# Patient Record
Sex: Female | Born: 1955 | Hispanic: No | Marital: Single | Smoking: Former smoker
Health system: Southern US, Community
[De-identification: ages and names within clinical notes are randomized; demographics above are authoritative.]

## PROBLEM LIST (undated history)

## (undated) DIAGNOSIS — R9431 Abnormal electrocardiogram [ECG] [EKG]: Secondary | ICD-10-CM

## (undated) DIAGNOSIS — R000 Tachycardia, unspecified: Secondary | ICD-10-CM

## (undated) DIAGNOSIS — F32A Depression, unspecified: Secondary | ICD-10-CM

## (undated) DIAGNOSIS — F329 Major depressive disorder, single episode, unspecified: Secondary | ICD-10-CM

## (undated) DIAGNOSIS — F319 Bipolar disorder, unspecified: Secondary | ICD-10-CM

## (undated) DIAGNOSIS — K219 Gastro-esophageal reflux disease without esophagitis: Secondary | ICD-10-CM

## (undated) DIAGNOSIS — I2510 Atherosclerotic heart disease of native coronary artery without angina pectoris: Secondary | ICD-10-CM

## (undated) DIAGNOSIS — G4700 Insomnia, unspecified: Secondary | ICD-10-CM

## (undated) DIAGNOSIS — E669 Obesity, unspecified: Secondary | ICD-10-CM

## (undated) DIAGNOSIS — M1990 Unspecified osteoarthritis, unspecified site: Secondary | ICD-10-CM

## (undated) DIAGNOSIS — J45909 Unspecified asthma, uncomplicated: Secondary | ICD-10-CM

## (undated) DIAGNOSIS — M7989 Other specified soft tissue disorders: Secondary | ICD-10-CM

## (undated) DIAGNOSIS — T7840XA Allergy, unspecified, initial encounter: Secondary | ICD-10-CM

## (undated) DIAGNOSIS — K8020 Calculus of gallbladder without cholecystitis without obstruction: Secondary | ICD-10-CM

## (undated) DIAGNOSIS — J449 Chronic obstructive pulmonary disease, unspecified: Secondary | ICD-10-CM

## (undated) DIAGNOSIS — M810 Age-related osteoporosis without current pathological fracture: Secondary | ICD-10-CM

## (undated) DIAGNOSIS — R011 Cardiac murmur, unspecified: Secondary | ICD-10-CM

## (undated) DIAGNOSIS — E785 Hyperlipidemia, unspecified: Secondary | ICD-10-CM

## (undated) DIAGNOSIS — M79673 Pain in unspecified foot: Secondary | ICD-10-CM

## (undated) DIAGNOSIS — F419 Anxiety disorder, unspecified: Secondary | ICD-10-CM

## (undated) DIAGNOSIS — D689 Coagulation defect, unspecified: Secondary | ICD-10-CM

## (undated) HISTORY — DX: Major depressive disorder, single episode, unspecified: F32.9

## (undated) HISTORY — DX: Cardiac murmur, unspecified: R01.1

## (undated) HISTORY — DX: Age-related osteoporosis without current pathological fracture: M81.0

## (undated) HISTORY — DX: Calculus of gallbladder without cholecystitis without obstruction: K80.20

## (undated) HISTORY — DX: Insomnia, unspecified: G47.00

## (undated) HISTORY — DX: Unspecified osteoarthritis, unspecified site: M19.90

## (undated) HISTORY — DX: Allergy, unspecified, initial encounter: T78.40XA

## (undated) HISTORY — PX: ULNAR NERVE REPAIR: SHX2594

## (undated) HISTORY — DX: Pain in unspecified foot: M79.673

## (undated) HISTORY — DX: Anxiety disorder, unspecified: F41.9

## (undated) HISTORY — DX: Obesity, unspecified: E66.9

## (undated) HISTORY — DX: Bipolar disorder, unspecified: F31.9

## (undated) HISTORY — DX: Other specified soft tissue disorders: M79.89

## (undated) HISTORY — PX: CHOLECYSTECTOMY: SHX55

## (undated) HISTORY — DX: Tachycardia, unspecified: R00.0

## (undated) HISTORY — PX: CARPAL TUNNEL RELEASE: SHX101

## (undated) HISTORY — DX: Depression, unspecified: F32.A

## (undated) HISTORY — DX: Abnormal electrocardiogram (ECG) (EKG): R94.31

## (undated) HISTORY — PX: ENDOMETRIAL ABLATION: SHX621

## (undated) HISTORY — DX: Gastro-esophageal reflux disease without esophagitis: K21.9

## (undated) HISTORY — PX: KNEE SURGERY: SHX244

## (undated) HISTORY — DX: Hyperlipidemia, unspecified: E78.5

## (undated) HISTORY — DX: Coagulation defect, unspecified: D68.9

## (undated) HISTORY — DX: Unspecified asthma, uncomplicated: J45.909

## (undated) HISTORY — PX: UPPER GASTROINTESTINAL ENDOSCOPY: SHX188

## (undated) HISTORY — PX: FOOT SURGERY: SHX648

## (undated) HISTORY — PX: LAPAROSCOPY: SHX197

## (undated) HISTORY — PX: TUBAL LIGATION: SHX77

## (undated) HISTORY — PX: BREAST BIOPSY: SHX20

## (undated) HISTORY — DX: Atherosclerotic heart disease of native coronary artery without angina pectoris: I25.10

## (undated) HISTORY — PX: COLONOSCOPY: SHX174

---
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
## 1998-08-25 ENCOUNTER — Other Ambulatory Visit: Admission: RE | Admit: 1998-08-25 | Discharge: 1998-08-25 | Payer: Self-pay | Admitting: Gynecology

## 1999-07-05 ENCOUNTER — Encounter: Admission: RE | Admit: 1999-07-05 | Discharge: 1999-07-05 | Payer: Self-pay | Admitting: Family Medicine

## 1999-07-05 ENCOUNTER — Encounter: Payer: Self-pay | Admitting: Family Medicine

## 2000-09-18 ENCOUNTER — Encounter: Payer: Self-pay | Admitting: Family Medicine

## 2000-09-18 ENCOUNTER — Other Ambulatory Visit: Admission: RE | Admit: 2000-09-18 | Discharge: 2000-09-18 | Payer: Self-pay | Admitting: Gynecology

## 2000-09-18 ENCOUNTER — Encounter: Admission: RE | Admit: 2000-09-18 | Discharge: 2000-09-18 | Payer: Self-pay | Admitting: Family Medicine

## 2000-11-25 ENCOUNTER — Encounter: Admission: RE | Admit: 2000-11-25 | Discharge: 2000-11-25 | Payer: Self-pay | Admitting: Family Medicine

## 2000-11-25 ENCOUNTER — Encounter: Payer: Self-pay | Admitting: Family Medicine

## 2000-12-21 ENCOUNTER — Emergency Department (HOSPITAL_COMMUNITY): Admission: EM | Admit: 2000-12-21 | Discharge: 2000-12-21 | Payer: Self-pay

## 2001-01-06 ENCOUNTER — Ambulatory Visit (HOSPITAL_COMMUNITY): Admission: RE | Admit: 2001-01-06 | Discharge: 2001-01-06 | Payer: Self-pay | Admitting: Pulmonary Disease

## 2001-01-06 ENCOUNTER — Encounter: Payer: Self-pay | Admitting: Pulmonary Disease

## 2001-01-16 ENCOUNTER — Encounter: Admission: RE | Admit: 2001-01-16 | Discharge: 2001-01-16 | Payer: Self-pay | Admitting: Family Medicine

## 2001-01-16 ENCOUNTER — Encounter: Payer: Self-pay | Admitting: Family Medicine

## 2001-01-17 ENCOUNTER — Encounter: Payer: Self-pay | Admitting: Family Medicine

## 2001-01-17 ENCOUNTER — Encounter: Admission: RE | Admit: 2001-01-17 | Discharge: 2001-01-17 | Payer: Self-pay | Admitting: Family Medicine

## 2001-01-31 ENCOUNTER — Encounter (INDEPENDENT_AMBULATORY_CARE_PROVIDER_SITE_OTHER): Payer: Self-pay | Admitting: *Deleted

## 2001-01-31 ENCOUNTER — Ambulatory Visit (HOSPITAL_COMMUNITY): Admission: RE | Admit: 2001-01-31 | Discharge: 2001-02-01 | Payer: Self-pay | Admitting: General Surgery

## 2001-12-09 ENCOUNTER — Encounter: Payer: Self-pay | Admitting: Family Medicine

## 2001-12-09 ENCOUNTER — Encounter: Admission: RE | Admit: 2001-12-09 | Discharge: 2001-12-09 | Payer: Self-pay | Admitting: Family Medicine

## 2001-12-29 ENCOUNTER — Encounter: Payer: Self-pay | Admitting: Pulmonary Disease

## 2002-03-24 ENCOUNTER — Encounter: Payer: Self-pay | Admitting: Family Medicine

## 2002-03-24 ENCOUNTER — Encounter: Admission: RE | Admit: 2002-03-24 | Discharge: 2002-03-24 | Payer: Self-pay | Admitting: Family Medicine

## 2002-04-17 ENCOUNTER — Ambulatory Visit (HOSPITAL_COMMUNITY): Admission: RE | Admit: 2002-04-17 | Discharge: 2002-04-17 | Payer: Self-pay | Admitting: Pulmonary Disease

## 2002-04-17 ENCOUNTER — Encounter: Payer: Self-pay | Admitting: Pulmonary Disease

## 2002-05-01 ENCOUNTER — Ambulatory Visit (HOSPITAL_COMMUNITY): Admission: RE | Admit: 2002-05-01 | Discharge: 2002-05-01 | Payer: Self-pay | Admitting: *Deleted

## 2002-05-01 ENCOUNTER — Encounter (INDEPENDENT_AMBULATORY_CARE_PROVIDER_SITE_OTHER): Payer: Self-pay | Admitting: Specialist

## 2003-03-08 ENCOUNTER — Other Ambulatory Visit: Admission: RE | Admit: 2003-03-08 | Discharge: 2003-03-08 | Payer: Self-pay | Admitting: Gynecology

## 2003-03-08 ENCOUNTER — Encounter: Payer: Self-pay | Admitting: Gynecology

## 2003-03-08 ENCOUNTER — Encounter: Admission: RE | Admit: 2003-03-08 | Discharge: 2003-03-08 | Payer: Self-pay | Admitting: Gynecology

## 2003-04-18 ENCOUNTER — Emergency Department (HOSPITAL_COMMUNITY): Admission: EM | Admit: 2003-04-18 | Discharge: 2003-04-18 | Payer: Self-pay | Admitting: Emergency Medicine

## 2003-05-17 ENCOUNTER — Encounter (INDEPENDENT_AMBULATORY_CARE_PROVIDER_SITE_OTHER): Payer: Self-pay | Admitting: Specialist

## 2003-05-17 ENCOUNTER — Ambulatory Visit (HOSPITAL_COMMUNITY): Admission: RE | Admit: 2003-05-17 | Discharge: 2003-05-17 | Payer: Self-pay | Admitting: Gynecology

## 2003-05-17 ENCOUNTER — Ambulatory Visit (HOSPITAL_BASED_OUTPATIENT_CLINIC_OR_DEPARTMENT_OTHER): Admission: RE | Admit: 2003-05-17 | Discharge: 2003-05-17 | Payer: Self-pay | Admitting: Gynecology

## 2003-05-24 ENCOUNTER — Encounter: Admission: RE | Admit: 2003-05-24 | Discharge: 2003-05-24 | Payer: Self-pay | Admitting: Pulmonary Disease

## 2003-10-21 ENCOUNTER — Emergency Department (HOSPITAL_COMMUNITY): Admission: EM | Admit: 2003-10-21 | Discharge: 2003-10-21 | Payer: Self-pay | Admitting: Emergency Medicine

## 2004-04-07 ENCOUNTER — Other Ambulatory Visit: Admission: RE | Admit: 2004-04-07 | Discharge: 2004-04-07 | Payer: Self-pay | Admitting: Obstetrics and Gynecology

## 2004-05-17 ENCOUNTER — Ambulatory Visit: Payer: Self-pay | Admitting: Pulmonary Disease

## 2004-06-05 ENCOUNTER — Ambulatory Visit: Payer: Self-pay | Admitting: Critical Care Medicine

## 2004-06-13 ENCOUNTER — Ambulatory Visit: Payer: Self-pay | Admitting: Pulmonary Disease

## 2004-06-21 ENCOUNTER — Ambulatory Visit: Payer: Self-pay | Admitting: Pulmonary Disease

## 2004-06-27 ENCOUNTER — Ambulatory Visit: Payer: Self-pay | Admitting: Pulmonary Disease

## 2004-07-18 ENCOUNTER — Ambulatory Visit: Payer: Self-pay | Admitting: Pulmonary Disease

## 2004-09-21 ENCOUNTER — Encounter: Admission: RE | Admit: 2004-09-21 | Discharge: 2004-09-21 | Payer: Self-pay | Admitting: Internal Medicine

## 2004-10-17 ENCOUNTER — Encounter: Admission: RE | Admit: 2004-10-17 | Discharge: 2004-10-17 | Payer: Self-pay | Admitting: Internal Medicine

## 2005-02-05 ENCOUNTER — Ambulatory Visit: Payer: Self-pay | Admitting: Family Medicine

## 2005-02-06 ENCOUNTER — Ambulatory Visit: Payer: Self-pay | Admitting: *Deleted

## 2005-04-19 ENCOUNTER — Ambulatory Visit: Payer: Self-pay | Admitting: Family Medicine

## 2005-06-19 ENCOUNTER — Ambulatory Visit: Payer: Self-pay | Admitting: Internal Medicine

## 2005-09-07 ENCOUNTER — Other Ambulatory Visit: Admission: RE | Admit: 2005-09-07 | Discharge: 2005-09-07 | Payer: Self-pay | Admitting: Obstetrics and Gynecology

## 2005-10-19 ENCOUNTER — Encounter: Admission: RE | Admit: 2005-10-19 | Discharge: 2005-10-19 | Payer: Self-pay | Admitting: Obstetrics and Gynecology

## 2006-03-13 ENCOUNTER — Observation Stay (HOSPITAL_COMMUNITY): Admission: EM | Admit: 2006-03-13 | Discharge: 2006-03-15 | Payer: Self-pay | Admitting: Emergency Medicine

## 2006-03-14 ENCOUNTER — Encounter: Payer: Self-pay | Admitting: Cardiology

## 2006-03-14 ENCOUNTER — Ambulatory Visit: Payer: Self-pay | Admitting: Cardiology

## 2006-03-19 ENCOUNTER — Ambulatory Visit: Payer: Self-pay | Admitting: *Deleted

## 2006-03-19 ENCOUNTER — Ambulatory Visit: Payer: Self-pay | Admitting: Pulmonary Disease

## 2006-03-27 ENCOUNTER — Ambulatory Visit: Payer: Self-pay | Admitting: Pulmonary Disease

## 2006-04-02 ENCOUNTER — Ambulatory Visit: Payer: Self-pay | Admitting: Internal Medicine

## 2006-04-08 ENCOUNTER — Ambulatory Visit: Payer: Self-pay | Admitting: Pulmonary Disease

## 2006-04-22 ENCOUNTER — Ambulatory Visit: Payer: Self-pay | Admitting: Pulmonary Disease

## 2006-04-25 ENCOUNTER — Ambulatory Visit: Payer: Self-pay | Admitting: Cardiovascular Disease

## 2006-04-26 ENCOUNTER — Encounter: Payer: Self-pay | Admitting: Pulmonary Disease

## 2006-05-01 ENCOUNTER — Ambulatory Visit: Payer: Self-pay | Admitting: Pulmonary Disease

## 2006-05-13 ENCOUNTER — Ambulatory Visit: Payer: Self-pay

## 2006-05-16 ENCOUNTER — Ambulatory Visit: Payer: Self-pay | Admitting: Cardiovascular Disease

## 2006-05-23 ENCOUNTER — Encounter: Payer: Self-pay | Admitting: Pulmonary Disease

## 2006-06-03 ENCOUNTER — Encounter: Payer: Self-pay | Admitting: Pulmonary Disease

## 2006-06-06 ENCOUNTER — Ambulatory Visit: Payer: Self-pay | Admitting: Pulmonary Disease

## 2006-07-18 ENCOUNTER — Ambulatory Visit: Payer: Self-pay | Admitting: Pulmonary Disease

## 2006-09-17 ENCOUNTER — Ambulatory Visit: Payer: Self-pay | Admitting: Pulmonary Disease

## 2007-03-03 DIAGNOSIS — J45909 Unspecified asthma, uncomplicated: Secondary | ICD-10-CM | POA: Insufficient documentation

## 2007-03-03 DIAGNOSIS — H6690 Otitis media, unspecified, unspecified ear: Secondary | ICD-10-CM | POA: Insufficient documentation

## 2007-03-03 DIAGNOSIS — J329 Chronic sinusitis, unspecified: Secondary | ICD-10-CM | POA: Insufficient documentation

## 2007-04-03 DIAGNOSIS — R0789 Other chest pain: Secondary | ICD-10-CM | POA: Insufficient documentation

## 2007-04-03 DIAGNOSIS — J383 Other diseases of vocal cords: Secondary | ICD-10-CM | POA: Insufficient documentation

## 2007-04-03 DIAGNOSIS — K219 Gastro-esophageal reflux disease without esophagitis: Secondary | ICD-10-CM | POA: Insufficient documentation

## 2007-04-18 ENCOUNTER — Ambulatory Visit: Payer: Self-pay | Admitting: Pulmonary Disease

## 2007-05-07 ENCOUNTER — Telehealth (INDEPENDENT_AMBULATORY_CARE_PROVIDER_SITE_OTHER): Payer: Self-pay | Admitting: *Deleted

## 2007-05-20 ENCOUNTER — Telehealth (INDEPENDENT_AMBULATORY_CARE_PROVIDER_SITE_OTHER): Payer: Self-pay | Admitting: *Deleted

## 2007-05-22 ENCOUNTER — Telehealth (INDEPENDENT_AMBULATORY_CARE_PROVIDER_SITE_OTHER): Payer: Self-pay | Admitting: *Deleted

## 2007-05-30 ENCOUNTER — Ambulatory Visit: Payer: Self-pay | Admitting: Pulmonary Disease

## 2008-03-10 ENCOUNTER — Encounter: Admission: RE | Admit: 2008-03-10 | Discharge: 2008-03-10 | Payer: Self-pay | Admitting: Nurse Practitioner

## 2008-05-04 ENCOUNTER — Ambulatory Visit: Payer: Self-pay | Admitting: Cardiovascular Disease

## 2008-05-04 ENCOUNTER — Emergency Department (HOSPITAL_COMMUNITY): Admission: EM | Admit: 2008-05-04 | Discharge: 2008-05-04 | Payer: Self-pay | Admitting: Emergency Medicine

## 2008-10-26 ENCOUNTER — Emergency Department (HOSPITAL_COMMUNITY): Admission: EM | Admit: 2008-10-26 | Discharge: 2008-10-26 | Payer: Self-pay | Admitting: Emergency Medicine

## 2009-02-04 ENCOUNTER — Ambulatory Visit: Payer: Self-pay | Admitting: *Deleted

## 2009-02-04 ENCOUNTER — Inpatient Hospital Stay (HOSPITAL_COMMUNITY): Admission: AD | Admit: 2009-02-04 | Discharge: 2009-02-07 | Payer: Self-pay | Admitting: *Deleted

## 2009-03-14 ENCOUNTER — Encounter: Admission: RE | Admit: 2009-03-14 | Discharge: 2009-03-14 | Payer: Self-pay | Admitting: Internal Medicine

## 2009-04-07 ENCOUNTER — Ambulatory Visit: Payer: Self-pay | Admitting: Pulmonary Disease

## 2009-04-07 ENCOUNTER — Ambulatory Visit: Payer: Self-pay | Admitting: Thoracic Surgery

## 2009-04-07 ENCOUNTER — Inpatient Hospital Stay (HOSPITAL_COMMUNITY): Admission: AD | Admit: 2009-04-07 | Discharge: 2009-04-23 | Payer: Self-pay | Admitting: Thoracic Surgery

## 2009-04-07 ENCOUNTER — Encounter: Payer: Self-pay | Admitting: Emergency Medicine

## 2009-04-19 ENCOUNTER — Encounter: Payer: Self-pay | Admitting: Thoracic Surgery

## 2009-04-19 ENCOUNTER — Encounter: Payer: Self-pay | Admitting: Pulmonary Disease

## 2009-04-27 ENCOUNTER — Encounter: Admission: RE | Admit: 2009-04-27 | Discharge: 2009-04-27 | Payer: Self-pay | Admitting: Thoracic Surgery

## 2009-04-27 ENCOUNTER — Ambulatory Visit: Payer: Self-pay | Admitting: Thoracic Surgery

## 2009-05-04 ENCOUNTER — Encounter: Admission: RE | Admit: 2009-05-04 | Discharge: 2009-05-04 | Payer: Self-pay | Admitting: Thoracic Surgery

## 2009-05-04 ENCOUNTER — Ambulatory Visit: Payer: Self-pay | Admitting: Thoracic Surgery

## 2009-05-06 ENCOUNTER — Ambulatory Visit: Payer: Self-pay | Admitting: Pulmonary Disease

## 2009-06-01 ENCOUNTER — Ambulatory Visit: Payer: Self-pay | Admitting: Thoracic Surgery

## 2009-06-01 ENCOUNTER — Encounter: Admission: RE | Admit: 2009-06-01 | Discharge: 2009-06-01 | Payer: Self-pay | Admitting: Thoracic Surgery

## 2009-08-02 ENCOUNTER — Telehealth (INDEPENDENT_AMBULATORY_CARE_PROVIDER_SITE_OTHER): Payer: Self-pay | Admitting: *Deleted

## 2009-10-27 ENCOUNTER — Ambulatory Visit: Payer: Self-pay | Admitting: Pulmonary Disease

## 2009-10-27 DIAGNOSIS — J209 Acute bronchitis, unspecified: Secondary | ICD-10-CM | POA: Insufficient documentation

## 2010-04-14 ENCOUNTER — Telehealth (INDEPENDENT_AMBULATORY_CARE_PROVIDER_SITE_OTHER): Payer: Self-pay | Admitting: *Deleted

## 2010-04-26 ENCOUNTER — Ambulatory Visit: Payer: Self-pay | Admitting: Pulmonary Disease

## 2010-04-26 DIAGNOSIS — R0602 Shortness of breath: Secondary | ICD-10-CM | POA: Insufficient documentation

## 2010-06-07 ENCOUNTER — Ambulatory Visit: Payer: Self-pay | Admitting: Pulmonary Disease

## 2010-07-18 NOTE — Assessment & Plan Note (Signed)
Summary: acute sick visit for asthma, acute bronchitis   Copy to:  Brittany [name]  CC:  Pt is here for a f/u appt.  Pt c/o increased sob with exertion and at rest.  Pt also c/o coughing up green sputum.  Pt states she stopped taking Qvar because it is a "steriod and she can't take d/t increasing bipolar sx." .  History of Present Illness: The pt comes in today for an acute sick visit related to worsening sob.  She has known asthma, but also significant VCD.  She also has a h/o iatrogenic ptx that required vats for resolution.  She had been doing well on her qvar, but recently discontinued because she thought it was triggering a "rage reaction" with her bipolar illness.  She saw that it was a "steroid" and blamed her decompensation on the qvar.  Her breathing has since worsened, and she feels that she is getting a "chest cold".  She has chest congestion, and is starting to bring up purulent mucus.  Current Medications (verified): 1)  Therapeutic Multivitamin   Tabs (Multiple Vitamin) .Marland Kitchen.. 1 By Mouth Once Daily 2)  Vitamin B .... Take 1 Tablet By Mouth Once A Day 3)  Fish Oil .... Take 1 Tablet By Mouth Once A Day 4)  Seroquel 100 Mg Tabs (Quetiapine Fumarate) .... Take 2 To 3 Tabs By Mouth At Bedtime 5)  Lithium  (Unsure of Dosage) .... Take 1 Tablet By Mouth Two Times A Day 6)  Wellbutrin Xr 175mg  .... Take 1 Tablet By Mouth Two Times A Day 7)  Calcium .... Take 1 Tablet By Mouth Once A Day 8)  Xanax 1 Mg Tabs (Alprazolam) .... Take 1 Tablet By Mouth Four Times A Day 9)  Tramadol  (Unsure of Dosage) .... Take By Mouth As Needed 10)  Antihistamine .... As Needed 11)  Vitamin D3 .... Take 1 Tablet By Mouth Once A Day 12)  Proair Hfa 108 (90 Base) Mcg/act  Aers (Albuterol Sulfate) .... 2 Puffs Every 4-6 Hours As Needed 13)  Diazepam .... Take 1 Tablet By Mouth Once A Day  Allergies (verified): 1)  ! Asa 2)  ! Septra 3)  ! Vioxx 4)  Aspirin 5)  Septra 6)  Vioxx  Review of Systems       The  patient complains of shortness of breath with activity, shortness of breath at rest, productive cough, non-productive cough, anxiety, depression, and change in color of mucus.  The patient denies coughing up blood, chest pain, irregular heartbeats, acid heartburn, indigestion, loss of appetite, weight change, abdominal pain, difficulty swallowing, sore throat, tooth/dental problems, headaches, nasal congestion/difficulty breathing through nose, sneezing, itching, ear ache, hand/feet swelling, joint stiffness or pain, rash, and fever.    Vital Signs:  Patient profile:   55 year old female Height:      62 inches Weight:      201 pounds BMI:     36.90 O2 Sat:      95 % on Room air Temp:     97.7 degrees F oral Pulse rate:   97 / minute BP sitting:   120 / 70  (left arm) Cuff size:   large  Vitals Entered By: Arman Filter LPN (Oct 27, 2009 9:16 AM)  O2 Flow:  Room air CC: Pt is here for a f/u appt.  Pt c/o increased sob with exertion and at rest.  Pt also c/o coughing up green sputum.  Pt states she stopped taking Qvar because it  is a "steriod and she can't take d/t increasing bipolar sx."  Comments Medications reviewed with patient Arman Filter LPN  Oct 27, 2009 9:16 AM    Physical Exam  General:  obese female in nad Nose:  no purulence or drainage noted. Lungs:  a few rhonchi, but no wheezing. Heart:  rrr Extremities:  mild edema, but no cyanosis Neurologic:  alert and oriented, moves all 4.   Impression & Recommendations:  Problem # 1:  ASTHMA (ICD-493.90) the pt is having increased sob due to medical noncompliance.  I have explained to her that ICS have very little if any systemic absorption, and is not causing her bipolar disease to flare.  It is making her asthma worse being off qvar, and I have asked her to restart.  I have also asked her to get with her psychiatrist if having a bipolar flare.  Problem # 2:  ACUTE BRONCHITIS (ICD-466.0)  the pt is having increased  congestion and cough with early purulent mucus.  Will emperically start on abx for this.  Would like to stay away from systemic steroids given her state of mind right now.  Medications Added to Medication List This Visit: 1)  Seroquel 100 Mg Tabs (Quetiapine fumarate) .... Take 2 to 3 tabs by mouth at bedtime 2)  Wellbutrin Xr 175mg   .... Take 1 tablet by mouth two times a day 3)  Antihistamine  .... As needed 4)  Diazepam  .... Take 1 tablet by mouth once a day 5)  Cefdinir 300 Mg Caps (Cefdinir) .... 2 each am for 5 days. 6)  Qvar 80 Mcg/act Aers (Beclomethasone dipropionate) .... Two  puffs twice daily  Other Orders: Est. Patient Level IV (66440)  Patient Instructions: 1)  get back on qvar 2 puffs am and pm....rinse mouth 2)  continue to use proair as needed. 3)  work on weight loss. 4)  will treat with omnicef 300mg  2 each am for 5 days. 5)  followup with me in 6mos.  Prescriptions: QVAR 80 MCG/ACT  AERS (BECLOMETHASONE DIPROPIONATE) Two  puffs twice daily  #1 x 6   Entered and Authorized by:   Barbaraann Share MD   Signed by:   Barbaraann Share MD on 10/27/2009   Method used:   Print then Give to Patient   RxID:   [id number] CEFDINIR 300 MG CAPS (CEFDINIR) 2 each am for 5 days.  #10 x 0   Entered and Authorized by:   Barbaraann Share MD   Signed by:   Barbaraann Share MD on 10/27/2009   Method used:   Print then Give to Patient   RxID:   [id number]    Immunization History:  Influenza Immunization History:    Influenza:  historical (03/18/2009)  Pneumovax Immunization History:    Pneumovax:  historical (06/18/2005)

## 2010-07-18 NOTE — Progress Notes (Signed)
Summary: Records request from [facility].  Request for records received from [facility] Request forwarded to [employer]. [name]  August 02, 2009 4:26 PM

## 2010-07-18 NOTE — Assessment & Plan Note (Signed)
Summary: rov for asthma, VCD    Visit Type:  Follow-up Copy to:  [facility]  CC:  6 month follow up. Pt statesher breathing has been "terrible". Pt c/o dry cough, wheezing, and chest pain. pt quit smoking 1998. Marland Kitchen  History of Present Illness: the pt comes in today for f/u of her known asthma and also VCD.  She c/o worsening sob, along with dry cough and audible "wheezing".  She denies any purulence.  She has not been compliant with symbicort due to financial issues, and will need to be referred to the pt assistance program.  She has also been having issues with postnasal drip and GERD.  Current Medications (verified): 1)  Therapeutic Multivitamin   Tabs (Multiple Vitamin) .Marland Kitchen.. 1 By Mouth Once Daily 2)  Fish Oil .... Take 1 Tablet By Mouth Once A Day 3)  Seroquel 50 Mg Tabs (Quetiapine Fumarate) .... Take 1 Tablet At Bedtime 4)  Lithium Carbonate 150 Mg Caps (Lithium Carbonate) .... One Tablet Two Times A Day 5)  Calcium .... Take 1 Tablet By Mouth Once A Day 6)  Xanax 1 Mg Tabs (Alprazolam) .... Take 1 Tablet By Mouth Four Times A Day 7)  Antihistamine .... As Needed 8)  Vitamin D3 .... Take 2 Tablet By Mouth Once A Day 9)  Ventolin Hfa 108 (90 Base) Mcg/act Aers (Albuterol Sulfate) .... 2 Puffs Every 4-6 Hrs As Needed 10)  Diazepam .... Take 1 Tablet By Mouth Once A Day 11)  Dextroamphetamine Sulfate 5 Mg Tabs (Dextroamphetamine Sulfate) .... One Tablet Three Times A Day 12)  Corte B Plax .... One Tablet Two Times A Day 13)  Symbicort 160-4.5 Mcg/act Aero (Budesonide-Formoterol Fumarate) .... 2 Puffs Two Times A Day  Allergies: 1)  ! Asa 2)  ! Septra 3)  ! Vioxx  Past History:  Past medical, surgical, family and social histories (including risk factors) reviewed, and no changes noted (except as noted below).  Past Medical History: Reviewed history from 03/03/2007 and no changes required. OTITIS MEDIA (ICD-382.9) Hx of SINUSITIS (ICD-473.9)   Asthma  Past Surgical  History: Reviewed history from 03/03/2007 and no changes required. s/p GB surgery s/p c/s X2 G3P2 s/p BTL s/p R  knee surgery age 1 multiple caries  Family History: Reviewed history and no changes required.  Social History: Reviewed history and no changes required.  Review of Systems       The patient complains of shortness of breath with activity, shortness of breath at rest, non-productive cough, acid heartburn, indigestion, loss of appetite, difficulty swallowing, nasal congestion/difficulty breathing through nose, hand/feet swelling, and joint stiffness or pain.  The patient denies productive cough, coughing up blood, chest pain, irregular heartbeats, weight change, abdominal pain, sore throat, tooth/dental problems, headaches, sneezing, itching, ear ache, anxiety, depression, rash, change in color of mucus, and fever.    Vital Signs:  Patient profile:   55 year old female Height:      62 inches Weight:      204.50 pounds BMI:     37.54 O2 Sat:      95 % on Room air Temp:     98.6 degrees F oral Pulse rate:   104 / minute Cuff size:   large  Vitals Entered By: Carver Fila (April 26, 2010 2:15 PM)  O2 Flow:  Room air CC: 6 month follow up. Pt statesher breathing has been "terrible". Pt c/o dry cough, wheezing, chest pain. pt quit smoking 1998.  Comments meds and  allergies updated Phone number updated Carver Fila  April 26, 2010 2:15 PM    Physical Exam  General:  obese female in nad Nose:  no purulence or discharge noted. Mouth:  clear, no exudates or lesions. Lungs:  good airflow, no wheezing or rhonchi +upper airway pseudowheezing. Heart:  rrr, no mrg Extremities:  minimal edema, no cyanosis  Neurologic:  alert and oriented, moves all 4.   Impression & Recommendations:  Problem # 1:  DYSPNEA (ICD-786.05) the pt is having worsening sob that I suspect is due to VCD.  She has worsening GERD and postnasal drip, and is describing classic ua pseudowheezing.   She has good airflow on exam today and no true wheezing, but does need to get back on her symbicort.  Will check a cxr to r/o other causes of dyspnea, and start on PPI for GERD.  I have also asked her to stop fish oil for the next 8 weeks.  Medications Added to Medication List This Visit: 1)  Seroquel 50 Mg Tabs (Quetiapine fumarate) .... Take 1 tablet at bedtime 2)  Lithium Carbonate 150 Mg Caps (Lithium carbonate) .... One tablet two times a day 3)  Vitamin D3  .... Take 2 tablet by mouth once a day 4)  Ventolin Hfa 108 (90 Base) Mcg/act Aers (Albuterol sulfate) .... 2 puffs every 4-6 hrs as needed 5)  Dextroamphetamine Sulfate 5 Mg Tabs (Dextroamphetamine sulfate) .... One tablet three times a day 6)  Corte B Plax  .... One tablet two times a day 7)  Symbicort 160-4.5 Mcg/act Aero (Budesonide-formoterol fumarate) .... 2 puffs two times a day  Other Orders: Est. Patient Level IV (16606) T-2 View CXR (71020TC)  Patient Instructions: 1)  you need to stay on symbicort religiously every day am and pm.  Will refer you to the patient assistance program 2)  trial of chlorpheniramine 8mg  one each night at bedtime for postnasal drip 3)  stop fish oil for next 8 weeks...this can make reflux worse 4)  will start on omeprazole 40mg  one each am for reflux 5)  will check cxr today 6)  followup with me in 6weeks   Immunization History:  Influenza Immunization History:    Influenza:  historical (03/18/2010)

## 2010-07-18 NOTE — Progress Notes (Signed)
  Phone Note Other Incoming   Request: Send information Summary of Call: Request for records received from [facility]. Request forwarded to [employer].

## 2010-09-20 LAB — BASIC METABOLIC PANEL
CO2: 28 mEq/L (ref 19–32)
Calcium: 8.6 mg/dL (ref 8.4–10.5)
Calcium: 9.5 mg/dL (ref 8.4–10.5)
GFR calc Af Amer: >60 mL/min (ref >60)
GFR calc Af Amer: >60 mL/min (ref >60)
GFR calc non Af Amer: >60 mL/min (ref >60)
Glucose, Bld: 88 mg/dL (ref 70–99)
Potassium: 3.7 mEq/L (ref 3.5–5.1)
Sodium: 134 mEq/L — ABNORMAL LOW (ref 135–145)
Sodium: 139 mEq/L (ref 135–145)

## 2010-09-20 LAB — COMPREHENSIVE METABOLIC PANEL
ALT: 43 U/L — ABNORMAL HIGH (ref 0–35)
AST: 27 U/L (ref 0–37)
Albumin: 3.2 g/dL — ABNORMAL LOW (ref 3.5–5.2)
Alkaline Phosphatase: 58 U/L (ref 39–117)
BUN: 4 mg/dL — ABNORMAL LOW (ref 6–23)
Chloride: 102 mEq/L (ref 96–112)
GFR calc Af Amer: >60 mL/min (ref >60)
Total Bilirubin: 0.4 mg/dL (ref 0.3–1.2)
Total Protein: 5.7 g/dL — ABNORMAL LOW (ref 6.0–8.3)

## 2010-09-20 LAB — CBC
Hemoglobin: 12.8 g/dL (ref 12.0–15.0)
Hemoglobin: 13.1 g/dL (ref 12.0–15.0)
Hemoglobin: 14.8 g/dL (ref 12.0–15.0)
MCHC: 34.9 g/dL (ref 30.0–36.0)
MCHC: 35 g/dL (ref 30.0–36.0)
MCV: 94.4 fL (ref 78.0–100.0)
RBC: 3.88 MIL/uL (ref 3.87–5.11)
RBC: 3.99 MIL/uL (ref 3.87–5.11)
RBC: 4.55 MIL/uL (ref 3.87–5.11)
RDW: 12.9 % (ref 11.5–15.5)
WBC: 5.7 10*3/uL (ref 4.0–10.5)

## 2010-09-21 LAB — COMPREHENSIVE METABOLIC PANEL
ALT: 23 U/L (ref 0–35)
Alkaline Phosphatase: 62 U/L (ref 39–117)
BUN: 10 mg/dL (ref 6–23)
CO2: 21 mEq/L (ref 19–32)
Calcium: 8.8 mg/dL (ref 8.4–10.5)
GFR calc non Af Amer: >60 mL/min (ref >60)
Glucose, Bld: 164 mg/dL — ABNORMAL HIGH (ref 70–99)
Total Protein: 6.5 g/dL (ref 6.0–8.3)

## 2010-09-21 LAB — POCT I-STAT, CHEM 8
BUN: 13 mg/dL (ref 6–23)
Creatinine, Ser: 0.7 mg/dL (ref 0.4–1.2)
Glucose, Bld: 90 mg/dL (ref 70–99)
Hemoglobin: 17 g/dL — ABNORMAL HIGH (ref 12.0–15.0)
Potassium: 4.5 mEq/L (ref 3.5–5.1)
Sodium: 138 mEq/L (ref 135–145)

## 2010-09-21 LAB — BASIC METABOLIC PANEL
CO2: 31 mEq/L (ref 19–32)
Calcium: 8.4 mg/dL (ref 8.4–10.5)
Chloride: 100 mEq/L (ref 96–112)
Creatinine, Ser: 0.66 mg/dL (ref 0.4–1.2)
GFR calc Af Amer: >60 mL/min (ref >60)
Sodium: 138 mEq/L (ref 135–145)

## 2010-09-21 LAB — CBC
Hemoglobin: 13.9 g/dL (ref 12.0–15.0)
MCHC: 34.4 g/dL (ref 30.0–36.0)
MCHC: 34.5 g/dL (ref 30.0–36.0)
MCV: 94 fL (ref 78.0–100.0)
Platelets: 308 10*3/uL (ref 150–400)
RBC: 4.28 MIL/uL (ref 3.87–5.11)
RDW: 13.1 % (ref 11.5–15.5)
WBC: 4.8 10*3/uL (ref 4.0–10.5)

## 2010-09-23 LAB — COMPREHENSIVE METABOLIC PANEL
ALT: 47 U/L — ABNORMAL HIGH (ref 0–35)
Albumin: 4.1 g/dL (ref 3.5–5.2)
BUN: 16 mg/dL (ref 6–23)
Calcium: 9.5 mg/dL (ref 8.4–10.5)
Glucose, Bld: 83 mg/dL (ref 70–99)
Sodium: 142 mEq/L (ref 135–145)
Total Protein: 6.9 g/dL (ref 6.0–8.3)

## 2010-09-23 LAB — URINALYSIS, ROUTINE W REFLEX MICROSCOPIC
Bilirubin Urine: NEGATIVE
Nitrite: NEGATIVE
Specific Gravity, Urine: 1.014 (ref 1.005–1.030)
Urobilinogen, UA: 0.2 mg/dL (ref 0.0–1.0)
pH: 7 (ref 5.0–8.0)

## 2010-09-23 LAB — BENZODIAZEPINE, QUANTITATIVE, URINE
Flurazepam GC/MS Conf: NEGATIVE
Nordiazepam GC/MS Conf: NEGATIVE
Oxazepam GC/MS Conf: NEGATIVE

## 2010-09-23 LAB — URINE MICROSCOPIC-ADD ON

## 2010-09-23 LAB — DRUGS OF ABUSE SCREEN W/O ALC, ROUTINE URINE
Benzodiazepines.: POSITIVE — AB
Creatinine,U: 58.3 mg/dL
Marijuana Metabolite: NEGATIVE
Opiate Screen, Urine: NEGATIVE
Propoxyphene: NEGATIVE

## 2010-09-23 LAB — CBC
Hemoglobin: 14.5 g/dL (ref 12.0–15.0)
MCHC: 34.4 g/dL (ref 30.0–36.0)
Platelets: 358 10*3/uL (ref 150–400)
RDW: 13.3 % (ref 11.5–15.5)

## 2010-09-23 LAB — TSH: TSH: 4.307 u[IU]/mL (ref 0.350–4.500)

## 2010-09-26 LAB — CBC
HCT: 47.1 % — ABNORMAL HIGH (ref 36.0–46.0)
MCV: 93.1 fL (ref 78.0–100.0)
RBC: 5.06 MIL/uL (ref 3.87–5.11)
WBC: 5.1 10*3/uL (ref 4.0–10.5)

## 2010-09-26 LAB — BASIC METABOLIC PANEL
CO2: 25 mEq/L (ref 19–32)
Chloride: 108 mEq/L (ref 96–112)
GFR calc Af Amer: >60 mL/min (ref >60)
Potassium: 3.7 mEq/L (ref 3.5–5.1)

## 2010-10-31 NOTE — Letter (Signed)
May 04, 2009   Barbaraann Share, MD,FCCP  [address]  [place], [place] [address]   Re:  IMUNIQUE, SAMAD               DOB:  Jan 08, 1956   Dear Mellody Dance:   I saw the patient today again in follow up for her right VATS for  closure of an air leak.  We had a long discussion on the mechanism of  the injury.  Her chest x-ray looks good.  Her incisions are healing  well.  One chest tube site is clean, but still has way to go to close  completely, but it has minimal drainage.  She is doing well overall and  her pain is decreasing although she still has some dysesthesias  anteriorly.  Her blood pressure was 104/76, pulse 100, respirations 18,  and sats were 90%.  Her chest x-ray showed her lung completely expanded.  She will be seeing you in about a week and I gave her a copy of the  chest x-rays to bring to you.  I also gave her a copy of her biopsy,  where we did which does show emphysema, so I feel that that is one  reason why her leak did not close because of her emphysema in the apex  of her lung.  I appreciate the opportunity of seeing the patient.  I  will see her back again in 4 weeks with a chest x-ray.   Sincerely,   Ines Bloomer, M.D.  Electronically Signed   DPB/MEDQ  D:  05/04/2009  T:  05/05/2009  Job:  [id number]   cc:   Deidre Ala, M.D.

## 2010-10-31 NOTE — Consult Note (Signed)
NAMEDEVANY, AJA NO.:  [id number]   MEDICAL RECORD NO.:  [id number]          PATIENT TYPE:  EMS   LOCATION:  MAJO                         FACILITY:  [facility]   PHYSICIAN:  Verne Carrow, MDDATE OF BIRTH:  01-07-1956   DATE OF CONSULTATION:  DATE OF DISCHARGE:                                 CONSULTATION   PRIMARY PHYSICIAN:  Dr. Tomi Bamberger in [place] at [facility].   PSYCHIATRIST:  Milagros Evener, MD   CARDIOLOGIST:  Noralyn Pick. Eden Emms, MD, [facility], with [facility].   REASON FOR CONSULT:  A new right bundle-branch block.   CHIEF COMPLAINT:  Weakness, dizziness, and nausea.   HISTORY OF PRESENT ILLNESS:  The patient is a 55 year old female with  past medical history of asthma, vocal cord dysfunction, borderline  hyperlipidemia, and bipolar disorder presenting to the emergency room  this morning complaining of palpitations, dizziness, and nausea.  She  notes that she had also been accumulating some fluid over the last week  and started taking over-the-counter diuretic called Diurex which is  actually caffeine and had been taking it a couple of times a day for the  last 3-4 days.  She was unable to sleep last night and early this  morning took her new medication for bipolar disorder called Saphris  along with trazodone for the first time to help her go to sleep.  After  approximately 30 minutes, she became dizzy, nauseated, and had  palpitations and called EMS.  She denied any chest pain, shortness of  breath, orthopnea, and describes her dizziness as both, vertigo and  presyncope.  Her symptoms are now improved in the emergency room.   PAST MEDICAL HISTORY:  1. Asthma with FEV1 68% with a 22% response to bronchodilators.  2. Vocal cord dysfunction.  3. Bipolar disorder, recently started on a new atypical antipsychotic      called Saphris.  4. Obesity.  5. Borderline hyperlipidemia.  6. GERD.   PAST SURGICAL HISTORY:  1.  Laparoscopic cholecystectomy.  2. Hysteroscopy with endometrial polyp removal.  3. Bilateral tubal ligation.  4. C-section x2.  5. Right knee surgery.   HOME MEDICATIONS:  1. Saphris 5 mg p.o. b.i.d.  2. Trazodone 50 mg 1-3 tabs p.o. nightly.  3. Ranitidine 150 mg p.o. daily.  4. Diurex which is caffeine a 100 mg 1-2 pills p.o. b.i.d.  5. Probiotics per over-the-counter regimen.  6. Xanax unknown dose p.r.n.   ALLERGIES:  1. ASPIRIN causes stomach upset.  2. SEPTRA causes easy bruising.   SOCIAL HISTORY:  She lives in [place] and takes care of her  granddaughter part time.  She works as a Psychologist, clinical  for [employer], but is currently not working, while her  psychiatric medications are being adjusted.  She is divorced and has 2  children that are healthy.  She has approximately 20-pack-year smoking  history, but quit about 8 years ago, and denies any alcohol or illegal  drugs.  She recently joined [employer] and is trying to lose  weight.   FAMILY HISTORY:  Mother had CHF  and died in her mid 96s and her children  are healthy.   REVIEW OF SYSTEMS:  Notable for fatigue, anxiety, edema, palpitations,  nausea, vertigo, presyncope, and headache.  All other systems were  reviewed and negative.   ADMISSION PHYSICAL EXAMINATION:  VITAL SIGNS:  Temperature 97.5, blood  pressure 118/76, heart rate 75, respiratory rate 28 initially and  presently at 18, and O2 saturation 100% on room air.  GENERAL:  She is alert and oriented, in no distress.  HEENT:  Normocephalic and atraumatic.  Pupils equally round and reactive  to light.  Extraocular motions intact.  Sclerae clear.  Moist mucous  membranes.  Fair dentition.  Oropharynx without erythema or exudate.  NECK:  Supple without lymphadenopathy, thyromegaly, carotid bruits, or  JVD.  CARDIOVASCULAR:  Regular rate and rhythm with normal S1 and S2 without  murmurs, rubs, or gallops.  Dorsalis pedis, radial,  and carotid pulses  are 2+ without bruits.  LUNGS:  Clear to auscultation bilaterally with normal respiratory  effort.  ABDOMEN:  Good bowel sounds, soft, nontender, nondistended, with well  healed incision noted.  EXTREMITIES:  No clubbing or cyanosis.  No edema noted in her upper  extremities and trace pitting edema in her bilateral lower extremities.  MUSCULOSKELETAL:  Strength 5/5 in all extremities.  NEUROLOGIC:  Alert and oriented x3, cranial nerves II through XII were  intact, strength 5/5 in all extremities, and no cerebellar  abnormalities.   DIAGNOSTIC IMAGING:  Chest x-ray with mild peribronchial thickening and  no acute process.   EKG shows a normal sinus rhythm at rate of 71 with normal P-R interval  and right bundle-branch block.  No Q's were noted and this was compared  with an EKG from August 2002.  The right bundle branch block is new  compared to the EKG from 2002.   LABORATORY DATA:  Sodium 140, potassium 4.3, chloride 106, bicarb 26,  BUN 21, creatinine 1.1, glucose 96, myoglobin 41, MB less than 1.0 and  troponin less than 0.05.   ASSESSMENT AND PLAN:  The patient is a 55 year old female with:  1. Palpitations.  Feel like her symptoms of palpitations, vertigo,      presyncope, nausea, and global fatigue is likely related to her      recent caffeine ingestion along with an interaction between her      psychiatric medications to include trazodone and Saphris.  This is      also supported by the temporal relationship between taking the      trazodone and her symptoms starting.  She does have a new right      bundle-branch block compared to an EKG from 2002, but do not think      that she has any cardiac pathology that is contributing to her      present symptoms.  She is also complaining of significant      peripheral edema, but on exam this is not really appreciated at      this      point.  At this point, we check orthostatics and give IV fluids, if       necessary.  Also recommended discontinuing the caffeine pills.  We      would recommend the patient to follow up in her primary physician's      office in the next week and with [facility] if palpitations      persist.      Joaquin Courts, MD  Electronically Signed  Verne Carrow, MD  Electronically Signed    VW/MEDQ  D:  05/04/2008  T:  05/05/2008  Job:  [id number]

## 2010-10-31 NOTE — Letter (Signed)
June 01, 2009   Barbaraann Share, MD, FCCP  [address]  [place], [place] [address]   Re:  Brittany [name], Brittany [name]               DOB:  1956/03/05   Dear Mellody Dance,   The patient came for followup today.  Her chest x-ray looks great.  Her  incisions are healing well at the anterior chest tube.  They were  worried about still an eschar on it, but again it looks like it has  healed without a problem.  I told her to gradually increase her  activities and that I will see her back again in 6 weeks with another  chest x-ray.  She is having some moderate pain but this is improving.  I  appreciate the opportunity of taking care the patient.   Sincerely,   Ines Bloomer, M.D.  Electronically Signed   DPB/MEDQ  D:  06/01/2009  T:  06/02/2009  Job:  [id number]   cc:   Deidre Ala, MD

## 2010-10-31 NOTE — H&P (Signed)
Brittany [name], Brittany [name] NO.:  [id number]   MEDICAL RECORD NO.:  [id number]          PATIENT TYPE:  IPS   LOCATION:  0302                          FACILITY:  [facility]   PHYSICIAN:  Jasmine Pang, M.D. DATE OF BIRTH:  10-27-1955   DATE OF ADMISSION:  02/04/2009  DATE OF DISCHARGE:                       PSYCHIATRIC ADMISSION ASSESSMENT   This is a 55 year old divorced white female.  Dr. Evelene Croon requested that  the patient be evaluated for depression and having suicidal ideation.  The patient has been out of work since Oct 26, 2008 due to her symptoms.  She stated that she could not concentrate.  She was having panic and  anxiety, and her symptoms seemed to be worse.  Recently, she has had an  increase in suicidal ideation.  She feels useless, hopeless and  worthless.  Two weeks ago, they told her that although she is still  employed she lost her position, and her short-term disability was  denied.  Yesterday, she had a plan to drive off a bridge or overdose,  and she does have access to means; she does have a car and she does have  medication.   PAST PSYCHIATRIC HISTORY:  She began care about 3 years ago.  It has all  been outpatient prior to this admission.   SOCIAL HISTORY:  She finished high school in 1975.  She has had some  college.  She has been married and divorced three times.  She has a son  72, a daughter 46.  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   FAMILY HISTORY:  Her sister has some type of anxiety disorder.  The  paternal side of the family has bipolar disorder.  Her father suicided  when she was 3.   ALCOHOL AND DRUG HISTORY:  She denies.   PRIMARY CARE Bethany Hirt:  Her primary care Dwyne Hasegawa is Dr. Dorothyann Peng.  Her psychiatrist is Dr. Milagros Evener.   MEDICAL PROBLEMS:  1. Asthma, for which she has not had an attack in 2 years.  2. Vitamin D deficiency.   MEDICATIONS:  1. Symbyax 6/50 for the past 3  days.  2. Xanax 1 mg up to six a day.  3. Vitamin supplements.  4. Albuterol p.r.n.   DRUG ALLERGIES:  1. ASPIRIN.  2. SEPTRA   POSITIVE PHYSICAL FINDINGS:  Her UDS was positive for benzodiazepines  she is prescribed.  She had no other remarkable findings.  Her vital  signs showed her temperature was 98.3, her pulse was 93, her  respirations were 20, and I do not see  her blood pressure in front of  me.   MENTAL STATUS EXAM:  She was seen in conjunction with Dr. Lolly Mustache.  She  was casually dressed.  Her speech was soft and slow.  Her eye contact  was fair.  Her mood was depressed.  Her affect was constricted.  She  continued to have suicidal ideation but denied an actual plan.  She  denied auditory hallucinations or homicidal ideation but endorses  paranoid.  She often feels that  someone is behind her or watching her,  even though she knows that her pet dogs would bark if someone in fact  was there.  She is alert and oriented x3.  Judgment and insight are fair  and attention and concentration were okay.   DIAGNOSES:  AXIS I:  Major depressive disorder, severe, rule out  bipolar, depressed.  Dr. Lolly Mustache wanted to stop the Symbyax and start  Abilify.  Toward that end, she was started on Abilify 5 mg p.o. daily.  AXIS II:  Relationship issues.  Married and divorced x3.  AXIS III:  History for asthma and vitamin D deficiency.  AXIS IV:  Severe.  She is having issues with support, with occupation  and Nurse, children's.  AXIS V:  35.   PLAN:  The plan is to admit for safety and stabilization, to adjust her  medications as indicated.  Estimated length of stay is 3-5 days.      Mickie Leonarda Salon, P.A.-C.      Jasmine Pang, M.D.  Electronically Signed    MD/MEDQ  D:  02/05/2009  T:  02/05/2009  Job:  [id number]

## 2010-10-31 NOTE — Letter (Signed)
April 27, 2009   V. Charlesetta Shanks, MD  [address]  [place], [place] [address]   Re:  Brittany [name], Brittany [name]               DOB:  05-11-1956   Dear Illene Labrador:   I saw the patient back in the office today.  Her blood pressure was  124/80, pulse 100, respirations 18, and sats were 97%.  She is doing  well overall.  We removed her chest tube sutures and placed a small  drain.  Obviously, there is some mild amount of drainage from that.  I  told her she could gradually increase her activities and we would see  her back again in 1 week with another chest x-ray.  I appreciate the  opportunity of taking care of the patient.  Her wedge resection did show  emphysematous changes in her lung, which would go along with the release  when she had prolonged air leak.   Sincerely,   Ines Bloomer, M.D.  Electronically Signed   DPB/MEDQ  D:  04/27/2009  T:  04/27/2009  Job:  [id number]

## 2010-11-03 NOTE — Assessment & Plan Note (Signed)
[facility]                               PULMONARY OFFICE NOTE   Brittany [name], Brittany [name]                      MRN:          [id number]  DATE:04/08/2006                            DOB:          02/09/1956    HISTORY OF PRESENT ILLNESS:  The patient is a 55 year old white female,  patient of Dr. Teddy Spike with history of asthma with significant upper airway  instability felt secondary to probable vocal cord dysfunction.  The patient  has been having significant difficulties over the last several weeks with  recurrent episodes of shortness of breath, dry cough and wheezing.  The  patient was hospitalized September 26 through September 28 for an asthmatic  exacerbation with significant shortness of breath and wheezing. The patient  was treated with nebulized bronchodilators.  The patient was seen back in  the office by Dr. Shelle Iron and started on Symbicort 160/4.5, 2 puffs twice  daily along with an aggressive reflux preventive measures with __________  regimen including Protonix 40 mg twice daily and Reglan q.i.d.  A CAT scan  of the sinuses was unremarkable.  The patient returned without any  significant improvement in symptoms and complaining that Reglan and  clonazepam were causing increased sedation and depression symptoms.  The  patient reports she is quite depressed currently seeing a psychologist and  has recently been referred to a psychiatrist for significant anxiety and  depression.  The patient also is having difficulty because she has missed  quite a bit of work due to her reported asthma exacerbations.  She is having  difficulty filling her medications.  She does report she is taking Symbicort  and Protonix because she has samples.  However, she was recommended at last  visit to add Mucinex DM to her regimen and she reports she was unable to do  that because she is unable to afford her over-the-counter medicines.  The  patient denies any  suicidal ideations.  The patient denies any purulent  sputum, fever, chest pain, orthopnea, PND or leg swelling.  The patient has  had difficulties with recurrent flares of reactive symptoms however, but  despite being treated with inhaled corticosteroids, aggressive treatment  towards rhinitis and reflux prevention, the patient's symptoms do not seem  to reoccur.  There is also question if patient takes medications as  prescribed as well.  The patient's most recent pulmonary function test shows  FEV-1 of 1.72, which is 68% of the predicted.  The patient does have a 22%  change in the postbronchodilator.   PAST MEDICAL HISTORY:  1. Multiple endometrial polyps.  2. Status post bilateral tubal ligation.   CURRENT MEDICATIONS:  1. Nasacort AQ 2 puffs daily.  2. Protonix 40 mg b.i.d.  3. Symbicort 150/4.4, 2 b.i.d.   DRUG ALLERGIES:  NO KNOWN DRUG ALLERGIES.   FAMILY HISTORY:  Congestive heart failure.   SOCIAL HISTORY:  Patient has never smoked.  Denies any alcohol.  She is  divorced.  She has 2 grown children.  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   REVIEW OF SYSTEMS:  Essentially negative except as noted above.   PHYSICAL EXAMINATION:  GENERAL:  The patient is a very anxious and quite  tearful white female in no acute distress.  VITAL SIGNS:  She is afebrile with stable vital signs.  O2 saturation is 99%  on room air.  Weight is down 1 pound at 180.  HEENT:  Nasal mucosa is slightly pale.  Nontender sinuses.  __________ .  Posterior oropharynx is clear.  NECK:  Supple without adenopathy.  No JVD.  LUNGS:  Lung sounds are clear to auscultation bilaterally without any  wheezing and no crackles.  Patient does have upper airway pseudo-wheezing on  forced expiration.  CARDIAC:  S1 and S2. No murmur, rub or gallop.  Cardiac is a regular rate  and rhythm.  ABDOMEN:  Soft and benign without any hepatosplenomegaly.  No guarding or  rebound noted.   EXTREMITIES:  Warm without any calf tenderness, cyanosis, clubbing or edema.  NEURO:  Alert and oriented x3.  No focal deficits detected.   DATA:  CT of the sinuses on March 19, 2006 was no acute infection.  There  is a soft stable tissue density in the left frontal sinus questionable  mucosal retention cyst versus polyp.   IMPRESSION AND PLAN:  1. Asthma with probable significant vocal cord dysfunction.  The patient      will continue on her current regimen with Symbicort 160/4.5, 2 puffs      twice daily.  The patient is advised to use Aerochamber and to rinse      well after use.  She will continue on aggressive reflux preventive      measures along with Protonix 40 mg twice daily along with anti-reflux      preventive diet.  The patient has recently been intolerant to Reglan.      The patient will continue on cough suppression regimen with Tessalon      Perles and has been recommended to add in Mucinex DM twice daily.  The      patient will be referred to the [facility].  The      patient does have a work note to be out of work until she is returned      back to Dr. Shelle Iron on November 5.  Currently, the patient has provided      [place] with disability forms today.  They will be completed as requested.  2. Anxiety and depression.  The patient is to continue to follow up with      her psychologist and psychiatrist as recommended.      ______________________________  Rubye Oaks, NP    ______________________________  Barbaraann Share, MD,FCCP    TP/MedQ  DD:  04/08/2006  DT:  04/09/2006  Job #:  [id number]

## 2010-11-03 NOTE — Assessment & Plan Note (Signed)
[facility]                               PULMONARY OFFICE NOTE   Brittany, [name]                      MRN:          [id number]  DATE:04/02/2006                            DOB:          15-Jun-1956    HISTORY OF PRESENT ILLNESS:  The patient is a 55 year old white female  patient of Dr. Shelle Iron who has had a recent exacerbation of asthmatic  bronchitis with upper airway dysfunction and vocal cord dysfunction.  The  patient returns today complaining that she continues to have intermittent  wheezing and cough.  She is recommended on Symbicort 160/4.5 twice daily  along with Protonix twice a day.  A CT of the sinuses was negative for acute  infection.  The patient returns today complaining that she still has some  intermittent wheezing and a dry cough.  The patient also complains that she  feels that she is not tolerating Reglan and Clonazepam, feels very unstable  with anxiety like symptoms.   Past medical history is reviewed.  Current medications are reviewed.   PHYSICAL EXAMINATION:  GENERAL:  The patient is a pleasant female in no  acute distress.  VITAL SIGNS:  She is afebrile with stable vital signs.  HEENT:  Nasal mucosa shows some mild erythema, nontender to sinus pressure.  Posterior pharynx is clear.  NECK:  Supple.  LUNGS:  Sounds are clear bilaterally.  CARDIAC:  Regular rate and rhythm.  ABDOMEN:  Soft.  EXTREMITIES:  Warm without edema.   IMPRESSION AND PLAN:  Slow to resolve asthmatic bronchitic exacerbation.  The patient is given Xopenex and antibiotic treatment in the office.  She is  continued on her current regimen along with adding Mucinex DM.  The patient  may currently hold Reglan and Clonazepam to see if anxiety and dizziness  symptoms resolve.  She will return with Dr. Shelle Iron in two weeks as scheduled  or sooner if needed.      ______________________________  Rubye Oaks, NP    ______________________________  Barbaraann Share, MD,FCCP    TP/MedQ  DD:  04/02/2006  DT:  04/03/2006  Job #:  [id number]

## 2010-11-03 NOTE — Op Note (Signed)
NAME:  Brittany [name], Brittany [name]                         ACCOUNT NO.:  [id number]   MEDICAL RECORD NO.:  [id number]                   PATIENT TYPE:  AMB   LOCATION:  [facility]                                 FACILITY:  [facility]   PHYSICIAN:  Gretta Cool, M.D.              DATE OF BIRTH:  18-Jul-1955   DATE OF PROCEDURE:  05/17/2003  DATE OF DISCHARGE:                                 OPERATIVE REPORT   PREOPERATIVE DIAGNOSIS:  Endometrial polyps, multiple, with abnormal uterine  bleeding.   POSTOPERATIVE DIAGNOSIS:  Endometrial polyps, multiple, with abnormal  uterine bleeding.   PROCEDURE:  Hysteroscopy, resection of multiple endometrial polyps, total  endometrial resection for ablation plus VaporTrode.   SURGEON:  Gretta Cool, M.D.   ANESTHESIA:  MAC plus paracervical block.   DESCRIPTION OF PROCEDURE:  Under excellent anesthesia as above with the  patient prepped and draped in Allen stirrups in modified lithotomy, the  speculum was placed in the vagina and the cervix grasped with a single-tooth  tenaculum.  The cervix was then progressively dilated with a series of Pratt  dilators to accommodate a 7-mm resectoscope.  The uterus was photographed  and multiple polyps documented.  The largest one was adherent to the fundal  wall anteriorly.  The polyps were progressively resected with a double  resectoscope loop.  The entire endometrial cavity was then progressively  resected so as to remove all of the endometrial tissue down 3 to 5 mm into  the myometrium.  At this point, the VaporTrode was applied and the entire  cavity treated with VaporTrode so as to eliminate any viable islands of  endometrial tissue.  At this point, the pressure was reduced and bleeding  points treated via VaporTrode cautery.  At this point, the procedure was  terminated without complications.  Deficit approximately 90 mL.   COMPLICATIONS:  None.                                               Gretta Cool,  M.D.    CWL/MEDQ  D:  05/17/2003  T:  05/17/2003  Job:  [id number]   cc:   Donia Guiles, M.D.  [address] [place]  [place] [address]  Fax: [phone]

## 2010-11-03 NOTE — Assessment & Plan Note (Signed)
[facility]                            CARDIOLOGY OFFICE NOTE   Brittany, [name]                      MRN:          [id number]  DATE:05/16/2006                            DOB:          Jan 31, 1956    Brittany [name] returns today for followup.  When I initially saw her, she was  quite tight and had significant asthma exacerbation.  This is resolved  with her steroid taper.  She sees Dr. Shelle Iron for this.  She continues to  have intermittent chest pain.  I still think it is secondary to reflux  and her asthma.  She had a stress Myoview study done a couple of days  ago, which was entirely normal with an EF of 85%.  Her EKG was also  normal with exercise.   In talking to the patient, I told her that I thought her symptoms were  related to reflux and asthma; however, I did give her a prescription for  sublingual nitroglycerin.  If she gets squeezing chest pain with  exercise that is not clearly related to a recent meal or wheezing, then  she will take it.  I will see her back in six months.  I will see her  sooner if she has reproducible relief with nitro.  I explained to her  that I have a low threshold to cath people with lung problems, since it  is not ideal to have both cardiac and pulmonary problems; however, I  think currently she is stable, and there is a low likelihood of coronary  disease.  She did mention that she wanted her daughter, Brittany [name], seen  as a new consult.  Brittany [name] apparently has inappropriate or what appears to  be excessive tachycardia with exercise.  I told her I would be happy to  do this.   REVIEW OF SYSTEMS:  In talking to Brittany [name], apparently she had a heart  murmur as a child.  She describes having a cut-down cath on her left  arm.  She had an echo in the hospital, and there was no evidence of a  murmur.  I do not know if she had a perimembranous VSD that closed, but  we have not heard any pathological murmurs, and none were  detected by  echo.   PHYSICAL EXAMINATION:  VITAL SIGNS:  Blood pressure 120/70, pulse 80 and  regular.  HEENT:  Normal.  NECK:  Carotids are normal.  There is no thyromegaly.  No  lymphadenopathy.  LUNGS:  Clear today and much improved from her previous exam.  HEART:  There is an S1 and S2 with normal heart sounds.  ABDOMEN:  Benign.  EXTREMITIES:  Lower extremities have intact pulses.  No edema.   Her stress Myoview was normal with an EF of 85%.   IMPRESSION:  Stable chest pressure, likely related to reflux or asthma.  Sublingual nitroglycerin given to see if this helps.  She will continue  her albuterol, Nasacort, and Symbicort.  She will follow up with Dr.  Shelle Iron for her asthma.  I will see her back in six months.  She  will  continue her Protonix for reflux.   I will be seeing her daughter in the future as a new consult.     Brittany [name]. Eden Emms, MD, [facility]  Electronically Signed    PCN/MedQ  DD: 05/16/2006  DT: 05/16/2006  Job #: [id number]

## 2010-11-03 NOTE — H&P (Signed)
[name], [name]               ACCOUNT NO.:  [id number]   MEDICAL RECORD NO.:  [id number]          PATIENT TYPE:  EMS   LOCATION:  [facility]                         FACILITY:  [facility]   PHYSICIAN:  Lonia Blood, M.D.DATE OF BIRTH:  05/05/1956   DATE OF ADMISSION:  03/13/2006  DATE OF DISCHARGE:                                HISTORY & PHYSICAL   PRIMARY CARE PHYSICIAN:  Unassigned.   CHIEF COMPLAINT:  Shortness of breath.   HISTORY OF PRESENT ILLNESS:  Brittany [name] is a 55 year old female with  medical history detailed below.  She has a longstanding history of asthma.  She has been followed in the past by Dr. Marcelyn Bruins at [facility].  She reports that she no longer follows him because he explained to her that  her asthma was due to reflux disease and that she disagreed with this.  She has not seen a pulmonologist since that time.  She did not seek regular  medical follow-up.  She reports that she has recently seen Dr. Herb Grays  on one occasion but has not yet established ongoing care with her.  Nevertheless, the patient reports a two week history of intermittent  shortness of breath. She reports at any time the weather changes she  develops trouble such as this.  She describes her trouble breathing as  intermittent spells of trouble catching my breath and wheezing. Per her  opinion this is dependent upon humidity and changes in temperature.  She  does require albuterol she reports prior to exercising.  Over the last week,  however, her symptoms have gotten worse.  This is different than her usual  minimal flares.  This most recent period of time has been marked by  gradually progressive worsening shortness of breath.  She, for the last 48  hours, has wheezed severely enough that she has had difficulty catching her  breath. She reports that she has not been able to complete full sentences.  She had no pain in the chest of note.  She has had no fevers or chills.  There has been no productive cough. There has been no abdominal pain.  There  has been no nausea or vomiting.  There has been no chest discomfort such as  angina.  The patient does report that her symptoms seem to have gotten much  worse today when her mother was unexpectedly admitted to the hospital. She  admits that she feels that her nerve are on edge.  This seems to make her  breathing worse per her report.  She, however, reports that she thinks the  trouble with her nerves is a result of her breathing and not the other way  around.  She has attempted to treat her symptoms with albuterol but has  failed.   REVIEW OF SYSTEMS:  Comprehensive review of systems is accomplished but is  unremarkable with the exception of multiple positive positive aliments noted  in the history of present illness above.   PAST MEDICAL HISTORY:  1. Asthma      a.     Diagnosis as vocal cord  dysfunction versus reflux disease by       [facility].      b.     The patient has not followed up as she disagreed with their       assessment.  2. Status post resection of multiple endometrial polyps via hysteroscopy      04/2003.  3. Status post lap coli 01/2001.  4. History of c-section.  5. Status post bilateral tubal ligations.  6. Cardiac murmur, not previously described.   MEDICATIONS:  1. Albuterol p.r.n.  2. Atrovent p.r.n.  3. [facility]  4. Aspirin.  5. Vioxx.  6. Septra.   FAMILY HISTORY:  The patient's mother is alive but suffers with CHF and  apparently was diagnosed with aneurysm today.  The patient's father has  deceased but the patient does not know his history as she died when she was  3.   SOCIAL HISTORY:  The patient does not now nor has she ever smoked.  She does  not drink alcohol.  She is divorced. She has two children.  She is a  Occupational psychologist.   On date of review, chest x-ray reveals minimal streaky bibasilar  atelectasis  without infiltrate or edema and no other lab tests are available in the  emergency room.   PHYSICAL EXAMINATION:  VITALS:  The patient 97.4, blood pressure 124/78,  heart rate 89, respiratory rate 22, O2 sat is 98% on room air.  GENERAL:  Well-developed, well-nourished female who does not appear to be in  acute respiratory distress at the present time.  She is somewhat using  accessory muscles and respiration and does appear anxious.  HEENT:  Normocephalic, atraumatic, pupils equal, round, react to light and  accommodation.  Extraocular muscles intact.  Oropharynx is clear.  NECK: No JVD, no lymphadenopathy, no thyromegaly.  LUNGS:  At present breath sounds are clear throughout all fields.  There is  no significant wheeze.  There is some mild expiratory wheezing.  It is  difficult to tell if this is actually upper airway wheeze or true lower  airway wheezing.  There are no focal crackles.  The patient definitely  appears anxious.  She is somewhat tachypnea.  CARDIOVASCULAR:  Tachycardic at approximately 100 beats per minute at  present without gallop or rub with a 2/6 holosystolic murmur appreciable in  all positions but not radiating to the neck or the clavicular region.  ABDOMEN:  Obese, soft, bowel sounds present.  No hepatosplenomegaly. No  rebound, no ascites.  EXTREMITIES:  Trace bilateral lower extremity without cyanosis or clubbing.  NEUROLOGIC:  5/5 strength upper and lower extremities.  Intact sensation  touch throughout.  Cranial nerves II-XII intact bilaterally.   IMPRESSION AND PLAN:  1. Mixed dyspnea - this likely represents a very complex complication of      multiple factors.  There may infarct be an element of the      gastroesophageal reflux disease.  There may also be an element of vocal      cord dysfunction.  Anxiety certainly appears to be playing some role as     well. The patient certainly could have an element of broncho spastic      airway  disease.  I will admit the patient for 23 hour observation.  She      does seem to be improved now but she remains very anxious and      tachypneic and I am concerned that her  airway obstruction may resume      soon via bronchospasm or vocal cord dysfunction.  We will administer      albuterol and Atrovent but I will hold on systemic steroids at the      present time as the patient appears to be improving.  I will also place      the patient on Klonopin for its angiolithic effects.  If this improves      her symptoms significantly, we may consider continuing this at      discharge.  2. Cardiac murmur.  The patient reports that she was told in the distant      past that she has a heart murmur. She has not, however, ever had this      evaluated to a further extent.   PHYSICAL EXAMINATION:  Does not exhibit symptoms concerning for aortic  stenosis.  The patient does not have clinical signs or symptoms to suggest  aortic stenosis.  I suspect this may represent a murmur or mitral valve  regurg.  If this is the case, this certainly would fit in with the patient's  anxiety and episodes of trouble breathing.  I will obtain an echocardiogram  to further evaluate this.      Lonia Blood, M.D.  Electronically Signed     JTM/MEDQ  D:  03/13/2006  T:  03/14/2006  Job:  [id number]

## 2010-11-03 NOTE — Assessment & Plan Note (Signed)
[facility]                               PULMONARY OFFICE NOTE   Brittany [name], Brittany [name]                      MRN:          [id number]  DATE:03/27/2006                            DOB:          [birth year]    Date of followup is March 27, 2006.   SUBJECTIVE:  Brittany [name] comes in today where she has continued to have a dry  cough with minimal clear mucus as well as some increasing shortness of  breath. She is also describing a pressure in her chest that is slow and  stabbing with radiation down her left arm. The pain does not have features  typical of angina, nor is it necessarily worse with exertion. The patient  still has not gotten on Protonix b.i.d. as I asked her to and she is not  even taking the Prilosec over-the-counter on a regular basis. It is really  unclear to me if she is even taking her Symbicort. She denies medical  noncompliance with her other medications, however, this has been her history  in the past.   PHYSICAL EXAMINATION:  Blood pressure: 104/70. Pulse: 85. Temperature: 97.8.  Weight: 184 pounds. O2 saturation on room air is 97%.  CHEST: Is totally clear with some mild upper airway pseudo-wheezing.  CARDIAC: Reveals regular rate and rhythm.  EXTREMITIES: Lower extremities are without edema.   IMPRESSION:  1. Significant upper airway dysfunction with VCD. I continue to believe      that LPR is playing a major issue with this. Her CT of the sinuses was      totally clear except for a questionable polyp, but no evidence for      infection. 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 The      patient has been very difficult because of her medical noncompliance      and her inability and unwillingness to stick with any kind of a plan      for more than one week. It has made her care  extremely difficult.  2. Chest pain which is atypical in nature and very unlikely to be angina.      However, I told the patient that if this pain returns with radiation      down her left arm she is to go to the emergency room immediately.   PLAN:  1. I have asked the patient to go ahead and get the Protonix at 40 mg      b.i.d. filled and to take it on a regular basis along with the Reglan.  2. Stay on Symbicort and keep her mouth rinsed at all times. I suspect if      we can treat her reflux more aggressively and also work on her anxiety      that she will not need inhaled corticosteroids or bronchodilators.  3. The patient is to go to the emergency room immediately if her chest  discomfort returns.  4. She is to follow up in four weeks or sooner if there are problems.            ______________________________  Barbaraann Share, MD,FCCP      [facility]/MedQ  DD:  04/09/2006  DT:  04/09/2006  Job #:  [id number]   cc:   Tammy R. Collins [place], M.D.

## 2010-11-03 NOTE — Discharge Summary (Signed)
Brittany [name], Brittany [name]               ACCOUNT NO.:  [id number]   MEDICAL RECORD NO.:  [id number]          PATIENT TYPE:  OBV   LOCATION:  6533                         FACILITY:  [facility]   PHYSICIAN:  Madaline Savage, MD        DATE OF BIRTH:  1955-10-16   DATE OF ADMISSION:  03/13/2006  DATE OF DISCHARGE:  03/15/2006                                 DISCHARGE SUMMARY   DISCHARGE DIAGNOSES:  1. Bronchospasm secondary to possible asthma versus vocal cord dysfunction      versus anxiety.  2. Hypokalemia.  3. Anxiety.   CONSULTS IN THE HOSPITAL:  None.   PROCEDURES DONE IN THE HOSPITAL:  She had an echocardiogram done on the 27th  of September, 2007, which showed normal left ventricular systolic function  with an ejection fraction of 55-65%, no other abnormality was detected.   DISCHARGE MEDICATIONS:  1. Albuterol plus Atrovent by nebulizer as she was using at home.  2. Robitussin one spoon 3 times daily as needed.  3. Klonopin 0.5 mg three times daily.  4. Protonix 40 mg once daily.  5. Prescription diet pill as she was taking before.  6. Vioxx as needed for pain.  7. Aspirin 81 mg daily.   SHORT HOSPITAL COURSE:  Brittany [name] is a 55 year old Caucasian lady with a  questionable history of asthma who came in to the ER with complaints of  shortness of breath.  She has seen Dr. Marcelyn Bruins at [facility], in  the past.  She reports that she no longer follow up with him because he  explained to her that her asthma was due to reflux disease.  She disagreed  with this.  She has not seen a pulmonologist since then.  She comes in with  complaints of cough, which has been going on for a couple of weeks, and  intermittent shortness of breath.  She has no productive cough, no nausea,  no vomiting.  When she came to the ER she was given some aerosol treatments,  her breathing has improved since then.  She also told us that some doctor  has told her in the past that she has a cardiac murmur  which we did not hear  but we ordered an echocardiogram to evaluate it, but the echocardiogram did  not show any abnormalities.  During the course of hospital stay she was  given aerosol treatments and she was also started on Klonopin because it can  reduce anxiety.  She does look very anxious.  She has done well on it so we  are going to discharge her home on the Klonopin.  Now she is being  discharged home in a stable condition.   DISCHARGE DISPOSITION:  She will follow up with her primary care doctor, Dr.  Dewain Penning, in about 2 weeks.  The new medications which we started in the  hospital are Klonopin 0.5 mg three times daily and Protonix 40 mg daily for  her questionable asthma.  She also requested she wants to stay off work for  a week.  We will give  her a prescription for staying off work for a week.      Madaline Savage, MD  Electronically Signed     PKN/MEDQ  D:  03/15/2006  T:  03/15/2006  Job:  [id number]   cc:   Tammy R. Collins [place], M.D.

## 2010-11-03 NOTE — Op Note (Signed)
[facility]  Patient:    Brittany [name], Brittany [name]                                Visit Number: [id number] MRN: [id number]          Service Type: DSU Location: [facility]M 2550 03 Attending:  Tempie Donning Adm. Date:  [id number]   CC:         Desma Maxim, M.D.   Operative Report  PREOPERATIVE DIAGNOSIS:  Cholelithiasis.  POSTOPERATIVE DIAGNOSIS:  Cholelithiasis.  PROCEDURE:  Laparoscopic cholecystectomy.  SURGEON:  Gita Kudo, M.D.  ASSISTANT:  Donnie Coffin. Samuella Cota, M.D.  ANESTHESIA:  General endotracheal.  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  No real fatty food intolerance, and it is in the right upper quadrant.  She has had preop cardiac evaluation at [facility] and was told these studies were all normal.  Liver function studies are normal, and ultrasound shows multiple stones with no evidence of dilatation.  OPERATIVE FINDINGS:  The patient has had a previous C-section and BTL.  The gallbladder was normal thickness and had multiple filmy adhesions to it with no evidence of acute infection.  The cystic duct and artery were normal in size and location.  DESCRIPTION OF PROCEDURE:  Under satisfactory general endotracheal anesthesia, having received 1 g Ancef preop, the patients abdomen was prepped and draped in a standard fashion.  A transverse incision made below the umbilicus in her previous incision and carried down to the peritoneum through the midline. This was controlled with a figure-of-eight 0 Vicryl suture and an operating Hasson port inserted, secured, and good CO2 pneumoperitoneum established. Camera was placed and under direct vision, two #5 ports were placed laterally and a second #10 port medially through skin sites that were infiltrated with Marcaine.  Then graspers through the lateral port gave excellent exposure, and I operated at the gallbladder-cystic duct junction.  I carefully  dissected this until I could delineate the cystic duct and artery, and these were circumferentially dissected with a right angle clamp.  When certain of the anatomy, each of these structures was controlled with multiple metal clips and divided between the distal two.  Then the gallbladder was removed from below upward, using the coagulating spatula for hemostasis and dissection.  After removing the gallbladder, the liver bed was lavaged with saline and suctioned dry after making it hemostatic by cautery.  The camera was then moved to the upper port and through the umbilical port a large grasper used to extract the gallbladder intact and without spillage or complication.  Then the operative site was reinspected, lavaged with saline, checked with cautery, and then suctioned dry.  The ports were removed under direct vision and CO2 released.  Then the midline was closed with the previous figure-of-eight suture as well as a second interrupted 0 Vicryl figure-of-eight suture.  All subcutaneous tissue approximated with 4-0 Vicryl and skin stitches of 4-0 Vicryl used in the umbilicus.  Then Steri-Strips applied and sterile absorbent dressings.  There were no complications, and the sponge and needle counts were correct. DD:  01/31/01 TD:  01/31/01 Job: [id number]

## 2011-03-20 LAB — POCT CARDIAC MARKERS: Troponin i, poc: <0.05

## 2011-03-20 LAB — POCT I-STAT, CHEM 8
BUN: 21
Calcium, Ion: 1.16
Chloride: 106
Creatinine, Ser: 1.1
Glucose, Bld: 96

## 2011-04-16 ENCOUNTER — Telehealth: Payer: Self-pay | Admitting: Pulmonary Disease

## 2011-04-16 NOTE — Telephone Encounter (Signed)
I advised the pt that according to records her last PNA vaccine was 06-18-2005 so she is not due for another one at this time. Carron Curie, CMA

## 2011-04-26 ENCOUNTER — Ambulatory Visit (INDEPENDENT_AMBULATORY_CARE_PROVIDER_SITE_OTHER): Payer: Medicare Other

## 2011-04-26 ENCOUNTER — Telehealth: Payer: Self-pay | Admitting: Pulmonary Disease

## 2011-04-26 DIAGNOSIS — Z23 Encounter for immunization: Secondary | ICD-10-CM

## 2011-04-26 NOTE — Telephone Encounter (Signed)
Spoke with pt in exam room. Informed her per our last note on 04/27/11 she was on Symbicort (not Qvar) and was told to f/u in 6 weeks which pt never did.  Pt states she has been unable to afford to come in for an appt until next month when she gets paid.  Pt scheduled to see [facility] on 05/28/11 and (at that time we can set her up with the patient assistance program for Symbicort).  Offered pt a sooner appt and stated we could bill her for her visit.  Pt declined stating she could not "handle the stress of one more bill."  Therefore, gave pt one sample of Symbicort ( no more avail on the floor)  Pt concerned that this one sample would not last until pending appt with [facility].  Informed pt to call back in a few weeks to see if more were avail.  Pt verbalized understanding.

## 2011-05-28 ENCOUNTER — Ambulatory Visit: Payer: Self-pay | Admitting: Pulmonary Disease

## 2011-09-20 ENCOUNTER — Telehealth: Payer: Self-pay | Admitting: Pulmonary Disease

## 2011-09-20 NOTE — Telephone Encounter (Signed)
I spoke with pt and she stated she wanted a sample of symbicort. i advised we did not have any samples and she has not been seen since 05/2010 and she needed an OV. I offered to make OV but she stated she will call back. Nothing further was needed

## 2014-03-10 ENCOUNTER — Ambulatory Visit: Payer: Self-pay | Admitting: Obstetrics & Gynecology

## 2014-04-12 ENCOUNTER — Ambulatory Visit: Payer: Commercial Managed Care - HMO | Admitting: Obstetrics & Gynecology

## 2014-04-14 ENCOUNTER — Other Ambulatory Visit: Payer: Self-pay | Admitting: Dermatology

## 2014-04-19 ENCOUNTER — Encounter: Payer: Self-pay | Admitting: Obstetrics & Gynecology

## 2014-04-19 ENCOUNTER — Ambulatory Visit (INDEPENDENT_AMBULATORY_CARE_PROVIDER_SITE_OTHER): Payer: Commercial Managed Care - HMO | Admitting: Obstetrics & Gynecology

## 2014-04-19 ENCOUNTER — Other Ambulatory Visit: Payer: Self-pay | Admitting: Obstetrics & Gynecology

## 2014-04-19 VITALS — BP 134/92 | HR 102 | Temp 97.1°F | Ht 62.0 in | Wt 208.0 lb

## 2014-04-19 DIAGNOSIS — Z1231 Encounter for screening mammogram for malignant neoplasm of breast: Secondary | ICD-10-CM

## 2014-04-19 DIAGNOSIS — N951 Menopausal and female climacteric states: Secondary | ICD-10-CM

## 2014-04-19 DIAGNOSIS — Z01419 Encounter for gynecological examination (general) (routine) without abnormal findings: Secondary | ICD-10-CM

## 2014-04-19 DIAGNOSIS — Z23 Encounter for immunization: Secondary | ICD-10-CM

## 2014-04-19 DIAGNOSIS — L292 Pruritus vulvae: Secondary | ICD-10-CM

## 2014-04-19 NOTE — Addendum Note (Signed)
Addended by: Valli Glance F on: 04/19/2014 12:07 PM   Modules accepted: Orders

## 2014-04-19 NOTE — Progress Notes (Signed)
Subjective:     Brittany [name] is a 58 y.o. female here for a routine exam.      Personal health questionnaire:  Is patient Ashkenazi Jewish, have a family history of breast and/or ovarian cancer: no Is there a family history of uterine cancer diagnosed at age < 48, gastrointestinal cancer, urinary tract cancer, family member who is a Field seismologist syndrome-associated carrier: no Is the patient overweight and hypertensive, family history of diabetes, personal history of gestational diabetes or PCOS: yes Is patient over 59, have PCOS,  family history of premature CHD under age 29, diabetes, smoke, have hypertension or peripheral artery disease:  yes At any time, has a partner hit, kicked or otherwise hurt or frightened you?: no Over the past 2 weeks, have you felt down, depressed or hopeless?: no Over the past 2 weeks, have you felt little interest or pleasure in doing things?:not asked   Gynecologic History No LMP recorded. Patient has had an ablation. Contraception: post menopausal status Last Pap: 2 yrs ago. Results were: normal Last mammogram: 1 yr ago. Results were: normal  Obstetric History OB History  Gravida Para Term Preterm AB SAB TAB Ectopic Multiple Living  3 3 3       2     # Outcome Date GA Lbr Len/2nd Weight Sex Delivery Anes PTL Lv  3 Term 06/29/82 [redacted]w[redacted]d  3.997 kg (8 lb 13 oz) F CS-Classical EPI  Y  2 Term 11/02/74 [redacted]w[redacted]d  3.969 kg (8 lb 12 oz) M CS-Classical Spinal,Gen N Y     Complications: Breech delivery  1 Term 06/12/73   2.722 kg (6 lb) F Vag-Spont None N ND     Complications: Cord around neck with compression      Past Medical History  Diagnosis Date  . Bipolar depression   . Anxiety   . Panic attacks   . PTSD (post-traumatic stress disorder)   . Phobia     Past Surgical History  Procedure Laterality Date  . Cesarean section    . Ablation    . Tubal ligation    . Knee surgery    . Cholecystectomy    . Carpal tunnel release    . Ulnar nerve repair       Current outpatient prescriptions: busPIRone (BUSPAR) 5 MG tablet, Take 5 mg by mouth 3 (three) times daily., Disp: , Rfl: ;  QUEtiapine (SEROQUEL) 400 MG tablet, Take 400 mg by mouth at bedtime., Disp: , Rfl: ;  temazepam (RESTORIL) 30 MG capsule, Take 30 mg by mouth at bedtime as needed for sleep., Disp: , Rfl:  Allergies  Allergen Reactions  . Aspirin   . Rofecoxib   . Sulfamethoxazole-Trimethoprim     History  Substance Use Topics  . Smoking status: Former Smoker    Quit date: 06/19/1999  . Smokeless tobacco: Not on file  . Alcohol Use: 0.0 oz/week    0 Not specified per week     Comment: Occass.     Family History  Problem Relation Age of Onset  . Heart failure Mother   . Hypertension Mother   . Hyperlipidemia Mother       Review of Systems  Constitutional: negative for fatigue and weight loss Respiratory: negative for cough and wheezing Cardiovascular: negative for chest pain, fatigue and palpitations Gastrointestinal: negative for abdominal pain and change in bowel habits Musculoskeletal:negative for myalgias Neurological: negative for gait problems and tremors Behavioral/Psych: negative for abusive relationship, depression Endocrine: negative for temperature intolerance  Genitourinary:positive for hot flushes, vaginal itching Integument/breast: negative for breast lump, breast tenderness, nipple discharge and skin lesion(s)    Objective:       BP 134/92 mmHg  Pulse 102  Temp(Src) 97.1 F (36.2 C)  Ht 5\' 2"  (1.575 m)  Wt 94.348 kg (208 lb)  BMI 38.03 kg/m2 General:   alert  Skin:   no rash or abnormalities  Lungs:   clear to auscultation bilaterally  Heart:   regular rate and rhythm, S1, S2 normal, no murmur, click, rub or gallop  Breasts:   normal without suspicious masses, skin or nipple changes or axillary nodes  Abdomen:  normal findings: no organomegaly, soft, non-tender and no hernia  Pelvis:  External genitalia: mild erythema, atrophic  changes Urinary system: urethral meatus normal and bladder without fullness, nontender Vaginal: normal without tenderness, induration or masses Cervix: normal appearance Adnexa: normal bimanual exam Uterus: anteverted and non-tender, normal size   Lab Review  Labs reviewed no Radiologic studies reviewed no    Assessment:  Vulva--pruritus Postmenopausal--hot flushes   Plan:   Specimen-->medical diagnostics Return for biopsy of the vulva Calcium supplement, weight-bearing exercise, life-style modifications for menopausal symptoms Meds ordered this encounter  Medications  . QUEtiapine (SEROQUEL) 400 MG tablet    Sig: Take 400 mg by mouth at bedtime.  . busPIRone (BUSPAR) 5 MG tablet    Sig: Take 5 mg by mouth 3 (three) times daily.  . temazepam (RESTORIL) 30 MG capsule    Sig: Take 30 mg by mouth at bedtime as needed for sleep.   Orders Placed This Encounter  Procedures  . MM Digital Screening    Standing Status: Future     Number of Occurrences:      Standing Expiration Date: 06/20/2015    Scheduling Instructions:     [facility]    Order Specific Question:  Reason for Exam (SYMPTOM  OR DIAGNOSIS REQUIRED)    Answer:  Routine    Order Specific Question:  Is the patient pregnant?    Answer:  No    Order Specific Question:  Preferred imaging location?    Answer:  External  . Flu Vaccine QUAD 36+ mos IM (Fluarix)

## 2014-04-19 NOTE — Patient Instructions (Signed)
Vulva Biopsy A vulva biopsy is a procedure where a small piece of tissue is taken from the vulva, or outside of the female genital area. The vulva includes the folds of skin (labia), the clitoris, and the openings of the urethra and vagina. This sample is taken to obtain more information or a diagnosis regarding a lesion, growth, rash, blister, or some abnormal discoloration. It can also be done to remove an unwanted mole or wart. LET YOUR CAREGIVER KNOW ABOUT:  Any allergies to food or medicine.  Medicines taken, including vitamins, herbs, eyedrops, and over-the-counter medicines and creams.  Use of steroids (by mouth or creams).  Previous problems with anesthetics or numbing medicines.  History of bleeding problems or blood clots.  Previous surgery.  Other health problems, including diabetes and kidney problems.  Possibility of pregnancy, if this applies. RISKS AND COMPLICATIONS Generally, vulva biopsy is a safe procedure. However, as with any surgical procedure, complications can occur. Possible complications include:  Bleeding from the biopsy site.  Infection.  Injury to organs or structures near the biopsy site.  Long-lasting (chronic) pain at the biopsy site. BEFORE THE PROCEDURE  Wear loose and comfortable pants and underwear to the hospital or clinic. This will lessen rubbing on the biopsy site once the procedure is finished.  Bring sanitary pads and panty liners to use after the procedure. PROCEDURE  The biopsy site will be cleaned with an antiseptic. You will be given an injection of an anesthetic medicine in the biopsy site using a needle. A small tissue sample will be removed (excised). This sample may be sent for further examination depending on why you are having a biopsy. A medicine may be applied to the biopsy site to help stop the bleeding. Finally, the biopsy site may be closed with dissolvable stitches (sutures).  AFTER THE PROCEDURE  You may be given pain  medicine to help with any discomfort.  You may notice a small amount of bleeding from the biopsy area. This is normal. Wear a sanitary pad.  Do not rub the biopsy area after urinating. Gently pat the area dry or use a bottle filled with warm water (peri-bottle) to clean the area.  Your sutures should dissolve within one week or may need to be removed. Document Released: 05/21/2012 Document Reviewed: 05/21/2012 [facility] Patient Information 2015 [place]. This information is not intended to replace advice given to you by your health care provider. Make sure you discuss any questions you have with your health care provider. Menopause Menopause is the normal time of life when menstrual periods stop completely. Menopause is complete when you have missed 12 consecutive menstrual periods. It usually occurs between the ages of 64 years and 66 years. Very rarely does a woman develop menopause before the age of 53 years. At menopause, your ovaries stop producing the female hormones estrogen and progesterone. This can cause undesirable symptoms and also affect your health. Sometimes the symptoms may occur 4-5 years before the menopause begins. There is no relationship between menopause and:  Oral contraceptives.  Number of children you had.  Race.  The age your menstrual periods started (menarche). Heavy smokers and very thin women may develop menopause earlier in life. CAUSES  The ovaries stop producing the female hormones estrogen and progesterone.  Other causes include:  Surgery to remove both ovaries.  The ovaries stop functioning for no known reason.  Tumors of the pituitary gland in the brain.  Medical disease that affects the ovaries and hormone production.  Radiation treatment to the abdomen or pelvis.  Chemotherapy that affects the ovaries. SYMPTOMS   Hot flashes.  Night sweats.  Decrease in sex drive.  Vaginal dryness and thinning of the vagina causing painful  intercourse.  Dryness of the skin and developing wrinkles.  Headaches.  Tiredness.  Irritability.  Memory problems.  Weight gain.  Bladder infections.  Hair growth of the face and chest.  Infertility. More serious symptoms include:  Loss of bone (osteoporosis) causing breaks (fractures).  Depression.  Hardening and narrowing of the arteries (atherosclerosis) causing heart attacks and strokes. DIAGNOSIS   When the menstrual periods have stopped for 12 straight months.  Physical exam.  Hormone studies of the blood. TREATMENT  There are many treatment choices and nearly as many questions about them. The decisions to treat or not to treat menopausal changes is an individual choice made with your health care provider. Your health care provider can discuss the treatments with you. Together, you can decide which treatment will work best for you. Your treatment choices may include:   Hormone therapy (estrogen and progesterone).  Non-hormonal medicines.  Treating the individual symptoms with medicine (for example antidepressants for depression).  Herbal medicines that may help specific symptoms.  Counseling by a psychiatrist or psychologist.  Group therapy.  Lifestyle changes including:  Eating healthy.  Regular exercise.  Limiting caffeine and alcohol.  Stress management and meditation.  No treatment. HOME CARE INSTRUCTIONS   Take the medicine your health care provider gives you as directed.  Get plenty of sleep and rest.  Exercise regularly.  Eat a diet that contains calcium (good for the bones) and soy products (acts like estrogen hormone).  Avoid alcoholic beverages.  Do not smoke.  If you have hot flashes, dress in layers.  Take supplements, calcium, and vitamin D to strengthen bones.  You can use over-the-counter lubricants or moisturizers for vaginal dryness.  Group therapy is sometimes very helpful.  Acupuncture may be helpful in some  cases. SEEK MEDICAL CARE IF:   You are not sure you are in menopause.  You are having menopausal symptoms and need advice and treatment.  You are still having menstrual periods after age 82 years.  You have pain with intercourse.  Menopause is complete (no menstrual period for 12 months) and you develop vaginal bleeding.  You need a referral to a specialist (gynecologist, psychiatrist, or psychologist) for treatment. SEEK IMMEDIATE MEDICAL CARE IF:   You have severe depression.  You have excessive vaginal bleeding.  You fell and think you have a broken bone.  You have pain when you urinate.  You develop leg or chest pain.  You have a fast pounding heart beat (palpitations).  You have severe headaches.  You develop vision problems.  You feel a lump in your breast.  You have abdominal pain or severe indigestion. Document Released: 08/25/2003 Document Revised: 02/04/2013 Document Reviewed: 01/01/2013 [facility] Patient Information 2015 [place], [place]. This information is not intended to replace advice given to you by your health care provider. Make sure you discuss any questions you have with your health care provider. Hormone Therapy At menopause, your body begins making less estrogen and progesterone hormones. This causes the body to stop having menstrual periods. This is because estrogen and progesterone hormones control your periods and menstrual cycle. A lack of estrogen may cause symptoms such as:  Hot flushes (or hot flashes).  Vaginal dryness.  Dry skin.  Loss of sex drive.  Risk of bone loss (osteoporosis). When  this happens, you may choose to take hormone therapy to get back the estrogen lost during menopause. When the hormone estrogen is given alone, it is usually referred to as ET (Estrogen Therapy). When the hormone progestin is combined with estrogen, it is generally called HT (Hormone Therapy). This was formerly known as hormone replacement therapy (HRT).  Your caregiver can help you make a decision on what will be best for you. The decision to use HT seems to change often as new studies are done. Many studies do not agree on the benefits of hormone replacement therapy. LIKELY BENEFITS OF HT INCLUDE PROTECTION FROM:  Hot Flushes (also called hot flashes) - A hot flush is a sudden feeling of heat that spreads over the face and body. The skin may redden like a blush. It is connected with sweats and sleep disturbance. Women going through menopause may have hot flushes a few times a month or several times per day depending on the woman.  Osteoporosis (bone loss)- Estrogen helps guard against bone loss. After menopause, a woman's bones slowly lose calcium and become weak and brittle. As a result, bones are more likely to break. The hip, wrist, and spine are affected most often. Hormone therapy can help slow bone loss after menopause. Weight bearing exercise and taking calcium with vitamin D also can help prevent bone loss. There are also medications that your caregiver can prescribe that can help prevent osteoporosis.  Vaginal Dryness - Loss of estrogen causes changes in the vagina. Its lining may become thin and dry. These changes can cause pain and bleeding during sexual intercourse. Dryness can also lead to infections. This can cause burning and itching. (Vaginal estrogen treatment can help relieve pain, itching, and dryness.)  Urinary Tract Infections are more common after menopause because of lack of estrogen. Some women also develop urinary incontinence because of low estrogen levels in the vagina and bladder.  Possible other benefits of estrogen include a positive effect on mood and short-term memory in women. RISKS AND COMPLICATIONS  Using estrogen alone without progesterone causes the lining of the uterus to grow. This increases the risk of lining of the uterus (endometrial) cancer. Your caregiver should give another hormone called progestin if you  have a uterus.  Women who take combined (estrogen and progestin) HT appear to have an increased risk of breast cancer. The risk appears to be small, but increases throughout the time that HT is taken.  Combined therapy also makes the breast tissue slightly denser which makes it harder to read mammograms (breast X-rays).  Combined, estrogen and progesterone therapy can be taken together every day, in which case there may be spotting of blood. HT therapy can be taken cyclically in which case you will have menstrual periods. Cyclically means HT is taken for a set amount of days, then not taken, then this process is repeated.  HT may increase the risk of stroke, heart attack, breast cancer and forming blood clots in your leg.  Transdermal estrogen (estrogen that is absorbed through the skin with a patch or a cream) may have more positive results with:  Cholesterol.  Blood pressure.  Blood clots. Having the following conditions may indicate you should not have HT:  Endometrial cancer.  Liver disease.  Breast cancer.  Heart disease.  History of blood clots.  Stroke. TREATMENT   If you choose to take HT and have a uterus, usually estrogen and progestin are prescribed.  Your caregiver will help you decide the best way  to take the medications.  Possible ways to take estrogen include:  Pills.  Patches.  Gels.  Sprays.  Vaginal estrogen cream, rings and tablets.  It is best to take the lowest dose possible that will help your symptoms and take them for the shortest period of time that you can.  Hormone therapy can help relieve some of the problems (symptoms) that affect women at menopause. Before making a decision about HT, talk to your caregiver about what is best for you. Be well informed and comfortable with your decisions. HOME CARE INSTRUCTIONS   Follow your caregivers advice when taking the medications.  A Pap test is done to screen for cervical cancer.  The first  Pap test should be done at age 46.  Between ages 18 and 12, Pap tests are repeated every 2 years.  Beginning at age 90, you are advised to have a Pap test every 3 years as long as your past 3 Pap tests have been normal.  Some women have medical problems that increase the chance of getting cervical cancer. Talk to your caregiver about these problems. It is especially important to talk to your caregiver if a new problem develops soon after your last Pap test. In these cases, your caregiver may recommend more frequent screening and Pap tests.  The above recommendations are the same for women who have or have not gotten the vaccine for HPV (Human Papillomavirus).  If you had a hysterectomy for a problem that was not a cancer or a condition that could lead to cancer, then you no longer need Pap tests. However, even if you no longer need a Pap test, a regular exam is a good idea to make sure no other problems are starting.   If you are between ages 46 and 38, and you have had normal Pap tests going back 10 years, you no longer need Pap tests. However, even if you no longer need a Pap test, a regular exam is a good idea to make sure no other problems are starting.   If you have had past treatment for cervical cancer or a condition that could lead to cancer, you need Pap tests and screening for cancer for at least 20 years after your treatment.  If Pap tests have been discontinued, risk factors (such as a new sexual partner) need to be re-assessed to determine if screening should be resumed.  Some women may need screenings more often if they are at high risk for cervical cancer.  Get mammograms done as per the advice of your caregiver. SEEK IMMEDIATE MEDICAL CARE IF:  You develop abnormal vaginal bleeding.  You have pain or swelling in your legs, shortness of breath, or chest pain.  You develop dizziness or headaches.  You have lumps or changes in your breasts or armpits.  You have  slurred speech.  You develop weakness or numbness of your arms or legs.  You have pain, burning, or bleeding when urinating.  You develop abdominal pain. Document Released: 03/03/2003 Document Revised: 08/27/2011 Document Reviewed: 06/21/2010 [facility] Patient Information 2015 [place], [place]. This information is not intended to replace advice given to you by your health care provider. Make sure you discuss any questions you have with your health care provider.

## 2014-04-20 LAB — PAP IG W/ RFLX HPV ASCU

## 2014-04-27 ENCOUNTER — Telehealth: Payer: Self-pay | Admitting: *Deleted

## 2014-04-27 NOTE — Telephone Encounter (Signed)
Per Dr. Delsa Sale patient needs vulvar biopsy. Contacted patient to attempt to schedule, patient states she is not ready to schedule at this time and will call back to schedule.

## 2014-05-04 ENCOUNTER — Ambulatory Visit: Payer: Medicare Other

## 2014-05-10 ENCOUNTER — Telehealth: Payer: Self-pay | Admitting: *Deleted

## 2014-05-11 NOTE — Telephone Encounter (Signed)
Contacted patient regarding MDL results. Patient states does this mean she doesn't have to have the vulvar biopsy. Patient notified that I would have to find out and the reason for the Bx but that these results didn't have to do with that. Patient voiced understanding. Patient states she had asked for bloodwork but did not have any drawn and now her insurance wont cover it. Patient notified that I would talk to billing and see what I could do. Patient voiced understanding.   CB: Patient notified that since her MDL results were negative we still needed to do the Bx so we could figure out why she was having the itching and irritation. 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 Patient would like to know if it could be the same thing, that they started bothering her around the same time. Patient uses Clobetasol ointment for her breast.   Please advise.   Patient also notified that we would have to charge a separate visit for her blood work because we had already billed for her previous appointment, patient notified that she could check with her PCP and she should be able to have a yearly exam with them as well and be able to get her bloodwork done that most insurances usually allow for both. Patient notified to check first before scheduling an appointment though so that she did not receive an unexpected charge. Patient also notified that next year when she came that if she was not sent for blood work to take herself to the lab and let the lab know she would like to have blood work drawn. Patient voiced understanding.

## 2014-05-20 NOTE — Telephone Encounter (Signed)
Patient notified that it could be the same diagnosis and that Dr. Delsa Sale states that it would be treated the same way but that we wouldn't be able to know for sure unless we did the biopsy and therefore Dr. Delsa Sale still recommends doing the Bx. Patient states she is waiting until she gets the bill back from her breast Bx so she can see how much she is going to have to pay on it and that it may be next year before she can do this Bx. Patient notified just to call us whenever she is ready for the Bx. Patient voiced understanding. Patient notified what the MDL was for as well.

## 2014-06-14 ENCOUNTER — Encounter: Payer: Self-pay | Admitting: *Deleted

## 2014-06-15 ENCOUNTER — Encounter: Payer: Self-pay | Admitting: Obstetrics & Gynecology

## 2014-06-17 ENCOUNTER — Ambulatory Visit
Admission: RE | Admit: 2014-06-17 | Discharge: 2014-06-17 | Disposition: A | Discharge status: Home / Self Care | Payer: Commercial Managed Care - HMO | Source: Ambulatory Visit | Attending: Obstetrics & Gynecology | Admitting: Obstetrics & Gynecology

## 2014-06-17 ENCOUNTER — Other Ambulatory Visit: Payer: Self-pay | Admitting: Obstetrics & Gynecology

## 2014-06-17 DIAGNOSIS — Z1231 Encounter for screening mammogram for malignant neoplasm of breast: Secondary | ICD-10-CM

## 2014-09-13 ENCOUNTER — Encounter: Payer: Self-pay | Admitting: *Deleted

## 2014-09-13 NOTE — Patient Outreach (Signed)
CSW was able to make contact with patient today to follow-up regarding recent referral for counseling and supportive services.  CSW was able to obtain two HIPAA compliant identifiers from patient, which included patients name and date of birth.  CSW also obtained verbal consent from patient to converse with patients interdisciplinary team, as well as make referrals for patient to various community agencies and resources.  Patient admitted to [place] that she does "not have the desire to begin working with a new therapist", that she would prefer to re-establish care with her previous Nurse Case Manager with [facility], Mrs. Sandrea Hughs.  Patient went on to say that she had been working with Mrs. Harriston for two years and that she had come to trust Mrs. Harriston, as well as rely on her for various tasks that are often debilitating for patient to try and perform.  Patient was tearful throughout the phone conversation with CSW, as CSW was trying to provide supportive services and attentive listening skills, where appropriate.  CSW sympathized with patient, explaining that CSW will try and advocate to have patients care re-established with Mrs. Harriston. CSW agreed to contact Mrs. Harriston, as well as her supervisor, if necessary, to explain the severity of the situation.  Patient then provided CSW with the contact information for Mrs. Harriston.  CSW will contact Mrs. Harriston as soon as the call is terminated with patient. CSW inquired as to how CSW could be of further assistance to patient at this time.  Patient admitted that she needs to have a few teeth extracted, as well as obtain a new pair of prescription lens.  Patient went on to say that she simply cannot afford the added monthly expenses; therefore, she has been "putting it off".  Patient requested that CSW provide her with a list of resources that may be able to assist her with receiving the above-named services.  CSW spoke with patient at length about  services offered through [facility] for reduced cost dental work, as well as the [facility] for obtaining reduced cost or free prescription glasses.  CSW also agreed to mail information about the two programs, as well as a few others that may be able to assist patient, directly to patients home for her review.   CSW offered to provide counseling and supportive services to patient, while waiting to hear whether or not care can be re-established with Mrs. Harriston.  Pt. Declined.  CSW agreed to contact patient to report findings of conversation with Mrs. Harriston.  CSW was able to ensure that patient has the correct contact information for CSW, encouraging patient to contact CSW if assistance is needed in the meantime.  CSW will contact patient on April 4th, regardless, to ensure that patient has received the packet of information that CSW is mailing to her home.  Patient voiced understanding and was agreeable to this plan.  CSW will contact patients RNCM with [facility], Thea Silversmith to ensure that Mrs. Juleen China is aware of CSW's involvement with patient.

## 2014-09-14 ENCOUNTER — Ambulatory Visit
Admission: RE | Admit: 2014-09-14 | Discharge: 2014-09-14 | Disposition: A | Discharge status: Home / Self Care | Payer: Commercial Managed Care - HMO | Source: Ambulatory Visit | Attending: Nurse Practitioner | Admitting: Nurse Practitioner

## 2014-09-14 ENCOUNTER — Other Ambulatory Visit: Payer: Self-pay | Admitting: Nurse Practitioner

## 2014-09-14 DIAGNOSIS — M79672 Pain in left foot: Secondary | ICD-10-CM

## 2014-09-15 NOTE — Progress Notes (Deleted)
This encounter was created in error - please disregard.

## 2014-09-20 ENCOUNTER — Other Ambulatory Visit: Payer: Self-pay | Admitting: *Deleted

## 2014-09-20 NOTE — Patient Outreach (Signed)
[facility]) Care Management  [facility] Social Work  09/20/2014  Brittany [name] 07-Oct-1955 [id number]   Current Medications:  Current Outpatient Prescriptions  Medication Sig Dispense Refill  . busPIRone (BUSPAR) 5 MG tablet Take 5 mg by mouth 3 (three) times daily.    . QUEtiapine (SEROQUEL) 400 MG tablet Take 400 mg by mouth at bedtime.    . temazepam (RESTORIL) 30 MG capsule Take 30 mg by mouth at bedtime as needed for sleep.     No current facility-administered medications for this visit.    Functional Status:  In your present state of health, do you have any difficulty performing the following activities: 09/13/2014  Is the patient deaf or have difficulty hearing? N  Hearing N  Vision Y  Difficulty concentrating or making decisions N  Walking or climbing stairs? N  Doing errands, shopping? N  Preparing Food and eating ? N  Using the Toilet? N  In the past six months, have you accidently leaked urine? N  Do you have problems with loss of bowel control? N  Managing your Medications? N  Managing your Finances? N  Housekeeping or managing your Housekeeping? N    Fall/Depression Screening:  PHQ 2/9 Scores 09/13/2014  PHQ - 2 Score 3  PHQ- 9 Score 11    Assessment:  CSW was able to converse with patient at length today to ensure proper case closure was performed.  CSW was fist able to obtain two HIPAA compliant identifiers from patient, which included patients name and date of birth.  Patient appeared to be in good spirits today, optimistic and hopeful for the future.  Patient indicated that she saw her new therapist last week, Brittany [name] at [employer], and that she felt a "real connection".  Patient went on to say, "I realize it's a little premature, but I believe she is someone I will be able to work with".   CSW congratulated patient on her willingness to start a new therapeutic relationship, despite how difficult and painful this may be for  patient.  CSW explained to patient that CSW has been able to follow-up with Brittany [name], [place] Nurse with [facility], with whom patient had established a rapport with, as well as confided in, for the past two years. According to Brittany [name], she is diligently trying to obtain approval from her direct supervisor to continue to provide ongoing counseling and supportive services to patient.  Patient indicated that she had also received a call from Brittany [name] and is hopeful that her [employer] coverage will approve continued home visits.  If not, patient plans to write a letter to her local congressman.  Patient reports feeling strong enough and well enough to begin initiating a daily exercise routine.  Patient further reported that she is trying to be proactive with her current plan of care.  Patient has recently requested that her Primary Care Physician, Dr. Glendale [name] order a Bone Density Test, a Cardiology Consult and an Orthopedic Consult.  Patient is waiting to be contacted by the various physician offices to schedule the visits.  Patient is fearful of having an Aneurysm, as this is how she believes her mother died suddenly.  Patient admits to spend a great deal of her time worrying about things that have not even happened.  During these episodes, patient has been encouraged to continue to rely on the power of prayer, as patient often refers to herself as being a very religous person.  CSW and patient  were able to review the complete list of resource information that CSW mailed to her home.  CSW was able to answer questions, where appropriate.  CSW also offered guidance with regards to initiating phone calls to enlist herself in the various services offered.  Patient was able to outline her short-term and long-term goals, with confidence in being able to achieve them.  Patients short-term goal for today was to be able to clean her apartment.  CSW reminded patient to take baby steps,  only living one day at a time and keeping her Faith along the way.  Patient voiced understanding and was agreeable to this plan.  CSW inquired as to how CSW could be of additional assistance to patient at this time.  Patient denied being able to identify any social work specific needs at present.  CSW was able to ensure that patient has the correct contact information for CSW, encouraging patient to contact CSW directly if additional social work services are needed in the future.  CSW will notify patients RNCM with [facility], Brittany [name] of CSW's plans to close patients case.  CSW will also submit a case closure request to Brittany [name], Care Management Assistant with [facility], in the form of an [employer].  Brittany [name], BSW, MSW, [place] [place] [facility] [place], [place], [place] [address] [name] [phone]

## 2014-10-14 NOTE — Patient Outreach (Signed)
[facility]) Care Management  10/14/2014  Brittany [name] 06/05/1956 [id number]   Received notification from Nat Christen, CSW to close case due to goals met.  [name]. [place] CM Assistant Phone: [phone] Fax: [phone]

## 2014-11-01 ENCOUNTER — Ambulatory Visit: Payer: Commercial Managed Care - HMO | Admitting: Cardiovascular Disease

## 2014-11-14 NOTE — Progress Notes (Signed)
[place]  

## 2014-11-16 ENCOUNTER — Encounter: Payer: Commercial Managed Care - HMO | Admitting: Cardiovascular Disease

## 2015-01-31 ENCOUNTER — Ambulatory Visit: Payer: Commercial Managed Care - HMO | Admitting: Cardiovascular Disease

## 2015-02-08 NOTE — Progress Notes (Signed)
Patient ID: MYNA FREIMARK, female   DOB: 19-Feb-1956, 59 y.o.   MRN: [id number]     Cardiology Office Note   Date:  02/08/2015   ID:  Brittany [name], Brittany [name] 10-07-1955, MRN [id number]  PCP:  Maximino Greenland, MD  Cardiologist:   Jenkins Rouge, MD   No chief complaint on file.     History of Present Illness: Brittany [name] is a 59 y.o. female who presents for abnormal ECG.  Has multiple psychiatric diagnosis including Bipolar depression, anxiety , panic attacks and PTSD.   ECG shows RBBB/LAD/LAFB.  No old one to compare.  I took care of her mother Pamala Hurry she and brother have died from CAD.  Had AAA screening US with primary and negative.  She has occasional atypical chest pain sharp fleeting right sternal not always exertional.  Not pleuritic Quit smoking 17 years ago Compliant with meds.   Has some Asthma which is controlled with symbicort      Past Medical History  Diagnosis Date  . Bipolar depression   . Anxiety   . Panic attacks   . PTSD (post-traumatic stress disorder)   . Phobia   . Leg swelling   . Foot pain   . Insomnia   . Asthma   . Tachycardia   . Abnormal EKG     Past Surgical History  Procedure Laterality Date  . Cesarean section    . Ablation    . Tubal ligation    . Knee surgery    . Cholecystectomy    . Carpal tunnel release    . Ulnar nerve repair       Current Outpatient Prescriptions  Medication Sig Dispense Refill  . albuterol (PROVENTIL HFA;VENTOLIN HFA) 108 (90 BASE) MCG/ACT inhaler Inhale 2 puffs into the lungs every 6 (six) hours as needed for wheezing or shortness of breath.    . baclofen (LIORESAL) 10 MG tablet Take 10 mg by mouth 2 (two) times daily.    . busPIRone (BUSPAR) 5 MG tablet Take 5 mg by mouth 3 (three) times daily.    . QUEtiapine (SEROQUEL) 400 MG tablet Take 400 mg by mouth at bedtime.    . temazepam (RESTORIL) 30 MG capsule Take 30 mg by mouth at bedtime as needed for sleep.    Marland Kitchen triamcinolone (NASACORT) 55 MCG/ACT AERO  nasal inhaler Place 2 sprays into the nose daily.    Marland Kitchen triamterene-hydrochlorothiazide (MAXZIDE-25) 37.5-25 MG per tablet Take 1 tablet by mouth daily.    . Vitamin D, Ergocalciferol, (DRISDOL) 50000 UNITS CAPS capsule Take 50,000 Units by mouth every 7 (seven) days.     No current facility-administered medications for this visit.    Allergies:   Aspirin; Rofecoxib; Sulfamethoxazole-trimethoprim; and Trimethoprim    Social History:  The patient  reports that she quit smoking about 15 years ago. She does not have any smokeless tobacco history on file. She reports that she drinks alcohol. She reports that she does not use illicit drugs.   Family History:  The patient's family history includes Heart failure in her mother; Hyperlipidemia in her mother; Hypertension in her mother.    ROS:  Please see the history of present illness.   Otherwise, review of systems are positive for none.   All other systems are reviewed and negative.    PHYSICAL EXAM: VS:  There were no vitals taken for this visit. , BMI There is no weight on file to calculate BMI. Affect appropriate Healthy:  appears stated age  HEENT: normal Neck supple with no adenopathy JVP normal no bruits no thyromegaly Lungs clear with no wheezing and good diaphragmatic motion Heart:  S1/S2 no murmur, no rub, gallop or click PMI normal Abdomen: benighn, BS positve, no tenderness, no AAA no bruit.  No HSM or HJR Distal pulses intact with no bruits No edema Neuro non-focal Skin warm and dry No muscular weakness    EKG:   09/13/14  SR rate 87 RBBB LAFB  02/09/15  SR ate 74  LAD RBBB    Recent Labs: No results found for requested labs within last 365 days.    Lipid Panel No results found for: CHOL, TRIG, HDL, CHOLHDL, VLDL, LDLCALC, LDLDIRECT    Wt Readings from Last 3 Encounters:  04/19/14 94.348 kg (208 lb)  04/26/10 92.761 kg (204 lb 8 oz)  10/27/09 91.173 kg (201 lb)      Other studies Reviewed: Additional  studies/ records that were reviewed today include: [facility] including ECG and [place] report s.    ASSESSMENT AND PLAN:  1.  Chest Pain: atypical abnormal ECG strong family history f/u stress myovue 2. Abnormal ECG:  RBBB LAFB no high grade AV block yearly ECG  EF by gated SPECT 3. Bipolar:  Continue Lithium avoid diuretics 4. Asthma:  No active wheezing continue albuterol/symbicort    Current medicines are reviewed at length with the patient today.  The patient does not have concerns regarding medicines.  The following changes have been made:  no change  Labs/ tests ordered today include:  Exercise myovue  No orders of the defined types were placed in this encounter.     Disposition:   FU with me PRN     Signed, Jenkins Rouge, MD  02/08/2015 8:10 AM    [facility] [place], [place], [place]  [address] Phone: [phone]; Fax: [phone]

## 2015-02-09 ENCOUNTER — Encounter: Payer: Self-pay | Admitting: Cardiovascular Disease

## 2015-02-09 ENCOUNTER — Ambulatory Visit (INDEPENDENT_AMBULATORY_CARE_PROVIDER_SITE_OTHER): Payer: Commercial Managed Care - HMO | Admitting: Cardiovascular Disease

## 2015-02-09 ENCOUNTER — Telehealth (HOSPITAL_COMMUNITY): Payer: Self-pay

## 2015-02-09 VITALS — BP 118/82 | HR 74 | Ht 61.5 in | Wt 180.6 lb

## 2015-02-09 DIAGNOSIS — R079 Chest pain, unspecified: Secondary | ICD-10-CM | POA: Diagnosis not present

## 2015-02-09 NOTE — Telephone Encounter (Signed)
Spoke with the patient while she was in the grocery store. The patient gave me permission to call back and leave instructions on her cell phone. Left message on voicemail per DPR in reference to upcoming appointment scheduled on 02-11-2015 at 0745 with detailed instructions given per Myocardial Perfusion Study Information Sheet for the test. LM to arrive 15 minutes early, and that it is imperative to arrive on time for appointment to keep from having the test rescheduled.Phone number given for call back for any questions. Brittany [name], [name]

## 2015-02-09 NOTE — Patient Instructions (Signed)
Medication Instructions:  NONE  Labwork: NONE  Testing/Procedures: Your physician has requested that you have en exercise stress myoview. For further information please visit [email or web address]. Please follow instruction sheet, as given.   Follow-Up: Your physician wants you to follow-up in: [place] will receive a reminder letter in the mail two months in advance. If you don't receive a letter, please call our office to schedule the follow-up appointment.  Any Other Special Instructions Will Be Listed Below (If Applicable).

## 2015-02-11 ENCOUNTER — Ambulatory Visit (HOSPITAL_COMMUNITY): Payer: Commercial Managed Care - HMO | Attending: Cardiovascular Disease

## 2015-02-11 DIAGNOSIS — R0609 Other forms of dyspnea: Secondary | ICD-10-CM | POA: Diagnosis not present

## 2015-02-11 DIAGNOSIS — R9439 Abnormal result of other cardiovascular function study: Secondary | ICD-10-CM | POA: Diagnosis not present

## 2015-02-11 DIAGNOSIS — Z8249 Family history of ischemic heart disease and other diseases of the circulatory system: Secondary | ICD-10-CM | POA: Insufficient documentation

## 2015-02-11 DIAGNOSIS — I4510 Unspecified right bundle-branch block: Secondary | ICD-10-CM | POA: Diagnosis not present

## 2015-02-11 DIAGNOSIS — R079 Chest pain, unspecified: Secondary | ICD-10-CM | POA: Diagnosis not present

## 2015-02-11 DIAGNOSIS — F17200 Nicotine dependence, unspecified, uncomplicated: Secondary | ICD-10-CM | POA: Insufficient documentation

## 2015-02-11 DIAGNOSIS — R002 Palpitations: Secondary | ICD-10-CM | POA: Insufficient documentation

## 2015-02-11 LAB — MYOCARDIAL PERFUSION IMAGING
CHL CUP MPHR: 161 {beats}/min
CHL CUP RESTING HR STRESS: 79 {beats}/min
CSEPEDS: 0 s
CSEPEW: 7 METS
Exercise duration (min): 6 min
LVDIAVOL: 67 mL
LVSYSVOL: 17 mL
NUC STRESS TID: 0.99
Peak HR: 151 {beats}/min
Percent HR: 93 %
RATE: 0.25
SDS: 4
SRS: 1
SSS: 4

## 2015-02-11 MED ORDER — TECHNETIUM TC 99M SESTAMIBI GENERIC - CARDIOLITE
32.5000 | Freq: Once | INTRAVENOUS | Status: AC | PRN
  Administered 2015-02-11: 33 via INTRAVENOUS

## 2015-02-11 MED ORDER — TECHNETIUM TC 99M SESTAMIBI GENERIC - CARDIOLITE
10.6000 | Freq: Once | INTRAVENOUS | Status: AC | PRN
  Administered 2015-02-11: 11 via INTRAVENOUS

## 2015-02-18 ENCOUNTER — Other Ambulatory Visit: Payer: Self-pay | Admitting: Family

## 2015-02-18 ENCOUNTER — Ambulatory Visit
Admission: RE | Admit: 2015-02-18 | Discharge: 2015-02-18 | Disposition: A | Discharge status: Home / Self Care | Payer: Commercial Managed Care - HMO | Source: Ambulatory Visit | Attending: Family | Admitting: Family

## 2015-02-18 DIAGNOSIS — R05 Cough: Secondary | ICD-10-CM

## 2015-02-18 DIAGNOSIS — R059 Cough, unspecified: Secondary | ICD-10-CM

## 2015-04-27 ENCOUNTER — Ambulatory Visit: Payer: Self-pay | Admitting: Cardiovascular Disease

## 2015-05-18 NOTE — Progress Notes (Signed)
Patient ID: Brittany [name], female   DOB: July 29, 1955, 59 y.o.   MRN: [id number]     Cardiology Office Note   Date:  05/19/2015   ID:  KYRIN STOHLMAN, DOB 1955/09/24, MRN [id number]  PCP:  Maximino Greenland, MD  Cardiologist:   Jenkins Rouge, MD   Chief Complaint  Patient presents with  . Annual Exam    no refills      History of Present Illness: Tionne KHANI PRECHT is a 59 y.o. female who presents for abnormal ECG.  Has multiple psychiatric diagnosis including Bipolar depression, anxiety , panic attacks and PTSD.   ECG shows RBBB/LAD/LAFB.  No old one to compare.  I took care of her mother Pamala Hurry she and brother have died from CAD.  Had AAA screening US with primary and negative.  She has occasional atypical chest pain sharp fleeting right sternal not always exertional.  Not pleuritic Quit smoking 17 years ago Compliant with meds.   Has some Asthma which is controlled with symbicort     Myovue 02/11/15 reviewed no ischemia diaphragmatic attenuation EF 74%   Started on lithium June 2016 with benefit .  Sees psychologist monthly and psychiatrist every 6 months    Past Medical History  Diagnosis Date  . Bipolar depression ([place])   . Anxiety   . Panic attacks   . PTSD (post-traumatic stress disorder)   . Phobia   . Leg swelling   . Foot pain   . Insomnia   . Asthma   . Tachycardia   . Abnormal EKG     Past Surgical History  Procedure Laterality Date  . Cesarean section    . Ablation    . Tubal ligation    . Knee surgery    . Cholecystectomy    . Carpal tunnel release    . Ulnar nerve repair       Current Outpatient Prescriptions  Medication Sig Dispense Refill  . albuterol (PROVENTIL HFA;VENTOLIN HFA) 108 (90 BASE) MCG/ACT inhaler Inhale 2 puffs into the lungs every 6 (six) hours as needed for wheezing or shortness of breath.    . Ascorbic Acid (VITAMIN C PO) Take 1 tablet by mouth daily.    . budesonide-formoterol (SYMBICORT) 160-4.5 MCG/ACT inhaler Inhale 2 puffs into  the lungs 2 (two) times daily.    . busPIRone (BUSPAR) 10 MG tablet Take 10 mg by mouth 3 (three) times daily.    Marland Kitchen CALCIUM PO Take 1 tablet by mouth daily.    . Cholecalciferol (VITAMIN D-3 PO) Take 1 tablet by mouth daily.    . DiphenhydrAMINE HCl (BENADRYL PO) Take 1 tablet by mouth 2 (two) times daily.    . Ibuprofen (IBU-200 PO) Take 1-3 tablets by mouth daily as needed (for pain).    Marland Kitchen lamoTRIgine (LAMICTAL) 200 MG tablet Take 125 mg by mouth daily.     Marland Kitchen lithium carbonate 300 MG capsule Take 300 mg by mouth daily.    Marland Kitchen LORazepam (ATIVAN) 0.5 MG tablet Take 0.5-1 mg by mouth 2 (two) times daily as needed for anxiety.    . montelukast (SINGULAIR) 10 MG tablet Take 10 mg by mouth at bedtime.    . Multiple Vitamins-Minerals (MULTIVITAMIN WITH MINERALS) tablet Take 1 tablet by mouth daily.    Marland Kitchen POTASSIUM PO Take 1 tablet by mouth daily.    . temazepam (RESTORIL) 30 MG capsule Take 30 mg by mouth at bedtime as needed for sleep.     No current facility-administered medications  for this visit.    Allergies:   Aspirin; Rofecoxib; Sulfamethoxazole-trimethoprim; and Trimethoprim    Social History:  The patient  reports that she quit smoking about 15 years ago. She does not have any smokeless tobacco history on file. She reports that she drinks alcohol. She reports that she does not use illicit drugs.   Family History:  The patient's family history includes Heart failure in her mother; Hyperlipidemia in her mother; Hypertension in her mother.    ROS:  Please see the history of present illness.   Otherwise, review of systems are positive for none.   All other systems are reviewed and negative.    PHYSICAL EXAM: VS:  BP 112/80 mmHg  Pulse 83  Ht 5\' 2"  (1.575 m)  Wt 80.795 kg (178 lb 1.9 oz)  BMI 32.57 kg/m2 , BMI Body mass index is 32.57 kg/(m^2). Affect appropriate Overweight white female  HEENT: normal Neck supple with no adenopathy JVP normal no bruits no thyromegaly Lungs clear  with no wheezing and good diaphragmatic motion Heart:  S1/S2 no murmur, no rub, gallop or click PMI normal Abdomen: benighn, BS positve, no tenderness, no AAA no bruit.  No HSM or HJR Distal pulses intact with no bruits No edema Neuro non-focal Skin warm and dry No muscular weakness    EKG:   09/13/14  SR rate 87 RBBB LAFB  02/09/15  SR ate 74  LAD RBBB    Recent Labs: No results found for requested labs within last 365 days.    Lipid Panel No results found for: CHOL, TRIG, HDL, CHOLHDL, VLDL, LDLCALC, LDLDIRECT    Wt Readings from Last 3 Encounters:  05/19/15 80.795 kg (178 lb 1.9 oz)  02/11/15 81.647 kg (180 lb)  02/09/15 81.92 kg (180 lb 9.6 oz)      Other studies Reviewed: Additional studies/ records that were reviewed today include: [facility] including ECG and [place] report s.    ASSESSMENT AND PLAN:  1.  Chest Pain: atypical abnormal ECG strong family history normal myovue 02/11/15   2. Abnormal ECG:  RBBB LAFB no high grade AV block yearly ECG  EF normal  by gated SPECT 3. Bipolar:  Continue Lithium avoid diuretics 4. Asthma:  No active wheezing continue albuterol/symbicort    Current medicines are reviewed at length with the patient today.  The patient does not have concerns regarding medicines.  The following changes have been made:  no change  Labs/ tests ordered today include:   No orders of the defined types were placed in this encounter.     Disposition:   FU with me  In a year     Signed, Jenkins Rouge, MD  05/19/2015 4:30 PM    [facility] [address], [place], [place]  [address] Phone: [phone]; Fax: [phone]

## 2015-05-19 ENCOUNTER — Ambulatory Visit (INDEPENDENT_AMBULATORY_CARE_PROVIDER_SITE_OTHER): Payer: Commercial Managed Care - HMO | Admitting: Cardiovascular Disease

## 2015-05-19 ENCOUNTER — Encounter: Payer: Self-pay | Admitting: Cardiovascular Disease

## 2015-05-19 VITALS — BP 112/80 | HR 83 | Ht 62.0 in | Wt 178.1 lb

## 2015-05-19 DIAGNOSIS — Z0000 Encounter for general adult medical examination without abnormal findings: Secondary | ICD-10-CM

## 2015-05-19 NOTE — Patient Instructions (Signed)

## 2015-06-03 ENCOUNTER — Other Ambulatory Visit: Payer: Self-pay

## 2015-06-03 DIAGNOSIS — Z1231 Encounter for screening mammogram for malignant neoplasm of breast: Secondary | ICD-10-CM

## 2015-07-06 ENCOUNTER — Ambulatory Visit
Admission: RE | Admit: 2015-07-06 | Discharge: 2015-07-06 | Disposition: A | Discharge status: Home / Self Care | Payer: Commercial Managed Care - HMO | Source: Ambulatory Visit

## 2015-07-06 DIAGNOSIS — Z1231 Encounter for screening mammogram for malignant neoplasm of breast: Secondary | ICD-10-CM

## 2015-07-21 LAB — HM PAP SMEAR: HM Pap smear: NEGATIVE

## 2016-03-01 ENCOUNTER — Institutional Professional Consult (permissible substitution): Payer: Self-pay | Admitting: Internal Medicine

## 2016-04-12 ENCOUNTER — Institutional Professional Consult (permissible substitution): Payer: Self-pay | Admitting: Internal Medicine

## 2016-05-11 ENCOUNTER — Institutional Professional Consult (permissible substitution): Payer: Self-pay | Admitting: Internal Medicine

## 2016-06-21 ENCOUNTER — Encounter: Payer: Self-pay | Admitting: Internal Medicine

## 2016-06-21 ENCOUNTER — Ambulatory Visit (INDEPENDENT_AMBULATORY_CARE_PROVIDER_SITE_OTHER): Payer: PPO | Admitting: Internal Medicine

## 2016-06-21 ENCOUNTER — Ambulatory Visit (INDEPENDENT_AMBULATORY_CARE_PROVIDER_SITE_OTHER)
Admission: RE | Admit: 2016-06-21 | Discharge: 2016-06-21 | Disposition: A | Discharge status: Home / Self Care | Payer: PPO | Source: Ambulatory Visit | Attending: Internal Medicine | Admitting: Internal Medicine

## 2016-06-21 VITALS — BP 120/78 | HR 98 | Ht 62.0 in | Wt 172.6 lb

## 2016-06-21 DIAGNOSIS — R05 Cough: Secondary | ICD-10-CM | POA: Diagnosis not present

## 2016-06-21 DIAGNOSIS — J45991 Cough variant asthma: Secondary | ICD-10-CM | POA: Diagnosis not present

## 2016-06-21 DIAGNOSIS — J449 Chronic obstructive pulmonary disease, unspecified: Secondary | ICD-10-CM | POA: Diagnosis not present

## 2016-06-21 DIAGNOSIS — R0602 Shortness of breath: Secondary | ICD-10-CM | POA: Diagnosis not present

## 2016-06-21 LAB — NITRIC OXIDE: NITRIC OXIDE: 7

## 2016-06-21 MED ORDER — FAMOTIDINE 20 MG PO TABS: ORAL_TABLET | ORAL | 2 refills | Status: DC

## 2016-06-21 MED ORDER — BUDESONIDE-FORMOTEROL FUMARATE 80-4.5 MCG/ACT IN AERO: 2.0000 | INHALATION_SPRAY | Freq: Two times a day (BID) | RESPIRATORY_TRACT | 0 refills | Status: DC

## 2016-06-21 MED ORDER — OMEPRAZOLE 20 MG PO CPDR: DELAYED_RELEASE_CAPSULE | ORAL | Status: DC

## 2016-06-21 MED ORDER — BUDESONIDE-FORMOTEROL FUMARATE 80-4.5 MCG/ACT IN AERO: 2.0000 | INHALATION_SPRAY | Freq: Two times a day (BID) | RESPIRATORY_TRACT | 11 refills | Status: DC

## 2016-06-21 NOTE — Progress Notes (Signed)
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 Pulmonary Consult    Referred by Dr. Glendale Chard for asthma. Pt has seen Dr Gwenette Greet in the past. She states her breathing is overall doing well. She does note some cough that she relates to sinus drainage. She also c/o occ tightness in her throat- occurs when out in the cold, windy weather.   really not on maint rx since late summer of 2017 but worse cough/ throat tightness  with severe cold weather and despite singulair  still needs antihistamines year round for constant daytime sensation of pnds some sneezing and HB as well  Prev allergy rx per [place] at [place] stopped shots 8 y prior to [place]  Did not think they helped  Typically also set off by very hot or very cold weather or perfume Presently cough day= noct for sev weeks mostly just dry  And hacking and no better with symbicort 160 2 bid with poor hfa    No obvious other patterns in day to day or daytime variabilty or assoc chronic cough or cp or chest tightness, subjective wheeze. No unusual exp hx or h/o childhood pna/ asthma or knowledge of premature birth.  Sleeping ok without nocturnal  or early am exacerbation  of respiratory  c/o's or need for noct saba. Also denies any obvious fluctuation of symptoms with weather or environmental changes or other aggravating or alleviating factors except as outlined above   Current Medications, Allergies, Complete Past Medical History, Past Surgical History, Family History, and Social History were reviewed  in [employer] record.            Review of Systems  Constitutional: Negative for chills, fever and unexpected weight change.  HENT: Positive for congestion, dental problem, postnasal drip and sneezing. Negative for ear pain, nosebleeds, rhinorrhea, sinus pressure, sore throat, trouble swallowing and voice change.   Eyes: Negative for visual disturbance.  Respiratory: Positive for cough. Negative for choking and shortness of breath.   Cardiovascular: Negative for chest pain and leg swelling.  Gastrointestinal: Negative for abdominal pain, diarrhea and vomiting.       Acid heartburn  Indigestion  Genitourinary: Negative for difficulty urinating.  Musculoskeletal: Negative for arthralgias.  Skin: Negative for rash.  Neurological: Negative for tremors, syncope and headaches.  Hematological: Does not bruise/bleed easily.       Objective:   Physical Exam  Wt Readings from Last 3 Encounters:  06/21/16 172 lb 9.6 oz (78.3 kg)  05/19/15 178 lb 1.9 oz (80.8 kg)  02/11/15 180 lb (81.6 kg)     amb pleasant female nad/ some harsh upper airway dry coughing  Vital signs reviewed - Note on arrival 02 sats  94% on RA     HEENT: nl dentition, turbinates, and oropharynx. Nl external ear canals without cough reflex   NECK :  without JVD/Nodes/TM/ nl carotid upstrokes bilaterally   LUNGS:  no acc muscle use,  Nl contour chest which is clear to A and P bilaterally without cough on insp or exp maneuvers   CV:  RRR  no s3 or murmur or increase in P2, nad no edema   ABD:  soft and nontender with nl inspiratory excursion in the supine position. No bruits or organomegaly appreciated, bowel sounds nl  MS:  Nl gait/ ext warm without deformities, calf tenderness, cyanosis or clubbing No obvious joint restrictions   SKIN: warm and dry without lesions    NEURO:  alert, approp, nl sensorium with  no motor or cerebellar deficits apparent.      CXR PA and Lateral:    06/21/2016 :    I personally reviewed images and agree with radiology impression as follows:    No active cardiopulmonary disease.      Assessment & Plan:

## 2016-06-21 NOTE — Progress Notes (Signed)
Called and left a detailed msg with results

## 2016-06-21 NOTE — Patient Instructions (Addendum)
Omeprazole Take 30-60 min before first meal of the day  And add Pepcid 20 mg at bedtime along with For drainage / throat tickle try take CHLORPHENIRAMINE  4 mg - take one every 4 hours as needed - available over the counter- may cause drowsiness so start with just a bedtime dose or two and see how you tolerate it before trying in daytime    Reduce symbicort to 80 Take 2 puffs first thing in am and then another 2 puffs about 12 hours later.   Work on inhaler technique:  relax and gently blow all the way out then take a nice smooth deep breath back in, triggering the inhaler at same time you start breathing in.  Hold for up to 5 seconds if you can. Blow out thru nose. Rinse and gargle with water when done    GERD (REFLUX)  is an extremely common cause of respiratory symptoms just like yours , many times with no obvious heartburn at all.    It can be treated with medication, but also with lifestyle changes including elevation of the head of your bed (ideally with 6 inch  bed blocks),  Smoking cessation, avoidance of late meals, excessive alcohol, and avoid fatty foods, chocolate, peppermint, colas, red wine, and acidic juices such as orange juice.  NO MINT OR MENTHOL PRODUCTS SO NO COUGH DROPS   USE SUGARLESS CANDY INSTEAD (Jolley ranchers or Stover's or Life Savers) or even ice chips will also do - the key is to swallow to prevent all throat clearing. NO OIL BASED VITAMINS - use powdered substitutes.    Please remember to go to the   x-ray department downstairs for your tests - we will call you with the results when they are available.  Please schedule a follow up office visit in 2 weeks, sooner if needed  - bring all active medications with you  Needs feno on return

## 2016-06-22 DIAGNOSIS — J449 Chronic obstructive pulmonary disease, unspecified: Secondary | ICD-10-CM | POA: Insufficient documentation

## 2016-06-22 NOTE — Assessment & Plan Note (Addendum)
FENO 06/21/2016  =   7 on symb 160 2bid - 06/21/2016  After extensive coaching HFA effectiveness =    75% reduce symbicort to 80 2bid as main complaint is cough   The most common causes of chronic cough in immunocompetent adults include the following: upper airway cough syndrome (UACS), previously referred to as postnasal drip syndrome (PNDS), which is caused by variety of rhinosinus conditions; (2) asthma; (3) GERD; (4) chronic bronchitis from cigarette smoking or other inhaled environmental irritants; (5) nonasthmatic eosinophilic bronchitis; and (6) bronchiectasis.   These conditions, singly or in combination, have accounted for up to 94% of the causes of chronic cough in prospective studies.   Other conditions have constituted no >6% of the causes in prospective studies These have included bronchogenic carcinoma, chronic interstitial pneumonia, sarcoidosis, left ventricular failure, ACEI-induced cough, and aspiration from a condition associated with pharyngeal dysfunction.    Chronic cough is often simultaneously caused by more than one condition. A single cause has been found from 38 to 82% of the time, multiple causes from 18 to 62%. Multiply caused cough has been the result of three diseases up to 42% of the time.       ddx here is between cough variant asthma and Upper airway cough syndrome (previously labeled PNDS) , is  so named because it's frequently impossible to sort out how much is  CR/sinusitis with freq throat clearing (which can be related to primary GERD)   vs  causing  secondary (" extra esophageal")  GERD from wide swings in gastric pressure that occur with throat clearing, often  promoting self use of mint and menthol lozenges that reduce the lower esophageal sphincter tone and exacerbate the problem further in a cyclical fashion.   These are the same pts (now being labeled as having "irritable larynx syndrome" by some cough centers) who not infrequently have a history of having  failed to tolerate ace inhibitors,  dry powder inhalers or biphosphonates or report having atypical/extraesophageal reflux symptoms that don't respond to standard doses of PPI  and are easily confused as having aecopd or asthma flares by even experienced allergists/ pulmonologists (myself included).   rec lower symbicort to 80 bid and max rx for gerd then regroup in 2 weeks - will need full pfts in f/u as well to sort out copd vs asthma vs ACOS and also a trust but verify approach here based on previous "failed therapies"  - instructed in writing: with all meds in hand using a trust but verify approach to confirm accurate Medication  Reconciliation The principal here is that until we are certain that the  patients are doing what we've asked, it makes no sense to ask them to do more.   Total time devoted to counseling  > 50 % of 60 min new pt office visit:  review case with pt/ discussion of options/alternatives/ personally creating written customized instructions  in presence of pt  then going over those specific  Instructions directly with the pt including how to use all of the meds but in particular covering each new medication in detail and the difference between the maintenance/automatic meds and the prns using an action plan format for the latter.  Please see AVS from this visit for a full list of these instructions which I personally wrote for this pt and  are unique to this visit.

## 2016-06-22 NOTE — Assessment & Plan Note (Signed)
Spirometry 12/29/01   FEV1 2.10 (83%)  Ratio 68 p 22% improvement from saba - Spirometry 06/21/2016  FEV1 1.35 (57%)  Ratio 62 on symbicort 160 2bid     Her symptoms are mostly cough now, not sob, so the lower strength of symbicort should do just as well here but needs FENO on return on this dose to be sure any inflammatory/ allergic component is being addressed

## 2016-07-05 ENCOUNTER — Encounter: Payer: Self-pay | Admitting: Adult Health

## 2016-07-16 ENCOUNTER — Ambulatory Visit (INDEPENDENT_AMBULATORY_CARE_PROVIDER_SITE_OTHER): Payer: PPO | Admitting: Adult Health

## 2016-07-16 ENCOUNTER — Encounter: Payer: Self-pay | Admitting: Adult Health

## 2016-07-16 DIAGNOSIS — J449 Chronic obstructive pulmonary disease, unspecified: Secondary | ICD-10-CM

## 2016-07-16 LAB — NITRIC OXIDE: Nitric Oxide: 14

## 2016-07-16 MED ORDER — BUDESONIDE-FORMOTEROL FUMARATE 80-4.5 MCG/ACT IN AERO: 2.0000 | INHALATION_SPRAY | Freq: Two times a day (BID) | RESPIRATORY_TRACT | 0 refills | Status: DC

## 2016-07-16 MED ORDER — ALBUTEROL SULFATE 108 (90 BASE) MCG/ACT IN AEPB: 2.0000 | INHALATION_SPRAY | Freq: Four times a day (QID) | RESPIRATORY_TRACT | 0 refills | Status: DC | PRN

## 2016-07-16 MED ORDER — BUDESONIDE-FORMOTEROL FUMARATE 80-4.5 MCG/ACT IN AERO: 2.0000 | INHALATION_SPRAY | Freq: Two times a day (BID) | RESPIRATORY_TRACT | 6 refills | Status: DC

## 2016-07-16 NOTE — Progress Notes (Signed)
'@Patient'  ID: Brittany [name], female    DOB: 12-16-1955, 61 y.o.   MRN: [id number]  Chief Complaint  Patient presents with  . Follow-up    Asthma     Referring provider: Glendale Chard, MD  HPI: 61 yo female former smoker (quit 2001)  seen for initial pulmonary consult for asthma 06/21/16   TEST  FENO 06/21/2016  =   7 on symb 160 2bid  Spirometry showed Moderate airflow obstruction with FEV1 57%, ratio 62, FVC 71.   07/16/2016 Follow up : Asthma  Pt returns for 3 week follow up . Seen last ov for pulmonary consult for asthma . She was started on Chlortrimeton, PPI/Pepcid and Symbicort was decreased 80/4.40mg . FENO last ov was 7. Spirometry last ov showed moderate obstruction .   She is feeling some better. Has noticed about 50% decreased cough /tightness. Minimal albuterol use.  Feels rainy cold weather makes her worse.  FENO today is 14.  We reviewed her meds and updated her MAR. She is taking her meds correctly.      Allergies  Allergen Reactions  . Aspirin Other (See Comments)    discomfort  . Rofecoxib     Unknown per pt  . Sulfamethoxazole-Trimethoprim Other (See Comments)    Unknown reaction per pt  . Trimethoprim Other (See Comments)    Unknown, per pt    Immunization History  Administered Date(s) Administered  . Influenza Split 04/26/2011  . Influenza Whole 03/18/2009, 03/18/2010, 03/18/2016  . Influenza,inj,Quad PF,36+ Mos 04/19/2014  . Pneumococcal Polysaccharide-23 06/18/2005    Past Medical History:  Diagnosis Date  . Abnormal EKG   . Anxiety   . Asthma   . Bipolar depression ([place]   . Foot pain   . Insomnia   . Leg swelling   . Panic attacks   . Phobia   . PTSD (post-traumatic stress disorder)   . Tachycardia     Tobacco History: History  Smoking Status  . Former Smoker  . Quit date: 06/19/1999  Smokeless Tobacco  . Never Used   Counseling given: Not Answered   Outpatient Encounter Prescriptions as of 07/16/2016  Medication Sig  .  albuterol (PROVENTIL) (2.5 MG/3ML) 0.083% nebulizer solution Take 2.5 mg by nebulization every 6 (six) hours as needed for wheezing or shortness of breath.  . ALPRAZolam (XANAX) 0.5 MG tablet Take 0.25-0.5 mg by mouth daily as needed for anxiety.  . Ascorbic Acid (VITAMIN C PO) Take 1 tablet by mouth daily.  . budesonide-formoterol (SYMBICORT) 80-4.5 MCG/ACT inhaler Inhale 2 puffs into the lungs 2 (two) times daily.  . busPIRone (BUSPAR) 15 MG tablet Take 15 mg by mouth 3 (three) times daily.  .Marland KitchenCALCIUM PO Take 1 tablet by mouth daily.  . chlorpheniramine (CHLOR-TRIMETON) 4 MG tablet Take 4 mg by mouth every 4 (four) hours as needed for allergies.  . Cholecalciferol (VITAMIN D-3 PO) Take 1 tablet by mouth daily.  .Marland Kitchendextromethorphan-guaiFENesin (MUCINEX DM) 30-600 MG 12hr tablet Take 1 tablet by mouth 2 (two) times daily as needed for cough.  . famotidine (PEPCID) 20 MG tablet One at bedtime  . Ibuprofen (IBU-200 PO) Take 1-3 tablets by mouth daily as needed (for pain).  .Marland Kitchenipratropium (ATROVENT) 0.03 % nasal spray Place 2 sprays into both nostrils every 12 (twelve) hours.  .Marland Kitchenloratadine (ALLERGY) 10 MG tablet Take 10 mg by mouth daily as needed for allergies.  . montelukast (SINGULAIR) 10 MG tablet Take 10 mg by mouth at bedtime.  .Marland Kitchen  Multiple Vitamins-Minerals (MULTIVITAMIN WITH MINERALS) tablet Take 1 tablet by mouth daily.  Marland Kitchen omeprazole (PRILOSEC) 20 MG capsule Take 30-60 min before first meal of the day  . [DISCONTINUED] busPIRone (BUSPAR) 10 MG tablet Take 10 mg by mouth 3 (three) times daily.  . budesonide-formoterol (SYMBICORT) 80-4.5 MCG/ACT inhaler Inhale 2 puffs into the lungs 2 (two) times daily.   No facility-administered encounter medications on file as of 07/16/2016.      Review of Systems  Constitutional:   No  weight loss, night sweats,  Fevers, chills, fatigue, or  lassitude.  HEENT:   No headaches,  Difficulty swallowing,  Tooth/dental problems, or  Sore throat,                 No sneezing, itching, ear ache,  +nasal congestion, post nasal drip,   CV:  No chest pain,  Orthopnea, PND, swelling in lower extremities, anasarca, dizziness, palpitations, syncope.   GI  No heartburn, indigestion, abdominal pain, nausea, vomiting, diarrhea, change in bowel habits, loss of appetite, bloody stools.   Resp:   No chest wall deformity  Skin: no rash or lesions.  GU: no dysuria, change in color of urine, no urgency or frequency.  No flank pain, no hematuria   MS:  No joint pain or swelling.  No decreased range of motion.  No back pain.    Physical Exam  BP 138/78   Pulse 83   Temp 98.8 F (37.1 C) (Oral)   Ht 5' 1.5" (1.562 m)   Wt 179 lb 3.2 oz (81.3 kg)   SpO2 99%   BMI 33.31 kg/m   GEN: A/Ox3; pleasant , NAD, well nourished    HEENT:  [place]/AT,  EACs-clear, TMs-wnl, NOSE-clear, THROAT-clear, no lesions, no postnasal drip or exudate noted.   NECK:  Supple w/ fair ROM; no JVD; normal carotid impulses w/o bruits; no thyromegaly or nodules palpated; no lymphadenopathy.    RESP  Clear  P & A; w/o, wheezes/ rales/ or rhonchi. no accessory muscle use, no dullness to percussion  CARD:  RRR, no m/r/g, no peripheral edema, pulses intact, no cyanosis or clubbing.  GI:   Soft & nt; nml bowel sounds; no organomegaly or masses detected.   Musco: Warm bil, no deformities or joint swelling noted.   Neuro: alert, no focal deficits noted.    Skin: Warm, no lesions or rashes  Psych:  No change in mood or affect. No depression or anxiety.  No memory loss.  Lab Results:  CBC    Component Value Date/Time   WBC 8.0 04/21/2009 0400   RBC 3.88 04/21/2009 0400   HGB 12.8 04/21/2009 0400   HCT 36.6 04/21/2009 0400   PLT 266 04/21/2009 0400   MCV 94.4 04/21/2009 0400   MCHC 35.0 04/21/2009 0400   RDW 12.7 04/21/2009 0400    BMET    Component Value Date/Time   NA 137 04/21/2009 0400   K 3.8 04/21/2009 0400   CL 102 04/21/2009 0400   CO2 31 04/21/2009 0400    GLUCOSE 102 (H) 04/21/2009 0400   BUN 4 (L) 04/21/2009 0400   CREATININE 0.60 04/21/2009 0400   CALCIUM 8.7 04/21/2009 0400   GFRNONAA >60 04/21/2009 0400   GFRAA  04/21/2009 0400    >60        The eGFR has been calculated using the MDRD equation. This calculation has not been validated in all clinical situations. eGFR's persistently <60 mL/min signify possible Chronic Kidney Disease.  BNP No results found for: BNP  ProBNP No results found for: PROBNP  Imaging: Dg Chest 2 View  Result Date: 06/21/2016 CLINICAL DATA:  Cough and shortness of breath for 2 weeks, initial encounter EXAM: CHEST  2 VIEW COMPARISON:  02/18/2015 FINDINGS: Cardiac shadow is within normal limits. The lungs are well aerated bilaterally. No focal infiltrate or sizable effusion is seen. No acute bony abnormality is noted. IMPRESSION: No active cardiopulmonary disease. Electronically Signed   By: Inez Catalina M.D.   On: 06/21/2016 13:12     Assessment & Plan:   COPD / AB vs ACOS COPD with asthma component appears to be improving with current regimen with tx aimed at trigger control of AR/GERD  Plan  Patient Instructions  Continue on Symbicort 80 2 puffs Twice daily  , rinse after use.  Continue on current regimen .  Follow up Dr. Melvyn Novas  In 2 months and As needed          Rexene Edison, NP 07/16/2016

## 2016-07-16 NOTE — Progress Notes (Signed)
Chart and office note reviewed in detail  > agree with a/p as outlined    

## 2016-07-16 NOTE — Addendum Note (Signed)
Addended by: Doroteo Glassman D on: 07/16/2016 12:34 PM   Modules accepted: Orders

## 2016-07-16 NOTE — Assessment & Plan Note (Signed)
COPD with asthma component appears to be improving with current regimen with tx aimed at trigger control of AR/GERD  Plan  Patient Instructions  Continue on Symbicort 80 2 puffs Twice daily  , rinse after use.  Continue on current regimen .  Follow up Dr. Melvyn Novas  In 2 months and As needed

## 2016-07-16 NOTE — Patient Instructions (Signed)
Continue on Symbicort 80 2 puffs Twice daily  , rinse after use.  Continue on current regimen .  Follow up Dr. Melvyn Novas  In 2 months and As needed

## 2016-07-19 DIAGNOSIS — F3132 Bipolar disorder, current episode depressed, moderate: Secondary | ICD-10-CM | POA: Diagnosis not present

## 2016-08-22 NOTE — Progress Notes (Signed)
HPI:  Brittany [name] is here to establish care. Last PCP was Dr. Tye Savoy. She is transferring due to location and felt that her needs were not met. She will be due for a wellness visit and a physical seen. She has the following medical problems:  Obesity, hyperlipidemia, osteoarthritis: -some minimal exercise -diet ok -psych meds have increased her weight -she is interested in healthy lifestyle  Osteoporosis: -early menopause in early 55s -had dexa about 2-3 years ago in prior PCP office -sensitive to med, doing vit d and calcium -wants to recheck dexa  Asthma/GERD: -seeing pulmonologist, Dr. Melvyn Novas  Bipolar disorder/anxiety/panic disorder: -[facility] -meds: olanzapine, buspirone, alprazolam -on disability for psych issues  ROS negative for unless reported above: fevers, unintentional weight loss, hearing or vision loss, chest pain, palpitations, struggling to breath, hemoptysis, melena, hematochezia, hematuria, falls, loc, si, thoughts of self harm  Past Medical History:  Diagnosis Date  . Abnormal EKG   . Asthma   . Bipolar depression ([place]), Anxiety, PTSD, Panic disorder    -managed by [facility]  . Foot pain   . Insomnia   . Leg swelling   . Osteoporosis   . Tachycardia     Past Surgical History:  Procedure Laterality Date  . ABLATION    . CARPAL TUNNEL RELEASE    . CESAREAN SECTION    . CHOLECYSTECTOMY    . KNEE SURGERY    . TUBAL LIGATION    . ULNAR NERVE REPAIR      Family History  Problem Relation Age of Onset  . Heart failure Mother   . Hypertension Mother   . Hyperlipidemia Mother     Social History   Social History  . Marital status: Divorced    Spouse name: N/A  . Number of children: N/A  . Years of education: N/A   Social History Main Topics  . Smoking status: Former Smoker    Quit date: 06/19/1999  . Smokeless tobacco: Never Used  . Alcohol use 0.0 oz/week      Comment: Occass.   . Drug use: No  . Sexual activity: No   Other Topics Concern  . None   Social History Narrative   Work or School: Disabled 2ndary to psychiatric conditions      Home Situation: lives alone with 2 cats and one dog      Spiritual Beliefs: Christian      Lifestyle: no regular exercise, diet  Not great - wants to embark on healthier lifestyle        Current Outpatient Prescriptions:  .  albuterol (PROVENTIL) (2.5 MG/3ML) 0.083% nebulizer solution, Take 2.5 mg by nebulization every 6 (six) hours as needed for wheezing or shortness of breath., Disp: , Rfl:  .  Albuterol Sulfate (PROAIR RESPICLICK) 169 (90 Base) MCG/ACT AEPB, Inhale 2 puffs into the lungs every 6 (six) hours as needed., Disp: 1 each, Rfl: 0 .  ALPRAZolam (XANAX) 0.5 MG tablet, Take 0.25-0.5 mg by mouth daily as needed for anxiety., Disp: , Rfl:  .  Ascorbic Acid (VITAMIN C PO), Take 1 tablet by mouth daily., Disp: , Rfl:  .  budesonide-formoterol (SYMBICORT) 80-4.5 MCG/ACT inhaler, Inhale 2 puffs into the lungs 2 (two) times daily., Disp: 10.2 g, Rfl: 0 .  busPIRone (BUSPAR) 15 MG tablet, Take 15 mg by mouth 3 (three) times daily., Disp: , Rfl:  .  CALCIUM PO, Take 1 tablet by mouth daily., Disp: ,  Rfl:  .  chlorpheniramine (CHLOR-TRIMETON) 4 MG tablet, Take 4 mg by mouth every 4 (four) hours as needed for allergies., Disp: , Rfl:  .  Cholecalciferol (VITAMIN D-3 PO), Take 1 tablet by mouth daily., Disp: , Rfl:  .  dextromethorphan-guaiFENesin (MUCINEX DM) 30-600 MG 12hr tablet, Take 1 tablet by mouth 2 (two) times daily as needed for cough., Disp: , Rfl:  .  famotidine (PEPCID) 20 MG tablet, One at bedtime, Disp: 30 tablet, Rfl: 2 .  Ibuprofen (IBU-200 PO), Take 1-3 tablets by mouth daily as needed (for pain)., Disp: , Rfl:  .  ipratropium (ATROVENT) 0.03 % nasal spray, Place 2 sprays into both nostrils every 12 (twelve) hours., Disp: , Rfl:  .  loratadine (ALLERGY) 10 MG tablet, Take 10 mg by mouth  daily as needed for allergies., Disp: , Rfl:  .  montelukast (SINGULAIR) 10 MG tablet, Take 10 mg by mouth at bedtime., Disp: , Rfl:  .  Multiple Vitamins-Minerals (MULTIVITAMIN WITH MINERALS) tablet, Take 1 tablet by mouth daily., Disp: , Rfl:  .  omeprazole (PRILOSEC) 20 MG capsule, Take 30-60 min before first meal of the day, Disp: , Rfl:   EXAM:  Vitals:   08/23/16 1124  BP: 120/80  Pulse: 94  Temp: 98.3 F (36.8 C)    Body mass index is 35.02 kg/m.  GENERAL: vitals reviewed and listed above, alert, oriented, appears well hydrated and in no acute distress  HEENT: atraumatic, conjunttiva clear, no obvious abnormalities on inspection of external nose and ears  NECK: no obvious masses on inspection  LUNGS: clear to auscultation bilaterally, no wheezes, rales or rhonchi, good air movement  CV: HRRR, no peripheral edema  MS: moves all extremities without noticeable abnormality  PSYCH: pleasant and cooperative, no obvious depression or anxiety  ASSESSMENT AND PLAN:  Discussed the following assessment and plan:  Osteoporosis, unspecified osteoporosis type, unspecified pathological fracture presence - Plan: DG Bone Density  Cough variant asthma  vs UACS   Gastroesophageal reflux disease, esophagitis presence not specified  Bipolar affective disorder, current episode depressed, current episode severity unspecified (HCC)  BMI 35.0-35.9,adult  Osteoarthritis, unspecified osteoarthritis type, unspecified site  Hyperlipidemia, unspecified hyperlipidemia type  Chronic post-traumatic stress disorder (PTSD) -We reviewed the PMH, PSH, FH, SH, Meds and Allergies. -We provided refills for any medications we will prescribe as needed. -We addressed current concerns per orders and patient instructions. -We have asked for records for pertinent exams, studies, vaccines and notes from previous providers. -We have advised patient to follow up per instructions below.   -Patient  advised to return or notify a doctor immediately if symptoms worsen or persist or new concerns arise.  Patient Instructions  BEFORE YOU LEAVE: -bone density at [place] for osteoporosis -follow up: 1) Medicare Exam with susan when convenient for her in next 3 months 2) CPE with pap with Dr. Kim in 3 months  It was very nice to meet you today!   We recommend the following healthy lifestyle for LIFE: 1) Small portions.   Tip: eat off of a salad plate instead of a dinner plate.  Tip: It is ok to feel hungry after a meal of proper portion sizes.  Tip: if you need more or a snack choose fruits, veggies and/or a handful of nuts or seeds.  2) Eat a healthy clean diet.  * Tip: Avoid (less then 1 serving per week): processed foods, sweets, sweetened drinks, white starches (rice, flour, bread, potatoes, pasta, etc), red meat, fast foods,   butter  *Tip: CHOOSE instead   * 5-9 servings per day of fresh or frozen fruits and vegetables (but not corn, potatoes, bananas, canned or dried fruit)   *nuts and seeds, beans   *olives and olive oil   *small portions of lean meats such as fish and white chicken    *small portions of whole grains  3)Get at least 150 minutes of sweaty aerobic exercise per week.  4)Reduce stress - consider counseling, meditation and relaxation to balance other aspects of your life.  WE NOW OFFER   [facility] FAST TRACK!!!  SAME DAY Appointments for ACUTE CARE  Such as: Sprains, Injuries, cuts, abrasions, rashes, muscle pain, joint pain, back pain Colds, flu, sore throats, headache, allergies, cough, fever  Ear pain, sinus and eye infections Abdominal pain, nausea, vomiting, diarrhea, upset stomach Animal/insect bites  3 Easy Ways to Schedule: Walk-In Scheduling Call in scheduling Mychart Sign-up: [email or web address]             Colin Benton R.

## 2016-08-23 ENCOUNTER — Ambulatory Visit (INDEPENDENT_AMBULATORY_CARE_PROVIDER_SITE_OTHER): Payer: PPO | Admitting: Family Medicine

## 2016-08-23 ENCOUNTER — Encounter: Payer: Self-pay | Admitting: Family Medicine

## 2016-08-23 VITALS — BP 120/80 | HR 94 | Temp 98.3°F | Ht 61.5 in | Wt 188.4 lb

## 2016-08-23 DIAGNOSIS — Z6835 Body mass index (BMI) 35.0-35.9, adult: Secondary | ICD-10-CM | POA: Diagnosis not present

## 2016-08-23 DIAGNOSIS — M1990 Unspecified osteoarthritis, unspecified site: Secondary | ICD-10-CM

## 2016-08-23 DIAGNOSIS — F4312 Post-traumatic stress disorder, chronic: Secondary | ICD-10-CM | POA: Insufficient documentation

## 2016-08-23 DIAGNOSIS — E785 Hyperlipidemia, unspecified: Secondary | ICD-10-CM | POA: Diagnosis not present

## 2016-08-23 DIAGNOSIS — F3130 Bipolar disorder, current episode depressed, mild or moderate severity, unspecified: Secondary | ICD-10-CM | POA: Diagnosis not present

## 2016-08-23 DIAGNOSIS — K219 Gastro-esophageal reflux disease without esophagitis: Secondary | ICD-10-CM

## 2016-08-23 DIAGNOSIS — M810 Age-related osteoporosis without current pathological fracture: Secondary | ICD-10-CM

## 2016-08-23 DIAGNOSIS — J45991 Cough variant asthma: Secondary | ICD-10-CM | POA: Diagnosis not present

## 2016-08-23 NOTE — Patient Instructions (Signed)
BEFORE YOU LEAVE: -bone density at [place] for osteoporosis -follow up: 1) Medicare Exam with susan when convenient for her in next 3 months 2) CPE with pap with Dr. Maudie Mercury in 3 months  It was very nice to meet you today!   We recommend the following healthy lifestyle for LIFE: 1) Small portions.   Tip: eat off of a salad plate instead of a dinner plate.  Tip: It is ok to feel hungry after a meal of proper portion sizes.  Tip: if you need more or a snack choose fruits, veggies and/or a handful of nuts or seeds.  2) Eat a healthy clean diet.  * Tip: Avoid (less then 1 serving per week): processed foods, sweets, sweetened drinks, white starches (rice, flour, bread, potatoes, pasta, etc), red meat, fast foods, butter  *Tip: CHOOSE instead   * 5-9 servings per day of fresh or frozen fruits and vegetables (but not corn, potatoes, bananas, canned or dried fruit)   *nuts and seeds, beans   *olives and olive oil   *small portions of lean meats such as fish and white chicken    *small portions of whole grains  3)Get at least 150 minutes of sweaty aerobic exercise per week.  4)Reduce stress - consider counseling, meditation and relaxation to balance other aspects of your life.  WE NOW OFFER   [facility] FAST TRACK!!!  SAME DAY Appointments for ACUTE CARE  Such as: Sprains, Injuries, cuts, abrasions, rashes, muscle pain, joint pain, back pain Colds, flu, sore throats, headache, allergies, cough, fever  Ear pain, sinus and eye infections Abdominal pain, nausea, vomiting, diarrhea, upset stomach Animal/insect bites  3 Easy Ways to Schedule: Walk-In Scheduling Call in scheduling Mychart Sign-up: [email or web address]

## 2016-08-23 NOTE — Progress Notes (Signed)
Pre visit review using our clinic review tool, if applicable. No additional management support is needed unless otherwise documented below in the visit note. 

## 2016-08-27 ENCOUNTER — Ambulatory Visit (INDEPENDENT_AMBULATORY_CARE_PROVIDER_SITE_OTHER)
Admission: RE | Admit: 2016-08-27 | Discharge: 2016-08-27 | Disposition: A | Discharge status: Home / Self Care | Payer: PPO | Source: Ambulatory Visit | Attending: Family Medicine | Admitting: Family Medicine

## 2016-08-27 DIAGNOSIS — M810 Age-related osteoporosis without current pathological fracture: Secondary | ICD-10-CM

## 2016-09-11 ENCOUNTER — Encounter: Payer: Self-pay | Admitting: Family Medicine

## 2016-09-12 DIAGNOSIS — Z79899 Other long term (current) drug therapy: Secondary | ICD-10-CM | POA: Diagnosis not present

## 2016-09-13 ENCOUNTER — Ambulatory Visit (INDEPENDENT_AMBULATORY_CARE_PROVIDER_SITE_OTHER): Payer: PPO | Admitting: Internal Medicine

## 2016-09-13 ENCOUNTER — Encounter: Payer: Self-pay | Admitting: Internal Medicine

## 2016-09-13 VITALS — BP 138/88 | HR 96 | Ht 61.5 in | Wt 182.6 lb

## 2016-09-13 DIAGNOSIS — J45991 Cough variant asthma: Secondary | ICD-10-CM

## 2016-09-13 MED ORDER — MONTELUKAST SODIUM 10 MG PO TABS: 10.0000 mg | ORAL_TABLET | Freq: Every day | ORAL | 11 refills | Status: DC

## 2016-09-13 NOTE — Patient Instructions (Addendum)
Plan A = Automatic = stop symicort and keep taking montelukast daily   Plan B = Backup Only use your albuterol as a rescue medication to be used if you can't catch your breath by resting or doing a relaxed purse lip breathing pattern.  - The less you use it, the better it will work when you need it. - Ok to use the inhaler up to 2 puffs  every 4 hours if you must but call for appointment if use goes up over your usual need - Don't leave home without it !!  (think of it like the spare tire for your car)   Plan C = Crisis - only use your albuterol nebulizer if you first try Plan B and it fails to help > ok to use the nebulizer up to every 4 hours but if start needing it regularly call for immediate appointment    If you are satisfied with your treatment plan,  let your doctor know and he/she can either refill your medications or you can return here when your prescription runs out.     If in any way you are not 100% satisfied,  please tell us.  If 100% better, tell your friends!  Pulmonary follow up is as needed

## 2016-09-13 NOTE — Progress Notes (Signed)
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 Pulmonary Consult    Referred by Dr. [place] [name] for asthma. Pt has seen Dr Brittany [name] in the past. She states her breathing is overall doing well. She does note some cough that she relates to sinus drainage. She also c/o occ tightness in her throat- occurs when out in the cold, windy weather.   really not on maint rx since late summer of 2017 but worse cough/ throat tightness  with severe cold weather and despite singulair  still needs antihistamines year round for constant daytime sensation of pnds some sneezing and HB as well  Prev allergy rx per [place] at [place] stopped shots 8 y prior to [facility]  Did not think they helped  Typically also set off by very hot or very cold weather or perfume Presently cough day= noct for sev weeks mostly just dry  And hacking and no better with symbicort 160 2 bid with poor hfa  rec Omeprazole Take 30-60 min before first meal of the day  And add Pepcid 20 mg at bedtime along with For drainage / throat tickle try take CHLORPHENIRAMINE  4 mg - take one every 4 hours as needed - available over the counter- may cause drowsiness so start with just a bedtime dose or two and see how you tolerate it before trying in daytime   Reduce symbicort to 80 Take 2 puffs first thing in am and then another 2 puffs about 12 hours later.  Work on inhaler technique:  relax and gently blow all the way out then take a nice smooth deep breath back in, triggering the inhaler at  same time you start breathing in.  Hold for up to 5 seconds if you can. Blow out thru nose. Rinse and gargle with water when done GERD (REFLUX)  is an extremely common     09/13/2016  f/u ov/Brittany [name] re: uacs /vcd   Better since reduced symbicort / still taking singulair / did not bring meds as req Chief Complaint  Patient presents with  . Follow-up    Breathing is overall doing well. She states throat congestion seems worse.  She has not needed albuterol inhaler or neb.    Still sense of something stuck in her throat and doe = MMRC1 = can walk nl pace, flat grade, can't hurry or go uphills or steps s sob    No obvious day to day or daytime variability or assoc excess/ purulent sputum or mucus plugs or hemoptysis or cp or chest tightness, subjective wheeze or overt sinus or hb symptoms. No unusual exp hx or h/o childhood pna/ asthma or knowledge of premature birth.  Sleeping ok without nocturnal  or early am exacerbation  of respiratory  c/o's or need for noct saba. Also denies any obvious fluctuation of symptoms with weather or environmental changes or other aggravating or alleviating factors except as outlined  above   Current Medications, Allergies, Complete Past Medical History, Past Surgical History, Family History, and Social History were reviewed in [employer] record.  ROS  The following are not active complaints unless bolded sore throat, dysphagia, dental problems, itching, sneezing,  nasal congestion or excess/ purulent secretions, ear ache,   fever, chills, sweats, unintended wt loss, classically pleuritic or exertional cp,  orthopnea pnd or leg swelling, presyncope, palpitations, abdominal pain, anorexia, nausea, vomiting, diarrhea  or change in bowel or bladder habits, change in stools or urine, dysuria,hematuria,  rash, arthralgias, visual complaints, headache, numbness, weakness or ataxia or problems with walking or coordination,  change in mood/affect or  memory.                       Objective:   Physical Exam  09/13/2016        182  06/21/16 172 lb 9.6 oz (78.3 kg)  05/19/15 178 lb 1.9 oz (80.8 kg)  02/11/15 180 lb (81.6 kg)     amb pleasant female nad   Vital signs reviewed - Note on arrival 02 sats  96% on RA     HEENT: nl dentition, turbinates, and oropharynx. Nl external ear canals without cough reflex   NECK :  without JVD/Nodes/TM/ nl carotid upstrokes bilaterally   LUNGS: no acc muscle use,  Nl contour chest which is clear to A and P bilaterally without cough on insp or exp maneuvers   CV:  RRR  no s3 or murmur or increase in P2, nad no edema   ABD:  soft and nontender with nl inspiratory excursion in the supine position. No bruits or organomegaly appreciated, bowel sounds nl  MS:  Nl gait/ ext warm without deformities, calf tenderness, cyanosis or clubbing No obvious joint restrictions   SKIN: warm and dry without lesions    NEURO:  alert, approp, nl sensorium with  no motor or cerebellar deficits apparent.      CXR PA and Lateral:   06/21/2016 :    I personally reviewed images and agree with radiology impression as follows:    No active cardiopulmonary disease.      Assessment & Plan:   Outpatient Encounter Prescriptions as of 09/13/2016  Medication Sig  . albuterol (PROVENTIL) (2.5 MG/3ML) 0.083% nebulizer solution Take 2.5 mg by nebulization every 6 (six) hours as needed for wheezing or shortness of breath.  . Albuterol Sulfate (PROAIR RESPICLICK) 270 (90 Base) MCG/ACT AEPB Inhale 2 puffs into the lungs every 6 (six) hours as needed.  . ALPRAZolam (XANAX) 0.5 MG tablet Take 0.25-0.5 mg by mouth daily as needed for anxiety.  . busPIRone (BUSPAR) 15 MG tablet Take 15 mg by mouth 3 (three) times daily.  Marland Kitchen CALCIUM PO Take 1 tablet by mouth daily.  . chlorpheniramine (CHLOR-TRIMETON) 4 MG tablet Take 4 mg by mouth every 4 (four) hours as needed for allergies.  . Cholecalciferol (VITAMIN D-3 PO) Take  1 tablet by mouth daily.  Marland Kitchen dextromethorphan-guaiFENesin (MUCINEX DM) 30-600 MG 12hr tablet Take 1 tablet by mouth 2 (two) times daily as needed for cough.  . famotidine (PEPCID) 20 MG tablet One at bedtime  . Ibuprofen (IBU-200 PO) Take 1-3 tablets by mouth daily as needed (for pain).  . metFORMIN (GLUCOPHAGE) 500 MG tablet Take 1 tablet by mouth daily.  . Multiple Vitamins-Minerals (MULTIVITAMIN WITH MINERALS) tablet Take 1 tablet by mouth daily.  Marland Kitchen OLANZapine (ZYPREXA) 5 MG tablet Take 1 tablet by mouth  daily.  . omeprazole (PRILOSEC) 20 MG capsule Take 30-60 min before first meal of the day  . [DISCONTINUED] budesonide-formoterol (SYMBICORT) 80-4.5 MCG/ACT inhaler Inhale 2 puffs into the lungs 2 (two) times daily.  . montelukast (SINGULAIR) 10 MG tablet Take 1 tablet (10 mg total) by mouth at bedtime.  . [DISCONTINUED] Ascorbic Acid (VITAMIN C PO) Take 1 tablet by mouth daily.  . [DISCONTINUED] ipratropium (ATROVENT) 0.03 % nasal spray Place 2 sprays into both nostrils every 12 (twelve) hours.  . [DISCONTINUED] loratadine (ALLERGY) 10 MG tablet Take 10 mg by mouth daily as needed for allergies.  . [DISCONTINUED] montelukast (SINGULAIR) 10 MG tablet Take 10 mg by mouth at bedtime.   No facility-administered encounter medications on file as of 09/13/2016.

## 2016-09-14 ENCOUNTER — Encounter: Payer: Self-pay | Admitting: Internal Medicine

## 2016-09-17 NOTE — Assessment & Plan Note (Signed)
FENO 06/21/2016  =   7 on symb 160 2bid  - 06/21/2016   reduce symbicort to 80 2bid as main complaint is cough   - 09/13/2016 trial off symbicort and maint on singulair  -  09/13/2016  After extensive coaching HFA effectiveness =    90%       I had an extended final summary discussion with the patient reviewing all relevant studies completed to date and  lasting 15 to 20 minutes of a 25 minute visit on the following issues:   If this is asthma, All goals of chronic asthma control met including optimal function and elimination of symptoms with minimal need for rescue therapy.  Contingencies discussed in full including contacting this office immediately if not controlling the symptoms using the rule of two's.     The "lump in her throat" is a classic globus sensation but has improved with lower dose symbicort and because the dx of asthma remains in doubt rec continue gerd rx/ singulair and try off symbicort - can restart immediately for any increase in symptoms or need for saba.  NB  saba should be hfa/ not respiclick, which could aggravate UACS/ VCD/ globus (all the same basic problem) if asthma starts to flare in future/ reviewed with pt  Each maintenance medication was reviewed in detail including most importantly the difference between maintenance and as needed and under what circumstances the prns are to be used.  Please see AVS for specific  Instructions which are unique to this visit and I personally typed out  which were reviewed in detail in writing with the patient and a copy provided.

## 2016-09-19 ENCOUNTER — Other Ambulatory Visit: Payer: Self-pay | Admitting: Internal Medicine

## 2016-10-04 ENCOUNTER — Other Ambulatory Visit: Payer: Self-pay | Admitting: Family Medicine

## 2016-10-04 DIAGNOSIS — Z1231 Encounter for screening mammogram for malignant neoplasm of breast: Secondary | ICD-10-CM

## 2016-10-09 ENCOUNTER — Other Ambulatory Visit: Payer: Self-pay | Admitting: Internal Medicine

## 2016-10-31 ENCOUNTER — Ambulatory Visit
Admission: RE | Admit: 2016-10-31 | Discharge: 2016-10-31 | Disposition: A | Discharge status: Home / Self Care | Payer: PPO | Source: Ambulatory Visit | Attending: Family Medicine | Admitting: Family Medicine

## 2016-10-31 DIAGNOSIS — Z1231 Encounter for screening mammogram for malignant neoplasm of breast: Secondary | ICD-10-CM

## 2016-11-12 ENCOUNTER — Other Ambulatory Visit: Payer: Self-pay | Admitting: Internal Medicine

## 2016-11-15 NOTE — Progress Notes (Signed)
Subjective:   Brittany [name] is a 61 y.o. female who presents for an Initial Medicare Annual Wellness Visit.  The Patient was informed that the wellness visit is to identify future health risk and educate and initiate measures that can reduce risk for increased disease through the lifespan.     NO ROS; Medicare Wellness Visit  Describes health as good, fair or great? Fair;  What would make it good? To lose 45 lbs  Exercise without being exhausted  Has to carry water around   Meds review; Anti-depressant was causing and fluid retention; Finally metformin was tx but dc when anti-depressant Zyprexa was dc; even though it helped mood; but this ran BP and weight up   Screening test up to date or reviewed for plan of completion Health Maintenance Due  Topic Date Due  . COLONOSCOPY  08/14/2005   No history of colonoscopy; had states she had one at 50 by Dr. Collene Mares.  Agrees to call and schedule apt with Dr. Collene Mares for fup this year    It took her 3 years to get a bone density   TDAP  Education provided regarding   Mammogram due 10/2017/ completed this year  Pap due 07/2018 -   Dexa 08/27/2016  (-2.1)  Education provided calcium; vit D and exercise  She thinks it may have improved from her first  Seen Dr. Maudie Mercury 08/2016 and again on 6/18 to review results  Preventive Risk Factors review:  Sees Dr. [facility] - chronic   Smoking history- quit 2001; quit 61 yo LDCT recommended if appropriate: waived due to length of time quit and smoking hx limited to 20 pack years or less  ETOH - rare  Educated on moderation if above norms   Diet Eating ketogenic; High protein; high good fats; avocados;  Eats a lot of tuna, salmon; limited  red meat  Lost 23lbs; lost the medicine  She gets full easier  Education and counseling on weight loss provided as well as tools to assist with weight reduction  Exercise  See goals; coached to increase [facility]  In a controlled climate, but lost transportation source for now   Dental work - yes  Given resources   Advanced aged > 61 in men; >65 in women Hyperlipidemia - not on file Diabetes -neg Family History - HF: HTN; Hyperlipidemia Obesity discussed weight loss   Medicare Screens   *Will try to complete AD; Given copy  So she can review Living Will language and see if she wants to add it to her own will; will send via [place]; lives in secured unit; walked the hallway in the winter  Has safe area; Reviewed sunscreen in the sun Firearm safety and does not drive currently   Hearing Screening Comments: Hearing issues  dtr questioned her hearing Would advise Dr. Maudie Mercury check your ears  Vision Screening Comments: Vision check was 2017 Dr. Ronnald Ramp;  Will have to go back to Dr. Alois Cliche  Recommend annual    Fall Risk  11/16/2016 09/13/2014  Falls in the past year? Yes No  Number falls in past yr: 1 -  Follow up Education provided -    Memory issues are evaluated in regard to the following:  Issues making decisions:  Less interest in hobbies / activities:  Repeats questions, stories (family complaining):  Trouble using ordinary gadgets (microwave, computer, phone):  Forgets the month or year:   Mismanaging finances:  Remembering appts:  Daily problems with thinking and/or memory:   It is helpful to do memory games; puzzles; Lumosity or brain teasers of your choice by book; online or other.     Patient Care Team: Lucretia Kern, DO as PCP - General (Family Medicine)          Objective:    Today's Vitals   11/16/16 1005  BP: 122/80  Pulse: 82  SpO2: 94%  Weight: 170 lb 8 oz (77.3 kg)  Height: 5' 1.5" (1.562 m)   Body mass index is 31.69 kg/m.   Current Medications (verified) Outpatient Encounter Prescriptions as of 11/16/2016  Medication Sig  . albuterol (PROVENTIL) (2.5 MG/3ML) 0.083% nebulizer solution Take 2.5 mg by nebulization every 6  (six) hours as needed for wheezing or shortness of breath.  . Albuterol Sulfate (PROAIR RESPICLICK) 962 (90 Base) MCG/ACT AEPB Inhale 2 puffs into the lungs every 6 (six) hours as needed.  . ALPRAZolam (XANAX) 0.5 MG tablet Take 0.25-0.5 mg by mouth daily as needed for anxiety.  . busPIRone (BUSPAR) 15 MG tablet Take 15 mg by mouth 3 (three) times daily.  Marland Kitchen CALCIUM PO Take 1 tablet by mouth daily.  . chlorpheniramine (CHLOR-TRIMETON) 4 MG tablet Take 4 mg by mouth every 4 (four) hours as needed for allergies.  . Cholecalciferol (VITAMIN D-3 PO) Take 1 tablet by mouth daily.  Marland Kitchen dextromethorphan-guaiFENesin (MUCINEX DM) 30-600 MG 12hr tablet Take 1 tablet by mouth 2 (two) times daily as needed for cough.  . famotidine (PEPCID) 20 MG tablet TAKE 1 TABLET BY MOUTH ONCE DAILY AT BEDTIME  . Ibuprofen (IBU-200 PO) Take 1-3 tablets by mouth daily as needed (for pain).  . montelukast (SINGULAIR) 10 MG tablet Take 1 tablet (10 mg total) by mouth at bedtime.  . Multiple Vitamins-Minerals (MULTIVITAMIN WITH MINERALS) tablet Take 1 tablet by mouth daily.  Marland Kitchen omeprazole (PRILOSEC) 20 MG capsule Take 30-60 min before first meal of the day  . risperiDONE (RISPERDAL) 0.25 MG tablet Take 0.25 mg by mouth at bedtime.  . metFORMIN (GLUCOPHAGE) 500 MG tablet Take 1 tablet by mouth daily.  Marland Kitchen OLANZapine (ZYPREXA) 5 MG tablet Take 1 tablet by mouth daily.  . [DISCONTINUED] famotidine (PEPCID) 20 MG tablet TAKE 1 TABLET BY MOUTH AT BEDTIME   No facility-administered encounter medications on file as of 11/16/2016.     Allergies (verified) Aspirin; Rofecoxib; Sulfamethoxazole-trimethoprim; and Trimethoprim   History: Past Medical History:  Diagnosis Date  . Abnormal EKG   . Asthma   . Bipolar depression ([place]), Anxiety, PTSD, Panic disorder    -managed by [facility]  . Foot pain   . Insomnia   . Leg swelling   . Osteoporosis   . Tachycardia       Past Surgical History:  Procedure Laterality Date   . ABLATION    . CARPAL TUNNEL RELEASE    . CESAREAN SECTION    . CHOLECYSTECTOMY    . KNEE SURGERY    . TUBAL LIGATION    . ULNAR NERVE REPAIR     Family History  Problem Relation Age of Onset  . Heart failure Mother   . Hypertension Mother   . Hyperlipidemia Mother   . Breast cancer Neg Hx    Social History   Occupational History  . Not on file.   Social History Main Topics  . Smoking status: Former Smoker    Packs/day: 20.00    Years: 1.00    Quit date: 06/19/1999  .  Smokeless tobacco: Never Used     Comment: 20 year estimate but probably less   . Alcohol use 0.0 oz/week     Comment: Occass.   . Drug use: No  . Sexual activity: No    Tobacco Counseling Counseling given: Yes   Activities of Daily Living In your present state of health, do you have any difficulty performing the following activities: 11/16/2016  Hearing? N  Vision? N  Difficulty concentrating or making decisions? N  Walking or climbing stairs? Y  Dressing or bathing? N  Doing errands, shopping? N  Preparing Food and eating ? N  Using the Toilet? N  In the past six months, have you accidently leaked urine? N  Do you have problems with loss of bowel control? N  Managing your Medications? N  Managing your Finances? N  Housekeeping or managing your Housekeeping? N  Some recent data might be hidden    Immunizations and Health Maintenance Immunization History  Administered Date(s) Administered  . Influenza Split 04/26/2011  . Influenza Whole 03/18/2009, 03/18/2010, 03/18/2016  . Influenza,inj,Quad PF,36+ Mos 04/19/2014  . Pneumococcal Conjugate-13 03/10/2015  . Pneumococcal Polysaccharide-23 06/18/2005     Patient Care Team: Lucretia Kern, DO as PCP - General (Family Medicine)      Assessment:   This is a routine wellness examination for Shatima Zalar  Current Exercise Habits: Home exercise routine, Type of exercise: walking  Goals    . Weight (lb) < 150 lb (68 kg)          Will  start walking!  Can walk in 55 plus community Would walk at 6:30 in am and pm  Benefits of doing this weight loss, strengthening lung and heart Energy; building up metabolism  Benefit of not doing it? Losing muscle tone and weight gain More sagging skin   Motivation 1-10 it is a 4 Why isn't it a 1 or 2; because you will get up and do what you can in your apt. What is the comparison of walking vs not walking? Picture the outcome of more inelasticity;  Can be better;   Will take inhaler prior to walking; start off slow as it has hills  Keep a log daily and see how you feel at certain temps or humidity         Depression Screen PHQ 2/9 Scores 11/16/2016 09/13/2014  PHQ - 2 Score 0 3  PHQ- 9 Score - 11    Fall Risk Fall Risk  11/16/2016 09/13/2014  Falls in the past year? Yes No  Number falls in past yr: 1 -  Follow up Education provided -    Cognitive Function: MMSE - Mini Mental State Exam 11/16/2016  Not completed: (No Data)        Screening Tests Health Maintenance  Topic Date Due  . COLONOSCOPY  08/14/2005  . TETANUS/TDAP  11/16/2017 (Originally 08/14/1974)  . Hepatitis C Screening  08/24/2026 (Originally Feb 05, 1956)  . HIV Screening  08/24/2026 (Originally 08/14/1970)  . INFLUENZA VACCINE  01/16/2017  . PAP SMEAR  07/20/2018  . DEXA SCAN  08/28/2018  . MAMMOGRAM  11/01/2018      Plan:   PCP Notes  Health Maintenance Discuss pap guidelines but would prefer to have pap every year  Will discuss with Dr. Maudie Mercury at June apt (pap due 2020 )   Agrees to call and schedule apt with Dr. Collene Mares for fup this year for colonoscopy   Agreed to take tetanus (tdap) at pharmacy in the  future; declined today due to OOP cost    Abnormal Screens  /dexa -2.1; referred to the osteoporosis [email or web address] to prepare for discussion with Dr. Maudie Mercury at her next apt in May   Referrals none  Patient concerns; Smells smoke; read that this may be related to sinus  Needs dental work and given  resources   Nurse Concerns; Doing well; coached regarding motivation to exercise Barrier fear of becoming ill or getting sob;  Educated to take inhaler prior to walk;  Also discussed motivation and factors   Next PCP apt 11/2017  I have personally reviewed and noted the following in the patient's chart:   . Medical and social history . Use of alcohol, tobacco or illicit drugs  . Current medications and supplements . Functional ability and status . Nutritional status . Physical activity . Advanced directives . List of other physicians . Hospitalizations, surgeries, and ER visits in previous 12 months . Vitals . Screenings to include cognitive, depression, and falls . Referrals and appointments  In addition, I have reviewed and discussed with patient certain preventive protocols, quality metrics, and best practice recommendations. A written personalized care plan for preventive services as well as general preventive health recommendations were provided to patient.   emmi education sent via email address given Osteoporosis Bone health Vit D and Calcium Advanced Directive  OHKGO,VPCHE, RN   11/16/2016

## 2016-11-16 ENCOUNTER — Ambulatory Visit (INDEPENDENT_AMBULATORY_CARE_PROVIDER_SITE_OTHER): Payer: PPO

## 2016-11-16 VITALS — BP 122/80 | HR 82 | Ht 61.5 in | Wt 170.5 lb

## 2016-11-16 DIAGNOSIS — Z0000 Encounter for general adult medical examination without abnormal findings: Secondary | ICD-10-CM | POA: Diagnosis not present

## 2016-11-16 NOTE — Progress Notes (Signed)
Devondre Guzzetta R., DO  

## 2016-11-16 NOTE — Patient Instructions (Addendum)
Ms. Stemler , Thank you for taking time to come for your Medicare Wellness Visit. I appreciate your ongoing commitment to your health goals. Please review the following plan we discussed and let me know if I can assist you in the future.   Will make apt for eye exam due to recent blurry vision  A Tetanus is recommended every 10 years. Medicare covers a tetanus if you have a cut or wound; otherwise, there may be a charge. If you had not had a tetanus with pertusses, known as the Tdap, you can take this anytime.   Recommendations for Dexa Scan; dr. Maudie Mercury to fup in June apt  Female over the age of 59 Man age 73 or older If you broke a bone past the age of 47 Women menopausal age with risk factors (thin frame; smoker; hx of fx ) Post menopausal women under the age of 50 with risk factors A man age 41 to 73 with risk factors Other: Spine xray that is showing break of bone loss Back pain with possible break Height loss of 1/2 inch or more within one year Total loss in height of 1.5 inches from your original height  Calcium 1233m with Vit D 800u per day; more as directed by physician Strength building exercises discussed; can include walking; housework; small weights or stretch bands; silver sneakers if access to the [facility]  Please go [place] the [place] [address] [place] [id number] Phone [phone] [place] [email or web address] Monthly drawing/ potential patients can call ([phone] have an application mailed to their residence within 10 days.     Given copy of [place] Advanced Directive; So she can review Living Will language and see if she wants to add it to her own will   Dr. KMaudie Mercuryto check ears during your visit Feels she may have some hearing loss   Try  to do kagel exercises; it take 20 times x 3 per day x 2 to 3 months   email [email or web address]    These are the goals we discussed: Goals    . Weight (lb) < 150 lb (68 kg)          Will start walking!  Can walk in 55 plus community Would walk at 6:30 in am and pm  Benefits of doing this weight loss, strengthening lung and heart Energy; building up metabolism  Benefit of not doing it? Losing muscle tone and weight gain More sagging skin   Motivation 1-10 it is a 4 Why isn't it a 1 or 2; because you will get up and do what you can in your apt. What is the comparison of walking vs not walking? Picture the outcome of more inelasticity;  Can be better;   Will take inhaler prior to walking; start off slow as it has hills  Keep a log daily and see how you feel at certain temps or humidity          This is a list of the screening recommended for you and due dates:  Health Maintenance  Topic Date Due  . Tetanus Vaccine  08/14/1974  . Colon Cancer Screening  08/14/2005  .  Hepatitis C: One time screening is recommended by Center for Disease Control  (CDC) for  adults born from [birth year]through 1965.   08/24/2026*  . HIV Screening  08/24/2026*  .  Flu Shot  01/16/2017  . Pap Smear  07/20/2018  . DEXA scan (bone density measurement)  08/28/2018  . Mammogram  11/01/2018  *Topic was postponed. The date shown is not the original due date.       Bone Densitometry Bone densitometry is an imaging test that uses a special X-ray to measure the amount of calcium and other minerals in your bones (bone density). This test is also known as a bone mineral density test or dual-energy X-ray absorptiometry (DXA). The test can measure bone density at your hip and your spine. It is similar to having a regular X-ray. You may have this test to:  Diagnose a condition that causes weak or thin bones (osteoporosis).  Predict your risk of a broken bone (fracture).  Determine how well osteoporosis  treatment is working.  Tell a health care provider about:  Any allergies you have.  All medicines you are taking, including vitamins, herbs, eye drops, creams, and over-the-counter medicines.  Any problems you or family members have had with anesthetic medicines.  Any blood disorders you have.  Any surgeries you have had.  Any medical conditions you have.  Possibility of pregnancy.  Any other medical test you had within the previous 14 days that used contrast material. What are the risks? Generally, this is a safe procedure. However, problems can occur and may include the following:  This test exposes you to a very small amount of radiation.  The risks of radiation exposure may be greater to unborn children.  What happens before the procedure?  Do not take any calcium supplements for 24 hours before having the test. You can otherwise eat and drink what you usually do.  Take off all metal jewelry, eyeglasses, dental appliances, and any other metal objects. What happens during the procedure?  You may lie on an exam table. There will be an X-ray generator below you and an imaging device above you.  Other devices, such as boxes or braces, may be used to position your body properly for the scan.  You will need to lie still while the machine slowly scans your body.  The images will show up on a computer monitor. What happens after the procedure? You may need more testing at a later time. This information is not intended to replace advice given to you by your health care provider. Make sure you discuss any questions you have with your health care provider. Document Released: 06/26/2004 Document Revised: 11/10/2015 Document Reviewed: 11/12/2013 Elsevier Interactive Patient Education  2018 [place] in the Home Falls can cause injuries. They can happen to people of all ages. There are many things you can do to make your home safe and to help prevent  falls. What can I do on the outside of my home?  Regularly fix the edges of walkways and driveways and fix any cracks.  Remove anything that might make you trip as you walk through a door, such as a raised step or threshold.  Trim any bushes or trees on the path to your home.  Use bright outdoor lighting.  Clear any walking paths of anything that might make someone trip, such as rocks or tools.  Regularly check to see if handrails are loose or broken. Make sure that both sides of any steps have handrails.  Any raised decks and porches should have guardrails on the edges.  Have any leaves, snow, or ice cleared regularly.  Use sand or salt on walking paths  during winter.  Clean up any spills in your garage right away. This includes oil or grease spills. What can I do in the bathroom?  Use night lights.  Install grab bars by the toilet and in the tub and shower. Do not use towel bars as grab bars.  Use non-skid mats or decals in the tub or shower.  If you need to sit down in the shower, use a plastic, non-slip stool.  Keep the floor dry. Clean up any water that spills on the floor as soon as it happens.  Remove soap buildup in the tub or shower regularly.  Attach bath mats securely with double-sided non-slip rug tape.  Do not have throw rugs and other things on the floor that can make you trip. What can I do in the bedroom?  Use night lights.  Make sure that you have a light by your bed that is easy to reach.  Do not use any sheets or blankets that are too big for your bed. They should not hang down onto the floor.  Have a firm chair that has side arms. You can use this for support while you get dressed.  Do not have throw rugs and other things on the floor that can make you trip. What can I do in the kitchen?  Clean up any spills right away.  Avoid walking on wet floors.  Keep items that you use a lot in easy-to-reach places.  If you need to reach something  above you, use a strong step stool that has a grab bar.  Keep electrical cords out of the way.  Do not use floor polish or wax that makes floors slippery. If you must use wax, use non-skid floor wax.  Do not have throw rugs and other things on the floor that can make you trip. What can I do with my stairs?  Do not leave any items on the stairs.  Make sure that there are handrails on both sides of the stairs and use them. Fix handrails that are broken or loose. Make sure that handrails are as long as the stairways.  Check any carpeting to make sure that it is firmly attached to the stairs. Fix any carpet that is loose or worn.  Avoid having throw rugs at the top or bottom of the stairs. If you do have throw rugs, attach them to the floor with carpet tape.  Make sure that you have a light switch at the top of the stairs and the bottom of the stairs. If you do not have them, ask someone to add them for you. What else can I do to help prevent falls?  Wear shoes that: ? Do not have high heels. ? Have rubber bottoms. ? Are comfortable and fit you well. ? Are closed at the toe. Do not wear sandals.  If you use a stepladder: ? Make sure that it is fully opened. Do not climb a closed stepladder. ? Make sure that both sides of the stepladder are locked into place. ? Ask someone to hold it for you, if possible.  Clearly mark and make sure that you can see: ? Any grab bars or handrails. ? First and last steps. ? Where the edge of each step is.  Use tools that help you move around (mobility aids) if they are needed. These include: ? Canes. ? Walkers. ? Scooters. ? Crutches.  Turn on the lights when you go into a dark area. Replace any light bulbs  as soon as they burn out.  Set up your furniture so you have a clear path. Avoid moving your furniture around.  If any of your floors are uneven, fix them.  If there are any pets around you, be aware of where they are.  Review your  medicines with your doctor. Some medicines can make you feel dizzy. This can increase your chance of falling. Ask your doctor what other things that you can do to help prevent falls. This information is not intended to replace advice given to you by your health care provider. Make sure you discuss any questions you have with your health care provider. Document Released: 03/31/2009 Document Revised: 11/10/2015 Document Reviewed: 07/09/2014 Elsevier Interactive Patient Education  2018 [place] Maintenance, Female Adopting a healthy lifestyle and getting preventive care can go a long way to promote health and wellness. Talk with your health care provider about what schedule of regular examinations is right for you. This is a good chance for you to check in with your provider about disease prevention and staying healthy. In between checkups, there are plenty of things you can do on your own. Experts have done a lot of research about which lifestyle changes and preventive measures are most likely to keep you healthy. Ask your health care provider for more information. Weight and diet Eat a healthy diet  Be sure to include plenty of vegetables, fruits, low-fat dairy products, and lean protein.  Do not eat a lot of foods high in solid fats, added sugars, or salt.  Get regular exercise. This is one of the most important things you can do for your health. ? Most adults should exercise for at least 150 minutes each week. The exercise should increase your heart rate and make you sweat (moderate-intensity exercise). ? Most adults should also do strengthening exercises at least twice a week. This is in addition to the moderate-intensity exercise.  Maintain a healthy weight  Body mass index (BMI) is a measurement that can be used to identify possible weight problems. It estimates body fat based on height and weight. Your health care provider can help determine your BMI and help you achieve or  maintain a healthy weight.  For females 55 years of age and older: ? A BMI below 18.5 is considered underweight. ? A BMI of 18.5 to 24.9 is normal. ? A BMI of 25 to 29.9 is considered overweight. ? A BMI of 30 and above is considered obese.  Watch levels of cholesterol and blood lipids  You should start having your blood tested for lipids and cholesterol at 61 years of age, then have this test every 5 years.  You may need to have your cholesterol levels checked more often if: ? Your lipid or cholesterol levels are high. ? You are older than 61 years of age. ? You are at high risk for heart disease.  Cancer screening Lung Cancer  Lung cancer screening is recommended for adults 68-24 years old who are at high risk for lung cancer because of a history of smoking.  A yearly low-dose CT scan of the lungs is recommended for people who: ? Currently smoke. ? Have quit within the past 15 years. ? Have at least a 30-pack-year history of smoking. A pack year is smoking an average of one pack of cigarettes a day for 1 year.  Yearly screening should continue until it has been 15 years since you quit.  Yearly screening should stop if you develop  a health problem that would prevent you from having lung cancer treatment.  Breast Cancer  Practice breast self-awareness. This means understanding how your breasts normally appear and feel.  It also means doing regular breast self-exams. Let your health care provider know about any changes, no matter how small.  If you are in your 20s or 30s, you should have a clinical breast exam (CBE) by a health care provider every 1-3 years as part of a regular health exam.  If you are 22 or older, have a CBE every year. Also consider having a breast X-ray (mammogram) every year.  If you have a family history of breast cancer, talk to your health care provider about genetic screening.  If you are at high risk for breast cancer, talk to your health care  provider about having an MRI and a mammogram every year.  Breast cancer gene (BRCA) assessment is recommended for women who have family members with BRCA-related cancers. BRCA-related cancers include: ? Breast. ? Ovarian. ? Tubal. ? Peritoneal cancers.  Results of the assessment will determine the need for genetic counseling and BRCA1 and BRCA2 testing.  Cervical Cancer Your health care provider may recommend that you be screened regularly for cancer of the pelvic organs (ovaries, uterus, and vagina). This screening involves a pelvic examination, including checking for microscopic changes to the surface of your cervix (Pap test). You may be encouraged to have this screening done every 3 years, beginning at age 70.  For women ages 68-65, health care providers may recommend pelvic exams and Pap testing every 3 years, or they may recommend the Pap and pelvic exam, combined with testing for human papilloma virus (HPV), every 5 years. Some types of HPV increase your risk of cervical cancer. Testing for HPV may also be done on women of any age with unclear Pap test results.  Other health care providers may not recommend any screening for nonpregnant women who are considered low risk for pelvic cancer and who do not have symptoms. Ask your health care provider if a screening pelvic exam is right for you.  If you have had past treatment for cervical cancer or a condition that could lead to cancer, you need Pap tests and screening for cancer for at least 20 years after your treatment. If Pap tests have been discontinued, your risk factors (such as having a new sexual partner) need to be reassessed to determine if screening should resume. Some women have medical problems that increase the chance of getting cervical cancer. In these cases, your health care provider may recommend more frequent screening and Pap tests.  Colorectal Cancer  This type of cancer can be detected and often prevented.  Routine  colorectal cancer screening usually begins at 61 years of age and continues through 61 years of age.  Your health care provider may recommend screening at an earlier age if you have risk factors for colon cancer.  Your health care provider may also recommend using home test kits to check for hidden blood in the stool.  A small camera at the end of a tube can be used to examine your colon directly (sigmoidoscopy or colonoscopy). This is done to check for the earliest forms of colorectal cancer.  Routine screening usually begins at age 3.  Direct examination of the colon should be repeated every 5-10 years through 61 years of age. However, you may need to be screened more often if early forms of precancerous polyps or small growths are found.  Skin Cancer  Check your skin from head to toe regularly.  Tell your health care provider about any new moles or changes in moles, especially if there is a change in a mole's shape or color.  Also tell your health care provider if you have a mole that is larger than the size of a pencil eraser.  Always use sunscreen. Apply sunscreen liberally and repeatedly throughout the day.  Protect yourself by wearing long sleeves, pants, a wide-brimmed hat, and sunglasses whenever you are outside.  Heart disease, diabetes, and high blood pressure  High blood pressure causes heart disease and increases the risk of stroke. High blood pressure is more likely to develop in: ? People who have blood pressure in the high end of the normal range (130-139/85-89 mm Hg). ? People who are overweight or obese. ? People who are African American.  If you are 63-60 years of age, have your blood pressure checked every 3-5 years. If you are 24 years of age or older, have your blood pressure checked every year. You should have your blood pressure measured twice-once when you are at a hospital or clinic, and once when you are not at a hospital or clinic. Record the average of the  two measurements. To check your blood pressure when you are not at a hospital or clinic, you can use: ? An automated blood pressure machine at a pharmacy. ? A home blood pressure monitor.  If you are between 72 years and 57 years old, ask your health care provider if you should take aspirin to prevent strokes.  Have regular diabetes screenings. This involves taking a blood sample to check your fasting blood sugar level. ? If you are at a normal weight and have a low risk for diabetes, have this test once every three years after 61 years of age. ? If you are overweight and have a high risk for diabetes, consider being tested at a younger age or more often. Preventing infection Hepatitis B  If you have a higher risk for hepatitis B, you should be screened for this virus. You are considered at high risk for hepatitis B if: ? You were born in a country where hepatitis B is common. Ask your health care provider which countries are considered high risk. ? Your parents were born in a high-risk country, and you have not been immunized against hepatitis B (hepatitis B vaccine). ? You have HIV or AIDS. ? You use needles to inject street drugs. ? You live with someone who has hepatitis B. ? You have had sex with someone who has hepatitis B. ? You get hemodialysis treatment. ? You take certain medicines for conditions, including cancer, organ transplantation, and autoimmune conditions.  Hepatitis C  Blood testing is recommended for: ? Everyone born from [birth year] through 1965. ? Anyone with known risk factors for hepatitis C.  Sexually transmitted infections (STIs)  You should be screened for sexually transmitted infections (STIs) including gonorrhea and chlamydia if: ? You are sexually active and are younger than 61 years of age. ? You are older than 61 years of age and your health care provider tells you that you are at risk for this type of infection. ? Your sexual activity has changed since you  were last screened and you are at an increased risk for chlamydia or gonorrhea. Ask your health care provider if you are at risk.  If you do not have HIV, but are at risk, it may be recommended that you  take a prescription medicine daily to prevent HIV infection. This is called pre-exposure prophylaxis (PrEP). You are considered at risk if: ? You are sexually active and do not regularly use condoms or know the HIV status of your partner(s). ? You take drugs by injection. ? You are sexually active with a partner who has HIV.  Talk with your health care provider about whether you are at high risk of being infected with HIV. If you choose to begin PrEP, you should first be tested for HIV. You should then be tested every 3 months for as long as you are taking PrEP. Pregnancy  If you are premenopausal and you may become pregnant, ask your health care provider about preconception counseling.  If you may become pregnant, take 400 to 800 micrograms (mcg) of folic acid every day.  If you want to prevent pregnancy, talk to your health care provider about birth control (contraception). Osteoporosis and menopause  Osteoporosis is a disease in which the bones lose minerals and strength with aging. This can result in serious bone fractures. Your risk for osteoporosis can be identified using a bone density scan.  If you are 7 years of age or older, or if you are at risk for osteoporosis and fractures, ask your health care provider if you should be screened.  Ask your health care provider whether you should take a calcium or vitamin D supplement to lower your risk for osteoporosis.  Menopause may have certain physical symptoms and risks.  Hormone replacement therapy may reduce some of these symptoms and risks. Talk to your health care provider about whether hormone replacement therapy is right for you. Follow these instructions at home:  Schedule regular health, dental, and eye exams.  Stay current  with your immunizations.  Do not use any tobacco products including cigarettes, chewing tobacco, or electronic cigarettes.  If you are pregnant, do not drink alcohol.  If you are breastfeeding, limit how much and how often you drink alcohol.  Limit alcohol intake to no more than 1 drink per day for nonpregnant women. One drink equals 12 ounces of beer, 5 ounces of wine, or 1 ounces of hard liquor.  Do not use street drugs.  Do not share needles.  Ask your health care provider for help if you need support or information about quitting drugs.  Tell your health care provider if you often feel depressed.  Tell your health care provider if you have ever been abused or do not feel safe at home. This information is not intended to replace advice given to you by your health care provider. Make sure you discuss any questions you have with your health care provider. Document Released: 12/18/2010 Document Revised: 11/10/2015 Document Reviewed: 03/08/2015 Elsevier Interactive Patient Education  2018 [place] you for enrolling in [place]. Please follow the instructions below to securely access your online medical record. MyChart allows you to send messages to your doctor, view your test results, manage appointments, and more.   How Do I Sign Up? 1. In your Internet browser, go to [employer] and enter [email or web address]. 2. Click on the Sign Up Now link in the Sign In box. You will see the New Member Sign Up page. 3. Enter your MyChart Access Code exactly as it appears below. You will not need to use this code after you've completed the sign-up process. If you do not sign up before the expiration date, you must request a new code.  MyChart Access Code: [email or web address]  Expires: 01/15/2017 11:11 AM  4. Enter your Social Security Number ([email or web address]) and Date of Birth (mm/dd/yyyy) as indicated and click Submit. You will be taken to the next sign-up page. 5. Create a  MyChart ID. This will be your MyChart login ID and cannot be changed, so think of one that is secure and easy to remember. 6. Create a MyChart password. You can change your password at any time. 7. Enter your Password Reset Question and Answer. This can be used at a later time if you forget your password.  8. Enter your e-mail address. You will receive e-mail notification when new information is available in [place]. 9. Click Sign Up. You can now view your medical record.   Additional Information Remember, MyChart is NOT to be used for urgent needs. For medical emergencies, dial 911.

## 2016-11-22 DIAGNOSIS — F3132 Bipolar disorder, current episode depressed, moderate: Secondary | ICD-10-CM | POA: Diagnosis not present

## 2016-12-03 NOTE — Progress Notes (Signed)
HPI:  Here for CPE: Saw Brittany [name] for AWV Appears is due for hep c screening, colon cancer screening, tetanus booster. Had labs 07/2016 - vit D, lipid, CBC, CMP ok. -Concerns and/or follow up today:   Several new concerns:  Hand rash: -itchy patch of dry skin R dorsal hand -chronic for many years -intermittent -never treated -in soil and water a lot with hands -washes hands a lot  Skin lesion legs: -few spots on R leg -for several months -no pain, occ pruritis  Mole on back: -R low back -her whole life, unchanges -wants [place] to look at it  Smells smoke: -chronic -will sometimes smell smoke and look around and not see anyone smoking -can occur anywhere -wants to see ENT about this  L shoulder pain: -with abd -chronic -notices only with certain activities -no weakness, numbness, radiation  Obesity, hyperlipidemia, osteoarthritis: -minimal exercise -diet ok per her report -psych meds have increased her weight -she is interested in healthy lifestyle  Osteopenia: -early menopause in early 6s -dexa done 08/2016 with osteopenia and below treatment thresholods -taking vit D  Asthma/GERD: -seeing pulmonologist, Dr. Melvyn Novas  Bipolar disorder/anxiety/panic disorder: -[facility] -meds: olanzapine, buspirone, alprazolam -on disability for psych issues  Inverted L nipple: -reports new for 1-2 years -no pain, mass, discharge, skin lesion  -Vaccines: UTD and/or refused  -pap history: normal 2 years ago, always normal, declines this and pelvic today after discussion pap guidelines  -sexual activity: yes, female partner, no new partners  -wants STI testing (Hep C if born [birth year]): no, reports hep c screening done  -FH breast, colon or ovarian ca: see FH Last mammogram: does yearly at [facility] Last colon cancer screening: normal colonoscopy at 50 per her report; discussed screening options  - she wants to do cologard  -Alcohol, Tobacco, drug use: see social history  Review of Systems - no fevers, unintentional weight loss, vision loss, hearing loss, chest pain, sob, hemoptysis, melena, hematochezia, hematuria, genital discharge, changing or concerning skin lesions, bleeding, bruising, loc, thoughts of self harm or SI  Past Medical History:  Diagnosis Date  . Abnormal EKG   . Asthma   . Bipolar depression ([place]), Anxiety, PTSD, Panic disorder    -managed by [facility]  . Foot pain   . Insomnia   . Leg swelling   . Osteoporosis   . Tachycardia     Past Surgical History:  Procedure Laterality Date  . ABLATION    . CARPAL TUNNEL RELEASE    . CESAREAN SECTION    . CHOLECYSTECTOMY    . KNEE SURGERY    . TUBAL LIGATION    . ULNAR NERVE REPAIR      Family History  Problem Relation Age of Onset  . Heart failure Mother   . Hypertension Mother   . Hyperlipidemia Mother   . Breast cancer Neg Hx     Social History   Social History  . Marital status: Single    Spouse name: N/A  . Number of children: N/A  . Years of education: N/A   Social History Main Topics  . Smoking status: Former Smoker    Packs/day: 20.00    Years: 1.00    Quit date: 06/19/1999  . Smokeless tobacco: Never Used     Comment: 20 year estimate but probably less   . Alcohol use 0.0 oz/week     Comment: Occass.   . Drug use: No  .  Sexual activity: No   Other Topics Concern  . None   Social History Narrative   Work or School: Disabled 2ndary to psychiatric conditions      Home Situation: lives alone with 2 cats and one dog      Spiritual Beliefs: Christian      Lifestyle: no regular exercise, diet  Not great - wants to embark on healthier lifestyle        Current Outpatient Prescriptions:  .  albuterol (PROVENTIL) (2.5 MG/3ML) 0.083% nebulizer solution, Take 2.5 mg by nebulization every 6 (six) hours as needed for wheezing or shortness of breath., Disp: , Rfl:  .   Albuterol Sulfate (PROAIR RESPICLICK) 108 (90 Base) MCG/ACT AEPB, Inhale 2 puffs into the lungs every 6 (six) hours as needed., Disp: 1 each, Rfl: 0 .  ALPRAZolam (XANAX) 0.5 MG tablet, Take 0.25-0.5 mg by mouth daily as needed for anxiety., Disp: , Rfl:  .  busPIRone (BUSPAR) 15 MG tablet, Take 15 mg by mouth 3 (three) times daily., Disp: , Rfl:  .  CALCIUM PO, Take 1 tablet by mouth daily., Disp: , Rfl:  .  chlorpheniramine (CHLOR-TRIMETON) 4 MG tablet, Take 4 mg by mouth every 4 (four) hours as needed for allergies., Disp: , Rfl:  .  Cholecalciferol (VITAMIN D-3 PO), Take 1 tablet by mouth daily., Disp: , Rfl:  .  dextromethorphan-guaiFENesin (MUCINEX DM) 30-600 MG 12hr tablet, Take 1 tablet by mouth 2 (two) times daily as needed for cough., Disp: , Rfl:  .  famotidine (PEPCID) 20 MG tablet, TAKE 1 TABLET BY MOUTH ONCE DAILY AT BEDTIME, Disp: 30 tablet, Rfl: 0 .  Ibuprofen (IBU-200 PO), Take 1-3 tablets by mouth daily as needed (for pain)., Disp: , Rfl:  .  montelukast (SINGULAIR) 10 MG tablet, Take 1 tablet (10 mg total) by mouth at bedtime., Disp: 30 tablet, Rfl: 11 .  Multiple Vitamins-Minerals (MULTIVITAMIN WITH MINERALS) tablet, Take 1 tablet by mouth daily., Disp: , Rfl:  .  omeprazole (PRILOSEC) 20 MG capsule, Take 30-60 min before first meal of the day, Disp: , Rfl:  .  triamcinolone cream (KENALOG) 0.1 %, Apply 1 application topically 2 (two) times daily. For itchy patch on R hand, Disp: 30 g, Rfl: 0  EXAM:  Vitals:   12/04/16 0739  BP: 118/80  Pulse: 96  Temp: 98 F (36.7 C)   Body mass index is 31.93 kg/m.  GENERAL: vitals reviewed and listed below, alert, oriented, appears well hydrated and in no acute distress  HEENT: head atraumatic, PERRLA, normal appearance of eyes, ears, nose and mouth. moist mucus membranes.  NECK: supple, no masses or lymphadenopathy  LUNGS: clear to auscultation bilaterally, no rales, rhonchi or wheeze  CV: HRRR, no peripheral edema or  cyanosis, normal pedal pulses  ABDOMEN: bowel sounds normal, soft, non tender to palpation, no masses, no rebound or guarding  BREAST: normal appearance except for L inverted nipple - no skin lesions or discharge noted on inspection of both breasts, on palpation of both breast and axillary region no suspicious lesions appreciated today  GU/RECTAL: declined  SKIN: 3 small oval patches of sl hyperpigmented and finely scaly skin on R shin, dark brown 6x66mm macule R lower back with several hairs, patch of thickened dry skin on R dorsal hand/wrist  MS: normal gait, moves all extremities normally, TTP in L lat shoulder with pain with abd L shoulder above 90 degrees  NEURO: normal gait, speech and thought processing grossly intact, muscle tone grossly  intact throughout  PSYCH: normal affect, pleasant and cooperative  ASSESSMENT AND PLAN:  Discussed the following assessment and plan:  Physical exam, annual -Discussed and advised all [place] preventive services health task force level A and B recommendations for age, sex and risks. -inverted nipple on breast exam - when questioned this is new - see below -discussed options for colon cancer screening and she prefers to do cologard - assistant advised to order and see pt instructions below -Advised at least 150 minutes of exercise per week and a healthy diet -tetanus booster today -labs, studies and vaccines per orders this encounter  Inverted nipple: Advises evaluation at the breast center,  assistant advise to order diagnostic mammo and [place] L breast  And schedule, she advised me later set up for this week  Hand eczema -triam cr, rx sent -advise avoidance soil and water overexposures, non latex gloves  Skin rash - lesions on leg have appearance of tinea - has several pets -advise topical lotrimin bid x 2-4 weeks with f/u if persists  Left shoulder pain, unspecified chronicity - query RTC tendinopathy -HEP provided, advised follow up in 4  weeks if not resolving  Hyperlipidemia, unspecified hyperlipidemia type -stable, lifestyle recs  Bipolar affective disorder, current episode depressed, current episode severity unspecified ([place]) -sees psychiatry for management  Osteopenia, unspecified location -opted for weight bearing exercise and vit D with repeat dexa in 2 years advised  Cough variant asthma  vs UACS  -sees pulm for management  Gastroesophageal reflux disease, esophagitis presence not specified -stable Need for Tdap vaccination - Plan: Tdap vaccine greater than or equal to 7yo IM   Orders Placed This Encounter  Procedures  . MM DIAG BREAST TOMO UNI LEFT    Ins: hta  Pf: 10/31/2016 bcg No needs/ no implants/Inverted nipple/ no hx breast cancer/ current with pcp  tomo    Per pt kdc     Standing Status:   Future    Standing Expiration Date:   02/03/2018    Order Specific Question:   Reason for Exam (SYMPTOM  OR DIAGNOSIS REQUIRED)    Answer:   Inverted nipple    Order Specific Question:   Preferred imaging location?    Answer:   [facility]  . Tdap vaccine greater than or equal to 7yo IM    Patient advised to return to clinic immediately if symptoms worsen or persist or new concerns.  Patient Instructions  BEFORE YOU LEAVE: -follow up: 6 months -order cologard -tetanus booster  We ordered the Cologuard test for colon cancer screening. Please complete this test promptly once the kit arrives. Please contact us if you have not received your kit in the next few weeks.  Start exercising! Goal of at least 150 minutes per week or 30 minutes per day of sweaty exercise!  Stop the prilosec if able. Use as needed.   Stop the calcium and multivitamin.  Continue vitamin D3 and eat a healthy diet and get regular exercise.  Advise regular aerobic exercise (at least 150 minutes per week of sweaty exercise) and a healthy diet. Try to eat at least 5-9 servings of vegetables and fruits per day (not corn,  potatoes or bananas.) Avoid sweets, red meat, pork, butter, fried foods, fast food, processed food, excessive dairy, eggs and coconut. Replace bad fats with good fats - fish, nuts and seeds, canola oil, olive oil.    Health Maintenance for Postmenopausal Women Menopause is a normal process in which your reproductive ability comes to  an end. This process happens gradually over a span of months to years, usually between the ages of 44 and 53. Menopause is complete when you have missed 12 consecutive menstrual periods. It is important to talk with your health care provider about some of the most common conditions that affect postmenopausal women, such as heart disease, cancer, and bone loss (osteoporosis). Adopting a healthy lifestyle and getting preventive care can help to promote your health and wellness. Those actions can also lower your chances of developing some of these common conditions. What should I know about menopause? During menopause, you may experience a number of symptoms, such as:  Moderate-to-severe hot flashes.  Night sweats.  Decrease in sex drive.  Mood swings.  Headaches.  Tiredness.  Irritability.  Memory problems.  Insomnia.  Choosing to treat or not to treat menopausal changes is an individual decision that you make with your health care provider. What should I know about hormone replacement therapy and supplements? Hormone therapy products are effective for treating symptoms that are associated with menopause, such as hot flashes and night sweats. Hormone replacement carries certain risks, especially as you become older. If you are thinking about using estrogen or estrogen with progestin treatments, discuss the benefits and risks with your health care provider. What should I know about heart disease and stroke? Heart disease, heart attack, and stroke become more likely as you age. This may be due, in part, to the hormonal changes that your body experiences during  menopause. These can affect how your body processes dietary fats, triglycerides, and cholesterol. Heart attack and stroke are both medical emergencies. There are many things that you can do to help prevent heart disease and stroke:  Have your blood pressure checked at least every 1-2 years. High blood pressure causes heart disease and increases the risk of stroke.  If you are 94-57 years old, ask your health care provider if you should take aspirin to prevent a heart attack or a stroke.  Do not use any tobacco products, including cigarettes, chewing tobacco, or electronic cigarettes. If you need help quitting, ask your health care provider.  It is important to eat a healthy diet and maintain a healthy weight. ? Be sure to include plenty of vegetables, fruits, low-fat dairy products, and lean protein. ? Avoid eating foods that are high in solid fats, added sugars, or salt (sodium).  Get regular exercise. This is one of the most important things that you can do for your health. ? Try to exercise for at least 150 minutes each week. The type of exercise that you do should increase your heart rate and make you sweat. This is known as moderate-intensity exercise. ? Try to do strengthening exercises at least twice each week. Do these in addition to the moderate-intensity exercise.  Know your numbers.Ask your health care provider to check your cholesterol and your blood glucose. Continue to have your blood tested as directed by your health care provider.  What should I know about cancer screening? There are several types of cancer. Take the following steps to reduce your risk and to catch any cancer development as early as possible. Breast Cancer  Practice breast self-awareness. ? This means understanding how your breasts normally appear and feel. ? It also means doing regular breast self-exams. Let your health care provider know about any changes, no matter how small.  If you are 64 or older,  have a clinician do a breast exam (clinical breast exam or CBE) every year.  Depending on your age, family history, and medical history, it may be recommended that you also have a yearly breast X-ray (mammogram).  If you have a family history of breast cancer, talk with your health care provider about genetic screening.  If you are at high risk for breast cancer, talk with your health care provider about having an MRI and a mammogram every year.  Breast cancer (BRCA) gene test is recommended for women who have family members with BRCA-related cancers. Results of the assessment will determine the need for genetic counseling and BRCA1 and for BRCA2 testing. BRCA-related cancers include these types: ? Breast. This occurs in males or females. ? Ovarian. ? Tubal. This may also be called fallopian tube cancer. ? Cancer of the abdominal or pelvic lining (peritoneal cancer). ? Prostate. ? Pancreatic.  Cervical, Uterine, and Ovarian Cancer Your health care provider may recommend that you be screened regularly for cancer of the pelvic organs. These include your ovaries, uterus, and vagina. This screening involves a pelvic exam, which includes checking for microscopic changes to the surface of your cervix (Pap test).  For women ages 21-65, health care providers may recommend a pelvic exam and a Pap test every three years. For women ages 20-65, they may recommend the Pap test and pelvic exam, combined with testing for human papilloma virus (HPV), every five years. Some types of HPV increase your risk of cervical cancer. Testing for HPV may also be done on women of any age who have unclear Pap test results.  Other health care providers may not recommend any screening for nonpregnant women who are considered low risk for pelvic cancer and have no symptoms. Ask your health care provider if a screening pelvic exam is right for you.  If you have had past treatment for cervical cancer or a condition that could  lead to cancer, you need Pap tests and screening for cancer for at least 20 years after your treatment. If Pap tests have been discontinued for you, your risk factors (such as having a new sexual partner) need to be reassessed to determine if you should start having screenings again. Some women have medical problems that increase the chance of getting cervical cancer. In these cases, your health care provider may recommend that you have screening and Pap tests more often.  If you have a family history of uterine cancer or ovarian cancer, talk with your health care provider about genetic screening.  If you have vaginal bleeding after reaching menopause, tell your health care provider.  There are currently no reliable tests available to screen for ovarian cancer.  Lung Cancer Lung cancer screening is recommended for adults 68-68 years old who are at high risk for lung cancer because of a history of smoking. A yearly low-dose CT scan of the lungs is recommended if you:  Currently smoke.  Have a history of at least 30 pack-years of smoking and you currently smoke or have quit within the past 15 years. A pack-year is smoking an average of one pack of cigarettes per day for one year.  Yearly screening should:  Continue until it has been 15 years since you quit.  Stop if you develop a health problem that would prevent you from having lung cancer treatment.  Colorectal Cancer  This type of cancer can be detected and can often be prevented.  Routine colorectal cancer screening usually begins at age 24 and continues through age 31.  If you have risk factors for colon cancer, your  health care provider may recommend that you be screened at an earlier age.  If you have a family history of colorectal cancer, talk with your health care provider about genetic screening.  Your health care provider may also recommend using home test kits to check for hidden blood in your stool.  A small camera at the  end of a tube can be used to examine your colon directly (sigmoidoscopy or colonoscopy). This is done to check for the earliest forms of colorectal cancer.  Direct examination of the colon should be repeated every 5-10 years until age 55. However, if early forms of precancerous polyps or small growths are found or if you have a family history or genetic risk for colorectal cancer, you may need to be screened more often.  Skin Cancer  Check your skin from head to toe regularly.  Monitor any moles. Be sure to tell your health care provider: ? About any new moles or changes in moles, especially if there is a change in a mole's shape or color. ? If you have a mole that is larger than the size of a pencil eraser.  If any of your family members has a history of skin cancer, especially at a young age, talk with your health care provider about genetic screening.  Always use sunscreen. Apply sunscreen liberally and repeatedly throughout the day.  Whenever you are outside, protect yourself by wearing long sleeves, pants, a wide-brimmed hat, and sunglasses.  What should I know about osteoporosis? Osteoporosis is a condition in which bone destruction happens more quickly than new bone creation. After menopause, you may be at an increased risk for osteoporosis. To help prevent osteoporosis or the bone fractures that can happen because of osteoporosis, the following is recommended:  If you are 8-71 years old, get at least 1,000 mg of calcium and at least 600 mg of vitamin D per day.  If you are older than age 65 but younger than age 36, get at least 1,200 mg of calcium and at least 600 mg of vitamin D per day.  If you are older than age 19, get at least 1,200 mg of calcium and at least 800 mg of vitamin D per day.  Smoking and excessive alcohol intake increase the risk of osteoporosis. Eat foods that are rich in calcium and vitamin D, and do weight-bearing exercises several times each week as directed  by your health care provider. What should I know about how menopause affects my mental health? Depression may occur at any age, but it is more common as you become older. Common symptoms of depression include:  Low or sad mood.  Changes in sleep patterns.  Changes in appetite or eating patterns.  Feeling an overall lack of motivation or enjoyment of activities that you previously enjoyed.  Frequent crying spells.  Talk with your health care provider if you think that you are experiencing depression. What should I know about immunizations? It is important that you get and maintain your immunizations. These include:  Tetanus, diphtheria, and pertussis (Tdap) booster vaccine.  Influenza every year before the flu season begins.  Pneumonia vaccine.  Shingles vaccine.  Your health care provider may also recommend other immunizations. This information is not intended to replace advice given to you by your health care provider. Make sure you discuss any questions you have with your health care provider. Document Released: 07/27/2005 Document Revised: 12/23/2015 Document Reviewed: 03/08/2015 Elsevier Interactive Patient Education  2018 [employer].  No Follow-up on file.  Colin Benton R., DO

## 2016-12-04 ENCOUNTER — Ambulatory Visit (INDEPENDENT_AMBULATORY_CARE_PROVIDER_SITE_OTHER): Payer: PPO | Admitting: Family Medicine

## 2016-12-04 ENCOUNTER — Other Ambulatory Visit: Payer: Self-pay | Admitting: Family Medicine

## 2016-12-04 ENCOUNTER — Encounter: Payer: Self-pay | Admitting: Family Medicine

## 2016-12-04 VITALS — BP 118/80 | HR 96 | Temp 98.0°F | Ht 61.0 in | Wt 169.0 lb

## 2016-12-04 DIAGNOSIS — K219 Gastro-esophageal reflux disease without esophagitis: Secondary | ICD-10-CM | POA: Diagnosis not present

## 2016-12-04 DIAGNOSIS — F3130 Bipolar disorder, current episode depressed, mild or moderate severity, unspecified: Secondary | ICD-10-CM | POA: Diagnosis not present

## 2016-12-04 DIAGNOSIS — R21 Rash and other nonspecific skin eruption: Secondary | ICD-10-CM

## 2016-12-04 DIAGNOSIS — M25512 Pain in left shoulder: Secondary | ICD-10-CM | POA: Diagnosis not present

## 2016-12-04 DIAGNOSIS — N6459 Other signs and symptoms in breast: Secondary | ICD-10-CM

## 2016-12-04 DIAGNOSIS — J45991 Cough variant asthma: Secondary | ICD-10-CM | POA: Diagnosis not present

## 2016-12-04 DIAGNOSIS — Z0000 Encounter for general adult medical examination without abnormal findings: Secondary | ICD-10-CM | POA: Diagnosis not present

## 2016-12-04 DIAGNOSIS — M8580 Other specified disorders of bone density and structure, unspecified site: Secondary | ICD-10-CM

## 2016-12-04 DIAGNOSIS — L309 Dermatitis, unspecified: Secondary | ICD-10-CM | POA: Diagnosis not present

## 2016-12-04 DIAGNOSIS — Z23 Encounter for immunization: Secondary | ICD-10-CM | POA: Diagnosis not present

## 2016-12-04 DIAGNOSIS — E785 Hyperlipidemia, unspecified: Secondary | ICD-10-CM | POA: Diagnosis not present

## 2016-12-04 MED ORDER — TRIAMCINOLONE ACETONIDE 0.1 % EX CREA: 1.0000 "application " | TOPICAL_CREAM | Freq: Two times a day (BID) | CUTANEOUS | 0 refills | Status: DC

## 2016-12-04 NOTE — Patient Instructions (Signed)
BEFORE YOU LEAVE: -follow up: 6 months -order cologard -tetanus booster  We ordered the Cologuard test for colon cancer screening. Please complete this test promptly once the kit arrives. Please contact us if you have not received your kit in the next few weeks.  Start exercising! Goal of at least 150 minutes per week or 30 minutes per day of sweaty exercise!  Stop the prilosec if able. Use as needed.   Stop the calcium and multivitamin.  Continue vitamin D3 and eat a healthy diet and get regular exercise.  Advise regular aerobic exercise (at least 150 minutes per week of sweaty exercise) and a healthy diet. Try to eat at least 5-9 servings of vegetables and fruits per day (not corn, potatoes or bananas.) Avoid sweets, red meat, pork, butter, fried foods, fast food, processed food, excessive dairy, eggs and coconut. Replace bad fats with good fats - fish, nuts and seeds, canola oil, olive oil.    Health Maintenance for Postmenopausal Women Menopause is a normal process in which your reproductive ability comes to an end. This process happens gradually over a span of months to years, usually between the ages of 22 and 59. Menopause is complete when you have missed 12 consecutive menstrual periods. It is important to talk with your health care provider about some of the most common conditions that affect postmenopausal women, such as heart disease, cancer, and bone loss (osteoporosis). Adopting a healthy lifestyle and getting preventive care can help to promote your health and wellness. Those actions can also lower your chances of developing some of these common conditions. What should I know about menopause? During menopause, you may experience a number of symptoms, such as:  Moderate-to-severe hot flashes.  Night sweats.  Decrease in sex drive.  Mood swings.  Headaches.  Tiredness.  Irritability.  Memory problems.  Insomnia.  Choosing to treat or not to treat menopausal  changes is an individual decision that you make with your health care provider. What should I know about hormone replacement therapy and supplements? Hormone therapy products are effective for treating symptoms that are associated with menopause, such as hot flashes and night sweats. Hormone replacement carries certain risks, especially as you become older. If you are thinking about using estrogen or estrogen with progestin treatments, discuss the benefits and risks with your health care provider. What should I know about heart disease and stroke? Heart disease, heart attack, and stroke become more likely as you age. This may be due, in part, to the hormonal changes that your body experiences during menopause. These can affect how your body processes dietary fats, triglycerides, and cholesterol. Heart attack and stroke are both medical emergencies. There are many things that you can do to help prevent heart disease and stroke:  Have your blood pressure checked at least every 1-2 years. High blood pressure causes heart disease and increases the risk of stroke.  If you are 25-87 years old, ask your health care provider if you should take aspirin to prevent a heart attack or a stroke.  Do not use any tobacco products, including cigarettes, chewing tobacco, or electronic cigarettes. If you need help quitting, ask your health care provider.  It is important to eat a healthy diet and maintain a healthy weight. ? Be sure to include plenty of vegetables, fruits, low-fat dairy products, and lean protein. ? Avoid eating foods that are high in solid fats, added sugars, or salt (sodium).  Get regular exercise. This is one of the most important  things that you can do for your health. ? Try to exercise for at least 150 minutes each week. The type of exercise that you do should increase your heart rate and make you sweat. This is known as moderate-intensity exercise. ? Try to do strengthening exercises at least  twice each week. Do these in addition to the moderate-intensity exercise.  Know your numbers.Ask your health care provider to check your cholesterol and your blood glucose. Continue to have your blood tested as directed by your health care provider.  What should I know about cancer screening? There are several types of cancer. Take the following steps to reduce your risk and to catch any cancer development as early as possible. Breast Cancer  Practice breast self-awareness. ? This means understanding how your breasts normally appear and feel. ? It also means doing regular breast self-exams. Let your health care provider know about any changes, no matter how small.  If you are 78 or older, have a clinician do a breast exam (clinical breast exam or CBE) every year. Depending on your age, family history, and medical history, it may be recommended that you also have a yearly breast X-ray (mammogram).  If you have a family history of breast cancer, talk with your health care provider about genetic screening.  If you are at high risk for breast cancer, talk with your health care provider about having an MRI and a mammogram every year.  Breast cancer (BRCA) gene test is recommended for women who have family members with BRCA-related cancers. Results of the assessment will determine the need for genetic counseling and BRCA1 and for BRCA2 testing. BRCA-related cancers include these types: ? Breast. This occurs in males or females. ? Ovarian. ? Tubal. This may also be called fallopian tube cancer. ? Cancer of the abdominal or pelvic lining (peritoneal cancer). ? Prostate. ? Pancreatic.  Cervical, Uterine, and Ovarian Cancer Your health care provider may recommend that you be screened regularly for cancer of the pelvic organs. These include your ovaries, uterus, and vagina. This screening involves a pelvic exam, which includes checking for microscopic changes to the surface of your cervix (Pap  test).  For women ages 21-65, health care providers may recommend a pelvic exam and a Pap test every three years. For women ages 22-65, they may recommend the Pap test and pelvic exam, combined with testing for human papilloma virus (HPV), every five years. Some types of HPV increase your risk of cervical cancer. Testing for HPV may also be done on women of any age who have unclear Pap test results.  Other health care providers may not recommend any screening for nonpregnant women who are considered low risk for pelvic cancer and have no symptoms. Ask your health care provider if a screening pelvic exam is right for you.  If you have had past treatment for cervical cancer or a condition that could lead to cancer, you need Pap tests and screening for cancer for at least 20 years after your treatment. If Pap tests have been discontinued for you, your risk factors (such as having a new sexual partner) need to be reassessed to determine if you should start having screenings again. Some women have medical problems that increase the chance of getting cervical cancer. In these cases, your health care provider may recommend that you have screening and Pap tests more often.  If you have a family history of uterine cancer or ovarian cancer, talk with your health care provider about genetic screening.  If you have vaginal bleeding after reaching menopause, tell your health care provider.  There are currently no reliable tests available to screen for ovarian cancer.  Lung Cancer Lung cancer screening is recommended for adults 39-88 years old who are at high risk for lung cancer because of a history of smoking. A yearly low-dose CT scan of the lungs is recommended if you:  Currently smoke.  Have a history of at least 30 pack-years of smoking and you currently smoke or have quit within the past 15 years. A pack-year is smoking an average of one pack of cigarettes per day for one year.  Yearly screening  should:  Continue until it has been 15 years since you quit.  Stop if you develop a health problem that would prevent you from having lung cancer treatment.  Colorectal Cancer  This type of cancer can be detected and can often be prevented.  Routine colorectal cancer screening usually begins at age 66 and continues through age 71.  If you have risk factors for colon cancer, your health care provider may recommend that you be screened at an earlier age.  If you have a family history of colorectal cancer, talk with your health care provider about genetic screening.  Your health care provider may also recommend using home test kits to check for hidden blood in your stool.  A small camera at the end of a tube can be used to examine your colon directly (sigmoidoscopy or colonoscopy). This is done to check for the earliest forms of colorectal cancer.  Direct examination of the colon should be repeated every 5-10 years until age 62. However, if early forms of precancerous polyps or small growths are found or if you have a family history or genetic risk for colorectal cancer, you may need to be screened more often.  Skin Cancer  Check your skin from head to toe regularly.  Monitor any moles. Be sure to tell your health care provider: ? About any new moles or changes in moles, especially if there is a change in a mole's shape or color. ? If you have a mole that is larger than the size of a pencil eraser.  If any of your family members has a history of skin cancer, especially at a young age, talk with your health care provider about genetic screening.  Always use sunscreen. Apply sunscreen liberally and repeatedly throughout the day.  Whenever you are outside, protect yourself by wearing long sleeves, pants, a wide-brimmed hat, and sunglasses.  What should I know about osteoporosis? Osteoporosis is a condition in which bone destruction happens more quickly than new bone creation. After  menopause, you may be at an increased risk for osteoporosis. To help prevent osteoporosis or the bone fractures that can happen because of osteoporosis, the following is recommended:  If you are 91-73 years old, get at least 1,000 mg of calcium and at least 600 mg of vitamin D per day.  If you are older than age 64 but younger than age 98, get at least 1,200 mg of calcium and at least 600 mg of vitamin D per day.  If you are older than age 66, get at least 1,200 mg of calcium and at least 800 mg of vitamin D per day.  Smoking and excessive alcohol intake increase the risk of osteoporosis. Eat foods that are rich in calcium and vitamin D, and do weight-bearing exercises several times each week as directed by your health care provider. What should  I know about how menopause affects my mental health? Depression may occur at any age, but it is more common as you become older. Common symptoms of depression include:  Low or sad mood.  Changes in sleep patterns.  Changes in appetite or eating patterns.  Feeling an overall lack of motivation or enjoyment of activities that you previously enjoyed.  Frequent crying spells.  Talk with your health care provider if you think that you are experiencing depression. What should I know about immunizations? It is important that you get and maintain your immunizations. These include:  Tetanus, diphtheria, and pertussis (Tdap) booster vaccine.  Influenza every year before the flu season begins.  Pneumonia vaccine.  Shingles vaccine.  Your health care provider may also recommend other immunizations. This information is not intended to replace advice given to you by your health care provider. Make sure you discuss any questions you have with your health care provider. Document Released: 07/27/2005 Document Revised: 12/23/2015 Document Reviewed: 03/08/2015 Elsevier Interactive Patient Education  2018 [employer].

## 2016-12-06 ENCOUNTER — Ambulatory Visit
Admission: RE | Admit: 2016-12-06 | Discharge: 2016-12-06 | Disposition: A | Discharge status: Home / Self Care | Payer: PPO | Source: Ambulatory Visit | Attending: Family Medicine | Admitting: Family Medicine

## 2016-12-06 DIAGNOSIS — R928 Other abnormal and inconclusive findings on diagnostic imaging of breast: Secondary | ICD-10-CM | POA: Diagnosis not present

## 2016-12-06 DIAGNOSIS — N6459 Other signs and symptoms in breast: Secondary | ICD-10-CM

## 2016-12-06 DIAGNOSIS — N6489 Other specified disorders of breast: Secondary | ICD-10-CM | POA: Diagnosis not present

## 2016-12-13 ENCOUNTER — Other Ambulatory Visit: Payer: Self-pay | Admitting: Internal Medicine

## 2016-12-20 ENCOUNTER — Encounter: Payer: Self-pay | Admitting: Family Medicine

## 2017-02-28 ENCOUNTER — Ambulatory Visit (INDEPENDENT_AMBULATORY_CARE_PROVIDER_SITE_OTHER): Payer: PPO

## 2017-02-28 DIAGNOSIS — Z23 Encounter for immunization: Secondary | ICD-10-CM | POA: Diagnosis not present

## 2017-02-28 DIAGNOSIS — F3132 Bipolar disorder, current episode depressed, moderate: Secondary | ICD-10-CM | POA: Diagnosis not present

## 2017-03-07 ENCOUNTER — Encounter: Payer: Self-pay | Admitting: Family Medicine

## 2017-03-08 ENCOUNTER — Other Ambulatory Visit: Payer: Self-pay | Admitting: Internal Medicine

## 2017-03-20 DIAGNOSIS — J342 Deviated nasal septum: Secondary | ICD-10-CM | POA: Diagnosis not present

## 2017-03-20 DIAGNOSIS — R438 Other disturbances of smell and taste: Secondary | ICD-10-CM | POA: Diagnosis not present

## 2017-03-20 DIAGNOSIS — J343 Hypertrophy of nasal turbinates: Secondary | ICD-10-CM | POA: Diagnosis not present

## 2017-03-25 ENCOUNTER — Other Ambulatory Visit (INDEPENDENT_AMBULATORY_CARE_PROVIDER_SITE_OTHER): Payer: Self-pay | Admitting: Otolaryngology

## 2017-03-26 ENCOUNTER — Other Ambulatory Visit (INDEPENDENT_AMBULATORY_CARE_PROVIDER_SITE_OTHER): Payer: Self-pay | Admitting: Otolaryngology

## 2017-03-26 DIAGNOSIS — R430 Anosmia: Secondary | ICD-10-CM

## 2017-03-28 DIAGNOSIS — F3132 Bipolar disorder, current episode depressed, moderate: Secondary | ICD-10-CM | POA: Diagnosis not present

## 2017-04-09 ENCOUNTER — Ambulatory Visit
Admission: RE | Admit: 2017-04-09 | Discharge: 2017-04-09 | Disposition: A | Discharge status: Home / Self Care | Payer: PPO | Source: Ambulatory Visit | Attending: Otolaryngology | Admitting: Otolaryngology

## 2017-04-09 DIAGNOSIS — R430 Anosmia: Secondary | ICD-10-CM

## 2017-04-09 MED ORDER — GADOBENATE DIMEGLUMINE 529 MG/ML IV SOLN: 15.0000 mL | Freq: Once | INTRAVENOUS | Status: DC | PRN

## 2017-04-17 DIAGNOSIS — R438 Other disturbances of smell and taste: Secondary | ICD-10-CM | POA: Diagnosis not present

## 2017-04-17 DIAGNOSIS — J342 Deviated nasal septum: Secondary | ICD-10-CM | POA: Diagnosis not present

## 2017-04-17 DIAGNOSIS — R430 Anosmia: Secondary | ICD-10-CM | POA: Diagnosis not present

## 2017-04-17 DIAGNOSIS — J343 Hypertrophy of nasal turbinates: Secondary | ICD-10-CM | POA: Diagnosis not present

## 2017-04-25 DIAGNOSIS — F3132 Bipolar disorder, current episode depressed, moderate: Secondary | ICD-10-CM | POA: Diagnosis not present

## 2017-05-30 DIAGNOSIS — F3132 Bipolar disorder, current episode depressed, moderate: Secondary | ICD-10-CM | POA: Diagnosis not present

## 2017-06-04 NOTE — Progress Notes (Signed)
HPI:  Brittany [name] is a pleasant 61 y.o. here for follow up. Chronic medical problems summarized below were reviewed for changes and stability and were updated as needed below. These issues and their treatment remain stable for the most part.  Reports doing well.  Mood stable.  Reports she is exercising more and eating healthier.  Fasting labs today.  Reports she did the evaluation at the breast center with ultrasound and a special type of mammogram was told everything looked okay and to do her normal yearly mammograms.  We did a Cologuard test, but she has not heard about her results.  She says this was about a month or 2 ago.  She has a new concern of chronic postnasal drip, occasional sneezing, occasional cough.  She has bad allergies.  She has an allergy to cats and has several cats in her house.  No fevers, sinus pain, shortness of breath, wheezing.  He does take Singulair and antihistamine, no intranasal steroid.  Denies CP, SOB, DOE, treatment intolerance or new symptoms.  Obesity, hyperlipidemia, osteoarthritis: -reports 12/18 doing better with increased exercise and diet ok -psych meds have increased her weight -she is interested in healthy lifestyle  Osteopenia: -early menopause in early 17s -dexa done 08/2016 with osteopenia and below treatment thresholods -taking vit D  Asthma/GERD: -seeing pulmonologist, Dr. Melvyn Novas  Bipolar disorder/anxiety/panic disorder: -[facility] -on disability for psych issues  Inverted L nipple: -reports new for 1-2 years at 6/18 visit --> referred to breast center -she reports did eval, told normal and to do normal yearly mammograms -no pain, mass, discharge, skin lesion   ROS: See pertinent positives and negatives per HPI.  Past Medical History:  Diagnosis Date  . Abnormal EKG   . Asthma   . Bipolar depression ([place]), Anxiety, PTSD, Panic disorder    -managed by  [facility]  . Foot pain   . Insomnia   . Leg swelling   . Osteoporosis   . Tachycardia     Past Surgical History:  Procedure Laterality Date  . ABLATION    . CARPAL TUNNEL RELEASE    . CESAREAN SECTION    . CHOLECYSTECTOMY    . KNEE SURGERY    . TUBAL LIGATION    . ULNAR NERVE REPAIR      Family History  Problem Relation Age of Onset  . Heart failure Mother   . Hypertension Mother   . Hyperlipidemia Mother   . Breast cancer Neg Hx     Social History   Socioeconomic History  . Marital status: Single    Spouse name: None  . Number of children: None  . Years of education: None  . Highest education level: None  Social Needs  . Financial resource strain: None  . Food insecurity - worry: None  . Food insecurity - inability: None  . Transportation needs - medical: None  . Transportation needs - non-medical: None  Occupational History  . None  Tobacco Use  . Smoking status: Former Smoker    Packs/day: 20.00    Years: 1.00    Pack years: 20.00    Last attempt to quit: 06/19/1999    Years since quitting: 17.9  . Smokeless tobacco: Never Used  . Tobacco comment: 20 year estimate but probably less   Substance and Sexual Activity  . Alcohol use: Yes    Alcohol/week: 0.0 oz    Comment: Occass.   . Drug use:  No  . Sexual activity: No  Other Topics Concern  . None  Social History Narrative   Work or School: Disabled 2ndary to psychiatric conditions      Home Situation: lives alone with 2 cats and one dog      Spiritual Beliefs: Christian      Lifestyle: no regular exercise, diet  Not great - wants to embark on healthier lifestyle     Current Outpatient Medications:  .  albuterol (PROVENTIL) (2.5 MG/3ML) 0.083% nebulizer solution, Take 2.5 mg by nebulization every 6 (six) hours as needed for wheezing or shortness of breath., Disp: , Rfl:  .  Albuterol Sulfate (PROAIR RESPICLICK) 950 (90 Base) MCG/ACT AEPB, Inhale 2 puffs into the lungs every 6 (six)  hours as needed., Disp: 1 each, Rfl: 0 .  ALPRAZolam (XANAX) 0.5 MG tablet, Take 0.25-0.5 mg by mouth daily as needed for anxiety., Disp: , Rfl:  .  busPIRone (BUSPAR) 15 MG tablet, Take 15 mg by mouth 3 (three) times daily., Disp: , Rfl:  .  CALCIUM PO, Take 1 tablet by mouth daily., Disp: , Rfl:  .  chlorpheniramine (CHLOR-TRIMETON) 4 MG tablet, Take 4 mg by mouth every 4 (four) hours as needed for allergies., Disp: , Rfl:  .  Cholecalciferol (VITAMIN D-3 PO), Take 1 tablet by mouth daily., Disp: , Rfl:  .  dextromethorphan-guaiFENesin (MUCINEX DM) 30-600 MG 12hr tablet, Take 1 tablet by mouth 2 (two) times daily as needed for cough., Disp: , Rfl:  .  doxazosin (CARDURA) 4 MG tablet, Take 6 mg by mouth daily., Disp: , Rfl:  .  gabapentin (NEURONTIN) 300 MG capsule, Take 300 mg by mouth every other day., Disp: , Rfl:  .  Ibuprofen (IBU-200 PO), Take 1-3 tablets by mouth daily as needed (for pain)., Disp: , Rfl:  .  lithium 300 MG tablet, Take 600 mg by mouth daily., Disp: , Rfl:  .  montelukast (SINGULAIR) 10 MG tablet, Take 1 tablet (10 mg total) by mouth at bedtime., Disp: 30 tablet, Rfl: 11 .  Multiple Vitamins-Minerals (MULTIVITAMIN WITH MINERALS) tablet, Take 1 tablet by mouth daily., Disp: , Rfl:  .  omeprazole (PRILOSEC) 20 MG capsule, Take 30-60 min before first meal of the day, Disp: , Rfl:  .  Probiotic Product (PROBIOTIC PO), Take by mouth daily., Disp: , Rfl:  .  propranolol (INDERAL) 20 MG tablet, Take 20 mg by mouth. Take 1-2 tablets twice a day, Disp: , Rfl:  .  sertraline (ZOLOFT) 50 MG tablet, Take 50 mg by mouth at bedtime., Disp: , Rfl:  .  triamcinolone cream (KENALOG) 0.1 %, Apply 1 application topically 2 (two) times daily. For itchy patch on R hand, Disp: 30 g, Rfl: 0  EXAM:  Vitals:   06/06/17 0801  BP: 100/70  Pulse: 77  Temp: 97.9 F (36.6 C)    Body mass index is 31.33 kg/m.  GENERAL: vitals reviewed and listed above, alert, oriented, appears well hydrated  and in no acute distress  HEENT: atraumatic, conjunttiva clear, no obvious abnormalities on inspection of external nose and ears  NECK: no obvious masses on inspection  LUNGS: clear to auscultation bilaterally, no wheezes, rales or rhonchi, good air movement  CV: HRRR, no peripheral edema  MS: moves all extremities without noticeable abnormality  PSYCH: pleasant and cooperative, no obvious depression or anxiety  ASSESSMENT AND PLAN:  Discussed the following assessment and plan:  Colon cancer screening  BMI 31.0-31.9,adult - Plan: Hemoglobin A1c  Hyperlipidemia,  unspecified hyperlipidemia type - Plan: Lipid panel  Allergic rhinitis due to animal hair and dander  -Will have my assistant check on the results of the Cologuard test, asked patient to contact us in 1-2 weeks if she has not heard back from [place] about the results -Labs today, fasting -Lifestyle recommendations  -add intranasal steroid for the allergy symptoms/postnasal drip -follow-up if persists, she has this at home, but has not been using it -Patient advised to return or notify a doctor immediately if symptoms worsen or persist or new concerns arise.  Patient Instructions  BEFORE YOU LEAVE: -labs -obtain cologuard report and update HM -follow up: 3-4 months  We have ordered labs or studies at this visit. It can take up to 1-2 weeks for results and processing. IF results require follow up or explanation, we will call you with instructions. Clinically stable results will be released to your [facility]. If you have not heard from [place] or cannot find your results in [facility] in 2 weeks please contact our office at [phone].  If you are not yet signed up for [facility], please consider signing up.  Add Flonase to your allergy regimen.  2 sprays each nostril daily for 1 month, then 1 spray each nostril daily.  Follow-up if your symptoms do not improve.  I will have my assistant check on the Cologuard results.  Please call us  in the next 1-2 weeks if you have not heard back from [place] about this.    We recommend the following healthy lifestyle for LIFE: 1) Small portions. But, make sure to get regular (at least 3 per day), healthy meals and small healthy snacks if needed.  2) Eat a healthy clean diet.   TRY TO EAT: -at least 5-7 servings of low sugar, colorful, and nutrient rich vegetables per day (not corn, potatoes or bananas.) -berries are the best choice if you wish to eat fruit (only eat small amounts if trying to reduce weight)  -lean meets (fish, white meat of chicken or [place]) -vegan proteins for some meals - beans or tofu, whole grains, nuts and seeds -Replace bad fats with good fats - good fats include: fish, nuts and seeds, canola oil, olive oil -small amounts of low fat or non fat dairy -small amounts of100 % whole grains - check the lables -drink plenty of water  AVOID: -SUGAR, sweets, anything with added sugar, corn syrup or sweeteners - must read labels as even foods advertised as "healthy" often are loaded with sugar -if you must have a sweetener, small amounts of stevia may be best -sweetened beverages and artificially sweetened beverages -simple starches (rice, bread, potatoes, pasta, chips, etc - small amounts of 100% whole grains are ok) -red meat, pork, butter -fried foods, fast food, processed food, excessive dairy, eggs and coconut.  3)Get at least 150 minutes of sweaty aerobic exercise per week.  4)Reduce stress - consider counseling, meditation and relaxation to balance other aspects of your life.          Colin Benton R., DO

## 2017-06-06 ENCOUNTER — Ambulatory Visit: Payer: PPO | Admitting: Family Medicine

## 2017-06-06 ENCOUNTER — Encounter: Payer: Self-pay | Admitting: Family Medicine

## 2017-06-06 ENCOUNTER — Telehealth: Payer: Self-pay | Admitting: Family Medicine

## 2017-06-06 VITALS — BP 100/70 | HR 77 | Temp 97.9°F | Ht 61.0 in | Wt 165.8 lb

## 2017-06-06 DIAGNOSIS — E785 Hyperlipidemia, unspecified: Secondary | ICD-10-CM | POA: Diagnosis not present

## 2017-06-06 DIAGNOSIS — Z6831 Body mass index (BMI) 31.0-31.9, adult: Secondary | ICD-10-CM

## 2017-06-06 DIAGNOSIS — Z1211 Encounter for screening for malignant neoplasm of colon: Secondary | ICD-10-CM

## 2017-06-06 DIAGNOSIS — J3081 Allergic rhinitis due to animal (cat) (dog) hair and dander: Secondary | ICD-10-CM

## 2017-06-06 LAB — LIPID PANEL
Cholesterol: 206 mg/dL — ABNORMAL HIGH (ref 0–200)
HDL: 64.6 mg/dL (ref >39.00)
LDL CALC: 125 mg/dL — AB (ref 0–99)
NONHDL: 141.55
Total CHOL/HDL Ratio: 3
Triglycerides: 83 mg/dL (ref 0.0–149.0)
VLDL: 16.6 mg/dL (ref 0.0–40.0)

## 2017-06-06 LAB — HEMOGLOBIN A1C: HEMOGLOBIN A1C: 4.9 % (ref 4.6–6.5)

## 2017-06-06 NOTE — Patient Instructions (Addendum)
BEFORE YOU LEAVE: -labs -obtain cologuard report and update HM -follow up: 3-4 months  We have ordered labs or studies at this visit. It can take up to 1-2 weeks for results and processing. IF results require follow up or explanation, we will call you with instructions. Clinically stable results will be released to your [facility]. If you have not heard from [place] or cannot find your results in [facility] in 2 weeks please contact our office at [phone].  If you are not yet signed up for [facility], please consider signing up.  Add Flonase to your allergy regimen.  2 sprays each nostril daily for 1 month, then 1 spray each nostril daily.  Follow-up if your symptoms do not improve.  I will have my assistant check on the Cologuard results.  Please call us in the next 1-2 weeks if you have not heard back from [place] about this.    We recommend the following healthy lifestyle for LIFE: 1) Small portions. But, make sure to get regular (at least 3 per day), healthy meals and small healthy snacks if needed.  2) Eat a healthy clean diet.   TRY TO EAT: -at least 5-7 servings of low sugar, colorful, and nutrient rich vegetables per day (not corn, potatoes or bananas.) -berries are the best choice if you wish to eat fruit (only eat small amounts if trying to reduce weight)  -lean meets (fish, white meat of chicken or [place]) -vegan proteins for some meals - beans or tofu, whole grains, nuts and seeds -Replace bad fats with good fats - good fats include: fish, nuts and seeds, canola oil, olive oil -small amounts of low fat or non fat dairy -small amounts of100 % whole grains - check the lables -drink plenty of water  AVOID: -SUGAR, sweets, anything with added sugar, corn syrup or sweeteners - must read labels as even foods advertised as "healthy" often are loaded with sugar -if you must have a sweetener, small amounts of stevia may be best -sweetened beverages and artificially sweetened beverages -simple  starches (rice, bread, potatoes, pasta, chips, etc - small amounts of 100% whole grains are ok) -red meat, pork, butter -fried foods, fast food, processed food, excessive dairy, eggs and coconut.  3)Get at least 150 minutes of sweaty aerobic exercise per week.  4)Reduce stress - consider counseling, meditation and relaxation to balance other aspects of your life.

## 2017-06-06 NOTE — Telephone Encounter (Signed)
I called the pt and informed her of the message below.  Patient stated she will do the test again and I called [facility] and spoke with Izora Gala and she stated the test will be sent to the pt again and there are no charges at this point due to no result.  I also gave Izora Gala the pts phone number to contact her if there are any future problems and she agreed.

## 2017-06-06 NOTE — Telephone Encounter (Signed)
Copied from [place] [id number]. Topic: General - Other >> Jun 06, 2017  8:51 AM Lennox Solders wrote: Reason for CRM: Izora Gala at  [employer] is calling . Mechele Claude please reach out to patient to see if pt would like to retake cologuard test. Izora Gala will document the patient chart in their system. Pt was on do not call list and they did not reach out to patient. The cologuard test did not resulted

## 2017-06-13 ENCOUNTER — Ambulatory Visit: Payer: Self-pay | Admitting: Family Medicine

## 2017-06-27 ENCOUNTER — Encounter: Payer: Self-pay | Admitting: Family Medicine

## 2017-07-23 DIAGNOSIS — F3132 Bipolar disorder, current episode depressed, moderate: Secondary | ICD-10-CM | POA: Diagnosis not present

## 2017-08-15 DIAGNOSIS — F3132 Bipolar disorder, current episode depressed, moderate: Secondary | ICD-10-CM | POA: Diagnosis not present

## 2017-09-05 ENCOUNTER — Ambulatory Visit (INDEPENDENT_AMBULATORY_CARE_PROVIDER_SITE_OTHER): Payer: PPO | Admitting: Family Medicine

## 2017-09-05 ENCOUNTER — Encounter: Payer: Self-pay | Admitting: Family Medicine

## 2017-09-05 VITALS — BP 102/60 | HR 64 | Temp 98.1°F | Ht 61.0 in

## 2017-09-05 DIAGNOSIS — M9903 Segmental and somatic dysfunction of lumbar region: Secondary | ICD-10-CM | POA: Diagnosis not present

## 2017-09-05 DIAGNOSIS — M5441 Lumbago with sciatica, right side: Secondary | ICD-10-CM | POA: Diagnosis not present

## 2017-09-05 DIAGNOSIS — G8929 Other chronic pain: Secondary | ICD-10-CM

## 2017-09-05 MED ORDER — FLUTICASONE PROPIONATE 50 MCG/ACT NA SUSP: 2.0000 | Freq: Every day | NASAL | 6 refills | Status: DC

## 2017-09-05 MED ORDER — CYCLOBENZAPRINE HCL 5 MG PO TABS: ORAL_TABLET | ORAL | 0 refills | Status: DC

## 2017-09-05 NOTE — Telephone Encounter (Signed)
Patient was seen in the office today and stated she sent in a kit and has not heard anything.  I called [employer] and spoke with Jackson Latino and she stated they left a message for the pt to return their call prior to sending another kit.  I gave the pt the phone number to call [phone].

## 2017-09-05 NOTE — Progress Notes (Signed)
HPI:  Using dictation device. Unfortunately this device frequently misinterprets words/phrases.  :Acute visit for back pain: -Right low back -Has been intermittent for greater than 10 years, several flares per year -She has seen a back specialist remotely, Dr. Eddie Dibbles, now retired -She has been managing conservatively with ibuprofen or Tylenol and heat for flares -This started 1 week ago, unsure of trigger -Pain is moderate to severe in the right low back, can radiate to right buttock and upper right posterior leg- -denies fevers, malaise, weakness, numbness, bowel or bladder incontinence  ROS: See pertinent positives and negatives per HPI.  Past Medical History:  Diagnosis Date  . Abnormal EKG   . Asthma   . Bipolar depression ([place]), Anxiety, PTSD, Panic disorder    -managed by [facility]  . Foot pain   . Insomnia   . Leg swelling   . Osteoporosis   . Tachycardia     Past Surgical History:  Procedure Laterality Date  . ABLATION    . CARPAL TUNNEL RELEASE    . CESAREAN SECTION    . CHOLECYSTECTOMY    . KNEE SURGERY    . TUBAL LIGATION    . ULNAR NERVE REPAIR      Family History  Problem Relation Age of Onset  . Heart failure Mother   . Hypertension Mother   . Hyperlipidemia Mother   . Breast cancer Neg Hx     SOCIAL HX: see hpi   Current Outpatient Medications:  .  albuterol (PROVENTIL) (2.5 MG/3ML) 0.083% nebulizer solution, Take 2.5 mg by nebulization every 6 (six) hours as needed for wheezing or shortness of breath., Disp: , Rfl:  .  Albuterol Sulfate (PROAIR RESPICLICK) 220 (90 Base) MCG/ACT AEPB, Inhale 2 puffs into the lungs every 6 (six) hours as needed., Disp: 1 each, Rfl: 0 .  ALPRAZolam (XANAX) 0.5 MG tablet, Take 0.25-0.5 mg by mouth daily as needed for anxiety., Disp: , Rfl:  .  busPIRone (BUSPAR) 15 MG tablet, Take 15 mg by mouth 3 (three) times daily., Disp: , Rfl:  .  CALCIUM PO, Take 1 tablet by mouth daily., Disp: , Rfl:  .   chlorpheniramine (CHLOR-TRIMETON) 4 MG tablet, Take 4 mg by mouth every 4 (four) hours as needed for allergies., Disp: , Rfl:  .  Cholecalciferol (VITAMIN D-3 PO), Take 1 tablet by mouth daily., Disp: , Rfl:  .  dextromethorphan-guaiFENesin (MUCINEX DM) 30-600 MG 12hr tablet, Take 1 tablet by mouth 2 (two) times daily as needed for cough., Disp: , Rfl:  .  doxazosin (CARDURA) 4 MG tablet, Take 6 mg by mouth daily., Disp: , Rfl:  .  Ibuprofen (IBU-200 PO), Take 1-3 tablets by mouth daily as needed (for pain)., Disp: , Rfl:  .  lithium 300 MG tablet, Take 600 mg by mouth daily., Disp: , Rfl:  .  montelukast (SINGULAIR) 10 MG tablet, Take 1 tablet (10 mg total) by mouth at bedtime., Disp: 30 tablet, Rfl: 11 .  Multiple Vitamins-Minerals (MULTIVITAMIN WITH MINERALS) tablet, Take 1 tablet by mouth daily., Disp: , Rfl:  .  Probiotic Product (PROBIOTIC PO), Take by mouth daily., Disp: , Rfl:  .  propranolol (INDERAL) 20 MG tablet, Take 20 mg by mouth. Take 1-2 tablets twice a day, Disp: , Rfl:  .  triamcinolone cream (KENALOG) 0.1 %, Apply 1 application topically 2 (two) times daily. For itchy patch on R hand, Disp: 30 g, Rfl: 0 .  cyclobenzaprine (FLEXERIL) 5 MG tablet, 1 tablet before bed  as needed for muscle spasm, Disp: 14 tablet, Rfl: 0  EXAM:  Vitals:   09/05/17 1533  BP: 102/60  Pulse: 64  Temp: 98.1 F (36.7 C)    Body mass index is 31.33 kg/m.  GENERAL: vitals reviewed and listed above, alert, oriented, appears well hydrated and in no acute distress  HEENT: atraumatic, conjunttiva clear, no obvious abnormalities on inspection of external nose and ears  NECK: no obvious masses on inspection  LUNGS: clear to auscultation bilaterally, no wheezes, rales or rhonchi, good air movement  CV: HRRR, no peripheral edema  MS: moves all extremities without noticeable abnormality Normal Gait Normal inspection of back, no obvious scoliosis or leg length descrepancy No bony TTP Soft tissue  TTP at: R lumbar paraspinal soft tissues, L4 posterior lateral tender point -/+ tests: neg trendelenburg,-facet loading, -SLRT, -CLRT, -FABER, -FADIR Normal muscle strength, sensation to light touch and DTRs in LEs bilaterally  PSYCH: pleasant and cooperative, no obvious depression or anxiety  ASSESSMENT AND PLAN:  Discussed the following assessment and plan:  Chronic right-sided low back pain with right-sided sciatica  Somatic dysfunction of lumbar region  -we discussed possible serious and likely etiologies, workup and treatment, treatment risks and return precautions -low back pain with some reported radicular symptoms; no neuro deficits on exam, chronic and intermittent -after this discussion, Arnesha opted for conservative treatment with over-the-counter analgesics, muscle relaxer, osteopathic treatment (see below, she opted to treat w/out verifying insurance coverage), HEP -she declined steroid -follow up advised 2-3 weeks -of course, we advised Avigayil  to return or notify a doctor immediately if symptoms worsen  or new concerns arise.  PROCEDURE NOTE : OSTEOPATHIC TREATMENT The decision today to treat with gentle Osteopathic Manipulative Therapy  (OMT) was based on physical exam findings, diagnoses and patient wishes. Verbal consent was obtained after after explanation of risks and benefits. No Cervical HVLA manipulation was performed. After consent was obtained, treatment was  performed as below:      Regions treated:  lumbar     Techniques used: counterstrain The patient tolerated the treatment well and reported Improved  symptoms following treatment today. Follow up treatment was advised in: 2-4 weeks    Patient Instructions  BEFORE YOU LEAVE: -Low back exercises -Please check on the Cologuard, she completed and sent it, but company never received it; then they were supposed to resuspend the test, but she never received it -Please reorder the Cologuard and  make sure the company is aware that she has had many issues with this -follow up: 2-4 weeks for bowel issues and follow-up back pain  Heat for 15 minutes at least twice daily  Do the exercises at least 4 days/week  Tiger balm is a good option to use topically for pain.  Otherwise, Tylenol Flexeril at night as needed. Lucretia Kern, DO

## 2017-09-05 NOTE — Patient Instructions (Signed)
BEFORE YOU LEAVE: -Low back exercises -Please check on the Cologuard, she completed and sent it, but company never received it; then they were supposed to resuspend the test, but she never received it -Please reorder the Cologuard and make sure the company is aware that she has had many issues with this -follow up: 2-4 weeks for bowel issues and follow-up back pain  Heat for 15 minutes at least twice daily  Do the exercises at least 4 days/week  Tiger balm is a good option to use topically for pain.  Otherwise, Tylenol Flexeril at night as needed. Marland Kitchen

## 2017-09-11 ENCOUNTER — Other Ambulatory Visit: Payer: Self-pay | Admitting: Internal Medicine

## 2017-09-23 DIAGNOSIS — F3132 Bipolar disorder, current episode depressed, moderate: Secondary | ICD-10-CM | POA: Diagnosis not present

## 2017-09-25 NOTE — Progress Notes (Signed)
HPI:  Using dictation device. Unfortunately this device frequently misinterprets words/phrases.  Follow up R low back pain: -chronic, > 10 years -flare 08/2017 -used to see back specialist (Dr. Eddie Dibbles) -several flares per year -sometimes radicular symptoms to R buttock,post leg -treated last visit with OMT, muscle relaxer, analgesic, HEP -reports: resolved, started back exercising the last few days and had a little bit of soreness return   Abd pain, diarrhea: -better with change in diet - she is not eating glute or dairy, but still has diarrhea sometimes with abd discomfort -no unexplained wt loss, melena, hematochezia -due for colon cancer screening - saw dr Collene Mares in the past  Takes a number of supplements, wonders which to take. Has to take with food or upset stomach. Difficulty affording her allergy meds.  Allergies: -needs rx for flonase to afford -takin chlorphenermine instead, tired -takes singulair -reports asthma symptoms ok   ROS: See pertinent positives and negatives per HPI.  Past Medical History:  Diagnosis Date  . Abnormal EKG   . Asthma   . Bipolar depression ([place]), Anxiety, PTSD, Panic disorder    -managed by [facility]  . Foot pain   . Insomnia   . Leg swelling   . Osteoporosis   . Tachycardia     Past Surgical History:  Procedure Laterality Date  . ABLATION    . CARPAL TUNNEL RELEASE    . CESAREAN SECTION    . CHOLECYSTECTOMY    . KNEE SURGERY    . TUBAL LIGATION    . ULNAR NERVE REPAIR      Family History  Problem Relation Age of Onset  . Heart failure Mother   . Hypertension Mother   . Hyperlipidemia Mother   . Breast cancer Neg Hx     SOCIAL HX: see hpi   Current Outpatient Medications:  .  albuterol (PROVENTIL) (2.5 MG/3ML) 0.083% nebulizer solution, Take 2.5 mg by nebulization every 6 (six) hours as needed for wheezing or shortness of breath., Disp: , Rfl:  .  Albuterol Sulfate (PROAIR RESPICLICK) 381 (90 Base) MCG/ACT  AEPB, Inhale 2 puffs into the lungs every 6 (six) hours as needed., Disp: 1 each, Rfl: 0 .  ALPRAZolam (XANAX) 0.5 MG tablet, Take 0.25-0.5 mg by mouth daily as needed for anxiety., Disp: , Rfl:  .  busPIRone (BUSPAR) 15 MG tablet, Take 15 mg by mouth 3 (three) times daily., Disp: , Rfl:  .  CARBAMAZEPINE ER PO, Take 200 mg by mouth at bedtime., Disp: , Rfl:  .  Cholecalciferol (VITAMIN D-3 PO), Take 1 tablet by mouth daily., Disp: , Rfl:  .  doxazosin (CARDURA) 4 MG tablet, Take 6 mg by mouth daily., Disp: , Rfl:  .  lithium 300 MG tablet, Take 600 mg by mouth daily., Disp: , Rfl:  .  montelukast (SINGULAIR) 10 MG tablet, TAKE 1 TABLET BY MOUTH AT BEDTIME, Disp: 30 tablet, Rfl: 0 .  Multiple Vitamins-Minerals (MULTIVITAMIN WITH MINERALS) tablet, Take 1 tablet by mouth daily., Disp: , Rfl:  .  Probiotic Product (PROBIOTIC PO), Take by mouth daily., Disp: , Rfl:  .  propranolol (INDERAL) 20 MG tablet, Take 20 mg by mouth. Take 1-2 tablets twice a day, Disp: , Rfl:  .  triamcinolone cream (KENALOG) 0.1 %, Apply 1 application topically 2 (two) times daily. For itchy patch on R hand, Disp: 30 g, Rfl: 0 .  fluticasone (FLONASE) 50 MCG/ACT nasal spray, Place 2 sprays into both nostrils daily., Disp: 16 g, Rfl: 6  EXAM:  Vitals:   09/26/17 1055  BP: 90/60  Pulse: 69  Temp: 98.4 F (36.9 C)    Body mass index is 33.1 kg/m.  GENERAL: vitals reviewed and listed above, alert, oriented, appears well hydrated and in no acute distress  HEENT: atraumatic, conjunttiva clear, no obvious abnormalities on inspection of external nose and ears  NECK: no obvious masses on inspection  LUNGS: clear to auscultation bilaterally, no wheezes, rales or rhonchi, good air movement  CV: HRRR, no peripheral edema  MS: moves all extremities without noticeable abnormality  PSYCH: pleasant and cooperative, no obvious depression or anxiety  ASSESSMENT AND PLAN:  Discussed the following assessment and  plan:  Chronic low back pain without sciatica, unspecified back pain laterality -glad resolved, soreness from exercising does not need tx - but is to follow up if persists or other symptoms or severe  Abdominal pain, unspecified abdominal location - Diarrhea, unspecified type - Colon cancer screening - Plan: Ambulatory referral to Gastroenterology (Dr. Collene Mares in referral notes as she reports seen there prior) -suggested eating gluten until that appt in case celiac testing needed/labs  Environmental allergies -advised restart flonase, stop chlorphenermine, allegra or claritin  disccused risks/benefits various supplements and advise to stop MV, calcium and vit K Continue vit D3 and b12 and discussed doseing provided names of products recommend by [employer]  -Patient advised to return or notify a doctor immediately if symptoms worsen or persist or new concerns arise.  Patient Instructions  BEFORE YOU LEAVE: -follow up: 4-6 months  Daily exercise. Healthy diet.  Flonase 2 sprays each nostril daily for 1 month, then 1 spray each nostril daily.  Claritin or Allegra.  -We placed a referral for you as discussed to Dr. Collene Mares. It usually takes about 1-2 weeks to process and schedule this referral. If you have not heard from [place] regarding this appointment in 2 weeks please contact our office.     Lucretia Kern, DO

## 2017-09-26 ENCOUNTER — Ambulatory Visit (INDEPENDENT_AMBULATORY_CARE_PROVIDER_SITE_OTHER): Payer: PPO | Admitting: Family Medicine

## 2017-09-26 ENCOUNTER — Encounter: Payer: Self-pay | Admitting: Family Medicine

## 2017-09-26 VITALS — BP 90/60 | HR 69 | Temp 98.4°F | Ht 61.0 in | Wt 175.2 lb

## 2017-09-26 DIAGNOSIS — G8929 Other chronic pain: Secondary | ICD-10-CM

## 2017-09-26 DIAGNOSIS — M545 Low back pain: Secondary | ICD-10-CM

## 2017-09-26 DIAGNOSIS — Z9109 Other allergy status, other than to drugs and biological substances: Secondary | ICD-10-CM

## 2017-09-26 DIAGNOSIS — R197 Diarrhea, unspecified: Secondary | ICD-10-CM

## 2017-09-26 DIAGNOSIS — Z1211 Encounter for screening for malignant neoplasm of colon: Secondary | ICD-10-CM | POA: Diagnosis not present

## 2017-09-26 DIAGNOSIS — R109 Unspecified abdominal pain: Secondary | ICD-10-CM

## 2017-09-26 MED ORDER — FLUTICASONE PROPIONATE 50 MCG/ACT NA SUSP: 2.0000 | Freq: Every day | NASAL | 6 refills | Status: DC

## 2017-09-26 NOTE — Patient Instructions (Signed)
BEFORE YOU LEAVE: -follow up: 4-6 months  Daily exercise. Healthy diet.  Flonase 2 sprays each nostril daily for 1 month, then 1 spray each nostril daily.  Claritin or Allegra.  -We placed a referral for you as discussed to Dr. Collene Mares. It usually takes about 1-2 weeks to process and schedule this referral. If you have not heard from [place] regarding this appointment in 2 weeks please contact our office.

## 2017-10-01 DIAGNOSIS — H16223 Keratoconjunctivitis sicca, not specified as Sjogren's, bilateral: Secondary | ICD-10-CM | POA: Diagnosis not present

## 2017-10-12 ENCOUNTER — Other Ambulatory Visit: Payer: Self-pay | Admitting: Internal Medicine

## 2017-10-16 ENCOUNTER — Other Ambulatory Visit: Payer: Self-pay | Admitting: Internal Medicine

## 2017-11-06 DIAGNOSIS — F3132 Bipolar disorder, current episode depressed, moderate: Secondary | ICD-10-CM | POA: Diagnosis not present

## 2017-11-18 NOTE — Progress Notes (Signed)
Subjective:   Brittany [name] is a 62 y.o. female who presents for Medicare Annual (Subsequent) preventive examinatio  Reports health as fair  CPE with Dr. Maudie Mercury in april OV 06/06/2017  Family is her motivation dtr to be married Son;  Oldest dtr died  [place] kids 28  Moved into 5 + community   Next apt is 02/27/2018    Diet BMI 34.2  Was on ketogenic diet; lost 40 lbs x 62 yo; Got out of the habit  Dr. Maudie Mercury checking for celiac disease and requested she eat more wheat;  but that made her crave more carbs so she has stopped this; plans to go back on ketogenic diet    Exercise [employer] next Tuesday  May get a treadmill from children soon    Health Maintenance Due  Topic Date Due  . COLONOSCOPY  08/14/2005   Tried the colo-guard, they lost it Dr. Maudie Mercury sent a referral over to GI - when she called she gave you a bill Will figure out who is in the network and find another MD     Mammogram in 10/2016 - will schedule this year  Dexa in 08/2016 -2.1  Educated on Vit D  Educated regarding shingrix - covered under part D Pap 04/2014 -      Objective:     Vitals: There were no vitals taken for this visit.  There is no height or weight on file to calculate BMI.  Advanced Directives 11/16/2016 09/13/2014  Does Patient Have a Medical Advance Directive? Yes No  Would patient like information on creating a medical advance directive? - No - patient declined information   dtr is her POW for medical and fianancial  Tobacco Social History   Tobacco Use  Smoking Status Former Smoker  . Packs/day: 20.00  . Years: 1.00  . Pack years: 20.00  . Last attempt to quit: 06/19/1999  . Years since quitting: 18.4  Smokeless Tobacco Never Used  Tobacco Comment   20 year estimate but probably less      Counseling given: Not Answered Comment: 20 year estimate but probably less    Clinical Intake:     Past Medical History:  Diagnosis Date  .  Abnormal EKG   . Asthma   . Bipolar depression ([place]), Anxiety, PTSD, Panic disorder    -managed by [facility]  . Foot pain   . Insomnia   . Leg swelling   . Osteoporosis   . Tachycardia    Past Surgical History:  Procedure Laterality Date  . ABLATION    . CARPAL TUNNEL RELEASE    . CESAREAN SECTION    . CHOLECYSTECTOMY    . KNEE SURGERY    . TUBAL LIGATION    . ULNAR NERVE REPAIR     Family History  Problem Relation Age of Onset  . Heart failure Mother   . Hypertension Mother   . Hyperlipidemia Mother   . Breast cancer Neg Hx    Social History   Socioeconomic History  . Marital status: Single    Spouse name: Not on file  . Number of children: Not on file  . Years of education: Not on file  . Highest education level: Not on file  Occupational History  . Not on file  Social Needs  . Financial resource strain: Not on file  . Food insecurity:    Worry: Not on file    Inability: Not on file  .  Transportation needs:    Medical: Not on file    Non-medical: Not on file  Tobacco Use  . Smoking status: Former Smoker    Packs/day: 20.00    Years: 1.00    Pack years: 20.00    Last attempt to quit: 06/19/1999    Years since quitting: 18.4  . Smokeless tobacco: Never Used  . Tobacco comment: 20 year estimate but probably less   Substance and Sexual Activity  . Alcohol use: Yes    Alcohol/week: 0.0 oz    Comment: Occass.   . Drug use: No  . Sexual activity: Never  Lifestyle  . Physical activity:    Days per week: Not on file    Minutes per session: Not on file  . Stress: Not on file  Relationships  . Social connections:    Talks on phone: Not on file    Gets together: Not on file    Attends religious service: Not on file    Active member of club or organization: Not on file    Attends meetings of clubs or organizations: Not on file    Relationship status: Not on file  Other Topics Concern  . Not on file  Social History Narrative   Work or School:  Disabled 2ndary to psychiatric conditions      Home Situation: lives alone with 2 cats and one dog      Spiritual Beliefs: Christian      Lifestyle: no regular exercise, diet  Not great - wants to embark on healthier lifestyle    Outpatient Encounter Medications as of 11/19/2017  Medication Sig  . albuterol (PROVENTIL) (2.5 MG/3ML) 0.083% nebulizer solution Take 2.5 mg by nebulization every 6 (six) hours as needed for wheezing or shortness of breath.  . Albuterol Sulfate (PROAIR RESPICLICK) 295 (90 Base) MCG/ACT AEPB Inhale 2 puffs into the lungs every 6 (six) hours as needed.  . ALPRAZolam (XANAX) 0.5 MG tablet Take 0.25-0.5 mg by mouth daily as needed for anxiety.  . busPIRone (BUSPAR) 15 MG tablet Take 15 mg by mouth 3 (three) times daily.  Marland Kitchen CARBAMAZEPINE ER PO Take 200 mg by mouth at bedtime.  . Cholecalciferol (VITAMIN D-3 PO) Take 1 tablet by mouth daily.  Marland Kitchen doxazosin (CARDURA) 4 MG tablet Take 6 mg by mouth daily.  . fluticasone (FLONASE) 50 MCG/ACT nasal spray Place 2 sprays into both nostrils daily.  Marland Kitchen lithium 300 MG tablet Take 600 mg by mouth daily.  . montelukast (SINGULAIR) 10 MG tablet TAKE 1 TABLET BY MOUTH AT BEDTIME  . Multiple Vitamins-Minerals (MULTIVITAMIN WITH MINERALS) tablet Take 1 tablet by mouth daily.  . Probiotic Product (PROBIOTIC PO) Take by mouth daily.  . propranolol (INDERAL) 20 MG tablet Take 20 mg by mouth. Take 1-2 tablets twice a day  . triamcinolone cream (KENALOG) 0.1 % Apply 1 application topically 2 (two) times daily. For itchy patch on R hand   No facility-administered encounter medications on file as of 11/19/2017.     Activities of Daily Living No flowsheet data found.  Patient Care Team: Lucretia Kern, DO as PCP - General (Family Medicine)    Assessment:   This is a routine wellness examination for Brittany [name].  Exercise Activities and Dietary recommendations    Goals    . Weight (lb) < 150 lb (68 kg)     Will start walking!  Can walk in  [place] plus community Would walk at 6:30 in am and pm  Benefits of doing  this weight loss, strengthening lung and heart Energy; building up metabolism  Benefit of not doing it? Losing muscle tone and weight gain More sagging skin   Motivation 1-10 it is a 4 Why isn't it a 1 or 2; because you will get up and do what you can in your apt. What is the comparison of walking vs not walking? Picture the outcome of more inelasticity;  Can be better;   Will take inhaler prior to walking; start off slow as it has hills  Keep a log daily and see how you feel at certain temps or humidity          Fall Risk Fall Risk  11/16/2016 09/13/2014  Falls in the past year? Yes No  Number falls in past yr: 1 -  Follow up Education provided -  Comment tripped  -     Depression Screen PHQ 2/9 Scores 11/16/2016 09/13/2014  PHQ - 2 Score 0 3  PHQ- 9 Score - 11     Cognitive Function MMSE - Mini Mental State Exam 11/16/2016  Not completed: (No Data)     Ad8 score reviewed for issues:  Issues making decisions:  Less interest in hobbies / activities:  Repeats questions, stories (family complaining):  Trouble using ordinary gadgets (microwave, computer, phone):  Forgets the month or year:   Mismanaging finances:   Remembering appts:  Daily problems with thinking and/or memory: Ad8 score is=0  Relies on the alarm system on her iphone        Immunization History  Administered Date(s) Administered  . Influenza Split 04/26/2011  . Influenza Whole 03/18/2009, 03/18/2010, 03/18/2016  . Influenza,inj,Quad PF,6+ Mos 04/19/2014, 02/28/2017  . Pneumococcal Conjugate-13 03/10/2015  . Pneumococcal Polysaccharide-23 06/18/2005  . Tdap 12/04/2016    Screening Tests Health Maintenance  Topic Date Due  . COLONOSCOPY  08/14/2005  . HIV Screening  08/24/2026 (Originally 08/14/1970)  . INFLUENZA VACCINE  01/16/2018  . PAP SMEAR  07/20/2018  . DEXA SCAN  08/28/2018  . MAMMOGRAM  11/01/2018  .  TETANUS/TDAP  12/05/2026  . Hepatitis C Screening  Completed        Plan:      PCP Notes   Health Maintenance Will check insurance for Colonoscopy  She has to check with insurance regarding colonoscopy; as when she called her prior provider she was told she owed 100.00 from 10 years ago. Now looking for another provider.   Needs pap this year and is considering GYN  Will fup with Dr. Maudie Mercury at August if she decides to have a PAP in this office   Will schedule her mammogram which is due now  To fup on shingrix at the pharmacy   Abnormal Screens  dexa -2.1; discussed strength bearing exercise; agreed to Vit D 1000 to 2000 per day, was on 5000 at one time. Will fup for Vit D level with Dr. Maudie Mercury at her next visit. Explained the recommendation was 800 to 1000 u if she is not in the sun  Referrals  none  Patient concerns; More urine leakage but may be medication Educated on kegel exercise 3 times a day with 20 reps x 2 to 3 months     Nurse Concerns; The patient reported Dr. Maudie Mercury checking for celiac disease and requested she eat more wheat;  but that made her crave more carbs so she has stopped this; plans to go back on ketogenic diet  Next PCP apt 08/12/21019    I have personally reviewed and  noted the following in the patient's chart:   . Medical and social history . Use of alcohol, tobacco or illicit drugs  . Current medications and supplements . Functional ability and status . Nutritional status . Physical activity . Advanced directives . List of other physicians . Hospitalizations, surgeries, and ER visits in previous 12 months . Vitals . Screenings to include cognitive, depression, and falls . Referrals and appointments  In addition, I have reviewed and discussed with patient certain preventive protocols, quality metrics, and best practice recommendations. A written personalized care plan for preventive services as well as general preventive health recommendations  were provided to patient.     Wynetta Fines, RN  11/18/2017

## 2017-11-19 ENCOUNTER — Ambulatory Visit (INDEPENDENT_AMBULATORY_CARE_PROVIDER_SITE_OTHER): Payer: PPO

## 2017-11-19 VITALS — BP 120/80 | HR 67 | Ht 61.0 in | Wt 181.2 lb

## 2017-11-19 DIAGNOSIS — Z0000 Encounter for general adult medical examination without abnormal findings: Secondary | ICD-10-CM

## 2017-11-19 DIAGNOSIS — F3130 Bipolar disorder, current episode depressed, mild or moderate severity, unspecified: Secondary | ICD-10-CM | POA: Diagnosis not present

## 2017-11-19 NOTE — Patient Instructions (Addendum)
Brittany [name] , Thank you for taking time to come for your Medicare Wellness Visit. I appreciate your ongoing commitment to your health goals. Please review the following plan we discussed and let me know if I can assist you in the future.   May need PAP - as your last one was 04/2014; let us know if you want Dr. Maudie Mercury to do this   Just to ask your pharmacist about collagen since your dtr feels this is helpful  NIH site and they have a section on herbs  [email or web address].johnson-fowler.[email or web address]  Educated to check with insurance regarding coverage of Shingles vaccination on Part D or Part B and may have lower co-pay if provided on the Part D side Shingrix is a vaccine for the prevention of Shingles in Adults 50 and older.  If you are on Medicare, the shingrix is covered under your Part D plan, so you will take both of the vaccines in the series at your pharmacy. Please check with your benefits regarding applicable copays or out of pocket expenses.  The Shingrix is given in 2 vaccines approx 8 weeks apart. You must receive the 2nd dose prior to 6 months from receipt of the first. Please have the pharmacist print out you Immunization  dates for our office records    Will call the doctor to discuss eye drops as the otc didn't help   Will check your insurance as to who is in network to fup on your colonoscopy and stomach issues   Try yoga by Adriane which is free  The nature trail may try walking  To find a buddy this year   Will schedule your mammogram   Recommendations for Dexa Scan Female over the age of 11 Man age 56 or older If you broke a bone past the age of 64 Women menopausal age with risk factors (thin frame; smoker; hx of fx ) Post menopausal women under the age of 40 with risk factors A man age 69 to 36 with risk factors Other: Spine xray that is showing break of bone loss Back pain with possible break Height loss of 1/2 inch or more within one year Total loss in height  of 1.5 inches from your original height  Calcium 1276m with Vit D 800u per day; more as directed by physician Strength building exercises discussed; can include walking; housework; small weights or stretch bands; silver sneakers if access to the Y  Please visit the osteoporosis foundation.org for up to date recommendations    These are the goals we discussed: Goals    . Weight (lb) < 150 lb (68 kg)     Will start walking!  Can walk in 55 plus community Would walk at 6:30 in am and pm  Benefits of doing this weight loss, strengthening lung and heart Energy; building up metabolism  Benefit of not doing it? Losing muscle tone and weight gain More sagging skin   Motivation 1-10 it is a 4 Why isn't it a 1 or 2; because you will get up and do what you can in your apt. What is the comparison of walking vs not walking? Picture the outcome of more inelasticity;  Can be better;   Will take inhaler prior to walking; start off slow as it has hills  Keep a log daily and see how you feel at certain temps or humidity          This is a list of the screening recommended for you and due dates:  Health Maintenance  Topic Date Due  . Colon Cancer Screening  08/14/2005  . HIV Screening  08/24/2026*  . Flu Shot  01/16/2018  . Pap Smear  07/20/2018  . DEXA scan (bone density measurement)  08/28/2018  . Mammogram  11/01/2018  . Tetanus Vaccine  12/05/2026  .  Hepatitis C: One time screening is recommended by Center for Disease Control  (CDC) for  adults born from [birth year] through 1965.   Completed  *Topic was postponed. The date shown is not the original due date.   Community Occupational psychologist of Services Cost  A Matter of Balance Class locations vary. Call [place] on Aging for more information.  http://dawson-may.com/ [phone] 8-Session program addressing the fear of falling and increasing activity levels of older adults Free to  minimal cost  A.C.T. By [employer] [address] [place], [place] [address].  BetaBlues.dk [phone]  Personal training, gym, classes including Silver Sneakers* and ACTion for Aging Adults Fee-based  A.H.O.Y. (Add Health to [place]) Airs on Time [employer] 13, M-F at [place]: [employer],  [place] [facility] [place],  [place], [facility], [address] [facility], [place], [place], [place] [address]  [place] Location: Sharrell Ku. [employer] [place]      [phone]  [phone]  [phone]  [phone]  [phone]  [phone]  [phone]  [phone]  [phone]  [phone]    [phone] A total-body conditioning class for adults 18 and older; designed to increase muscular strength, endurance, range of movement, flexibility, balance, agility and coordination Free  [facility] [place], [place] [address] [place]      [address]      [phone]      Pilate's class for individualsreturning to exercise after an injury, before or after surgery or for individuals with complex musculoskeletal issues; designed to improve strength, balance , flexibility      $15/class  [place] [address] [place], [place] [address] [email or web address] [place] classes for beginners to advanced [place],   [address] [email or web address] [place] Chair Exercises Free, ages 69 and older; Ages 18-59 fee based  Marvia Pickles, [facility] [address] [place],  [address] Seniorcenter_1 .Beverlee Nims [phone]  A.H.O.Y. Tai Chi Fee-based Donation based or free  [place] Class locations vary.  Call or email Angela Burke or view website for more information. Info_[email or web address] .com GainPain.com.[email or web address] [phone] Ongoing classes at local YMCAs and gyms Fee-based  Silver Sneakers A.C.T. By [place] [employer]: [place] [phone]  [phone] Classes designed for older adults who want to improve their strength, flexibility, balance and endurance.   Silver sneakers is covered by some  insurance plans and includes a fitness center membership at participating locations. Find out more by calling [phone] or visiting [email or web address] Covered by some insurance plans  [facility] [phone] A.H.O.Y., fitness room, personal training, fitness classes for injury prevention, strength, balance, flexibility, water fitness classes Ages 55+: $45 for 6 months; Ages 54-54: $2 for 6 months  Tai Chi for Everybody [facility] [address] [place], [place] [address] Taichiforeverybody_0 .Patsi Sears [phone] Tai Chi classes for beginners to advanced; geared for seniors Donation Based      [employer] ([place] Others Participate in Exercise     Loyal Gambler. Rosana Hoes, PhD, [place]  [email or web address] [place]     [phone]     A comprehensive fitness program for adults.  The program [employer] senior-level undergraduates Kinesiology students with adults who desire to learn how to exercise safely.  Includes a structural exercise class focusing on functional fitnesss     $100/semester in fall and spring; $75 in summer (no trainers)    *Silver Sneakers is covered by some Personal assistant and includes a  Radio producer at participating locations.  Find out more by calling [phone] or visiting [email or web address]  For additional health and human services resources for senior adults, please contact SeniorLine at [phone] in [place] and [place] at [phone] in all other areas.  Bone Densitometry Bone densitometry is an imaging test that uses a special X-ray to measure the amount of calcium and other minerals in your bones (bone density). This test is also known as a bone mineral density test or dual-energy X-ray absorptiometry (DXA). The test can measure bone density at your hip and your spine. It is similar to having a regular X-ray. You may have this test to:  Diagnose a condition that causes weak or thin bones (osteoporosis).  Predict your risk of a broken bone (fracture).  Determine how well osteoporosis treatment is working.  Tell a health care provider about:  Any allergies you have.  All medicines you are taking, including vitamins, herbs, eye drops, creams, and over-the-counter medicines.  Any problems you or family members have had with anesthetic medicines.  Any blood disorders you have.  Any surgeries you have had.  Any medical conditions you have.  Possibility of pregnancy.  Any other medical test you had within the previous 14 days that used contrast material. What are the risks? Generally, this is a safe procedure. However, problems can occur and may include the following:  This test exposes you to a very small amount  of radiation.  The risks of radiation exposure may be greater to unborn children.  What happens before the procedure?  Do not take any calcium supplements for 24 hours before having the test. You can otherwise eat and drink what you usually do.  Take off all metal jewelry, eyeglasses, dental appliances, and any other metal objects. What happens during the procedure?  You may lie on an exam table. There will be an X-ray generator below you and an imaging device above you.  Other devices, such as boxes or braces, may be used to position your body properly for the scan.  You will need to lie still while the machine slowly scans your body.  The images will show up on a computer monitor. What happens after the procedure? You may need more testing at a later time. This information is not intended to replace advice given to you by your health care provider. Make sure you  discuss any questions you have with your health care provider. Document Released: 06/26/2004 Document Revised: 11/10/2015 Document Reviewed: 11/12/2013 Elsevier Interactive Patient Education  2018 [place] in the Home Falls can cause injuries. They can happen to people of all ages. There are many things you can do to make your home safe and to help prevent falls. What can I do on the outside of my home?  Regularly fix the edges of walkways and driveways and fix any cracks.  Remove anything that might make you trip as you walk through a door, such as a raised step or threshold.  Trim any bushes or trees on the path to your home.  Use bright outdoor lighting.  Clear any walking paths of anything that might make someone trip, such as rocks or tools.  Regularly check to see if handrails are loose or broken. Make sure that both sides of any steps have handrails.  Any raised decks and porches should have guardrails on the edges.  Have any leaves, snow, or ice cleared regularly.  Use sand or  salt on walking paths during winter.  Clean up any spills in your garage right away. This includes oil or grease spills. What can I do in the bathroom?  Use night lights.  Install grab bars by the toilet and in the tub and shower. Do not use towel bars as grab bars.  Use non-skid mats or decals in the tub or shower.  If you need to sit down in the shower, use a plastic, non-slip stool.  Keep the floor dry. Clean up any water that spills on the floor as soon as it happens.  Remove soap buildup in the tub or shower regularly.  Attach bath mats securely with double-sided non-slip rug tape.  Do not have throw rugs and other things on the floor that can make you trip. What can I do in the bedroom?  Use night lights.  Make sure that you have a light by your bed that is easy to reach.  Do not use any sheets or blankets that are too big for your bed. They should not hang down onto the floor.  Have a firm chair that has side arms. You can use this for support while you get dressed.  Do not have throw rugs and other things on the floor that can make you trip. What can I do in the kitchen?  Clean up any spills right away.  Avoid walking on wet floors.  Keep items that you use a lot in easy-to-reach places.  If you need to reach something above you, use a strong step stool that has a grab bar.  Keep electrical cords out of the way.  Do not use floor polish or wax that makes floors slippery. If you must use wax, use non-skid floor wax.  Do not have throw rugs and other things on the floor that can make you trip. What can I do with my stairs?  Do not leave any items on the stairs.  Make sure that there are handrails on both sides of the stairs and use them. Fix handrails that are broken or loose. Make sure that handrails are as long as the stairways.  Check any carpeting to make sure that it is firmly attached to the stairs. Fix any carpet that is loose or worn.  Avoid having  throw rugs at the top or bottom of the stairs. If you do have throw rugs, attach them  to the floor with carpet tape.  Make sure that you have a light switch at the top of the stairs and the bottom of the stairs. If you do not have them, ask someone to add them for you. What else can I do to help prevent falls?  Wear shoes that: ? Do not have high heels. ? Have rubber bottoms. ? Are comfortable and fit you well. ? Are closed at the toe. Do not wear sandals.  If you use a stepladder: ? Make sure that it is fully opened. Do not climb a closed stepladder. ? Make sure that both sides of the stepladder are locked into place. ? Ask someone to hold it for you, if possible.  Clearly mark and make sure that you can see: ? Any grab bars or handrails. ? First and last steps. ? Where the edge of each step is.  Use tools that help you move around (mobility aids) if they are needed. These include: ? Canes. ? Walkers. ? Scooters. ? Crutches.  Turn on the lights when you go into a dark area. Replace any light bulbs as soon as they burn out.  Set up your furniture so you have a clear path. Avoid moving your furniture around.  If any of your floors are uneven, fix them.  If there are any pets around you, be aware of where they are.  Review your medicines with your doctor. Some medicines can make you feel dizzy. This can increase your chance of falling. Ask your doctor what other things that you can do to help prevent falls. This information is not intended to replace advice given to you by your health care provider. Make sure you discuss any questions you have with your health care provider. Document Released: 03/31/2009 Document Revised: 11/10/2015 Document Reviewed: 07/09/2014 Elsevier Interactive Patient Education  2018 [place] Maintenance, Female Adopting a healthy lifestyle and getting preventive care can go a long way to promote health and wellness. Talk with your health  care provider about what schedule of regular examinations is right for you. This is a good chance for you to check in with your provider about disease prevention and staying healthy. In between checkups, there are plenty of things you can do on your own. Experts have done a lot of research about which lifestyle changes and preventive measures are most likely to keep you healthy. Ask your health care provider for more information. Weight and diet Eat a healthy diet  Be sure to include plenty of vegetables, fruits, low-fat dairy products, and lean protein.  Do not eat a lot of foods high in solid fats, added sugars, or salt.  Get regular exercise. This is one of the most important things you can do for your health. ? Most adults should exercise for at least 150 minutes each week. The exercise should increase your heart rate and make you sweat (moderate-intensity exercise). ? Most adults should also do strengthening exercises at least twice a week. This is in addition to the moderate-intensity exercise.  Maintain a healthy weight  Body mass index (BMI) is a measurement that can be used to identify possible weight problems. It estimates body fat based on height and weight. Your health care provider can help determine your BMI and help you achieve or maintain a healthy weight.  For females 50 years of age and older: ? A BMI below 18.5 is considered underweight. ? A BMI of 18.5 to 24.9 is normal. ? A BMI  of 25 to 29.9 is considered overweight. ? A BMI of 30 and above is considered obese.  Watch levels of cholesterol and blood lipids  You should start having your blood tested for lipids and cholesterol at 62 years of age, then have this test every 5 years.  You may need to have your cholesterol levels checked more often if: ? Your lipid or cholesterol levels are high. ? You are older than 62 years of age. ? You are at high risk for heart disease.  Cancer screening Lung Cancer  Lung cancer  screening is recommended for adults 17-66 years old who are at high risk for lung cancer because of a history of smoking.  A yearly low-dose CT scan of the lungs is recommended for people who: ? Currently smoke. ? Have quit within the past 15 years. ? Have at least a 30-pack-year history of smoking. A pack year is smoking an average of one pack of cigarettes a day for 1 year.  Yearly screening should continue until it has been 15 years since you quit.  Yearly screening should stop if you develop a health problem that would prevent you from having lung cancer treatment.  Breast Cancer  Practice breast self-awareness. This means understanding how your breasts normally appear and feel.  It also means doing regular breast self-exams. Let your health care provider know about any changes, no matter how small.  If you are in your 20s or 30s, you should have a clinical breast exam (CBE) by a health care provider every 1-3 years as part of a regular health exam.  If you are 43 or older, have a CBE every year. Also consider having a breast X-ray (mammogram) every year.  If you have a family history of breast cancer, talk to your health care provider about genetic screening.  If you are at high risk for breast cancer, talk to your health care provider about having an MRI and a mammogram every year.  Breast cancer gene (BRCA) assessment is recommended for women who have family members with BRCA-related cancers. BRCA-related cancers include: ? Breast. ? Ovarian. ? Tubal. ? Peritoneal cancers.  Results of the assessment will determine the need for genetic counseling and BRCA1 and BRCA2 testing.  Cervical Cancer Your health care provider may recommend that you be screened regularly for cancer of the pelvic organs (ovaries, uterus, and vagina). This screening involves a pelvic examination, including checking for microscopic changes to the surface of your cervix (Pap test). You may be encouraged to  have this screening done every 3 years, beginning at age 88.  For women ages 75-65, health care providers may recommend pelvic exams and Pap testing every 3 years, or they may recommend the Pap and pelvic exam, combined with testing for human papilloma virus (HPV), every 5 years. Some types of HPV increase your risk of cervical cancer. Testing for HPV may also be done on women of any age with unclear Pap test results.  Other health care providers may not recommend any screening for nonpregnant women who are considered low risk for pelvic cancer and who do not have symptoms. Ask your health care provider if a screening pelvic exam is right for you.  If you have had past treatment for cervical cancer or a condition that could lead to cancer, you need Pap tests and screening for cancer for at least 20 years after your treatment. If Pap tests have been discontinued, your risk factors (such as having a new sexual  partner) need to be reassessed to determine if screening should resume. Some women have medical problems that increase the chance of getting cervical cancer. In these cases, your health care provider may recommend more frequent screening and Pap tests.  Colorectal Cancer  This type of cancer can be detected and often prevented.  Routine colorectal cancer screening usually begins at 62 years of age and continues through 62 years of age.  Your health care provider may recommend screening at an earlier age if you have risk factors for colon cancer.  Your health care provider may also recommend using home test kits to check for hidden blood in the stool.  A small camera at the end of a tube can be used to examine your colon directly (sigmoidoscopy or colonoscopy). This is done to check for the earliest forms of colorectal cancer.  Routine screening usually begins at age 68.  Direct examination of the colon should be repeated every 5-10 years through 62 years of age. However, you may need to be  screened more often if early forms of precancerous polyps or small growths are found.  Skin Cancer  Check your skin from head to toe regularly.  Tell your health care provider about any new moles or changes in moles, especially if there is a change in a mole's shape or color.  Also tell your health care provider if you have a mole that is larger than the size of a pencil eraser.  Always use sunscreen. Apply sunscreen liberally and repeatedly throughout the day.  Protect yourself by wearing long sleeves, pants, a wide-brimmed hat, and sunglasses whenever you are outside.  Heart disease, diabetes, and high blood pressure  High blood pressure causes heart disease and increases the risk of stroke. High blood pressure is more likely to develop in: ? People who have blood pressure in the high end of the normal range (130-139/85-89 mm Hg). ? People who are overweight or obese. ? People who are African American.  If you are 63-48 years of age, have your blood pressure checked every 3-5 years. If you are 66 years of age or older, have your blood pressure checked every year. You should have your blood pressure measured twice-once when you are at a hospital or clinic, and once when you are not at a hospital or clinic. Record the average of the two measurements. To check your blood pressure when you are not at a hospital or clinic, you can use: ? An automated blood pressure machine at a pharmacy. ? A home blood pressure monitor.  If you are between 53 years and 68 years old, ask your health care provider if you should take aspirin to prevent strokes.  Have regular diabetes screenings. This involves taking a blood sample to check your fasting blood sugar level. ? If you are at a normal weight and have a low risk for diabetes, have this test once every three years after 62 years of age. ? If you are overweight and have a high risk for diabetes, consider being tested at a younger age or more  often. Preventing infection Hepatitis B  If you have a higher risk for hepatitis B, you should be screened for this virus. You are considered at high risk for hepatitis B if: ? You were born in a country where hepatitis B is common. Ask your health care provider which countries are considered high risk. ? Your parents were born in a high-risk country, and you have not been immunized against  hepatitis B (hepatitis B vaccine). ? You have HIV or AIDS. ? You use needles to inject street drugs. ? You live with someone who has hepatitis B. ? You have had sex with someone who has hepatitis B. ? You get hemodialysis treatment. ? You take certain medicines for conditions, including cancer, organ transplantation, and autoimmune conditions.  Hepatitis C  Blood testing is recommended for: ? Everyone born from [birth year] through 1965. ? Anyone with known risk factors for hepatitis C.  Sexually transmitted infections (STIs)  You should be screened for sexually transmitted infections (STIs) including gonorrhea and chlamydia if: ? You are sexually active and are younger than 62 years of age. ? You are older than 62 years of age and your health care provider tells you that you are at risk for this type of infection. ? Your sexual activity has changed since you were last screened and you are at an increased risk for chlamydia or gonorrhea. Ask your health care provider if you are at risk.  If you do not have HIV, but are at risk, it may be recommended that you take a prescription medicine daily to prevent HIV infection. This is called pre-exposure prophylaxis (PrEP). You are considered at risk if: ? You are sexually active and do not regularly use condoms or know the HIV status of your partner(s). ? You take drugs by injection. ? You are sexually active with a partner who has HIV.  Talk with your health care provider about whether you are at high risk of being infected with HIV. If you choose to begin PrEP,  you should first be tested for HIV. You should then be tested every 3 months for as long as you are taking PrEP. Pregnancy  If you are premenopausal and you may become pregnant, ask your health care provider about preconception counseling.  If you may become pregnant, take 400 to 800 micrograms (mcg) of folic acid every day.  If you want to prevent pregnancy, talk to your health care provider about birth control (contraception). Osteoporosis and menopause  Osteoporosis is a disease in which the bones lose minerals and strength with aging. This can result in serious bone fractures. Your risk for osteoporosis can be identified using a bone density scan.  If you are 43 years of age or older, or if you are at risk for osteoporosis and fractures, ask your health care provider if you should be screened.  Ask your health care provider whether you should take a calcium or vitamin D supplement to lower your risk for osteoporosis.  Menopause may have certain physical symptoms and risks.  Hormone replacement therapy may reduce some of these symptoms and risks. Talk to your health care provider about whether hormone replacement therapy is right for you. Follow these instructions at home:  Schedule regular health, dental, and eye exams.  Stay current with your immunizations.  Do not use any tobacco products including cigarettes, chewing tobacco, or electronic cigarettes.  If you are pregnant, do not drink alcohol.  If you are breastfeeding, limit how much and how often you drink alcohol.  Limit alcohol intake to no more than 1 drink per day for nonpregnant women. One drink equals 12 ounces of beer, 5 ounces of wine, or 1 ounces of hard liquor.  Do not use street drugs.  Do not share needles.  Ask your health care provider for help if you need support or information about quitting drugs.  Tell your health care provider if you often  feel depressed.  Tell your health care provider if you  have ever been abused or do not feel safe at home. This information is not intended to replace advice given to you by your health care provider. Make sure you discuss any questions you have with your health care provider. Document Released: 12/18/2010 Document Revised: 11/10/2015 Document Reviewed: 03/08/2015 Elsevier Interactive Patient Education  [employer].

## 2017-11-20 NOTE — Progress Notes (Signed)
Brittany R Kim, DO  

## 2017-11-25 ENCOUNTER — Other Ambulatory Visit: Payer: Self-pay | Admitting: Internal Medicine

## 2017-11-25 MED ORDER — MONTELUKAST SODIUM 10 MG PO TABS: 10.0000 mg | ORAL_TABLET | Freq: Every day | ORAL | 0 refills | Status: DC

## 2017-12-11 DIAGNOSIS — F3132 Bipolar disorder, current episode depressed, moderate: Secondary | ICD-10-CM | POA: Diagnosis not present

## 2017-12-20 ENCOUNTER — Other Ambulatory Visit: Payer: Self-pay | Admitting: Internal Medicine

## 2017-12-25 ENCOUNTER — Telehealth: Payer: Self-pay | Admitting: Family Medicine

## 2017-12-25 NOTE — Telephone Encounter (Signed)
Copied from [id number]. Topic: Quick Communication - Rx Refill/Question >> Dec 25, 2017  2:08 PM Selinda Flavin B, NT wrote: Medication: montelukast (SINGULAIR) 10 MG tablet  Has the patient contacted their pharmacy? Yes.   (Agent: If no, request that the patient contact the pharmacy for the refill.) (Agent: If yes, when and what did the pharmacy advise?)  Preferred Pharmacy (with phone number or street name): [facility] [address] [place], [place]: Please be advised that RX refills may take up to 3 business days. We ask that you follow-up with your pharmacy.  Patient states that Dr Maudie Mercury told her that she could start filling this medication instead of the patient's pulmonologist.

## 2017-12-26 MED ORDER — MONTELUKAST SODIUM 10 MG PO TABS: 10.0000 mg | ORAL_TABLET | Freq: Every day | ORAL | 1 refills | Status: DC

## 2017-12-26 NOTE — Telephone Encounter (Signed)
Rx done. 

## 2017-12-26 NOTE — Telephone Encounter (Signed)
Ok to refill for 90 days with 1 refill  

## 2018-01-03 ENCOUNTER — Encounter: Payer: Self-pay | Admitting: Gastroenterology

## 2018-01-16 DIAGNOSIS — F3132 Bipolar disorder, current episode depressed, moderate: Secondary | ICD-10-CM | POA: Diagnosis not present

## 2018-01-26 NOTE — Progress Notes (Signed)
HPI:  Using dictation device. Unfortunately this device frequently misinterprets words/phrases.  Brittany [name] is a pleasant 62 y.o. here for follow up. Chronic medical problems summarized below were reviewed for changes.  Reports she is not doing well.  Reports her depression and chronic fatigue continue to worsen despite the addition of many medications.  She feels that the medicines are causing her symptoms.  She reports each time she sees her psychiatrist complains of the fatigue, more medications are added, which caused worsening fatigue, which then contributes to depression.  Reports she feels frustrated, as it takes way too many medications, yet feels worse and worse.  No suicidal ideation.  She has chronic fatigue, chronic poor energy, poor sleep, snoring, weight gain on the psychiatric medications.  Reports she will be checking her lithium and carbamazepine levels tomorrow for Dr. Charlott Holler.  Reports she also wanted her to check a thyroid and vitamin D level.  She continues to struggle with chronic indigestion and diarrhea.  Reports she has a appointment with Dr. Modena Nunnery in gastroenterology for evaluation of this and also for her colon cancer screening.  She has never been tested for sleep apnea.  Denies CP, SOB, DOE, treatment intolerance or new symptoms. She is trying to eat healthy with vegetables and lean proteins, yet her weight continues to increase.  She feels this is secondary to the psychiatric medications.  AWV 11/19/17 Obesity, hyperlipidemia, osteoarthritis: -reports 12/18 doing better with increased exercise and diet ok -psych meds have increased her weight -she is interested in healthy lifestyle  Osteopenia: -early menopause in early 74s -dexa done 08/2016 with osteopenia and below treatment thresholods - reports her psychiatrist advised her to stop Vit D and check level, so currently not taking  Asthma/GERD: -seeing pulmonologist, Dr. Melvyn Novas -meds: singulair,  albprn  Bipolar disorder/anxiety/panic disorder: -sees [facility] -on disability for psych issues -meds: xanax, buspar, lithium, carbamazepine , prozac, cardura - for night terrors, ambien  Inverted L nipple: -reports new for 1-2 years at 6/18 visit --> referred to breast center -she reports did eval, told normal and to do normal yearly mammograms -no pain, mass, discharge, skin lesion   ROS: See pertinent positives and negatives per HPI.  Past Medical History:  Diagnosis Date  . Abnormal EKG   . Asthma   . Bipolar depression ([place]), Anxiety, PTSD, Panic disorder    -managed by [facility]  . Foot pain   . Insomnia   . Leg swelling   . Osteoporosis   . Tachycardia     Past Surgical History:  Procedure Laterality Date  . ABLATION    . CARPAL TUNNEL RELEASE    . CESAREAN SECTION    . CHOLECYSTECTOMY    . KNEE SURGERY    . TUBAL LIGATION    . ULNAR NERVE REPAIR      Family History  Problem Relation Age of Onset  . Heart failure Mother   . Hypertension Mother   . Hyperlipidemia Mother   . Breast cancer Neg Hx     SOCIAL HX: See HPI   Current Outpatient Medications:  .  Albuterol Sulfate (PROAIR RESPICLICK) 967 (90 Base) MCG/ACT AEPB, Inhale 2 puffs into the lungs every 6 (six) hours as needed., Disp: 1 each, Rfl: 0 .  ALPRAZolam (XANAX) 0.5 MG tablet, Take 0.25-0.5 mg by mouth daily as needed for anxiety., Disp: , Rfl:  .  busPIRone (BUSPAR) 15 MG tablet, Take 15 mg by  mouth 2 (two) times daily. , Disp: , Rfl:  .  CARBAMAZEPINE ER PO, Take 400 mg by mouth at bedtime. , Disp: , Rfl:  .  Cholecalciferol (VITAMIN D-3 PO), Take 1 tablet by mouth daily., Disp: , Rfl:  .  doxazosin (CARDURA) 4 MG tablet, Take 6 mg by mouth daily., Disp: , Rfl:  .  FLUoxetine HCl (PROZAC PO), Take by mouth., Disp: , Rfl:  .  fluticasone (FLONASE) 50 MCG/ACT nasal spray, Place 2 sprays into both nostrils  daily., Disp: 16 g, Rfl: 6 .  lithium 300 MG tablet, Take 600 mg by mouth daily., Disp: , Rfl:  .  lithium carbonate 150 MG capsule, Take 150 mg by mouth daily. , Disp: , Rfl: 1 .  montelukast (SINGULAIR) 10 MG tablet, Take 1 tablet (10 mg total) by mouth at bedtime., Disp: 90 tablet, Rfl: 1 .  Multiple Vitamins-Minerals (MULTIVITAMIN WITH MINERALS) tablet, Take 1 tablet by mouth daily., Disp: , Rfl:  .  Probiotic Product (PROBIOTIC PO), Take by mouth daily., Disp: , Rfl:  .  triamcinolone cream (KENALOG) 0.1 %, Apply 1 application topically 2 (two) times daily. For itchy patch on R hand, Disp: 30 g, Rfl: 0  EXAM:  Vitals:   01/27/18 1108  BP: 128/60  Pulse: 72  Temp: 98.1 F (36.7 C)  SpO2: 97%    Body mass index is 35.01 kg/m.  GENERAL: vitals reviewed and listed above, alert, oriented, appears well hydrated and in no acute distress  HEENT: atraumatic, conjunttiva clear, no obvious abnormalities on inspection of external nose and ears  NECK: no obvious masses on inspection  LUNGS: clear to auscultation bilaterally, no wheezes, rales or rhonchi, good air movement  CV: HRRR, no peripheral edema  MS: moves all extremities without noticeable abnormality  PSYCH: pleasant and cooperative, no obvious depression or anxiety  ASSESSMENT AND PLAN:  Discussed the following assessment and plan:  Chronic fatigue - Plan: Basic metabolic panel, CBC, Hemoglobin A1c, TSH, VITAMIN D 25 Hydroxy (Vit-D Deficiency, Fractures), Vitamin B12, Ambulatory referral to Pulmonology  Morbid obesity ([place]) - Plan: Ambulatory referral to Pulmonology  Hyperlipidemia, unspecified hyperlipidemia type  Bipolar affective disorder, current episode depressed, current episode severity unspecified ([place])  Snoring - Plan: Ambulatory referral to Pulmonology  -Had a lengthy discussion about chronic fatigue, various causes, polypharmacy -We will check some labs for the fatigue including diabetes screening,  basic metabolic panel, blood counts, thyroid screening, vitamin D level and B12 levels -Encouraged a healthy diet and regular exercise, which she already is doing it sounds like -Advised her to schedule an appointment with her psychiatrist to talk about her concerns regarding her medications, perhaps she would benefit from slowly tapering off of some of her medicines to see if she feels worse or better, she feels like the medicines have not helped.  She has orders from her psychiatrist to check her medication levels and plans to do this tomorrow. -We also will send a referral for sleep apnea testing to see if this is contributing.  Denies cardiac symptoms. -She is seeing GI about the gastrointestinal symptoms, I am glad she finally got the scheduled.  I also recommended that she do her colon cancer screening as soon as possible, she reports she will discuss this with the gastroenterologist.   -Patient advised to return or notify a doctor immediately if symptoms worsen or persist or new concerns arise.  Patient Instructions  BEFORE YOU LEAVE: -labs -follow up: 3 months  See the Gastroenterologist as  planned for the intestinal issues and colon cancer screening.  See your psychiatrist, Dr. Clovis Pu regarding your concerns with your psychiatry medications. Get the labs he requested.  Eat a healthy diet and get regular exercise.  -We placed a referral for you as discussed for a sleep apnea evaluation. It usually takes about 1-2 weeks to process and schedule this referral. If you have not heard from [place] regarding this appointment in 2 weeks please contact our office.   We have ordered labs or studies at this visit. It can take up to 1-2 weeks for results and processing. IF results require follow up or explanation, we will call you with instructions. Clinically stable results will be released to your [facility]. If you have not heard from [place] or cannot find your results in [facility] in 2 weeks please contact  our office at [phone].  If you are not yet signed up for [facility], please consider signing up.          Lucretia Kern, DO

## 2018-01-27 ENCOUNTER — Ambulatory Visit (INDEPENDENT_AMBULATORY_CARE_PROVIDER_SITE_OTHER): Payer: PPO | Admitting: Family Medicine

## 2018-01-27 ENCOUNTER — Encounter: Payer: Self-pay | Admitting: Family Medicine

## 2018-01-27 VITALS — BP 128/60 | HR 72 | Temp 98.1°F | Ht 61.0 in | Wt 185.3 lb

## 2018-01-27 DIAGNOSIS — E785 Hyperlipidemia, unspecified: Secondary | ICD-10-CM | POA: Diagnosis not present

## 2018-01-27 DIAGNOSIS — R5382 Chronic fatigue, unspecified: Secondary | ICD-10-CM

## 2018-01-27 DIAGNOSIS — R0683 Snoring: Secondary | ICD-10-CM | POA: Diagnosis not present

## 2018-01-27 DIAGNOSIS — F3130 Bipolar disorder, current episode depressed, mild or moderate severity, unspecified: Secondary | ICD-10-CM

## 2018-01-27 LAB — CBC
HEMATOCRIT: 42.2 % (ref 36.0–46.0)
Hemoglobin: 14.3 g/dL (ref 12.0–15.0)
MCHC: 33.9 g/dL (ref 30.0–36.0)
MCV: 92.4 fl (ref 78.0–100.0)
Platelets: 311 10*3/uL (ref 150.0–400.0)
RBC: 4.57 Mil/uL (ref 3.87–5.11)
RDW: 13.3 % (ref 11.5–15.5)
WBC: 4.6 10*3/uL (ref 4.0–10.5)

## 2018-01-27 LAB — BASIC METABOLIC PANEL
BUN: 11 mg/dL (ref 6–23)
CHLORIDE: 104 meq/L (ref 96–112)
CO2: 30 mEq/L (ref 19–32)
CREATININE: 0.77 mg/dL (ref 0.40–1.20)
Calcium: 9.5 mg/dL (ref 8.4–10.5)
GFR: 80.61 mL/min (ref >60.00)
Glucose, Bld: 95 mg/dL (ref 70–99)
POTASSIUM: 4.7 meq/L (ref 3.5–5.1)
Sodium: 139 mEq/L (ref 135–145)

## 2018-01-27 LAB — VITAMIN D 25 HYDROXY (VIT D DEFICIENCY, FRACTURES): VITD: 23.89 ng/mL — AB (ref 30.00–100.00)

## 2018-01-27 LAB — HEMOGLOBIN A1C: HEMOGLOBIN A1C: 4.9 % (ref 4.6–6.5)

## 2018-01-27 LAB — VITAMIN B12: VITAMIN B 12: 326 pg/mL (ref 211–911)

## 2018-01-27 LAB — TSH: TSH: 2.26 u[IU]/mL (ref 0.35–4.50)

## 2018-01-27 NOTE — Patient Instructions (Signed)
BEFORE YOU LEAVE: -labs -follow up: 3 months  See the Gastroenterologist as planned for the intestinal issues and colon cancer screening.  See your psychiatrist, Dr. Clovis Pu regarding your concerns with your psychiatry medications. Get the labs he requested.  Eat a healthy diet and get regular exercise.  -We placed a referral for you as discussed for a sleep apnea evaluation. It usually takes about 1-2 weeks to process and schedule this referral. If you have not heard from [place] regarding this appointment in 2 weeks please contact our office.   We have ordered labs or studies at this visit. It can take up to 1-2 weeks for results and processing. IF results require follow up or explanation, we will call you with instructions. Clinically stable results will be released to your [facility]. If you have not heard from [place] or cannot find your results in [facility] in 2 weeks please contact our office at [phone].  If you are not yet signed up for [facility], please consider signing up.

## 2018-02-05 DIAGNOSIS — F3160 Bipolar disorder, current episode mixed, unspecified: Secondary | ICD-10-CM | POA: Diagnosis not present

## 2018-03-03 ENCOUNTER — Institutional Professional Consult (permissible substitution): Payer: PPO | Admitting: Pulmonary Disease

## 2018-03-06 ENCOUNTER — Ambulatory Visit (INDEPENDENT_AMBULATORY_CARE_PROVIDER_SITE_OTHER): Payer: PPO

## 2018-03-06 DIAGNOSIS — Z23 Encounter for immunization: Secondary | ICD-10-CM | POA: Diagnosis not present

## 2018-03-06 DIAGNOSIS — F3132 Bipolar disorder, current episode depressed, moderate: Secondary | ICD-10-CM | POA: Diagnosis not present

## 2018-03-10 ENCOUNTER — Encounter: Payer: Self-pay | Admitting: Gastroenterology

## 2018-03-10 ENCOUNTER — Ambulatory Visit: Payer: PPO | Admitting: Gastroenterology

## 2018-03-10 VITALS — BP 104/80 | HR 80 | Ht 61.5 in | Wt 181.5 lb

## 2018-03-10 DIAGNOSIS — R1084 Generalized abdominal pain: Secondary | ICD-10-CM | POA: Diagnosis not present

## 2018-03-10 DIAGNOSIS — R197 Diarrhea, unspecified: Secondary | ICD-10-CM

## 2018-03-10 NOTE — Progress Notes (Addendum)
Chief Complaint: "I need to know what's going on with my stomach";  Abdominal pain and explosive diarrhea  Referring Provider: Dr. Colin Benton       ASSESSMENT AND PLAN:   Chronic diarrhea    - started 16 years ago at the time of cholecystectomy    - intermittent for many years    - progressively worse over the last 6 months    - now having 5-7 BM daily    - no alarm features Abdominal pain and bloating    - improved with diet changes: avoiding caffeine, sugar, flour, dairy    - break through symptoms improved with Xanax    - prior diagnosis of IBS by Dr. Collene Mares in 2009, treated with Align Mild glottal insufficiency or vocal dysphonia Moderate reflux on esophagram 2008 Normal screening colonoscopy with Dr. Collene Mares 2009    - 3 hyperplastic polyps, pancolonic diverticulosis Multiple stressors Cholecystectomy for cholelithiasis 16 years ago  Broad differential given her chronic symptoms: bile acid diarrhea, diarrhea-predominant IBS, functional diarrhea,  microscopic colitis, celiac, bacterial overgrowth +/- concurrent lactose intolerance and less likely IBD. Carbamazepine is an extremely rare cause of diarrhea, although it may have been contributing to her other GI symptoms.   - EGD with duodenal biopsies and colonoscopy with random biopsies recommended - Obtain prior colonoscopy report from Dr. Collene Mares. - Endoscopy results will guide treatment recommendations   I consented the patient today. We discussed the risks, benefits, and alternatives to endoscopic evaluation. In particular, we reviewed the risks that include, but are not limited to, reaction to medication, cardiopulmonary compromise, bleeding requiring blood transfusion, aspiration resulting in pneumonia, perforation requiring surgery, and even death. The patient acknowledges these risks and asks that we proceed.   ADDENDUM: Received records from Dr. Collene Mares from 2009. Acid reflux with prior diagnosis of mild glottal  insufficiency or vocal dysphonia. A barium swallow in 2008 showed mild delay in the relaxation of the cricopharyngeus, moderate reflux, and mild esophageal dysmotility. Dr. Collene Mares diagnosed her with IBS and recommended a high fiber diet. She was prescribed Align for gas and blooding. Colonoscopy 2009 showed pancolonic diverticulosis, 3 small hyperplastic rectosigmoid polyps, normal terminal ileum.   HPI:    Seen in consultation at the request of Dr. Maudie Mercury.  The history is obtained through the patient and review of her electronic medical record. "I need to know what's going on with my stomach."  Primary symptoms is diarrhea with associated abdominal bloat and flatus. Defecation of undigested food appears within minutes of eating. Explosive diarrhea described as formed but very soft stools followed by a lot of gas. She has been having 5-7 BM daily. Abdominal pain is diffuse, can be severe, feels like stabbing or punching. Improved with defecation. Had an accident with diarrhea while in public 6-8 years ago. Symptoms exacerbated by stress. She has significant financial stress now.   Symptoms first started after GB was removed 16 years ago. She would have intermittent symptoms 2-3 times a year.  They were not bothersome enough to seek care. However, symptoms progressive since that time, particularly over the last year.  Symptoms limit her ability to leave the house. She avoid going out.  Using Immodiums PRN to go out to control the diarrhea and Xanax PRN for the abdominal pain and bloat. Does not use Immodium of Xanax daily.  She thinks carbamazepine was contributing as there was some improvement, although not complete  resolution, of her symptoms after discontinuation of the carbamazipime.   Avoiding caffene, dairy including cheeses and ice cream provides some improvement. Also sensitive to sugar. She is trying to eat healthy with vegetables and lean proteins. Lost 40 pounds on Keto and then gained 35 back over  the holidays last year. Her doctor told her to resume a regular diet in the event that we would test for celiac.   Does not like to take medications. She feels like she takes enough medications already.  Has not tried any other treatments to control her symptoms beyond those mentioned above. No other associated symptoms. No identified exacerbating or relieving features.   Prior colonoscopy in Dr. Collene Mares at age 9. Patient remembers a normal exam. Her symptoms were sporadic at that time and these symptoms were not investigated.   Maternal aunt with precancerous colon polyps. No other family history of colon cancer or polyps.    Only abdominal imaging available to me through [facility] is an ultrasound from 2002 that showed cholelithiasis with all stones in the gallbladder.   Daughter just got a engaged and she wants to feel better for the wedding.   Past Medical History:  Diagnosis Date  . Abnormal EKG   . Asthma   . Bipolar depression ([place]), Anxiety, PTSD, Panic disorder    -managed by [facility]  . Foot pain   . Insomnia   . Leg swelling   . Osteoporosis   . Tachycardia      Past Surgical History:  Procedure Laterality Date  . ABLATION    . CARPAL TUNNEL RELEASE    . CESAREAN SECTION    . CHOLECYSTECTOMY    . KNEE SURGERY    . TUBAL LIGATION    . ULNAR NERVE REPAIR     Family History  Problem Relation Age of Onset  . Heart failure Mother   . Hypertension Mother   . Hyperlipidemia Mother   . Breast cancer Neg Hx    Social History   Tobacco Use  . Smoking status: Former Smoker    Packs/day: 20.00    Years: 1.00    Pack years: 20.00    Last attempt to quit: 06/19/1999    Years since quitting: 18.7  . Smokeless tobacco: Never Used  . Tobacco comment: 20 year estimate but probably less   Substance Use Topics  . Alcohol use: Yes    Alcohol/week: 0.0 standard drinks    Comment: Occass.   . Drug use: No   Current Outpatient Medications  Medication Sig Dispense  Refill  . ALPRAZolam (XANAX) 0.5 MG tablet Take 0.25-0.5 mg by mouth as needed for anxiety.     . busPIRone (BUSPAR) 15 MG tablet Take 15 mg by mouth 2 (two) times daily.     . Cholecalciferol (VITAMIN D-3 PO) Take 1 tablet by mouth daily.    Marland Kitchen doxazosin (CARDURA) 4 MG tablet Take 6 mg by mouth daily.    . fluticasone (FLONASE) 50 MCG/ACT nasal spray Place 2 sprays into both nostrils daily. 16 g 6  . lithium 300 MG tablet Take 600 mg by mouth daily.    Marland Kitchen lithium carbonate 150 MG capsule Take 150 mg by mouth daily.   1  . montelukast (SINGULAIR) 10 MG tablet Take 1 tablet (10 mg total) by mouth at bedtime. 90 tablet 1  . Multiple Vitamins-Minerals (MULTIVITAMIN WITH MINERALS) tablet Take 1 tablet by mouth daily.    Marland Kitchen triamcinolone cream (KENALOG) 0.1 % Apply 1 application  topically 2 (two) times daily. For itchy patch on R hand 30 g 0  . Albuterol Sulfate (PROAIR RESPICLICK) 509 (90 Base) MCG/ACT AEPB Inhale 2 puffs into the lungs every 6 (six) hours as needed. (Patient not taking: Reported on 03/10/2018) 1 each 0  . Probiotic Product (PROBIOTIC PO) Take by mouth daily.     No current facility-administered medications for this visit.    Allergies  Allergen Reactions  . Aspirin Other (See Comments)    discomfort  . Codeine Itching  . Sulfamethoxazole-Trimethoprim Other (See Comments)    Unknown reaction per pt  . Trimethoprim Other (See Comments)    Unknown, per pt  . Ultram [Tramadol] Itching  . Vioxx [Rofecoxib]     Unknown per pt     Review of Systems: All systems reviewed and negative except where noted in HPI.   Physical Exam:    Wt Readings from Last 3 Encounters:  03/10/18 181 lb 8 oz (82.3 kg)  01/27/18 185 lb 4.8 oz (84.1 kg)  11/19/17 181 lb 3 oz (82.2 kg)    Ht 5' 1.5" (1.562 m) Comment: height measured without shoes  Wt 181 lb 8 oz (82.3 kg)   BMI 33.74 kg/m  Constitutional:  Pleasant female in no acute distress. Psychiatric: Normal mood and affect. Behavior is  normal. EENT: Pupils normal.  Conjunctivae are normal. No scleral icterus. Neck supple.  Cardiovascular: Normal rate, regular rhythm. No edema Pulmonary/chest: Effort normal and breath sounds normal. No wheezing, rales or rhonchi. Abdominal: Soft, mild central obesity, nondistended, mild diffuse tenderness that is most pronounced in the lower abdomen. Bowel sounds active throughout. There are no masses palpable. No hepatomegaly. Neurological: Alert and oriented to person place and time. Skin: Skin is warm and dry. Tattoo on the right shoulder. No rashes noted.  Thornton Park, MD, MPH  03/10/2018, 9:56 AM   Lucretia Kern, DO

## 2018-03-10 NOTE — Patient Instructions (Signed)
I have not recommended any changes in your medications at this time. We can readdress therapeutic options after your colonoscopy and EGD results are available.

## 2018-03-19 ENCOUNTER — Ambulatory Visit (INDEPENDENT_AMBULATORY_CARE_PROVIDER_SITE_OTHER): Payer: PPO | Admitting: Pulmonary Disease

## 2018-03-19 ENCOUNTER — Encounter: Payer: Self-pay | Admitting: Pulmonary Disease

## 2018-03-19 VITALS — BP 110/82 | HR 83 | Ht 61.5 in | Wt 173.0 lb

## 2018-03-19 DIAGNOSIS — F5105 Insomnia due to other mental disorder: Secondary | ICD-10-CM | POA: Diagnosis not present

## 2018-03-19 DIAGNOSIS — F99 Mental disorder, not otherwise specified: Secondary | ICD-10-CM | POA: Diagnosis not present

## 2018-03-19 DIAGNOSIS — R0683 Snoring: Secondary | ICD-10-CM | POA: Diagnosis not present

## 2018-03-19 NOTE — Patient Instructions (Signed)
History of snoring  Daytime fatigue  Insomnia-chronic, controlled with temazepam in the past  We will schedule a home sleep study for the diagnosis of possible obstructive sleep apnea  If study is positive-an auto titrating machine may be used to treat sleep disordered breathing  If study is negative, a sedating antidepressant may be an option--trazodone, temazepam may also be reinitiated  I will see you back about 8 to 12 weeks following initiation of treatment

## 2018-03-19 NOTE — Progress Notes (Signed)
Brittany [name]    [id number]    23-Jul-1955  Primary Care Physician:Kim, Nickola Major, DO  Referring Physician: Lucretia Kern, DO [address] [place], [place] [address]  Chief complaint:  History of chronic insomnia, known snorer She was on temazepam in the past that seemed to help, this was stopped about 4 years ago  HPI:  History of chronic insomnia, history of PTSD, bipolar disorder Has not tolerated some medications in the past She feels the adjustment of her medications is a lot better at present Did not tolerate Ambien in the past Was recently taken off Prozac and carbamazepine secondary to side effects  Has never been able to sleep for more than 5 hours Wakes up exhausted and tired on most days Usually tries to go to bed between 11 and 12 Wakes up about 3-4 times Usually sleeps for about 1 to 2 hours before waking up Usually gets up between 3:30 to 5:30 in the morning  She is fatigued most days Weight has been stable She used to exercise on a regular basis   Occupation: No pertinent history Exposures: No significant exposure Smoking history: Reformed smoker  Outpatient Encounter Medications as of 03/19/2018  Medication Sig  . Albuterol Sulfate (PROAIR RESPICLICK) 366 (90 Base) MCG/ACT AEPB Inhale 2 puffs into the lungs every 6 (six) hours as needed.  . ALPRAZolam (XANAX) 0.5 MG tablet Take 0.25-0.5 mg by mouth as needed for anxiety.   . busPIRone (BUSPAR) 15 MG tablet Take 15 mg by mouth 2 (two) times daily.   . Cholecalciferol (VITAMIN D-3 PO) Take 1 tablet by mouth daily.  Marland Kitchen doxazosin (CARDURA) 4 MG tablet Take 6 mg by mouth daily.  . fluticasone (FLONASE) 50 MCG/ACT nasal spray Place 2 sprays into both nostrils daily.  Marland Kitchen lithium 300 MG tablet Take 600 mg by mouth daily.  Marland Kitchen lithium carbonate 150 MG capsule Take 150 mg by mouth daily.   . Melatonin 3 MG TABS Take 1 tablet by mouth at bedtime.  . montelukast (SINGULAIR) 10 MG tablet Take 1  tablet (10 mg total) by mouth at bedtime.  . Multiple Vitamins-Minerals (MULTIVITAMIN WITH MINERALS) tablet Take 1 tablet by mouth daily.  Marland Kitchen triamcinolone cream (KENALOG) 0.1 % Apply 1 application topically 2 (two) times daily. For itchy patch on R hand  . [DISCONTINUED] Probiotic Product (PROBIOTIC PO) Take by mouth daily.   No facility-administered encounter medications on file as of 03/19/2018.     Allergies as of 03/19/2018 - Review Complete 03/19/2018  Allergen Reaction Noted  . Aspirin Other (See Comments)   . Codeine Itching 09/26/2017  . Sulfamethoxazole-trimethoprim Other (See Comments)   . Trimethoprim Other (See Comments) 11/12/2014  . Ultram [tramadol] Itching 09/26/2017  . Vioxx [rofecoxib]      Past Medical History:  Diagnosis Date  . Abnormal EKG   . Anxiety   . Asthma   . Bipolar depression ([place]), Anxiety, PTSD, Panic disorder    -managed by [facility]  . Depression   . Foot pain   . GERD (gastroesophageal reflux disease)   . Insomnia   . Leg swelling   . Osteoporosis   . Tachycardia     Past Surgical History:  Procedure Laterality Date  . CARPAL TUNNEL RELEASE Left   . CESAREAN SECTION     x 2  . CHOLECYSTECTOMY    . ENDOMETRIAL ABLATION    . KNEE SURGERY Right   . TUBAL LIGATION    .  ULNAR NERVE REPAIR Left     Family History  Problem Relation Age of Onset  . Heart failure Mother   . Hypertension Mother   . Hyperlipidemia Mother   . COPD Paternal Aunt        alot of aunts and uncle COPD or Emphysema  . Emphysema Paternal Uncle   . Breast cancer Neg Hx     Social History   Socioeconomic History  . Marital status: Single    Spouse name: Not on file  . Number of children: 3  . Years of education: Not on file  . Highest education level: Not on file  Occupational History  . Occupation: disabled  Social Needs  . Financial resource strain: Not on file  . Food insecurity:    Worry: Not on file    Inability: Not on file  .  Transportation needs:    Medical: Not on file    Non-medical: Not on file  Tobacco Use  . Smoking status: Former Smoker    Packs/day: 20.00    Years: 1.00    Pack years: 20.00    Last attempt to quit: 06/19/1999    Years since quitting: 18.7  . Smokeless tobacco: Never Used  . Tobacco comment: 20 year estimate but probably less   Substance and Sexual Activity  . Alcohol use: Yes    Alcohol/week: 0.0 standard drinks    Comment: Occass.   . Drug use: No  . Sexual activity: Never  Lifestyle  . Physical activity:    Days per week: Not on file    Minutes per session: Not on file  . Stress: Not on file  Relationships  . Social connections:    Talks on phone: Not on file    Gets together: Not on file    Attends religious service: Not on file    Active member of club or organization: Not on file    Attends meetings of clubs or organizations: Not on file    Relationship status: Not on file  . Intimate partner violence:    Fear of current or ex partner: Not on file    Emotionally abused: Not on file    Physically abused: Not on file    Forced sexual activity: Not on file  Other Topics Concern  . Not on file  Social History Narrative   Work or School: Disabled 2ndary to psychiatric conditions      Home Situation: lives alone with 2 cats and one dog      Spiritual Beliefs: Christian      Lifestyle: no regular exercise, diet  Not great - wants to embark on healthier lifestyle    Review of Systems  Constitutional: Positive for fatigue.  HENT: Negative.   Eyes: Negative.   Respiratory: Positive for wheezing.        History of asthma  Cardiovascular: Negative.   Gastrointestinal: Negative.   Endocrine: Negative.   Genitourinary: Negative.   Musculoskeletal: Negative.   Allergic/Immunologic: Negative.   Neurological: Negative.   Psychiatric/Behavioral: Positive for sleep disturbance.    Vitals:   03/19/18 1106  BP: 110/82  Pulse: 83  SpO2: 96%     Physical Exam    Constitutional: She is oriented to person, place, and time. She appears well-developed and well-nourished.  Obese  HENT:  Head: Normocephalic and atraumatic.  Mallampati 4  Eyes: Pupils are equal, round, and reactive to light. Conjunctivae and EOM are normal. Right eye exhibits no discharge. Left eye exhibits no  discharge.  Neck: Normal range of motion. Neck supple. No tracheal deviation present. No thyromegaly present.  Cardiovascular: Normal rate and regular rhythm.  Pulmonary/Chest: Effort normal and breath sounds normal. No respiratory distress. She has no wheezes.  Abdominal: Soft. Bowel sounds are normal. She exhibits no distension. There is no tenderness.  Musculoskeletal: Normal range of motion. She exhibits no edema.  Neurological: She is alert and oriented to person, place, and time. She has normal reflexes. No cranial nerve deficit.  Skin: Skin is warm and dry. She is not diaphoretic. No erythema.  Psychiatric: She has a normal mood and affect. Her behavior is normal.    Data Reviewed: Records reviewed  Assessment:   Chronic insomnia-secondary to other medical condition History of bipolar disorder History of PTSD  History of snoring  A lot of psychoactive medications will have an effect on her sleep  Plan/Recommendations:  Moderate probability of significant sleep disordered breathing We will request for home sleep study-she feels she will not be able to sleep in a lab Optimize other treatments for the PTSD/bipolar disorder Not inclined to add any medications at present as she has had multiple issues with medications in the past  If sleep study is negative, sedating antidepressant may be considered Trazodone may be considered Temazepam may be an option  She did not tolerate use of Ambien in the past  Continue to optimize treatment for asthma  I will see her back in the office in about 8 weeks following initiation of treatment   Sherrilyn Rist MD [facility] 03/19/2018, 11:29 AM  CC: Lucretia Kern, DO

## 2018-03-26 ENCOUNTER — Encounter: Payer: Self-pay | Admitting: Gastroenterology

## 2018-04-09 ENCOUNTER — Ambulatory Visit (AMBULATORY_SURGERY_CENTER): Payer: PPO | Admitting: Gastroenterology

## 2018-04-09 ENCOUNTER — Encounter: Payer: Self-pay | Admitting: Gastroenterology

## 2018-04-09 VITALS — BP 120/80 | HR 84 | Temp 98.6°F | Resp 11 | Ht 61.0 in | Wt 173.0 lb

## 2018-04-09 DIAGNOSIS — K5730 Diverticulosis of large intestine without perforation or abscess without bleeding: Secondary | ICD-10-CM | POA: Diagnosis not present

## 2018-04-09 DIAGNOSIS — R109 Unspecified abdominal pain: Secondary | ICD-10-CM

## 2018-04-09 DIAGNOSIS — K2970 Gastritis, unspecified, without bleeding: Secondary | ICD-10-CM | POA: Diagnosis not present

## 2018-04-09 DIAGNOSIS — Z1211 Encounter for screening for malignant neoplasm of colon: Secondary | ICD-10-CM | POA: Diagnosis not present

## 2018-04-09 DIAGNOSIS — R197 Diarrhea, unspecified: Secondary | ICD-10-CM

## 2018-04-09 DIAGNOSIS — K2950 Unspecified chronic gastritis without bleeding: Secondary | ICD-10-CM | POA: Diagnosis not present

## 2018-04-09 DIAGNOSIS — K219 Gastro-esophageal reflux disease without esophagitis: Secondary | ICD-10-CM | POA: Diagnosis not present

## 2018-04-09 MED ORDER — SODIUM CHLORIDE 0.9 % IV SOLN: 500.0000 mL | Freq: Once | INTRAVENOUS | Status: DC

## 2018-04-09 NOTE — Op Note (Signed)
[facility] Patient Name: Brittany [name] Procedure Date: 04/09/2018 1:56 PM MRN: [id number] Endoscopist: Thornton Park MD, MD Age: 62 Referring MD:  Date of Birth: 01-09-1956 Gender: Female Account #: [id number] Procedure:                Colonoscopy Indications:              Chronic diarrhea Medicines:                See the Anesthesia note for documentation of the                            administered medications Procedure:                Pre-Anesthesia Assessment:                           - Prior to the procedure, a History and Physical                            was performed, and patient medications and                            allergies were reviewed. The patient's tolerance of                            previous anesthesia was also reviewed. The risks                            and benefits of the procedure and the sedation                            options and risks were discussed with the patient.                            All questions were answered, and informed consent                            was obtained. Prior Anticoagulants: The patient has                            taken no previous anticoagulant or antiplatelet                            agents. ASA Grade Assessment: II - A patient with                            mild systemic disease. After reviewing the risks                            and benefits, the patient was deemed in                            satisfactory condition to undergo the procedure.  After obtaining informed consent, the colonoscope                            was passed under direct vision. Throughout the                            procedure, the patient's blood pressure, pulse, and                            oxygen saturations were monitored continuously. The                            Colonoscope was introduced through the anus and                            advanced to the the terminal ileum, with                             identification of the appendiceal orifice and IC                            valve. The colonoscopy was performed without                            difficulty. The patient tolerated the procedure                            well. The quality of the bowel preparation was good. Scope In: 2:10:33 PM Scope Out: 2:26:56 PM Scope Withdrawal Time: 0 hours 13 minutes 43 seconds  Total Procedure Duration: 0 hours 16 minutes 23 seconds  Findings:                 The perianal and digital rectal examinations were                            normal.                           Multiple small and large-mouthed diverticula were                            found in the entire colon.                           The colon (entire examined portion) appeared                            normal. Biopsies were taken with a cold forceps for                            histology. There was mucosal deformity associated                            with each biopsy. There was an area of scope trauma  in the ascending colon that resulted from attempts                            to intubate the ileocecal valve. Complications:            No immediate complications. Estimated Blood Loss:     Estimated blood loss: minimal. Impression:               - Diverticulosis in the entire examined colon.                           - The examination was otherwise normal on direct                            and retroflexion views.                           - No specimens collected. Recommendation:           - Discharge patient to home.                           - Resume previous diet.                           - Continue present medications.                           - Await pathology results.                           - Repeat colonoscopy in 10 years for screening                            purposes.                           - Return to GI office in 4 weeks. Thornton Park MD,  MD 04/09/2018 2:37:15 PM This report has been signed electronically.

## 2018-04-09 NOTE — Progress Notes (Signed)
Called to room to assist during endoscopic procedure.  Patient ID and intended procedure confirmed with present staff. Received instructions for my participation in the procedure from the performing physician.  

## 2018-04-09 NOTE — Progress Notes (Signed)
Pt's states no medical or surgical changes since previsit or office visit. 

## 2018-04-09 NOTE — Op Note (Signed)
[facility] Patient Name: Brittany [name] Procedure Date: 04/09/2018 1:56 PM MRN: [id number] Endoscopist: Thornton Park MD, MD Age: 63 Referring MD:  Date of Birth: 06-25-1955 Gender: Female Account #: [id number] Procedure:                Upper GI endoscopy Indications:              Generalized abdominal pain, diarrhea Medicines:                See the Anesthesia note for documentation of the                            administered medications Procedure:                Pre-Anesthesia Assessment:                           - Prior to the procedure, a History and Physical                            was performed, and patient medications and                            allergies were reviewed. The patient's tolerance of                            previous anesthesia was also reviewed. The risks                            and benefits of the procedure and the sedation                            options and risks were discussed with the patient.                            All questions were answered, and informed consent                            was obtained. Prior Anticoagulants: The patient has                            taken no previous anticoagulant or antiplatelet                            agents. ASA Grade Assessment: II - A patient with                            mild systemic disease. After reviewing the risks                            and benefits, the patient was deemed in                            satisfactory condition to undergo the procedure.  After obtaining informed consent, the endoscope was                            passed under direct vision. Throughout the                            procedure, the patient's blood pressure, pulse, and                            oxygen saturations were monitored continuously. The                            Endoscope was introduced through the mouth, and                            advanced to the  second part of duodenum. The upper                            GI endoscopy was accomplished without difficulty.                            The patient tolerated the procedure well. Scope In: Scope Out: Findings:                 The esophagus was normal.                           The entire examined stomach was normal. Biopsies                            were taken with a cold forceps for histology.                           The examined duodenum was normal. Biopsies were                            taken with a cold forceps for histology. Complications:            No immediate complications. Estimated Blood Loss:     Estimated blood loss: none. Impression:               - Normal esophagus.                           - Normal stomach. Biopsied.                           - Normal examined duodenum. Biopsied. Recommendation:           - Await pathology results.                           -Proceed with colonoscopy as previously planned Thornton Park MD, MD 04/09/2018 2:33:08 PM This report has been signed electronically.

## 2018-04-09 NOTE — Progress Notes (Signed)
A and O x3. Report to RN. Tolerated MAC anesthesia well.

## 2018-04-09 NOTE — Patient Instructions (Signed)
YOU HAD AN ENDOSCOPIC PROCEDURE TODAY AT [facility]:   Refer to the procedure report that was given to you for any specific questions about what was found during the examination.  If the procedure report does not answer your questions, please call your gastroenterologist to clarify.  If you requested that your care partner not be given the details of your procedure findings, then the procedure report has been included in a sealed envelope for you to review at your convenience later.  YOU SHOULD EXPECT: Some feelings of bloating in the abdomen. Passage of more gas than usual.  Walking can help get rid of the air that was put into your GI tract during the procedure and reduce the bloating. If you had a lower endoscopy (such as a colonoscopy or flexible sigmoidoscopy) you may notice spotting of blood in your stool or on the toilet paper. If you underwent a bowel prep for your procedure, you may not have a normal bowel movement for a few days.  Please Note:  You might notice some irritation and congestion in your nose or some drainage.  This is from the oxygen used during your procedure.  There is no need for concern and it should clear up in a day or so.  SYMPTOMS TO REPORT IMMEDIATELY:   Following lower endoscopy (colonoscopy or flexible sigmoidoscopy):  Excessive amounts of blood in the stool  Significant tenderness or worsening of abdominal pains  Swelling of the abdomen that is new, acute  Fever of 100F or higher   Following upper endoscopy (EGD)  Vomiting of blood or coffee ground material  New chest pain or pain under the shoulder blades  Painful or persistently difficult swallowing  New shortness of breath  Fever of 100F or higher  Black, tarry-looking stools  For urgent or emergent issues, a gastroenterologist can be reached at any hour by calling [phone].   DIET:  We do recommend a small meal at first, but then you may proceed to your regular diet.  Drink  plenty of fluids but you should avoid alcoholic beverages for 24 hours.  ACTIVITY:  You should plan to take it easy for the rest of today and you should NOT DRIVE or use heavy machinery until tomorrow (because of the sedation medicines used during the test).    FOLLOW UP: Our staff will call the number listed on your records the next business day following your procedure to check on you and address any questions or concerns that you may have regarding the information given to you following your procedure. If we do not reach you, we will leave a message.  However, if you are feeling well and you are not experiencing any problems, there is no need to return our call.  We will assume that you have returned to your regular daily activities without incident.  If any biopsies were taken you will be contacted by phone or by letter within the next 1-3 weeks.  Please call us at [phone] if you have not heard about the biopsies in 3 weeks.    SIGNATURES/CONFIDENTIALITY: You and/or your care partner have signed paperwork which will be entered into your electronic medical record.  These signatures attest to the fact that that the information above on your After Visit Summary has been reviewed and is understood.  Full responsibility of the confidentiality of this discharge information lies with you and/or your care-partner.  Diverticulosis imformation given.  Recall colonoscopy 10 years-2029

## 2018-04-10 ENCOUNTER — Telehealth: Payer: Self-pay

## 2018-04-10 NOTE — Telephone Encounter (Signed)
  Follow up Call-  Call back number 04/09/2018  Post procedure Call Back phone  # [phone]  Permission to leave phone message Yes  Some recent data might be hidden     Patient questions:  Do you have a fever, pain , or abdominal swelling? No. Pain Score  0 *  Have you tolerated food without any problems? Yes.    Have you been able to return to your normal activities? Yes.    Do you have any questions about your discharge instructions: Diet   No. Medications  No. Follow up visit  No.  Do you have questions or concerns about your Care? No.  Actions: * If pain score is 4 or above: No action needed, pain <4.

## 2018-04-14 ENCOUNTER — Encounter: Payer: Self-pay | Admitting: Gastroenterology

## 2018-04-15 ENCOUNTER — Other Ambulatory Visit: Payer: Self-pay | Admitting: Psychiatry

## 2018-04-17 ENCOUNTER — Telehealth: Payer: Self-pay | Admitting: Gastroenterology

## 2018-04-17 NOTE — Telephone Encounter (Signed)
Let patient know that a letter was mailed out on Monday, 10/28 with normal results. Patient is still having problems, when she "gets upset, feels like her stomach is on fire". She was scheduled for later in November, rescheduled her to 11/6.

## 2018-04-22 NOTE — Progress Notes (Signed)
Referring Provider: Lucretia Kern, DO Primary Care Physician:  Lucretia Kern, DO   Reason for Consultation: Diarrhea   IMPRESSION:  Chronic diarrhea    - started 16 years ago at the time of cholecystectomy    - intermittent for many years    - progressively worse over the last 6 months    - now having 5-7 BM daily    - no alarm features    -Normal duodenal and colon biopsies 04/09/2018    - using Immodium PRN.     - no improvement on probiotics Abdominal pain and bloating    - improved with diet changes: avoiding caffeine, sugar, flour, dairy    - break through symptoms improved with Xanax    - prior diagnosis of IBS by Dr. Collene Mares in 2009, treated with Align    - Biopsies negative for H. 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 Collene Mares 2009  Cholecystectomy for cholelithiasis 16 years ago  Diarrhea persists without alarm features. Reviewed recent endoscopy results and reassuring pathology results, particularly reassuring her that the duodenal biopsies exclude celiac disease.    Suspected post-cholecystectomy diarrhea. Will attempt a treatment trial prior to evaluation for more unusual causes of diarrhea. Diarrhea-predominant IBS is also a possibility.  I recommended that she avoid dairy in the event that lactose intolerance is contributing.  PLAN: Avoid dairy Cholestipol 2 grams daily x one week. Then increase to 2 grams twice daily.  Return to clinic in 3 months or earlier as needed     HPI: Brittany [name] is a 62 y.o. female returns in scheduled follow-up after her initial consultation for diarrhea 03/10/18 and endoscopy 04/09/2018  Upper endoscopy and colonoscopy 04/09/2018  revealed pancolonic diverticulosis and was otherwise normal. Gastric biopsy showed chronic inactive gastritis with no evidence of H. pylori.   Biopsies from the duodenum and colon were normal  She is not doing well. Fighting a cold. "Stomach" is bothering her more than normal. She has been having 10-15 BM daily. Abdominal pain continues to be diffuse, can be severe, feels like stabbing or punching. Improved with defecation. Taking Imodium PRN when she needs to leave the house. Probiotics provided no relief. She is bothered that they persist and that there is still no answer despite her upper endoscopy and colonoscopy.  She is limiting her diet bread, baked potatoes, crackers, otherwise she feels gassy. Also having abdominal pain when her cat kneads her abdomen. No other associated symptoms. No identified exacerbating or relieving features.     Past Medical History:  Diagnosis Date  . Abnormal EKG   . Anxiety   . Asthma   . Bipolar depression ([place]), Anxiety, PTSD, Panic disorder    -managed by [facility]  . Depression   . Foot pain   . GERD (gastroesophageal reflux disease)   . Insomnia   . Leg swelling   . Osteoporosis   . Tachycardia     Past Surgical History:  Procedure Laterality Date  . CARPAL TUNNEL RELEASE Left   . CESAREAN SECTION     x 2  . CHOLECYSTECTOMY    . ENDOMETRIAL ABLATION    . KNEE SURGERY Right   . TUBAL LIGATION    . ULNAR NERVE REPAIR Left     Current Outpatient Medications  Medication Sig Dispense Refill  . Albuterol Sulfate (PROAIR RESPICLICK) 073 (  90 Base) MCG/ACT AEPB Inhale 2 puffs into the lungs every 6 (six) hours as needed. 1 each 0  . ALPRAZolam (XANAX) 0.5 MG tablet Take 0.25-0.5 mg by mouth as needed for anxiety.     . busPIRone (BUSPAR) 15 MG tablet Take 15 mg by mouth 2 (two) times daily.     . Cholecalciferol (VITAMIN D-3 PO) Take 1 tablet by mouth daily.    Marland Kitchen doxazosin (CARDURA) 4 MG tablet Take 6 mg by mouth daily.    . fluticasone (FLONASE) 50 MCG/ACT nasal spray Place 2 sprays into both nostrils daily. 16 g 6  . lithium 300 MG tablet Take 600 mg by mouth daily.    Marland Kitchen  lithium carbonate 150 MG capsule Take 150 mg by mouth daily.   1  . lithium carbonate 300 MG capsule TAKE 2 CAPSULES BY MOUTH AT BEDTIME 180 capsule 1  . Melatonin 3 MG TABS Take 1 tablet by mouth at bedtime.    . montelukast (SINGULAIR) 10 MG tablet Take 1 tablet (10 mg total) by mouth at bedtime. 90 tablet 1  . Multiple Vitamins-Minerals (MULTIVITAMIN WITH MINERALS) tablet Take 1 tablet by mouth daily.    Marland Kitchen triamcinolone cream (KENALOG) 0.1 % Apply 1 application topically 2 (two) times daily. For itchy patch on R hand 30 g 0  . Ziprasidone HCl (GEODON PO) Take 300 mg by mouth 2 (two) times daily.     No current facility-administered medications for this visit.     Allergies as of 04/23/2018 - Review Complete 04/09/2018  Allergen Reaction Noted  . Aspirin Other (See Comments)   . Codeine Itching 09/26/2017  . Sulfamethoxazole-trimethoprim Other (See Comments)   . Trimethoprim Other (See Comments) 11/12/2014  . Ultram [tramadol] Itching 09/26/2017  . Vioxx [rofecoxib]      Family History  Problem Relation Age of Onset  . Heart failure Mother   . Hypertension Mother   . Hyperlipidemia Mother   . COPD Paternal Aunt        alot of aunts and uncle COPD or Emphysema  . Emphysema Paternal Uncle   . Breast cancer Neg Hx     Social History   Socioeconomic History  . Marital status: Single    Spouse name: Not on file  . Number of children: 3  . Years of education: Not on file  . Highest education level: Not on file  Occupational History  . Occupation: disabled  Social Needs  . Financial resource strain: Not on file  . Food insecurity:    Worry: Not on file    Inability: Not on file  . Transportation needs:    Medical: Not on file    Non-medical: Not on file  Tobacco Use  . Smoking status: Former Smoker    Packs/day: 20.00    Years: 1.00    Pack years: 20.00    Last attempt to quit: 06/19/1999    Years since quitting: 18.8  . Smokeless tobacco: Never Used  . Tobacco  comment: 20 year estimate but probably less   Substance and Sexual Activity  . Alcohol use: Yes    Alcohol/week: 0.0 standard drinks    Comment: Occass.   . Drug use: No  . Sexual activity: Never  Lifestyle  . Physical activity:    Days per week: Not on file    Minutes per session: Not on file  . Stress: Not on file  Relationships  . Social connections:    Talks on phone: Not on  file    Gets together: Not on file    Attends religious service: Not on file    Active member of club or organization: Not on file    Attends meetings of clubs or organizations: Not on file    Relationship status: Not on file  . Intimate partner violence:    Fear of current or ex partner: Not on file    Emotionally abused: Not on file    Physically abused: Not on file    Forced sexual activity: Not on file  Other Topics Concern  . Not on file  Social History Narrative   Work or School: Disabled 2ndary to psychiatric conditions      Home Situation: lives alone with 2 cats and one dog      Spiritual Beliefs: Christian      Lifestyle: no regular exercise, diet  Not great - wants to embark on healthier lifestyle    Review of Systems: 12 system ROS is negative except as noted above.   Physical Exam: Vital signs were reviewed. Weight:  03/10/18 181 lb 8 oz (82.3 kg)  01/27/18 185 lb 4.8 oz (84.1 kg)  11/19/17 181 lb 3 oz (82.2 kg)  Her weight today is 186 pounds.    General:   Alert, well-nourished, pleasant and cooperative in NAD Lungs:  Clear throughout to auscultation.   No wheezes.  Heart:  Regular rate and rhythm; no murmurs Abdomen:  Soft, mild diffuse tenderness, normal bowel sounds. No rebound or guarding. No hepatosplenomegaly Rectal:  Deferred  Skin:  No rash or bruise. Psych:  Alert and cooperative. Normal mood and affect.   Kesa Birky L. Tarri Glenn Md, MPH [facility] 04/22/2018, 12:37 PM

## 2018-04-23 ENCOUNTER — Encounter: Payer: Self-pay | Admitting: Family Medicine

## 2018-04-23 ENCOUNTER — Ambulatory Visit (INDEPENDENT_AMBULATORY_CARE_PROVIDER_SITE_OTHER): Payer: PPO | Admitting: Gastroenterology

## 2018-04-23 ENCOUNTER — Encounter: Payer: Self-pay | Admitting: Gastroenterology

## 2018-04-23 ENCOUNTER — Ambulatory Visit (INDEPENDENT_AMBULATORY_CARE_PROVIDER_SITE_OTHER): Payer: PPO | Admitting: Family Medicine

## 2018-04-23 VITALS — BP 134/80 | HR 88 | Ht 61.5 in | Wt 186.0 lb

## 2018-04-23 VITALS — BP 130/84 | HR 93 | Temp 98.6°F | Resp 12 | Ht 61.5 in | Wt 185.2 lb

## 2018-04-23 DIAGNOSIS — R05 Cough: Secondary | ICD-10-CM | POA: Diagnosis not present

## 2018-04-23 DIAGNOSIS — R197 Diarrhea, unspecified: Secondary | ICD-10-CM | POA: Diagnosis not present

## 2018-04-23 DIAGNOSIS — J069 Acute upper respiratory infection, unspecified: Secondary | ICD-10-CM

## 2018-04-23 DIAGNOSIS — J45901 Unspecified asthma with (acute) exacerbation: Secondary | ICD-10-CM

## 2018-04-23 DIAGNOSIS — R059 Cough, unspecified: Secondary | ICD-10-CM

## 2018-04-23 LAB — POCT INFLUENZA A/B
INFLUENZA A, POC: NEGATIVE
INFLUENZA B, POC: NEGATIVE

## 2018-04-23 MED ORDER — COLESTIPOL HCL 1 G PO TABS: ORAL_TABLET | ORAL | 3 refills | Status: DC

## 2018-04-23 MED ORDER — METHYLPREDNISOLONE ACETATE 40 MG/ML IJ SUSP
40.0000 mg | Freq: Once | INTRAMUSCULAR | Status: AC
  Administered 2018-04-23: 40 mg via INTRAMUSCULAR

## 2018-04-23 MED ORDER — BENZONATATE 100 MG PO CAPS: 200.0000 mg | ORAL_CAPSULE | Freq: Three times a day (TID) | ORAL | 0 refills | Status: AC | PRN

## 2018-04-23 MED ORDER — ALBUTEROL SULFATE 108 (90 BASE) MCG/ACT IN AEPB: 2.0000 | INHALATION_SPRAY | Freq: Four times a day (QID) | RESPIRATORY_TRACT | 1 refills | Status: DC | PRN

## 2018-04-23 NOTE — Patient Instructions (Addendum)
  [name] I have seen you today for an acute visit.  A few things to remember from today's visit:   Mild asthma with exacerbation, unspecified whether persistent - Plan: Albuterol Sulfate (PROAIR RESPICLICK) 281 (90 Base) MCG/ACT AEPB  URI, acute  Cough - Plan: benzonatate (TESSALON) 100 MG capsule   If medications prescribed today, they will not be refill upon request, a follow up appointment with PCP will be necessary to discuss continuation of of treatment if appropriate.   Albuterol inh 2 puff every 6 hours for a week then as needed for wheezing or shortness of breath.    In general please monitor for signs of worsening symptoms and seek immediate medical attention if any concerning.  If symptoms are not resolved in 1-2 weeks you should schedule a follow up appointment with your doctor, before if needed.  I hope you get better soon!

## 2018-04-23 NOTE — Patient Instructions (Addendum)
We have sent the following medications to your pharmacy for you to pick up at your convenience: Colestipol 2 grams daily x 1 week, then increase to 2 grams twice daily thereafter  Please follow up with Dr Tarri Glenn in 3 months, sooner if needed.  If you are age 62 or older, your body mass index should be between 23-30. Your Body mass index is 34.58 kg/m. If this is out of the aforementioned range listed, please consider follow up with your Primary Care Provider.  If you are age 20 or younger, your body mass index should be between 19-25. Your Body mass index is 34.58 kg/m. If this is out of the aformentioned range listed, please consider follow up with your Primary Care Provider.

## 2018-04-23 NOTE — Progress Notes (Signed)
ACUTE VISIT  HPI:  Chief Complaint  Patient presents with  . Cough    sx started Monday  . Nasal Congestion    Brittany [name] is a 62 y.o.female here today complaining of 2-3 days of respiratory symptoms.  Known productive cough, worse at night when lying down. Low grade fever, 99 F. She has had some chills and body aches/fatigue.  "Little" wheezing, no dyspnea or chest pain.  Symptoms exacerbated by exertion and alleviated by rest.  She has history of asthma, she ran out of albuterol inhaler. She denies sore throat but reports some "scratchy" throat. She denies dysphasia or stridor.   Cough  This is a new problem. The current episode started in the past 7 days. The problem has been gradually worsening. The problem occurs every few minutes. The cough is non-productive. Associated symptoms include chills, a fever, myalgias, nasal congestion, postnasal drip, rhinorrhea, a sore throat and wheezing. Pertinent negatives include no ear pain, eye redness, headaches, hemoptysis, rash or shortness of breath. The symptoms are aggravated by exercise. Risk factors for lung disease include smoking/tobacco exposure. She has tried OTC cough suppressant for the symptoms. Her past medical history is significant for asthma, COPD and environmental allergies. There is no history of pneumonia.    No Hx of recent travel. No sick contact. No known insect bite.  Hx of allergies: History of asthma. Former smoker.  OTC medications for this problem: Ibuprofen and Mucinex.    Review of Systems  Constitutional: Positive for activity change, appetite change, chills, fatigue and fever.  HENT: Positive for congestion, postnasal drip, rhinorrhea, sinus pressure and sore throat. Negative for ear pain, mouth sores, trouble swallowing and voice change.   Eyes: Negative for discharge and redness.  Respiratory: Positive for cough and wheezing. Negative for hemoptysis and shortness of  breath.   Gastrointestinal: Negative for abdominal pain, diarrhea, nausea and vomiting.  Musculoskeletal: Positive for myalgias. Negative for gait problem and neck pain.  Skin: Negative for rash.  Allergic/Immunologic: Positive for environmental allergies.  Neurological: Negative for weakness and headaches.  Hematological: Negative for adenopathy. Does not bruise/bleed easily.      Current Outpatient Medications on File Prior to Visit  Medication Sig Dispense Refill  . ALPRAZolam (XANAX) 0.5 MG tablet Take 0.25-0.5 mg by mouth as needed for anxiety.     . busPIRone (BUSPAR) 15 MG tablet Take 15 mg by mouth 2 (two) times daily.     . Cholecalciferol (VITAMIN D-3 PO) Take 1 tablet by mouth daily.    . colestipol (COLESTID) 1 g tablet Take 2 tablets by mouth daily x 1 week, then increase to 2 tablets by mouth twice daily thereafter. 120 tablet 3  . doxazosin (CARDURA) 4 MG tablet Take 6 mg by mouth daily.    . fluticasone (FLONASE) 50 MCG/ACT nasal spray Place 2 sprays into both nostrils daily. 16 g 6  . gabapentin (NEURONTIN) 300 MG capsule Take 300 mg by mouth 2 (two) times daily as needed.    . lithium 300 MG tablet Take 600 mg by mouth daily.    Marland Kitchen lithium carbonate 150 MG capsule Take 150 mg by mouth daily.   1  . Melatonin 3 MG TABS Take 1 tablet by mouth at bedtime.    . montelukast (SINGULAIR) 10 MG tablet Take 1 tablet (10 mg total) by mouth at bedtime. 90 tablet 1  . Multiple Vitamins-Minerals (MULTIVITAMIN WITH MINERALS) tablet Take 1 tablet by mouth daily.    Marland Kitchen  triamcinolone cream (KENALOG) 0.1 % Apply 1 application topically 2 (two) times daily. For itchy patch on R hand 30 g 0   No current facility-administered medications on file prior to visit.      Past Medical History:  Diagnosis Date  . Abnormal EKG   . Anxiety   . Asthma   . Bipolar depression ([place]), Anxiety, PTSD, Panic disorder    -managed by [facility]  . Depression   . Foot pain   . GERD  (gastroesophageal reflux disease)   . Heart murmur    as a child  . Insomnia   . Leg swelling   . Osteoporosis   . Tachycardia    Allergies  Allergen Reactions  . Aspirin Other (See Comments)    discomfort  . Codeine Itching  . Sulfamethoxazole-Trimethoprim Other (See Comments)    Unknown reaction per pt  . Trimethoprim Other (See Comments)    Unknown, per pt  . Ultram [Tramadol] Itching  . Vioxx [Rofecoxib]     Unknown per pt    Social History   Socioeconomic History  . Marital status: Single    Spouse name: Not on file  . Number of children: 3  . Years of education: Not on file  . Highest education level: Not on file  Occupational History  . Occupation: disabled  Social Needs  . Financial resource strain: Not on file  . Food insecurity:    Worry: Not on file    Inability: Not on file  . Transportation needs:    Medical: Not on file    Non-medical: Not on file  Tobacco Use  . Smoking status: Former Smoker    Packs/day: 20.00    Years: 1.00    Pack years: 20.00    Last attempt to quit: 06/19/1999    Years since quitting: 18.8  . Smokeless tobacco: Never Used  . Tobacco comment: 20 year estimate but probably less   Substance and Sexual Activity  . Alcohol use: Yes    Alcohol/week: 0.0 standard drinks    Comment: Occass.   . Drug use: No  . Sexual activity: Never  Lifestyle  . Physical activity:    Days per week: Not on file    Minutes per session: Not on file  . Stress: Not on file  Relationships  . Social connections:    Talks on phone: Not on file    Gets together: Not on file    Attends religious service: Not on file    Active member of club or organization: Not on file    Attends meetings of clubs or organizations: Not on file    Relationship status: Not on file  Other Topics Concern  . Not on file  Social History Narrative   Work or School: Disabled 2ndary to psychiatric conditions      Home Situation: lives alone with 2 cats and one dog        Spiritual Beliefs: Christian      Lifestyle: no regular exercise, diet  Not great - wants to embark on healthier lifestyle    Vitals:   04/23/18 1110  BP: 130/84  Pulse: 93  Resp: 12  Temp: 98.6 F (37 C)  SpO2: 96%   Body mass index is 34.44 kg/m.   Physical Exam  Nursing note and vitals reviewed. Constitutional: She is oriented to person, place, and time. She appears well-developed. She does not appear ill. No distress.  HENT:  Head: Normocephalic and atraumatic.  Right  Ear: Tympanic membrane, external ear and ear canal normal.  Left Ear: Tympanic membrane, external ear and ear canal normal.  Nose: Rhinorrhea present. Right sinus exhibits no maxillary sinus tenderness and no frontal sinus tenderness. Left sinus exhibits no maxillary sinus tenderness and no frontal sinus tenderness.  Mouth/Throat: Oropharynx is clear and moist and mucous membranes are normal.  Postnasal drainage.  Eyes: Conjunctivae are normal.  Neck: No edema and no erythema present.  Cardiovascular: Normal rate and regular rhythm.  No murmur heard. Respiratory: Effort normal. No stridor. No respiratory distress. She has wheezes. She has no rhonchi. She has no rales.  Dry cough a few times during evaluation.  Lymphadenopathy:       Head (right side): No submandibular adenopathy present.       Head (left side): No submandibular adenopathy present.    She has no cervical adenopathy.  Neurological: She is alert and oriented to person, place, and time. She has normal strength.  Skin: Skin is warm. No rash noted. No erythema.  Psychiatric: She has a normal mood and affect.  Well groomed, good eye contact.      ASSESSMENT AND PLAN:  Ms. Melayah was seen today for cough and nasal congestion.  Diagnoses and all orders for this visit:  Mild asthma with exacerbation, unspecified whether persistent Lung auscultation after DuoNeb breathing treatment negative for rales or rhonchi, still mild wheezing but  improved. She is now interested in prednisone or any other oral steroid because she has had some side effects in the past. She would like to have a steroid shot instead. Albuterol inh 2 puff every 6 hours for a week then as needed for wheezing or shortness of breath.   She was instructed about warning signs. Follow-up with PCP in 1 to 2 weeks if symptoms are not greatly improved.   -     Albuterol Sulfate (PROAIR RESPICLICK) 557 (90 Base) MCG/ACT AEPB; Inhale 2 puffs into the lungs every 6 (six) hours as needed. -     POC Influenza A/B -     methylPREDNISolone acetate (DEPO-MEDROL) injection 40 mg  URI, acute  Rapid flu here in the office negative. Most likely viral etiology, so symptomatic treatment recommended. Plenty of p.o. fluids and rest. Monitor for signs of complications. Follow-up with PCP as needed.  -     POC Influenza A/B -     methylPREDNISolone acetate (DEPO-MEDROL) injection 40 mg  Cough  Explained that cough can last a few days and even weeks. I do not think imaging is needed today. Follow-up with PCP as needed.  -     benzonatate (TESSALON) 100 MG capsule; Take 2 capsules (200 mg total) by mouth 3 (three) times daily as needed for up to 10 days. -     POC Influenza A/B -     methylPREDNISolone acetate (DEPO-MEDROL) injection 40 mg      Obrian Bulson G. [place], MD  [facility]. [place] office.

## 2018-04-27 ENCOUNTER — Encounter: Payer: Self-pay | Admitting: Emergency Medicine

## 2018-04-28 NOTE — Progress Notes (Signed)
HPI:  Using dictation device. Unfortunately this device frequently misinterprets words/phrases.  Brittany [name] is a pleasant 62 y.o. here for follow up. Chronic medical problems summarized below were reviewed for changes and stability and were updated as needed below. These issues and their treatment remain stable for the most part.  She saw Brittany [name] recently for an upper respiratory illness with a asthma flare.  Reports she is doing much better overall with reduced cough, reduced wheezing and less shortness of breath.  Reports her energy has improved the last 2 days.  However, she does still have some coughing and wheezing at times would like a refill on her albuterol.  Reports she was out of it at the time of her appointment, but did not call to get a refill. Denies CP, SOB, DOE, treatment intolerance or new symptoms.  AWV 11/19/17  Obesity, hyperlipidemia, osteoarthritis: -reports 12/18 doing better with increased exercise and diet ok -psych meds have increased her weight -she is interested in healthy lifestyle  Osteopenia: -early menopause in early 9s -dexa done 08/2016 with osteopenia -reports her psychiatrist advised her to stop Vit D and check level, so currently not taking  Asthma/GERD: -seeing pulmonologist, Brittany [name] -meds: singulair, alb prn  Abdominal pain, diarrhea: -Seeing GI  Bipolar disorder/anxiety/panic disorder: -[name] for [facility] -on disability for psych issues -meds: xanax, buspar, lithium, carbamazepine , prozac, cardura - for night terrors, ambien  Inverted L nipple: -reports new for 1-2 yearsat 6/18 visit --> referred to breast center -she reports did eval, told normal and to do normal yearly mammograms -no pain, mass, discharge, skin lesion  ROS: See pertinent positives and negatives per HPI.  Past Medical History:  Diagnosis Date  . Abnormal EKG   . Anxiety   .  Asthma   . Bipolar depression ([place]), Anxiety, PTSD, Panic disorder    -managed by [facility]  . Depression   . Foot pain   . GERD (gastroesophageal reflux disease)   . Heart murmur    as a child  . Insomnia   . Leg swelling   . Osteoporosis   . Tachycardia     Past Surgical History:  Procedure Laterality Date  . CARPAL TUNNEL RELEASE Left   . CESAREAN SECTION     x 2  . CHOLECYSTECTOMY    . ENDOMETRIAL ABLATION    . KNEE SURGERY Right   . LAPAROSCOPY     x 2  . TUBAL LIGATION    . ULNAR NERVE REPAIR Left     Family History  Problem Relation Age of Onset  . Heart failure Mother   . Hypertension Mother   . Hyperlipidemia Mother   . COPD Paternal Aunt        alot of aunts and uncle COPD or Emphysema  . Emphysema Paternal Uncle   . Colon polyps Maternal Aunt   . Breast cancer Neg Hx   . Colon cancer Neg Hx   . Esophageal cancer Neg Hx   . Pancreatic cancer Neg Hx   . Stomach cancer Neg Hx   . Liver disease Neg Hx     SOCIAL HX: See HPI   Current Outpatient Medications:  .  Albuterol Sulfate (PROAIR RESPICLICK) 237 (90 Base) MCG/ACT AEPB, Inhale 2 puffs into the lungs every 6 (six) hours as needed., Disp: 1 each, Rfl: 1 .  ALPRAZolam (XANAX) 0.5 MG tablet, Take 0.25-0.5 mg by mouth as needed for anxiety. ,  Disp: , Rfl:  .  benzonatate (TESSALON) 100 MG capsule, Take 2 capsules (200 mg total) by mouth 3 (three) times daily as needed for up to 10 days., Disp: 40 capsule, Rfl: 0 .  busPIRone (BUSPAR) 15 MG tablet, Take 15 mg by mouth 2 (two) times daily. , Disp: , Rfl:  .  Cholecalciferol (VITAMIN D-3 PO), Take 1 tablet by mouth daily., Disp: , Rfl:  .  colestipol (COLESTID) 1 g tablet, Take 2 tablets by mouth daily x 1 week, then increase to 2 tablets by mouth twice daily thereafter., Disp: 120 tablet, Rfl: 3 .  doxazosin (CARDURA) 4 MG tablet, Take 6 mg by mouth daily., Disp: , Rfl:  .  fluticasone (FLONASE) 50 MCG/ACT nasal spray, Place 2 sprays into both  nostrils daily., Disp: 16 g, Rfl: 6 .  gabapentin (NEURONTIN) 300 MG capsule, Take 300 mg by mouth 2 (two) times daily as needed., Disp: , Rfl:  .  lithium 300 MG tablet, Take 600 mg by mouth daily., Disp: , Rfl:  .  lithium carbonate 150 MG capsule, Take 150 mg by mouth daily. , Disp: , Rfl: 1 .  Melatonin 3 MG TABS, Take 1 tablet by mouth at bedtime., Disp: , Rfl:  .  montelukast (SINGULAIR) 10 MG tablet, Take 1 tablet (10 mg total) by mouth at bedtime., Disp: 90 tablet, Rfl: 1 .  Multiple Vitamins-Minerals (MULTIVITAMIN WITH MINERALS) tablet, Take 1 tablet by mouth daily., Disp: , Rfl:  .  triamcinolone cream (KENALOG) 0.1 %, Apply 1 application topically 2 (two) times daily. For itchy patch on R hand, Disp: 30 g, Rfl: 0  EXAM:  Vitals:   04/29/18 1058  BP: 118/80  Pulse: 90  Temp: 99 F (37.2 C)  SpO2: 95%    Body mass index is 34.61 kg/m.  GENERAL: vitals reviewed and listed above, alert, oriented, appears well hydrated and in no acute distress  HEENT: atraumatic, conjunttiva clear, no obvious abnormalities on inspection of external nose and ears  NECK: no obvious masses on inspection  LUNGS: clear to auscultation bilaterally, rales or rhonchi, good air movement, scattered expiratory wheeze  CV: HRRR, no peripheral edema  MS: moves all extremities without noticeable abnormality  PSYCH: pleasant and cooperative, no obvious depression or anxiety  ASSESSMENT AND PLAN:  Discussed the following assessment and plan:  Cough variant asthma  vs UACS   Gastroesophageal reflux disease, esophagitis presence not specified  Hyperlipidemia, unspecified hyperlipidemia type  Obesity (BMI 30.0-34.9)  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  Patient Instructions  BEFORE YOU LEAVE: -check to see if back on Vit D3? Should be taking [phone] IU daily - update med list. -follow up: 3-4 months  Use the albuterol as needed. Follow up if worsening or if symptoms do not continue to improve.   We recommend the following healthy lifestyle for LIFE: 1) Small portions. But, make sure to get regular (at least 3 per day), healthy meals and small healthy snacks if needed.  2) Eat a healthy clean diet.   TRY TO EAT: -at least 5-7 servings  of low sugar, colorful, and nutrient rich vegetables per day (not corn, potatoes or bananas.) -berries are the best choice if you wish to eat fruit (only eat small amounts if trying to reduce weight)  -lean meets (fish, white meat of chicken or [place]) -vegan proteins for some meals - beans or tofu, whole grains, nuts and seeds -Replace bad fats with good fats - good fats include: fish, nuts and seeds, canola oil, olive oil -small amounts of low fat or non fat dairy -small amounts of100 % whole grains - check the lables -drink plenty of water  AVOID: -SUGAR, sweets, anything with added sugar, corn syrup or sweeteners - must read labels as even foods advertised as "healthy" often are loaded with sugar -if you must have a sweetener, small amounts of stevia may be best -sweetened beverages and artificially sweetened beverages -simple starches (rice, bread, potatoes, pasta, chips, etc - small amounts of 100% whole grains are ok) -red meat, pork, butter -fried foods, fast food, processed food, excessive dairy, eggs and coconut.  3)Get at least 150 minutes of sweaty aerobic exercise per week.  4)Reduce stress - consider counseling, meditation and relaxation to balance other  aspects of your life.     Lucretia Kern, DO

## 2018-04-29 ENCOUNTER — Ambulatory Visit (INDEPENDENT_AMBULATORY_CARE_PROVIDER_SITE_OTHER): Payer: PPO | Admitting: Family Medicine

## 2018-04-29 ENCOUNTER — Encounter: Payer: Self-pay | Admitting: Family Medicine

## 2018-04-29 VITALS — BP 118/80 | HR 90 | Temp 99.0°F | Ht 61.5 in | Wt 186.2 lb

## 2018-04-29 DIAGNOSIS — J45901 Unspecified asthma with (acute) exacerbation: Secondary | ICD-10-CM | POA: Diagnosis not present

## 2018-04-29 DIAGNOSIS — E785 Hyperlipidemia, unspecified: Secondary | ICD-10-CM | POA: Diagnosis not present

## 2018-04-29 DIAGNOSIS — K219 Gastro-esophageal reflux disease without esophagitis: Secondary | ICD-10-CM

## 2018-04-29 DIAGNOSIS — J449 Chronic obstructive pulmonary disease, unspecified: Secondary | ICD-10-CM

## 2018-04-29 DIAGNOSIS — E669 Obesity, unspecified: Secondary | ICD-10-CM | POA: Diagnosis not present

## 2018-04-29 DIAGNOSIS — J45991 Cough variant asthma: Secondary | ICD-10-CM | POA: Diagnosis not present

## 2018-04-29 MED ORDER — ALBUTEROL SULFATE 108 (90 BASE) MCG/ACT IN AEPB: 2.0000 | INHALATION_SPRAY | Freq: Four times a day (QID) | RESPIRATORY_TRACT | 1 refills | Status: DC | PRN

## 2018-04-29 NOTE — Patient Instructions (Addendum)
BEFORE YOU LEAVE: -check to see if back on Vit D3? Should be taking [phone] IU daily - update med list. -follow up: 3-4 months  Use the albuterol as needed. Follow up if worsening or if symptoms do not continue to improve.   We recommend the following healthy lifestyle for LIFE: 1) Small portions. But, make sure to get regular (at least 3 per day), healthy meals and small healthy snacks if needed.  2) Eat a healthy clean diet.   TRY TO EAT: -at least 5-7 servings of low sugar, colorful, and nutrient rich vegetables per day (not corn, potatoes or bananas.) -berries are the best choice if you wish to eat fruit (only eat small amounts if trying to reduce weight)  -lean meets (fish, white meat of chicken or [place]) -vegan proteins for some meals - beans or tofu, whole grains, nuts and seeds -Replace bad fats with good fats - good fats include: fish, nuts and seeds, canola oil, olive oil -small amounts of low fat or non fat dairy -small amounts of100 % whole grains - check the lables -drink plenty of water  AVOID: -SUGAR, sweets, anything with added sugar, corn syrup or sweeteners - must read labels as even foods advertised as "healthy" often are loaded with sugar -if you must have a sweetener, small amounts of stevia may be best -sweetened beverages and artificially sweetened beverages -simple starches (rice, bread, potatoes, pasta, chips, etc - small amounts of 100% whole grains are ok) -red meat, pork, butter -fried foods, fast food, processed food, excessive dairy, eggs and coconut.  3)Get at least 150 minutes of sweaty aerobic exercise per week.  4)Reduce stress - consider counseling, meditation and relaxation to balance other aspects of your life.

## 2018-05-02 ENCOUNTER — Encounter: Payer: Self-pay | Admitting: Psychiatry

## 2018-05-02 ENCOUNTER — Ambulatory Visit: Payer: PPO | Admitting: Psychiatry

## 2018-05-02 DIAGNOSIS — F4310 Post-traumatic stress disorder, unspecified: Secondary | ICD-10-CM

## 2018-05-02 DIAGNOSIS — F3162 Bipolar disorder, current episode mixed, moderate: Secondary | ICD-10-CM | POA: Diagnosis not present

## 2018-05-02 DIAGNOSIS — F4001 Agoraphobia with panic disorder: Secondary | ICD-10-CM

## 2018-05-02 MED ORDER — ALPRAZOLAM 0.5 MG PO TABS: 0.5000 mg | ORAL_TABLET | Freq: Two times a day (BID) | ORAL | 2 refills | Status: DC

## 2018-05-02 MED ORDER — DOXAZOSIN MESYLATE 4 MG PO TABS: 6.0000 mg | ORAL_TABLET | Freq: Every day | ORAL | 3 refills | Status: DC

## 2018-05-02 NOTE — Progress Notes (Signed)
Brittany [name] [id number] [birth year] 62 y.o.  Subjective:   Patient ID:  Brittany [name] is a 62 y.o. (DOB 08-Sep-1955) female.  Chief Complaint:  Chief Complaint  Patient presents with  . Anxiety  . Depression  . Follow-up    med changes    HPI Brittany [name] presents to the office today for follow-up of long-term depression and anxiety and insomnia.  She stopped the fluoxetine and carbamazepine.  At first noticed no changes, now feels better.  Less tired and not as depressed.  Apparently CBZ was making her sleepy and maybe down.  Couple NM lately.  Mind can race on negative things at night and then takes Xanax.  Sleep 3-6 hours at HS.  CC now is the anxiety and fear of going places and being in public.  No particular reason that she knows about.  There are a couple of women in her building that have been mean and brings back memories of being bullied as a kid.  Wants to increase the Xanax for anxiety.  2 panic attacks lately, one triggered at the gym over fear of asthma. The other was without reason.  Multiple med failures. Olanzapine, CBZ,  Review of Systems:  Review of Systems  Neurological: Negative for tremors and weakness.  Psychiatric/Behavioral: Positive for dysphoric mood. Negative for agitation, behavioral problems, confusion, decreased concentration, hallucinations, self-injury, sleep disturbance and suicidal ideas. The patient is nervous/anxious. The patient is not hyperactive.     Medications: I have reviewed the patient's current medications.  Current Outpatient Medications  Medication Sig Dispense Refill  . Albuterol Sulfate (PROAIR RESPICLICK) 102 (90 Base) MCG/ACT AEPB Inhale 2 puffs into the lungs every 6 (six) hours as needed. 1 each 1  . ALPRAZolam (XANAX) 0.5 MG tablet Take 1 tablet (0.5 mg total) by mouth 2 (two) times daily. 60 tablet 2  . busPIRone (BUSPAR) 15 MG tablet Take 15 mg by mouth 2 (two) times daily.     . Cholecalciferol  (VITAMIN D-3 PO) Take 5,000 Units by mouth daily.     . colestipol (COLESTID) 1 g tablet Take 2 tablets by mouth daily x 1 week, then increase to 2 tablets by mouth twice daily thereafter. 120 tablet 3  . doxazosin (CARDURA) 4 MG tablet Take 1.5 tablets (6 mg total) by mouth daily. 45 tablet 3  . fluticasone (FLONASE) 50 MCG/ACT nasal spray Place 2 sprays into both nostrils daily. 16 g 6  . gabapentin (NEURONTIN) 300 MG capsule Take 300 mg by mouth 2 (two) times daily as needed.    . lithium 300 MG tablet Take 600 mg by mouth daily.    Marland Kitchen lithium carbonate 150 MG capsule Take 150 mg by mouth daily.   1  . Melatonin 3 MG TABS Take 1 tablet by mouth at bedtime.    . montelukast (SINGULAIR) 10 MG tablet Take 1 tablet (10 mg total) by mouth at bedtime. 90 tablet 1  . Multiple Vitamins-Minerals (MULTIVITAMIN WITH MINERALS) tablet Take 1 tablet by mouth daily.    . benzonatate (TESSALON) 100 MG capsule Take 2 capsules (200 mg total) by mouth 3 (three) times daily as needed for up to 10 days. (Patient not taking: Reported on 05/02/2018) 40 capsule 0  . triamcinolone cream (KENALOG) 0.1 % Apply 1 application topically 2 (two) times daily. For itchy patch on R hand 30 g 0   No current facility-administered medications for this visit.     Medication Side Effects: None  Allergies:  Allergies  Allergen Reactions  . Aspirin Other (See Comments)    discomfort  . Codeine Itching  . Sulfamethoxazole-Trimethoprim Other (See Comments)    Unknown reaction per pt  . Trimethoprim Other (See Comments)    Unknown, per pt  . Ultram [Tramadol] Itching  . Vioxx [Rofecoxib]     Unknown per pt    Past Medical History:  Diagnosis Date  . Abnormal EKG   . Anxiety   . Asthma   . Bipolar depression ([place]), Anxiety, PTSD, Panic disorder    -managed by [facility]  . Depression   . Foot pain   . GERD (gastroesophageal reflux disease)   . Heart murmur    as a child  . Insomnia   . Leg swelling    . Osteoporosis   . Tachycardia     Family History  Problem Relation Age of Onset  . Heart failure Mother   . Hypertension Mother   . Hyperlipidemia Mother   . COPD Paternal Aunt        alot of aunts and uncle COPD or Emphysema  . Emphysema Paternal Uncle   . Colon polyps Maternal Aunt   . Breast cancer Neg Hx   . Colon cancer Neg Hx   . Esophageal cancer Neg Hx   . Pancreatic cancer Neg Hx   . Stomach cancer Neg Hx   . Liver disease Neg Hx     Social History   Socioeconomic History  . Marital status: Single    Spouse name: Not on file  . Number of children: 3  . Years of education: Not on file  . Highest education level: Not on file  Occupational History  . Occupation: disabled  Social Needs  . Financial resource strain: Not on file  . Food insecurity:    Worry: Not on file    Inability: Not on file  . Transportation needs:    Medical: Not on file    Non-medical: Not on file  Tobacco Use  . Smoking status: Former Smoker    Packs/day: 20.00    Years: 1.00    Pack years: 20.00    Last attempt to quit: 06/19/1999    Years since quitting: 18.8  . Smokeless tobacco: Never Used  . Tobacco comment: 20 year estimate but probably less   Substance and Sexual Activity  . Alcohol use: Yes    Alcohol/week: 0.0 standard drinks    Comment: Occass.   . Drug use: No  . Sexual activity: Never  Lifestyle  . Physical activity:    Days per week: Not on file    Minutes per session: Not on file  . Stress: Not on file  Relationships  . Social connections:    Talks on phone: Not on file    Gets together: Not on file    Attends religious service: Not on file    Active member of club or organization: Not on file    Attends meetings of clubs or organizations: Not on file    Relationship status: Not on file  . Intimate partner violence:    Fear of current or ex partner: Not on file    Emotionally abused: Not on file    Physically abused: Not on file    Forced sexual  activity: Not on file  Other Topics Concern  . Not on file  Social History Narrative   Work or School: Disabled 2ndary to psychiatric conditions      Home Situation: lives alone  with 2 cats and one dog      Spiritual Beliefs: Christian      Lifestyle: no regular exercise, diet  Not great - wants to embark on healthier lifestyle    Past Medical History, Surgical history, Social history, and Family history were reviewed and updated as appropriate.   Please see review of systems for further details on the patient's review from today.   Objective:   Physical Exam:  There were no vitals taken for this visit.  Physical Exam  Neurological: She displays no tremor. Gait normal.  Psychiatric: Her speech is normal and behavior is normal. Judgment normal. Her mood appears anxious. She is not actively hallucinating. Thought content is not paranoid. Cognition and memory are normal. She exhibits a depressed mood. She expresses no homicidal and no suicidal ideation.  Fair insight and judgment. ? Motivation for recovery. She is attentive.    Lab Review:     Component Value Date/Time   NA 139 01/27/2018 1148   K 4.7 01/27/2018 1148   CL 104 01/27/2018 1148   CO2 30 01/27/2018 1148   GLUCOSE 95 01/27/2018 1148   BUN 11 01/27/2018 1148   CREATININE 0.77 01/27/2018 1148   CALCIUM 9.5 01/27/2018 1148   PROT 5.7 (L) 04/21/2009 0400   ALBUMIN 3.2 (L) 04/21/2009 0400   AST 27 04/21/2009 0400   ALT 43 (H) 04/21/2009 0400   ALKPHOS 58 04/21/2009 0400   BILITOT 0.4 04/21/2009 0400   GFRNONAA >60 04/21/2009 0400   GFRAA  04/21/2009 0400    >60        The eGFR has been calculated using the MDRD equation. This calculation has not been validated in all clinical situations. eGFR's persistently <60 mL/min signify possible Chronic Kidney Disease.       Component Value Date/Time   WBC 4.6 01/27/2018 1148   RBC 4.57 01/27/2018 1148   HGB 14.3 01/27/2018 1148   HCT 42.2 01/27/2018 1148    PLT 311.0 01/27/2018 1148   MCV 92.4 01/27/2018 1148   MCHC 33.9 01/27/2018 1148   RDW 13.3 01/27/2018 1148    No results found for: POCLITH, LITHIUM   Lab Results  Component Value Date   VALPROATE 25.3 (L) 10/26/2008     .res Assessment: Plan:    PTSD (post-traumatic stress disorder)  Panic disorder with agoraphobia  Bipolar 1 disorder, mixed, moderate (HCC)   Greater than 50% of face to face time with patient was spent on counseling and coordination of care. We discussed her chronic sx of depression, anxiety and insomnia combined with medication sensitivity which makes it very difficult to treat her.  Multiple med failures.  We discussed the short-term risks associated with benzodiazepines including sedation and increased fall risk among others.  Discussed long-term side effect risk including dependence, potential withdrawal symptoms, and the potential eventual dose-related risk of dementia.  Especially risky in PTSD bc of the high risk of tolerance in PTSD patients.  Therefore, we will not increase the dosage.  Disc research showing high dosage gabapentin and Lyrica can sometimes help and has less risk of the above.  First trial of increase increase gabapentin: For anxiety and sleep increase gabapentin to 1 twice a day and 2 at bedtime for 5-7 days. If needed increase to 2 capsules 3 times a day.   Wait 1-2 weeks and if you are still having anxiety and sleep problems we can increase to 3 capsules 3 times a day. Disc SE in detail  This appt  was 30 mins.  FU 6 weeks  Lynder Parents, MD, DFAPA   Please see After Visit Summary for patient specific instructions.  Future Appointments  Date Time Provider [place]  06/17/2018  3:00 PM Cottle, Billey Co., MD [facility] None  08/28/2018 10:15 AM Lucretia Kern, DO [facility]  11/20/2018 11:00 AM Wynetta Fines, RN [facility]    No orders of the defined types were placed in this encounter.      -------------------------------

## 2018-05-02 NOTE — Patient Instructions (Signed)
For anxiety and sleep increase gabapentin to 1 twice a day and 2 at bedtime for 5-7 days. If needed increase to 2 capsules 3 times a day.   Wait 1-2 weeks and if you are still having anxiety and sleep problems we can increase to 3 capsules 3 times a day.

## 2018-05-07 ENCOUNTER — Telehealth: Payer: Self-pay | Admitting: Psychiatry

## 2018-05-07 NOTE — Telephone Encounter (Signed)
Stated was pleased with results of the Gabapentin. Would indeed like a prescription.

## 2018-05-07 NOTE — Telephone Encounter (Signed)
Pt's message.

## 2018-05-08 ENCOUNTER — Ambulatory Visit: Payer: PPO | Admitting: Gastroenterology

## 2018-05-09 ENCOUNTER — Telehealth: Payer: Self-pay | Admitting: Psychiatry

## 2018-05-09 ENCOUNTER — Other Ambulatory Visit: Payer: Self-pay

## 2018-05-12 ENCOUNTER — Other Ambulatory Visit: Payer: Self-pay

## 2018-05-12 MED ORDER — GABAPENTIN 300 MG PO CAPS: 300.0000 mg | ORAL_CAPSULE | Freq: Three times a day (TID) | ORAL | 2 refills | Status: DC

## 2018-05-12 NOTE — Telephone Encounter (Signed)
Need to check dosage on what she's taking

## 2018-05-25 ENCOUNTER — Other Ambulatory Visit: Payer: Self-pay | Admitting: Psychiatry

## 2018-06-12 ENCOUNTER — Other Ambulatory Visit: Payer: Self-pay | Admitting: Family Medicine

## 2018-06-17 ENCOUNTER — Encounter: Payer: Self-pay | Admitting: Psychiatry

## 2018-06-17 ENCOUNTER — Ambulatory Visit: Payer: PPO | Admitting: Psychiatry

## 2018-06-17 DIAGNOSIS — F4310 Post-traumatic stress disorder, unspecified: Secondary | ICD-10-CM

## 2018-06-17 DIAGNOSIS — F4001 Agoraphobia with panic disorder: Secondary | ICD-10-CM

## 2018-06-17 DIAGNOSIS — F3162 Bipolar disorder, current episode mixed, moderate: Secondary | ICD-10-CM | POA: Diagnosis not present

## 2018-06-17 MED ORDER — GABAPENTIN 300 MG PO CAPS: 900.0000 mg | ORAL_CAPSULE | Freq: Three times a day (TID) | ORAL | 2 refills | Status: DC

## 2018-06-17 NOTE — Patient Instructions (Signed)
For anxiety and sleep increase gabapentin to 1 twice a day and 2 at bedtime for 5-7 days. Then increase to 2 twice daily and 3 at night for a week. Then if needed for anxiety or sleep, then increase to 2 twice daily and 4 at night.

## 2018-06-17 NOTE — Progress Notes (Signed)
Brittany [name] [id number] May 19, 1956 62 y.o.  Subjective:   Patient ID:  Brittany [name] is a 62 y.o. (DOB [birth year]) female.  Chief Complaint:  Chief Complaint  Patient presents with  . Follow-up    Medication Management  . Other    Pt. states she had catatonic episodes on Christmas days.     HPI last visit November 15 Brittany [name] presents to the office today for follow-up of long-term depression and anxiety and insomnia.    At last visit decision made to try to use gabapentin to help anxiety .  She got up to 900 mg TID but only for a couple of days and then reduced it and ran out.  Made her dizzy and some sleepiness.  It was helping anxiety and her sleep.  "Not good at all right now".  I'm zoning out.  Scares her.  She doesn't remember everything that she did.  Happened at family's house with family.  I shouldn't have went.  Remembers bits and pieces of what happened.   Was told she had an anger outburst and raged and doesn't remember saying any of the things she said.  She got loud and yelled and was escorted out of the house.  This has happened 2 other times with the first being in April.  Thinks it was triggered by PTSD.   She stopped the fluoxetine and carbamazepine.  At first noticed no changes, now feels better.  Less tired and not as depressed.  Apparently CBZ was making her sleepy and maybe down.  Couple NM lately.  Mind can race on negative things at night and then takes Xanax.  Sleep variable 3-6 hours at HS.  CC now is the anxiety and fear of going places and being in public. Afraid of recurrent rage.  There are a couple of women in her building that have been mean and brings back memories of being bullied as a kid.  Wants to increase the Xanax for anxiety.  2 panic attacks lately, one triggered at the gym over fear of asthma. The other was without reason.  Stopped therapy 3 mos ago.  Multiple med failures. Olanzapine, CBZ, Lamotrigine, risperidone  with AE, lithium, gabapentin, Seroquel SE, VPA, restoril, Xanax, Trazodone NR, Ambien, hyrdroxyzine, doxazosin, propranolol, sertraline, fluoxetine, Lexapro, Geodon, Saphris, Abilify, Rexulti, Buspar, Latuda,   Review of Systems:  Review of Systems  Constitutional: Positive for unexpected weight change.  Neurological: Negative for tremors and weakness.  Psychiatric/Behavioral: Positive for dysphoric mood. Negative for agitation, behavioral problems, confusion, decreased concentration, hallucinations, self-injury, sleep disturbance and suicidal ideas. The patient is nervous/anxious. The patient is not hyperactive.   Stress eating.  Medications: I have reviewed the patient's current medications.  Current Outpatient Medications  Medication Sig Dispense Refill  . Albuterol Sulfate (PROAIR RESPICLICK) 267 (90 Base) MCG/ACT AEPB Inhale 2 puffs into the lungs every 6 (six) hours as needed. 1 each 1  . ALPRAZolam (XANAX) 0.5 MG tablet Take 1 tablet (0.5 mg total) by mouth 2 (two) times daily. 60 tablet 2  . busPIRone (BUSPAR) 15 MG tablet Take 15 mg by mouth 2 (two) times daily.     . Cholecalciferol (VITAMIN D-3 PO) Take 5,000 Units by mouth daily.     . colestipol (COLESTID) 1 g tablet Take 2 tablets by mouth daily x 1 week, then increase to 2 tablets by mouth twice daily thereafter. 120 tablet 3  . doxazosin (CARDURA) 4 MG tablet Take 1.5 tablets (6 mg total) by  mouth daily. 45 tablet 3  . fluticasone (FLONASE) 50 MCG/ACT nasal spray Place 2 sprays into both nostrils daily. 16 g 6  . gabapentin (NEURONTIN) 300 MG capsule Take 1 capsule (300 mg total) by mouth 3 (three) times daily. 90 capsule 2  . lithium 300 MG tablet Take 600 mg by mouth daily.    Marland Kitchen lithium carbonate 150 MG capsule TAKE 1 CAPSULE BY MOUTH ONCE DAILY IN THE EVENING 90 capsule 1  . Melatonin 3 MG TABS Take 1 tablet by mouth at bedtime.    . montelukast (SINGULAIR) 10 MG tablet TAKE 1 TABLET BY MOUTH AT BEDTIME 90 tablet 1  .  Multiple Vitamins-Minerals (MULTIVITAMIN WITH MINERALS) tablet Take 1 tablet by mouth daily.    Marland Kitchen triamcinolone cream (KENALOG) 0.1 % Apply 1 application topically 2 (two) times daily. For itchy patch on R hand 30 g 0   No current facility-administered medications for this visit.     Medication Side Effects: None  Allergies:  Allergies  Allergen Reactions  . Aspirin Other (See Comments)    discomfort  . Codeine Itching  . Sulfamethoxazole-Trimethoprim Other (See Comments)    Unknown reaction per pt  . Trimethoprim Other (See Comments)    Unknown, per pt  . Ultram [Tramadol] Itching  . Vioxx [Rofecoxib]     Unknown per pt    Past Medical History:  Diagnosis Date  . Abnormal EKG   . Anxiety   . Asthma   . Bipolar depression ([place]), Anxiety, PTSD, Panic disorder    -managed by [facility]  . Depression   . Foot pain   . GERD (gastroesophageal reflux disease)   . Heart murmur    as a child  . Insomnia   . Leg swelling   . Osteoporosis   . Tachycardia     Family History  Problem Relation Age of Onset  . Heart failure Mother   . Hypertension Mother   . Hyperlipidemia Mother   . COPD Paternal Aunt        alot of aunts and uncle COPD or Emphysema  . Emphysema Paternal Uncle   . Colon polyps Maternal Aunt   . Breast cancer Neg Hx   . Colon cancer Neg Hx   . Esophageal cancer Neg Hx   . Pancreatic cancer Neg Hx   . Stomach cancer Neg Hx   . Liver disease Neg Hx     Social History   Socioeconomic History  . Marital status: Single    Spouse name: Not on file  . Number of children: 3  . Years of education: Not on file  . Highest education level: Not on file  Occupational History  . Occupation: disabled  Social Needs  . Financial resource strain: Not on file  . Food insecurity:    Worry: Not on file    Inability: Not on file  . Transportation needs:    Medical: Not on file    Non-medical: Not on file  Tobacco Use  . Smoking status: Former Smoker     Packs/day: 20.00    Years: 1.00    Pack years: 20.00    Last attempt to quit: 06/19/1999    Years since quitting: 19.0  . Smokeless tobacco: Never Used  . Tobacco comment: 20 year estimate but probably less   Substance and Sexual Activity  . Alcohol use: Yes    Alcohol/week: 0.0 standard drinks    Comment: Occass.   . Drug use: No  .  Sexual activity: Never  Lifestyle  . Physical activity:    Days per week: Not on file    Minutes per session: Not on file  . Stress: Not on file  Relationships  . Social connections:    Talks on phone: Not on file    Gets together: Not on file    Attends religious service: Not on file    Active member of club or organization: Not on file    Attends meetings of clubs or organizations: Not on file    Relationship status: Not on file  . Intimate partner violence:    Fear of current or ex partner: Not on file    Emotionally abused: Not on file    Physically abused: Not on file    Forced sexual activity: Not on file  Other Topics Concern  . Not on file  Social History Narrative   Work or School: Disabled 2ndary to psychiatric conditions      Home Situation: lives alone with 2 cats and one dog      Spiritual Beliefs: Christian      Lifestyle: no regular exercise, diet  Not great - wants to embark on healthier lifestyle    Past Medical History, Surgical history, Social history, and Family history were reviewed and updated as appropriate.   Please see review of systems for further details on the patient's review from today.   Objective:   Physical Exam:  There were no vitals taken for this visit.  Physical Exam Constitutional:      Appearance: She is obese.  Neurological:     Mental Status: She is alert.     Motor: No tremor.     Gait: Gait normal.  Psychiatric:        Attention and Perception: She is attentive.        Mood and Affect: Mood is anxious and depressed.        Speech: Speech normal.        Behavior: Behavior normal.         Thought Content: Thought content is not paranoid. Thought content does not include homicidal or suicidal ideation.        Cognition and Memory: Cognition normal.        Judgment: Judgment normal.     Comments: Fair insight and judgment. Talkative about life events. ? Motivation for recovery.     Lab Review:     Component Value Date/Time   NA 139 01/27/2018 1148   K 4.7 01/27/2018 1148   CL 104 01/27/2018 1148   CO2 30 01/27/2018 1148   GLUCOSE 95 01/27/2018 1148   BUN 11 01/27/2018 1148   CREATININE 0.77 01/27/2018 1148   CALCIUM 9.5 01/27/2018 1148   PROT 5.7 (L) 04/21/2009 0400   ALBUMIN 3.2 (L) 04/21/2009 0400   AST 27 04/21/2009 0400   ALT 43 (H) 04/21/2009 0400   ALKPHOS 58 04/21/2009 0400   BILITOT 0.4 04/21/2009 0400   GFRNONAA >60 04/21/2009 0400   GFRAA  04/21/2009 0400    >60        The eGFR has been calculated using the MDRD equation. This calculation has not been validated in all clinical situations. eGFR's persistently <60 mL/min signify possible Chronic Kidney Disease.       Component Value Date/Time   WBC 4.6 01/27/2018 1148   RBC 4.57 01/27/2018 1148   HGB 14.3 01/27/2018 1148   HCT 42.2 01/27/2018 1148   PLT 311.0 01/27/2018 1148   MCV  92.4 01/27/2018 1148   MCHC 33.9 01/27/2018 1148   RDW 13.3 01/27/2018 1148    No results found for: POCLITH, LITHIUM   Lab Results  Component Value Date   VALPROATE 25.3 (L) 10/26/2008     .res Assessment: Plan:    PTSD (post-traumatic stress disorder)  Panic disorder with agoraphobia  Bipolar 1 disorder, mixed, moderate (HCC)  Chronic noncompliance complicates treatment. Likey borderline personality disorder.  Greater than 50% of face to face time with patient was spent on counseling and coordination of care. We discussed the recurrence of a dissociative rage incident at Christmas.  She thinks it is PTSD related bc of what has happened before.  She said she had not taken BZ that  day.  Discussed the usual option for rage being VPA.  She took it in 2010 but doesn't remember the effects.  Disc pros/cons of this vs retry gabapentin.  Recognizes negative effects on family of her behavior.  Discussed safety plan at length with patient.  Advised patient to contact office with any worsening signs and symptoms.  Instructed patient to go to the [facility] emergency room for evaluation if experiencing any acute safety concerns, to include suicidal intent and violent intent.  We discussed her chronic sx of depression, anxiety and insomnia combined with medication sensitivity which makes it very difficult to treat her.  Multiple med failures.  Guarded prognosis.  We discussed the short-term risks associated with benzodiazepines including sedation and increased fall risk among others.  Discussed long-term side effect risk including dependence, potential withdrawal symptoms, and the potential eventual dose-related risk of dementia.  Especially risky in PTSD bc of the high risk of tolerance in PTSD patients.  Therefore, we will not increase the dosage.  She's aware of the risk of disinhibition from Xanax.  Disc research showing high dosage gabapentin and Lyrica can sometimes help and has less risk of the above.  First trial of increase increase gabapentin: For anxiety and sleep increase gabapentin to 1 twice a day and 2 at bedtime for 5-7 days. If needed increase to 2 capsules 3 times a day.   Wait 1-2 weeks and if you are still having anxiety and sleep problems we can increase to 3 capsules 3 times a day. Disc SE in detail.  Call if a problem.  Don't just stop it.  Rec restart therapy.  Says she can't afford it.  This appt was 35 mins.  FU 6 weeks  Lynder Parents, MD, DFAPA   Please see After Visit Summary for patient specific instructions.  Future Appointments  Date Time Provider [place]  08/28/2018 10:15 AM Colin Benton R, DO [facility]  11/20/2018 11:00 AM  [facility]    No orders of the defined types were placed in this encounter.     -------------------------------

## 2018-07-22 ENCOUNTER — Other Ambulatory Visit: Payer: Self-pay | Admitting: Psychiatry

## 2018-07-22 NOTE — Telephone Encounter (Signed)
Last fill 01/09

## 2018-07-28 ENCOUNTER — Encounter: Payer: Self-pay | Admitting: Gastroenterology

## 2018-07-31 ENCOUNTER — Ambulatory Visit: Payer: PPO | Admitting: Psychiatry

## 2018-08-07 ENCOUNTER — Other Ambulatory Visit: Payer: Self-pay | Admitting: Psychiatry

## 2018-08-27 ENCOUNTER — Telehealth: Payer: Self-pay

## 2018-08-27 MED ORDER — LITHIUM CARBONATE 300 MG PO TABS: 600.0000 mg | ORAL_TABLET | Freq: Every day | ORAL | 0 refills | Status: DC

## 2018-08-27 MED ORDER — LITHIUM CARBONATE 150 MG PO CAPS: ORAL_CAPSULE | ORAL | 0 refills | Status: DC

## 2018-08-27 NOTE — Telephone Encounter (Signed)
Received fax from [facility] requesting refill on lithium, this is a change in pt's pharmacy. Pt takes lithium 300mg  2 at hs with lithium 150mg  1 at hs.   Last visit 06/17/2018  Due for follow up now has office visit 09/08/2018 scheduled

## 2018-08-28 ENCOUNTER — Ambulatory Visit: Payer: PPO | Admitting: Family Medicine

## 2018-08-29 ENCOUNTER — Telehealth: Payer: Self-pay

## 2018-08-29 NOTE — Telephone Encounter (Signed)
Author phoned pt. to attempt to reschedule 6/4 AWV appointment. Appointment rescheduled for 7/15.

## 2018-09-08 ENCOUNTER — Other Ambulatory Visit: Payer: Self-pay

## 2018-09-08 ENCOUNTER — Encounter: Payer: Self-pay | Admitting: Psychiatry

## 2018-09-08 ENCOUNTER — Ambulatory Visit: Payer: PPO | Admitting: Psychiatry

## 2018-09-08 DIAGNOSIS — F3162 Bipolar disorder, current episode mixed, moderate: Secondary | ICD-10-CM | POA: Diagnosis not present

## 2018-09-08 DIAGNOSIS — F4001 Agoraphobia with panic disorder: Secondary | ICD-10-CM | POA: Diagnosis not present

## 2018-09-08 DIAGNOSIS — F4310 Post-traumatic stress disorder, unspecified: Secondary | ICD-10-CM

## 2018-09-08 DIAGNOSIS — F515 Nightmare disorder: Secondary | ICD-10-CM

## 2018-09-08 MED ORDER — OXCARBAZEPINE 300 MG PO TABS: ORAL_TABLET | ORAL | 1 refills | Status: DC

## 2018-09-08 MED ORDER — ALPRAZOLAM 0.5 MG PO TABS: 0.5000 mg | ORAL_TABLET | Freq: Three times a day (TID) | ORAL | 1 refills | Status: DC | PRN

## 2018-09-08 MED ORDER — DOXAZOSIN MESYLATE 4 MG PO TABS: 6.0000 mg | ORAL_TABLET | Freq: Every day | ORAL | 1 refills | Status: DC

## 2018-09-08 NOTE — Progress Notes (Signed)
Brittany [name] [id number] [birth year] 63 y.o.  Subjective:   Patient ID:  Brittany [name] is a 63 y.o. (DOB Nov 21, 1955) female.  Chief Complaint:  Chief Complaint  Patient presents with  . Anxiety  . Depression  . Sleeping Problem    HPI last visit December 31 Brittany [name] presents to the office today for follow-up of long-term depression and anxiety and insomnia.    At last visit decision made to try to increase again gabapentin to help anxiety .  She got up to 900 mg TID but only for several week but SE shaking and jerks and fluid retention and then reduced it to 600 TID and the jerks resolved.  Made her dizzy and some sleepiness.  She says it did not help much with anxiety and sleep but then says the anxiety is better than it was last time.  Under stress the anxiety is not elevating as much.  Lost her dog which is hard.  Prayed and that helped.  Still wants more Xanax.  Worries about weight gain with meds.  Isolating herself and has not had unusual rage or anger outbursts.  With triggers she was on gabapentin 900 TID at the time.  No triggers with less gabapentin.  Till problems with thinking and concentrating and mind want slow down.  Sometimes tornadic thoughts.  Random thoughts racing.  Hard to read and understand.  More debt charging online.  Don't realize the backlash.  Stopped therapy about September mos ago.  Multiple med failures. Olanzapine, CBZ SE, Lamotrigine, risperidone with AE, lithium, gabapentin, Seroquel SE, VPA, restoril, Xanax, Trazodone NR, Ambien, hyrdroxyzine, doxazosin, propranolol, sertraline, fluoxetine NR, Lexapro, Geodon, Saphris, Abilify, Rexulti seemed to help for short while but $, Buspar, Latuda,   Review of Systems:  Review of Systems  Constitutional: Positive for unexpected weight change.  Neurological: Negative for tremors and weakness.  Psychiatric/Behavioral: Positive for dysphoric mood. Negative for agitation, behavioral  problems, confusion, decreased concentration, hallucinations, self-injury, sleep disturbance and suicidal ideas. The patient is nervous/anxious. The patient is not hyperactive.   Stress eating.  Medications: I have reviewed the patient's current medications.  Current Outpatient Medications  Medication Sig Dispense Refill  . busPIRone (BUSPAR) 15 MG tablet TAKE 1 TABLET BY MOUTH THREE TIMES DAILY 270 tablet 0  . Cholecalciferol (VITAMIN D-3 PO) Take 5,000 Units by mouth daily.     Marland Kitchen doxazosin (CARDURA) 4 MG tablet Take 1.5 tablets (6 mg total) by mouth daily. 45 tablet 3  . fluticasone (FLONASE) 50 MCG/ACT nasal spray Place 2 sprays into both nostrils daily. 16 g 6  . gabapentin (NEURONTIN) 300 MG capsule Take 3 capsules (900 mg total) by mouth 3 (three) times daily. (Patient taking differently: Take 600 mg by mouth 3 (three) times daily. ) 270 capsule 2  . lithium 300 MG tablet Take 2 tablets (600 mg total) by mouth daily. 180 tablet 0  . lithium carbonate 150 MG capsule TAKE 1 CAPSULE BY MOUTH ONCE DAILY IN THE EVENING 90 capsule 0  . Melatonin 3 MG TABS Take 1 tablet by mouth at bedtime.    . montelukast (SINGULAIR) 10 MG tablet TAKE 1 TABLET BY MOUTH AT BEDTIME 90 tablet 1  . Albuterol Sulfate (PROAIR RESPICLICK) 701 (90 Base) MCG/ACT AEPB Inhale 2 puffs into the lungs every 6 (six) hours as needed. 1 each 1  . ALPRAZolam (XANAX) 0.5 MG tablet Take 1 tablet (0.5 mg total) by mouth 3 (three) times daily as needed for anxiety. [place]  tablet 1  . colestipol (COLESTID) 1 g tablet Take 2 tablets by mouth daily x 1 week, then increase to 2 tablets by mouth twice daily thereafter. 120 tablet 3  . doxazosin (CARDURA) 4 MG tablet Take 1.5 tablets (6 mg total) by mouth at bedtime. 135 tablet 1  . Multiple Vitamins-Minerals (MULTIVITAMIN WITH MINERALS) tablet Take 1 tablet by mouth daily.    . Oxcarbazepine (TRILEPTAL) 300 MG tablet 300 twice daily 60 tablet 1  . triamcinolone cream (KENALOG) 0.1 % Apply 1  application topically 2 (two) times daily. For itchy patch on R hand 30 g 0   No current facility-administered medications for this visit.     Medication Side Effects: None  Allergies:  Allergies  Allergen Reactions  . Aspirin Other (See Comments)    discomfort  . Codeine Itching  . Sulfamethoxazole-Trimethoprim Other (See Comments)    Unknown reaction per pt  . Trimethoprim Other (See Comments)    Unknown, per pt  . Ultram [Tramadol] Itching  . Vioxx [Rofecoxib]     Unknown per pt    Past Medical History:  Diagnosis Date  . Abnormal EKG   . Anxiety   . Asthma   . Bipolar depression ([place]), Anxiety, PTSD, Panic disorder    -managed by [facility]  . Depression   . Foot pain   . GERD (gastroesophageal reflux disease)   . Heart murmur    as a child  . Insomnia   . Leg swelling   . Osteoporosis   . Tachycardia     Family History  Problem Relation Age of Onset  . Heart failure Mother   . Hypertension Mother   . Hyperlipidemia Mother   . COPD Paternal Aunt        alot of aunts and uncle COPD or Emphysema  . Emphysema Paternal Uncle   . Colon polyps Maternal Aunt   . Breast cancer Neg Hx   . Colon cancer Neg Hx   . Esophageal cancer Neg Hx   . Pancreatic cancer Neg Hx   . Stomach cancer Neg Hx   . Liver disease Neg Hx     Social History   Socioeconomic History  . Marital status: Single    Spouse name: Not on file  . Number of children: 3  . Years of education: Not on file  . Highest education level: Not on file  Occupational History  . Occupation: disabled  Social Needs  . Financial resource strain: Not on file  . Food insecurity:    Worry: Not on file    Inability: Not on file  . Transportation needs:    Medical: Not on file    Non-medical: Not on file  Tobacco Use  . Smoking status: Former Smoker    Packs/day: 20.00    Years: 1.00    Pack years: 20.00    Last attempt to quit: 06/19/1999    Years since quitting: 19.2  . Smokeless  tobacco: Never Used  . Tobacco comment: 20 year estimate but probably less   Substance and Sexual Activity  . Alcohol use: Yes    Alcohol/week: 0.0 standard drinks    Comment: Occass.   . Drug use: No  . Sexual activity: Never  Lifestyle  . Physical activity:    Days per week: Not on file    Minutes per session: Not on file  . Stress: Not on file  Relationships  . Social connections:    Talks on phone: Not on  file    Gets together: Not on file    Attends religious service: Not on file    Active member of club or organization: Not on file    Attends meetings of clubs or organizations: Not on file    Relationship status: Not on file  . Intimate partner violence:    Fear of current or ex partner: Not on file    Emotionally abused: Not on file    Physically abused: Not on file    Forced sexual activity: Not on file  Other Topics Concern  . Not on file  Social History Narrative   Work or School: Disabled 2ndary to psychiatric conditions      Home Situation: lives alone with 2 cats and one dog      Spiritual Beliefs: Christian      Lifestyle: no regular exercise, diet  Not great - wants to embark on healthier lifestyle    Past Medical History, Surgical history, Social history, and Family history were reviewed and updated as appropriate.   Please see review of systems for further details on the patient's review from today.   Objective:   Physical Exam:  There were no vitals taken for this visit.  Physical Exam Constitutional:      Appearance: She is obese.  Neurological:     Mental Status: She is alert.     Motor: No tremor.     Gait: Gait normal.  Psychiatric:        Attention and Perception: She is attentive.        Mood and Affect: Mood is anxious and depressed.        Speech: She is communicative. Speech is not delayed or tangential.        Behavior: Behavior normal.        Thought Content: Thought content is not paranoid. Thought content does not include  homicidal or suicidal ideation.        Cognition and Memory: Cognition normal.        Judgment: Judgment normal.     Comments: Fair insight and judgment. Talkative about life events. More talkative than last time and affect looks less depressed. ? Motivation for recovery.     Lab Review:     Component Value Date/Time   NA 139 01/27/2018 1148   K 4.7 01/27/2018 1148   CL 104 01/27/2018 1148   CO2 30 01/27/2018 1148   GLUCOSE 95 01/27/2018 1148   BUN 11 01/27/2018 1148   CREATININE 0.77 01/27/2018 1148   CALCIUM 9.5 01/27/2018 1148   PROT 5.7 (L) 04/21/2009 0400   ALBUMIN 3.2 (L) 04/21/2009 0400   AST 27 04/21/2009 0400   ALT 43 (H) 04/21/2009 0400   ALKPHOS 58 04/21/2009 0400   BILITOT 0.4 04/21/2009 0400   GFRNONAA >60 04/21/2009 0400   GFRAA  04/21/2009 0400    >60        The eGFR has been calculated using the MDRD equation. This calculation has not been validated in all clinical situations. eGFR's persistently <60 mL/min signify possible Chronic Kidney Disease.       Component Value Date/Time   WBC 4.6 01/27/2018 1148   RBC 4.57 01/27/2018 1148   HGB 14.3 01/27/2018 1148   HCT 42.2 01/27/2018 1148   PLT 311.0 01/27/2018 1148   MCV 92.4 01/27/2018 1148   MCHC 33.9 01/27/2018 1148   RDW 13.3 01/27/2018 1148    No results found for: POCLITH, LITHIUM   Lab Results  Component  Value Date   VALPROATE 25.3 (L) 10/26/2008     .res Assessment: Plan:    Bipolar 1 disorder, mixed, moderate (HCC)  PTSD (post-traumatic stress disorder)  Panic disorder with agoraphobia  Nightmares associated with chronic post-traumatic stress disorder - Plan: doxazosin (CARDURA) 4 MG tablet  Chronic noncompliance complicates treatment. Likey borderline personality disorder.  Greater than 50% of face to face time with patient was spent on counseling and coordination of care. We discussed her chronic sx of depression, anxiety and insomnia combined with medication sensitivity  which makes it very difficult to treat her.  Multiple med failures.  Guarded prognosis.  She is having some mixed symptoms with racing thoughts and spending in addition to the depressive symptoms.  We may need better mood stability.  Discussed the next option of Trileptal and  VPA.  She took  VPA 2010 but doesn't remember the effects.  Disc pros/cons of this vs Trileptal.  Trileptal is milder and she's very med sensitive. Recognizes negative effects on family of her behavior.  Discussed safety plan at length with patient.  Advised patient to contact office with any worsening signs and symptoms.  Instructed patient to go to the [facility] emergency room for evaluation if experiencing any acute safety concerns, to include suicidal intent and violent intent.  We discussed the short-term risks associated with benzodiazepines including sedation and increased fall risk among others. Will agree to trial of Xanax TID prn though this not ideal and tolerance is a concern.  However it has a short half life and BID will not help throughout the day.   Discussed long-term side effect risk including dependence, potential withdrawal symptoms, and the potential eventual dose-related risk of dementia.  Especially risky in PTSD bc of the high risk of tolerance in PTSD patients.   She's aware of the risk of disinhibition from Xanax.  Disc research showing high dosage gabapentin and Lyrica can sometimes help and has less risk of the above.  NM controlled with doxazosin  Reduce gabapentin by 1 capsule every 3 days.  Start one half oxcarbazepine at night for 6 days,  then one half twice daily for 6 days, Then one half in the morning and 1 in the evening for 6 days, Then 1 twice daily  Rec restart therapy.  Says she can't afford it.  This appt was 30 mins.  FU 6 weeks  Lynder Parents, MD, DFAPA   Please see After Visit Summary for patient specific instructions.  Future Appointments  Date Time Provider [place]  10/16/2018  9:30 AM Cottle, Billey Co., MD [facility] None  12/31/2018  8:00 AM [facility]    No orders of the defined types were placed in this encounter.     -------------------------------

## 2018-09-08 NOTE — Patient Instructions (Signed)
Reduce gabapentin by 1 capsule every 3 days.  Start one half oxcarbazepine at night for 6 days,  then one half twice daily for 6 days, Then one half in the morning and 1 in the evening for 6 days, Then 1 twice daily

## 2018-09-15 ENCOUNTER — Other Ambulatory Visit: Payer: Self-pay | Admitting: Psychiatry

## 2018-09-15 ENCOUNTER — Telehealth: Payer: Self-pay | Admitting: Psychiatry

## 2018-09-15 NOTE — Telephone Encounter (Signed)
Patient is requesting emergency Trileptal to be filled at the [facility] on [address]. The mail order has not been sent and she has been without for four days. Only requesting just enough to get by

## 2018-09-15 NOTE — Telephone Encounter (Signed)
Patient is requesting emergency of doxazosin not trileptal.  Next appt 4/30.  Send to [facility]  on [address].  Same reason as explained in previous note

## 2018-09-15 NOTE — Telephone Encounter (Signed)
Doxazosin submitted to local [facility]

## 2018-09-15 NOTE — Telephone Encounter (Signed)
This was submitted on 03/23 to [facility] on [address], needs to check with pharmacy.

## 2018-10-08 ENCOUNTER — Other Ambulatory Visit: Payer: Self-pay | Admitting: Family Medicine

## 2018-10-08 ENCOUNTER — Other Ambulatory Visit: Payer: Self-pay | Admitting: Gastroenterology

## 2018-10-09 ENCOUNTER — Other Ambulatory Visit: Payer: Self-pay | Admitting: Family Medicine

## 2018-10-09 DIAGNOSIS — J45901 Unspecified asthma with (acute) exacerbation: Secondary | ICD-10-CM

## 2018-10-09 MED ORDER — ALBUTEROL SULFATE 108 (90 BASE) MCG/ACT IN AEPB: 2.0000 | INHALATION_SPRAY | Freq: Four times a day (QID) | RESPIRATORY_TRACT | 1 refills | Status: DC | PRN

## 2018-10-16 ENCOUNTER — Ambulatory Visit (INDEPENDENT_AMBULATORY_CARE_PROVIDER_SITE_OTHER): Payer: PPO | Admitting: Psychiatry

## 2018-10-16 ENCOUNTER — Encounter: Payer: Self-pay | Admitting: Psychiatry

## 2018-10-16 ENCOUNTER — Other Ambulatory Visit: Payer: Self-pay

## 2018-10-16 DIAGNOSIS — F515 Nightmare disorder: Secondary | ICD-10-CM | POA: Diagnosis not present

## 2018-10-16 DIAGNOSIS — F4310 Post-traumatic stress disorder, unspecified: Secondary | ICD-10-CM

## 2018-10-16 DIAGNOSIS — F4001 Agoraphobia with panic disorder: Secondary | ICD-10-CM | POA: Diagnosis not present

## 2018-10-16 DIAGNOSIS — F3162 Bipolar disorder, current episode mixed, moderate: Secondary | ICD-10-CM | POA: Diagnosis not present

## 2018-10-16 NOTE — Progress Notes (Signed)
Brittany [name] [id number] 04-28-1956 63 y.o.   Virtual Visit via Telephone Note  I connected with pt by telephone and verified that I am speaking with the correct person using two identifiers.   I discussed the limitations, risks, security and privacy concerns of performing an evaluation and management service by telephone and the availability of in person appointments. I also discussed with the patient that there may be a patient responsible charge related to this service. The patient expressed understanding and agreed to proceed.  I discussed the assessment and treatment plan with the patient. The patient was provided an opportunity to ask questions and all were answered. The patient agreed with the plan and demonstrated an understanding of the instructions.   The patient was advised to call back or seek an in-person evaluation if the symptoms worsen or if the condition fails to improve as anticipated.  I provided 15 minutes of non-face-to-face time during this encounter. The call started at 937 and ended at 952. The patient was located at home and the provider was located office.   Subjective:   Patient ID:  Brittany [name] is a 63 y.o. (DOB Jun 26, 1955) female.  Chief Complaint:  Chief Complaint  Patient presents with  . Follow-up    Medication Management  . Anxiety    Increased   . Depression    Lost her pet in March, Crying a lot.  . Medication Reaction    Itching since started Triliptal and jerking    Anxiety  Symptoms include nervous/anxious behavior. Patient reports no confusion, decreased concentration or suicidal ideas.    Depression         Associated symptoms include no decreased concentration and no suicidal ideas.  Past medical history includes anxiety.    Brittany [name] presents to the office today for follow-up of long-term depression and anxiety and insomnia.    Last seen September 08, 2018 at which time the patient was weaned off of  gabapentin and started on Trileptal up to twice daily.  She has been very med sensitive and difficult to treat because of that.  CO severe itching with Trileptal 300 BID.  Started right away.  Went  Without doxazosin for 2 weeks and thought it was that.  She already reduced the morning dose 150 Am and 300 pm 6 days ago.  No rash.  After shower is fine for an hour.   Lost dog March 9 and anxiety and depression is through the roof.  Lost a real child before and losing dog of 14 years is just as painful.  Takes every thing in me to get me going some days.  Doesn't want to start new meds right now.   No differences off the gabapentin in anxiety nor mood.  Hard to say if energy is better.  Depressed and crying over the dog all the time.  Anyway.  Occ brief energy.   Lost her dog which is hard.  Prayed and that helped.  Still wants more Xanax.  Worries about weight gain with meds.  Till problems with thinking and concentrating and mind want slow down.  Sometimes tornadic thoughts.  Random thoughts racing.  Hard to read and understand.  Stopped therapy about September mos ago.  Multiple med failures. Olanzapine, CBZ SE, Lamotrigine, risperidone with AE, lithium, gabapentin, Seroquel SE, VPA, restoril, Xanax, Trazodone NR, Ambien, hyrdroxyzine, doxazosin, propranolol, sertraline, fluoxetine NR, Lexapro, Geodon, Saphris, Abilify, Rexulti seemed to help for short while but $, Buspar, Latuda, Trileptal itching  Review of Systems:  Review of Systems  Constitutional: Negative for unexpected weight change.  Musculoskeletal: Negative for gait problem.  Neurological: Negative for tremors and weakness.  Psychiatric/Behavioral: Positive for depression and dysphoric mood. Negative for agitation, behavioral problems, confusion, decreased concentration, hallucinations, self-injury, sleep disturbance and suicidal ideas. The patient is nervous/anxious. The patient is not hyperactive.        Please refer to HPI   Stress eating.  Medications: I have reviewed the patient's current medications.  Current Outpatient Medications  Medication Sig Dispense Refill  . Albuterol Sulfate (PROAIR RESPICLICK) 546 (90 Base) MCG/ACT AEPB Inhale 2 puffs into the lungs every 6 (six) hours as needed. 1 each 1  . ALPRAZolam (XANAX) 0.5 MG tablet Take 1 tablet (0.5 mg total) by mouth 3 (three) times daily as needed for anxiety. 90 tablet 1  . busPIRone (BUSPAR) 15 MG tablet TAKE 1 TABLET BY MOUTH THREE TIMES DAILY 270 tablet 0  . Cholecalciferol (VITAMIN D-3 PO) Take 5,000 Units by mouth daily.     . colestipol (COLESTID) 1 g tablet TAKE 2 TABLETS BY MOUTH ONCE DAILY FOR 7 DAYS AND THEN INCREASE TO 2 TABLETS TWICE DAILY 120 tablet 0  . doxazosin (CARDURA) 4 MG tablet Take 1.5 tablets (6 mg total) by mouth at bedtime. 135 tablet 1  . doxazosin (CARDURA) 4 MG tablet TAKE 1 & 1/2 (ONE & ONE-HALF) TABLETS BY MOUTH ONCE DAILY 45 tablet 0  . fluticasone (FLONASE) 50 MCG/ACT nasal spray Place 2 sprays into both nostrils daily. 16 g 6  . lithium 300 MG tablet Take 2 tablets (600 mg total) by mouth daily. 180 tablet 0  . lithium carbonate 150 MG capsule TAKE 1 CAPSULE BY MOUTH ONCE DAILY IN THE EVENING 90 capsule 0  . Melatonin 3 MG TABS Take 1 tablet by mouth at bedtime.    . montelukast (SINGULAIR) 10 MG tablet TAKE 1 TABLET BY MOUTH AT BEDTIME 90 tablet 0  . Oxcarbazepine (TRILEPTAL) 300 MG tablet 300 twice daily 60 tablet 1  . triamcinolone cream (KENALOG) 0.1 % Apply 1 application topically 2 (two) times daily. For itchy patch on R hand 30 g 0   No current facility-administered medications for this visit.     Medication Side Effects: None  Allergies:  Allergies  Allergen Reactions  . Aspirin Other (See Comments)    discomfort  . Codeine Itching  . Sulfamethoxazole-Trimethoprim Other (See Comments)    Unknown reaction per pt  . Trimethoprim Other (See Comments)    Unknown, per pt  . Ultram [Tramadol] Itching  .  Vioxx [Rofecoxib]     Unknown per pt    Past Medical History:  Diagnosis Date  . Abnormal EKG   . Anxiety   . Asthma   . Bipolar depression ([place]), Anxiety, PTSD, Panic disorder    -managed by [facility]  . Depression   . Foot pain   . GERD (gastroesophageal reflux disease)   . Heart murmur    as a child  . Insomnia   . Leg swelling   . Osteoporosis   . Tachycardia     Family History  Problem Relation Age of Onset  . Heart failure Mother   . Hypertension Mother   . Hyperlipidemia Mother   . COPD Paternal Aunt        alot of aunts and uncle COPD or Emphysema  . Emphysema Paternal Uncle   . Colon polyps Maternal Aunt   . Breast cancer Neg Hx   .  Colon cancer Neg Hx   . Esophageal cancer Neg Hx   . Pancreatic cancer Neg Hx   . Stomach cancer Neg Hx   . Liver disease Neg Hx     Social History   Socioeconomic History  . Marital status: Single    Spouse name: Not on file  . Number of children: 3  . Years of education: Not on file  . Highest education level: Not on file  Occupational History  . Occupation: disabled  Social Needs  . Financial resource strain: Not on file  . Food insecurity:    Worry: Not on file    Inability: Not on file  . Transportation needs:    Medical: Not on file    Non-medical: Not on file  Tobacco Use  . Smoking status: Former Smoker    Packs/day: 20.00    Years: 1.00    Pack years: 20.00    Last attempt to quit: 06/19/1999    Years since quitting: 19.3  . Smokeless tobacco: Never Used  . Tobacco comment: 20 year estimate but probably less   Substance and Sexual Activity  . Alcohol use: Yes    Alcohol/week: 0.0 standard drinks    Comment: Occass.   . Drug use: No  . Sexual activity: Never  Lifestyle  . Physical activity:    Days per week: Not on file    Minutes per session: Not on file  . Stress: Not on file  Relationships  . Social connections:    Talks on phone: Not on file    Gets together: Not on file     Attends religious service: Not on file    Active member of club or organization: Not on file    Attends meetings of clubs or organizations: Not on file    Relationship status: Not on file  . Intimate partner violence:    Fear of current or ex partner: Not on file    Emotionally abused: Not on file    Physically abused: Not on file    Forced sexual activity: Not on file  Other Topics Concern  . Not on file  Social History Narrative   Work or School: Disabled 2ndary to psychiatric conditions      Home Situation: lives alone with 2 cats and one dog      Spiritual Beliefs: Christian      Lifestyle: no regular exercise, diet  Not great - wants to embark on healthier lifestyle    Past Medical History, Surgical history, Social history, and Family history were reviewed and updated as appropriate.   Please see review of systems for further details on the patient's review from today.   Objective:   Physical Exam:  There were no vitals taken for this visit.  Physical Exam Neurological:     Mental Status: She is alert and oriented to person, place, and time.     Cranial Nerves: No dysarthria.  Psychiatric:        Attention and Perception: Attention normal.        Mood and Affect: Mood is anxious and depressed. Affect is not angry.        Speech: Speech normal. Speech is not rapid and pressured.        Behavior: Behavior is cooperative.        Thought Content: Thought content normal. Thought content is not paranoid or delusional. Thought content does not include homicidal or suicidal ideation. Thought content does not include homicidal or suicidal plan.  Cognition and Memory: Cognition and memory normal.     Comments: Site and judgment fair not overtly manic.  She may be sabotaging treatment with medication but that is not entirely clear.     Lab Review:     Component Value Date/Time   NA 139 01/27/2018 1148   K 4.7 01/27/2018 1148   CL 104 01/27/2018 1148   CO2 30  01/27/2018 1148   GLUCOSE 95 01/27/2018 1148   BUN 11 01/27/2018 1148   CREATININE 0.77 01/27/2018 1148   CALCIUM 9.5 01/27/2018 1148   PROT 5.7 (L) 04/21/2009 0400   ALBUMIN 3.2 (L) 04/21/2009 0400   AST 27 04/21/2009 0400   ALT 43 (H) 04/21/2009 0400   ALKPHOS 58 04/21/2009 0400   BILITOT 0.4 04/21/2009 0400   GFRNONAA >60 04/21/2009 0400   GFRAA  04/21/2009 0400    >60        The eGFR has been calculated using the MDRD equation. This calculation has not been validated in all clinical situations. eGFR's persistently <60 mL/min signify possible Chronic Kidney Disease.       Component Value Date/Time   WBC 4.6 01/27/2018 1148   RBC 4.57 01/27/2018 1148   HGB 14.3 01/27/2018 1148   HCT 42.2 01/27/2018 1148   PLT 311.0 01/27/2018 1148   MCV 92.4 01/27/2018 1148   MCHC 33.9 01/27/2018 1148   RDW 13.3 01/27/2018 1148    No results found for: POCLITH, LITHIUM   Lab Results  Component Value Date   VALPROATE 25.3 (L) 10/26/2008     .res Assessment: Plan:    Bipolar 1 disorder, mixed, moderate (HCC)  PTSD (post-traumatic stress disorder)  Panic disorder with agoraphobia  Nightmares associated with chronic post-traumatic stress disorder  Chronic noncompliance complicates treatment. Likey borderline personality disorder.  We discussed her chronic sx of depression, anxiety and insomnia combined with medication sensitivity which makes it very difficult to treat her.  Multiple med failures.  Guarded prognosis.    She is having some mixed symptoms with racing thoughts and spending in addition to the depressive symptoms.  We may need better mood stability.  Discussed the next option of   VPA.  She took  VPA 2010 but doesn't remember the effects.   Discussed safety plan at length with patient.  Advised patient to contact office with any worsening signs and symptoms.  Instructed patient to go to the [facility] emergency room for evaluation if experiencing any acute safety  concerns, to include suicidal intent and violent intent.  We discussed the short-term risks associated with benzodiazepines including sedation and increased fall risk among others. Will agree to continue Xanax TID prn though this not ideal and tolerance is a concern.  However it has a short half life and BID will not help throughout the day.   Discussed long-term side effect risk including dependence, potential withdrawal symptoms, and the potential eventual dose-related risk of dementia.  Especially risky in PTSD bc of the high risk of tolerance in PTSD patients.   She's aware of the risk of disinhibition from Xanax.  Disc research showing high dosage gabapentin and Lyrica can sometimes help and has less risk of the above.  She refuses any new meds right now.    Disc risk mood swings without suitable mood stabilizer but she refuses any new meds.  NM controlled with doxazosin  Rec restart therapy.  Says she can't afford it.  FU 3 mos  Lynder Parents, MD, DFAPA   Please see  After Visit Summary for patient specific instructions.  Future Appointments  Date Time Provider [place]  12/31/2018  8:00 AM [facility]    No orders of the defined types were placed in this encounter.     -------------------------------

## 2018-10-20 ENCOUNTER — Other Ambulatory Visit: Payer: Self-pay | Admitting: Family Medicine

## 2018-10-28 ENCOUNTER — Ambulatory Visit (INDEPENDENT_AMBULATORY_CARE_PROVIDER_SITE_OTHER): Payer: PPO | Admitting: Family Medicine

## 2018-10-28 ENCOUNTER — Encounter: Payer: Self-pay | Admitting: Family Medicine

## 2018-10-28 ENCOUNTER — Other Ambulatory Visit: Payer: Self-pay

## 2018-10-28 DIAGNOSIS — E785 Hyperlipidemia, unspecified: Secondary | ICD-10-CM

## 2018-10-28 DIAGNOSIS — J45991 Cough variant asthma: Secondary | ICD-10-CM | POA: Diagnosis not present

## 2018-10-28 DIAGNOSIS — F3130 Bipolar disorder, current episode depressed, mild or moderate severity, unspecified: Secondary | ICD-10-CM | POA: Diagnosis not present

## 2018-10-28 DIAGNOSIS — E669 Obesity, unspecified: Secondary | ICD-10-CM

## 2018-10-28 DIAGNOSIS — E559 Vitamin D deficiency, unspecified: Secondary | ICD-10-CM

## 2018-10-28 MED ORDER — FLUTICASONE PROPIONATE HFA 44 MCG/ACT IN AERO: 2.0000 | INHALATION_SPRAY | Freq: Two times a day (BID) | RESPIRATORY_TRACT | 1 refills | Status: DC

## 2018-10-28 NOTE — Progress Notes (Signed)
Virtual Visit via Video Note  I connected with Brittany [name]  on 10/28/18 at  3:00 PM EDT by a video enabled telemedicine application and verified that I am speaking with the correct person using two identifiers.  Location patient: home Location provider:work or home office Persons participating in the virtual visit: patient, provider  HPI:  Brittany [name] is a pleasant 63 y.o. here for follow up. Chronic medical problems summarized below were reviewed for changes and stability and were updated as needed below. These issues and their treatment remain stable for the most part. She lost her dog in March and is still grieving. Has seen her psychiatrist recently. She has had some allergy issues this spring. She is taking singular, flonase, benadryl, alb prn. This spring has been using the alb about once daily for asthma symptoms. Alb resolves the symptoms. Has been on ICS in the past. Denies fevers, SOB, cough, CP, SOB, DOE, treatment intolerance or new symptoms.  AWV 11/19/17  Obesity, hyperlipidemia, osteoarthritis: -no treatments currently  Osteopenia: -early menopause in early 30s -dexa done 08/2016 with osteopenia -on vit D3  Asthma/GERD: -seeing pulmonologist, Dr. Melvyn Novas  Bipolar disorder/anxiety/panic disorder: -sees [facility] -on disability for psych issues -meds: xanax, buspar, lithium,doxazosin    ROS: See pertinent positives and negatives per HPI.  Past Medical History:  Diagnosis Date  . Abnormal EKG   . Anxiety   . Asthma   . Bipolar depression ([place]), Anxiety, PTSD, Panic disorder    -managed by [facility]  . Depression   . Foot pain   . GERD (gastroesophageal reflux disease)   . Heart murmur    as a child  . Insomnia   . Leg swelling   . Osteoporosis   . Tachycardia     Past Surgical History:  Procedure Laterality Date  . CARPAL TUNNEL RELEASE Left   . CESAREAN SECTION      x 2  . CHOLECYSTECTOMY    . ENDOMETRIAL ABLATION    . KNEE SURGERY Right   . LAPAROSCOPY     x 2  . TUBAL LIGATION    . ULNAR NERVE REPAIR Left     Family History  Problem Relation Age of Onset  . Heart failure Mother   . Hypertension Mother   . Hyperlipidemia Mother   . COPD Paternal Aunt        alot of aunts and uncle COPD or Emphysema  . Emphysema Paternal Uncle   . Colon polyps Maternal Aunt   . Breast cancer Neg Hx   . Colon cancer Neg Hx   . Esophageal cancer Neg Hx   . Pancreatic cancer Neg Hx   . Stomach cancer Neg Hx   . Liver disease Neg Hx     SOCIAL HX: see hpi   Current Outpatient Medications:  .  Albuterol Sulfate (PROAIR RESPICLICK) 790 (90 Base) MCG/ACT AEPB, Inhale 2 puffs into the lungs every 6 (six) hours as needed., Disp: 1 each, Rfl: 1 .  ALPRAZolam (XANAX) 0.5 MG tablet, Take 1 tablet (0.5 mg total) by mouth 3 (three) times daily as needed for anxiety., Disp: 90 tablet, Rfl: 1 .  busPIRone (BUSPAR) 15 MG tablet, TAKE 1 TABLET BY MOUTH THREE TIMES DAILY, Disp: 270 tablet, Rfl: 0 .  Cholecalciferol (VITAMIN D-3 PO), Take 5,000 Units by mouth daily. , Disp: , Rfl:  .  colestipol (COLESTID) 1 g tablet, TAKE 2 TABLETS BY MOUTH ONCE DAILY FOR 7  DAYS AND THEN INCREASE TO 2 TABLETS TWICE DAILY, Disp: 120 tablet, Rfl: 0 .  doxazosin (CARDURA) 4 MG tablet, Take 1.5 tablets (6 mg total) by mouth at bedtime., Disp: 135 tablet, Rfl: 1 .  doxazosin (CARDURA) 4 MG tablet, TAKE 1 & 1/2 (ONE & ONE-HALF) TABLETS BY MOUTH ONCE DAILY, Disp: 45 tablet, Rfl: 0 .  fluticasone (FLONASE) 50 MCG/ACT nasal spray, Use 2 spray(s) in each nostril once daily, Disp: 16 g, Rfl: 3 .  lithium 300 MG tablet, Take 2 tablets (600 mg total) by mouth daily., Disp: 180 tablet, Rfl: 0 .  lithium carbonate 150 MG capsule, TAKE 1 CAPSULE BY MOUTH ONCE DAILY IN THE EVENING, Disp: 90 capsule, Rfl: 0 .  Melatonin 3 MG TABS, Take 1 tablet by mouth at bedtime., Disp: , Rfl:  .  montelukast  (SINGULAIR) 10 MG tablet, TAKE 1 TABLET BY MOUTH AT BEDTIME, Disp: 90 tablet, Rfl: 0 .  triamcinolone cream (KENALOG) 0.1 %, Apply 1 application topically 2 (two) times daily. For itchy patch on R hand, Disp: 30 g, Rfl: 0 .  fluticasone (FLOVENT HFA) 44 MCG/ACT inhaler, Inhale 2 puffs into the lungs 2 (two) times a day., Disp: 3 Inhaler, Rfl: 1  EXAM:  VITALS per patient if applicable: denies fever  GENERAL: alert, oriented, appears well and in no acute distress  HEENT: atraumatic, conjunttiva clear, no obvious abnormalities on inspection of external nose and ears  NECK: normal movements of the head and neck  LUNGS: on inspection no signs of respiratory distress, breathing rate appears normal, no obvious gross SOB, gasping or wheezing  CV: no obvious cyanosis  MS: moves all visible extremities without noticeable abnormality  PSYCH/NEURO: pleasant and cooperative, no obvious depression or anxiety, speech and thought processing grossly intact  ASSESSMENT AND PLAN:  Discussed the following assessment and plan:  Cough variant asthma  vs UACS  - Plan: fluticasone (FLOVENT HFA) 44 MCG/ACT inhaler  Hyperlipidemia, unspecified hyperlipidemia type - Plan: Lipid panel  Obesity (BMI 30.0-34.9) - Plan: Hemoglobin A1c  Bipolar affective disorder, current episode depressed, current episode severity unspecified (HCC)  Vitamin D deficiency - Plan: VITAMIN D 25 Hydroxy (Vit-D Deficiency, Fractures)  Opted to add ICS as asthma is not well controled after discussion risks/benefits. Continue prn alb. Follow up 1 month, sooner if worsening or not improving.  Lifestyle recs  CPE/AWV with labs in 1 month   I discussed the assessment and treatment plan with the patient. The patient was provided an opportunity to ask questions and all were answered. The patient agreed with the plan and demonstrated an understanding of the instructions.   The patient was advised to call back or seek an in-person  evaluation if the symptoms worsen or if the condition fails to improve as anticipated.   Follow up instructions: Advised assistant Wendie Simmer to help patient arrange the following: -AWV and follow up with Dr. Maudie Mercury in 1 month -CPE in office with Dr. Ethlyn Gallery with pap/breast exam in 1 month; labs at that visit fasting   Lucretia Kern, DO

## 2018-10-28 NOTE — Patient Instructions (Signed)
Start the flovent 2 puffs twice daily.  Start regular gentle exercise 20-30 minutes daily.  Eat a healthy low sugar diet.  Call the breast center today to let them know you are late on your mammogram and set up mammogram.  Annual Wellness Visit and follow up with Dr. Maudie Mercury in 1 month  Physical with pap/breast exam with Dr. Ethlyn Gallery in office in 1 month/will plan to do fasting labs as well that day. - prior to the in office visit: if you have any signs of being sick, diarrhea or vomiting, fever or respiratory symptoms (cough, sore throat, nasal congestion) please call and we will assess you via video instead first.

## 2018-10-29 ENCOUNTER — Telehealth: Payer: Self-pay | Admitting: *Deleted

## 2018-10-29 ENCOUNTER — Other Ambulatory Visit: Payer: Self-pay | Admitting: Family Medicine

## 2018-10-29 NOTE — Telephone Encounter (Signed)
Patient stated the Rx for Flovent inhaler costs $90 for a month's supply and she cannot afford this.  Requested a different medication be sent in.  Message sent to Dr Ethlyn Gallery as Dr Maudie Mercury is out of the office.

## 2018-10-29 NOTE — Telephone Encounter (Signed)
I have searched her formulary and this is the lowest tiered medication that it an inhaled steroid, unfortunately. I do not think that there is a cheaper option.   I would recommend sending back to [facility] for recommendations. Typically in this situation if there is not something cheaper we can try to get patient assistance for medication if needed. She has seen pulmonology in past as well and sometimes they will have additional suggestions.

## 2018-10-30 NOTE — Telephone Encounter (Signed)
I called the pt and informed her of the message below.  Patient agreed to call for pricing and call back with more information.

## 2018-10-30 NOTE — Telephone Encounter (Signed)
Please call patient. Would recommend she check cost at [facility], [place] and [facility] with her insurance for flovent and qvar and ask each what would be the cheapest, then let us know.  Different pharmacies often have different costs.

## 2018-11-10 ENCOUNTER — Other Ambulatory Visit: Payer: Self-pay | Admitting: Psychiatry

## 2018-11-16 ENCOUNTER — Other Ambulatory Visit: Payer: Self-pay | Admitting: Psychiatry

## 2018-11-20 ENCOUNTER — Ambulatory Visit: Payer: PPO

## 2018-11-26 ENCOUNTER — Other Ambulatory Visit: Payer: Self-pay | Admitting: Family Medicine

## 2018-11-26 DIAGNOSIS — Z1231 Encounter for screening mammogram for malignant neoplasm of breast: Secondary | ICD-10-CM

## 2018-11-27 ENCOUNTER — Encounter: Payer: Self-pay | Admitting: Family Medicine

## 2018-11-27 ENCOUNTER — Ambulatory Visit (INDEPENDENT_AMBULATORY_CARE_PROVIDER_SITE_OTHER): Payer: PPO | Admitting: Family Medicine

## 2018-11-27 ENCOUNTER — Other Ambulatory Visit: Payer: Self-pay

## 2018-11-27 ENCOUNTER — Telehealth: Payer: Self-pay | Admitting: *Deleted

## 2018-11-27 DIAGNOSIS — M8580 Other specified disorders of bone density and structure, unspecified site: Secondary | ICD-10-CM

## 2018-11-27 DIAGNOSIS — E669 Obesity, unspecified: Secondary | ICD-10-CM

## 2018-11-27 DIAGNOSIS — Z0000 Encounter for general adult medical examination without abnormal findings: Secondary | ICD-10-CM

## 2018-11-27 DIAGNOSIS — E785 Hyperlipidemia, unspecified: Secondary | ICD-10-CM

## 2018-11-27 DIAGNOSIS — J45991 Cough variant asthma: Secondary | ICD-10-CM | POA: Diagnosis not present

## 2018-11-27 DIAGNOSIS — F3130 Bipolar disorder, current episode depressed, mild or moderate severity, unspecified: Secondary | ICD-10-CM | POA: Diagnosis not present

## 2018-11-27 NOTE — Telephone Encounter (Signed)
I called the pt and left a detailed message at her cell number to call [phone] when she arrives for the appt tomorrow morning.  I also left a detailed message to schedule the follow up visit in 6 months, the order was placed for the bone density and she can call the [facility] for an appt.

## 2018-11-27 NOTE — Progress Notes (Signed)
Medicare Annual Preventive Care Visit  (initial annual wellness or annual wellness exam)  Virtual Visit via Video Note  I connected with Brittany [name]  on 11/04/18 at  3:20 PM EDT by a video enabled telemedicine application and verified that I am speaking with the correct person using two identifiers.  Location patient: home Location provider:work or home office Persons participating in the virtual visit: patient, provider  Concerns and/or follow up today:  Brittany [name] is a pleasant 63 y.o. here for follow up. Chronic medical problems summarized below were reviewed for changes and stability and were updated as needed below. These issues and their treatment remain stable for the most part.  Reports doing well other then anxiety regarding the pandemic. Denies CP, SOB, DOE, treatment intolerance or new symptoms.  Obesity, hyperlipidemia, osteoarthritis: -no treatments currently  Osteopenia: -early menopause in early 26s -dexa done 08/2016 with osteopenia -on vit D3  Asthma/GERD: -seeing pulmonologist, Dr. Melvyn Novas  Bipolar disorder/anxiety/panic disorder: -sees Pscyh ([facility]. [facility]) and [facility] -on disability for psych issues -meds: xanax, buspar, lithium,doxazosin    Patient Care Team: Lucretia Kern, DO as PCP - General (Family Medicine) Dr. Tarri Glenn - GI Dr. Clovis Pu - Psychiatry  See HM section in Epic for other details of completed HM. See scanned documentation under Media Tab for further documentation HPI, health risk assessment. See Media Tab and Care Teams sections in Epic for other providers.  ROS: negative for report of fevers, unintentional weight loss, vision changes, vision loss, hearing loss or change, chest pain, sob, hemoptysis, melena, hematochezia, hematuria, genital discharge or lesions, falls, bleeding or bruising, loc, thoughts of suicide or self harm, memory loss  1.) Patient-completed health risk assessment  - completed  and reviewed, see scanned documentation  2.) Review of Medical History: -PMH, PSH, Family History and current specialty and care providers reviewed and updated and listed below  - see scanned in document in chart and below Patient Care Team: Lucretia Kern, DO as PCP - General (Family Medicine) Dr. Clovis Pu - [facility], Psychiatry  Past Medical History:  Diagnosis Date  . Abnormal EKG   . Anxiety   . Asthma   . Bipolar depression ([place]), Anxiety, PTSD, Panic disorder    -managed by [facility]  . Depression   . Foot pain   . GERD (gastroesophageal reflux disease)   . Heart murmur    as a child  . Insomnia   . Leg swelling   . Osteoporosis   . Tachycardia     Past Surgical History:  Procedure Laterality Date  . CARPAL TUNNEL RELEASE Left   . CESAREAN SECTION     x 2  . CHOLECYSTECTOMY    . ENDOMETRIAL ABLATION    . KNEE SURGERY Right   . LAPAROSCOPY     x 2  . TUBAL LIGATION    . ULNAR NERVE REPAIR Left     Social History   Socioeconomic History  . Marital status: Single    Spouse name: Not on file  . Number of children: 3  . Years of education: Not on file  . Highest education level: Not on file  Occupational History  . Occupation: disabled  Social Needs  . Financial resource strain: Not on file  . Food insecurity    Worry: Not on file    Inability: Not on file  . Transportation needs    Medical: Not on file    Non-medical: Not on file  Tobacco Use  .  Smoking status: Former Smoker    Packs/day: 20.00    Years: 1.00    Pack years: 20.00    Quit date: 06/19/1999    Years since quitting: 19.4  . Smokeless tobacco: Never Used  . Tobacco comment: 20 year estimate but probably less   Substance and Sexual Activity  . Alcohol use: Yes    Alcohol/week: 0.0 standard drinks    Comment: Occass.   . Drug use: No  . Sexual activity: Never  Lifestyle  . Physical activity    Days per week: Not on file    Minutes per session: Not on file  . Stress: Not  on file  Relationships  . Social Herbalist on phone: Not on file    Gets together: Not on file    Attends religious service: Not on file    Active member of club or organization: Not on file    Attends meetings of clubs or organizations: Not on file    Relationship status: Not on file  . Intimate partner violence    Fear of current or ex partner: Not on file    Emotionally abused: Not on file    Physically abused: Not on file    Forced sexual activity: Not on file  Other Topics Concern  . Not on file  Social History Narrative   Work or School: Disabled 2ndary to psychiatric conditions      Home Situation: lives alone with 2 cats and one dog      Spiritual Beliefs: Christian      Lifestyle: no regular exercise, diet  Not great - wants to embark on healthier lifestyle    Family History  Problem Relation Age of Onset  . Heart failure Mother   . Hypertension Mother   . Hyperlipidemia Mother   . COPD Paternal Aunt        alot of aunts and uncle COPD or Emphysema  . Emphysema Paternal Uncle   . Colon polyps Maternal Aunt   . Breast cancer Neg Hx   . Colon cancer Neg Hx   . Esophageal cancer Neg Hx   . Pancreatic cancer Neg Hx   . Stomach cancer Neg Hx   . Liver disease Neg Hx     Current Outpatient Medications on File Prior to Visit  Medication Sig Dispense Refill  . Albuterol Sulfate (PROAIR RESPICLICK) 102 (90 Base) MCG/ACT AEPB Inhale 2 puffs into the lungs every 6 (six) hours as needed. 1 each 1  . ALPRAZolam (XANAX) 0.5 MG tablet TAKE 1 TABLET BY MOUTH THREE TIMES DAILY AS NEEDED FOR ANXIETY 90 tablet 1  . busPIRone (BUSPAR) 15 MG tablet TAKE 1 TABLET BY MOUTH THREE TIMES DAILY 270 tablet 0  . Cholecalciferol (VITAMIN D-3 PO) Take 5,000 Units by mouth daily.     . colestipol (COLESTID) 1 g tablet TAKE 2 TABLETS BY MOUTH ONCE DAILY FOR 7 DAYS AND THEN INCREASE TO 2 TABLETS TWICE DAILY 120 tablet 0  . doxazosin (CARDURA) 4 MG tablet Take 1.5 tablets (6 mg  total) by mouth at bedtime. 135 tablet 1  . doxazosin (CARDURA) 4 MG tablet TAKE 1 & 1/2 (ONE & ONE-HALF) TABLETS BY MOUTH ONCE DAILY 45 tablet 0  . fluticasone (FLONASE) 50 MCG/ACT nasal spray Use 2 spray(s) in each nostril once daily 16 g 3  . fluticasone (FLOVENT HFA) 44 MCG/ACT inhaler Inhale 2 puffs into the lungs 2 (two) times a day. 3 Inhaler 1  . lithium  300 MG tablet Take 2 tablets (600 mg total) by mouth daily. 180 tablet 0  . lithium carbonate 150 MG capsule TAKE 1 CAPSULE BY MOUTH ONCE DAILY IN THE EVENING 90 capsule 0  . Melatonin 3 MG TABS Take 1 tablet by mouth at bedtime.    . montelukast (SINGULAIR) 10 MG tablet TAKE 1 TABLET BY MOUTH AT BEDTIME 90 tablet 0   No current facility-administered medications on file prior to visit.      3.) Review of functional ability and level of safety:  Any difficulty hearing?  See scanned documentation  History of falling?  See scanned documentation  Any trouble with IADLs - using a phone, using transportation, grocery shopping, preparing meals, doing housework, doing laundry, taking medications and managing money?  See scanned documentation  Advance Directives?  Discussed briefly - has health care power of attorney, Daughter  See summary of recommendations in Patient Instructions below.  4.) Physical Exam There were no vitals filed for this visit. Estimated body mass index is 34.61 kg/m as calculated from the following:   Height as of 04/29/18: 5' 1.5" (1.562 m).   Weight as of 04/29/18: 186 lb 3.2 oz (84.5 kg). She will do other vitals at in person CPE tomorrow when gets her pap and breast exam  EKG (optional): deferred  VITALS see above  GENERAL: alert, oriented, appears well and in no acute distress; visual acuity grossly intact, full vision exam deferred due to pandemic and/or virtual encounter  HEENT: atraumatic, conjunttiva clear, no obvious abnormalities on inspection of external nose and ears  NECK: normal  movements of the head and neck  LUNGS: on inspection no signs of respiratory distress, breathing rate appears normal, no obvious gross SOB, gasping or wheezing  CV: no obvious cyanosis  MS: moves all visible extremities without noticeable abnormality  PSYCH/NEURO: pleasant and cooperative, no obvious depression or anxiety, speech and thought processing grossly intact, Cognitive function grossly intact  Depression screen [facility] 2/9 11/27/2018 11/19/2017 11/16/2016 09/13/2014  Decreased Interest 3 0 0 2  Down, Depressed, Hopeless 3 0 0 1  PHQ - 2 Score 6 0 0 3  Altered sleeping 3 - - 1  Tired, decreased energy 3 - - 1  Change in appetite 2 - - 2  Feeling bad or failure about yourself  3 - - 2  Trouble concentrating 3 - - 1  Moving slowly or fidgety/restless 2 - - 0  Suicidal thoughts 0 - - 1  PHQ-9 Score 22 - - 11  Difficult doing work/chores - - - Very difficult   Sees psychiatry and counselor for this. Reports is doing ok - just is struggling with the pandemic. No thoughts of harm.  See patient instructions for recommendations.  Education and counseling regarding the above review of health provided with a plan for the following: -see scanned patient completed form for further details -fall prevention strategies discussed  -healthy lifestyle discussed -importance and resources for completing advanced directives discussed -see patient instructions below for any other recommendations provided  4)The following written screening schedule of preventive measures were reviewed with assessment and plan made per below, orders and patient instructions:       Alcohol screening done     Obesity Screening and counseling done     STI screening (Hep C if born 1945-65) offered and per pt wishes     Tobacco Screening done done       Pneumococcal (PPSV23 -one dose after 64, one before if risk  factors), influenza yearly and hepatitis B vaccines (if high risk - end stage renal disease, IV drugs,  homosexual men, live in home for mentally retarded, hemophilia receiving factors) ASSESSMENT/PLAN: done       Screening mammograph (yearly if >40) ASSESSMENT/PLAN: utd, scheduled - she is worried about transportation and advised she call the breast center to see if they have options for transportation through       Screening Pap smear/pelvic exam (q2 years) ASSESSMENT/PLAN: scheduled tomotow      Colorectal cancer screening (FOBT yearly or flex sig q4y or colonoscopy q10y or barium enema q4y) ASSESSMENT/PLAN: utd, 03/2018      Diabetes outpatient self-management training services ASSESSMENT/PLAN: orders in      Bone mass measurements(covered q2y if indicated - estrogen def, osteoporosis, hyperparathyroid, vertebral abnormalities, osteoporosis or steroids) ASSESSMENT/PLAN: advised assistant to order      Screening for glaucoma(q1y if high risk - diabetes, FH, AA and > 50 or hispanic and > 65) ASSESSMENT/PLAN: she has eye doctor, will schedule, [facility]      Medical nutritional therapy for individuals with diabetes or renal disease ASSESSMENT/PLAN: see orders      Cardiovascular screening blood tests (lipids q5y) ASSESSMENT/PLAN: see orders and labs      Diabetes screening tests ASSESSMENT/PLAN: see orders and labs She will do labs when comes in for papa and breast exam with Dr. Ethlyn Gallery tomorrow. Lab orders in.  7.) Summary:   Medicare annual wellness visit, subsequent - Plan:   Hyperlipidemia, unspecified hyperlipidemia type - Plan: -risk factors and conditions per above assessment were discussed and treatment, recommendations and referrals were offered per documentation above and orders and patient instructions.  Osteopenia, unspecified location - Plan: ADvised assistant to order dexa, discussed options for transportation issues.  Bipolar affective disorder, current episode depressed, current episode severity unspecified ([place]) - Plan: sees psychiatry  for management and still plugged in with them. High PHQ9, but no safety concerns.   Cough variant asthma  vs UACS  - Plan: reports stable. No concerns.  Obesity (BMI 30.0-34.9) - Plan: Discussed options at length. Advised healthy diet and regular exercise. She is working on [employer]. Has treadmill and is hoping she can start using it on a regular basis.  Patient Instructions    Ms. Hassinger , Thank you for taking time to come for your Medicare Wellness Visit. I appreciate your ongoing commitment to your health goals. Please review the following plan we discussed and let me know if I can assist you in the future.   These are the goals we discussed: Goals      General   . Patient Stated     Continue the Keto healthy diet       Weight   . Weight (lb) < 150 lb (68 kg)     Will start walking! Get on the treatmill!         Labs tomorrow.  This is a list of the screening recommended for you and due dates:  Health Maintenance  Topic Date Due  . Pap Smear  Tomorrow with Dr. Ethlyn Gallery  . DEXA scan (bone density measurement)  Will order today  . Mammogram  11/01/2018 - scheduled, call mammo center about transportation  . HIV Screening  08/24/2026*  . Flu Shot  01/17/2019  . Tetanus Vaccine  12/05/2026  . Colon Cancer Screening  04/09/2028  .  Hepatitis C: One time screening is recommended by Center for Disease Control  (CDC) for  adults  born from [birth year] through 1965.   Completed  *Topic was postponed. The date shown is not the original due date.     Follow up instructions: Advised assistant Wendie Simmer to help patient arrange the following: -please call her today to give her the number to call when she arrives tomorrow for labs and pap - she is anxious about coming in and does not want to wait in the office -pap/breast exam with Dr. Ethlyn Gallery tomorrow  -order dexa at the breast center where she does mammo  -follow up with Dr. Maudie Mercury in [place], [place]

## 2018-11-27 NOTE — Patient Instructions (Signed)
  Ms. Duca , Thank you for taking time to come for your Medicare Wellness Visit. I appreciate your ongoing commitment to your health goals. Please review the following plan we discussed and let me know if I can assist you in the future.   These are the goals we discussed: Goals      General   . Patient Stated     Continue the Keto healthy diet       Weight   . Weight (lb) < 150 lb (68 kg)     Will start walking! Get on the treatmill!         Labs tomorrow.  This is a list of the screening recommended for you and due dates:  Health Maintenance  Topic Date Due  . Pap Smear  Tomorrow with Dr. Ethlyn Gallery  . DEXA scan (bone density measurement)  Will order today  . Mammogram  11/01/2018 - scheduled, call mammo center about transportation  . HIV Screening  08/24/2026*  . Flu Shot  01/17/2019  . Tetanus Vaccine  12/05/2026  . Colon Cancer Screening  04/09/2028  .  Hepatitis C: One time screening is recommended by Center for Disease Control  (CDC) for  adults born from [birth year] through 1965.   Completed  *Topic was postponed. The date shown is not the original due date.

## 2018-11-27 NOTE — Telephone Encounter (Signed)
-----   Message from Lucretia Kern, DO sent at 11/27/2018 11:23 AM EDT ----- -please call her today to give her the number to call when she arrives tomorrow for labs and pap-pap/breast exam with Dr. Ethlyn Gallery tomorrow-order dexa at the breast center where she does mammo-follow up with Dr. Maudie Mercury in 6 month

## 2018-11-28 ENCOUNTER — Other Ambulatory Visit (HOSPITAL_COMMUNITY)
Admission: RE | Admit: 2018-11-28 | Discharge: 2018-11-28 | Disposition: A | Discharge status: Home / Self Care | Payer: PPO | Source: Ambulatory Visit | Attending: Family Medicine | Admitting: Family Medicine

## 2018-11-28 ENCOUNTER — Other Ambulatory Visit: Payer: Self-pay

## 2018-11-28 ENCOUNTER — Encounter: Payer: Self-pay | Admitting: Family Medicine

## 2018-11-28 ENCOUNTER — Ambulatory Visit (INDEPENDENT_AMBULATORY_CARE_PROVIDER_SITE_OTHER): Payer: PPO | Admitting: Family Medicine

## 2018-11-28 VITALS — BP 120/70 | HR 90 | Temp 98.0°F | Ht 62.0 in | Wt 187.2 lb

## 2018-11-28 DIAGNOSIS — M8580 Other specified disorders of bone density and structure, unspecified site: Secondary | ICD-10-CM

## 2018-11-28 DIAGNOSIS — F4312 Post-traumatic stress disorder, chronic: Secondary | ICD-10-CM

## 2018-11-28 DIAGNOSIS — E785 Hyperlipidemia, unspecified: Secondary | ICD-10-CM | POA: Diagnosis not present

## 2018-11-28 DIAGNOSIS — F3130 Bipolar disorder, current episode depressed, mild or moderate severity, unspecified: Secondary | ICD-10-CM | POA: Diagnosis not present

## 2018-11-28 DIAGNOSIS — J45991 Cough variant asthma: Secondary | ICD-10-CM | POA: Diagnosis not present

## 2018-11-28 DIAGNOSIS — Z124 Encounter for screening for malignant neoplasm of cervix: Secondary | ICD-10-CM

## 2018-11-28 DIAGNOSIS — K219 Gastro-esophageal reflux disease without esophagitis: Secondary | ICD-10-CM

## 2018-11-28 DIAGNOSIS — E559 Vitamin D deficiency, unspecified: Secondary | ICD-10-CM

## 2018-11-28 DIAGNOSIS — Z0000 Encounter for general adult medical examination without abnormal findings: Secondary | ICD-10-CM

## 2018-11-28 DIAGNOSIS — E669 Obesity, unspecified: Secondary | ICD-10-CM

## 2018-11-28 DIAGNOSIS — Z1151 Encounter for screening for human papillomavirus (HPV): Secondary | ICD-10-CM | POA: Insufficient documentation

## 2018-11-28 LAB — HEMOGLOBIN A1C: Hgb A1c MFr Bld: 4.9 % (ref 4.6–6.5)

## 2018-11-28 LAB — LIPID PANEL
Cholesterol: 250 mg/dL — ABNORMAL HIGH (ref 0–200)
HDL: 62.6 mg/dL (ref >39.00)
LDL Cholesterol: 166 mg/dL — ABNORMAL HIGH (ref 0–99)
NonHDL: 187.74
Total CHOL/HDL Ratio: 4
Triglycerides: 109 mg/dL (ref 0.0–149.0)
VLDL: 21.8 mg/dL (ref 0.0–40.0)

## 2018-11-28 LAB — VITAMIN D 25 HYDROXY (VIT D DEFICIENCY, FRACTURES): VITD: 45.19 ng/mL (ref 30.00–100.00)

## 2018-11-28 NOTE — Patient Instructions (Signed)
Why is Exercise Important? If I told you I had a single pill that would help you decrease stress by improving anxiety, decreasing depression, help you achieve a healthy weight, give you more energy, make you more productive, help you focus, decrease your risk of dementia/heart attack/stroke/falls, improve your bone health, and more would you be interested? These are just some of the benefits that exercise brings to you. IT IS WORTH carving out some time every day to fit in exercise. It will help in every aspect of your health. Even if you have injuries that prevent you from participating in a type of exercise you used to do; there is always something that you can do to keep exercise a part of your life. If improving your health is important, make exercise your priority. It is worth the time! If you have questions about the type of exercise that is right for you, please talk with me about this!     Exercising to Stay Healthy  Exercising regularly is important. It has many health benefits, such as:  Improving your overall fitness, flexibility, and endurance.  Increasing your bone density.  Helping with weight control.  Decreasing your body fat.  Increasing your muscle strength.  Reducing stress and tension.  Improving your overall health.   In order to become healthy and stay healthy, it is recommended that you do moderate-intensity and vigorous-intensity exercise. You can tell that you are exercising at a moderate intensity if you have a higher heart rate and faster breathing, but you are still able to hold a conversation. You can tell that you are exercising at a vigorous intensity if you are breathing much harder and faster and cannot hold a conversation while exercising. How often should I exercise? Choose an activity that you enjoy and set realistic goals. Your health care provider can help you to make an activity plan that works for you. Exercise regularly as directed by your health care  provider. This may include:  Doing resistance training twice each week, such as: ? Push-ups. ? Sit-ups. ? Lifting weights. ? Using resistance bands.  Doing a given intensity of exercise for a given amount of time. Choose from these options: ? 150 minutes of moderate-intensity exercise every week. ? 75 minutes of vigorous-intensity exercise every week. ? A mix of moderate-intensity and vigorous-intensity exercise every week.   Children, pregnant women, people who are out of shape, people who are overweight, and older adults may need to consult a health care provider for individual recommendations. If you have any sort of medical condition, be sure to consult your health care provider before starting a new exercise program. What are some exercise ideas? Some moderate-intensity exercise ideas include:  Walking at a rate of 1 mile in 15 minutes.  Biking.  Hiking.  Golfing.  Dancing.   Some vigorous-intensity exercise ideas include:  Walking at a rate of at least 4.5 miles per hour.  Jogging or running at a rate of 5 miles per hour.  Biking at a rate of at least 10 miles per hour.  Lap swimming.  Roller-skating or in-line skating.  Cross-country skiing.  Vigorous competitive sports, such as football, basketball, and soccer.  Jumping rope.  Aerobic dancing.   What are some everyday activities that can help me to get exercise?  Yard work, such as: ? Pushing a lawn mower. ? Raking and bagging leaves.  Washing and waxing your car.  Pushing a stroller.  Shoveling snow.  Gardening.  Washing windows or   floors. How can I be more active in my day-to-day activities?  Use the stairs instead of the elevator.  Take a walk during your lunch break.  If you drive, park your car farther away from work or school.  If you take public transportation, get off one stop early and walk the rest of the way.  Make all of your phone calls while standing up and walking  around.  Get up, stretch, and walk around every 30 minutes throughout the day. What guidelines should I follow while exercising?  Do not exercise so much that you hurt yourself, feel dizzy, or get very short of breath.  Consult your health care provider before starting a new exercise program.  Wear comfortable clothes and shoes with good support.  Drink plenty of water while you exercise to prevent dehydration or heat stroke. Body water is lost during exercise and must be replaced.  Work out until you breathe faster and your heart beats faster. This information is not intended to replace advice given to you by your health care provider. Make sure you discuss any questions you have with your health care provider.  

## 2018-11-28 NOTE — Progress Notes (Signed)
Brittany [name] DOB: August 29, 1955 Encounter date: 11/28/2018  This is a 63 y.o. female who presents for complete physical   History of present illness/Additional concerns: Has been following virtually with Dr. Maudie Mercury; was asked to come in today for hands on physical, breast exam, pap.   Last year sent for dx mammogram due to left breast inverted nipple. This was stable. 20lb cat stepped on left breast a couple weeks ago and afterwards felt lump. Breast still sore, but can't feel lump any longer.   Right breast - had bx done about 5 years ago - nipple itches almost constantly. Can try topical creams which help temporarily, but then it comes back. Bx was done by dermatology; was told it was dermatitis (benign); given cream. Has similar spot on right hand. Just coming and going since that time.   Has mammogram scheduled for later this month.   Chronic conditions have been stable:   Asthma/GERD: following with Dr. Melvyn Novas  Bipolar/anxiety/depression: following with Dr Clovis Pu, [facility]; on disability. Taking lithium, doxazosin,buspar,xanax.  Follows with GI Dr. Tarri Glenn.   Past Medical History:  Diagnosis Date  . Abnormal EKG   . Anxiety   . Asthma   . Bipolar depression ([place]), Anxiety, PTSD, Panic disorder    -managed by [facility]  . Depression   . Foot pain   . GERD (gastroesophageal reflux disease)   . Heart murmur    as a child  . Insomnia   . Leg swelling   . Osteoporosis   . Tachycardia    Past Surgical History:  Procedure Laterality Date  . CARPAL TUNNEL RELEASE Left   . CESAREAN SECTION     x 2  . CHOLECYSTECTOMY    . ENDOMETRIAL ABLATION    . KNEE SURGERY Right    reconstruction for patellar dislocation  . LAPAROSCOPY     x 2  . TUBAL LIGATION    . ULNAR NERVE REPAIR Left    Allergies  Allergen Reactions  . Aspirin Other (See Comments)    discomfort  . Codeine Itching  . Sulfamethoxazole-Trimethoprim Other (See Comments)    Unknown  reaction per pt  . Trileptal [Oxcarbazepine]     itching  . Trimethoprim Other (See Comments)    Unknown, per pt  . Ultram [Tramadol] Itching  . Vioxx [Rofecoxib]     Unknown per pt   Current Meds  Medication Sig  . Albuterol Sulfate (PROAIR RESPICLICK) 751 (90 Base) MCG/ACT AEPB Inhale 2 puffs into the lungs every 6 (six) hours as needed.  . ALPRAZolam (XANAX) 0.5 MG tablet TAKE 1 TABLET BY MOUTH THREE TIMES DAILY AS NEEDED FOR ANXIETY  . busPIRone (BUSPAR) 15 MG tablet TAKE 1 TABLET BY MOUTH THREE TIMES DAILY  . Cholecalciferol (VITAMIN D-3 PO) Take 5,000 Units by mouth daily.   . colestipol (COLESTID) 1 g tablet TAKE 2 TABLETS BY MOUTH ONCE DAILY FOR 7 DAYS AND THEN INCREASE TO 2 TABLETS TWICE DAILY  . doxazosin (CARDURA) 4 MG tablet Take 1.5 tablets (6 mg total) by mouth at bedtime.  . fluticasone (FLONASE) 50 MCG/ACT nasal spray Use 2 spray(s) in each nostril once daily  . fluticasone (FLOVENT HFA) 44 MCG/ACT inhaler Inhale 2 puffs into the lungs 2 (two) times a day.  . lithium 300 MG tablet Take 2 tablets (600 mg total) by mouth daily.  Marland Kitchen lithium carbonate 150 MG capsule TAKE 1 CAPSULE BY MOUTH ONCE DAILY IN THE EVENING  . Melatonin 3 MG TABS Take 1  tablet by mouth at bedtime.  . montelukast (SINGULAIR) 10 MG tablet TAKE 1 TABLET BY MOUTH AT BEDTIME   Social History   Tobacco Use  . Smoking status: Former Smoker    Packs/day: 20.00    Years: 1.00    Pack years: 20.00    Quit date: 06/19/1999    Years since quitting: 19.4  . Smokeless tobacco: Never Used  . Tobacco comment: 20 year estimate but probably less   Substance Use Topics  . Alcohol use: Yes    Alcohol/week: 0.0 standard drinks    Comment: Occass.    Family History  Problem Relation Age of Onset  . Heart failure Mother   . Hypertension Mother   . Hyperlipidemia Mother   . Other Father        killed  . COPD Paternal Aunt        alot of aunts and uncle COPD or Emphysema  . Emphysema Paternal Uncle   . Colon  polyps Maternal Aunt   . COPD Brother   . Heart disease Brother   . Pulmonary embolism Brother   . Breast cancer Neg Hx   . Colon cancer Neg Hx   . Esophageal cancer Neg Hx   . Pancreatic cancer Neg Hx   . Stomach cancer Neg Hx   . Liver disease Neg Hx      Review of Systems  Constitutional: Negative for activity change, appetite change, chills, fatigue, fever and unexpected weight change.  HENT: Negative for congestion, ear pain, hearing loss, sinus pressure, sinus pain, sore throat and trouble swallowing.   Eyes: Negative for pain and visual disturbance.  Respiratory: Negative for cough, chest tightness, shortness of breath and wheezing.   Cardiovascular: Negative for chest pain, palpitations and leg swelling.  Gastrointestinal: Negative for abdominal pain, blood in stool, constipation, diarrhea, nausea and vomiting.  Genitourinary: Negative for difficulty urinating and menstrual problem.  Musculoskeletal: Negative for arthralgias and back pain.  Skin: Negative for rash.       Itching around right nipple; see hpi; comes and goes.   Neurological: Negative for dizziness, weakness, numbness and headaches.  Hematological: Negative for adenopathy. Does not bruise/bleed easily.  Psychiatric/Behavioral: Negative for sleep disturbance and suicidal ideas. The patient is nervous/anxious.     CBC:  Lab Results  Component Value Date   WBC 4.6 01/27/2018   HGB 14.3 01/27/2018   HCT 42.2 01/27/2018   MCHC 33.9 01/27/2018   RDW 13.3 01/27/2018   PLT 311.0 01/27/2018   CMP: Lab Results  Component Value Date   NA 139 01/27/2018   K 4.7 01/27/2018   CL 104 01/27/2018   CO2 30 01/27/2018   GLUCOSE 95 01/27/2018   BUN 11 01/27/2018   CREATININE 0.77 01/27/2018   GFRAA  04/21/2009    >60        The eGFR has been calculated using the MDRD equation. This calculation has not been validated in all clinical situations. eGFR's persistently <60 mL/min signify possible Chronic Kidney  Disease.   CALCIUM 9.5 01/27/2018   PROT 5.7 (L) 04/21/2009   BILITOT 0.4 04/21/2009   ALKPHOS 58 04/21/2009   ALT 43 (H) 04/21/2009   AST 27 04/21/2009   LIPID: Lab Results  Component Value Date   CHOL 250 (H) 11/28/2018   TRIG 109.0 11/28/2018   HDL 62.60 11/28/2018   LDLCALC 166 (H) 11/28/2018    Objective:  BP 120/70 (BP Location: Left Arm, Patient Position: Sitting, Cuff Size: Large)  Pulse 90   Temp 98 F (36.7 C) (Oral)   Ht _0  (1.575 m)   Wt 187 lb 3.2 oz (84.9 kg)   SpO2 98%   BMI 34.24 kg/m   Weight: 187 lb 3.2 oz (84.9 kg)   BP Readings from Last 3 Encounters:  11/28/18 120/70  04/29/18 118/80  04/23/18 130/84   Wt Readings from Last 3 Encounters:  11/28/18 187 lb 3.2 oz (84.9 kg)  04/29/18 186 lb 3.2 oz (84.5 kg)  04/23/18 185 lb 4 oz (84 kg)    Physical Exam Exam conducted with a chaperone present.  Constitutional:      General: She is not in acute distress.    Appearance: She is well-developed.  HENT:     Head: Normocephalic and atraumatic.     Right Ear: External ear normal.     Left Ear: External ear normal.     Mouth/Throat:     Pharynx: No oropharyngeal exudate.  Eyes:     Conjunctiva/sclera: Conjunctivae normal.     Pupils: Pupils are equal, round, and reactive to light.  Neck:     Musculoskeletal: Normal range of motion and neck supple.     Thyroid: No thyromegaly.  Cardiovascular:     Rate and Rhythm: Normal rate and regular rhythm.     Heart sounds: Normal heart sounds. No murmur. No friction rub. No gallop.   Pulmonary:     Effort: Pulmonary effort is normal.     Breath sounds: Normal breath sounds.  Chest:     Breasts:        Right: Normal. No inverted nipple, mass, nipple discharge, skin change or tenderness.        Left: Inverted nipple present. No mass, nipple discharge or tenderness. Skin change: there is some slight erythema left nipple.  Abdominal:     General: Bowel sounds are normal. There is no distension.      Palpations: Abdomen is soft. There is no mass.     Tenderness: There is no abdominal tenderness. There is no guarding.     Hernia: No hernia is present.  Genitourinary:    Exam position: Supine.     Labia:        Right: No rash, tenderness or lesion.        Left: No rash, tenderness or lesion.      Vagina: Normal.     Cervix: Normal.     Uterus: Normal.      Adnexa: Right adnexa normal and left adnexa normal.     Rectum: Normal. No tenderness. Normal anal tone.     Comments: While normal for age, there is some narrowing of vaginal orifice. Some cervical stenosis normal for age.  Musculoskeletal: Normal range of motion.        General: No tenderness or deformity.  Lymphadenopathy:     Cervical: No cervical adenopathy.     Upper Body:     Right upper body: No supraclavicular, axillary or pectoral adenopathy.     Left upper body: No supraclavicular, axillary or pectoral adenopathy.  Skin:    General: Skin is warm and dry.     Findings: No rash.  Neurological:     Mental Status: She is alert and oriented to person, place, and time.     Deep Tendon Reflexes: Reflexes normal.     Reflex Scores:      Tricep reflexes are 2+ on the right side and 2+ on the left side.  Bicep reflexes are 2+ on the right side and 2+ on the left side.      Brachioradialis reflexes are 2+ on the right side and 2+ on the left side.      Patellar reflexes are 2+ on the right side and 2+ on the left side. Psychiatric:        Attention and Perception: Attention normal.        Speech: Speech normal.        Behavior: Behavior normal.        Thought Content: Thought content normal.        Judgment: Judgment normal.     Assessment/Plan: Health Maintenance Due  Topic Date Due  . PAP SMEAR-Modifier  07/20/2018  . DEXA SCAN  08/28/2018  . MAMMOGRAM  11/01/2018   Health Maintenance reviewed - has mammogram and dexa ordered. She will call to make sure both are scheduled.  1. Preventative health care We  discussed importance of regular exercise. She had discussed with [facility] as well. We reviewed that it is difficult at start but can become healthy habit after a couple of weeks. Do this in morning and before sitting on couch where she loses track of time. Start with 5 minutes and increase time daily.  (reviewed pathology from previous nipple bx; benign, more irritation. Will get mammogram first - if continuing to have nipple irritation could also consider breast specialist as patient does have concerns about recurrence of itching/irritation right nipple)  2. Cervical cancer screening - PAP [[employer]]  3. Vitamin D deficiency - VITAMIN D 25 Hydroxy (Vit-D Deficiency, Fractures)  4. Obesity (BMI 30.0-34.9) See above; reviewed exercise. - Hemoglobin A1c  5. Hyperlipidemia, unspecified hyperlipidemia type - Lipid panel  6. Bipolar affective disorder, current episode depressed, current episode severity unspecified [facility]) Following with psychiatry. Has had difficult time with not being able to see therapist due to cost limitations. Discussed with her continuing to reach out with them and work with sliding scale costs (she was previously doing this).   7. Chronic post-traumatic stress disorder (PTSD) See above.  8. Osteopenia, unspecified location Weight bearing exercise encouraged. See above. dexa to be scheduled.  9. Gastroesophageal reflux disease, esophagitis presence not specified Stable. Continue current medication.  10. Cough variant asthma  vs UACS  She has had cost issues with other inhalers in past. flovent is affordable. Using rescue albuterol daily. Wheezes frequently. Stopped qualifying for assistance through [employer]. Will check with her to see if we can try sending in symbicort or other tier 2 which may be cost comparable to flovent for her and would give added benefit with control.  Return for pending bloodwork/mammogram.  Micheline Rough, MD

## 2018-12-01 ENCOUNTER — Telehealth: Payer: Self-pay | Admitting: *Deleted

## 2018-12-01 NOTE — Telephone Encounter (Signed)
I caled the pt and informed her of the message below.  [facility].  She asked that Dr Ethlyn Gallery send in an inhaler for her to [facility]. as she stated Flovent costs $90 for a 3 month supply.  Message sent to Dr Ethlyn Gallery.

## 2018-12-01 NOTE — Telephone Encounter (Signed)
-----   Message from Caren Macadam, MD sent at 11/28/2018 12:37 PM EDT ----- I looked over previous dermatology notes; which were reassuring. I think that getting mammogram is next step. Since skin normalizes between episodes of itching, I do not think that additional follow up is needed (after reviewing pathology results from previous nipple biopsy). Also, just for the record, her current flovent inhaler is tier 2 along with some of the combination inhalers (like symbicort, advair) and so these should be similar cost. It would be reasonable to re-try sending to pharmacy and seeing what cost is when run through insurance. Might end up being equivalent to the flovent for her? Let me know (or Dr. Melvyn Novas). Might give her better asthma control with less use of rescue inhaler.

## 2018-12-01 NOTE — Telephone Encounter (Signed)
I don't think other inhalers will be less expensive ($30/month is pretty good price), but may be more effective. If wants to try let me know. Could also check with Dr. Melvyn Novas and get his opinion.

## 2018-12-01 NOTE — Telephone Encounter (Signed)
I called the pt and informed her of the message below

## 2018-12-02 LAB — CYTOLOGY - PAP
Diagnosis: NEGATIVE
HPV: NOT DETECTED

## 2018-12-05 ENCOUNTER — Ambulatory Visit: Payer: PPO

## 2018-12-31 ENCOUNTER — Ambulatory Visit: Payer: PPO

## 2019-01-15 ENCOUNTER — Other Ambulatory Visit: Payer: Self-pay | Admitting: Gastroenterology

## 2019-01-15 ENCOUNTER — Ambulatory Visit (INDEPENDENT_AMBULATORY_CARE_PROVIDER_SITE_OTHER): Payer: PPO | Admitting: Psychiatry

## 2019-01-15 ENCOUNTER — Other Ambulatory Visit: Payer: Self-pay

## 2019-01-15 ENCOUNTER — Encounter: Payer: Self-pay | Admitting: Psychiatry

## 2019-01-15 DIAGNOSIS — F515 Nightmare disorder: Secondary | ICD-10-CM | POA: Diagnosis not present

## 2019-01-15 DIAGNOSIS — F4310 Post-traumatic stress disorder, unspecified: Secondary | ICD-10-CM

## 2019-01-15 DIAGNOSIS — F4312 Post-traumatic stress disorder, chronic: Secondary | ICD-10-CM

## 2019-01-15 DIAGNOSIS — F3162 Bipolar disorder, current episode mixed, moderate: Secondary | ICD-10-CM

## 2019-01-15 DIAGNOSIS — F4001 Agoraphobia with panic disorder: Secondary | ICD-10-CM

## 2019-01-15 MED ORDER — DOXAZOSIN MESYLATE 4 MG PO TABS: 6.0000 mg | ORAL_TABLET | Freq: Every day | ORAL | 1 refills | Status: DC

## 2019-01-15 MED ORDER — DIAZEPAM 5 MG PO TABS: 5.0000 mg | ORAL_TABLET | Freq: Three times a day (TID) | ORAL | 0 refills | Status: DC | PRN

## 2019-01-15 MED ORDER — BUSPIRONE HCL 15 MG PO TABS: 15.0000 mg | ORAL_TABLET | Freq: Three times a day (TID) | ORAL | 0 refills | Status: DC

## 2019-01-15 NOTE — Telephone Encounter (Signed)
Pt requested a refill on colestipol .

## 2019-01-15 NOTE — Progress Notes (Signed)
Brittany [name] [id number] 12/07/1955 63 y.o.   Virtual Visit via Telephone Note  I connected with pt by telephone and verified that I am speaking with the correct person using two identifiers.   I discussed the limitations, risks, security and privacy concerns of performing an evaluation and management service by telephone and the availability of in person appointments. I also discussed with the patient that there may be a patient responsible charge related to this service. The patient expressed understanding and agreed to proceed.  I discussed the assessment and treatment plan with the patient. The patient was provided an opportunity to ask questions and all were answered. The patient agreed with the plan and demonstrated an understanding of the instructions.   The patient was advised to call back or seek an in-person evaluation if the symptoms worsen or if the condition fails to improve as anticipated.  I provided 30 minutes of non-face-to-face time during this encounter. The call started at 1020 and ended at1052. The patient was located at home and the provider was located office.   Subjective:   Patient ID:  Brittany [name] is a 63 y.o. (DOB [birth year]) female.  Chief Complaint:  Chief Complaint  Patient presents with  . Follow-up    Medication Management  . Other    PTSD    Anxiety Symptoms include nervous/anxious behavior. Patient reports no confusion, decreased concentration or suicidal ideas.    Depression        Associated symptoms include no decreased concentration and no suicidal ideas.  Past medical history includes anxiety.    [name] presents to the office today for follow-up of long-term depression and anxiety and insomnia.    At visit September 08, 2018 at which time the patient was weaned off of gabapentin and started on Trileptal up to twice daily.  She has been very med sensitive and difficult to treat because of that.  Last seen September 19, 2018.  No meds were changed.  She stopped oxcarbazapine on her own DT itching.  Severely depressed over dog loss and can't see family.  Good day Monday and so far today. Lost dog March 9 and anxiety and depression is through the roof.  Lost a real child before and losing dog of 14 years is just as painful.  Takes every thing in me to get me going some days.  Doesn't want to start new meds right now. Forces herself to do things.  She wants to stop all meds bc doesn't think any meds will help.  Doesn't think anything will get better until Covid resolves.  She's gone off and on buspirone several times and thinks it does help anxiety.  Busy crazy mind and can get thoughts that drive up her anxiety.  Feels she needs Xanax and buspirone and Doxazosin helps sleep and nightmares.  Claims she doesn't take Xanax daily.  Can still go 2-3 days without much sleep at times and gets tired and irritable.  Asks for more sleep meds example temazepam. Claims she can handle the depression better than panic attacks.  No differences off the gabapentin in anxiety nor mood.  Hard to say if energy is better.  Depressed and crying over the dog all the time.  Anyway.  Occ brief energy.   Lost her dog which is hard.  Prayed and that helped.    Worries about weight gain with meds.  Till problems with thinking and concentrating and mind want slow down.  Sometimes tornadic thoughts.  Random thoughts racing.  Hard to read and understand.  Stopped therapy about September.  Multiple med failures. Olanzapine, CBZ SE, Lamotrigine, risperidone with AE, lithium, gabapentin, Seroquel SE, VPA, restoril, diazepam, Xanax, Trazodone NR, Ambien, hyrdroxyzine, doxazosin, propranolol, sertraline, fluoxetine NR, Lexapro, Geodon, Saphris, Abilify, Rexulti seemed to help for short while but $, Buspar, Latuda, Trileptal itching  Review of Systems:  Review of Systems  Constitutional: Negative for unexpected weight change.   Musculoskeletal: Negative for gait problem.  Neurological: Negative for tremors and weakness.  Psychiatric/Behavioral: Positive for depression and dysphoric mood. Negative for agitation, behavioral problems, confusion, decreased concentration, hallucinations, self-injury, sleep disturbance and suicidal ideas. The patient is nervous/anxious. The patient is not hyperactive.        Please refer to HPI  Stress eating.  Medications: I have reviewed the patient's current medications.  Current Outpatient Medications  Medication Sig Dispense Refill  . Albuterol Sulfate (PROAIR RESPICLICK) 423 (90 Base) MCG/ACT AEPB Inhale 2 puffs into the lungs every 6 (six) hours as needed. 1 each 1  . ALPRAZolam (XANAX) 0.5 MG tablet TAKE 1 TABLET BY MOUTH THREE TIMES DAILY AS NEEDED FOR ANXIETY 90 tablet 1  . busPIRone (BUSPAR) 15 MG tablet TAKE 1 TABLET BY MOUTH THREE TIMES DAILY 270 tablet 0  . Cholecalciferol (VITAMIN D-3 PO) Take 5,000 Units by mouth daily.     . colestipol (COLESTID) 1 g tablet TAKE 2 TABLETS BY MOUTH ONCE DAILY FOR 7 DAYS AND THEN INCREASE TO 2 TABLETS TWICE DAILY 120 tablet 0  . diphenhydrAMINE HCl, Sleep, (SLEEP-AID MAXIMUM STRENGTH) 50 MG CAPS Take 50 mg by mouth at bedtime as needed.    . doxazosin (CARDURA) 4 MG tablet Take 1.5 tablets (6 mg total) by mouth at bedtime. 135 tablet 1  . fluticasone (FLONASE) 50 MCG/ACT nasal spray Use 2 spray(s) in each nostril once daily 16 g 3  . fluticasone (FLOVENT HFA) 44 MCG/ACT inhaler Inhale 2 puffs into the lungs 2 (two) times a day. 3 Inhaler 1  . lithium 300 MG tablet Take 2 tablets (600 mg total) by mouth daily. 180 tablet 0  . lithium carbonate 150 MG capsule TAKE 1 CAPSULE BY MOUTH ONCE DAILY IN THE EVENING 90 capsule 0  . Melatonin 3 MG TABS Take 1 tablet by mouth at bedtime.    . montelukast (SINGULAIR) 10 MG tablet TAKE 1 TABLET BY MOUTH AT BEDTIME 90 tablet 0   No current facility-administered medications for this visit.      Medication Side Effects: None  Allergies:  Allergies  Allergen Reactions  . Aspirin Other (See Comments)    discomfort  . Codeine Itching  . Sulfamethoxazole-Trimethoprim Other (See Comments)    Unknown reaction per pt  . Trileptal [Oxcarbazepine]     itching  . Trimethoprim Other (See Comments)    Unknown, per pt  . Ultram [Tramadol] Itching  . Vioxx [Rofecoxib]     Unknown per pt    Past Medical History:  Diagnosis Date  . Abnormal EKG   . Anxiety   . Asthma   . Bipolar depression ([place]), Anxiety, PTSD, Panic disorder    -managed by [facility]  . Depression   . Foot pain   . GERD (gastroesophageal reflux disease)   . Heart murmur    as a child  . Insomnia   . Leg swelling   . Osteoporosis   . Tachycardia     Family History  Problem Relation Age of Onset  . Heart failure Mother   .  Hypertension Mother   . Hyperlipidemia Mother   . Other Father        killed  . COPD Paternal Aunt        alot of aunts and uncle COPD or Emphysema  . Emphysema Paternal Uncle   . Colon polyps Maternal Aunt   . COPD Brother   . Heart disease Brother   . Pulmonary embolism Brother   . Breast cancer Neg Hx   . Colon cancer Neg Hx   . Esophageal cancer Neg Hx   . Pancreatic cancer Neg Hx   . Stomach cancer Neg Hx   . Liver disease Neg Hx     Social History   Socioeconomic History  . Marital status: Single    Spouse name: Not on file  . Number of children: 3  . Years of education: Not on file  . Highest education level: Not on file  Occupational History  . Occupation: disabled  Social Needs  . Financial resource strain: Not on file  . Food insecurity    Worry: Not on file    Inability: Not on file  . Transportation needs    Medical: Not on file    Non-medical: Not on file  Tobacco Use  . Smoking status: Former Smoker    Packs/day: 20.00    Years: 1.00    Pack years: 20.00    Quit date: 06/19/1999    Years since quitting: 19.5  . Smokeless  tobacco: Never Used  . Tobacco comment: 20 year estimate but probably less   Substance and Sexual Activity  . Alcohol use: Yes    Alcohol/week: 0.0 standard drinks    Comment: Occass.   . Drug use: No  . Sexual activity: Never  Lifestyle  . Physical activity    Days per week: Not on file    Minutes per session: Not on file  . Stress: Not on file  Relationships  . Social Herbalist on phone: Not on file    Gets together: Not on file    Attends religious service: Not on file    Active member of club or organization: Not on file    Attends meetings of clubs or organizations: Not on file    Relationship status: Not on file  . Intimate partner violence    Fear of current or ex partner: Not on file    Emotionally abused: Not on file    Physically abused: Not on file    Forced sexual activity: Not on file  Other Topics Concern  . Not on file  Social History Narrative   Work or School: Disabled 2ndary to psychiatric conditions      Home Situation: lives alone with 2 cats and one dog      Spiritual Beliefs: Christian      Lifestyle: no regular exercise, diet  Not great - wants to embark on healthier lifestyle    Past Medical History, Surgical history, Social history, and Family history were reviewed and updated as appropriate.   Please see review of systems for further details on the patient's review from today.   Objective:   Physical Exam:  There were no vitals taken for this visit.  Physical Exam Neurological:     Mental Status: She is alert and oriented to person, place, and time.     Cranial Nerves: No dysarthria.  Psychiatric:        Attention and Perception: Attention normal.  Mood and Affect: Mood is anxious and depressed. Affect is not angry.        Speech: Speech normal. Speech is not rapid and pressured.        Behavior: Behavior is cooperative.        Thought Content: Thought content normal. Thought content is not paranoid or delusional.  Thought content does not include homicidal or suicidal ideation. Thought content does not include homicidal or suicidal plan.        Cognition and Memory: Cognition and memory normal.     Comments: Site and judgment fair not overtly manic.  She may be sabotaging treatment with medication but that is not entirely clear.     Lab Review:     Component Value Date/Time   NA 139 01/27/2018 1148   K 4.7 01/27/2018 1148   CL 104 01/27/2018 1148   CO2 30 01/27/2018 1148   GLUCOSE 95 01/27/2018 1148   BUN 11 01/27/2018 1148   CREATININE 0.77 01/27/2018 1148   CALCIUM 9.5 01/27/2018 1148   PROT 5.7 (L) 04/21/2009 0400   ALBUMIN 3.2 (L) 04/21/2009 0400   AST 27 04/21/2009 0400   ALT 43 (H) 04/21/2009 0400   ALKPHOS 58 04/21/2009 0400   BILITOT 0.4 04/21/2009 0400   GFRNONAA >60 04/21/2009 0400   GFRAA  04/21/2009 0400    >60        The eGFR has been calculated using the MDRD equation. This calculation has not been validated in all clinical situations. eGFR's persistently <60 mL/min signify possible Chronic Kidney Disease.       Component Value Date/Time   WBC 4.6 01/27/2018 1148   RBC 4.57 01/27/2018 1148   HGB 14.3 01/27/2018 1148   HCT 42.2 01/27/2018 1148   PLT 311.0 01/27/2018 1148   MCV 92.4 01/27/2018 1148   MCHC 33.9 01/27/2018 1148   RDW 13.3 01/27/2018 1148    No results found for: POCLITH, LITHIUM   Lab Results  Component Value Date   VALPROATE 25.3 (L) 10/26/2008     .res Assessment: Plan:    Jonnell was seen today for follow-up and other.  Diagnoses and all orders for this visit:  Bipolar 1 disorder, mixed, moderate (HCC)  PTSD (post-traumatic stress disorder) -     busPIRone (BUSPAR) 15 MG tablet; Take 1 tablet (15 mg total) by mouth 3 (three) times daily.  Panic disorder with agoraphobia -     diazepam (VALIUM) 5 MG tablet; Take 1 tablet (5 mg total) by mouth every 8 (eight) hours as needed for anxiety.  Nightmares associated with chronic  post-traumatic stress disorder -     doxazosin (CARDURA) 4 MG tablet; Take 1.5 tablets (6 mg total) by mouth at bedtime.  Chronic noncompliance complicates treatment. Likey borderline personality disorder.  We discussed her chronic sx of depression, anxiety and insomnia combined with medication sensitivity which makes it very difficult to treat her.  Multiple med failures.  Poor insight into bipolar and refuses mood stabilizers.  This was discussed with her repeatedly. Guarded prognosis.   Disc risk mood swings without suitable mood stabilizer but she refuses any new meds.  She is having some mixed symptoms with racing thoughts and spending in addition to the depressive symptoms.  We may need better mood stability.  Discussed the next option of   VPA.  She took  VPA 2010 but doesn't remember the effects.  Refuses.  Per her request reduce lithium to 300 daily for 2 weeks and stop.  Disc risk or relapse and call if there's a problem.  Refuses more antidepressant trials.   Discussed safety plan at length with patient.  Advised patient to contact office with any worsening signs and symptoms.  Instructed patient to go to the [facility] emergency room for evaluation if experiencing any acute safety concerns, to include suicidal intent and violent intent.  No temazepam with Xanax. We discussed the short-term risks associated with benzodiazepines including sedation and increased fall risk among others. Will agree to continue Xanax TID prn though this not ideal and tolerance is a concern.  However it has a short half life and BID will not help throughout the day.   Discussed long-term side effect risk including dependence, potential withdrawal symptoms, and the potential eventual dose-related risk of dementia.  Especially risky in PTSD bc of the high risk of tolerance in PTSD patients.   She's aware of the risk of disinhibition from Xanax.  Disc research showing high dosage gabapentin and Lyrica can sometimes  help and has less risk of the above.  She refuses any new meds right now.    NM controlled with doxazosin  Rec restart therapy.  Says she knows she needs it.  Says she can't afford it.  FU 3 mos  Lynder Parents, MD, DFAPA   Please see After Visit Summary for patient specific instructions.  Future Appointments  Date Time Provider [place]  01/22/2019 10:30 AM [place], MD [facility]  02/13/2019  8:00 AM [facility] MM 3 [facility]  02/13/2019  8:30 AM [facility] DX DEXA 1 [facility]    No orders of the defined types were placed in this encounter.     -------------------------------

## 2019-01-16 ENCOUNTER — Telehealth: Payer: Self-pay

## 2019-01-16 NOTE — Telephone Encounter (Signed)
Pt. Called and stated that she needs something to help her sleep. She has tried OTC meds such as Diphenhydramine and Melatonin. Send to [facility]  on [address]. Please advise.

## 2019-01-16 NOTE — Telephone Encounter (Signed)
We just had an appointment yesterday and switched her from Xanax to diazepam in order to help improve her sleep.  She has not had time to give that an adequate trial.  She needs to give it a week or 2

## 2019-01-16 NOTE — Telephone Encounter (Signed)
Pt. Made aware and verbalized understanding.

## 2019-01-21 NOTE — Progress Notes (Signed)
TELEHEALTH VISIT  Referring Provider: Lucretia Kern, DO Primary Care Physician:  Lucretia Kern, DO   Tele-visit due to COVID-19 pandemic Patient requested visit virtually, consented to the virtual encounter via video enabled telemedicine application (Zoom) Contact made at: 01/22/2019 11:00 Patient verified by name and date of birth Location of patient: Home Location provider: [place] [facility] Names of persons participating: Me, patient, Tinnie Gens CMA Time spent on telehealth visit: 26 minutes I discussed the limitations of evaluation and management by telemedicine. The patient expressed understanding and agreed to proceed.  IMPRESSION:  Chronic diarrhea - started 16 years ago at the time of cholecystectomy - intermittent for many years - progressively worse over the last 6 months - now having 5-7 BM daily - no alarm features    - Normal duodenal and colon biopsies 04/09/2018    - using Immodium PRN.     - no improvement on probiotics Abdominal pain and bloating - improved with diet changes: avoiding caffeine, sugar, flour, dairy - break through symptoms improved with Xanax - prior diagnosis of IBS by Dr. Collene Mares in 2009, treated with Align    - Biopsies negative for H. 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 Collene Mares 2009  Cholecystectomy for cholelithiasis 16 years ago  Diarrhea persists without alarm features, although somewhat improved on Cholestipol for possible post-cholecystectomy diarrhea. .   Food allergy testing planned for next week. Recommend a trial of Xifaxan for possible diarrhea-IBS and SIBO.   I agree with the recommendations for the Mediterranean Diet.   PLAN: Avoid dairy Continue Cholestipol as you are taking it Trial of Xifaxan 550mg  TID x 2 weeks Await results from food  allergy testing Consider GES if evaluation is negative Obtain food allergy test results from Dr. Fredderick Phenix Return to clinic in mid to late September, or earlier as needed  Please see the "Patient Instructions" section for addition details about the plan.    HPI: Brittany [name] is a 63 y.o. female who is seen in a follow-up virtual visit. She was last seen in consultation 03/10/18 and in follow-up 04/23/18. She had an EGD and colonoscopy 04/09/18.  Things improved initially on Cholestipol. She did not tolerate 2 BID due to severe constipation. She is taking 2 cholestipol in the morning. She will titrate the dose to avoid further constipation.  When she stopped her ketogenic diet she developed severe constipation alternating with explosive watery diarrhea. Now with up to 7 formed BM daily. At it's worst, she was having 10-15 BM daily. Occasional mucous. No blood. Abdominal pain continues to be diffuse, can be severe, feels like stabbing or punching. Improved with defecation.   Constant bloating. She feels like her stomach is full and her stomach is no emptying. Associated eructation and reflux. Using antiacid for relief. Significant flatus.  Avoiding dairy. Gets sick with eggs.  No other identified exacerbating or relieving features.   Poor energy. Poor appetite. Has lost 11 pounds since restarting ketogenic.   She prefers to avoid medications if possible. She is very sensitive to medications.   Her daughter is a Engineer, maintenance (IT). She is considering alternatives to the ketogenic diet.  She has an appointment with Dr. Fredderick Phenix next week for food allergy testing.   Feeling very isolated with coronavirus. Her dog a Insurance risk surveyor of 14 years died recently. Her two cats are keeping her company. She hasn't seen  her grandchildren in 7 months. Occasionally sees her daughter.   Prior endoscopy: Upper endoscopy and colonoscopy 04/09/2018  revealed pancolonic diverticulosis and was otherwise  normal. Gastric biopsy showed chronic inactive gastritis with no evidence of H. pylori.  Biopsies from the duodenum and colon were normal  Past Medical History:  Diagnosis Date   Abnormal EKG    Anxiety    Asthma    Bipolar depression ([place]), Anxiety, PTSD, Panic disorder    -managed by [facility]   Depression    Foot pain    GERD (gastroesophageal reflux disease)    Heart murmur    as a child   Insomnia    Leg swelling    Osteoporosis    Tachycardia     Past Surgical History:  Procedure Laterality Date   CARPAL TUNNEL RELEASE Left    CESAREAN SECTION     x 2   CHOLECYSTECTOMY     ENDOMETRIAL ABLATION     KNEE SURGERY Right    reconstruction for patellar dislocation   LAPAROSCOPY     x 2   TUBAL LIGATION     ULNAR NERVE REPAIR Left     Current Outpatient Medications  Medication Sig Dispense Refill   Albuterol Sulfate (PROAIR RESPICLICK) 161 (90 Base) MCG/ACT AEPB Inhale 2 puffs into the lungs every 6 (six) hours as needed. 1 each 1   busPIRone (BUSPAR) 15 MG tablet Take 1 tablet (15 mg total) by mouth 3 (three) times daily. 270 tablet 0   Cholecalciferol (VITAMIN D-3 PO) Take 5,000 Units by mouth daily.      colestipol (COLESTID) 1 g tablet TAKE 2 TABLETS BY MOUTH ONCE DAILY FOR 7 DAYS AND THEN INCREASE TO 2 TABLETS TWICE DAILY 120 tablet 0   diazepam (VALIUM) 5 MG tablet Take 1 tablet (5 mg total) by mouth every 8 (eight) hours as needed for anxiety. 90 tablet 0   diphenhydrAMINE HCl, Sleep, (SLEEP-AID MAXIMUM STRENGTH) 50 MG CAPS Take 50 mg by mouth at bedtime as needed.     doxazosin (CARDURA) 4 MG tablet Take 1.5 tablets (6 mg total) by mouth at bedtime. 135 tablet 1   fluticasone (FLONASE) 50 MCG/ACT nasal spray Use 2 spray(s) in each nostril once daily 16 g 3   fluticasone (FLOVENT HFA) 44 MCG/ACT inhaler Inhale 2 puffs into the lungs 2 (two) times a day. 3 Inhaler 1   lithium 300 MG tablet Take 2 tablets (600 mg total) by  mouth daily. 180 tablet 0   lithium carbonate 150 MG capsule TAKE 1 CAPSULE BY MOUTH ONCE DAILY IN THE EVENING (Patient not taking: Reported on 01/15/2019) 90 capsule 0   Melatonin 3 MG TABS Take 1 tablet by mouth at bedtime.     montelukast (SINGULAIR) 10 MG tablet TAKE 1 TABLET BY MOUTH AT BEDTIME 90 tablet 0   No current facility-administered medications for this visit.     Allergies as of 01/22/2019 - Review Complete 01/15/2019  Allergen Reaction Noted   Aspirin Other (See Comments)    Codeine Itching 09/26/2017   Sulfamethoxazole-trimethoprim Other (See Comments)    Trileptal [oxcarbazepine]  11/28/2018   Trimethoprim Other (See Comments) 11/12/2014   Ultram [tramadol] Itching 09/26/2017   Vioxx [rofecoxib]      Family History  Problem Relation Age of Onset   Heart failure Mother    Hypertension Mother    Hyperlipidemia Mother    Other Father        killed   COPD Paternal 66  alot of aunts and uncle COPD or Emphysema   Emphysema Paternal Uncle    Colon polyps Maternal Aunt    COPD Brother    Heart disease Brother    Pulmonary embolism Brother    Breast cancer Neg Hx    Colon cancer Neg Hx    Esophageal cancer Neg Hx    Pancreatic cancer Neg Hx    Stomach cancer Neg Hx    Liver disease Neg Hx     Social History   Socioeconomic History   Marital status: Single    Spouse name: Not on file   Number of children: 3   Years of education: Not on file   Highest education level: Not on file  Occupational History   Occupation: disabled  Social Designer, fashion/clothing strain: Not on file   Food insecurity    Worry: Not on file    Inability: Not on file   Transportation needs    Medical: Not on file    Non-medical: Not on file  Tobacco Use   Smoking status: Former Smoker    Packs/day: 20.00    Years: 1.00    Pack years: 20.00    Quit date: 06/19/1999    Years since quitting: 19.6   Smokeless tobacco: Never Used    Tobacco comment: 20 year estimate but probably less   Substance and Sexual Activity   Alcohol use: Yes    Alcohol/week: 0.0 standard drinks    Comment: Occass.    Drug use: No   Sexual activity: Never  Lifestyle   Physical activity    Days per week: Not on file    Minutes per session: Not on file   Stress: Not on file  Relationships   Social connections    Talks on phone: Not on file    Gets together: Not on file    Attends religious service: Not on file    Active member of club or organization: Not on file    Attends meetings of clubs or organizations: Not on file    Relationship status: Not on file   Intimate partner violence    Fear of current or ex partner: Not on file    Emotionally abused: Not on file    Physically abused: Not on file    Forced sexual activity: Not on file  Other Topics Concern   Not on file  Social History Narrative   Work or School: Disabled 2ndary to psychiatric conditions      Home Situation: lives alone with 2 cats and one dog      Spiritual Beliefs: Christian      Lifestyle: no regular exercise, diet  Not great - wants to embark on healthier lifestyle    Review of Systems: ALL ROS discussed and all others negative except listed in HPI.  Physical Exam: Complete physical exam not performed due to the limits inherent in a telehealth encounter.  General: Awake, alert, and oriented, and well communicative. In no acute distress.  HEENT: EOMI, non-icteric sclera, NCAT, MMM  Neck: Normal movement of head and neck  Pulm: No labored breathing, speaking in full sentences without conversational dyspnea  Derm: No apparent lesions or bruising in visible field  MS: Moves all visible extremities without noticeable abnormality  Psych: Pleasant, cooperative, normal speech, normal affect and normal insight Neuro: Alert and appropriate   Kaina Orengo L. Tarri Glenn, MD, MPH [facility] 01/21/2019, 4:38 PM

## 2019-01-22 ENCOUNTER — Encounter: Payer: Self-pay | Admitting: Gastroenterology

## 2019-01-22 ENCOUNTER — Ambulatory Visit (INDEPENDENT_AMBULATORY_CARE_PROVIDER_SITE_OTHER): Payer: PPO | Admitting: Gastroenterology

## 2019-01-22 VITALS — Ht 61.0 in | Wt 178.0 lb

## 2019-01-22 DIAGNOSIS — R1084 Generalized abdominal pain: Secondary | ICD-10-CM

## 2019-01-22 DIAGNOSIS — R140 Abdominal distension (gaseous): Secondary | ICD-10-CM

## 2019-01-22 DIAGNOSIS — K529 Noninfective gastroenteritis and colitis, unspecified: Secondary | ICD-10-CM | POA: Diagnosis not present

## 2019-01-22 MED ORDER — RIFAXIMIN 550 MG PO TABS: 550.0000 mg | ORAL_TABLET | Freq: Three times a day (TID) | ORAL | 0 refills | Status: DC

## 2019-01-22 NOTE — Patient Instructions (Signed)
Avoid Dairy. Please see the information below.   Continue Colestipol as you are taking it.   We have sent your demographic information and a prescription for Xifaxan to [facility]. This pharmacy is able to get medication approved through insurance and get you the lowest copay possible. If you have not heard from them within 1 week, please call our office at [phone] to let us know.  Await results from food allergy testing.   Follow up in September.   Follow these instructions at home:  Avoid foods, beverages, and medicines that contain lactose, as told by your health care provider. Keep track of which foods, beverages, or medicines cause symptoms so you can decide what to avoid in the future.  Read food and medicine labels carefully. Avoid products that contain: ? Lactose. ? Milk solids. ? Casein.  ? Whey.  Take over-the-counter and prescription medicines (including lactase tablets) only as told by your health care provider.  If you stop eating and drinking dairy products (eliminate dairy from your diet), make sure to get enough protein, calcium, and vitamin D from other foods. Work with your health care provider or a diet and nutrition specialist (dietitian) to make sure you get enough of those nutrients.  Choose a milk substitute that is fortified with calcium and vitamin D. ? Soy milk contains high-quality protein. ? Milks that are made from nuts or grains (such as almond milk and rice milk) contain very small amounts of protein.  Keep all follow-up visits as told by your health care provider. This is important.

## 2019-01-26 ENCOUNTER — Telehealth: Payer: Self-pay | Admitting: Gastroenterology

## 2019-01-26 NOTE — Telephone Encounter (Signed)
Let's see if they will approve Motegrity 2 mg daily. We can start with one month with 2 refills. Thanks.

## 2019-01-26 NOTE — Telephone Encounter (Signed)
Motegrity is also not covered, it will be $522 for the patient.

## 2019-01-26 NOTE — Telephone Encounter (Signed)
Checked sample closet and we do have enough Xifaxan to get patient through a 14 day course. She expressed gratitude but states she has no transportation and hopefully her daughter will be able to pick them up. She states she was told she probably has a small bowel infection and she's worry about it spreading to the rest of her body and killing her. So she needs the medicine ASAP or she's just going to die. I reassured patient everything will be fine and her daughter can pick up the samples for her asap they will be available.

## 2019-01-26 NOTE — Telephone Encounter (Signed)
Spoke with patient, she states that her Doreene Nest is over $500 and she cannot afford that. She states she can only afford about $10-$15 for medications right now. She is very down and said "if she can't find me something else to take I will just go ahead and die" Please advise any alternative.

## 2019-01-26 NOTE — Telephone Encounter (Signed)
Pt reported that [facility] told her that Xifaxan is $500; she will not be able to afford it.

## 2019-01-26 NOTE — Telephone Encounter (Signed)
Left patient detailed message. Xifaxan will be sent to her house via mail order pharmacy.

## 2019-01-26 NOTE — Telephone Encounter (Signed)
Thank you for finding her samples. The medications is used for bacterial overgrowth and IBS. This is no an infection. Bacterial normally live throughout the GI tract. This antibiotic can help reset the ratio of good bacteria to the bacteria that can cause symptoms. This is not a lifethreatening diagnosis. Thank you.

## 2019-02-10 ENCOUNTER — Telehealth: Payer: Self-pay | Admitting: Psychiatry

## 2019-02-10 ENCOUNTER — Other Ambulatory Visit: Payer: Self-pay | Admitting: Psychiatry

## 2019-02-10 DIAGNOSIS — F4001 Agoraphobia with panic disorder: Secondary | ICD-10-CM

## 2019-02-10 MED ORDER — MIRTAZAPINE 15 MG PO TABS: 15.0000 mg | ORAL_TABLET | Freq: Every day | ORAL | 0 refills | Status: DC

## 2019-02-10 NOTE — Telephone Encounter (Signed)
appt 10/30

## 2019-02-10 NOTE — Telephone Encounter (Signed)
Pt called having issues with meds. Ask for nurse to return call to explain issues @ [phone]

## 2019-02-10 NOTE — Telephone Encounter (Signed)
She would like to try the Mirtazapine. Can you please send to the [facility]  on [address]?

## 2019-02-10 NOTE — Telephone Encounter (Signed)
Mirtazapine 15 mg sent to pharmacy for sleep

## 2019-02-10 NOTE — Telephone Encounter (Signed)
She cannot take temazepam with diazepam because those 2 meds were in the same family.  I do not see that she is tried mirtazapine.  She could take mirtazapine 15 mg while she also takes diazepam.  That may work for her.  She could also try increasing the nighttime dose of diazepam at night to see if that would stop the nightmares.  Nightmares can occur with almost any sleep medication but they can also go away with dosage changes.

## 2019-02-10 NOTE — Telephone Encounter (Signed)
The diazepam is helping anxiety but it is giving her nightmares. She has tried taking it different taking ways. She has been taking it during the day and her anxiety has been more under control than it has been in a long time. She cannot take it at night to help her sleep because of the bad nightmares. She is wanting to know if she can restart the Temazepam to help her sleep? Please Advise.

## 2019-02-11 ENCOUNTER — Encounter: Payer: Self-pay | Admitting: Family Medicine

## 2019-02-13 ENCOUNTER — Other Ambulatory Visit: Payer: Self-pay

## 2019-02-13 ENCOUNTER — Ambulatory Visit
Admission: RE | Admit: 2019-02-13 | Discharge: 2019-02-13 | Disposition: A | Discharge status: Home / Self Care | Payer: PPO | Source: Ambulatory Visit | Attending: Family Medicine | Admitting: Family Medicine

## 2019-02-13 ENCOUNTER — Ambulatory Visit (INDEPENDENT_AMBULATORY_CARE_PROVIDER_SITE_OTHER): Payer: PPO | Admitting: *Deleted

## 2019-02-13 DIAGNOSIS — J3081 Allergic rhinitis due to animal (cat) (dog) hair and dander: Secondary | ICD-10-CM | POA: Diagnosis not present

## 2019-02-13 DIAGNOSIS — Z23 Encounter for immunization: Secondary | ICD-10-CM

## 2019-02-13 DIAGNOSIS — Z780 Asymptomatic menopausal state: Secondary | ICD-10-CM | POA: Diagnosis not present

## 2019-02-13 DIAGNOSIS — J3089 Other allergic rhinitis: Secondary | ICD-10-CM | POA: Diagnosis not present

## 2019-02-13 DIAGNOSIS — Z1231 Encounter for screening mammogram for malignant neoplasm of breast: Secondary | ICD-10-CM

## 2019-02-13 DIAGNOSIS — H1045 Other chronic allergic conjunctivitis: Secondary | ICD-10-CM | POA: Diagnosis not present

## 2019-02-13 DIAGNOSIS — J4530 Mild persistent asthma, uncomplicated: Secondary | ICD-10-CM | POA: Diagnosis not present

## 2019-02-13 DIAGNOSIS — M8589 Other specified disorders of bone density and structure, multiple sites: Secondary | ICD-10-CM | POA: Diagnosis not present

## 2019-02-13 DIAGNOSIS — M8580 Other specified disorders of bone density and structure, unspecified site: Secondary | ICD-10-CM

## 2019-02-13 DIAGNOSIS — H16223 Keratoconjunctivitis sicca, not specified as Sjogren's, bilateral: Secondary | ICD-10-CM | POA: Diagnosis not present

## 2019-02-13 NOTE — Progress Notes (Signed)
Patient in for influenza vaccine. Vaccine administered with no reactions.

## 2019-03-10 ENCOUNTER — Other Ambulatory Visit: Payer: Self-pay | Admitting: Psychiatry

## 2019-03-10 DIAGNOSIS — F4001 Agoraphobia with panic disorder: Secondary | ICD-10-CM

## 2019-03-11 ENCOUNTER — Ambulatory Visit: Payer: PPO | Admitting: Gastroenterology

## 2019-03-11 ENCOUNTER — Other Ambulatory Visit: Payer: Self-pay

## 2019-03-11 ENCOUNTER — Telehealth: Payer: Self-pay | Admitting: Psychiatry

## 2019-03-11 ENCOUNTER — Encounter: Payer: Self-pay | Admitting: Gastroenterology

## 2019-03-11 ENCOUNTER — Ambulatory Visit (INDEPENDENT_AMBULATORY_CARE_PROVIDER_SITE_OTHER): Payer: PPO | Admitting: Gastroenterology

## 2019-03-11 DIAGNOSIS — K219 Gastro-esophageal reflux disease without esophagitis: Secondary | ICD-10-CM | POA: Diagnosis not present

## 2019-03-11 DIAGNOSIS — K529 Noninfective gastroenteritis and colitis, unspecified: Secondary | ICD-10-CM | POA: Diagnosis not present

## 2019-03-11 DIAGNOSIS — R1084 Generalized abdominal pain: Secondary | ICD-10-CM | POA: Diagnosis not present

## 2019-03-11 DIAGNOSIS — R140 Abdominal distension (gaseous): Secondary | ICD-10-CM | POA: Diagnosis not present

## 2019-03-11 NOTE — Telephone Encounter (Signed)
appt 10/30

## 2019-03-11 NOTE — Progress Notes (Signed)
TELEHEALTH VISIT  Referring Provider: Lucretia Kern, DO Primary Care Physician:  Lucretia Kern, DO   Tele-visit due to COVID-19 pandemic Patient requested visit virtually, consented to the virtual encounter via video enabled telemedicine application (Zoom) Contact made at: 03/11/19 10:35 Patient verified by name and date of birth Location of patient: Home Location provider: [place] [facility] Names of persons participating: Me, patient, Tinnie Gens CMA Time spent on telehealth visit: 26 minutes I discussed the limitations of evaluation and management by telemedicine. The patient expressed understanding and agreed to proceed.  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 Collene Mares in 2009, treated with Align    - Normal duodenal and colon biopsies 04/09/2018    - using Immodium PRN.     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 Collene Mares 2009    - no polyps on colonoscopy 04/09/2018  Cholecystectomy for cholelithiasis 16 years ago  Symptoms dramatically improved with empiric trial of Xifaxan but returned within 2 weeks of completing treatment.   PLAN: Lactulose Breath test for SIBO   Continue to avoid dairy Continue Cholestipol at current dosing Consider repeat trial of Trial of Xifaxan 550mg  TID x 2 weeks or other antibiotic if  testing is + Consider GES if evaluation is negative Follow-up in 4 weeks  Please see the "Patient Instructions" section for addition details about the plan.    HPI: Brave Shemaiah Demars is a 63 y.o. female who is seen in a follow-up virtual visit. She was seen in consultation 03/10/18 and in follow-up 04/23/18 and 01/22/19. She had an EGD and colonoscopy 04/09/18.  Things improved initially on Cholestipol. She did not tolerate 2 BID due to severe constipation. She is taking 2 cholestipol in the morning. She will titrate the dose to avoid further constipation.  Continues to have up to 7 formed BM daily. At it's worst, she was having 10-15 BM daily. Occasional mucous. No blood. Abdominal pain continues to be diffuse, can be severe, feels like stabbing or punching. Improved with defecation.  Associated, near constant bloating and early satiety. Bothered by a large amount of ongoing gas.   Since her list visit, she tried [employer] for a few days. It didn't "sit well" with her. Increased her bloating.  Returned to keto diet to minimize the worsening of symptoms.   Two weeks of Xifaxan provided significant relief in her symptoms. She had no significant bloating or diarrhea during this time.  However all symptoms returned within 2 weeks of completing treatment.   Has thrown up once a month for the last two months.   Knows that she is lactose sensitive if not intolerant.  Avoids almost all dairy.   She prefers to avoid medications if possible. She is very sensitive to medications.    Prior endoscopy: Upper endoscopy and colonoscopy 04/09/2018  revealed pancolonic diverticulosis and was otherwise normal. Gastric  biopsy showed chronic inactive gastritis with no evidence of H. pylori.  Biopsies from the duodenum and colon were normal  Past Medical History:  Diagnosis Date  . Abnormal EKG   . Anxiety   . Asthma   . Bipolar depression ([place]), Anxiety, PTSD, Panic disorder    -managed by  [facility]  . Depression   . Foot pain   . GERD (gastroesophageal reflux disease)   . Heart murmur    as a child  . Insomnia   . Leg swelling   . Osteoporosis   . Tachycardia     Past Surgical History:  Procedure Laterality Date  . CARPAL TUNNEL RELEASE Left   . CESAREAN SECTION     x 2  . CHOLECYSTECTOMY    . ENDOMETRIAL ABLATION    . KNEE SURGERY Right    reconstruction for patellar dislocation  . LAPAROSCOPY     x 2  . TUBAL LIGATION    . ULNAR NERVE REPAIR Left     Current Outpatient Medications  Medication Sig Dispense Refill  . Albuterol Sulfate (PROAIR RESPICLICK) 123XX123 (90 Base) MCG/ACT AEPB Inhale 2 puffs into the lungs every 6 (six) hours as needed. 1 each 1  . busPIRone (BUSPAR) 15 MG tablet Take 1 tablet (15 mg total) by mouth 3 (three) times daily. 270 tablet 0  . Cholecalciferol (VITAMIN D-3 PO) Take 5,000 Units by mouth daily.     . colestipol (COLESTID) 1 g tablet TAKE 2 TABLETS BY MOUTH ONCE DAILY FOR 7 DAYS AND THEN INCREASE TO 2 TABLETS TWICE DAILY 120 tablet 0  . diazepam (VALIUM) 5 MG tablet TAKE 1 TABLET BY MOUTH EVERY 8 HOURS AS NEEDED FOR ANXIETY 90 tablet 0  . diphenhydrAMINE HCl, Sleep, (SLEEP-AID MAXIMUM STRENGTH) 50 MG CAPS Take 50 mg by mouth at bedtime as needed.    . doxazosin (CARDURA) 4 MG tablet Take 1.5 tablets (6 mg total) by mouth at bedtime. 135 tablet 1  . fluticasone (FLONASE) 50 MCG/ACT nasal spray Use 2 spray(s) in each nostril once daily 16 g 3  . fluticasone (FLOVENT HFA) 44 MCG/ACT inhaler Inhale 2 puffs into the lungs 2 (two) times a day. 3 Inhaler 1  . mirtazapine (REMERON) 15 MG tablet Take 1 tablet (15 mg total) by mouth at bedtime. 30 tablet 0  . montelukast (SINGULAIR) 10 MG tablet TAKE 1 TABLET BY MOUTH AT BEDTIME 90 tablet 0  . rifaximin (XIFAXAN) 550 MG TABS tablet Take 1 tablet (550 mg total) by mouth 3 (three) times daily. 42 tablet 0   No current facility-administered medications for this visit.      Allergies as of 03/11/2019 - Review Complete 01/22/2019  Allergen Reaction Noted  . Aspirin Other (See Comments)   . Codeine Itching 09/26/2017  . Sulfamethoxazole-trimethoprim Other (See Comments)   . Trileptal [oxcarbazepine]  11/28/2018  . Trimethoprim Other (See Comments) 11/12/2014  . Ultram [tramadol] Itching 09/26/2017  . Vioxx [rofecoxib]      Family History  Problem Relation Age of Onset  . Heart failure Mother   . Hypertension Mother   . Hyperlipidemia Mother   . Other Father        killed  . COPD Paternal Aunt        alot of aunts and uncle COPD or Emphysema  . Emphysema Paternal Uncle   . Colon polyps Maternal Aunt   . COPD Brother   . Heart disease Brother   . Pulmonary embolism Brother   .  Breast cancer Neg Hx   . Colon cancer Neg Hx   . Esophageal cancer Neg Hx   . Pancreatic cancer Neg Hx   . Stomach cancer Neg Hx   . Liver disease Neg Hx     Social History   Socioeconomic History  . Marital status: Single    Spouse name: Not on file  . Number of children: 3  . Years of education: Not on file  . Highest education level: Not on file  Occupational History  . Occupation: disabled  Social Needs  . Financial resource strain: Not on file  . Food insecurity    Worry: Not on file    Inability: Not on file  . Transportation needs    Medical: Not on file    Non-medical: Not on file  Tobacco Use  . Smoking status: Former Smoker    Packs/day: 20.00    Years: 1.00    Pack years: 20.00    Quit date: 06/19/1999    Years since quitting: 19.7  . Smokeless tobacco: Never Used  . Tobacco comment: 20 year estimate but probably less   Substance and Sexual Activity  . Alcohol use: Yes    Alcohol/week: 0.0 standard drinks    Comment: Occass.   . Drug use: No  . Sexual activity: Never  Lifestyle  . Physical activity    Days per week: Not on file    Minutes per session: Not on file  . Stress: Not on file  Relationships  . Social Herbalist on  phone: Not on file    Gets together: Not on file    Attends religious service: Not on file    Active member of club or organization: Not on file    Attends meetings of clubs or organizations: Not on file    Relationship status: Not on file  . Intimate partner violence    Fear of current or ex partner: Not on file    Emotionally abused: Not on file    Physically abused: Not on file    Forced sexual activity: Not on file  Other Topics Concern  . Not on file  Social History Narrative   Work or School: Disabled 2ndary to psychiatric conditions      Home Situation: lives alone with 2 cats and one dog      Spiritual Beliefs: Christian      Lifestyle: no regular exercise, diet  Not great - wants to embark on healthier lifestyle    Review of Systems: ALL ROS discussed and all others negative except listed in HPI.  Physical Exam: Complete physical exam not performed due to the limits inherent in a telehealth encounter.  General: Awake, alert, and oriented, and well communicative. In no acute distress.  HEENT: EOMI, non-icteric sclera, NCAT, MMM  Neck: Normal movement of head and neck  Pulm: No labored breathing, speaking in full sentences without conversational dyspnea  Derm: No apparent lesions or bruising in visible field  MS: Moves all visible extremities without noticeable abnormality  Psych: Pleasant, cooperative, normal speech, normal affect and normal insight Neuro: Alert and appropriate   Whitlee Sluder L. Tarri Glenn, MD, MPH [facility] 03/11/2019, 10:34 AM

## 2019-03-11 NOTE — Patient Instructions (Addendum)
I have recommended a breath test for bacterial overgrowth. Please come into the office to pick up a test kit and the instructions.   Continue to avoid dairy.   Continue to take cholestipol at current dosing.  If that is not working, you could try bismuth salicylate (Pepto-Bismol) 30 mL or two tablets every 30 minutes for eight doses. Pepto-Bismol may make your stools black.  A daily probiotic may be very helpful.   Call with any concerns prior to the results being available.  Let's plan to check in one month, or earlier as needed.

## 2019-03-11 NOTE — Telephone Encounter (Signed)
Patient need refills on Mertazapine and Biazepam to be sent to [facility] at [facility]

## 2019-03-11 NOTE — Telephone Encounter (Signed)
30 day refills pended  Has appt 04/17/2019

## 2019-03-12 ENCOUNTER — Encounter: Payer: Self-pay | Admitting: Gastroenterology

## 2019-03-22 ENCOUNTER — Other Ambulatory Visit: Payer: Self-pay | Admitting: Gastroenterology

## 2019-04-01 ENCOUNTER — Telehealth: Payer: Self-pay | Admitting: Gastroenterology

## 2019-04-01 NOTE — Telephone Encounter (Signed)
Patient calling in and said that she had an appt with Dr. Tarri Glenn on 9/23 and that Dr. Tarri Glenn instructed her to take an at home breath test. She stated that she thought Dr. Tarri Glenn had told her they would be mailed to her and she has not received them. I told her it looks like in the notes she was supposed to come by and pick them up. Patient is wanting to still get them and is requesting a phone call back.

## 2019-04-01 NOTE — Telephone Encounter (Signed)
Spoke to the patient who was highly upset about not being mailed the SIBO breath test. This RN told the patient that under the instructions in her AVS it was documented to come and retrieve the breath test from the clinic. The patient was told that the patient's are not usually mailed the kit. She remained upset and frustrated speaking about a physician whom she called "Dr. Jenetta Downer" from [place] whom she states diagnosed her with apnea and didn't follow up. With further questioning, it was discovered the patient was speaking of pulmonology that was previously in the building. This RN continued to attempt to call the patient by scheduling her for a visit and asking how I could help make things better. This RN also told the patient to check with her insurance first to determine the cost of the breath test believing that if the patient who reports is on a fixed income and and has no transportation was surprised with a large bill this would add to her frustration. The patient reported she would confirm a price for the testing first with her insurance company, then she would call [facility] to request the test. The patient has been scheduled for a virtual visit with Dr. Tarri Glenn on 11/10 at 11:10 am due to having no transportation.

## 2019-04-02 NOTE — Telephone Encounter (Signed)
The patient just called on a line with her insurance company and they stated that Brittany [name] is not the person who needs to reach out to the insurance company to determine the cost of the breath test but stated that the providers are the ones who are to call. They stated that when calling the insurance company to ask for the Benefits and eligibility department to find out how it would be covered. I told them I would send this information to the nurse and that if the nurse had any questions about it she would call the patient back.

## 2019-04-02 NOTE — Telephone Encounter (Signed)
FYI- Spoke to the patient who has opened the conversations with hostility on 2 different days during both conversations. The patient reported the person she spoke to named Janett Billow from her insurance told her that she not only needed an ICD-10 code which was given to her but also a CPT code. This RN told the patient that we did not have a CPT code for this test. We only have CPT codes for procedures. The patient refused to accept this and stated this RN was "not listening" and "attacking" her. This is the same hostile behavior that was displayed during the phone call 10/15. The patient became so upset, she yelled "Well, I wont take the test!" and hung up. I then called the patient back who did not answer and left a message stating that she should call [facility] to ask further questions since we could not provide the code she was looking for.

## 2019-04-02 NOTE — Telephone Encounter (Signed)
Spoke to someone from [employer] who reported the patient would more than likely not have a co pay for the SIBO test. She requested that I send the order req to [facility], her insurance company would be in touch with [facility]. This RN faxed the order req to [facility] and will await on the patient to report she wants to continue on with the test. At that time I will place the test at the front desk for her to retrieve.

## 2019-04-02 NOTE — Telephone Encounter (Signed)
Thank you for your help. Having her complete the breath test would be ideal, but, sounds difficult.  If her insurance company will approve, could try Xifaxan 550mg  TID x 2 weeks prior to her follow-up visit next month.  Thank you.

## 2019-04-03 NOTE — Telephone Encounter (Addendum)
FYI Patient has been incredibly difficult and continued to show hostility on the phone. It was reported by the scheduler that the patient was also rude with her on the phone also. The scheduler called to inform this RN that the patient did not want to speak with me but did change her mind about taking the breath test. Breath test placed at front desk for patient for pick up.

## 2019-04-03 NOTE — Telephone Encounter (Signed)
Thank you for your assistance!

## 2019-04-14 DIAGNOSIS — R140 Abdominal distension (gaseous): Secondary | ICD-10-CM | POA: Diagnosis not present

## 2019-04-16 ENCOUNTER — Encounter: Payer: Self-pay | Admitting: Family Medicine

## 2019-04-16 ENCOUNTER — Other Ambulatory Visit: Payer: Self-pay

## 2019-04-16 ENCOUNTER — Telehealth (INDEPENDENT_AMBULATORY_CARE_PROVIDER_SITE_OTHER): Payer: PPO | Admitting: Family Medicine

## 2019-04-16 VITALS — Temp 97.7°F

## 2019-04-16 DIAGNOSIS — T7840XA Allergy, unspecified, initial encounter: Secondary | ICD-10-CM | POA: Diagnosis not present

## 2019-04-16 DIAGNOSIS — Z20828 Contact with and (suspected) exposure to other viral communicable diseases: Secondary | ICD-10-CM

## 2019-04-16 DIAGNOSIS — R05 Cough: Secondary | ICD-10-CM

## 2019-04-16 DIAGNOSIS — R059 Cough, unspecified: Secondary | ICD-10-CM

## 2019-04-16 DIAGNOSIS — Z20822 Contact with and (suspected) exposure to covid-19: Secondary | ICD-10-CM

## 2019-04-16 NOTE — Progress Notes (Signed)
Virtual Visit via Video Note  I connected with Brittany [name]  on 04/16/19 at 12:20 PM EDT by a video enabled telemedicine application and verified that I am speaking with the correct person using two identifiers.  Location patient: home Location provider:work or home office Persons participating in the virtual visit: patient, provider  I discussed the limitations of evaluation and management by telemedicine and the availability of in person appointments. The patient expressed understanding and agreed to proceed.   HPI:  Acute visit for COVID19 concerns: -she picked up her son in law and daughter at the airport from [place] 04/07/2019 - they all wore masks, son in law tested positive for COVID on 04/10/2019 -went to birthday party for grandson the 24th and was around her daughter and everyone did not wear masks there -she has had some sinus issues with allergies for the last 3 weeks -today she started feeling worse 2 days ago and has nasal congestion, cough and low grade temp of 99.8, cough is productive and mucus is clear -she is worried about covid because of the recent exposures to others, no known sick contacts -denies bodyaches, NVD out of the ordinary, SOB, loss of taste or smell - she did have to hold all of her asthma symptoms this week to do a breath test with her gastroenterologist - so she admits some of this could be from that and the allergies -she is back on her respiratory medications, used her albuterol once    ROS: See pertinent positives and negatives per HPI.  Past Medical History:  Diagnosis Date  . Abnormal EKG   . Anxiety   . Asthma   . Bipolar depression ([place]), Anxiety, PTSD, Panic disorder    -managed by [facility]  . Depression   . Foot pain   . GERD (gastroesophageal reflux disease)   . Heart murmur    as a child  . Insomnia   . Leg swelling   . Osteoporosis   . Tachycardia     Past Surgical History:  Procedure Laterality Date  . CARPAL TUNNEL  RELEASE Left   . CESAREAN SECTION     x 2  . CHOLECYSTECTOMY    . ENDOMETRIAL ABLATION    . KNEE SURGERY Right    reconstruction for patellar dislocation  . LAPAROSCOPY     x 2  . TUBAL LIGATION    . ULNAR NERVE REPAIR Left     Family History  Problem Relation Age of Onset  . Heart failure Mother   . Hypertension Mother   . Hyperlipidemia Mother   . Other Father        killed  . COPD Paternal Aunt        alot of aunts and uncle COPD or Emphysema  . Emphysema Paternal Uncle   . Colon polyps Maternal Aunt   . COPD Brother   . Heart disease Brother   . Pulmonary embolism Brother   . Breast cancer Neg Hx   . Colon cancer Neg Hx   . Esophageal cancer Neg Hx   . Pancreatic cancer Neg Hx   . Stomach cancer Neg Hx   . Liver disease Neg Hx     SOCIAL HX: see hpi   Current Outpatient Medications:  .  Albuterol Sulfate (PROAIR RESPICLICK) 123XX123 (90 Base) MCG/ACT AEPB, Inhale 2 puffs into the lungs every 6 (six) hours as needed., Disp: 1 each, Rfl: 1 .  busPIRone (BUSPAR) 15 MG tablet, Take 1 tablet (15 mg total) by  mouth 3 (three) times daily., Disp: 270 tablet, Rfl: 0 .  cetirizine (ZYRTEC) 10 MG tablet, Take 10 mg by mouth daily., Disp: , Rfl:  .  Cholecalciferol (VITAMIN D-3 PO), Take 5,000 Units by mouth daily. , Disp: , Rfl:  .  colestipol (COLESTID) 1 g tablet, TAKE 2 TABLETS BY MOUTH ONCE DAILY FOR 7 DAYS AND THEN INCREASE TO 2 TWICE DAILY THEREAFTER (Patient taking differently: TAKE 2 TABLETS BY MOUTH ONCE DAILY), Disp: 120 tablet, Rfl: 0 .  diazepam (VALIUM) 5 MG tablet, TAKE 1 TABLET BY MOUTH EVERY 8 HOURS AS NEEDED FOR ANXIETY, Disp: 90 tablet, Rfl: 2 .  doxazosin (CARDURA) 4 MG tablet, Take 1.5 tablets (6 mg total) by mouth at bedtime., Disp: 135 tablet, Rfl: 1 .  famotidine (PEPCID) 20 MG tablet, Take 20 mg by mouth 2 (two) times daily., Disp: , Rfl:  .  fluticasone (FLONASE) 50 MCG/ACT nasal spray, Use 2 spray(s) in each nostril once daily, Disp: 16 g, Rfl: 3 .   fluticasone (FLOVENT HFA) 44 MCG/ACT inhaler, Inhale 2 puffs into the lungs 2 (two) times a day., Disp: 3 Inhaler, Rfl: 1 .  mirtazapine (REMERON) 15 MG tablet, TAKE 1 TABLET BY MOUTH AT BEDTIME, Disp: 30 tablet, Rfl: 2 .  montelukast (SINGULAIR) 10 MG tablet, TAKE 1 TABLET BY MOUTH AT BEDTIME, Disp: 90 tablet, Rfl: 0  EXAM:  VITALS per patient if applicable: temp 123XX123 now  GENERAL: alert, oriented, appears well and in no acute distress  HEENT: atraumatic, conjunttiva clear, no obvious abnormalities on inspection of external nose and ears  NECK: normal movements of the head and neck  LUNGS: on inspection no signs of respiratory distress, breathing rate appears normal, no obvious gross SOB, gasping or wheezing, occ cough  CV: no obvious cyanosis  MS: moves all visible extremities without noticeable abnormality  PSYCH/NEURO: pleasant and cooperative, no obvious depression or anxiety, speech and thought processing grossly intact  ASSESSMENT AND PLAN:  Discussed the following assessment and plan:  Cough  Allergy, initial encounter  Close exposure to COVID-19 virus  -we discussed possible serious and likely etiologies, options for evaluation and workup, limitations of telemedicine visit vs in person visit, treatment, treatment risks and precautions. Pt prefers to treat via telemedicine empirically rather then risking or undertaking an in person visit at this moment. Possible COVID19, vs allergies/asthma vs VURI vs other. Did advised staying on her asthma allergy regimen, alb prn, home isolation, symptomatic care and COVID19 testing after discussion options and limitations. She is going to try to order the test from [facility] as prefers to not leave the house if not feeling that bad. Patient agrees to seek prompt in person care if worsening, new symptoms arise, or if is not improving with treatment.   I discussed the assessment and treatment plan with the patient. The patient was provided  an opportunity to ask questions and all were answered. The patient agreed with the plan and demonstrated an understanding of the instructions.   The patient was advised to call back or seek an in-person evaluation if the symptoms worsen or if the condition fails to improve as anticipated.   Lucretia Kern, DO

## 2019-04-16 NOTE — Patient Instructions (Signed)
Follow up: if worsening, new concerns or if you are not improving over the next few days.  Take all of your asthma and allergy medications as prescribed.  I hope you are feeling better soon! Seek care promptly if your symptoms worsen, new concerns arise or you are not improving with treatment.   Self Isolation/Home Quarantine: -see the CDC site for information:   RunningShows.co.za.html   -STAY HOME except for to seek medical care -stay in your own room away from others in your house and use a separate bathroom if possible -Wash hands frequently, disinfect high touch surface areas often, wear a mask if you leave your room and interact as little as possible with others -seek medical care immediately if worsening - call our office for a visit or call ahead if going elsewhere to an urgent care  -seek emergency care if very sick or severe symptoms - call 911 -isolate for at least 10 days from the onset of symptoms PLUS 1 days of no fever PLUS 1 days of improving symptoms  We recommend wearing a mask, social distancing, good hand hygiene and asking others  around you to do the same at all times when around others outside of your home unit throughout the [place] pandemic.    Novel Coronavirus Testing:   Positive test. These tests are not 100% perfect, but if you tested positive for COVID-19, this confirms that you have contracted the SARS-CoV-2 virus. STAY HOME to complete full Quarantine per CDC guidelines.  Negative test. These tests are not 100% perfect but if you tested negative for COVID-19, this indicates that you may not have contracted the SARS-CoV-2 virus. Follow your doctor's recommendations and the CDC guidelines.

## 2019-04-17 ENCOUNTER — Other Ambulatory Visit: Payer: Self-pay

## 2019-04-17 ENCOUNTER — Encounter: Payer: Self-pay | Admitting: Psychiatry

## 2019-04-17 ENCOUNTER — Ambulatory Visit (INDEPENDENT_AMBULATORY_CARE_PROVIDER_SITE_OTHER): Payer: PPO | Admitting: Psychiatry

## 2019-04-17 DIAGNOSIS — F4310 Post-traumatic stress disorder, unspecified: Secondary | ICD-10-CM

## 2019-04-17 DIAGNOSIS — F3162 Bipolar disorder, current episode mixed, moderate: Secondary | ICD-10-CM | POA: Diagnosis not present

## 2019-04-17 DIAGNOSIS — F515 Nightmare disorder: Secondary | ICD-10-CM | POA: Diagnosis not present

## 2019-04-17 DIAGNOSIS — F4001 Agoraphobia with panic disorder: Secondary | ICD-10-CM

## 2019-04-17 NOTE — Progress Notes (Signed)
Brittany [name] [id number] 1955-10-28 63 y.o.   Virtual Visit via [place]  I connected with pt by WebEx and verified that I am speaking with the correct person using two identifiers.   I discussed the limitations, risks, security and privacy concerns of performing an evaluation and management service by [facility] and the availability of in person appointments. I also discussed with the patient that there may be a patient responsible charge related to this service. The patient expressed understanding and agreed to proceed.  I discussed the assessment and treatment plan with the patient. The patient was provided an opportunity to ask questions and all were answered. The patient agreed with the plan and demonstrated an understanding of the instructions.   The patient was advised to call back or seek an in-person evaluation if the symptoms worsen or if the condition fails to improve as anticipated.  I provided 30 minutes of video time during this encounter. The call started at 1000 and ended at 10:30. The patient was located at home and the provider was located office.    Subjective:   Patient ID:  Brittany [name] is a 63 y.o. (DOB May 09, 1956) female.  Chief Complaint:  Chief Complaint  Patient presents with  . Follow-up    Medication Management  . Other    Bipolar 1  . Depression    Anxiety Symptoms include nervous/anxious behavior. Patient reports no confusion, decreased concentration or suicidal ideas.    Depression        Associated symptoms include no decreased concentration and no suicidal ideas.  Past medical history includes anxiety.    Brittany [name] presents to the office today for follow-up of long-term depression and anxiety and insomnia.    At visit September 08, 2018 at which time the patient was weaned off of gabapentin and started on Trileptal up to twice daily.  She has been very med sensitive and difficult to treat because of that.  When seen September 19, 2018.  No meds were changed.  She stopped oxcarbazapine on her own DT itching.  Last seen July 2020.  She did not want to try any additional medications despite ongoing depression.  She wanted to stop lithium despite risk of worsening depression.  She was given instructions about how to do it in the safest manner possible.  Got significantly better in depression since here.  Prayed a lot about it and thought the lithium was helping.  Feels better now off it.  Was going through a lot at the time.  But after a month more peace and joy.  Anxiety is under control with diazepam 5 mg 1/2-1 BID plus prn CBD.  Buspirone helped.  Diazepam better than Xanax which she feels triggered dissociation and irritabilty.  Severely depressed over dog loss and can't see family.  Good day Monday and so far today. Lost dog March 9 and anxiety and depression is through the roof.  Lost a real child before and losing dog of 14 years is just as painful.  Takes every thing in me to get me going some days.  Doesn't want to start new meds right now. Forces herself to do things.  She wants to stop all meds bc doesn't think any meds will help.  Doesn't think anything will get better until Covid resolves.  She's gone off and on buspirone several times and thinks it does help anxiety.  She found a CBD product which helped anxiety as well as Xanax.  Had usually dissociation  with anxiety and got through a wedding much better than usual.    Been 5 days out of mirtazapine DT out of stock.  Hardly slept without it.    Busy crazy mind and can get thoughts that drive up her anxiety.  Feels she needs Xanax and buspirone and Doxazosin helps sleep and nightmares.  Claims she doesn't take Xanax daily.  Can still go 2-3 days without much sleep at times and gets tired and irritable.  Asks for more sleep meds example temazepam. Claims she can handle the depression better than panic attacks.   Lost her dog which is hard.  Prayed and that  helped.    Worries about weight gain with meds.  Till problems with thinking and concentrating and mind want slow down.  Sometimes tornadic thoughts.  Random thoughts racing.  Hard to read and understand.  Stopped therapy about September.  Multiple med failures. , restoril, diazepam,  No more Xanax DT dissociation and irritability,  Trazodone NR, Ambien, hyrdroxyzine, doxazosin, propranolol, mirtazapine  sertraline, fluoxetine NR, Lexapro,  Buspar,  Latuda, Trileptal itching, lithium NR, Olanzapine, CBZ SE, Lamotrigine, risperidone with AE,  gabapentin, Seroquel SE, VPA, Geodon, Saphris, Abilify, Rexulti seemed to help for short while but $,  Review of Systems:  Review of Systems  Constitutional: Negative for unexpected weight change.  HENT: Positive for congestion.   Musculoskeletal: Positive for back pain. Negative for gait problem.  Neurological: Negative for tremors and weakness.  Psychiatric/Behavioral: Positive for depression and dysphoric mood. Negative for agitation, behavioral problems, confusion, decreased concentration, hallucinations, self-injury, sleep disturbance and suicidal ideas. The patient is nervous/anxious. The patient is not hyperactive.        Please refer to HPI  Stress eating.  Medications: I have reviewed the patient's current medications.  Current Outpatient Medications  Medication Sig Dispense Refill  . Albuterol Sulfate (PROAIR RESPICLICK) 272 (90 Base) MCG/ACT AEPB Inhale 2 puffs into the lungs every 6 (six) hours as needed. 1 each 1  . busPIRone (BUSPAR) 15 MG tablet Take 1 tablet (15 mg total) by mouth 3 (three) times daily. 270 tablet 0  . cetirizine (ZYRTEC) 10 MG tablet Take 10 mg by mouth daily.    . Cholecalciferol (VITAMIN D-3 PO) Take 5,000 Units by mouth daily.     . colestipol (COLESTID) 1 g tablet TAKE 2 TABLETS BY MOUTH ONCE DAILY FOR 7 DAYS AND THEN INCREASE TO 2 TWICE DAILY THEREAFTER (Patient taking differently: TAKE 2 TABLETS BY MOUTH  ONCE DAILY) 120 tablet 0  . diazepam (VALIUM) 5 MG tablet TAKE 1 TABLET BY MOUTH EVERY 8 HOURS AS NEEDED FOR ANXIETY 90 tablet 2  . doxazosin (CARDURA) 4 MG tablet Take 1.5 tablets (6 mg total) by mouth at bedtime. 135 tablet 1  . famotidine (PEPCID) 20 MG tablet Take 20 mg by mouth 2 (two) times daily.    . fluticasone (FLONASE) 50 MCG/ACT nasal spray Use 2 spray(s) in each nostril once daily 16 g 3  . fluticasone (FLOVENT HFA) 44 MCG/ACT inhaler Inhale 2 puffs into the lungs 2 (two) times a day. 3 Inhaler 1  . mirtazapine (REMERON) 15 MG tablet TAKE 1 TABLET BY MOUTH AT BEDTIME 30 tablet 2  . montelukast (SINGULAIR) 10 MG tablet TAKE 1 TABLET BY MOUTH AT BEDTIME 90 tablet 0   No current facility-administered medications for this visit.     Medication Side Effects: None  Allergies:  Allergies  Allergen Reactions  . Aspirin Other (See Comments)    discomfort  .  Codeine Itching  . Sulfamethoxazole-Trimethoprim Other (See Comments)    Unknown reaction per pt  . Trileptal [Oxcarbazepine]     itching  . Trimethoprim Other (See Comments)    Unknown, per pt  . Ultram [Tramadol] Itching  . Vioxx [Rofecoxib]     Unknown per pt    Past Medical History:  Diagnosis Date  . Abnormal EKG   . Anxiety   . Asthma   . Bipolar depression ([place]), Anxiety, PTSD, Panic disorder    -managed by [facility]  . Depression   . Foot pain   . GERD (gastroesophageal reflux disease)   . Heart murmur    as a child  . Insomnia   . Leg swelling   . Osteoporosis   . Tachycardia     Family History  Problem Relation Age of Onset  . Heart failure Mother   . Hypertension Mother   . Hyperlipidemia Mother   . Other Father        killed  . COPD Paternal Aunt        alot of aunts and uncle COPD or Emphysema  . Emphysema Paternal Uncle   . Colon polyps Maternal Aunt   . COPD Brother   . Heart disease Brother   . Pulmonary embolism Brother   . Breast cancer Neg Hx   . Colon cancer Neg Hx    . Esophageal cancer Neg Hx   . Pancreatic cancer Neg Hx   . Stomach cancer Neg Hx   . Liver disease Neg Hx     Social History   Socioeconomic History  . Marital status: Single    Spouse name: Not on file  . Number of children: 3  . Years of education: Not on file  . Highest education level: Not on file  Occupational History  . Occupation: disabled  Social Needs  . Financial resource strain: Not on file  . Food insecurity    Worry: Not on file    Inability: Not on file  . Transportation needs    Medical: Not on file    Non-medical: Not on file  Tobacco Use  . Smoking status: Former Smoker    Packs/day: 20.00    Years: 1.00    Pack years: 20.00    Quit date: 06/19/1999    Years since quitting: 19.8  . Smokeless tobacco: Never Used  . Tobacco comment: 20 year estimate but probably less   Substance and Sexual Activity  . Alcohol use: Yes    Alcohol/week: 0.0 standard drinks    Comment: Occass.   . Drug use: No  . Sexual activity: Never  Lifestyle  . Physical activity    Days per week: Not on file    Minutes per session: Not on file  . Stress: Not on file  Relationships  . Social Herbalist on phone: Not on file    Gets together: Not on file    Attends religious service: Not on file    Active member of club or organization: Not on file    Attends meetings of clubs or organizations: Not on file    Relationship status: Not on file  . Intimate partner violence    Fear of current or ex partner: Not on file    Emotionally abused: Not on file    Physically abused: Not on file    Forced sexual activity: Not on file  Other Topics Concern  . Not on file  Social History Narrative  Work or School: Disabled seconndary to psychiatric conditions      Home Situation: lives alone with 2 cats and one dog      Spiritual Beliefs: Christian      Lifestyle: no regular exercise, diet  Not great - wants to embark on healthier lifestyle    Past Medical History,  Surgical history, Social history, and Family history were reviewed and updated as appropriate.   Please see review of systems for further details on the patient's review from today.   Objective:   Physical Exam:  There were no vitals taken for this visit.  Physical Exam Neurological:     Mental Status: She is alert and oriented to person, place, and time.     Cranial Nerves: No dysarthria.  Psychiatric:        Attention and Perception: Attention normal.        Mood and Affect: Mood is not anxious or depressed. Affect is not angry.        Speech: Speech normal. Speech is not rapid and pressured.        Behavior: Behavior is cooperative.        Thought Content: Thought content normal. Thought content is not paranoid or delusional. Thought content does not include homicidal or suicidal ideation. Thought content does not include homicidal or suicidal plan.        Cognition and Memory: Cognition and memory normal.     Comments: Insight and judgment fair.  not overtly manic.  Reports depression and anxiety are much better.     Lab Review:     Component Value Date/Time   NA 139 01/27/2018 1148   K 4.7 01/27/2018 1148   CL 104 01/27/2018 1148   CO2 30 01/27/2018 1148   GLUCOSE 95 01/27/2018 1148   BUN 11 01/27/2018 1148   CREATININE 0.77 01/27/2018 1148   CALCIUM 9.5 01/27/2018 1148   PROT 5.7 (L) 04/21/2009 0400   ALBUMIN 3.2 (L) 04/21/2009 0400   AST 27 04/21/2009 0400   ALT 43 (H) 04/21/2009 0400   ALKPHOS 58 04/21/2009 0400   BILITOT 0.4 04/21/2009 0400   GFRNONAA >60 04/21/2009 0400   GFRAA  04/21/2009 0400    >60        The eGFR has been calculated using the MDRD equation. This calculation has not been validated in all clinical situations. eGFR's persistently <60 mL/min signify possible Chronic Kidney Disease.       Component Value Date/Time   WBC 4.6 01/27/2018 1148   RBC 4.57 01/27/2018 1148   HGB 14.3 01/27/2018 1148   HCT 42.2 01/27/2018 1148   PLT 311.0  01/27/2018 1148   MCV 92.4 01/27/2018 1148   MCHC 33.9 01/27/2018 1148   RDW 13.3 01/27/2018 1148    No results found for: POCLITH, LITHIUM   Lab Results  Component Value Date   VALPROATE 25.3 (L) 10/26/2008     .res Assessment: Plan:    Brittany [name] was seen today for follow-up, other and depression.  Diagnoses and all orders for this visit:  Bipolar 1 disorder, mixed, moderate (HCC)  PTSD (post-traumatic stress disorder)  Panic disorder with agoraphobia  Nightmares associated with chronic post-traumatic stress disorder  Chronic noncompliance complicates treatment. Likey borderline personality disorder.  We discussed her chronic sx of depression, anxiety and insomnia combined with medication sensitivity which makes it very difficult to treat her.  Multiple med failures.  Poor insight into bipolar and refuses mood stabilizers.  This was discussed with her repeatedly.  Guarded prognosis.  But her faith helps.  At the moment she reports improved mood and anxiety. Disc risk mood swings without suitable mood stabilizer but she refuses any new meds.  She remains at risk of mood swings as noted.  Consider Depakote.  Discussed the next option of   VPA.  She took  VPA 2010 but doesn't remember the effects.  Refuses.  She reports feeling better off the lithium although the improvement was 3 to 4 weeks of the lithium so is not clear that it actually was related to lithium.  In any case she is not worse.  We discussed the short-term risks associated with benzodiazepines including sedation and increased fall risk among others.  She reports better anxiety control with diazepam as opposed to Xanax as noted above.  She does not want to be prescribed Xanax at any point in the future.  Discussed long-term side effect risk including dependence, potential withdrawal symptoms, and the potential eventual dose-related risk of dementia.  Especially risky in PTSD bc of the high risk of tolerance in PTSD  patients.    Disc research showing high dosage gabapentin and Lyrica can sometimes help and has less risk of the above but her anxiety is better at the present with diazepam, buspirone, and CBD as needed.  She does not feel like she needs any med change for anxiety at this time..    NM controlled with doxazosin  No med changes today.  Continue buspirone 15 mg 3 times daily, Valium 2.5 or 5 mg twice daily, doxazosin 6 mg nightly for nightmares, and mirtazapine 15 mg nightly for sleep.  FU 3 mos  Brittany Parents, MD, DFAPA   Please see After Visit Summary for patient specific instructions.  Future Appointments  Date Time Provider [place]  05/20/2019  9:10 AM [place], MD [facility]    No orders of the defined types were placed in this encounter.     -------------------------------

## 2019-04-20 DIAGNOSIS — Z20828 Contact with and (suspected) exposure to other viral communicable diseases: Secondary | ICD-10-CM | POA: Diagnosis not present

## 2019-04-21 ENCOUNTER — Encounter (HOSPITAL_COMMUNITY): Payer: Self-pay | Admitting: Emergency Medicine

## 2019-04-21 ENCOUNTER — Emergency Department (HOSPITAL_COMMUNITY): Payer: PPO

## 2019-04-21 ENCOUNTER — Encounter: Payer: Self-pay | Admitting: Family Medicine

## 2019-04-21 ENCOUNTER — Telehealth: Payer: Self-pay | Admitting: *Deleted

## 2019-04-21 ENCOUNTER — Inpatient Hospital Stay (HOSPITAL_COMMUNITY)
Admission: EM | Admit: 2019-04-21 | Discharge: 2019-05-02 | DRG: 177 | Disposition: A | Discharge status: Home / Self Care | Payer: PPO | Attending: Internal Medicine | Admitting: Internal Medicine

## 2019-04-21 ENCOUNTER — Other Ambulatory Visit: Payer: Self-pay

## 2019-04-21 ENCOUNTER — Telehealth (INDEPENDENT_AMBULATORY_CARE_PROVIDER_SITE_OTHER): Payer: PPO | Admitting: Family Medicine

## 2019-04-21 DIAGNOSIS — J9601 Acute respiratory failure with hypoxia: Secondary | ICD-10-CM | POA: Diagnosis present

## 2019-04-21 DIAGNOSIS — Z825 Family history of asthma and other chronic lower respiratory diseases: Secondary | ICD-10-CM

## 2019-04-21 DIAGNOSIS — Z886 Allergy status to analgesic agent status: Secondary | ICD-10-CM

## 2019-04-21 DIAGNOSIS — F3130 Bipolar disorder, current episode depressed, mild or moderate severity, unspecified: Secondary | ICD-10-CM | POA: Diagnosis present

## 2019-04-21 DIAGNOSIS — E669 Obesity, unspecified: Secondary | ICD-10-CM | POA: Diagnosis present

## 2019-04-21 DIAGNOSIS — J1289 Other viral pneumonia: Secondary | ICD-10-CM | POA: Diagnosis present

## 2019-04-21 DIAGNOSIS — R0602 Shortness of breath: Secondary | ICD-10-CM

## 2019-04-21 DIAGNOSIS — U071 COVID-19: Secondary | ICD-10-CM | POA: Diagnosis present

## 2019-04-21 DIAGNOSIS — K219 Gastro-esophageal reflux disease without esophagitis: Secondary | ICD-10-CM | POA: Diagnosis present

## 2019-04-21 DIAGNOSIS — J1282 Pneumonia due to coronavirus disease 2019: Secondary | ICD-10-CM | POA: Diagnosis present

## 2019-04-21 DIAGNOSIS — A4189 Other specified sepsis: Secondary | ICD-10-CM | POA: Diagnosis present

## 2019-04-21 DIAGNOSIS — Z885 Allergy status to narcotic agent status: Secondary | ICD-10-CM

## 2019-04-21 DIAGNOSIS — J45909 Unspecified asthma, uncomplicated: Secondary | ICD-10-CM | POA: Diagnosis present

## 2019-04-21 DIAGNOSIS — M810 Age-related osteoporosis without current pathological fracture: Secondary | ICD-10-CM | POA: Diagnosis present

## 2019-04-21 DIAGNOSIS — K921 Melena: Secondary | ICD-10-CM | POA: Diagnosis present

## 2019-04-21 DIAGNOSIS — Z8349 Family history of other endocrine, nutritional and metabolic diseases: Secondary | ICD-10-CM

## 2019-04-21 DIAGNOSIS — F410 Panic disorder [episodic paroxysmal anxiety] without agoraphobia: Secondary | ICD-10-CM | POA: Diagnosis present

## 2019-04-21 DIAGNOSIS — R5383 Other fatigue: Secondary | ICD-10-CM

## 2019-04-21 DIAGNOSIS — D72819 Decreased white blood cell count, unspecified: Secondary | ICD-10-CM | POA: Diagnosis present

## 2019-04-21 DIAGNOSIS — Z20822 Contact with and (suspected) exposure to covid-19: Secondary | ICD-10-CM

## 2019-04-21 DIAGNOSIS — Z87891 Personal history of nicotine dependence: Secondary | ICD-10-CM | POA: Diagnosis not present

## 2019-04-21 DIAGNOSIS — Z0184 Encounter for antibody response examination: Secondary | ICD-10-CM | POA: Diagnosis not present

## 2019-04-21 DIAGNOSIS — R0600 Dyspnea, unspecified: Secondary | ICD-10-CM | POA: Diagnosis not present

## 2019-04-21 DIAGNOSIS — Z20828 Contact with and (suspected) exposure to other viral communicable diseases: Secondary | ICD-10-CM

## 2019-04-21 DIAGNOSIS — Z7951 Long term (current) use of inhaled steroids: Secondary | ICD-10-CM

## 2019-04-21 DIAGNOSIS — F419 Anxiety disorder, unspecified: Secondary | ICD-10-CM | POA: Diagnosis present

## 2019-04-21 DIAGNOSIS — R102 Pelvic and perineal pain: Secondary | ICD-10-CM | POA: Diagnosis present

## 2019-04-21 DIAGNOSIS — E785 Hyperlipidemia, unspecified: Secondary | ICD-10-CM | POA: Diagnosis present

## 2019-04-21 DIAGNOSIS — Z881 Allergy status to other antibiotic agents status: Secondary | ICD-10-CM

## 2019-04-21 DIAGNOSIS — F4310 Post-traumatic stress disorder, unspecified: Secondary | ICD-10-CM | POA: Diagnosis present

## 2019-04-21 DIAGNOSIS — R011 Cardiac murmur, unspecified: Secondary | ICD-10-CM | POA: Diagnosis present

## 2019-04-21 DIAGNOSIS — Z6832 Body mass index (BMI) 32.0-32.9, adult: Secondary | ICD-10-CM

## 2019-04-21 DIAGNOSIS — Z79899 Other long term (current) drug therapy: Secondary | ICD-10-CM

## 2019-04-21 DIAGNOSIS — Z114 Encounter for screening for human immunodeficiency virus [HIV]: Secondary | ICD-10-CM

## 2019-04-21 DIAGNOSIS — R05 Cough: Secondary | ICD-10-CM | POA: Diagnosis not present

## 2019-04-21 LAB — COMPREHENSIVE METABOLIC PANEL
ALT: 19 U/L (ref 0–44)
AST: 13 U/L — ABNORMAL LOW (ref 15–41)
Albumin: 3 g/dL — ABNORMAL LOW (ref 3.5–5.0)
Alkaline Phosphatase: 58 U/L (ref 38–126)
Anion gap: 14 (ref 5–15)
BUN: 5 mg/dL — ABNORMAL LOW (ref 8–23)
CO2: 22 mmol/L (ref 22–32)
Calcium: 8.5 mg/dL — ABNORMAL LOW (ref 8.9–10.3)
Chloride: 103 mmol/L (ref 98–111)
Creatinine, Ser: 0.8 mg/dL (ref 0.44–1.00)
GFR calc Af Amer: >60 mL/min (ref >60)
GFR calc non Af Amer: >60 mL/min (ref >60)
Glucose, Bld: 110 mg/dL — ABNORMAL HIGH (ref 70–99)
Potassium: 3.8 mmol/L (ref 3.5–5.1)
Sodium: 139 mmol/L (ref 135–145)
Total Bilirubin: 0.2 mg/dL — ABNORMAL LOW (ref 0.3–1.2)
Total Protein: 6.2 g/dL — ABNORMAL LOW (ref 6.5–8.1)

## 2019-04-21 LAB — TYPE AND SCREEN
ABO/RH(D): O POS
Antibody Screen: NEGATIVE

## 2019-04-21 LAB — CBC
HCT: 41 % (ref 36.0–46.0)
Hemoglobin: 13.3 g/dL (ref 12.0–15.0)
MCH: 29.6 pg (ref 26.0–34.0)
MCHC: 32.4 g/dL (ref 30.0–36.0)
MCV: 91.3 fL (ref 80.0–100.0)
Platelets: 261 10*3/uL (ref 150–400)
RBC: 4.49 MIL/uL (ref 3.87–5.11)
RDW: 13.4 % (ref 11.5–15.5)
WBC: 3.4 10*3/uL — ABNORMAL LOW (ref 4.0–10.5)
nRBC: 0 % (ref 0.0–0.2)

## 2019-04-21 LAB — ABO/RH: ABO/RH(D): O POS

## 2019-04-21 NOTE — Telephone Encounter (Signed)
I called the pt and she stated she checked her O2 at home and the reading was 84%.  Stated she is treated by Dr Harold Hedge for asthma. I advised the pt this number is low and advised she call Dr Lucianne Lei [facility] office as soon as possible as he is her specialist.  Patient asked me to look back and see what her O2 readings were here previously as she questioned if this could be due to [place], since she had a test and is awaiting results.  I advised the pt regardless of what her readings were previously, it is urgent that she call the specialist office.

## 2019-04-21 NOTE — ED Notes (Signed)
Sats improved to 93% on 3L [place]

## 2019-04-21 NOTE — ED Triage Notes (Signed)
Pt states she started having a congested cough last week. Then started feeling SOB. Pt has had some intermittent n/v. Black tarry stool. Pt complains of feeling dizzy and having fevers. Sats 88% on room air.

## 2019-04-21 NOTE — Telephone Encounter (Signed)
I called the pt and informed her of the message below.  Patient prefers to have a virtual visit and this was scheduled for today at 3:40pm.

## 2019-04-21 NOTE — ED Notes (Signed)
Hope RN made aware of O2 sats of 88% on RA

## 2019-04-21 NOTE — Telephone Encounter (Signed)
Copied from [place] [id number]. Topic: General - Other >> Apr 21, 2019 10:31 AM Celene Kras A wrote: Reason for CRM: Pt called and is requesting to know what a normal oxygen reading should look like for her since she is asthmatic. Please advise.

## 2019-04-21 NOTE — Telephone Encounter (Signed)
Yes. This is very low and do advise prompt evaluation with specialist or at [facility] or ER. Happy to call or video her if you block a spot - have a pt now. But she should not wait to seek care if is feeling bad and has that O2 reading.

## 2019-04-21 NOTE — Telephone Encounter (Signed)
Pt asked if Prednisone can be called in to help with her breathing. Pt asked for a call from Dr. Maudie Mercury, Dr. Ethlyn Gallery or Denice Paradise anne/ please advise

## 2019-04-21 NOTE — Progress Notes (Signed)
Virtual Visit via Video Note  I connected with Brittany [name]  on 04/21/19 at  3:40 PM EST by a video enabled telemedicine application and verified that I am speaking with the correct person using two identifiers.  Location patient: home Location provider:work or home office Persons participating in the virtual visit: patient, provider  I discussed the limitations of evaluation and management by telemedicine and the availability of in person appointments. The patient expressed understanding and agreed to proceed.   HPI:  Acute visit for Dyspnea: -she had a likely COVID19 exposure recently - see prior notes, she is awaiting the [facility] covid 19 result -she has not felt well today, cough and breathing have worsened, pulse ox 88, also stools have been really black and soft intermittently with recurrence today -she has been have SOB, worsening cough, fevers up to 101 intermittently, fatigue, diarrhea -she has had difficulty getting up out of bed the last few days  ROS: See pertinent positives and negatives per HPI.  Past Medical History:  Diagnosis Date  . Abnormal EKG   . Anxiety   . Asthma   . Bipolar depression ([place]), Anxiety, PTSD, Panic disorder    -managed by [facility]  . Depression   . Foot pain   . GERD (gastroesophageal reflux disease)   . Heart murmur    as a child  . Insomnia   . Leg swelling   . Osteoporosis   . Tachycardia     Past Surgical History:  Procedure Laterality Date  . CARPAL TUNNEL RELEASE Left   . CESAREAN SECTION     x 2  . CHOLECYSTECTOMY    . ENDOMETRIAL ABLATION    . KNEE SURGERY Right    reconstruction for patellar dislocation  . LAPAROSCOPY     x 2  . TUBAL LIGATION    . ULNAR NERVE REPAIR Left     Family History  Problem Relation Age of Onset  . Heart failure Mother   . Hypertension Mother   . Hyperlipidemia Mother   . Other Father        killed  . COPD Paternal Aunt        alot of aunts and uncle COPD or Emphysema   . Emphysema Paternal Uncle   . Colon polyps Maternal Aunt   . COPD Brother   . Heart disease Brother   . Pulmonary embolism Brother   . Breast cancer Neg Hx   . Colon cancer Neg Hx   . Esophageal cancer Neg Hx   . Pancreatic cancer Neg Hx   . Stomach cancer Neg Hx   . Liver disease Neg Hx     SOCIAL HX: see hpi   Current Outpatient Medications:  .  Albuterol Sulfate (PROAIR RESPICLICK) [id number] (90 Base) MCG/ACT AEPB, Inhale 2 puffs into the lungs every 6 (six) hours as needed., Disp: 1 each, Rfl: 1 .  busPIRone (BUSPAR) 15 MG tablet, Take 1 tablet (15 mg total) by mouth 3 (three) times daily., Disp: 270 tablet, Rfl: 0 .  cetirizine (ZYRTEC) 10 MG tablet, Take 10 mg by mouth daily., Disp: , Rfl:  .  Cholecalciferol (VITAMIN D-3 PO), Take 5,000 Units by mouth daily. , Disp: , Rfl:  .  colestipol (COLESTID) 1 g tablet, TAKE 2 TABLETS BY MOUTH ONCE DAILY FOR 7 DAYS AND THEN INCREASE TO 2 TWICE DAILY THEREAFTER (Patient taking differently: TAKE 2 TABLETS BY MOUTH ONCE DAILY), Disp: 120 tablet, Rfl: 0 .  diazepam (VALIUM) 5 MG tablet, TAKE  1 TABLET BY MOUTH EVERY 8 HOURS AS NEEDED FOR ANXIETY, Disp: 90 tablet, Rfl: 2 .  doxazosin (CARDURA) 4 MG tablet, Take 1.5 tablets (6 mg total) by mouth at bedtime., Disp: 135 tablet, Rfl: 1 .  famotidine (PEPCID) 20 MG tablet, Take 20 mg by mouth 2 (two) times daily., Disp: , Rfl:  .  fluticasone (FLONASE) 50 MCG/ACT nasal spray, Use 2 spray(s) in each nostril once daily, Disp: 16 g, Rfl: 3 .  fluticasone (FLOVENT HFA) 44 MCG/ACT inhaler, Inhale 2 puffs into the lungs 2 (two) times a day., Disp: 3 Inhaler, Rfl: 1 .  mirtazapine (REMERON) 15 MG tablet, TAKE 1 TABLET BY MOUTH AT BEDTIME, Disp: 30 tablet, Rfl: 2 .  montelukast (SINGULAIR) 10 MG tablet, TAKE 1 TABLET BY MOUTH AT BEDTIME, Disp: 90 tablet, Rfl: 0  EXAM:  VITALS per patient if applicable: pulse ox [id number]  GENERAL: alert, oriented, appears tired  HEENT: atraumatic, conjunttiva clear, no obvious  abnormalities on inspection of external nose and ears, appears paler today with some possible greyness around the mouth and nose  NECK: normal movements of the head and neck  LUNGS: mild shortness of breath with talking, o/w RR appears normal over video call, no gasping or wheezing during the call, no tripoding  MS: moves all visible extremities without noticeable abnormality  PSYCH/NEURO: pleasant and cooperative, no obvious depression or anxiety, speech and thought processing grossly intact  ASSESSMENT AND PLAN:  Discussed the following assessment and plan:  Dyspnea, unspecified type  Black stools  Lethargy  Exposure to COVID-19 virus  -we discussed possible serious and likely etiologies, options for evaluation and workup, limitations of telemedicine visit vs in person visit, treatment, treatment risks and precautions. Pt prefers to treat via telemedicine empirically rather then risking or undertaking an in person visit at this moment. However, I am worried about the lethargy, dyspnea, dark stools and worsening after possible COVID19 exposure in a patient with asthma and advised referral for urgent inperson evaluation. Given she is feel so weak with dyspnea and low O2 sats advised and offered to arrange for EMS transport to the Emergency Room. She declines and prefers daughter to take her and agrees to go. Advised clinical staff to call ER to notify them of this patient case.   I discussed the assessment and treatment plan with the patient. The patient was provided an opportunity to ask questions and all were answered. The patient agreed with the plan and demonstrated an understanding of the instructions.    Lucretia Kern, DO

## 2019-04-22 ENCOUNTER — Inpatient Hospital Stay (HOSPITAL_COMMUNITY): Payer: PPO

## 2019-04-22 DIAGNOSIS — Z825 Family history of asthma and other chronic lower respiratory diseases: Secondary | ICD-10-CM | POA: Diagnosis not present

## 2019-04-22 DIAGNOSIS — J1282 Pneumonia due to coronavirus disease 2019: Secondary | ICD-10-CM | POA: Diagnosis present

## 2019-04-22 DIAGNOSIS — J1289 Other viral pneumonia: Secondary | ICD-10-CM

## 2019-04-22 DIAGNOSIS — F410 Panic disorder [episodic paroxysmal anxiety] without agoraphobia: Secondary | ICD-10-CM | POA: Diagnosis present

## 2019-04-22 DIAGNOSIS — K921 Melena: Secondary | ICD-10-CM | POA: Diagnosis present

## 2019-04-22 DIAGNOSIS — Z8349 Family history of other endocrine, nutritional and metabolic diseases: Secondary | ICD-10-CM | POA: Diagnosis not present

## 2019-04-22 DIAGNOSIS — M810 Age-related osteoporosis without current pathological fracture: Secondary | ICD-10-CM | POA: Diagnosis present

## 2019-04-22 DIAGNOSIS — U071 COVID-19: Secondary | ICD-10-CM

## 2019-04-22 DIAGNOSIS — J45909 Unspecified asthma, uncomplicated: Secondary | ICD-10-CM | POA: Diagnosis present

## 2019-04-22 DIAGNOSIS — F3130 Bipolar disorder, current episode depressed, mild or moderate severity, unspecified: Secondary | ICD-10-CM | POA: Diagnosis present

## 2019-04-22 DIAGNOSIS — Z20828 Contact with and (suspected) exposure to other viral communicable diseases: Secondary | ICD-10-CM

## 2019-04-22 DIAGNOSIS — F419 Anxiety disorder, unspecified: Secondary | ICD-10-CM | POA: Diagnosis present

## 2019-04-22 DIAGNOSIS — Z881 Allergy status to other antibiotic agents status: Secondary | ICD-10-CM | POA: Diagnosis not present

## 2019-04-22 DIAGNOSIS — A4189 Other specified sepsis: Secondary | ICD-10-CM | POA: Diagnosis present

## 2019-04-22 DIAGNOSIS — Z6832 Body mass index (BMI) 32.0-32.9, adult: Secondary | ICD-10-CM | POA: Diagnosis not present

## 2019-04-22 DIAGNOSIS — R102 Pelvic and perineal pain: Secondary | ICD-10-CM

## 2019-04-22 DIAGNOSIS — K219 Gastro-esophageal reflux disease without esophagitis: Secondary | ICD-10-CM | POA: Diagnosis present

## 2019-04-22 DIAGNOSIS — F4310 Post-traumatic stress disorder, unspecified: Secondary | ICD-10-CM | POA: Diagnosis present

## 2019-04-22 DIAGNOSIS — E669 Obesity, unspecified: Secondary | ICD-10-CM | POA: Diagnosis present

## 2019-04-22 DIAGNOSIS — Z87891 Personal history of nicotine dependence: Secondary | ICD-10-CM | POA: Diagnosis not present

## 2019-04-22 DIAGNOSIS — Z0184 Encounter for antibody response examination: Secondary | ICD-10-CM | POA: Diagnosis not present

## 2019-04-22 DIAGNOSIS — E785 Hyperlipidemia, unspecified: Secondary | ICD-10-CM | POA: Diagnosis present

## 2019-04-22 DIAGNOSIS — J9601 Acute respiratory failure with hypoxia: Secondary | ICD-10-CM | POA: Diagnosis present

## 2019-04-22 DIAGNOSIS — Z20822 Contact with and (suspected) exposure to covid-19: Secondary | ICD-10-CM | POA: Diagnosis present

## 2019-04-22 DIAGNOSIS — R011 Cardiac murmur, unspecified: Secondary | ICD-10-CM | POA: Diagnosis present

## 2019-04-22 DIAGNOSIS — Z886 Allergy status to analgesic agent status: Secondary | ICD-10-CM | POA: Diagnosis not present

## 2019-04-22 DIAGNOSIS — R0602 Shortness of breath: Secondary | ICD-10-CM | POA: Diagnosis present

## 2019-04-22 DIAGNOSIS — D72819 Decreased white blood cell count, unspecified: Secondary | ICD-10-CM | POA: Diagnosis present

## 2019-04-22 DIAGNOSIS — Z114 Encounter for screening for human immunodeficiency virus [HIV]: Secondary | ICD-10-CM

## 2019-04-22 HISTORY — DX: COVID-19: U07.1

## 2019-04-22 HISTORY — DX: Pneumonia due to coronavirus disease 2019: J12.82

## 2019-04-22 LAB — URINALYSIS, ROUTINE W REFLEX MICROSCOPIC
Bilirubin Urine: NEGATIVE
Glucose, UA: NEGATIVE mg/dL
Hgb urine dipstick: NEGATIVE
Ketones, ur: NEGATIVE mg/dL
Nitrite: NEGATIVE
Protein, ur: 30 mg/dL — AB
Specific Gravity, Urine: 1.013 (ref 1.005–1.030)
pH: 6 (ref 5.0–8.0)

## 2019-04-22 LAB — TRIGLYCERIDES: Triglycerides: 40 mg/dL (ref <150)

## 2019-04-22 LAB — POC OCCULT BLOOD, ED: Fecal Occult Bld: NEGATIVE

## 2019-04-22 LAB — LACTATE DEHYDROGENASE: LDH: 321 U/L — ABNORMAL HIGH (ref 98–192)

## 2019-04-22 LAB — PROCALCITONIN: Procalcitonin: <0.1 ng/mL

## 2019-04-22 LAB — C-REACTIVE PROTEIN: CRP: 15.1 mg/dL — ABNORMAL HIGH (ref <1.0)

## 2019-04-22 LAB — FERRITIN: Ferritin: 208 ng/mL (ref 11–307)

## 2019-04-22 LAB — SARS CORONAVIRUS 2 (TAT 6-24 HRS): SARS Coronavirus 2: POSITIVE — AB

## 2019-04-22 LAB — FIBRINOGEN: Fibrinogen: >800 mg/dL — ABNORMAL HIGH (ref 210–475)

## 2019-04-22 LAB — LACTIC ACID, PLASMA: Lactic Acid, Venous: 0.7 mmol/L (ref 0.5–1.9)

## 2019-04-22 LAB — D-DIMER, QUANTITATIVE: D-Dimer, Quant: 0.99 ug/mL-FEU — ABNORMAL HIGH (ref 0.00–0.50)

## 2019-04-22 MED ORDER — ENOXAPARIN SODIUM 40 MG/0.4ML ~~LOC~~ SOLN: 40.0000 mg | SUBCUTANEOUS | Status: DC

## 2019-04-22 MED ORDER — ALBUTEROL SULFATE HFA 108 (90 BASE) MCG/ACT IN AERS
8.0000 | INHALATION_SPRAY | RESPIRATORY_TRACT | Status: DC | PRN
  Administered 2019-04-22: 8 via RESPIRATORY_TRACT
  Filled 2019-04-22: qty 6.7

## 2019-04-22 MED ORDER — SODIUM CHLORIDE 0.9 % IV SOLN
INTRAVENOUS | Status: AC
  Administered 2019-04-22: 12:00:00 via INTRAVENOUS

## 2019-04-22 MED ORDER — ACETAMINOPHEN 325 MG PO TABS
650.0000 mg | ORAL_TABLET | Freq: Four times a day (QID) | ORAL | Status: DC | PRN
  Administered 2019-04-26 – 2019-04-30 (×7): 650 mg via ORAL
  Filled 2019-04-22 (×8): qty 2

## 2019-04-22 MED ORDER — ACETAMINOPHEN 500 MG PO TABS
1000.0000 mg | ORAL_TABLET | Freq: Once | ORAL | Status: AC
  Administered 2019-04-22: 05:00:00 1000 mg via ORAL
  Filled 2019-04-22: qty 2

## 2019-04-22 MED ORDER — POTASSIUM CHLORIDE CRYS ER 20 MEQ PO TBCR
30.0000 meq | EXTENDED_RELEASE_TABLET | Freq: Once | ORAL | Status: AC
  Administered 2019-04-22: 30 meq via ORAL
  Filled 2019-04-22: qty 1

## 2019-04-22 MED ORDER — PROCHLORPERAZINE EDISYLATE 10 MG/2ML IJ SOLN
5.0000 mg | Freq: Four times a day (QID) | INTRAMUSCULAR | Status: DC | PRN
  Administered 2019-04-23 – 2019-04-24 (×2): 5 mg via INTRAVENOUS
  Filled 2019-04-22 (×2): qty 2

## 2019-04-22 MED ORDER — MIRTAZAPINE 15 MG PO TABS
15.0000 mg | ORAL_TABLET | Freq: Every day | ORAL | Status: DC
  Administered 2019-04-23 – 2019-05-01 (×9): 15 mg via ORAL
  Filled 2019-04-22 (×10): qty 1

## 2019-04-22 MED ORDER — ZINC SULFATE 220 (50 ZN) MG PO CAPS
220.0000 mg | ORAL_CAPSULE | Freq: Every day | ORAL | Status: DC
  Administered 2019-04-23 – 2019-05-02 (×10): 220 mg via ORAL
  Filled 2019-04-22 (×11): qty 1

## 2019-04-22 MED ORDER — VITAMIN C 500 MG PO TABS
500.0000 mg | ORAL_TABLET | Freq: Every day | ORAL | Status: DC
  Administered 2019-04-22 – 2019-05-02 (×11): 500 mg via ORAL
  Filled 2019-04-22 (×11): qty 1

## 2019-04-22 MED ORDER — GUAIFENESIN-DM 100-10 MG/5ML PO SYRP
10.0000 mL | ORAL_SOLUTION | ORAL | Status: DC | PRN
  Administered 2019-04-23 – 2019-04-29 (×3): 10 mL via ORAL
  Filled 2019-04-22 (×4): qty 10

## 2019-04-22 MED ORDER — SODIUM CHLORIDE 0.9 % IV SOLN
200.0000 mg | Freq: Once | INTRAVENOUS | Status: AC
  Administered 2019-04-22: 200 mg via INTRAVENOUS
  Filled 2019-04-22: qty 40

## 2019-04-22 MED ORDER — DIAZEPAM 5 MG PO TABS
5.0000 mg | ORAL_TABLET | Freq: Three times a day (TID) | ORAL | Status: DC | PRN
  Administered 2019-04-23 – 2019-05-02 (×18): 5 mg via ORAL
  Filled 2019-04-22 (×20): qty 1

## 2019-04-22 MED ORDER — HEPARIN SODIUM (PORCINE) 5000 UNIT/ML IJ SOLN
5000.0000 [IU] | Freq: Three times a day (TID) | INTRAMUSCULAR | Status: DC
  Administered 2019-04-22 – 2019-04-23 (×3): 5000 [IU] via SUBCUTANEOUS
  Filled 2019-04-22 (×5): qty 1

## 2019-04-22 MED ORDER — DEXAMETHASONE SODIUM PHOSPHATE 10 MG/ML IJ SOLN
6.0000 mg | INTRAMUSCULAR | Status: DC
  Administered 2019-04-22 – 2019-04-26 (×5): 6 mg via INTRAVENOUS
  Filled 2019-04-22 (×5): qty 1

## 2019-04-22 MED ORDER — SODIUM CHLORIDE 0.9 % IV SOLN
100.0000 mg | INTRAVENOUS | Status: AC
  Administered 2019-04-23 – 2019-04-26 (×4): 100 mg via INTRAVENOUS
  Filled 2019-04-22 (×4): qty 20

## 2019-04-22 MED ORDER — FAMOTIDINE 20 MG PO TABS
20.0000 mg | ORAL_TABLET | Freq: Two times a day (BID) | ORAL | Status: DC
  Administered 2019-04-23 – 2019-05-02 (×19): 20 mg via ORAL
  Filled 2019-04-22 (×21): qty 1

## 2019-04-22 MED ORDER — BUSPIRONE HCL 15 MG PO TABS
15.0000 mg | ORAL_TABLET | Freq: Three times a day (TID) | ORAL | Status: DC
  Administered 2019-04-22 – 2019-05-02 (×29): 15 mg via ORAL
  Filled 2019-04-22 (×14): qty 1
  Filled 2019-04-22: qty 2
  Filled 2019-04-22 (×18): qty 1

## 2019-04-22 MED ORDER — IOHEXOL 350 MG/ML SOLN
100.0000 mL | Freq: Once | INTRAVENOUS | Status: AC | PRN
  Administered 2019-04-22: 100 mL via INTRAVENOUS

## 2019-04-22 NOTE — Progress Notes (Signed)
PROGRESS NOTE    Brittany [name]  [id number] DOB: 1956/05/21 DOA: 04/21/2019 PCP: Caren Macadam, MD    Brief Narrative:  63 y.o. female with medical history significant of asthma, depression, anxiety, bipolar disorder, PTSD, panic disorder, GERD presenting with a chief complaint of shortness of breath.  Patient reports 1 week history of dyspnea, fevers, and cough.  Symptoms have been getting progressively worse.  Reports exposure to son-in-law who is Covid positive.  She has been feeling nauseous but has not vomited.  For the past 1 week she has noticed that her stool is black in color.  She is having suprapubic abdominal pain.  No dysuria, urinary frequency, or urgency.  No epigastric abdominal pain.  She was previously taking ibuprofen 800 to 1200 mg a day for back pain but has recently cut down on her use.  Does report taking Pepto-Bismol this past week for nausea.  ED Course: Oxygen saturation 88% on room air, improved with 3 L supplemental oxygen.  Febrile with temperature 101.5 F, tachycardic, and tachypneic.  Not hypotensive.  White blood cell count 3.4.  Lactic acid normal.  SARS-CoV-2 test pending.  Procalcitonin, LDH, ferritin, triglycerides, fibrinogen, and CRP pending.  Blood culture x2 pending.  Stool guaiac negative.  Hemoglobin stable 13.3.  D-dimer elevated at 0.99.  Chest x-ray showing new ill-defined areas of airspace opacity in the left lung, suspicious for viral or other atypical pneumonia. Patient received Tylenol and albuterol MDI treatment.  Assessment & Plan:   Principal Problem:   Suspected COVID-19 virus infection Active Problems:   Melena   Suprapubic abdominal pain   Encounter for screening for HIV   Pneumonia due to COVID-19 virus   Sepsis and acute hypoxic respiratory failure secondary to confirmed COVID-19 viral pneumonia -Patient reports exposure to a family member with COVID-19.   -Oxygen saturation 88% on room air, improved with 3 L  supplemental oxygen.   -Presented febrile, tachycardic, and tachypneic.  - RP 15.1, fibrinogen above 800, and D-dimer 0.99. Chest x-ray showing new ill-defined areas of airspace opacity in the left lung, suspicious for viral or other atypical pneumonia. -Chest CTA pending -IV Decadron 6 mg daily -COVID test returned pos, anticipate starting remdesivir. Have placed pharmacy consult -Now on Vitamin C and zinc -Airborne and contact precautions  Dark stools -Stools are heme negative -EGD done October 2019 with normal appearance of esophagus, stomach, and duodenum.  Gastric biopsy showing chronic inactive mild gastritis.    -Patient does report using Pepto-Bismol over the past 1 week which can also make stools dark. -Hgb remains stable at over 13  Suprapubic abdominal pain -Denies UTI symptoms. -UA unremarkable, reviewed  HIV screening -HIV testing pending  QT prolongation on EKG -Continue on cardiac monitoring -Keep potassium above 4 and magnesium above 2 -Avoid QT prolonging drugs if possible  DVT prophylaxis: Heparin subq Code Status: Full Family Communication: Pt in room, family not at bedside Disposition Plan: Uncertain at this time  Consultants:     Procedures:     Antimicrobials: Anti-infectives (From admission, onward)   None       Subjective: Reports feeling better while on O2  Objective: Vitals:   04/22/19 1130 04/22/19 1145 04/22/19 1146 04/22/19 1215  BP: 110/62 110/62  122/70  Pulse: 98 (!) 104  (!) 101  Resp: (!) 33 20  (!) 21  Temp:   97.9 F (36.6 C)   TempSrc:  Oral Axillary   SpO2: 92% 91%  93%  Weight:  Height:       No intake or output data in the 24 hours ending 04/22/19 1453 Filed Weights   04/21/19 1708  Weight: 78.9 kg    Examination:  General exam: Appears calm and comfortable  Respiratory system: no audible wheezing. Respiratory effort normal. Cardiovascular system: regular, perfused Gastrointestinal system:  nondistended, soft Central nervous system: Alert and oriented. No focal neurological deficits. Extremities: Symmetric 5 x 5 power. Skin: No rashes, lesions Psychiatry: Judgement and insight appear normal. Mood & affect appropriate.   Data Reviewed: I have personally reviewed following labs and imaging studies  CBC: Recent Labs  Lab 04/21/19 1721  WBC 3.4*  HGB 13.3  HCT 41.0  MCV 91.3  PLT 0000000   Basic Metabolic Panel: Recent Labs  Lab 04/21/19 1721  NA 139  K 3.8  CL 103  CO2 22  GLUCOSE 110*  BUN 5*  CREATININE 0.80  CALCIUM 8.5*   GFR: Estimated Creatinine Clearance: 68.4 mL/min (by C-G formula based on SCr of 0.8 mg/dL). Liver Function Tests: Recent Labs  Lab 04/21/19 1721  AST 13*  ALT 19  ALKPHOS 58  BILITOT 0.2*  PROT 6.2*  ALBUMIN 3.0*   No results for input(s): LIPASE, AMYLASE in the last 168 hours. No results for input(s): AMMONIA in the last 168 hours. Coagulation Profile: No results for input(s): INR, PROTIME in the last 168 hours. Cardiac Enzymes: No results for input(s): CKTOTAL, CKMB, CKMBINDEX, TROPONINI in the last 168 hours. BNP (last 3 results) No results for input(s): PROBNP in the last 8760 hours. HbA1C: No results for input(s): HGBA1C in the last 72 hours. CBG: No results for input(s): GLUCAP in the last 168 hours. Lipid Profile: Recent Labs    04/22/19 0248  TRIG 40   Thyroid Function Tests: No results for input(s): TSH, T4TOTAL, FREET4, T3FREE, THYROIDAB in the last 72 hours. Anemia Panel: Recent Labs    04/22/19 0248  FERRITIN 208   Sepsis Labs: Recent Labs  Lab 04/22/19 0248  PROCALCITON <0.10  LATICACIDVEN 0.7    Recent Results (from the past 240 hour(s))  SARS CORONAVIRUS 2 (TAT 6-24 HRS) Nasopharyngeal Nasopharyngeal Swab     Status: Abnormal   Collection Time: 04/22/19  2:46 AM   Specimen: Nasopharyngeal Swab  Result Value Ref Range Status   SARS Coronavirus 2 POSITIVE (A) NEGATIVE Final    Comment:  RESULT CALLED TO, READ BACK BY AND VERIFIED WITH: Precious Reel RN 14:30 04/22/19 ([email or web address]) (NOTE) SARS-CoV-2 target nucleic acids are DETECTED. The SARS-CoV-2 RNA is generally detectable in upper and lower respiratory specimens during the acute phase of infection. Positive results are indicative of active infection with SARS-CoV-2. Clinical  correlation with patient history and other diagnostic information is necessary to determine patient infection status. Positive results do  not rule out bacterial infection or co-infection with other viruses. The expected result is Negative. Fact Sheet for Patients: SugarRoll.be Fact Sheet for Healthcare Providers: https://www.woods-mathews.com/ This test is not yet approved or cleared by the [place] FDA and  has been authorized for detection and/or diagnosis of SARS-CoV-2 by FDA under an Emergency Use Authorization (EUA). This EUA will remain  in effect (meaning this test can be used) for  the duration of the COVID-19 declaration under Section 564(b)(1) of the Act, 21 U.S.C. section 360bbb-3(b)(1), unless the authorization is terminated or revoked sooner. Performed at [facility], [place] [address], [place], [place] [address]      Radiology Studies: Dg Chest [place] [address]  Result Date: 04/21/2019 CLINICAL DATA:  Shortness of breath and cough. Former smoker. EXAM: PORTABLE CHEST 1 VIEW COMPARISON:  06/21/2016 FINDINGS: Heart size is at the upper limits of normal. Asymmetric ill-defined areas of airspace opacity are seen in the left lung which are new, and suspicious for viral or other atypical pneumonia. No evidence of pleural effusion. IMPRESSION: New ill-defined areas of airspace opacity in left lung, suspicious for viral or other atypical pneumonia. Electronically Signed   By: Marlaine Hind M.D.   On: 04/21/2019 20:07    Scheduled Meds: . busPIRone  15 mg Oral TID  . dexamethasone (DECADRON)  injection  6 mg Intravenous Q24H  . famotidine  20 mg Oral BID  . heparin injection (subcutaneous)  5,000 Units Subcutaneous Q8H  . vitamin C  500 mg Oral Daily  . zinc sulfate  220 mg Oral Daily   Continuous Infusions: . sodium chloride       LOS: 0 days   Marylu Lund, MD [facility] Pager On Amion  If 7PM-7AM, please contact night-coverage 04/22/2019, 2:53 PM

## 2019-04-22 NOTE — ED Notes (Signed)
[facility]/Transfer to [facility] @ 1614-per Marguarite Arbour, RN called by Levada Dy

## 2019-04-22 NOTE — ED Notes (Signed)
Pt daughter Glenard Haring [phone]

## 2019-04-22 NOTE — Progress Notes (Signed)
Pharmacy Note - Remdesivir Dosing  O:  ALT: 19 CXR (11/3): opacity in left lung, suspicious for viral or atypical pneumonia  Requiring supplemental O2: 2L [place]   A: 63 yo female tested positive for COVID-19 on 04/22/2019. Patient meets criteria for remdesivir. Will receive first dose of remdesivir at [facility] ED before transferring to [place].   P:  Begin remdesivir 200 mg IV x 1, followed by 100 mg IV daily x 4 days  Monitor ALT, clinical progress  Cristela Felt, PharmD PGY1 Pharmacy Resident [employer]: [phone]   04/22/2019 4:57 PM

## 2019-04-22 NOTE — ED Notes (Signed)
Report given to Maudie Mercury, RN at [employer].

## 2019-04-22 NOTE — ED Notes (Signed)
Heart healthy lunch tray ordered 

## 2019-04-22 NOTE — ED Provider Notes (Signed)
[facility] EMERGENCY DEPARTMENT Provider Note   CSN: [id number] Arrival date & time: 04/21/19  1655     History   Chief Complaint Chief Complaint  Patient presents with  . GI Bleeding  . Cough    HPI Brittany [name] is a 63 y.o. female.     Patient with history of asthma, HLD, bipolar, presents with URI symptoms that started last week (x7 days) of congestion, sore throat, cough. Through the past week she has developed fever, SOB with increased wheezing, fatigue, generalized weakness and body aches. She states it has been hard to use her inhalers because breathing in the medication is hard to do. She reports recent exposure to COVID positive person within the 2 weeks prior to the onset of her symptoms. She borrowed a pulse oximeter from a family member and found her oxygen to be in the 80's at home prompting ED visit. She also reports that her stools have turned dark/black. No history of GI bleeding. She states she hurt her back about 3 weeks ago and has been taking ibuprofen regularly since that time.   The history is provided by the patient. No language interpreter was used.  Cough Associated symptoms: chills, fever, headaches, shortness of breath, sore throat and wheezing     Past Medical History:  Diagnosis Date  . Abnormal EKG   . Anxiety   . Asthma   . Bipolar depression ([place]), Anxiety, PTSD, Panic disorder    -managed by [facility]  . Depression   . Foot pain   . GERD (gastroesophageal reflux disease)   . Heart murmur    as a child  . Insomnia   . Leg swelling   . Osteoporosis   . Tachycardia     Patient Active Problem List   Diagnosis Date Noted  . Osteopenia 12/04/2016  . Chronic post-traumatic stress disorder (PTSD) 08/23/2016  . Bipolar affective disorder, current episode depressed ([place]) 08/23/2016  . Hyperlipidemia 08/23/2016  . COPD / AB vs ACOS 06/22/2016  . Cough variant asthma  vs UACS  06/21/2016  . GERD  04/03/2007    Past Surgical History:  Procedure Laterality Date  . CARPAL TUNNEL RELEASE Left   . CESAREAN SECTION     x 2  . CHOLECYSTECTOMY    . ENDOMETRIAL ABLATION    . KNEE SURGERY Right    reconstruction for patellar dislocation  . LAPAROSCOPY     x 2  . TUBAL LIGATION    . ULNAR NERVE REPAIR Left      OB History    Gravida  3   Para  3   Term  3   Preterm      AB      Living  2     SAB      TAB      Ectopic      Multiple      Live Births  3            Home Medications    Prior to Admission medications   Medication Sig Start Date End Date Taking? Authorizing Provider  Albuterol Sulfate (PROAIR RESPICLICK) [id number] (90 Base) MCG/ACT AEPB Inhale 2 puffs into the lungs every 6 (six) hours as needed. 10/09/18   Lucretia Kern, DO  busPIRone (BUSPAR) 15 MG tablet Take 1 tablet (15 mg total) by mouth 3 (three) times daily. 01/15/19   Cottle, Billey Co., MD  cetirizine (ZYRTEC) 10 MG tablet Take 10 mg  by mouth daily.    [provider]  Cholecalciferol (VITAMIN D-3 PO) Take 5,000 Units by mouth daily.     [provider]  colestipol (COLESTID) 1 g tablet TAKE 2 TABLETS BY MOUTH ONCE DAILY FOR 7 DAYS AND THEN INCREASE TO 2 TWICE DAILY THEREAFTER Patient taking differently: TAKE 2 TABLETS BY MOUTH ONCE DAILY 03/23/19   Thornton Park, MD  diazepam (VALIUM) 5 MG tablet TAKE 1 TABLET BY MOUTH EVERY 8 HOURS AS NEEDED FOR ANXIETY 03/11/19   Cottle, Billey Co., MD  doxazosin (CARDURA) 4 MG tablet Take 1.5 tablets (6 mg total) by mouth at bedtime. 01/15/19   Cottle, Billey Co., MD  famotidine (PEPCID) 20 MG tablet Take 20 mg by mouth 2 (two) times daily.    [provider]  fluticasone Asencion Islam) 50 MCG/ACT nasal spray Use 2 spray(s) in each nostril once daily 10/21/18   Lucretia Kern, DO  fluticasone (FLOVENT HFA) 44 MCG/ACT inhaler Inhale 2 puffs into the lungs 2 (two) times a day. 10/28/18   Lucretia Kern, DO  mirtazapine (REMERON) 15 MG  tablet TAKE 1 TABLET BY MOUTH AT BEDTIME 03/11/19   Cottle, Billey Co., MD  montelukast (SINGULAIR) 10 MG tablet TAKE 1 TABLET BY MOUTH AT BEDTIME 10/09/18   Lucretia Kern, DO    Family History Family History  Problem Relation Age of Onset  . Heart failure Mother   . Hypertension Mother   . Hyperlipidemia Mother   . Other Father        killed  . COPD Paternal Aunt        alot of aunts and uncle COPD or Emphysema  . Emphysema Paternal Uncle   . Colon polyps Maternal Aunt   . COPD Brother   . Heart disease Brother   . Pulmonary embolism Brother   . Breast cancer Neg Hx   . Colon cancer Neg Hx   . Esophageal cancer Neg Hx   . Pancreatic cancer Neg Hx   . Stomach cancer Neg Hx   . Liver disease Neg Hx     Social History Social History   Tobacco Use  . Smoking status: Former Smoker    Packs/day: 20.00    Years: 1.00    Pack years: 20.00    Quit date: 06/19/1999    Years since quitting: 19.8  . Smokeless tobacco: Never Used  . Tobacco comment: 20 year estimate but probably less   Substance Use Topics  . Alcohol use: Yes    Alcohol/week: 0.0 standard drinks    Comment: Occass.   . Drug use: No     Allergies   Aspirin, Codeine, Sulfamethoxazole-trimethoprim, Trileptal [oxcarbazepine], Trimethoprim, Ultram [tramadol], and Vioxx [rofecoxib]   Review of Systems Review of Systems  Constitutional: Positive for activity change, chills, fatigue and fever.  HENT: Positive for congestion and sore throat.   Respiratory: Positive for cough, chest tightness, shortness of breath and wheezing.   Cardiovascular: Negative.   Gastrointestinal: Positive for nausea. Negative for vomiting.  Musculoskeletal: Positive for back pain (ONgoing).  Skin: Negative.   Neurological: Positive for weakness and headaches.     Physical Exam Updated Vital Signs BP 130/81   Pulse (!) 103   Temp 99 F (37.2 C) (Oral)   Resp (!) 26   Ht 5\' 1"  (1.549 m)   Wt 78.9 kg   SpO2 96%   BMI 32.88  kg/m   Physical Exam Vitals signs and nursing note reviewed.  Constitutional:      Appearance: She is well-developed.  HENT:     Head: Normocephalic.  Neck:     Musculoskeletal: Normal range of motion and neck supple.  Cardiovascular:     Rate and Rhythm: Normal rate and regular rhythm.  Pulmonary:     Effort: Pulmonary effort is normal. No respiratory distress.     Breath sounds: Wheezing present. No rales.     Comments: Poor inspiratory effort Faint wheezes bilateral bases. Abdominal:     General: Bowel sounds are normal.     Palpations: Abdomen is soft.     Tenderness: There is no abdominal tenderness. There is no guarding or rebound.  Musculoskeletal: Normal range of motion.  Skin:    General: Skin is warm and dry.     Findings: No rash.  Neurological:     Mental Status: She is alert and oriented to person, place, and time.      ED Treatments / Results  Labs (all labs ordered are listed, but only abnormal results are displayed) Labs Reviewed  COMPREHENSIVE METABOLIC PANEL - Abnormal; Notable for the following components:      Result Value   Glucose, Bld 110 (*)    BUN 5 (*)    Calcium 8.5 (*)    Total Protein 6.2 (*)    Albumin 3.0 (*)    AST 13 (*)    Total Bilirubin 0.2 (*)    All other components within normal limits  CBC - Abnormal; Notable for the following components:   WBC 3.4 (*)    All other components within normal limits  SARS CORONAVIRUS 2 (TAT 6-24 HRS)  CULTURE, BLOOD (ROUTINE X 2)  CULTURE, BLOOD (ROUTINE X 2)  LACTIC ACID, PLASMA  LACTIC ACID, PLASMA  D-DIMER, QUANTITATIVE (NOT AT [facility])  PROCALCITONIN  LACTATE DEHYDROGENASE  FERRITIN  TRIGLYCERIDES  FIBRINOGEN  C-REACTIVE PROTEIN  POC OCCULT BLOOD, ED  TYPE AND SCREEN  ABO/RH   Results for orders placed or performed during the hospital encounter of 04/21/19  Comprehensive metabolic panel  Result Value Ref Range   Sodium 139 135 - 145 mmol/L   Potassium 3.8 3.5 - 5.1 mmol/L    Chloride 103 98 - 111 mmol/L   CO2 22 22 - 32 mmol/L   Glucose, Bld 110 (H) 70 - 99 mg/dL   BUN 5 (L) 8 - 23 mg/dL   Creatinine, Ser 0.80 0.44 - 1.00 mg/dL   Calcium 8.5 (L) 8.9 - 10.3 mg/dL   Total Protein 6.2 (L) 6.5 - 8.1 g/dL   Albumin 3.0 (L) 3.5 - 5.0 g/dL   AST 13 (L) 15 - 41 U/L   ALT 19 0 - 44 U/L   Alkaline Phosphatase 58 38 - 126 U/L   Total Bilirubin 0.2 (L) 0.3 - 1.2 mg/dL   GFR calc non Af Amer >60 >60 mL/min   GFR calc Af Amer >60 >60 mL/min   Anion gap 14 5 - 15  CBC  Result Value Ref Range   WBC 3.4 (L) 4.0 - 10.5 K/uL   RBC 4.49 3.87 - 5.11 MIL/uL   Hemoglobin 13.3 12.0 - 15.0 g/dL   HCT 41.0 36.0 - 46.0 %   MCV 91.3 80.0 - 100.0 fL   MCH 29.6 26.0 - 34.0 pg   MCHC 32.4 30.0 - 36.0 g/dL   RDW 13.4 11.5 - 15.5 %   Platelets 261 150 - 400 K/uL   nRBC 0.0 0.0 - 0.2 %  Lactic acid,  plasma  Result Value Ref Range   Lactic Acid, Venous 0.7 0.5 - 1.9 mmol/L  D-dimer, quantitative  Result Value Ref Range   D-Dimer, Quant 0.99 (H) 0.00 - 0.50 ug/mL-FEU  Ferritin  Result Value Ref Range   Ferritin 208 11 - 307 ng/mL  Triglycerides  Result Value Ref Range   Triglycerides 40 <150 mg/dL  C-reactive protein  Result Value Ref Range   CRP 15.1 (H) <1.0 mg/dL  Type and screen [place]  Result Value Ref Range   ABO/RH(D) O POS    Antibody Screen NEG    Sample Expiration      04/24/2019,2359 Performed at [facility] [address] [place], [place] [address]   ABO/Rh  Result Value Ref Range   ABO/RH(D)      O POS Performed at [facility] [address] [place], [place] [address]    EKG EKG Interpretation  Date/Time:  Tuesday April 21 2019 17:07:07 EST Ventricular Rate:  95 PR Interval:  120 QRS Duration: 130 QT Interval:  402 QTC Calculation: 505 R Axis:   -86 Text Interpretation: Normal sinus rhythm Left axis deviation Right bundle branch block Abnormal ECG No significant change since last tracing Confirmed by  Merrily Pew [phone]) on 04/22/2019 2:18:43 AM   Radiology Dg Chest Port 1 View  Result Date: 04/21/2019 CLINICAL DATA:  Shortness of breath and cough. Former smoker. EXAM: PORTABLE CHEST 1 VIEW COMPARISON:  06/21/2016 FINDINGS: Heart size is at the upper limits of normal. Asymmetric ill-defined areas of airspace opacity are seen in the left lung which are new, and suspicious for viral or other atypical pneumonia. No evidence of pleural effusion. IMPRESSION: New ill-defined areas of airspace opacity in left lung, suspicious for viral or other atypical pneumonia. Electronically Signed   By: Marlaine Hind M.D.   On: 04/21/2019 20:07    Procedures Procedures (including critical care time)  Medications Ordered in ED Medications - No data to display   Initial Impression / Assessment and Plan / ED Course  I have reviewed the triage vital signs and the nursing notes.  Pertinent labs & imaging results that were available during my care of the patient were reviewed by me and considered in my medical decision making (see chart for details).        Patient to ED with symptoms c/w COVID-19 after known positive exposure, including SOB, hypoxia, cough, fever, aches, weakness, sore throat and headache.  Sats found to be 88% on 2 L in the ED. She is on 4L O2 and saturations are in the mid-90's. She continues to feel short of breath. Albuterol inhaler (8 puffs) ordered. She is mildly tachypneic but in no apparent distress.  CXR c/w atypical or viral pneumonia.   Patient reports she is having dark/black stools, however, guaiac is found to be negative.   Feel the patient is clinically positive for COVID. Nasal swab pending. Discussed admission with Dr. Marlowe Sax, [facility], who accepts the patient onto her service.   Final Clinical Impressions(s) / ED Diagnoses   Final diagnoses:  SOB (shortness of breath)   1. Atypical pneumonia 2. Known COVID exposure 3. Hypoxia   ED Discharge Orders    None        Charlann Lange, PA-C 04/22/19 0525    Merryl Hacker, MD 04/22/19 [phone]

## 2019-04-22 NOTE — H&P (Signed)
History and Physical    Brittany [name] [id number] DOB: 11/13/1955 DOA: 04/21/2019  PCP: Caren Macadam, MD Patient coming from: Home  Chief Complaint: Shortness of breath  HPI: Brittany [name] is a 63 y.o. female with medical history significant of asthma, depression, anxiety, bipolar disorder, PTSD, panic disorder, GERD presenting with a chief complaint of shortness of breath.  Patient reports 1 week history of dyspnea, fevers, and cough.  Symptoms have been getting progressively worse.  Reports exposure to son-in-law who is Covid positive.  She has been feeling nauseous but has not vomited.  For the past 1 week she has noticed that her stool is black in color.  She is having suprapubic abdominal pain.  No dysuria, urinary frequency, or urgency.  No epigastric abdominal pain.  She was previously taking ibuprofen 800 to 1200 mg a day for back pain but has recently cut down on her use.  Does report taking Pepto-Bismol this past week for nausea.  ED Course: Oxygen saturation 88% on room air, improved with 3 L supplemental oxygen.  Febrile with temperature 101.5 F, tachycardic, and tachypneic.  Not hypotensive.  White blood cell count 3.4.  Lactic acid normal.  SARS-CoV-2 test pending.  Procalcitonin, LDH, ferritin, triglycerides, fibrinogen, and CRP pending.  Blood culture x2 pending.  Stool guaiac negative.  Hemoglobin stable 13.3.  D-dimer elevated at 0.99.  Chest x-ray showing new ill-defined areas of airspace opacity in the left lung, suspicious for viral or other atypical pneumonia. Patient received Tylenol and albuterol MDI treatment.  Review of Systems:  All systems reviewed and apart from history of presenting illness, are negative.  Past Medical History:  Diagnosis Date  . Abnormal EKG   . Anxiety   . Asthma   . Bipolar depression ([place]), Anxiety, PTSD, Panic disorder    -managed by [facility]  . Depression   . Foot pain   . GERD (gastroesophageal  reflux disease)   . Heart murmur    as a child  . Insomnia   . Leg swelling   . Osteoporosis   . Tachycardia     Past Surgical History:  Procedure Laterality Date  . CARPAL TUNNEL RELEASE Left   . CESAREAN SECTION     x 2  . CHOLECYSTECTOMY    . ENDOMETRIAL ABLATION    . KNEE SURGERY Right    reconstruction for patellar dislocation  . LAPAROSCOPY     x 2  . TUBAL LIGATION    . ULNAR NERVE REPAIR Left      reports that she quit smoking about 19 years ago. She has a 20.00 pack-year smoking history. She has never used smokeless tobacco. She reports current alcohol use. She reports that she does not use drugs.  Allergies  Allergen Reactions  . Aspirin Other (See Comments)    discomfort  . Codeine Itching  . Sulfamethoxazole-Trimethoprim Other (See Comments)    Unknown reaction per pt  . Trileptal [Oxcarbazepine]     itching  . Trimethoprim Other (See Comments)    Unknown, per pt  . Ultram [Tramadol] Itching  . Vioxx [Rofecoxib]     Unknown per pt    Family History  Problem Relation Age of Onset  . Heart failure Mother   . Hypertension Mother   . Hyperlipidemia Mother   . Other Father        killed  . COPD Paternal Aunt        alot of aunts and uncle COPD or Emphysema  .  Emphysema Paternal Uncle   . Colon polyps Maternal Aunt   . COPD Brother   . Heart disease Brother   . Pulmonary embolism Brother   . Breast cancer Neg Hx   . Colon cancer Neg Hx   . Esophageal cancer Neg Hx   . Pancreatic cancer Neg Hx   . Stomach cancer Neg Hx   . Liver disease Neg Hx     Prior to Admission medications   Medication Sig Start Date End Date Taking? Authorizing Provider  Albuterol Sulfate (PROAIR RESPICLICK) 123XX123 (90 Base) MCG/ACT AEPB Inhale 2 puffs into the lungs every 6 (six) hours as needed. 10/09/18   Lucretia Kern, DO  busPIRone (BUSPAR) 15 MG tablet Take 1 tablet (15 mg total) by mouth 3 (three) times daily. 01/15/19   Cottle, Billey Co., MD  cetirizine (ZYRTEC) 10  MG tablet Take 10 mg by mouth daily.    [provider]  Cholecalciferol (VITAMIN D-3 PO) Take 5,000 Units by mouth daily.     [provider]  colestipol (COLESTID) 1 g tablet TAKE 2 TABLETS BY MOUTH ONCE DAILY FOR 7 DAYS AND THEN INCREASE TO 2 TWICE DAILY THEREAFTER Patient taking differently: TAKE 2 TABLETS BY MOUTH ONCE DAILY 03/23/19   Thornton Park, MD  diazepam (VALIUM) 5 MG tablet TAKE 1 TABLET BY MOUTH EVERY 8 HOURS AS NEEDED FOR ANXIETY 03/11/19   Cottle, Billey Co., MD  doxazosin (CARDURA) 4 MG tablet Take 1.5 tablets (6 mg total) by mouth at bedtime. 01/15/19   Cottle, Billey Co., MD  famotidine (PEPCID) 20 MG tablet Take 20 mg by mouth 2 (two) times daily.    [provider]  fluticasone Asencion Islam) 50 MCG/ACT nasal spray Use 2 spray(s) in each nostril once daily 10/21/18   Lucretia Kern, DO  fluticasone (FLOVENT HFA) 44 MCG/ACT inhaler Inhale 2 puffs into the lungs 2 (two) times a day. 10/28/18   Lucretia Kern, DO  mirtazapine (REMERON) 15 MG tablet TAKE 1 TABLET BY MOUTH AT BEDTIME 03/11/19   Cottle, Billey Co., MD  montelukast (SINGULAIR) 10 MG tablet TAKE 1 TABLET BY MOUTH AT BEDTIME 10/09/18   Lucretia Kern, DO    Physical Exam: Vitals:   04/22/19 0245 04/22/19 0345 04/22/19 0415 04/22/19 0418  BP: 125/71 130/68 107/66   Pulse: 99 (!) 103 (!) 103   Resp: (!) 29 (!) 29 (!) 29   Temp:    (!) 101.5 F (38.6 C)  TempSrc:    Oral  SpO2: 97% 98% 98%   Weight:      Height:        Physical Exam  Constitutional: She is oriented to person, place, and time. She appears well-developed and well-nourished.  HENT:  Head: Normocephalic.  Eyes: Right eye exhibits no discharge. Left eye exhibits no discharge.  Neck: Neck supple.  Cardiovascular: Normal rate, regular rhythm and intact distal pulses.  Pulmonary/Chest: She has no wheezes.  Slightly tachypneic On 3 L supplemental oxygen via nasal cannula Coarse breath sounds bilaterally  Abdominal: Soft.  Bowel sounds are normal. She exhibits no distension. There is no abdominal tenderness. There is no guarding.  Musculoskeletal:        General: No edema.  Neurological: She is alert and oriented to person, place, and time.  Skin: Skin is warm and dry. She is not diaphoretic.     Labs on Admission: I have personally reviewed following labs and imaging studies  CBC: Recent Labs  Lab 04/21/19 1721  WBC 3.4*  HGB 13.3  HCT 41.0  MCV 91.3  PLT 0000000   Basic Metabolic Panel: Recent Labs  Lab 04/21/19 1721  NA 139  K 3.8  CL 103  CO2 22  GLUCOSE 110*  BUN 5*  CREATININE 0.80  CALCIUM 8.5*   GFR: Estimated Creatinine Clearance: 68.4 mL/min (by C-G formula based on SCr of 0.8 mg/dL). Liver Function Tests: Recent Labs  Lab 04/21/19 1721  AST 13*  ALT 19  ALKPHOS 58  BILITOT 0.2*  PROT 6.2*  ALBUMIN 3.0*   No results for input(s): LIPASE, AMYLASE in the last 168 hours. No results for input(s): AMMONIA in the last 168 hours. Coagulation Profile: No results for input(s): INR, PROTIME in the last 168 hours. Cardiac Enzymes: No results for input(s): CKTOTAL, CKMB, CKMBINDEX, TROPONINI in the last 168 hours. BNP (last 3 results) No results for input(s): PROBNP in the last 8760 hours. HbA1C: No results for input(s): HGBA1C in the last 72 hours. CBG: No results for input(s): GLUCAP in the last 168 hours. Lipid Profile: Recent Labs    04/22/19 0248  TRIG 40   Thyroid Function Tests: No results for input(s): TSH, T4TOTAL, FREET4, T3FREE, THYROIDAB in the last 72 hours. Anemia Panel: Recent Labs    04/22/19 0248  FERRITIN 208   Urine analysis:    Component Value Date/Time   COLORURINE YELLOW 02/04/2009 1831   APPEARANCEUR CLEAR 02/04/2009 1831   LABSPEC 1.014 02/04/2009 1831   PHURINE 7.0 02/04/2009 1831   GLUCOSEU NEGATIVE 02/04/2009 1831   HGBUR NEGATIVE 02/04/2009 1831   BILIRUBINUR NEGATIVE 02/04/2009 1831   KETONESUR NEGATIVE 02/04/2009 1831   PROTEINUR  NEGATIVE 02/04/2009 1831   UROBILINOGEN 0.2 02/04/2009 1831   NITRITE NEGATIVE 02/04/2009 1831   LEUKOCYTESUR SMALL (A) 02/04/2009 1831    Radiological Exams on Admission: Dg Chest Port 1 View  Result Date: 04/21/2019 CLINICAL DATA:  Shortness of breath and cough. Former smoker. EXAM: PORTABLE CHEST 1 VIEW COMPARISON:  06/21/2016 FINDINGS: Heart size is at the upper limits of normal. Asymmetric ill-defined areas of airspace opacity are seen in the left lung which are new, and suspicious for viral or other atypical pneumonia. No evidence of pleural effusion. IMPRESSION: New ill-defined areas of airspace opacity in left lung, suspicious for viral or other atypical pneumonia. Electronically Signed   By: Marlaine Hind M.D.   On: 04/21/2019 20:07    EKG: Independently reviewed.  Sinus rhythm, RBBB, QTC 505.  QTc increased since prior tracing.  Assessment/Plan Principal Problem:   Suspected COVID-19 virus infection Active Problems:   Melena   Suprapubic abdominal pain   Encounter for screening for HIV   Sepsis and acute hypoxic respiratory failure secondary to suspected COVID-19 viral pneumonia Patient reports exposure to a family member with COVID-19.  Oxygen saturation 88% on room air, improved with 3 L supplemental oxygen.  Febrile, tachycardic, and tachypneic.  Blood pressure stable.  Slightly leukopenic.  Lactic acid normal.  Inflammatory markers significantly elevated.  CRP 15.1, fibrinogen above 800, and D-dimer 0.99. Chest x-ray showing new ill-defined areas of airspace opacity in the left lung, suspicious for viral or other atypical pneumonia. -Chest CTA for further evaluation, rule out PE -Cautious IV fluid hydration for sepsis.  Goal is to maintain net negative fluid balance given suspicion for COVID-19 viral pneumonia.  Avoid volume overload. -IV Decadron 6 mg daily -SARS-CoV-2 test pending.  If positive, start remdesivir and transfer patient to [facility] campus. -Vitamin C and  zinc  -Airborne and contact precautions -Continuous pulse ox -Supplemental oxygen to keep oxygen saturation above 92% -Procalcitonin and LDH pending -Blood culture x2 pending  Melena EGD done October 2019 with normal appearance of esophagus, stomach, and duodenum.  Gastric biopsy showing chronic inactive mild gastritis.  Patient does report NSAID use however work-up so far reassuring.  Hemoglobin stable at 13.3.  Blood pressure stable.  Stool guaiac negative.  No epigastric abdominal pain.  Patient does report using Pepto-Bismol over the past 1 week which can also make stools dark. -Continue to monitor  Suprapubic abdominal pain Denies UTI symptoms.  Abdominal exam benign. -Check UA  HIV screening The patient falls between the ages of 13-64 and should be screened for HIV, therefore HIV testing ordered.  QT prolongation on EKG -Cardiac monitoring -Keep potassium above 4 and magnesium above 2 -Repeat EKG in a.m. -Avoid QT prolonging drugs if possible  Pharmacy med rec pending.  DVT prophylaxis: Subcutaneous heparin Code Status: Full code Family Communication: No family available. Disposition Plan: Anticipate discharge after clinical improvement. Consults called: None Admission status: It is my clinical opinion that admission to INPATIENT is reasonable and necessary in this 63 y.o. female . presenting with sepsis and acute hypoxic respiratory failure secondary to suspected COVID-19 viral pneumonia.  High risk of decompensation.  Given the aforementioned, the predictability of an adverse outcome is felt to be significant. I expect that the patient will require at least 2 midnights in the hospital to treat this condition.   The medical decision making on this patient was of high complexity and the patient is at high risk for clinical deterioration, therefore this is a level 3 visit.  Shela Leff MD [facility] Pager [phone]  If 7PM-7AM, please contact night-coverage  [email or web address] Password [facility]  04/22/2019, 6:37 AM

## 2019-04-23 ENCOUNTER — Other Ambulatory Visit: Payer: Self-pay

## 2019-04-23 LAB — CBC
HCT: 43.7 % (ref 36.0–46.0)
Hemoglobin: 14 g/dL (ref 12.0–15.0)
MCH: 29.6 pg (ref 26.0–34.0)
MCHC: 32 g/dL (ref 30.0–36.0)
MCV: 92.4 fL (ref 80.0–100.0)
Platelets: 339 10*3/uL (ref 150–400)
RBC: 4.73 MIL/uL (ref 3.87–5.11)
RDW: 13.9 % (ref 11.5–15.5)
WBC: 5.6 10*3/uL (ref 4.0–10.5)
nRBC: 0 % (ref 0.0–0.2)

## 2019-04-23 LAB — COMPREHENSIVE METABOLIC PANEL
ALT: 31 U/L (ref 0–44)
AST: 43 U/L — ABNORMAL HIGH (ref 15–41)
Albumin: 3.4 g/dL — ABNORMAL LOW (ref 3.5–5.0)
Alkaline Phosphatase: 74 U/L (ref 38–126)
Anion gap: 9 (ref 5–15)
BUN: 11 mg/dL (ref 8–23)
CO2: 25 mmol/L (ref 22–32)
Calcium: 8.8 mg/dL — ABNORMAL LOW (ref 8.9–10.3)
Chloride: 104 mmol/L (ref 98–111)
Creatinine, Ser: 0.59 mg/dL (ref 0.44–1.00)
GFR calc Af Amer: >60 mL/min (ref >60)
GFR calc non Af Amer: >60 mL/min (ref >60)
Glucose, Bld: 96 mg/dL (ref 70–99)
Potassium: 4.2 mmol/L (ref 3.5–5.1)
Sodium: 138 mmol/L (ref 135–145)
Total Bilirubin: 0.5 mg/dL (ref 0.3–1.2)
Total Protein: 7.3 g/dL (ref 6.5–8.1)

## 2019-04-23 LAB — FERRITIN: Ferritin: 398 ng/mL — ABNORMAL HIGH (ref 11–307)

## 2019-04-23 LAB — D-DIMER, QUANTITATIVE: D-Dimer, Quant: 1.63 ug/mL-FEU — ABNORMAL HIGH (ref 0.00–0.50)

## 2019-04-23 LAB — DIFFERENTIAL
Abs Immature Granulocytes: 0.02 10*3/uL (ref 0.00–0.07)
Basophils Absolute: 0 10*3/uL (ref 0.0–0.1)
Basophils Relative: 0 %
Eosinophils Absolute: 0 10*3/uL (ref 0.0–0.5)
Eosinophils Relative: 0 %
Immature Granulocytes: 0 %
Lymphocytes Relative: 9 %
Lymphs Abs: 0.5 10*3/uL — ABNORMAL LOW (ref 0.7–4.0)
Monocytes Absolute: 0.4 10*3/uL (ref 0.1–1.0)
Monocytes Relative: 7 %
Neutro Abs: 4.7 10*3/uL (ref 1.7–7.7)
Neutrophils Relative %: 84 %

## 2019-04-23 LAB — HIV ANTIBODY (ROUTINE TESTING W REFLEX): HIV Screen 4th Generation wRfx: NONREACTIVE

## 2019-04-23 LAB — MAGNESIUM: Magnesium: 2.1 mg/dL (ref 1.7–2.4)

## 2019-04-23 LAB — C-REACTIVE PROTEIN: CRP: 11.4 mg/dL — ABNORMAL HIGH (ref <1.0)

## 2019-04-23 MED ORDER — ENOXAPARIN SODIUM 40 MG/0.4ML ~~LOC~~ SOLN
40.0000 mg | SUBCUTANEOUS | Status: DC
  Administered 2019-04-23 – 2019-04-28 (×6): 40 mg via SUBCUTANEOUS
  Filled 2019-04-23 (×6): qty 0.4

## 2019-04-23 MED ORDER — DOXAZOSIN MESYLATE 4 MG PO TABS
6.0000 mg | ORAL_TABLET | Freq: Every day | ORAL | Status: DC
  Administered 2019-04-23 – 2019-05-01 (×9): 6 mg via ORAL
  Filled 2019-04-23 (×10): qty 1

## 2019-04-23 MED ORDER — FLUTICASONE PROPIONATE 50 MCG/ACT NA SUSP
2.0000 | Freq: Every day | NASAL | Status: DC
  Administered 2019-04-23 – 2019-05-02 (×10): 2 via NASAL
  Filled 2019-04-23: qty 16

## 2019-04-23 MED ORDER — MONTELUKAST SODIUM 10 MG PO TABS
10.0000 mg | ORAL_TABLET | Freq: Every day | ORAL | Status: DC
  Administered 2019-04-23 – 2019-05-01 (×9): 10 mg via ORAL
  Filled 2019-04-23 (×9): qty 1

## 2019-04-23 MED ORDER — BENZONATATE 100 MG PO CAPS
200.0000 mg | ORAL_CAPSULE | Freq: Three times a day (TID) | ORAL | Status: DC
  Administered 2019-04-23 – 2019-05-02 (×27): 200 mg via ORAL
  Filled 2019-04-23 (×27): qty 2

## 2019-04-23 MED ORDER — ALBUTEROL SULFATE HFA 108 (90 BASE) MCG/ACT IN AERS
1.0000 | INHALATION_SPRAY | RESPIRATORY_TRACT | Status: DC | PRN
  Filled 2019-04-23: qty 6.7

## 2019-04-23 MED ORDER — COLESTIPOL HCL 1 G PO TABS
1.0000 g | ORAL_TABLET | Freq: Two times a day (BID) | ORAL | Status: DC
  Administered 2019-04-23 – 2019-05-02 (×19): 1 g via ORAL
  Filled 2019-04-23 (×21): qty 1

## 2019-04-23 MED ORDER — FLUTICASONE PROPIONATE HFA 44 MCG/ACT IN AERO
2.0000 | INHALATION_SPRAY | Freq: Two times a day (BID) | RESPIRATORY_TRACT | Status: DC
  Administered 2019-04-23 – 2019-05-02 (×18): 2 via RESPIRATORY_TRACT
  Filled 2019-04-23: qty 10.6

## 2019-04-23 NOTE — ED Notes (Signed)
[facility] at facility for transport 

## 2019-04-23 NOTE — Progress Notes (Signed)
Patient arrived to unit via [facility].  Supplemental oxygen 3LNC. No acute distress noted. Patient oriented to unit, staff and plan of care. Verbalized understanding. Patient has 1silver colored ring and 2 rings yellow in color one with 3 clear stones. Sliver in color necklace Cell phone and charger at bedside. Dr. Loralee Pacas paged to notify of arrival to unit.

## 2019-04-23 NOTE — Progress Notes (Signed)
Brittany [name]  [id number] DOB: 26-Jun-1955 DOA: 04/21/2019 PCP: Caren Macadam, MD    Brief Narrative:  63 year old with a history of asthma, depression, bipolar disorder, PTSD, GERD, and anxiety who presented to the [facility] ED with progressive shortness of breath of 1 weeks duration.  In the ED she was found to have an oxygen saturation of 88% on room air temperature of 101 chest x-ray noted ill-defined areas of airspace opacity throughout the left lung.  Her Covid test ultimately returned positive.  Her ROS was incidentally positive for black stools for multiple days prior to admission.  Significant Events: 11/4 admit to [facility] via [facility] ED  COVID-19 specific Treatment: Remdesivir 11/4 > Decadron 11/4 >  Subjective: The patient is sitting up in bed.  She is alert and oriented.  She reports some mild shortness of breath.  She denies chest pain nausea vomiting or abdominal pain.  She does report frequent hectic coughing.  Assessment & Plan:  Covid pneumonia - acute hypoxic respiratory failure Appears to be stabilizing at this time with low level oxygen support -continue remdesivir and Decadron -no indication for other therapies at present Recent Labs  Lab 04/21/19 1721 04/22/19 0248 04/23/19 0643  DDIMER  --  0.99* 1.63*  FERRITIN  --  208  --   CRP  --  15.1*  --   ALT 19  --  31  PROCALCITON  --  <0.10  --     Melena Last EGD October 2019 without acute findings -history suggest significant ibuprofen use -stool guaiac negative at time of presentation -further history suggest this may have been related to Pepto-Bismol use -follow-up CBC in a.m.  Asthma Quiescent presently  Bipolar disorder Continue usual home therapies -appears well managed at present  Suprapubic abdominal pain UA unremarkable -appears to be resolved at this time  QT prolongation on EKG Exercise caution in medication selection  DVT prophylaxis: Lovenox Code Status: FULL CODE Family  Communication:  Disposition Plan: Telemetry bed  Consultants:  none  Antimicrobials:  None  Objective: Blood pressure (!) 125/57, pulse 81, temperature 98.1 F (36.7 C), temperature source Oral, resp. rate 20, height 5\' 1"  (1.549 m), weight 78.9 kg, SpO2 93 %.  Intake/Output Summary (Last 24 hours) at 04/23/2019 1437 Last data filed at 04/23/2019 1300 Gross per 24 hour  Intake 850 ml  Output -  Net 850 ml   Filed Weights   04/21/19 1708  Weight: 78.9 kg    Examination: General: No acute respiratory distress Lungs: Fine bibasilar crackles with good air movement throughout with no wheezing Cardiovascular: Regular rate and rhythm without murmur gallop or rub normal S1 and S2 Abdomen: Nontender, nondistended, soft, bowel sounds positive, no rebound, no ascites, no appreciable mass Extremities: No significant cyanosis, clubbing, or edema bilateral lower extremities  CBC: Recent Labs  Lab 04/21/19 1721 04/23/19 0643  WBC 3.4* 5.6  NEUTROABS  --  4.7  HGB 13.3 14.0  HCT 41.0 43.7  MCV 91.3 92.4  PLT 261 [id number]   Basic Metabolic Panel: Recent Labs  Lab 04/21/19 1721 04/23/19 0643  NA 139 138  K 3.8 4.2  CL 103 104  CO2 22 25  GLUCOSE 110* 96  BUN 5* 11  CREATININE 0.80 0.59  CALCIUM 8.5* 8.8*  MG  --  2.1   GFR: Estimated Creatinine Clearance: 68.4 mL/min (by C-G formula based on SCr of 0.59 mg/dL).  Liver Function Tests: Recent Labs  Lab 04/21/19 1721 04/23/19 0643  AST  13* 43*  ALT 19 31  ALKPHOS 58 74  BILITOT 0.2* 0.5  PROT 6.2* 7.3  ALBUMIN 3.0* 3.4*    HbA1C: Hgb A1c MFr Bld  Date/Time Value Ref Range Status  11/28/2018 09:30 AM 4.9 4.6 - 6.5 % Final    Comment:    Glycemic Control Guidelines for People with Diabetes:Non Diabetic:  <6%Goal of Therapy: <7%Additional Action Suggested:  >8%   01/27/2018 11:48 AM 4.9 4.6 - 6.5 % Final    Comment:    Glycemic Control Guidelines for People with Diabetes:Non Diabetic:  <6%Goal of Therapy:  <7%Additional Action Suggested:  >8%     Recent Results (from the past 240 hour(s))  SARS CORONAVIRUS 2 (TAT 6-24 HRS) Nasopharyngeal Nasopharyngeal Swab     Status: Abnormal   Collection Time: 04/22/19  2:46 AM   Specimen: Nasopharyngeal Swab  Result Value Ref Range Status   SARS Coronavirus 2 POSITIVE (A) NEGATIVE Final    Comment: RESULT CALLED TO, READ BACK BY AND VERIFIED WITH: Precious Reel RN 14:30 04/22/19 ([email or web address]) (NOTE) SARS-CoV-2 target nucleic acids are DETECTED. The SARS-CoV-2 RNA is generally detectable in upper and lower respiratory specimens during the acute phase of infection. Positive results are indicative of active infection with SARS-CoV-2. Clinical  correlation with patient history and other diagnostic information is necessary to determine patient infection status. Positive results do  not rule out bacterial infection or co-infection with other viruses. The expected result is Negative. Fact Sheet for Patients: SugarRoll.be Fact Sheet for Healthcare Providers: https://www.woods-mathews.com/ This test is not yet approved or cleared by the [place] FDA and  has been authorized for detection and/or diagnosis of SARS-CoV-2 by FDA under an Emergency Use Authorization (EUA). This EUA will remain  in effect (meaning this test can be used) for  the duration of the COVID-19 declaration under Section 564(b)(1) of the Act, 21 U.S.C. section 360bbb-3(b)(1), unless the authorization is terminated or revoked sooner. Performed at [facility], [place] [address] [place], [place] [address]   Blood Culture (routine x 2)     Status: None (Preliminary result)   Collection Time: 04/22/19  3:30 AM   Specimen: BLOOD RIGHT HAND  Result Value Ref Range Status   Specimen Description BLOOD RIGHT HAND  Final   Special Requests   Final    BOTTLES DRAWN AEROBIC AND ANAEROBIC Blood Culture adequate volume   Culture   Final    NO GROWTH 1  DAY Performed at [facility], [place] [address], [place], [place] [address]    Report Status PENDING  Incomplete  Blood Culture (routine x 2)     Status: None (Preliminary result)   Collection Time: 04/22/19  3:30 AM   Specimen: BLOOD  Result Value Ref Range Status   Specimen Description BLOOD RIGHT ANTECUBITAL  Final   Special Requests   Final    BOTTLES DRAWN AEROBIC AND ANAEROBIC Blood Culture adequate volume   Culture   Final    NO GROWTH 1 DAY Performed at [facility], [place] [address] [place], [place] [address]    Report Status PENDING  Incomplete     Scheduled Meds: . benzonatate  200 mg Oral TID  . busPIRone  15 mg Oral TID  . colestipol  1 g Oral BID  . dexamethasone (DECADRON) injection  6 mg Intravenous Q24H  . enoxaparin (LOVENOX) injection  40 mg Subcutaneous Q24H  . famotidine  20 mg Oral BID  . mirtazapine  15 mg  Oral QHS  . vitamin C  500 mg Oral Daily  . zinc sulfate  220 mg Oral Daily   Continuous Infusions: . remdesivir 100 mg in NS 250 mL       LOS: 1 day   Cherene Altes, MD [facility] Office  [phone] Pager - Text Page per Amion  If 7PM-7AM, please contact night-coverage per Amion 04/23/2019, 2:37 PM

## 2019-04-23 NOTE — Progress Notes (Signed)
New admission on arrival first set of vitals patient [place] Yellow  RR 30, Not a acute change, patient baseline.  Red - At High Risk for Deterioration Yellow - At risk for Deterioration  1. Go to room and assess patient 2. Validate data. Is this patient's baseline? If data confirmed: 3. Is this an acute change? 4. Administer prn meds/treatments as ordered. 5. Note Sepsis score 6. Review goals of care 7. Sports coach, RRT nurse and Provider. 8. Ask Provider to come to bedside.  9. Document patient condition/interventions/response. 10. Increase frequency of vital signs and focused assessments to at least q15 minutes x 4, then q30 minutes x2. - If stable, then q1h x3, then q4h x3 and then q8h or dept. routine. - If unstable, contact Provider & RRT nurse. Prepare for possible transfer. 11. Add entry in progress notes using the smart phrase ".MEWS". 1. Go to room and assess patient 2. Validate data. Is this patient's baseline? If data confirmed: 3. Is this an acute change? 4. Administer prn meds/treatments as ordered? 5. Note Sepsis score 6. Review goals of care 7. Sports coach and Provider 8. Call RRT nurse as needed. 9. Document patient condition/interventions/response. 10. Increase frequency of vital signs and focused assessments to at least q2h x2. - If stable, then q4h x2 and then q8h or dept. routine. - If unstable, contact Provider & RRT nurse. Prepare for possible transfer. 11. Add entry in progress notes using the smart phrase ".MEWS".  Green - Likely stable Lavender - Comfort Care Only  1. Continue routine/ordered monitoring.  2. Review goals of care. 1. Continue routine/ordered monitoring. 2. Review goals of care.

## 2019-04-24 DIAGNOSIS — F419 Anxiety disorder, unspecified: Secondary | ICD-10-CM

## 2019-04-24 LAB — CBC WITH DIFFERENTIAL/PLATELET
Abs Immature Granulocytes: 0.03 10*3/uL (ref 0.00–0.07)
Basophils Absolute: 0 10*3/uL (ref 0.0–0.1)
Basophils Relative: 0 %
Eosinophils Absolute: 0 10*3/uL (ref 0.0–0.5)
Eosinophils Relative: 0 %
HCT: 42.5 % (ref 36.0–46.0)
Hemoglobin: 13.5 g/dL (ref 12.0–15.0)
Immature Granulocytes: 0 %
Lymphocytes Relative: 8 %
Lymphs Abs: 0.6 10*3/uL — ABNORMAL LOW (ref 0.7–4.0)
MCH: 29.3 pg (ref 26.0–34.0)
MCHC: 31.8 g/dL (ref 30.0–36.0)
MCV: 92.2 fL (ref 80.0–100.0)
Monocytes Absolute: 0.5 10*3/uL (ref 0.1–1.0)
Monocytes Relative: 7 %
Neutro Abs: 6.2 10*3/uL (ref 1.7–7.7)
Neutrophils Relative %: 85 %
Platelets: 355 10*3/uL (ref 150–400)
RBC: 4.61 MIL/uL (ref 3.87–5.11)
RDW: 13.7 % (ref 11.5–15.5)
WBC: 7.3 10*3/uL (ref 4.0–10.5)
nRBC: 0 % (ref 0.0–0.2)

## 2019-04-24 LAB — COMPREHENSIVE METABOLIC PANEL
ALT: 78 U/L — ABNORMAL HIGH (ref 0–44)
AST: 81 U/L — ABNORMAL HIGH (ref 15–41)
Albumin: 3.2 g/dL — ABNORMAL LOW (ref 3.5–5.0)
Alkaline Phosphatase: 68 U/L (ref 38–126)
Anion gap: 12 (ref 5–15)
BUN: 13 mg/dL (ref 8–23)
CO2: 25 mmol/L (ref 22–32)
Calcium: 9 mg/dL (ref 8.9–10.3)
Chloride: 105 mmol/L (ref 98–111)
Creatinine, Ser: 0.55 mg/dL (ref 0.44–1.00)
GFR calc Af Amer: >60 mL/min (ref >60)
GFR calc non Af Amer: >60 mL/min (ref >60)
Glucose, Bld: 101 mg/dL — ABNORMAL HIGH (ref 70–99)
Potassium: 3.8 mmol/L (ref 3.5–5.1)
Sodium: 142 mmol/L (ref 135–145)
Total Bilirubin: 0.6 mg/dL (ref 0.3–1.2)
Total Protein: 6.6 g/dL (ref 6.5–8.1)

## 2019-04-24 LAB — TYPE AND SCREEN
ABO/RH(D): O POS
Antibody Screen: NEGATIVE

## 2019-04-24 LAB — FERRITIN: Ferritin: 443 ng/mL — ABNORMAL HIGH (ref 11–307)

## 2019-04-24 LAB — D-DIMER, QUANTITATIVE: D-Dimer, Quant: 1.46 ug/mL-FEU — ABNORMAL HIGH (ref 0.00–0.50)

## 2019-04-24 LAB — C-REACTIVE PROTEIN: CRP: 3.6 mg/dL — ABNORMAL HIGH (ref <1.0)

## 2019-04-24 MED ORDER — DIPHENHYDRAMINE HCL 50 MG/ML IJ SOLN
25.0000 mg | Freq: Once | INTRAMUSCULAR | Status: AC
  Administered 2019-04-24: 25 mg via INTRAVENOUS
  Filled 2019-04-24: qty 1

## 2019-04-24 MED ORDER — ACETAMINOPHEN 325 MG PO TABS
650.0000 mg | ORAL_TABLET | Freq: Once | ORAL | Status: AC
  Administered 2019-04-24: 650 mg via ORAL
  Filled 2019-04-24: qty 2

## 2019-04-24 MED ORDER — FUROSEMIDE 10 MG/ML IJ SOLN
40.0000 mg | Freq: Once | INTRAMUSCULAR | Status: AC
  Administered 2019-04-24: 40 mg via INTRAVENOUS
  Filled 2019-04-24: qty 4

## 2019-04-24 MED ORDER — SODIUM CHLORIDE 0.9% IV SOLUTION
Freq: Once | INTRAVENOUS | Status: AC
  Administered 2019-04-24: 10 mL/h via INTRAVENOUS

## 2019-04-24 NOTE — TOC Initial Note (Signed)
Transition of Care [facility]) - Initial/Assessment Note    Patient Details  Name: Brittany [name] MRN: [id number] Date of Birth: 1955-09-03  Transition of Care [facility]) CM/SW Contact:    Ninfa Meeker, RN Phone Number: 04/24/2019, 12:59 PM    Clinical Narrative: Patient is a  63 year old with a history of asthma, depression, bipolar disorder, PTSD, GERD, and anxiety who presented to the [facility] ED 04/21/19 with progressive shortness of breath of 1 weeks duration.In ED,  hypoxia od 88% on room air, fever, chest x-ray noted ill-defined areas of airspace opacity throughout the left lung. Admitted and transferred to [facility] for further treatment for COVID 19. Patient is on 4L [place], plan is to wean as tolerated. On Remdesivir and IV decadron. Case manager will continue to monitor.          Patient Goals and CMS Choice        Expected Discharge Plan and Services                                                Prior Living Arrangements/Services                       Activities of Daily Living Home Assistive Devices/Equipment: None ADL Screening (condition at time of admission) Patient's cognitive ability adequate to safely complete daily activities?: Yes Is the patient deaf or have difficulty hearing?: No Does the patient have difficulty seeing, even when wearing glasses/contacts?: No Does the patient have difficulty concentrating, remembering, or making decisions?: No Patient able to express need for assistance with ADLs?: No Does the patient have difficulty dressing or bathing?: No Independently performs ADLs?: Yes (appropriate for developmental age) Does the patient have difficulty walking or climbing stairs?: No Weakness of Legs: None Weakness of Arms/Hands: None  Permission Sought/Granted                  Emotional Assessment              Admission diagnosis:  SOB (shortness of breath) [R06.02] Exposure to COVID-19 virus [Z20.828] Pneumonia due  to COVID-19 virus [U07.1, J12.89] Patient Active Problem List   Diagnosis Date Noted  . Suspected COVID-19 virus infection 04/22/2019  . Melena 04/22/2019  . Suprapubic abdominal pain 04/22/2019  . Encounter for screening for HIV 04/22/2019  . Pneumonia due to COVID-19 virus 04/22/2019  . Osteopenia 12/04/2016  . Chronic post-traumatic stress disorder (PTSD) 08/23/2016  . Bipolar affective disorder, current episode depressed ([place]) 08/23/2016  . Hyperlipidemia 08/23/2016  . COPD / AB vs ACOS 06/22/2016  . Cough variant asthma  vs UACS  06/21/2016  . GERD 04/03/2007   PCP:  Caren Macadam, MD Pharmacy:   [facility], [place] [address] Phone: [phone] Fax: [phone]  [facility] [place], [place] [facility] [address] [place] [address] Phone: [phone] Fax: [phone]     Social Determinants of Health (SDOH) Interventions    Readmission Risk Interventions No flowsheet data found.

## 2019-04-24 NOTE — Progress Notes (Signed)
PROGRESS NOTE                                                                                                                                                                                                             Patient Demographics:    Brittany [name], is a 63 y.o. female, DOB - 06-Jul-1955, [id number]  Outpatient Primary MD for the patient is Caren Macadam, MD   Admit date - 04/21/2019   LOS - 2  Chief Complaint  Patient presents with   GI Bleeding   Cough       Brief Narrative: Patient is a 63 y.o. female with PMHx of depression, bipolar disorder, PTSD, GERD, anxiety, asthma who presented with shortness of breath x1 week-she was found to have acute hypoxic respiratory failure secondary to COVID-19 pneumonia.  Her review of systems was instantly positive for melena-without significant anemia.  See below for further details.   Subjective:    Brittany [name] today is on 4 L of oxygen-she feels essentially the same as yesterday.   Assessment  & Plan :   Acute Hypoxic Resp Failure due to Covid 19 Viral pneumonia: On 4 L of oxygen-CRP downtrending-plans are to continue with steroids and remdesivir.  Although on 4 L of oxygen she appears very comfortable.    After extensive discussion-rationale, risks, benefits-she consents to the use of convalescent plasma.  We will order.  Fever: afebrile  O2 requirements: On 4 l/m  COVID-19 Labs: Recent Labs    04/22/19 0248 04/23/19 0643 04/24/19 0549  DDIMER 0.99* 1.63* 1.46*  FERRITIN 208 398* 443*  LDH 321*  --   --   CRP 15.1* 11.4* 3.6*    Lab Results  Component Value Date   SARSCOV2NAA POSITIVE (A) 04/22/2019     COVID-19 Medications: Steroids: 11/4>> Remdesivir: 11/4>> Actemra: Hold off for now Convalescent Plasma: X1 on 11/6  research Studies:N/A  Other medications: Diuretics:Euvolemic-but will give 1 dose of IV Lasix to maintain negative  balance.  Antibiotics:Not needed as no evidence of bacterial infection  Prone/Incentive Spirometry: encouraged  incentive spirometry use 3-4/hour.  DVT Prophylaxis  :  Lovenox   ?  Melena: Incidentally found to have melena at the time of review of systems-last EGD October 2019 without acute findings-FOBT stools negative.  Hemoglobin stable-avoid NSAIDs-continue supportive care  Bronchial asthma: Stable-continue bronchodilators  Bipolar disorder: Stable  Anxiety: Continue BuSpar and as needed Valium.  Obesity: Estimated body mass index is 32.88 kg/m as calculated from the following:   Height as of this encounter: 5\' 1"  (1.549 m).   Weight as of this encounter: 78.9 kg.   ABG:    Component Value Date/Time   TCO2 26 04/07/2009 1318    Vent Settings: N/A  Condition - Stable  Family Communication  :  Daughter updated over the phone 11/6  Code Status :  Full Code  Diet :  Diet Order            Diet regular Room service appropriate? Yes; Fluid consistency: Thin  Diet effective now               Disposition Plan  :  Remain hospitalized  Barriers to discharge: Hypoxia requiring O2 supplementation/complete 5 days of IV Remdesivir  Consults  :  None  Procedures  :  None  GI prophylaxis: H2 Blocker  Antibiotics  :    Anti-infectives (From admission, onward)   Start     Dose/Rate Route Frequency Ordered Stop   04/23/19 1600  remdesivir 100 mg in sodium chloride 0.9 % 250 mL IVPB     100 mg 500 mL/hr over 30 Minutes Intravenous Every 24 hours 04/22/19 1727 04/27/19 1559   04/22/19 1830  remdesivir 200 mg in sodium chloride 0.9 % 250 mL IVPB     200 mg 500 mL/hr over 30 Minutes Intravenous Once 04/22/19 1727 04/22/19 2200      Inpatient Medications  Scheduled Meds:  benzonatate  200 mg Oral TID   busPIRone  15 mg Oral TID   colestipol  1 g Oral BID   dexamethasone (DECADRON) injection  6 mg Intravenous Q24H   doxazosin  6 mg Oral QHS   enoxaparin  (LOVENOX) injection  40 mg Subcutaneous Q24H   famotidine  20 mg Oral BID   fluticasone  2 spray Each Nare Daily   fluticasone  2 puff Inhalation BID   mirtazapine  15 mg Oral QHS   montelukast  10 mg Oral QHS   vitamin C  500 mg Oral Daily   zinc sulfate  220 mg Oral Daily   Continuous Infusions:  remdesivir 100 mg in NS 250 mL Stopped (04/24/19 0156)   PRN Meds:.acetaminophen, albuterol, diazepam, guaiFENesin-dextromethorphan, prochlorperazine   Time Spent in minutes  25  See all Orders from today for further details   Oren Binet M.D on 04/24/2019 at 8:32 AM  To page go to [email or web address] - use universal password  [facility] -  Office  [phone]    Objective:   Vitals:   04/23/19 1200 04/23/19 2000 04/24/19 0000 04/24/19 0759  BP:  106/72 124/71 (!) 110/46  Pulse:  72 71 82  Resp:  (!) 25 (!) 24 (!) 22  Temp:  98.3 F (36.8 C) 98.1 F (36.7 C) 97.9 F (36.6 C)  TempSrc:  Oral Oral Oral  SpO2: 93% 92% 91% (!) 89%  Weight:      Height:        Wt Readings from Last 3 Encounters:  04/21/19 78.9 kg  01/22/19 80.7 kg  11/28/18 84.9 kg     Intake/Output Summary (Last 24 hours) at 04/24/2019 [id number] Last data filed at 04/24/2019 0156 Gross per 24 hour  Intake 970 ml  Output 400 ml  Net 570 ml     Physical Exam Gen Exam:Alert awake-not in any distress HEENT:atraumatic, normocephalic Chest: B/L  clear to auscultation anteriorly CVS:S1S2 regular Abdomen:soft non tender, non distended Extremities:no edema Neurology: Non focal Skin: no rash   Data Review:    CBC Recent Labs  Lab 04/21/19 1721 04/23/19 0643 04/24/19 0549  WBC 3.4* 5.6 7.3  HGB 13.3 14.0 13.5  HCT 41.0 43.7 42.5  PLT 261 339 355  MCV 91.3 92.4 92.2  MCH 29.6 29.6 29.3  MCHC 32.4 32.0 31.8  RDW 13.4 13.9 13.7  LYMPHSABS  --  0.5* 0.6*  MONOABS  --  0.4 0.5  EOSABS  --  0.0 0.0  BASOSABS  --  0.0 0.0    Chemistries  Recent Labs  Lab 04/21/19 1721  04/23/19 0643 04/24/19 0549  NA 139 138 142  K 3.8 4.2 3.8  CL 103 104 105  CO2 22 25 25   GLUCOSE 110* 96 101*  BUN 5* 11 13  CREATININE 0.80 0.59 0.55  CALCIUM 8.5* 8.8* 9.0  MG  --  2.1  --   AST 13* 43* 81*  ALT 19 31 78*  ALKPHOS 58 74 68  BILITOT 0.2* 0.5 0.6   ------------------------------------------------------------------------------------------------------------------ Recent Labs    04/22/19 0248  TRIG 40    Lab Results  Component Value Date   HGBA1C 4.9 11/28/2018   ------------------------------------------------------------------------------------------------------------------ No results for input(s): TSH, T4TOTAL, T3FREE, THYROIDAB in the last 72 hours.  Invalid input(s): FREET3 ------------------------------------------------------------------------------------------------------------------ Recent Labs    04/23/19 0643 04/24/19 0549  FERRITIN 398* 443*    Coagulation profile No results for input(s): INR, PROTIME in the last 168 hours.  Recent Labs    04/23/19 0643 04/24/19 0549  DDIMER 1.63* 1.46*    Cardiac Enzymes No results for input(s): CKMB, TROPONINI, MYOGLOBIN in the last 168 hours.  Invalid input(s): CK ------------------------------------------------------------------------------------------------------------------ No results found for: BNP  Micro Results Recent Results (from the past 240 hour(s))  SARS CORONAVIRUS 2 (TAT 6-24 HRS) Nasopharyngeal Nasopharyngeal Swab     Status: Abnormal   Collection Time: 04/22/19  2:46 AM   Specimen: Nasopharyngeal Swab  Result Value Ref Range Status   SARS Coronavirus 2 POSITIVE (A) NEGATIVE Final    Comment: RESULT CALLED TO, READ BACK BY AND VERIFIED WITH: Precious Reel RN 14:30 04/22/19 ([email or web address]) (NOTE) SARS-CoV-2 target nucleic acids are DETECTED. The SARS-CoV-2 RNA is generally detectable in upper and lower respiratory specimens during the acute phase of infection. Positive results are  indicative of active infection with SARS-CoV-2. Clinical  correlation with patient history and other diagnostic information is necessary to determine patient infection status. Positive results do  not rule out bacterial infection or co-infection with other viruses. The expected result is Negative. Fact Sheet for Patients: SugarRoll.be Fact Sheet for Healthcare Providers: https://www.woods-mathews.com/ This test is not yet approved or cleared by the [place] FDA and  has been authorized for detection and/or diagnosis of SARS-CoV-2 by FDA under an Emergency Use Authorization (EUA). This EUA will remain  in effect (meaning this test can be used) for  the duration of the COVID-19 declaration under Section 564(b)(1) of the Act, 21 U.S.C. section 360bbb-3(b)(1), unless the authorization is terminated or revoked sooner. Performed at [facility], [place] [address] [place], [place] [address]   Blood Culture (routine x 2)     Status: None (Preliminary result)   Collection Time: 04/22/19  3:30 AM   Specimen: BLOOD RIGHT HAND  Result Value Ref Range Status   Specimen Description BLOOD RIGHT HAND  Final   Special Requests   Final  BOTTLES DRAWN AEROBIC AND ANAEROBIC Blood Culture adequate volume   Culture   Final    NO GROWTH 1 DAY Performed at [facility], [place] [address] [place], [place] [address]    Report Status PENDING  Incomplete  Blood Culture (routine x 2)     Status: None (Preliminary result)   Collection Time: 04/22/19  3:30 AM   Specimen: BLOOD  Result Value Ref Range Status   Specimen Description BLOOD RIGHT ANTECUBITAL  Final   Special Requests   Final    BOTTLES DRAWN AEROBIC AND ANAEROBIC Blood Culture adequate volume   Culture   Final    NO GROWTH 1 DAY Performed at [facility], [place] [address] [place], [place] [address]    Report Status PENDING  Incomplete    Radiology Reports Ct Angio Chest Pe W Or  Wo Contrast  Result Date: 04/22/2019 CLINICAL DATA:  63 year old female with a history of shortness of breath and abnormal chest x-ray EXAM: CT ANGIOGRAPHY CHEST WITH CONTRAST TECHNIQUE: Multidetector CT imaging of the chest was performed using the standard protocol during bolus administration of intravenous contrast. Multiplanar CT image reconstructions and MIPs were obtained to evaluate the vascular anatomy. CONTRAST:  12mL OMNIPAQUE IOHEXOL 350 MG/ML SOLN COMPARISON:  04/10/2009, chest x-ray 04/21/2019, 06/21/2016 FINDINGS: Cardiovascular: Heart: No cardiomegaly. No pericardial fluid/thickening. Calcifications of the left anterior descending coronary artery. Aorta: Unremarkable course, caliber, contour of the thoracic aorta. No aneurysm or dissection flap. No periaortic fluid. Pulmonary arteries: No filling defects of the main pulmonary artery, lobar arteries, segmental or proximal subsegmental arteries. Mediastinum/Nodes: Small lymph nodes of the mediastinum. Unremarkable appearance of the thoracic esophagus. Unremarkable thoracic inlet. Lungs/Pleura: Pattern of peripheral ground-glass and linear opacities of the bilateral lungs, more prominent on the left and predominantly in a peripheral distribution/subpleural distribution. There is a gradient towards the upper lungs. No pleural effusion or pneumothorax. Linear changes in the bilateral lung bases. No endotracheal or endobronchial debris. Upper Abdomen: No acute. Musculoskeletal: No acute displaced fracture. Degenerative changes of the spine. Review of the MIP images confirms the above findings. IMPRESSION: CT is negative for pulmonary emboli. There are a spectrum of findings in the lungs which are compatible with acute/subacute atypical infection (as well as other non-infectious etiologies), such as COVID-19 infection. Coronary artery disease. Electronically Signed   By: Corrie Mckusick D.O.   On: 04/22/2019 20:55   Dg Chest Port 1 View  Result Date:  04/21/2019 CLINICAL DATA:  Shortness of breath and cough. Former smoker. EXAM: PORTABLE CHEST 1 VIEW COMPARISON:  06/21/2016 FINDINGS: Heart size is at the upper limits of normal. Asymmetric ill-defined areas of airspace opacity are seen in the left lung which are new, and suspicious for viral or other atypical pneumonia. No evidence of pleural effusion. IMPRESSION: New ill-defined areas of airspace opacity in left lung, suspicious for viral or other atypical pneumonia. Electronically Signed   By: Marlaine Hind M.D.   On: 04/21/2019 20:07

## 2019-04-24 NOTE — Evaluation (Signed)
Occupational Therapy Evaluation Patient Details Name: Brittany [name] MRN: [id number] DOB: [birth year] Today's Date: 04/24/2019    History of Present Illness 63 year old with a history of asthma, depression, bipolar disorder, PTSD, GERD, and anxiety who presented to the [facility] ED 04/21/19 with progressive shortness of breath of 1 weeks duration.In ED,  hypoxia od 88% on room air, fever, chest x-ray noted ill-defined areas of airspace opacity throughout the left lung. Positive for COVID-19.  Her Covid   Clinical Impression   This 63 y/o female presents with the above. PTA pt reports independence with ADL, iADL and functional mobility. Pt currently limited due to nausea, decreased activity tolerance, and general weakness. Pt requiring minguard assist for room level mobility without AD (pt often using UE support on items in room). Pt completing toileting, LB and seated UB ADL at minguard assist level, taking rest breaks PRN. Pt on 4L O2 during session with SpO2 88% and greater during activity. Pt will benefit from continued acute OT services to maximize her safety and independence with ADL and mobility prior to return home. Will follow.     Follow Up Recommendations  No OT follow up;Supervision/Assistance - 24 hour(24hr initially)    Equipment Recommendations  3 in 1 bedside commode(vs shower seat)           Precautions / Restrictions Precautions Precautions: Other (comment) Precaution Comments: monitor sats, wean down  O2 Restrictions Weight Bearing Restrictions: No      Mobility Bed Mobility Overal bed mobility: Modified Independent                Transfers Overall transfer level: Modified independent                    Balance                                           ADL either performed or assessed with clinical judgement   ADL Overall ADL's : Needs assistance/impaired Eating/Feeding: Independent   Grooming: Wash/dry face;Wash/dry  hands;Set up;Sitting   Upper Body Bathing: Set up;Supervision/ safety;Sitting   Lower Body Bathing: Min guard;Sit to/from stand Lower Body Bathing Details (indicate cue type and reason): minguard for balance in standing, pt required x1 seated rest break during LB portion of ADL Upper Body Dressing : Set up;Sitting   Lower Body Dressing: Min guard;Sit to/from stand   Toilet Transfer: Min guard;Ambulation;BSC Toilet Transfer Details (indicate cue type and reason): BSC in room, pt ambulating around EOB to [facility] with minguard for safety Toileting- Clothing Manipulation and Hygiene: Min guard;Sit to/from stand       Functional mobility during ADLs: Min guard General ADL Comments: pt with nausea this session, decreased activity tolerance; requires rest breaks but initiating them PRN without cues to do so      Vision         Perception     Praxis      Pertinent Vitals/Pain Pain Assessment: No/denies pain(pt nauseous)     Hand Dominance Right   Extremity/Trunk Assessment Upper Extremity Assessment Upper Extremity Assessment: Generalized weakness   Lower Extremity Assessment Lower Extremity Assessment: Defer to PT evaluation   Cervical / Trunk Assessment Cervical / Trunk Assessment: Normal   Communication Communication Communication: No difficulties   Cognition Arousal/Alertness: Awake/alert Behavior During Therapy: WFL for tasks assessed/performed Overall Cognitive Status: Within Functional Limits for tasks assessed  General Comments       Exercises     Shoulder Instructions      Home Living Family/patient expects to be discharged to:: Private residence Living Arrangements: Alone Available Help at Discharge: Family;Available PRN/intermittently Type of Home: House Home Access: Level entry     Home Layout: One level     Bathroom Shower/Tub: Tub/shower unit;Curtain   Biochemist, clinical: Standard     Home  Equipment: None   Additional Comments: has a stool she has been using as a shower chair      Prior Functioning/Environment Level of Independence: Independent                 OT Problem List: Decreased strength;Decreased range of motion;Decreased activity tolerance;Impaired balance (sitting and/or standing);Decreased knowledge of use of DME or AE;Obesity;Cardiopulmonary status limiting activity      OT Treatment/Interventions: Self-care/ADL training;Neuromuscular education;Energy conservation;DME and/or AE instruction;Therapeutic activities;Balance training;Patient/family education    OT Goals(Current goals can be found in the care plan section) Acute Rehab OT Goals Patient Stated Goal: to get home to her cats  OT Goal Formulation: With patient Time For Goal Achievement: 05/08/19 Potential to Achieve Goals: Good  OT Frequency: Min 2X/week   Barriers to D/C:            Co-evaluation              AM-PAC OT "6 Clicks" Daily Activity     Outcome Measure Help from another person eating meals?: None Help from another person taking care of personal grooming?: None Help from another person toileting, which includes using toliet, bedpan, or urinal?: None Help from another person bathing (including washing, rinsing, drying)?: A Little Help from another person to put on and taking off regular upper body clothing?: None Help from another person to put on and taking off regular lower body clothing?: A Little 6 Click Score: 22   End of Session Equipment Utilized During Treatment: Oxygen Nurse Communication: Mobility status  Activity Tolerance: Patient tolerated treatment well;Other (comment)(limited due to nausea) Patient left: in bed;with call bell/phone within reach  OT Visit Diagnosis: Muscle weakness (generalized) (M62.81);Other (comment)(decreased activity tolerance)                Time: 1430-1507 OT Time Calculation (min): 37 min Charges:  OT General Charges $OT  Visit: 1 Visit OT Evaluation $OT Eval Moderate Complexity: 1 Mod OT Treatments $Self Care/Home Management : 8-22 mins  Lou Cal, OT Supplemental Rehabilitation Services Pager [phone] Office [phone]   Raymondo Band 04/24/2019, 4:23 PM

## 2019-04-24 NOTE — Evaluation (Signed)
Physical Therapy Evaluation Patient Details Name: Brittany [name] MRN: [id number] DOB: [birth year] Today's Date: 04/24/2019   History of Present Illness  63 year old with a history of asthma, depression, bipolar disorder, PTSD, GERD, and anxiety who presented to the [facility] ED 04/21/19 with progressive shortness of breath of 1 weeks duration.In ED,  hypoxia od 88% on room air, fever, chest x-ray noted ill-defined areas of airspace opacity throughout the left lung. Positive for COVID-19.  Her Covid  Clinical Impression  The patient ambulated on 4 L x 40' with SPO2 down to 88%, HR 104, RR 33 with activity. Patient also dyspneic wgen talking. Instructed in pursed lip breaths, flutter valve, has been using IS. Continue PT for progressive ambulation and SPO2 monitoring/weaniing oxygen as tolerated. Pt admitted with above diagnosis.  Pt currently with functional limitations due to the deficits listed below (see PT Problem List). Pt will benefit from skilled PT to increase their independence and safety with mobility to allow discharge to the venue listed below.        No PT follow up    Equipment Recommendations  None recommended by PT    Recommendations for Other Services       Precautions / Restrictions Precautions Precaution Comments: monitor sats, wean down  O2      Mobility  Bed Mobility Overal bed mobility: Independent                Transfers Overall transfer level: Modified independent               General transfer comment: supports on bed rails and BSC  Ambulation/Gait Ambulation/Gait assistance: Min guard Gait Distance (Feet): 40 Feet Assistive device: (supported on foot board) Gait Pattern/deviations: Step-through pattern     General Gait Details: slow pace  Stairs            Wheelchair Mobility    Modified Rankin (Stroke Patients Only)       Balance                                             Pertinent Vitals/Pain  Pain Assessment: No/denies pain    Home Living Family/patient expects to be discharged to:: Private residence Living Arrangements: Alone Available Help at Discharge: Family;Available PRN/intermittently Type of Home: House Home Access: Level entry     Home Layout: One level Home Equipment: None      Prior Function Level of Independence: Independent               Hand Dominance   Dominant Hand: Right    Extremity/Trunk Assessment   Upper Extremity Assessment Upper Extremity Assessment: Defer to OT evaluation    Lower Extremity Assessment Lower Extremity Assessment: Generalized weakness    Cervical / Trunk Assessment Cervical / Trunk Assessment: Normal  Communication   Communication: No difficulties  Cognition Arousal/Alertness: Awake/alert Behavior During Therapy: WFL for tasks assessed/performed Overall Cognitive Status: Within Functional Limits for tasks assessed                                        General Comments      Exercises     Assessment/Plan    PT Assessment Patient needs continued PT services  PT Problem List Decreased strength;Decreased mobility;Decreased activity tolerance;Cardiopulmonary status limiting activity;Decreased  knowledge of precautions       PT Treatment Interventions Gait training;Functional mobility training;Therapeutic exercise;Therapeutic activities;Patient/family education    PT Goals (Current goals can be found in the Care Plan section)  Acute Rehab PT Goals Patient Stated Goal: to get over this, PT Goal Formulation: With patient Time For Goal Achievement: 05/08/19 Potential to Achieve Goals: Good    Frequency Min 3X/week   Barriers to discharge        Co-evaluation               AM-PAC PT "6 Clicks" Mobility  Outcome Measure Help needed turning from your back to your side while in a flat bed without using bedrails?: None Help needed moving from lying on your back to sitting on the  side of a flat bed without using bedrails?: None Help needed moving to and from a bed to a chair (including a wheelchair)?: None Help needed standing up from a chair using your arms (e.g., wheelchair or bedside chair)?: None Help needed to walk in hospital room?: A Little Help needed climbing 3-5 steps with a railing? : A Lot 6 Click Score: 21    End of Session Equipment Utilized During Treatment: Oxygen Activity Tolerance: Patient tolerated treatment well Patient left: in chair;with call bell/phone within reach Nurse Communication: Mobility status PT Visit Diagnosis: Difficulty in walking, not elsewhere classified (R26.2)    Time: [id number] PT Time Calculation (min) (ACUTE ONLY): 42 min   Charges:   PT Evaluation $PT Eval Moderate Complexity: 1 Mod PT Treatments $Gait Training: 8-22 mins $Self Care/Home Management: [place] Pager [phone] Office [phone]   [name] 04/24/2019, 8:57 AM

## 2019-04-25 LAB — BPAM FFP
Blood Product Expiration Date: 202011072130
ISSUE DATE / TIME: 202011062207
Unit Type and Rh: 5100

## 2019-04-25 LAB — COMPREHENSIVE METABOLIC PANEL
ALT: 114 U/L — ABNORMAL HIGH (ref 0–44)
AST: 62 U/L — ABNORMAL HIGH (ref 15–41)
Albumin: 3.1 g/dL — ABNORMAL LOW (ref 3.5–5.0)
Alkaline Phosphatase: 78 U/L (ref 38–126)
Anion gap: 15 (ref 5–15)
BUN: 14 mg/dL (ref 8–23)
CO2: 24 mmol/L (ref 22–32)
Calcium: 8.8 mg/dL — ABNORMAL LOW (ref 8.9–10.3)
Chloride: 101 mmol/L (ref 98–111)
Creatinine, Ser: 0.62 mg/dL (ref 0.44–1.00)
GFR calc Af Amer: >60 mL/min (ref >60)
GFR calc non Af Amer: >60 mL/min (ref >60)
Glucose, Bld: 95 mg/dL (ref 70–99)
Potassium: 3.5 mmol/L (ref 3.5–5.1)
Sodium: 140 mmol/L (ref 135–145)
Total Bilirubin: 0.7 mg/dL (ref 0.3–1.2)
Total Protein: 6.8 g/dL (ref 6.5–8.1)

## 2019-04-25 LAB — D-DIMER, QUANTITATIVE: D-Dimer, Quant: 1.59 ug/mL-FEU — ABNORMAL HIGH (ref 0.00–0.50)

## 2019-04-25 LAB — ABO/RH: ABO/RH(D): O POS

## 2019-04-25 LAB — CBC WITH DIFFERENTIAL/PLATELET
Abs Immature Granulocytes: 0.06 10*3/uL (ref 0.00–0.07)
Basophils Absolute: 0 10*3/uL (ref 0.0–0.1)
Basophils Relative: 0 %
Eosinophils Absolute: 0 10*3/uL (ref 0.0–0.5)
Eosinophils Relative: 0 %
HCT: 40.1 % (ref 36.0–46.0)
Hemoglobin: 13.2 g/dL (ref 12.0–15.0)
Immature Granulocytes: 1 %
Lymphocytes Relative: 9 %
Lymphs Abs: 0.7 10*3/uL (ref 0.7–4.0)
MCH: 29.7 pg (ref 26.0–34.0)
MCHC: 32.9 g/dL (ref 30.0–36.0)
MCV: 90.1 fL (ref 80.0–100.0)
Monocytes Absolute: 0.5 10*3/uL (ref 0.1–1.0)
Monocytes Relative: 7 %
Neutro Abs: 6.1 10*3/uL (ref 1.7–7.7)
Neutrophils Relative %: 83 %
Platelets: 366 10*3/uL (ref 150–400)
RBC: 4.45 MIL/uL (ref 3.87–5.11)
RDW: 13.3 % (ref 11.5–15.5)
WBC: 7.4 10*3/uL (ref 4.0–10.5)
nRBC: 0 % (ref 0.0–0.2)

## 2019-04-25 LAB — FERRITIN: Ferritin: 291 ng/mL (ref 11–307)

## 2019-04-25 LAB — C-REACTIVE PROTEIN: CRP: 1.8 mg/dL — ABNORMAL HIGH (ref <1.0)

## 2019-04-25 LAB — PREPARE FRESH FROZEN PLASMA: Unit division: 0

## 2019-04-25 MED ORDER — POTASSIUM CHLORIDE CRYS ER 20 MEQ PO TBCR
40.0000 meq | EXTENDED_RELEASE_TABLET | Freq: Once | ORAL | Status: AC
  Administered 2019-04-25: 40 meq via ORAL
  Filled 2019-04-25: qty 2

## 2019-04-25 MED ORDER — DIAZEPAM 5 MG PO TABS
5.0000 mg | ORAL_TABLET | Freq: Once | ORAL | Status: AC
  Administered 2019-04-25: 5 mg via ORAL
  Filled 2019-04-25: qty 1

## 2019-04-25 NOTE — Progress Notes (Signed)
PROGRESS NOTE                                                                                                                                                                                                             Patient Demographics:    Brittany [name], is a 63 y.o. female, DOB - [birth year], [id number]  Outpatient Primary MD for the patient is Caren Macadam, MD   Admit date - 04/21/2019   LOS - 3  Chief Complaint  Patient presents with   GI Bleeding   Cough       Brief Narrative: Patient is a 63 y.o. female with PMHx of depression, bipolar disorder, PTSD, GERD, anxiety, asthma who presented with shortness of breath x1 week-she was found to have acute hypoxic respiratory failure secondary to COVID-19 pneumonia.  Her review of systems was instantly positive for melena-without significant anemia.  See below for further details.   Subjective:   Patient in bed, appears comfortable, denies any headache, no fever, no chest pain or pressure, no shortness of breath , no abdominal pain. No focal weakness.   Assessment  & Plan :   Acute Hypoxic Resp Failure due to Covid 19 Viral pneumonia: She has been adequately treated with IV steroids, convalescent plasma and remdesivir, initially was quite hypoxic and was requiring 4 L nasal cannula oxygen, currently in room she is requiring 2 L nasal cannula oxygen and appears to be in no distress.  Encouraged her to sit up in chair and use flutter valve and I-S in daytime and prone in bed at night.  We will continue to monitor clinically.  For now improving.      COVID-19 Labs:  Recent Labs    04/23/19 0643 04/24/19 0549 04/25/19 0651  DDIMER 1.63* 1.46* 1.59*  FERRITIN 398* 443* 291  CRP 11.4* 3.6* 1.8*    Lab Results  Component Value Date   SARSCOV2NAA POSITIVE (A) 04/22/2019     COVID-19 Medications: Steroids: 11/4>> Remdesivir: 11/4>> Convalescent Plasma:  X1 on 11/6     ?  Melena: Incidentally found to have melena at the time of review of systems-last EGD October 2019 without acute findings-FOBT stools negative.  Hemoglobin stable-avoid NSAIDs-continue supportive care.  If reoccurs outpatient GI follow-up.  Bronchial asthma: Stable-continue bronchodilators  Bipolar disorder: Stable  Anxiety: Continue BuSpar and as needed  Valium.  Obesity: BMI of 32.  Follow with PCP for weight loss.     Condition - Stable  Family Communication  :  Daughter updated over the phone 11/7  Code Status :  Full Code  Diet :  Diet Order            Diet regular Room service appropriate? Yes; Fluid consistency: Thin  Diet effective now               Disposition Plan  :  Remain hospitalized  Barriers to discharge: Hypoxia requiring O2 supplementation/complete 5 days of IV Remdesivir  Consults  :  None  Procedures  :  None  GI prophylaxis: H2 Blocker  Antibiotics  :    Anti-infectives (From admission, onward)   Start     Dose/Rate Route Frequency Ordered Stop   04/23/19 1600  remdesivir 100 mg in sodium chloride 0.9 % 250 mL IVPB     100 mg 500 mL/hr over 30 Minutes Intravenous Every 24 hours 04/22/19 1727 04/27/19 1559   04/22/19 1830  remdesivir 200 mg in sodium chloride 0.9 % 250 mL IVPB     200 mg 500 mL/hr over 30 Minutes Intravenous Once 04/22/19 1727 04/22/19 2200     DVT Prophylaxis  :  Lovenox  Inpatient Medications  Scheduled Meds:  benzonatate  200 mg Oral TID   busPIRone  15 mg Oral TID   colestipol  1 g Oral BID   dexamethasone (DECADRON) injection  6 mg Intravenous Q24H   doxazosin  6 mg Oral QHS   enoxaparin (LOVENOX) injection  40 mg Subcutaneous Q24H   famotidine  20 mg Oral BID   fluticasone  2 spray Each Nare Daily   fluticasone  2 puff Inhalation BID   mirtazapine  15 mg Oral QHS   montelukast  10 mg Oral QHS   potassium chloride  40 mEq Oral Once   vitamin C  500 mg Oral Daily   zinc sulfate   220 mg Oral Daily   Continuous Infusions:  remdesivir 100 mg in NS 250 mL 100 mg (04/24/19 1613)   PRN Meds:.acetaminophen, albuterol, diazepam, guaiFENesin-dextromethorphan, prochlorperazine   Time Spent in minutes  25  See all Orders from today for further details   Lala Lund M.D on 04/25/2019 at 9:57 AM  To page go to [email or web address] - use universal password  [facility] -  Office  [phone]    Objective:   Vitals:   04/25/19 0200 04/25/19 0300 04/25/19 0604 04/25/19 0847  BP: 107/67 134/74 124/67 127/67  Pulse: 68 68 80 66  Resp: (!) 24 (!) 22 (!) 26 12  Temp:  98.1 F (36.7 C)  98.4 F (36.9 C)  TempSrc:  Oral  Oral  SpO2: 94% 96% 91% 92%  Weight:      Height:        Wt Readings from Last 3 Encounters:  04/21/19 78.9 kg  01/22/19 80.7 kg  11/28/18 84.9 kg     Intake/Output Summary (Last 24 hours) at 04/25/2019 0957 Last data filed at 04/25/2019 0600 Gross per 24 hour  Intake 600 ml  Output --  Net 600 ml     Physical Exam  Awake Alert, Oriented X 3, No new F.N deficits, Normal affect [place].AT,PERRAL Supple Neck,No JVD, No cervical lymphadenopathy appriciated.  Symmetrical Chest wall movement, Good air movement bilaterally, CTAB RRR,No Gallops, Rubs or new Murmurs, No Parasternal Heave +ve B.Sounds, Abd Soft, No tenderness, No organomegaly appriciated, No rebound -  guarding or rigidity. No Cyanosis, Clubbing or edema, No new Rash or bruise    Data Review:    CBC Recent Labs  Lab 04/21/19 1721 04/23/19 0643 04/24/19 0549 04/25/19 0651  WBC 3.4* 5.6 7.3 7.4  HGB 13.3 14.0 13.5 13.2  HCT 41.0 43.7 42.5 40.1  PLT 261 339 355 366  MCV 91.3 92.4 92.2 90.1  MCH 29.6 29.6 29.3 29.7  MCHC 32.4 32.0 31.8 32.9  RDW 13.4 13.9 13.7 13.3  LYMPHSABS  --  0.5* 0.6* 0.7  MONOABS  --  0.4 0.5 0.5  EOSABS  --  0.0 0.0 0.0  BASOSABS  --  0.0 0.0 0.0    Chemistries  Recent Labs  Lab 04/21/19 1721 04/23/19 0643 04/24/19 0549  04/25/19 0651  NA 139 138 142 140  K 3.8 4.2 3.8 3.5  CL 103 104 105 101  CO2 22 25 25 24   GLUCOSE 110* 96 101* 95  BUN 5* 11 13 14   CREATININE 0.80 0.59 0.55 0.62  CALCIUM 8.5* 8.8* 9.0 8.8*  MG  --  2.1  --   --   AST 13* 43* 81* 62*  ALT 19 31 78* 114*  ALKPHOS 58 74 68 78  BILITOT 0.2* 0.5 0.6 0.7   ------------------------------------------------------------------------------------------------------------------ No results for input(s): CHOL, HDL, LDLCALC, TRIG, CHOLHDL, LDLDIRECT in the last 72 hours.  Lab Results  Component Value Date   HGBA1C 4.9 11/28/2018   ------------------------------------------------------------------------------------------------------------------ No results for input(s): TSH, T4TOTAL, T3FREE, THYROIDAB in the last 72 hours.  Invalid input(s): FREET3 ------------------------------------------------------------------------------------------------------------------ Recent Labs    04/24/19 0549 04/25/19 0651  FERRITIN 443* 291    Coagulation profile No results for input(s): INR, PROTIME in the last 168 hours.  Recent Labs    04/24/19 0549 04/25/19 0651  DDIMER 1.46* 1.59*    Cardiac Enzymes No results for input(s): CKMB, TROPONINI, MYOGLOBIN in the last 168 hours.  Invalid input(s): CK ------------------------------------------------------------------------------------------------------------------ No results found for: BNP  Micro Results Recent Results (from the past 240 hour(s))  SARS CORONAVIRUS 2 (TAT 6-24 HRS) Nasopharyngeal Nasopharyngeal Swab     Status: Abnormal   Collection Time: 04/22/19  2:46 AM   Specimen: Nasopharyngeal Swab  Result Value Ref Range Status   SARS Coronavirus 2 POSITIVE (A) NEGATIVE Final    Comment: RESULT CALLED TO, READ BACK BY AND VERIFIED WITH: Precious Reel RN 14:30 04/22/19 ([email or web address]) (NOTE) SARS-CoV-2 target nucleic acids are DETECTED. The SARS-CoV-2 RNA is generally detectable in upper and  lower respiratory specimens during the acute phase of infection. Positive results are indicative of active infection with SARS-CoV-2. Clinical  correlation with patient history and other diagnostic information is necessary to determine patient infection status. Positive results do  not rule out bacterial infection or co-infection with other viruses. The expected result is Negative. Fact Sheet for Patients: SugarRoll.be Fact Sheet for Healthcare Providers: https://www.woods-mathews.com/ This test is not yet approved or cleared by the [place] FDA and  has been authorized for detection and/or diagnosis of SARS-CoV-2 by FDA under an Emergency Use Authorization (EUA). This EUA will remain  in effect (meaning this test can be used) for  the duration of the COVID-19 declaration under Section 564(b)(1) of the Act, 21 U.S.C. section 360bbb-3(b)(1), unless the authorization is terminated or revoked sooner. Performed at [facility], [place] [address], [place], [place] [address]   Blood Culture (routine x 2)     Status: None (Preliminary result)   Collection Time: 04/22/19  3:30 AM  Specimen: BLOOD RIGHT HAND  Result Value Ref Range Status   Specimen Description BLOOD RIGHT HAND  Final   Special Requests   Final    BOTTLES DRAWN AEROBIC AND ANAEROBIC Blood Culture adequate volume   Culture   Final    NO GROWTH 3 DAYS Performed at [facility], [address] [place], [place] [address]    Report Status PENDING  Incomplete  Blood Culture (routine x 2)     Status: None (Preliminary result)   Collection Time: 04/22/19  3:30 AM   Specimen: BLOOD  Result Value Ref Range Status   Specimen Description BLOOD RIGHT ANTECUBITAL  Final   Special Requests   Final    BOTTLES DRAWN AEROBIC AND ANAEROBIC Blood Culture adequate volume   Culture   Final    NO GROWTH 3 DAYS Performed at [facility], [place] [address], [place], [place]  [address]    Report Status PENDING  Incomplete    Radiology Reports Ct Angio Chest Pe W Or Wo Contrast  Result Date: 04/22/2019 CLINICAL DATA:  63 year old female with a history of shortness of breath and abnormal chest x-ray EXAM: CT ANGIOGRAPHY CHEST WITH CONTRAST TECHNIQUE: Multidetector CT imaging of the chest was performed using the standard protocol during bolus administration of intravenous contrast. Multiplanar CT image reconstructions and MIPs were obtained to evaluate the vascular anatomy. CONTRAST:  152mL OMNIPAQUE IOHEXOL 350 MG/ML SOLN COMPARISON:  04/10/2009, chest x-ray 04/21/2019, 06/21/2016 FINDINGS: Cardiovascular: Heart: No cardiomegaly. No pericardial fluid/thickening. Calcifications of the left anterior descending coronary artery. Aorta: Unremarkable course, caliber, contour of the thoracic aorta. No aneurysm or dissection flap. No periaortic fluid. Pulmonary arteries: No filling defects of the main pulmonary artery, lobar arteries, segmental or proximal subsegmental arteries. Mediastinum/Nodes: Small lymph nodes of the mediastinum. Unremarkable appearance of the thoracic esophagus. Unremarkable thoracic inlet. Lungs/Pleura: Pattern of peripheral ground-glass and linear opacities of the bilateral lungs, more prominent on the left and predominantly in a peripheral distribution/subpleural distribution. There is a gradient towards the upper lungs. No pleural effusion or pneumothorax. Linear changes in the bilateral lung bases. No endotracheal or endobronchial debris. Upper Abdomen: No acute. Musculoskeletal: No acute displaced fracture. Degenerative changes of the spine. Review of the MIP images confirms the above findings. IMPRESSION: CT is negative for pulmonary emboli. There are a spectrum of findings in the lungs which are compatible with acute/subacute atypical infection (as well as other non-infectious etiologies), such as COVID-19 infection. Coronary artery disease. Electronically Signed    By: Corrie Mckusick D.O.   On: 04/22/2019 20:55   Dg Chest Port 1 View  Result Date: 04/21/2019 CLINICAL DATA:  Shortness of breath and cough. Former smoker. EXAM: PORTABLE CHEST 1 VIEW COMPARISON:  06/21/2016 FINDINGS: Heart size is at the upper limits of normal. Asymmetric ill-defined areas of airspace opacity are seen in the left lung which are new, and suspicious for viral or other atypical pneumonia. No evidence of pleural effusion. IMPRESSION: New ill-defined areas of airspace opacity in left lung, suspicious for viral or other atypical pneumonia. Electronically Signed   By: Marlaine Hind M.D.   On: 04/21/2019 20:07

## 2019-04-25 NOTE — Plan of Care (Signed)
Pt received the ordered plasma per [facility] protocol. No adverse reaction noted.   Problem: Education: Goal: Knowledge of risk factors and measures for prevention of condition will improve Outcome: Progressing   Problem: Coping: Goal: Psychosocial and spiritual needs will be supported Outcome: Progressing   Problem: Respiratory: Goal: Will maintain a patent airway Outcome: Progressing Goal: Complications related to the disease process, condition or treatment will be avoided or minimized Outcome: Progressing   Problem: Education: Goal: Knowledge of General Education information will improve Description: Including pain rating scale, medication(s)/side effects and non-pharmacologic comfort measures Outcome: Progressing   Problem: Health Behavior/Discharge Planning: Goal: Ability to manage health-related needs will improve Outcome: Progressing   Problem: Clinical Measurements: Goal: Ability to maintain clinical measurements within normal limits will improve Outcome: Progressing Goal: Will remain free from infection Outcome: Progressing Goal: Diagnostic test results will improve Outcome: Progressing Goal: Respiratory complications will improve Outcome: Progressing Goal: Cardiovascular complication will be avoided Outcome: Progressing   Problem: Activity: Goal: Risk for activity intolerance will decrease Outcome: Progressing   Problem: Nutrition: Goal: Adequate nutrition will be maintained Outcome: Progressing   Problem: Coping: Goal: Level of anxiety will decrease Outcome: Progressing   Problem: Elimination: Goal: Will not experience complications related to bowel motility Outcome: Progressing Goal: Will not experience complications related to urinary retention Outcome: Progressing   Problem: Pain Managment: Goal: General experience of comfort will improve Outcome: Progressing   Problem: Safety: Goal: Ability to remain free from injury will improve Outcome:  Progressing   Problem: Skin Integrity: Goal: Risk for impaired skin integrity will decrease Outcome: Progressing

## 2019-04-25 NOTE — Progress Notes (Signed)
Pt used BSC safely today throughout shift. Pt day 4/5 IV Remdesivir today. No reports of pain. Anxiety present -PRN valium Pt face-timed w/ family members several times today. Bathed today.

## 2019-04-26 ENCOUNTER — Inpatient Hospital Stay (HOSPITAL_COMMUNITY): Payer: PPO

## 2019-04-26 LAB — CBC WITH DIFFERENTIAL/PLATELET
Abs Immature Granulocytes: 0.1 10*3/uL — ABNORMAL HIGH (ref 0.00–0.07)
Basophils Absolute: 0 10*3/uL (ref 0.0–0.1)
Basophils Relative: 0 %
Eosinophils Absolute: 0.1 10*3/uL (ref 0.0–0.5)
Eosinophils Relative: 2 %
HCT: 39.7 % (ref 36.0–46.0)
Hemoglobin: 12.7 g/dL (ref 12.0–15.0)
Immature Granulocytes: 1 %
Lymphocytes Relative: 8 %
Lymphs Abs: 0.7 10*3/uL (ref 0.7–4.0)
MCH: 28.9 pg (ref 26.0–34.0)
MCHC: 32 g/dL (ref 30.0–36.0)
MCV: 90.4 fL (ref 80.0–100.0)
Monocytes Absolute: 0.7 10*3/uL (ref 0.1–1.0)
Monocytes Relative: 8 %
Neutro Abs: 6.8 10*3/uL (ref 1.7–7.7)
Neutrophils Relative %: 81 %
Platelets: 402 10*3/uL — ABNORMAL HIGH (ref 150–400)
RBC: 4.39 MIL/uL (ref 3.87–5.11)
RDW: 13.1 % (ref 11.5–15.5)
WBC: 8.3 10*3/uL (ref 4.0–10.5)
nRBC: 0 % (ref 0.0–0.2)

## 2019-04-26 LAB — COMPREHENSIVE METABOLIC PANEL
ALT: 81 U/L — ABNORMAL HIGH (ref 0–44)
AST: 31 U/L (ref 15–41)
Albumin: 3 g/dL — ABNORMAL LOW (ref 3.5–5.0)
Alkaline Phosphatase: 70 U/L (ref 38–126)
Anion gap: 13 (ref 5–15)
BUN: 13 mg/dL (ref 8–23)
CO2: 24 mmol/L (ref 22–32)
Calcium: 8.4 mg/dL — ABNORMAL LOW (ref 8.9–10.3)
Chloride: 103 mmol/L (ref 98–111)
Creatinine, Ser: 0.5 mg/dL (ref 0.44–1.00)
GFR calc Af Amer: >60 mL/min (ref >60)
GFR calc non Af Amer: >60 mL/min (ref >60)
Glucose, Bld: 94 mg/dL (ref 70–99)
Potassium: 3.4 mmol/L — ABNORMAL LOW (ref 3.5–5.1)
Sodium: 140 mmol/L (ref 135–145)
Total Bilirubin: 0.8 mg/dL (ref 0.3–1.2)
Total Protein: 6.2 g/dL — ABNORMAL LOW (ref 6.5–8.1)

## 2019-04-26 LAB — GLUCOSE, CAPILLARY
Glucose-Capillary: 154 mg/dL — ABNORMAL HIGH (ref 70–99)
Glucose-Capillary: 159 mg/dL — ABNORMAL HIGH (ref 70–99)
Glucose-Capillary: 171 mg/dL — ABNORMAL HIGH (ref 70–99)

## 2019-04-26 LAB — C-REACTIVE PROTEIN: CRP: 2.6 mg/dL — ABNORMAL HIGH (ref <1.0)

## 2019-04-26 LAB — BRAIN NATRIURETIC PEPTIDE: B Natriuretic Peptide: 103.6 pg/mL — ABNORMAL HIGH (ref 0.0–100.0)

## 2019-04-26 LAB — D-DIMER, QUANTITATIVE: D-Dimer, Quant: 1.66 ug/mL-FEU — ABNORMAL HIGH (ref 0.00–0.50)

## 2019-04-26 LAB — MAGNESIUM: Magnesium: 1.8 mg/dL (ref 1.7–2.4)

## 2019-04-26 MED ORDER — INSULIN ASPART 100 UNIT/ML ~~LOC~~ SOLN
0.0000 [IU] | Freq: Three times a day (TID) | SUBCUTANEOUS | Status: DC
  Administered 2019-04-26 – 2019-04-27 (×2): 2 [IU] via SUBCUTANEOUS
  Administered 2019-04-27: 1 [IU] via SUBCUTANEOUS
  Administered 2019-04-27: 2 [IU] via SUBCUTANEOUS
  Administered 2019-04-29 – 2019-05-01 (×4): 1 [IU] via SUBCUTANEOUS

## 2019-04-26 MED ORDER — POTASSIUM CHLORIDE CRYS ER 20 MEQ PO TBCR
20.0000 meq | EXTENDED_RELEASE_TABLET | Freq: Once | ORAL | Status: AC
  Administered 2019-04-26: 20 meq via ORAL
  Filled 2019-04-26: qty 1

## 2019-04-26 MED ORDER — POTASSIUM CHLORIDE CRYS ER 20 MEQ PO TBCR
40.0000 meq | EXTENDED_RELEASE_TABLET | Freq: Once | ORAL | Status: AC
  Administered 2019-04-26: 09:00:00 40 meq via ORAL
  Filled 2019-04-26: qty 2

## 2019-04-26 MED ORDER — METHYLPREDNISOLONE SODIUM SUCC 125 MG IJ SOLR
60.0000 mg | Freq: Two times a day (BID) | INTRAMUSCULAR | Status: DC
  Administered 2019-04-26 – 2019-04-27 (×3): 60 mg via INTRAVENOUS
  Filled 2019-04-26 (×3): qty 2

## 2019-04-26 MED ORDER — TOCILIZUMAB 400 MG/20ML IV SOLN
630.0000 mg | Freq: Once | INTRAVENOUS | Status: AC
  Administered 2019-04-26: 630 mg via INTRAVENOUS
  Filled 2019-04-26: qty 31.5

## 2019-04-26 MED ORDER — INSULIN ASPART 100 UNIT/ML ~~LOC~~ SOLN: 0.0000 [IU] | Freq: Every day | SUBCUTANEOUS | Status: DC

## 2019-04-26 MED ORDER — DIAZEPAM 5 MG PO TABS
5.0000 mg | ORAL_TABLET | Freq: Once | ORAL | Status: AC
  Administered 2019-04-26: 5 mg via ORAL
  Filled 2019-04-26: qty 1

## 2019-04-26 NOTE — Progress Notes (Signed)
PROGRESS NOTE                                                                                                                                                                                                             Patient Demographics:    Brittany [name], is a 63 y.o. female, DOB - 1955/10/06, [id number]  Outpatient Primary MD for the patient is Caren Macadam, MD   Admit date - 04/21/2019   LOS - 4  Chief Complaint  Patient presents with   GI Bleeding   Cough       Brief Narrative: Patient is a 63 y.o. female with PMHx of depression, bipolar disorder, PTSD, GERD, anxiety, asthma who presented with shortness of breath x1 week-she was found to have acute hypoxic respiratory failure secondary to COVID-19 pneumonia.  Her review of systems was instantly positive for melena-without significant anemia.  See below for further details.   Subjective:   Patient in bed, appears comfortable, denies any headache, no fever, no chest pain or pressure, +ve shortness of breath, feels more short of breath and fatigued than before, no abdominal pain. No focal weakness.    Assessment  & Plan :   Acute Hypoxic Resp Failure due to Covid 19 Viral pneumonia: She has been adequately treated with IV steroids, convalescent plasma and remdesivir, unfortunately she is still quite hypoxic 3 days after appropriate treatment, her CRP is falsely lowered now after continued IV steroid use CRP initially was 15, have increased IV steroid dose but if she continues to remain hypoxic requiring over 4 L of oxygen and feeling worse and more fatigued on 04/26/2019 will use Actemra.   Actemra off label use - patient was told that if COVID-19 pneumonitis gets worse we might potentially use Actemra off label, she denies any known history of tuberculosis or hepatitis, understands the risks and benefits and wants to proceed with Actemra treatment if  required.  SpO2: (!) 89 % O2 Flow Rate (L/min): 4 L/min  COVID-19 Labs:  Recent Labs    04/24/19 0549 04/25/19 0651 04/26/19 0025  DDIMER 1.46* 1.59* 1.66*  FERRITIN 443* 291  --   CRP 3.6* 1.8* 2.6*    Lab Results  Component Value Date   SARSCOV2NAA POSITIVE (A) 04/22/2019     COVID-19 Medications: Steroids: 11/4>> Remdesivir: 11/4>> Convalescent Plasma: X1 on  11/6     ?  Melena: Incidentally found to have melena at the time of review of systems-last EGD October 2019 without acute findings-FOBT stools negative.  Hemoglobin stable-avoid NSAIDs-continue supportive care.  If reoccurs outpatient GI follow-up.  Bronchial asthma: Stable-continue bronchodilators  Bipolar disorder: Stable  Anxiety: Continue BuSpar and as needed Valium.  Obesity: BMI of 32.  Follow with PCP for weight loss.     Condition - Stable  Family Communication  :  Daughter updated over the phone 11/7  Code Status :  Full Code  Diet :  Diet Order            Diet regular Room service appropriate? Yes; Fluid consistency: Thin  Diet effective now               Disposition Plan  :  Remain hospitalized  Barriers to discharge: Hypoxia requiring O2 supplementation/complete 5 days of IV Remdesivir  Consults  :  None  Procedures  :  None  GI prophylaxis: H2 Blocker  Antibiotics  :    Anti-infectives (From admission, onward)   Start     Dose/Rate Route Frequency Ordered Stop   04/23/19 1600  remdesivir 100 mg in sodium chloride 0.9 % 250 mL IVPB     100 mg 500 mL/hr over 30 Minutes Intravenous Every 24 hours 04/22/19 1727 04/27/19 1559   04/22/19 1830  remdesivir 200 mg in sodium chloride 0.9 % 250 mL IVPB     200 mg 500 mL/hr over 30 Minutes Intravenous Once 04/22/19 1727 04/22/19 2200     DVT Prophylaxis  :  Lovenox  Inpatient Medications  Scheduled Meds:  benzonatate  200 mg Oral TID   busPIRone  15 mg Oral TID   colestipol  1 g Oral BID   doxazosin  6 mg Oral QHS    enoxaparin (LOVENOX) injection  40 mg Subcutaneous Q24H   famotidine  20 mg Oral BID   fluticasone  2 spray Each Nare Daily   fluticasone  2 puff Inhalation BID   insulin aspart  0-5 Units Subcutaneous QHS   insulin aspart  0-9 Units Subcutaneous TID WC   methylPREDNISolone (SOLU-MEDROL) injection  60 mg Intravenous Q12H   mirtazapine  15 mg Oral QHS   montelukast  10 mg Oral QHS   potassium chloride  20 mEq Oral Once   vitamin C  500 mg Oral Daily   zinc sulfate  220 mg Oral Daily   Continuous Infusions:  remdesivir 100 mg in NS 250 mL Stopped (04/25/19 1718)   PRN Meds:.acetaminophen, albuterol, diazepam, guaiFENesin-dextromethorphan, prochlorperazine   Time Spent in minutes  25  See all Orders from today for further details   Lala Lund M.D on 04/26/2019 at 9:29 AM  To page go to [email or web address] - use universal password  [facility] -  Office  [phone]    Objective:   Vitals:   04/25/19 2128 04/26/19 0130 04/26/19 0356 04/26/19 0737  BP: 128/83   (!) 144/79  Pulse:    (!) 104  Resp:    (!) 22  Temp:   98.6 F (37 C) 98.8 F (37.1 C)  TempSrc:   Oral Oral  SpO2:  91%  (!) 89%  Weight:      Height:        Wt Readings from Last 3 Encounters:  04/21/19 78.9 kg  01/22/19 80.7 kg  11/28/18 84.9 kg    No intake or output data in the 24 hours ending 04/26/19  0929   Physical Exam  Awake Alert, Oriented X 3, No new F.N deficits, Normal affect [place].AT,PERRAL Supple Neck,No JVD, No cervical lymphadenopathy appriciated.  Symmetrical Chest wall movement, Good air movement bilaterally, CTAB RRR,No Gallops, Rubs or new Murmurs, No Parasternal Heave +ve B.Sounds, Abd Soft, No tenderness, No organomegaly appriciated, No rebound - guarding or rigidity. No Cyanosis, Clubbing or edema, No new Rash or bruise    Data Review:    CBC Recent Labs  Lab 04/21/19 1721 04/23/19 0643 04/24/19 0549 04/25/19 0651 04/26/19 0025  WBC 3.4* 5.6 7.3 7.4  8.3  HGB 13.3 14.0 13.5 13.2 12.7  HCT 41.0 43.7 42.5 40.1 39.7  PLT 261 339 355 366 402*  MCV 91.3 92.4 92.2 90.1 90.4  MCH 29.6 29.6 29.3 29.7 28.9  MCHC 32.4 32.0 31.8 32.9 32.0  RDW 13.4 13.9 13.7 13.3 13.1  LYMPHSABS  --  0.5* 0.6* 0.7 0.7  MONOABS  --  0.4 0.5 0.5 0.7  EOSABS  --  0.0 0.0 0.0 0.1  BASOSABS  --  0.0 0.0 0.0 0.0    Chemistries  Recent Labs  Lab 04/21/19 1721 04/23/19 0643 04/24/19 0549 04/25/19 0651 04/26/19 0025  NA 139 138 142 140 140  K 3.8 4.2 3.8 3.5 3.4*  CL 103 104 105 101 103  CO2 22 25 25 24 24   GLUCOSE 110* 96 101* 95 94  BUN 5* 11 13 14 13   CREATININE 0.80 0.59 0.55 0.62 0.50  CALCIUM 8.5* 8.8* 9.0 8.8* 8.4*  MG  --  2.1  --   --  1.8  AST 13* 43* 81* 62* 31  ALT 19 31 78* 114* 81*  ALKPHOS 58 74 68 78 70  BILITOT 0.2* 0.5 0.6 0.7 0.8   ------------------------------------------------------------------------------------------------------------------ No results for input(s): CHOL, HDL, LDLCALC, TRIG, CHOLHDL, LDLDIRECT in the last 72 hours.  Lab Results  Component Value Date   HGBA1C 4.9 11/28/2018   ------------------------------------------------------------------------------------------------------------------ No results for input(s): TSH, T4TOTAL, T3FREE, THYROIDAB in the last 72 hours.  Invalid input(s): FREET3 ------------------------------------------------------------------------------------------------------------------ Recent Labs    04/24/19 0549 04/25/19 0651  FERRITIN 443* 291    Coagulation profile No results for input(s): INR, PROTIME in the last 168 hours.  Recent Labs    04/25/19 0651 04/26/19 0025  DDIMER 1.59* 1.66*    Cardiac Enzymes No results for input(s): CKMB, TROPONINI, MYOGLOBIN in the last 168 hours.  Invalid input(s): CK ------------------------------------------------------------------------------------------------------------------    Component Value Date/Time   BNP 103.6 (H)  04/26/2019 0025    Micro Results Recent Results (from the past 240 hour(s))  SARS CORONAVIRUS 2 (TAT 6-24 HRS) Nasopharyngeal Nasopharyngeal Swab     Status: Abnormal   Collection Time: 04/22/19  2:46 AM   Specimen: Nasopharyngeal Swab  Result Value Ref Range Status   SARS Coronavirus 2 POSITIVE (A) NEGATIVE Final    Comment: RESULT CALLED TO, READ BACK BY AND VERIFIED WITH: Precious Reel RN 14:30 04/22/19 ([email or web address]) (NOTE) SARS-CoV-2 target nucleic acids are DETECTED. The SARS-CoV-2 RNA is generally detectable in upper and lower respiratory specimens during the acute phase of infection. Positive results are indicative of active infection with SARS-CoV-2. Clinical  correlation with patient history and other diagnostic information is necessary to determine patient infection status. Positive results do  not rule out bacterial infection or co-infection with other viruses. The expected result is Negative. Fact Sheet for Patients: SugarRoll.be Fact Sheet for Healthcare Providers: https://www.woods-mathews.com/ This test is not yet approved or cleared by the [place] FDA  and  has been authorized for detection and/or diagnosis of SARS-CoV-2 by FDA under an Emergency Use Authorization (EUA). This EUA will remain  in effect (meaning this test can be used) for  the duration of the COVID-19 declaration under Section 564(b)(1) of the Act, 21 U.S.C. section 360bbb-3(b)(1), unless the authorization is terminated or revoked sooner. Performed at [facility], [place] [address], [place], [place] [address]   Blood Culture (routine x 2)     Status: None (Preliminary result)   Collection Time: 04/22/19  3:30 AM   Specimen: BLOOD RIGHT HAND  Result Value Ref Range Status   Specimen Description BLOOD RIGHT HAND  Final   Special Requests   Final    BOTTLES DRAWN AEROBIC AND ANAEROBIC Blood Culture adequate volume   Culture   Final    NO GROWTH 4  DAYS Performed at [facility], [place] [address], [place], [place] [address]    Report Status PENDING  Incomplete  Blood Culture (routine x 2)     Status: None (Preliminary result)   Collection Time: 04/22/19  3:30 AM   Specimen: BLOOD  Result Value Ref Range Status   Specimen Description BLOOD RIGHT ANTECUBITAL  Final   Special Requests   Final    BOTTLES DRAWN AEROBIC AND ANAEROBIC Blood Culture adequate volume   Culture   Final    NO GROWTH 4 DAYS Performed at [facility], [place] [address], [place], [place] [address]    Report Status PENDING  Incomplete    Radiology Reports Ct Angio Chest Pe W Or Wo Contrast  Result Date: 04/22/2019 CLINICAL DATA:  63 year old female with a history of shortness of breath and abnormal chest x-ray EXAM: CT ANGIOGRAPHY CHEST WITH CONTRAST TECHNIQUE: Multidetector CT imaging of the chest was performed using the standard protocol during bolus administration of intravenous contrast. Multiplanar CT image reconstructions and MIPs were obtained to evaluate the vascular anatomy. CONTRAST:  167mL OMNIPAQUE IOHEXOL 350 MG/ML SOLN COMPARISON:  04/10/2009, chest x-ray 04/21/2019, 06/21/2016 FINDINGS: Cardiovascular: Heart: No cardiomegaly. No pericardial fluid/thickening. Calcifications of the left anterior descending coronary artery. Aorta: Unremarkable course, caliber, contour of the thoracic aorta. No aneurysm or dissection flap. No periaortic fluid. Pulmonary arteries: No filling defects of the main pulmonary artery, lobar arteries, segmental or proximal subsegmental arteries. Mediastinum/Nodes: Small lymph nodes of the mediastinum. Unremarkable appearance of the thoracic esophagus. Unremarkable thoracic inlet. Lungs/Pleura: Pattern of peripheral ground-glass and linear opacities of the bilateral lungs, more prominent on the left and predominantly in a peripheral distribution/subpleural distribution. There is a gradient towards the upper lungs. No pleural  effusion or pneumothorax. Linear changes in the bilateral lung bases. No endotracheal or endobronchial debris. Upper Abdomen: No acute. Musculoskeletal: No acute displaced fracture. Degenerative changes of the spine. Review of the MIP images confirms the above findings. IMPRESSION: CT is negative for pulmonary emboli. There are a spectrum of findings in the lungs which are compatible with acute/subacute atypical infection (as well as other non-infectious etiologies), such as COVID-19 infection. Coronary artery disease. Electronically Signed   By: Corrie Mckusick D.O.   On: 04/22/2019 20:55   Dg Chest Port 1 View  Result Date: 04/21/2019 CLINICAL DATA:  Shortness of breath and cough. Former smoker. EXAM: PORTABLE CHEST 1 VIEW COMPARISON:  06/21/2016 FINDINGS: Heart size is at the upper limits of normal. Asymmetric ill-defined areas of airspace opacity are seen in the left lung which are new, and suspicious for viral or other atypical pneumonia.  No evidence of pleural effusion. IMPRESSION: New ill-defined areas of airspace opacity in left lung, suspicious for viral or other atypical pneumonia. Electronically Signed   By: Marlaine Hind M.D.   On: 04/21/2019 20:07

## 2019-04-26 NOTE — Progress Notes (Signed)
Spoke w/ pt daughter, Glenard Haring while she was facetiming her mother. Updated her on  starting Solumedrol today (checking BG ACHS sliding scale insulin as needed) IV actemra x1 dose today Day 4/5 on Remdesivir.  Still having poor appetite. Has been sitting in chair since 845a 90-94% while resting in chair but can desat to low 80s while up to BSC/activity

## 2019-04-27 LAB — CBC WITH DIFFERENTIAL/PLATELET
Abs Immature Granulocytes: 0.05 10*3/uL (ref 0.00–0.07)
Basophils Absolute: 0 10*3/uL (ref 0.0–0.1)
Basophils Relative: 0 %
Eosinophils Absolute: 0 10*3/uL (ref 0.0–0.5)
Eosinophils Relative: 0 %
HCT: 42.6 % (ref 36.0–46.0)
Hemoglobin: 13.9 g/dL (ref 12.0–15.0)
Immature Granulocytes: 1 %
Lymphocytes Relative: 8 %
Lymphs Abs: 0.4 10*3/uL — ABNORMAL LOW (ref 0.7–4.0)
MCH: 29.5 pg (ref 26.0–34.0)
MCHC: 32.6 g/dL (ref 30.0–36.0)
MCV: 90.4 fL (ref 80.0–100.0)
Monocytes Absolute: 0.2 10*3/uL (ref 0.1–1.0)
Monocytes Relative: 3 %
Neutro Abs: 4.7 10*3/uL (ref 1.7–7.7)
Neutrophils Relative %: 88 %
Platelets: 384 10*3/uL (ref 150–400)
RBC: 4.71 MIL/uL (ref 3.87–5.11)
RDW: 13.2 % (ref 11.5–15.5)
WBC: 5.3 10*3/uL (ref 4.0–10.5)
nRBC: 0 % (ref 0.0–0.2)

## 2019-04-27 LAB — CULTURE, BLOOD (ROUTINE X 2)
Culture: NO GROWTH
Culture: NO GROWTH
Special Requests: ADEQUATE
Special Requests: ADEQUATE

## 2019-04-27 LAB — GLUCOSE, CAPILLARY
Glucose-Capillary: 130 mg/dL — ABNORMAL HIGH (ref 70–99)
Glucose-Capillary: 198 mg/dL — ABNORMAL HIGH (ref 70–99)
Glucose-Capillary: 186 mg/dL — ABNORMAL HIGH (ref 70–99)
Glucose-Capillary: 169 mg/dL — ABNORMAL HIGH (ref 70–99)

## 2019-04-27 LAB — COMPREHENSIVE METABOLIC PANEL
ALT: 65 U/L — ABNORMAL HIGH (ref 0–44)
AST: 22 U/L (ref 15–41)
Albumin: 3.2 g/dL — ABNORMAL LOW (ref 3.5–5.0)
Alkaline Phosphatase: 69 U/L (ref 38–126)
Anion gap: 11 (ref 5–15)
BUN: 15 mg/dL (ref 8–23)
CO2: 25 mmol/L (ref 22–32)
Calcium: 8.8 mg/dL — ABNORMAL LOW (ref 8.9–10.3)
Chloride: 103 mmol/L (ref 98–111)
Creatinine, Ser: 0.55 mg/dL (ref 0.44–1.00)
GFR calc Af Amer: >60 mL/min (ref >60)
GFR calc non Af Amer: >60 mL/min (ref >60)
Glucose, Bld: 137 mg/dL — ABNORMAL HIGH (ref 70–99)
Potassium: 4.6 mmol/L (ref 3.5–5.1)
Sodium: 139 mmol/L (ref 135–145)
Total Bilirubin: 0.7 mg/dL (ref 0.3–1.2)
Total Protein: 7 g/dL (ref 6.5–8.1)

## 2019-04-27 LAB — C-REACTIVE PROTEIN: CRP: 9.6 mg/dL — ABNORMAL HIGH (ref <1.0)

## 2019-04-27 LAB — BRAIN NATRIURETIC PEPTIDE: B Natriuretic Peptide: 93.4 pg/mL (ref 0.0–100.0)

## 2019-04-27 LAB — MAGNESIUM: Magnesium: 2.2 mg/dL (ref 1.7–2.4)

## 2019-04-27 LAB — D-DIMER, QUANTITATIVE: D-Dimer, Quant: 2.09 ug/mL-FEU — ABNORMAL HIGH (ref 0.00–0.50)

## 2019-04-27 MED ORDER — FUROSEMIDE 10 MG/ML IJ SOLN
40.0000 mg | Freq: Once | INTRAMUSCULAR | Status: AC
  Administered 2019-04-27: 40 mg via INTRAVENOUS
  Filled 2019-04-27: qty 4

## 2019-04-27 MED ORDER — METHYLPREDNISOLONE SODIUM SUCC 40 MG IJ SOLR
40.0000 mg | Freq: Two times a day (BID) | INTRAMUSCULAR | Status: DC
  Administered 2019-04-27 – 2019-04-28 (×3): 40 mg via INTRAVENOUS
  Filled 2019-04-27 (×3): qty 1

## 2019-04-27 NOTE — Progress Notes (Signed)
PROGRESS NOTE                                                                                                                                                                                                             Patient Demographics:    Brittany [name], is a 63 y.o. female, DOB - 1955/09/27, [id number]  Outpatient Primary MD for the patient is Caren Macadam, MD   Admit date - 04/21/2019   LOS - 5  Chief Complaint  Patient presents with   GI Bleeding   Cough       Brief Narrative: Patient is a 63 y.o. female with PMHx of depression, bipolar disorder, PTSD, GERD, anxiety, asthma who presented with shortness of breath x1 week-she was found to have acute hypoxic respiratory failure secondary to COVID-19 pneumonia.  Her review of systems was instantly positive for melena-without significant anemia.  See below for further details.   Subjective:   Patient in bed, appears comfortable, denies any headache, no fever, no chest pain or pressure, mildly improved shortness of breath , no abdominal pain. No focal weakness.   Assessment  & Plan :   Acute Hypoxic Resp Failure due to Covid 19 Viral pneumonia: She has been adequately treated with IV steroids, convalescent plasma and remdesivir, unfortunately she is still quite hypoxic 3 days after appropriate treatment, her CRP was falsely lowered now after continued IV steroid use CRP initially was 15, increased her steroid dose and gave her Actemra on 04/26/2019.  Clinically she feels better but still hypoxic, at night she required 15 L high flow nasal cannula oxygen.  I am afraid her pulmonary disease has already progressed quite a bit.  We will continue to monitor closely.  Hopefully Actemra will start reversing the inflammation in the next few days.   Actemra off label use - patient was told that if COVID-19 pneumonitis gets worse we might potentially use Actemra off label,  she denies any known history of tuberculosis or hepatitis, understands the risks and benefits and wants to proceed with Actemra treatment if required.  SpO2: 90 % O2 Flow Rate (L/min): 10 L/min  COVID-19 Labs:  Recent Labs    04/25/19 0651 04/26/19 0025 04/27/19 0445 04/27/19 0455  DDIMER 1.59* 1.66* 2.09*  --   FERRITIN 291  --   --   --  CRP 1.8* 2.6*  --  9.6*    Lab Results  Component Value Date   SARSCOV2NAA POSITIVE (A) 04/22/2019     COVID-19 Medications: Steroids: 11/4>> Remdesivir: 11/4>> Convalescent Plasma: X1 on 11/6  Actemra.  On 04/26/2019    ?  Melena: Incidentally found to have melena at the time of review of systems-last EGD October 2019 without acute findings-FOBT stools negative.  Hemoglobin stable-avoid NSAIDs-continue supportive care.  If reoccurs outpatient GI follow-up.  Bronchial asthma: Stable-continue bronchodilators  Bipolar disorder: Stable  Anxiety: Continue BuSpar and as needed Valium.  Obesity: BMI of 32.  Follow with PCP for weight loss.     Condition - Stable  Family Communication  :  Daughter updated over the phone 04/25/19, 04/27/2019 @ 10.35 am  Code Status :  Full Code  Diet :  Diet Order            Diet regular Room service appropriate? Yes; Fluid consistency: Thin  Diet effective now               Disposition Plan  :  Remain hospitalized  Barriers to discharge: Hypoxia requiring O2 supplementation/complete 5 days of IV Remdesivir  Consults  :  None  Procedures  :  None  GI prophylaxis: H2 Blocker  Antibiotics  :    Anti-infectives (From admission, onward)   Start     Dose/Rate Route Frequency Ordered Stop   04/23/19 1600  remdesivir 100 mg in sodium chloride 0.9 % 250 mL IVPB     100 mg 500 mL/hr over 30 Minutes Intravenous Every 24 hours 04/22/19 1727 04/26/19 1626   04/22/19 1830  remdesivir 200 mg in sodium chloride 0.9 % 250 mL IVPB     200 mg 500 mL/hr over 30 Minutes Intravenous Once 04/22/19 1727  04/22/19 2200     DVT Prophylaxis  :  Lovenox  Inpatient Medications  Scheduled Meds:  benzonatate  200 mg Oral TID   busPIRone  15 mg Oral TID   colestipol  1 g Oral BID   doxazosin  6 mg Oral QHS   enoxaparin (LOVENOX) injection  40 mg Subcutaneous Q24H   famotidine  20 mg Oral BID   fluticasone  2 spray Each Nare Daily   fluticasone  2 puff Inhalation BID   insulin aspart  0-5 Units Subcutaneous QHS   insulin aspart  0-9 Units Subcutaneous TID WC   methylPREDNISolone (SOLU-MEDROL) injection  60 mg Intravenous Q12H   mirtazapine  15 mg Oral QHS   montelukast  10 mg Oral QHS   vitamin C  500 mg Oral Daily   zinc sulfate  220 mg Oral Daily   Continuous Infusions:  PRN Meds:.acetaminophen, albuterol, diazepam, guaiFENesin-dextromethorphan, prochlorperazine   Time Spent in minutes  25  See all Orders from today for further details   Lala Lund M.D on 04/27/2019 at 10:37 AM  To page go to [email or web address] - use universal password  [facility] -  Office  [phone]    Objective:   Vitals:   04/27/19 0508 04/27/19 0509 04/27/19 0742 04/27/19 0800  BP:  118/69 127/69   Pulse:  86 61 61  Resp:  19 17 20   Temp:  98.2 F (36.8 C) 97.7 F (36.5 C)   TempSrc:  Oral Oral   SpO2: (!) 86% (!) 88% 93% 90%  Weight:      Height:        Wt Readings from Last 3 Encounters:  04/21/19 78.9 kg  01/22/19 80.7 kg  11/28/18 84.9 kg     Intake/Output Summary (Last 24 hours) at 04/27/2019 1037 Last data filed at 04/26/2019 2300 Gross per 24 hour  Intake 610 ml  Output --  Net 610 ml     Physical Exam  Awake Alert, Oriented X 3, No new F.N deficits, Normal affect [place].AT,PERRAL Supple Neck,No JVD, No cervical lymphadenopathy appriciated.  Symmetrical Chest wall movement, Good air movement bilaterally, few rales RRR,No Gallops, Rubs or new Murmurs, No Parasternal Heave +ve B.Sounds, Abd Soft, No tenderness, No organomegaly appriciated, No rebound  - guarding or rigidity. No Cyanosis, Clubbing or edema, No new Rash or bruise    Data Review:    CBC Recent Labs  Lab 04/23/19 0643 04/24/19 0549 04/25/19 0651 04/26/19 0025 04/27/19 0445  WBC 5.6 7.3 7.4 8.3 5.3  HGB 14.0 13.5 13.2 12.7 13.9  HCT 43.7 42.5 40.1 39.7 42.6  PLT 339 355 366 402* 384  MCV 92.4 92.2 90.1 90.4 90.4  MCH 29.6 29.3 29.7 28.9 29.5  MCHC 32.0 31.8 32.9 32.0 32.6  RDW 13.9 13.7 13.3 13.1 13.2  LYMPHSABS 0.5* 0.6* 0.7 0.7 0.4*  MONOABS 0.4 0.5 0.5 0.7 0.2  EOSABS 0.0 0.0 0.0 0.1 0.0  BASOSABS 0.0 0.0 0.0 0.0 0.0    Chemistries  Recent Labs  Lab 04/23/19 0643 04/24/19 0549 04/25/19 0651 04/26/19 0025 04/27/19 0445  NA 138 142 140 140 139  K 4.2 3.8 3.5 3.4* 4.6  CL 104 105 101 103 103  CO2 25 25 24 24 25   GLUCOSE 96 101* 95 94 137*  BUN 11 13 14 13 15   CREATININE 0.59 0.55 0.62 0.50 0.55  CALCIUM 8.8* 9.0 8.8* 8.4* 8.8*  MG 2.1  --   --  1.8 2.2  AST 43* 81* 62* 31 22  ALT 31 78* 114* 81* 65*  ALKPHOS 74 68 78 70 69  BILITOT 0.5 0.6 0.7 0.8 0.7   ------------------------------------------------------------------------------------------------------------------ No results for input(s): CHOL, HDL, LDLCALC, TRIG, CHOLHDL, LDLDIRECT in the last 72 hours.  Lab Results  Component Value Date   HGBA1C 4.9 11/28/2018   ------------------------------------------------------------------------------------------------------------------ No results for input(s): TSH, T4TOTAL, T3FREE, THYROIDAB in the last 72 hours.  Invalid input(s): FREET3 ------------------------------------------------------------------------------------------------------------------ Recent Labs    04/25/19 0651  FERRITIN 291    Coagulation profile No results for input(s): INR, PROTIME in the last 168 hours.  Recent Labs    04/26/19 0025 04/27/19 0445  DDIMER 1.66* 2.09*    Cardiac Enzymes No results for input(s): CKMB, TROPONINI, MYOGLOBIN in the last 168  hours.  Invalid input(s): CK ------------------------------------------------------------------------------------------------------------------    Component Value Date/Time   BNP 93.4 04/27/2019 0445    Micro Results Recent Results (from the past 240 hour(s))  SARS CORONAVIRUS 2 (TAT 6-24 HRS) Nasopharyngeal Nasopharyngeal Swab     Status: Abnormal   Collection Time: 04/22/19  2:46 AM   Specimen: Nasopharyngeal Swab  Result Value Ref Range Status   SARS Coronavirus 2 POSITIVE (A) NEGATIVE Final    Comment: RESULT CALLED TO, READ BACK BY AND VERIFIED WITH: Precious Reel RN 14:30 04/22/19 ([email or web address]) (NOTE) SARS-CoV-2 target nucleic acids are DETECTED. The SARS-CoV-2 RNA is generally detectable in upper and lower respiratory specimens during the acute phase of infection. Positive results are indicative of active infection with SARS-CoV-2. Clinical  correlation with patient history and other diagnostic information is necessary to determine patient infection status. Positive results do  not rule out bacterial infection or co-infection with other viruses. The  expected result is Negative. Fact Sheet for Patients: SugarRoll.be Fact Sheet for Healthcare Providers: https://www.woods-mathews.com/ This test is not yet approved or cleared by the [place] FDA and  has been authorized for detection and/or diagnosis of SARS-CoV-2 by FDA under an Emergency Use Authorization (EUA). This EUA will remain  in effect (meaning this test can be used) for  the duration of the COVID-19 declaration under Section 564(b)(1) of the Act, 21 U.S.C. section 360bbb-3(b)(1), unless the authorization is terminated or revoked sooner. Performed at [facility], [place] [address], [place], [place] [address]   Blood Culture (routine x 2)     Status: None   Collection Time: 04/22/19  3:30 AM   Specimen: BLOOD RIGHT HAND  Result Value Ref Range Status   Specimen  Description BLOOD RIGHT HAND  Final   Special Requests   Final    BOTTLES DRAWN AEROBIC AND ANAEROBIC Blood Culture adequate volume   Culture   Final    NO GROWTH 5 DAYS Performed at [facility], [place] [address] [place], [place] [address]    Report Status 04/27/2019 FINAL  Final  Blood Culture (routine x 2)     Status: None   Collection Time: 04/22/19  3:30 AM   Specimen: BLOOD  Result Value Ref Range Status   Specimen Description BLOOD RIGHT ANTECUBITAL  Final   Special Requests   Final    BOTTLES DRAWN AEROBIC AND ANAEROBIC Blood Culture adequate volume   Culture   Final    NO GROWTH 5 DAYS Performed at [facility], [place] [address] [place], [place] [address]    Report Status 04/27/2019 FINAL  Final    Radiology Reports Ct Angio Chest Pe W Or Wo Contrast  Result Date: 04/22/2019 CLINICAL DATA:  63 year old female with a history of shortness of breath and abnormal chest x-ray EXAM: CT ANGIOGRAPHY CHEST WITH CONTRAST TECHNIQUE: Multidetector CT imaging of the chest was performed using the standard protocol during bolus administration of intravenous contrast. Multiplanar CT image reconstructions and MIPs were obtained to evaluate the vascular anatomy. CONTRAST:  192mL OMNIPAQUE IOHEXOL 350 MG/ML SOLN COMPARISON:  04/10/2009, chest x-ray 04/21/2019, 06/21/2016 FINDINGS: Cardiovascular: Heart: No cardiomegaly. No pericardial fluid/thickening. Calcifications of the left anterior descending coronary artery. Aorta: Unremarkable course, caliber, contour of the thoracic aorta. No aneurysm or dissection flap. No periaortic fluid. Pulmonary arteries: No filling defects of the main pulmonary artery, lobar arteries, segmental or proximal subsegmental arteries. Mediastinum/Nodes: Small lymph nodes of the mediastinum. Unremarkable appearance of the thoracic esophagus. Unremarkable thoracic inlet. Lungs/Pleura: Pattern of peripheral ground-glass and linear opacities of the bilateral lungs,  more prominent on the left and predominantly in a peripheral distribution/subpleural distribution. There is a gradient towards the upper lungs. No pleural effusion or pneumothorax. Linear changes in the bilateral lung bases. No endotracheal or endobronchial debris. Upper Abdomen: No acute. Musculoskeletal: No acute displaced fracture. Degenerative changes of the spine. Review of the MIP images confirms the above findings. IMPRESSION: CT is negative for pulmonary emboli. There are a spectrum of findings in the lungs which are compatible with acute/subacute atypical infection (as well as other non-infectious etiologies), such as COVID-19 infection. Coronary artery disease. Electronically Signed   By: Corrie Mckusick D.O.   On: 04/22/2019 20:55   Dg Chest Port 1 View  Result Date: 04/26/2019 CLINICAL DATA:  Shortness of breath EXAM: PORTABLE CHEST 1 VIEW COMPARISON:  CTA chest dated 04/22/2019 FINDINGS: Multifocal patchy opacities left lung predominant, with  relative sparing of the right upper lobe. Subpleural/peripheral distribution. This appearance is compatible with multifocal pneumonia and may be mildly progressive priors. No pleural effusion or pneumothorax. The heart is normal in size. IMPRESSION: Multifocal pneumonia in this patient with known COVID, mildly progressive. Electronically Signed   By: [name] M.D.   On: 04/26/2019 10:10   Dg Chest Port 1 View  Result Date: 04/21/2019 CLINICAL DATA:  Shortness of breath and cough. Former smoker. EXAM: PORTABLE CHEST 1 VIEW COMPARISON:  06/21/2016 FINDINGS: Heart size is at the upper limits of normal. Asymmetric ill-defined areas of airspace opacity are seen in the left lung which are new, and suspicious for viral or other atypical pneumonia. No evidence of pleural effusion. IMPRESSION: New ill-defined areas of airspace opacity in left lung, suspicious for viral or other atypical pneumonia. Electronically Signed   By: Marlaine Hind M.D.   On: 04/21/2019  20:07

## 2019-04-27 NOTE — Progress Notes (Addendum)
   04/27/19 0106  Oxygen Therapy  SpO2 (!) 74 %   Patient  up to [place] assisted patient back to bed, bed placed in high fowlers  increasedoxygen to 5L patient's sats remain in the low high 70's-low 80's.  Placed HFNC  @ 10L  Patient's sats 88-91%. Will continue to monitor.

## 2019-04-27 NOTE — Progress Notes (Signed)
Patient dangling at to bedside or  BSC will desat to 70-80's with slow recovery to mid 80's via 8-10L HFNC.

## 2019-04-28 ENCOUNTER — Ambulatory Visit: Payer: PPO | Admitting: Gastroenterology

## 2019-04-28 LAB — CBC WITH DIFFERENTIAL/PLATELET
Abs Immature Granulocytes: 0.07 10*3/uL (ref 0.00–0.07)
Basophils Absolute: 0 10*3/uL (ref 0.0–0.1)
Basophils Relative: 0 %
Eosinophils Absolute: 0 10*3/uL (ref 0.0–0.5)
Eosinophils Relative: 0 %
HCT: 45.8 % (ref 36.0–46.0)
Hemoglobin: 14.9 g/dL (ref 12.0–15.0)
Immature Granulocytes: 1 %
Lymphocytes Relative: 5 %
Lymphs Abs: 0.4 10*3/uL — ABNORMAL LOW (ref 0.7–4.0)
MCH: 29.3 pg (ref 26.0–34.0)
MCHC: 32.5 g/dL (ref 30.0–36.0)
MCV: 90 fL (ref 80.0–100.0)
Monocytes Absolute: 0.3 10*3/uL (ref 0.1–1.0)
Monocytes Relative: 4 %
Neutro Abs: 7.8 10*3/uL — ABNORMAL HIGH (ref 1.7–7.7)
Neutrophils Relative %: 90 %
Platelets: 509 10*3/uL — ABNORMAL HIGH (ref 150–400)
RBC: 5.09 MIL/uL (ref 3.87–5.11)
RDW: 13.2 % (ref 11.5–15.5)
WBC: 8.7 10*3/uL (ref 4.0–10.5)
nRBC: 0 % (ref 0.0–0.2)

## 2019-04-28 LAB — COMPREHENSIVE METABOLIC PANEL
ALT: 103 U/L — ABNORMAL HIGH (ref 0–44)
AST: 75 U/L — ABNORMAL HIGH (ref 15–41)
Albumin: 3.3 g/dL — ABNORMAL LOW (ref 3.5–5.0)
Alkaline Phosphatase: 76 U/L (ref 38–126)
Anion gap: 14 (ref 5–15)
BUN: 20 mg/dL (ref 8–23)
CO2: 27 mmol/L (ref 22–32)
Calcium: 9.4 mg/dL (ref 8.9–10.3)
Chloride: 99 mmol/L (ref 98–111)
Creatinine, Ser: 0.62 mg/dL (ref 0.44–1.00)
GFR calc Af Amer: >60 mL/min (ref >60)
GFR calc non Af Amer: >60 mL/min (ref >60)
Glucose, Bld: 119 mg/dL — ABNORMAL HIGH (ref 70–99)
Potassium: 4.1 mmol/L (ref 3.5–5.1)
Sodium: 140 mmol/L (ref 135–145)
Total Bilirubin: 0.5 mg/dL (ref 0.3–1.2)
Total Protein: 7.3 g/dL (ref 6.5–8.1)

## 2019-04-28 LAB — GLUCOSE, CAPILLARY
Glucose-Capillary: 110 mg/dL — ABNORMAL HIGH (ref 70–99)
Glucose-Capillary: 95 mg/dL (ref 70–99)
Glucose-Capillary: 95 mg/dL (ref 70–99)
Glucose-Capillary: 112 mg/dL — ABNORMAL HIGH (ref 70–99)

## 2019-04-28 LAB — BRAIN NATRIURETIC PEPTIDE: B Natriuretic Peptide: 48.3 pg/mL (ref 0.0–100.0)

## 2019-04-28 LAB — C-REACTIVE PROTEIN: CRP: 4 mg/dL — ABNORMAL HIGH (ref <1.0)

## 2019-04-28 LAB — D-DIMER, QUANTITATIVE: D-Dimer, Quant: 1.9 ug/mL-FEU — ABNORMAL HIGH (ref 0.00–0.50)

## 2019-04-28 LAB — MAGNESIUM: Magnesium: 2.1 mg/dL (ref 1.7–2.4)

## 2019-04-28 MED ORDER — LORATADINE 10 MG PO TABS
10.0000 mg | ORAL_TABLET | Freq: Every day | ORAL | Status: DC
  Administered 2019-04-28 – 2019-05-02 (×5): 10 mg via ORAL
  Filled 2019-04-28 (×5): qty 1

## 2019-04-28 MED ORDER — FUROSEMIDE 10 MG/ML IJ SOLN
40.0000 mg | Freq: Once | INTRAMUSCULAR | Status: AC
  Administered 2019-04-28: 40 mg via INTRAVENOUS
  Filled 2019-04-28: qty 4

## 2019-04-28 NOTE — Progress Notes (Signed)
Physical Therapy Treatment Patient Details Name: Brittany [name] MRN: [id number] DOB: August 08, 1955 Today's Date: 04/28/2019    History of Present Illness 63 year old with a history of asthma, depression, bipolar disorder, PTSD, GERD, and anxiety who presented to the [facility] ED 04/21/19 with progressive shortness of breath of 1 weeks duration.In ED,  hypoxia od 88% on room air, fever, chest x-ray noted ill-defined areas of airspace opacity throughout the left lung. Positive for COVID-19.  Her Covid    PT Comments    The patient reports feeling a head ache, requiring more supplemental O2 since PT previous visit. Continue  Progressive mobility and weaning oxygen as able.   Follow Up Recommendations  Home health PT(may bene)     Equipment Recommendations  (TBD)    Recommendations for Other Services       Precautions / Restrictions Precautions Precaution Comments: monitor sats and HR, wean down  O2, is on much higher L today    Mobility  Bed Mobility Overal bed mobility: Modified Independent                Transfers Overall transfer level: Modified independent               General transfer comment: supports on bed rails and BSC  Ambulation/Gait             General Gait Details: unable today, reports increased SOB   Stairs             Wheelchair Mobility    Modified Rankin (Stroke Patients Only)       Balance                                            Cognition Arousal/Alertness: Awake/alert                                            Exercises      General Comments        Pertinent Vitals/Pain Faces Pain Scale: Hurts little more Pain Location: head, generalized, feeling "stuffy" Pain Descriptors / Indicators: Discomfort;Headache Pain Intervention(s): Monitored during session    Home Living                      Prior Function            PT Goals (current goals can now be found  in the care plan section)      Frequency    Min 3X/week      PT Plan Discharge plan needs to be updated    Co-evaluation              AM-PAC PT "6 Clicks" Mobility   Outcome Measure  Help needed turning from your back to your side while in a flat bed without using bedrails?: None Help needed moving from lying on your back to sitting on the side of a flat bed without using bedrails?: None Help needed moving to and from a bed to a chair (including a wheelchair)?: None Help needed standing up from a chair using your arms (e.g., wheelchair or bedside chair)?: None Help needed to walk in hospital room?: A Little Help needed climbing 3-5 steps with a railing? : A Lot 6 Click Score: 21  End of Session Equipment Utilized During Treatment: Oxygen Activity Tolerance: Treatment limited secondary to medical complications (Comment) Patient left: in bed;with call bell/phone within reach Nurse Communication: Mobility status PT Visit Diagnosis: Difficulty in walking, not elsewhere classified (R26.2)     Time: [id number] PT Time Calculation (min) (ACUTE ONLY): 20 min  Charges:  $Therapeutic Activity: 8-22 mins                     Tresa Endo PT Acute Rehabilitation Services  Office [phone]    [name] 04/28/2019, 4:22 PM

## 2019-04-28 NOTE — Progress Notes (Addendum)
Occupational Therapy Treatment Patient Details Name: Brittany [name] MRN: [id number] DOB: 15-Jul-1955 Today's Date: 04/28/2019    History of present illness 63 year old with a history of asthma, depression, bipolar disorder, PTSD, GERD, and anxiety who presented to the [facility] ED 04/21/19 with progressive shortness of breath of 1 weeks duration.In ED,  hypoxia od 88% on room air, fever, chest x-ray noted ill-defined areas of airspace opacity throughout the left lung. Positive for COVID-19.  Her Covid   OT comments  Pt making gradual progress towards OT goals, presents OOB on [facility] upon arrival to room. Pt continues to have limitations due to decreased endurance, increased DOE with minimal activity. Pt requiring rest breaks with performing peri-care post ADL task today. Pt initially on 4L HFNC, increased to 6L with additional activity as pt O2 sats sustaining in the mid 80s even with seated rest breaks. Trialled earlobe O2 probe vs finger probe and earlobe appears to read with higher saturation level, though difficult to maintain clear waveform - will continue to assess. Also noted elevated HR with activity (noted up to 130s with toileting ADL), returning to low 100s with seated rest. Further reviewed and educated pt re: energy conservation techniques during ADL tasks after return home. Pt asking appropriate questions and verbalizing understanding throughout. Pt on 5L HFNC end of session with SpO2 90%. Have updated d/c recommendations to include HHOT services after discharge given pt's progress/endurance levels. Will continue to follow acutely.    Follow Up Recommendations  Home health OT;Supervision/Assistance - 24 hour(24hr initially)    Equipment Recommendations  3 in 1 bedside commode          Precautions / Restrictions Precautions Precautions: Other (comment) Precaution Comments: monitor sats and HR, wean down  O2 Restrictions Weight Bearing Restrictions: No       Mobility Bed  Mobility Overal bed mobility: Modified Independent             General bed mobility comments: HOB elevated  Transfers Overall transfer level: Modified independent                    Balance                                           ADL either performed or assessed with clinical judgement   ADL Overall ADL's : Needs assistance/impaired     Grooming: Wash/dry hands;Set up;Sitting                   Toilet Transfer: Psychologist, counselling Details (indicate cue type and reason): pt seated on BSC upon arrival Toileting- Clothing Manipulation and Hygiene: Set up;Supervision/safety;Sit to/from stand Toileting - Clothing Manipulation Details (indicate cue type and reason): pt performing pericare after BM via lateral leans, completing clothing management (underwear and gown) all with supervision for safety; pt required increased time and rest breaks with pericare due to fatigue, elevated HR and decreased SpO2 with ADL completion    Tub/Shower Transfer Details (indicate cue type and reason): discussed use of 3:1 as shower seat for continued energy conservation during task completion Functional mobility during ADLs: Min guard General ADL Comments: pt continues to present with decreased activity tolerance and increased DOE with minimal activity; issued energy conservation handout and began review with pt      Vision       Perception     Praxis  Cognition Arousal/Alertness: Awake/alert Behavior During Therapy: WFL for tasks assessed/performed Overall Cognitive Status: Within Functional Limits for tasks assessed                                          Exercises Exercises: Other exercises Other Exercises Other Exercises: issued level 1 therband and handout, will benefit from further review/practice   Shoulder Instructions       General Comments      Pertinent Vitals/ Pain       Pain  Assessment: Faces Faces Pain Scale: Hurts little more Pain Location: head, generalized, feeling "stuffy" Pain Descriptors / Indicators: Discomfort;Headache Pain Intervention(s): Limited activity within patient's tolerance;Repositioned;Monitored during session  Home Living                                          Prior Functioning/Environment              Frequency  Min 3X/week        Progress Toward Goals  OT Goals(current goals can now be found in the care plan section)  Progress towards OT goals: Progressing toward goals  Acute Rehab OT Goals Patient Stated Goal: to get home to her cats  OT Goal Formulation: With patient Time For Goal Achievement: 05/08/19 Potential to Achieve Goals: Good ADL Goals Pt Will Perform Grooming: with modified independence;standing Pt Will Perform Lower Body Bathing: with modified independence;sit to/from stand Pt Will Perform Lower Body Dressing: with modified independence;sit to/from stand Pt Will Transfer to Toilet: with modified independence;ambulating Pt Will Perform Toileting - Clothing Manipulation and hygiene: with modified independence;sit to/from stand Pt Will Perform Tub/Shower Transfer: Tub transfer;with modified independence;ambulating;shower seat Additional ADL Goal #1: Pt will demonstrate improved activity tolerance in ability to maintain standing >39min during ADL task prior to requiring seated rest break.  Plan Frequency needs to be updated;Discharge plan needs to be updated    Co-evaluation                 AM-PAC OT "6 Clicks" Daily Activity     Outcome Measure   Help from another person eating meals?: None Help from another person taking care of personal grooming?: None Help from another person toileting, which includes using toliet, bedpan, or urinal?: None Help from another person bathing (including washing, rinsing, drying)?: A Little Help from another person to put on and taking off  regular upper body clothing?: None Help from another person to put on and taking off regular lower body clothing?: A Little 6 Click Score: 22    End of Session Equipment Utilized During Treatment: Oxygen  OT Visit Diagnosis: Muscle weakness (generalized) (M62.81);Other (comment)(decreased activity tolerance)   Activity Tolerance Patient tolerated treatment well;Patient limited by fatigue   Patient Left in bed;with call bell/phone within reach   Nurse Communication Mobility status        Time: 1000-1039 OT Time Calculation (min): 39 min  Charges: OT General Charges $OT Visit: 1 Visit OT Treatments $Self Care/Home Management : 38-52 mins  Lou Cal, OT Supplemental Rehabilitation Services Pager [phone] Office [phone]   Raymondo Band 04/28/2019, 12:03 PM

## 2019-04-28 NOTE — Progress Notes (Signed)
PROGRESS NOTE                                                                                                                                                                                                             Patient Demographics:    Brittany [name], is a 63 y.o. female, DOB - February 05, 1956, [id number]  Outpatient Primary MD for the patient is Caren Macadam, MD   Admit date - 04/21/2019   LOS - 6  Chief Complaint  Patient presents with   GI Bleeding   Cough       Brief Narrative: Patient is a 63 y.o. female with PMHx of depression, bipolar disorder, PTSD, GERD, anxiety, asthma who presented with shortness of breath x1 week-she was found to have acute hypoxic respiratory failure secondary to COVID-19 pneumonia.  Her review of systems was instantly positive for melena-without significant anemia.  See below for further details.   Subjective:   Patient in bed, appears comfortable, denies any headache, no fever, no chest pain or pressure, improved shortness of breath , no abdominal pain. No focal weakness.   Assessment  & Plan :   Acute Hypoxic Resp Failure due to Covid 19 Viral pneumonia: She has been adequately treated with IV steroids, convalescent plasma and remdesivir, unfortunately she is still quite hypoxic 3 days after appropriate treatment, her CRP was falsely lowered now after continued IV steroid use CRP initially was 15, increased her steroid dose and gave her Actemra on 04/26/2019.  Actemra she is now down to 6 L high flow nasal cannula oxygen on 04/28/2019 from 15 L high flow nasal cannula oxygen on 04/27/2019, also noted CRP trending down.  We will continue to monitor closely.       SpO2: 93 % O2 Flow Rate (L/min): 6 L/min  COVID-19 Labs:  Recent Labs    04/26/19 0025 04/27/19 0445 04/27/19 0455 04/28/19 0452  DDIMER 1.66* 2.09*  --  1.90*  CRP 2.6*  --  9.6* 4.0*    Lab Results    Component Value Date   SARSCOV2NAA POSITIVE (A) 04/22/2019     COVID-19 Medications: Steroids: 11/4>> Remdesivir: 11/4>> Convalescent Plasma: X1 on 11/6  Actemra.  On 04/26/2019    ?  Melena: Incidentally found to have melena at the time of review of systems-last EGD October 2019 without acute findings-FOBT  stools negative.  Hemoglobin stable-avoid NSAIDs-continue supportive care.  If reoccurs outpatient GI follow-up.  Bronchial asthma: Stable-continue bronchodilators  Bipolar disorder: Stable  Anxiety: Continue BuSpar and as needed Valium.  Obesity: BMI of 32.  Follow with PCP for weight loss.  Few Crackles.  Likely fluid overload.  Lasix on 04/28/2019.    Condition - Stable  Family Communication  :  Daughter updated over the phone 04/25/19, 04/27/2019 @ 10.35 am  Code Status :  Full Code  Diet :  Diet Order            Diet regular Room service appropriate? Yes; Fluid consistency: Thin  Diet effective now               Disposition Plan  :  Remain hospitalized  Barriers to discharge: Hypoxia requiring O2 supplementation/complete 5 days of IV Remdesivir  Consults  :  None  Procedures  :  None  GI prophylaxis: H2 Blocker  Antibiotics  :    Anti-infectives (From admission, onward)   Start     Dose/Rate Route Frequency Ordered Stop   04/23/19 1600  remdesivir 100 mg in sodium chloride 0.9 % 250 mL IVPB     100 mg 500 mL/hr over 30 Minutes Intravenous Every 24 hours 04/22/19 1727 04/26/19 1626   04/22/19 1830  remdesivir 200 mg in sodium chloride 0.9 % 250 mL IVPB     200 mg 500 mL/hr over 30 Minutes Intravenous Once 04/22/19 1727 04/22/19 2200     DVT Prophylaxis  :  Lovenox  Inpatient Medications  Scheduled Meds:  benzonatate  200 mg Oral TID   busPIRone  15 mg Oral TID   colestipol  1 g Oral BID   doxazosin  6 mg Oral QHS   enoxaparin (LOVENOX) injection  40 mg Subcutaneous Q24H   famotidine  20 mg Oral BID   fluticasone  2 spray Each Nare  Daily   fluticasone  2 puff Inhalation BID   insulin aspart  0-5 Units Subcutaneous QHS   insulin aspart  0-9 Units Subcutaneous TID WC   loratadine  10 mg Oral Daily   methylPREDNISolone (SOLU-MEDROL) injection  40 mg Intravenous Q12H   mirtazapine  15 mg Oral QHS   montelukast  10 mg Oral QHS   vitamin C  500 mg Oral Daily   zinc sulfate  220 mg Oral Daily   Continuous Infusions:  PRN Meds:.acetaminophen, albuterol, diazepam, guaiFENesin-dextromethorphan, prochlorperazine   Time Spent in minutes  25  See all Orders from today for further details   Lala Lund M.D on 04/28/2019 at 10:21 AM  To page go to [email or web address] - use universal password  [facility] -  Office  [phone]    Objective:   Vitals:   04/27/19 1906 04/28/19 0400 04/28/19 0426 04/28/19 0754  BP:   139/90 117/64  Pulse:  62  93  Resp:  20  (!) 21  Temp: 97.9 F (36.6 C)  97.9 F (36.6 C) 98.2 F (36.8 C)  TempSrc: Oral  Oral Oral  SpO2: (!) 88% 95% 92% 93%  Weight:      Height:        Wt Readings from Last 3 Encounters:  04/21/19 78.9 kg  01/22/19 80.7 kg  11/28/18 84.9 kg     Intake/Output Summary (Last 24 hours) at 04/28/2019 1021 Last data filed at 04/28/2019 0100 Gross per 24 hour  Intake 840 ml  Output 1400 ml  Net -560 ml  Physical Exam  Awake Alert, Oriented X 3, No new F.N deficits, Normal affect [place].AT,PERRAL Supple Neck,No JVD, No cervical lymphadenopathy appriciated.  Symmetrical Chest wall movement, Good air movement bilaterally, +ve rales RRR,No Gallops, Rubs or new Murmurs, No Parasternal Heave +ve B.Sounds, Abd Soft, No tenderness, No organomegaly appriciated, No rebound - guarding or rigidity. No Cyanosis, Clubbing or edema, No new Rash or bruise     Data Review:    CBC Recent Labs  Lab 04/24/19 0549 04/25/19 0651 04/26/19 0025 04/27/19 0445 04/28/19 0452  WBC 7.3 7.4 8.3 5.3 8.7  HGB 13.5 13.2 12.7 13.9 14.9  HCT 42.5 40.1  39.7 42.6 45.8  PLT 355 366 402* 384 509*  MCV 92.2 90.1 90.4 90.4 90.0  MCH 29.3 29.7 28.9 29.5 29.3  MCHC 31.8 32.9 32.0 32.6 32.5  RDW 13.7 13.3 13.1 13.2 13.2  LYMPHSABS 0.6* 0.7 0.7 0.4* 0.4*  MONOABS 0.5 0.5 0.7 0.2 0.3  EOSABS 0.0 0.0 0.1 0.0 0.0  BASOSABS 0.0 0.0 0.0 0.0 0.0    Chemistries  Recent Labs  Lab 04/23/19 0643 04/24/19 0549 04/25/19 0651 04/26/19 0025 04/27/19 0445 04/28/19 0452  NA 138 142 140 140 139 140  K 4.2 3.8 3.5 3.4* 4.6 4.1  CL 104 105 101 103 103 99  CO2 25 25 24 24 25 27   GLUCOSE 96 101* 95 94 137* 119*  BUN 11 13 14 13 15 20   CREATININE 0.59 0.55 0.62 0.50 0.55 0.62  CALCIUM 8.8* 9.0 8.8* 8.4* 8.8* 9.4  MG 2.1  --   --  1.8 2.2 2.1  AST 43* 81* 62* 31 22 75*  ALT 31 78* 114* 81* 65* 103*  ALKPHOS 74 68 78 70 69 76  BILITOT 0.5 0.6 0.7 0.8 0.7 0.5   ------------------------------------------------------------------------------------------------------------------ No results for input(s): CHOL, HDL, LDLCALC, TRIG, CHOLHDL, LDLDIRECT in the last 72 hours.  Lab Results  Component Value Date   HGBA1C 4.9 11/28/2018   ------------------------------------------------------------------------------------------------------------------ No results for input(s): TSH, T4TOTAL, T3FREE, THYROIDAB in the last 72 hours.  Invalid input(s): FREET3 ------------------------------------------------------------------------------------------------------------------ No results for input(s): VITAMINB12, FOLATE, FERRITIN, TIBC, IRON, RETICCTPCT in the last 72 hours.  Coagulation profile No results for input(s): INR, PROTIME in the last 168 hours.  Recent Labs    04/27/19 0445 04/28/19 0452  DDIMER 2.09* 1.90*    Cardiac Enzymes No results for input(s): CKMB, TROPONINI, MYOGLOBIN in the last 168 hours.  Invalid input(s): CK ------------------------------------------------------------------------------------------------------------------    Component  Value Date/Time   BNP 48.3 04/28/2019 0452    Micro Results Recent Results (from the past 240 hour(s))  SARS CORONAVIRUS 2 (TAT 6-24 HRS) Nasopharyngeal Nasopharyngeal Swab     Status: Abnormal   Collection Time: 04/22/19  2:46 AM   Specimen: Nasopharyngeal Swab  Result Value Ref Range Status   SARS Coronavirus 2 POSITIVE (A) NEGATIVE Final    Comment: RESULT CALLED TO, READ BACK BY AND VERIFIED WITH: Precious Reel RN 14:30 04/22/19 ([email or web address]) (NOTE) SARS-CoV-2 target nucleic acids are DETECTED. The SARS-CoV-2 RNA is generally detectable in upper and lower respiratory specimens during the acute phase of infection. Positive results are indicative of active infection with SARS-CoV-2. Clinical  correlation with patient history and other diagnostic information is necessary to determine patient infection status. Positive results do  not rule out bacterial infection or co-infection with other viruses. The expected result is Negative. Fact Sheet for Patients: SugarRoll.be Fact Sheet for Healthcare Providers: https://www.woods-mathews.com/ This test is not yet approved or cleared by the  [place] Architectural [employer] and  has been authorized for detection and/or diagnosis of SARS-CoV-2 by FDA under an Print production planner (EUA). This EUA will remain  in effect (meaning this test can be used) for  the duration of the COVID-19 declaration under Section 564(b)(1) of the Act, 21 U.S.C. section 360bbb-3(b)(1), unless the authorization is terminated or revoked sooner. Performed at [facility], [place] [address], [place], [place] [address]   Blood Culture (routine x 2)     Status: None   Collection Time: 04/22/19  3:30 AM   Specimen: BLOOD RIGHT HAND  Result Value Ref Range Status   Specimen Description BLOOD RIGHT HAND  Final   Special Requests   Final    BOTTLES DRAWN AEROBIC AND ANAEROBIC Blood Culture adequate volume   Culture   Final    NO GROWTH 5  DAYS Performed at [facility], [place] [address] [place], [place] [address]    Report Status 04/27/2019 FINAL  Final  Blood Culture (routine x 2)     Status: None   Collection Time: 04/22/19  3:30 AM   Specimen: BLOOD  Result Value Ref Range Status   Specimen Description BLOOD RIGHT ANTECUBITAL  Final   Special Requests   Final    BOTTLES DRAWN AEROBIC AND ANAEROBIC Blood Culture adequate volume   Culture   Final    NO GROWTH 5 DAYS Performed at [facility], [place] [address] [place], [place] [address]    Report Status 04/27/2019 FINAL  Final    Radiology Reports Ct Angio Chest Pe W Or Wo Contrast  Result Date: 04/22/2019 CLINICAL DATA:  63 year old female with a history of shortness of breath and abnormal chest x-ray EXAM: CT ANGIOGRAPHY CHEST WITH CONTRAST TECHNIQUE: Multidetector CT imaging of the chest was performed using the standard protocol during bolus administration of intravenous contrast. Multiplanar CT image reconstructions and MIPs were obtained to evaluate the vascular anatomy. CONTRAST:  126mL OMNIPAQUE IOHEXOL 350 MG/ML SOLN COMPARISON:  04/10/2009, chest x-ray 04/21/2019, 06/21/2016 FINDINGS: Cardiovascular: Heart: No cardiomegaly. No pericardial fluid/thickening. Calcifications of the left anterior descending coronary artery. Aorta: Unremarkable course, caliber, contour of the thoracic aorta. No aneurysm or dissection flap. No periaortic fluid. Pulmonary arteries: No filling defects of the main pulmonary artery, lobar arteries, segmental or proximal subsegmental arteries. Mediastinum/Nodes: Small lymph nodes of the mediastinum. Unremarkable appearance of the thoracic esophagus. Unremarkable thoracic inlet. Lungs/Pleura: Pattern of peripheral ground-glass and linear opacities of the bilateral lungs, more prominent on the left and predominantly in a peripheral distribution/subpleural distribution. There is a gradient towards the upper lungs. No pleural effusion or  pneumothorax. Linear changes in the bilateral lung bases. No endotracheal or endobronchial debris. Upper Abdomen: No acute. Musculoskeletal: No acute displaced fracture. Degenerative changes of the spine. Review of the MIP images confirms the above findings. IMPRESSION: CT is negative for pulmonary emboli. There are a spectrum of findings in the lungs which are compatible with acute/subacute atypical infection (as well as other non-infectious etiologies), such as COVID-19 infection. Coronary artery disease. Electronically Signed   By: Corrie Mckusick D.O.   On: 04/22/2019 20:55   Dg Chest Port 1 View  Result Date: 04/26/2019 CLINICAL DATA:  Shortness of breath EXAM: PORTABLE CHEST 1 VIEW COMPARISON:  CTA chest dated 04/22/2019 FINDINGS: Multifocal patchy opacities left lung predominant, with relative sparing of the right upper lobe. Subpleural/peripheral distribution. This appearance is compatible with multifocal pneumonia and may be mildly progressive priors. No pleural effusion  or pneumothorax. The heart is normal in size. IMPRESSION: Multifocal pneumonia in this patient with known COVID, mildly progressive. Electronically Signed   By: [name] M.D.   On: 04/26/2019 10:10   Dg Chest Port 1 View  Result Date: 04/21/2019 CLINICAL DATA:  Shortness of breath and cough. Former smoker. EXAM: PORTABLE CHEST 1 VIEW COMPARISON:  06/21/2016 FINDINGS: Heart size is at the upper limits of normal. Asymmetric ill-defined areas of airspace opacity are seen in the left lung which are new, and suspicious for viral or other atypical pneumonia. No evidence of pleural effusion. IMPRESSION: New ill-defined areas of airspace opacity in left lung, suspicious for viral or other atypical pneumonia. Electronically Signed   By: Marlaine Hind M.D.   On: 04/21/2019 20:07

## 2019-04-29 LAB — COMPREHENSIVE METABOLIC PANEL
ALT: 81 U/L — ABNORMAL HIGH (ref 0–44)
AST: 32 U/L (ref 15–41)
Albumin: 3 g/dL — ABNORMAL LOW (ref 3.5–5.0)
Alkaline Phosphatase: 68 U/L (ref 38–126)
Anion gap: 13 (ref 5–15)
BUN: 19 mg/dL (ref 8–23)
CO2: 28 mmol/L (ref 22–32)
Calcium: 8.6 mg/dL — ABNORMAL LOW (ref 8.9–10.3)
Chloride: 96 mmol/L — ABNORMAL LOW (ref 98–111)
Creatinine, Ser: 0.73 mg/dL (ref 0.44–1.00)
GFR calc Af Amer: >60 mL/min (ref >60)
GFR calc non Af Amer: >60 mL/min (ref >60)
Glucose, Bld: 101 mg/dL — ABNORMAL HIGH (ref 70–99)
Potassium: 4.4 mmol/L (ref 3.5–5.1)
Sodium: 137 mmol/L (ref 135–145)
Total Bilirubin: 0.5 mg/dL (ref 0.3–1.2)
Total Protein: 6.4 g/dL — ABNORMAL LOW (ref 6.5–8.1)

## 2019-04-29 LAB — D-DIMER, QUANTITATIVE: D-Dimer, Quant: 2.62 ug/mL-FEU — ABNORMAL HIGH (ref 0.00–0.50)

## 2019-04-29 LAB — CBC WITH DIFFERENTIAL/PLATELET
Abs Immature Granulocytes: 0.04 10*3/uL (ref 0.00–0.07)
Basophils Absolute: 0 10*3/uL (ref 0.0–0.1)
Basophils Relative: 0 %
Eosinophils Absolute: 0 10*3/uL (ref 0.0–0.5)
Eosinophils Relative: 1 %
HCT: 45.5 % (ref 36.0–46.0)
Hemoglobin: 14.6 g/dL (ref 12.0–15.0)
Immature Granulocytes: 1 %
Lymphocytes Relative: 5 %
Lymphs Abs: 0.3 10*3/uL — ABNORMAL LOW (ref 0.7–4.0)
MCH: 29.4 pg (ref 26.0–34.0)
MCHC: 32.1 g/dL (ref 30.0–36.0)
MCV: 91.5 fL (ref 80.0–100.0)
Monocytes Absolute: 0.2 10*3/uL (ref 0.1–1.0)
Monocytes Relative: 4 %
Neutro Abs: 4.2 10*3/uL (ref 1.7–7.7)
Neutrophils Relative %: 89 %
Platelets: 443 10*3/uL — ABNORMAL HIGH (ref 150–400)
RBC: 4.97 MIL/uL (ref 3.87–5.11)
RDW: 13.4 % (ref 11.5–15.5)
WBC: 4.7 10*3/uL (ref 4.0–10.5)
nRBC: 0 % (ref 0.0–0.2)

## 2019-04-29 LAB — MAGNESIUM: Magnesium: 2.1 mg/dL (ref 1.7–2.4)

## 2019-04-29 LAB — BRAIN NATRIURETIC PEPTIDE: B Natriuretic Peptide: 34.3 pg/mL (ref 0.0–100.0)

## 2019-04-29 LAB — GLUCOSE, CAPILLARY
Glucose-Capillary: 104 mg/dL — ABNORMAL HIGH (ref 70–99)
Glucose-Capillary: 119 mg/dL — ABNORMAL HIGH (ref 70–99)
Glucose-Capillary: 146 mg/dL — ABNORMAL HIGH (ref 70–99)
Glucose-Capillary: 148 mg/dL — ABNORMAL HIGH (ref 70–99)

## 2019-04-29 LAB — C-REACTIVE PROTEIN: CRP: 1.3 mg/dL — ABNORMAL HIGH (ref <1.0)

## 2019-04-29 MED ORDER — ENOXAPARIN SODIUM 40 MG/0.4ML ~~LOC~~ SOLN
40.0000 mg | Freq: Two times a day (BID) | SUBCUTANEOUS | Status: DC
  Administered 2019-04-29 – 2019-05-01 (×5): 40 mg via SUBCUTANEOUS
  Filled 2019-04-29 (×5): qty 0.4

## 2019-04-29 MED ORDER — METHYLPREDNISOLONE SODIUM SUCC 40 MG IJ SOLR
40.0000 mg | Freq: Every day | INTRAMUSCULAR | Status: DC
  Administered 2019-04-29 – 2019-05-02 (×4): 40 mg via INTRAVENOUS
  Filled 2019-04-29 (×4): qty 1

## 2019-04-29 MED ORDER — FUROSEMIDE 10 MG/ML IJ SOLN
40.0000 mg | Freq: Once | INTRAMUSCULAR | Status: AC
  Administered 2019-04-29: 40 mg via INTRAVENOUS
  Filled 2019-04-29: qty 4

## 2019-04-29 NOTE — Progress Notes (Signed)
PROGRESS NOTE                                                                                                                                                                                                             Patient Demographics:    Brittany [name], is a 63 y.o. female, DOB - May 04, 1956, [id number]  Outpatient Primary MD for the patient is Caren Macadam, MD   Admit date - 04/21/2019   LOS - 7  Chief Complaint  Patient presents with   GI Bleeding   Cough       Brief Narrative: Patient is a 63 y.o. female with PMHx of depression, bipolar disorder, PTSD, GERD, anxiety, asthma who presented with shortness of breath x1 week-she was found to have acute hypoxic respiratory failure secondary to COVID-19 pneumonia.  Her review of systems was instantly positive for melena-without significant anemia.  See below for further details.   Subjective:   Patient in bed, appears comfortable, denies any headache, no fever, no chest pain or pressure, no shortness of breath , no abdominal pain. No focal weakness.    Assessment  & Plan :   Acute Hypoxic Resp Failure due to Covid 19 Viral pneumonia: She has been adequately treated with IV steroids, convalescent plasma and remdesivir, unfortunately she is still quite hypoxic 3 days after appropriate treatment, her CRP was falsely lowered now after continued IV steroid use CRP initially was 15, increased her steroid dose and gave her Actemra on 04/26/2019.  Actemra she is now down to 6 L high flow nasal cannula oxygen on 04/28/2019 from 15 L high flow nasal cannula oxygen on 04/27/2019, also noted CRP trending down.  We will continue to monitor closely.      COVID-19 Labs:  Recent Labs    04/27/19 0445 04/27/19 0455 04/28/19 0452 04/29/19 0040  DDIMER 2.09*  --  1.90* 2.62*  CRP  --  9.6* 4.0* 1.3*    Lab Results  Component Value Date   SARSCOV2NAA POSITIVE (A)  04/22/2019     COVID-19 Medications: Steroids: 11/4>> Remdesivir: 11/4>> Convalescent Plasma: X1 on 11/6  Actemra.  On 04/26/2019    ?  Melena: Incidentally found to have melena at the time of review of systems-last EGD October 2019 without acute findings-FOBT stools negative.  Hemoglobin stable-avoid NSAIDs-continue supportive care.  If reoccurs  outpatient GI follow-up.  Bronchial asthma: Stable-continue bronchodilators  Bipolar disorder: Stable  Anxiety: Continue BuSpar and as needed Valium.  Obesity: BMI of 32.  Follow with PCP for weight loss.  Few Crackles.  Likely fluid overload.  Lasix repeat..    Condition - Stable  Family Communication  :  Daughter updated over the phone 04/25/19, 04/27/2019 @ 10.35 am  Code Status :  Full Code  Diet :  Diet Order            Diet regular Room service appropriate? Yes; Fluid consistency: Thin  Diet effective now               Disposition Plan  :  HHPT with DME and o2.  Barriers to discharge: Hypoxia requiring O2 supplementation/complete 5 days of IV Remdesivir  Consults  :  None  Procedures  :  None  GI prophylaxis: H2 Blocker  Antibiotics  :    Anti-infectives (From admission, onward)   Start     Dose/Rate Route Frequency Ordered Stop   04/23/19 1600  remdesivir 100 mg in sodium chloride 0.9 % 250 mL IVPB     100 mg 500 mL/hr over 30 Minutes Intravenous Every 24 hours 04/22/19 1727 04/26/19 1626   04/22/19 1830  remdesivir 200 mg in sodium chloride 0.9 % 250 mL IVPB     200 mg 500 mL/hr over 30 Minutes Intravenous Once 04/22/19 1727 04/22/19 2200     DVT Prophylaxis  :  Lovenox  Inpatient Medications  Scheduled Meds:  benzonatate  200 mg Oral TID   busPIRone  15 mg Oral TID   colestipol  1 g Oral BID   doxazosin  6 mg Oral QHS   enoxaparin (LOVENOX) injection  40 mg Subcutaneous Q12H   famotidine  20 mg Oral BID   fluticasone  2 spray Each Nare Daily   fluticasone  2 puff Inhalation BID    insulin aspart  0-5 Units Subcutaneous QHS   insulin aspart  0-9 Units Subcutaneous TID WC   loratadine  10 mg Oral Daily   methylPREDNISolone (SOLU-MEDROL) injection  40 mg Intravenous Daily   mirtazapine  15 mg Oral QHS   montelukast  10 mg Oral QHS   vitamin C  500 mg Oral Daily   zinc sulfate  220 mg Oral Daily   Continuous Infusions:  PRN Meds:.acetaminophen, albuterol, diazepam, guaiFENesin-dextromethorphan, prochlorperazine   Time Spent in minutes  25  See all Orders from today for further details   Lala Lund M.D on 04/29/2019 at 9:31 AM  To page go to [email or web address] - use universal password  [facility] -  Office  [phone]    Objective:   Vitals:   04/29/19 0345 04/29/19 0346 04/29/19 0347 04/29/19 0500  BP:      Pulse:    80  Resp:    (!) 22  Temp:      TempSrc:      SpO2: (!) 87% (!) 89% (!) 89% 94%  Weight:      Height:        Wt Readings from Last 3 Encounters:  04/21/19 78.9 kg  01/22/19 80.7 kg  11/28/18 84.9 kg     Intake/Output Summary (Last 24 hours) at 04/29/2019 0931 Last data filed at 04/29/2019 0500 Gross per 24 hour  Intake 840 ml  Output 300 ml  Net 540 ml     Physical Exam  Awake Alert, Oriented X 3, No new F.N deficits, Normal affect [place].AT,PERRAL Supple  Neck,No JVD, No cervical lymphadenopathy appriciated.  Symmetrical Chest wall movement, Good air movement bilaterally, mild rales RRR,No Gallops, Rubs or new Murmurs, No Parasternal Heave +ve B.Sounds, Abd Soft, No tenderness, No organomegaly appriciated, No rebound - guarding or rigidity. No Cyanosis, Clubbing or edema, No new Rash or bruise    Data Review:    CBC Recent Labs  Lab 04/25/19 0651 04/26/19 0025 04/27/19 0445 04/28/19 0452 04/29/19 0040  WBC 7.4 8.3 5.3 8.7 4.7  HGB 13.2 12.7 13.9 14.9 14.6  HCT 40.1 39.7 42.6 45.8 45.5  PLT 366 402* 384 509* 443*  MCV 90.1 90.4 90.4 90.0 91.5  MCH 29.7 28.9 29.5 29.3 29.4  MCHC 32.9 32.0  32.6 32.5 32.1  RDW 13.3 13.1 13.2 13.2 13.4  LYMPHSABS 0.7 0.7 0.4* 0.4* 0.3*  MONOABS 0.5 0.7 0.2 0.3 0.2  EOSABS 0.0 0.1 0.0 0.0 0.0  BASOSABS 0.0 0.0 0.0 0.0 0.0    Chemistries  Recent Labs  Lab 04/23/19 0643  04/25/19 0651 04/26/19 0025 04/27/19 0445 04/28/19 0452 04/29/19 0040  NA 138   < > 140 140 139 140 137  K 4.2   < > 3.5 3.4* 4.6 4.1 4.4  CL 104   < > 101 103 103 99 96*  CO2 25   < > 24 24 25 27 28   GLUCOSE 96   < > 95 94 137* 119* 101*  BUN 11   < > 14 13 15 20 19   CREATININE 0.59   < > 0.62 0.50 0.55 0.62 0.73  CALCIUM 8.8*   < > 8.8* 8.4* 8.8* 9.4 8.6*  MG 2.1  --   --  1.8 2.2 2.1 2.1  AST 43*   < > 62* 31 22 75* 32  ALT 31   < > 114* 81* 65* 103* 81*  ALKPHOS 74   < > 78 70 69 76 68  BILITOT 0.5   < > 0.7 0.8 0.7 0.5 0.5   < > = values in this interval not displayed.   ------------------------------------------------------------------------------------------------------------------ No results for input(s): CHOL, HDL, LDLCALC, TRIG, CHOLHDL, LDLDIRECT in the last 72 hours.  Lab Results  Component Value Date   HGBA1C 4.9 11/28/2018   ------------------------------------------------------------------------------------------------------------------ No results for input(s): TSH, T4TOTAL, T3FREE, THYROIDAB in the last 72 hours.  Invalid input(s): FREET3 ------------------------------------------------------------------------------------------------------------------ No results for input(s): VITAMINB12, FOLATE, FERRITIN, TIBC, IRON, RETICCTPCT in the last 72 hours.  Coagulation profile No results for input(s): INR, PROTIME in the last 168 hours.  Recent Labs    04/28/19 0452 04/29/19 0040  DDIMER 1.90* 2.62*    Cardiac Enzymes No results for input(s): CKMB, TROPONINI, MYOGLOBIN in the last 168 hours.  Invalid input(s): CK ------------------------------------------------------------------------------------------------------------------      Component Value Date/Time   BNP 34.3 04/29/2019 0040    Micro Results Recent Results (from the past 240 hour(s))  SARS CORONAVIRUS 2 (TAT 6-24 HRS) Nasopharyngeal Nasopharyngeal Swab     Status: Abnormal   Collection Time: 04/22/19  2:46 AM   Specimen: Nasopharyngeal Swab  Result Value Ref Range Status   SARS Coronavirus 2 POSITIVE (A) NEGATIVE Final    Comment: RESULT CALLED TO, READ BACK BY AND VERIFIED WITH: Precious Reel RN 14:30 04/22/19 ([email or web address]) (NOTE) SARS-CoV-2 target nucleic acids are DETECTED. The SARS-CoV-2 RNA is generally detectable in upper and lower respiratory specimens during the acute phase of infection. Positive results are indicative of active infection with SARS-CoV-2. Clinical  correlation with patient history and other diagnostic information is necessary  to determine patient infection status. Positive results do  not rule out bacterial infection or co-infection with other viruses. The expected result is Negative. Fact Sheet for Patients: SugarRoll.be Fact Sheet for Healthcare Providers: https://www.woods-mathews.com/ This test is not yet approved or cleared by the [place] FDA and  has been authorized for detection and/or diagnosis of SARS-CoV-2 by FDA under an Emergency Use Authorization (EUA). This EUA will remain  in effect (meaning this test can be used) for  the duration of the COVID-19 declaration under Section 564(b)(1) of the Act, 21 U.S.C. section 360bbb-3(b)(1), unless the authorization is terminated or revoked sooner. Performed at [facility], [place] [address] [place], [place] [address]   Blood Culture (routine x 2)     Status: None   Collection Time: 04/22/19  3:30 AM   Specimen: BLOOD RIGHT HAND  Result Value Ref Range Status   Specimen Description BLOOD RIGHT HAND  Final   Special Requests   Final    BOTTLES DRAWN AEROBIC AND ANAEROBIC Blood Culture adequate volume   Culture   Final     NO GROWTH 5 DAYS Performed at [facility], [place] [address] [place], [place] [address]    Report Status 04/27/2019 FINAL  Final  Blood Culture (routine x 2)     Status: None   Collection Time: 04/22/19  3:30 AM   Specimen: BLOOD  Result Value Ref Range Status   Specimen Description BLOOD RIGHT ANTECUBITAL  Final   Special Requests   Final    BOTTLES DRAWN AEROBIC AND ANAEROBIC Blood Culture adequate volume   Culture   Final    NO GROWTH 5 DAYS Performed at [facility], [place] [address], [place],  [address]    Report Status 04/27/2019 FINAL  Final    Radiology Reports Ct Angio Chest Pe W Or Wo Contrast  Result Date: 04/22/2019 CLINICAL DATA:  63 year old female with a history of shortness of breath and abnormal chest x-ray EXAM: CT ANGIOGRAPHY CHEST WITH CONTRAST TECHNIQUE: Multidetector CT imaging of the chest was performed using the standard protocol during bolus administration of intravenous contrast. Multiplanar CT image reconstructions and MIPs were obtained to evaluate the vascular anatomy. CONTRAST:  129mL OMNIPAQUE IOHEXOL 350 MG/ML SOLN COMPARISON:  04/10/2009, chest x-ray 04/21/2019, 06/21/2016 FINDINGS: Cardiovascular: Heart: No cardiomegaly. No pericardial fluid/thickening. Calcifications of the left anterior descending coronary artery. Aorta: Unremarkable course, caliber, contour of the thoracic aorta. No aneurysm or dissection flap. No periaortic fluid. Pulmonary arteries: No filling defects of the main pulmonary artery, lobar arteries, segmental or proximal subsegmental arteries. Mediastinum/Nodes: Small lymph nodes of the mediastinum. Unremarkable appearance of the thoracic esophagus. Unremarkable thoracic inlet. Lungs/Pleura: Pattern of peripheral ground-glass and linear opacities of the bilateral lungs, more prominent on the left and predominantly in a peripheral distribution/subpleural distribution. There is a gradient towards the upper lungs. No pleural  effusion or pneumothorax. Linear changes in the bilateral lung bases. No endotracheal or endobronchial debris. Upper Abdomen: No acute. Musculoskeletal: No acute displaced fracture. Degenerative changes of the spine. Review of the MIP images confirms the above findings. IMPRESSION: CT is negative for pulmonary emboli. There are a spectrum of findings in the lungs which are compatible with acute/subacute atypical infection (as well as other non-infectious etiologies), such as COVID-19 infection. Coronary artery disease. Electronically Signed   By: Corrie Mckusick D.O.   On: 04/22/2019 20:55   Dg Chest Port 1 View  Result Date: 04/26/2019 CLINICAL DATA:  Shortness of  breath EXAM: PORTABLE CHEST 1 VIEW COMPARISON:  CTA chest dated 04/22/2019 FINDINGS: Multifocal patchy opacities left lung predominant, with relative sparing of the right upper lobe. Subpleural/peripheral distribution. This appearance is compatible with multifocal pneumonia and may be mildly progressive priors. No pleural effusion or pneumothorax. The heart is normal in size. IMPRESSION: Multifocal pneumonia in this patient with known COVID, mildly progressive. Electronically Signed   By: [name] M.D.   On: 04/26/2019 10:10   Dg Chest Port 1 View  Result Date: 04/21/2019 CLINICAL DATA:  Shortness of breath and cough. Former smoker. EXAM: PORTABLE CHEST 1 VIEW COMPARISON:  06/21/2016 FINDINGS: Heart size is at the upper limits of normal. Asymmetric ill-defined areas of airspace opacity are seen in the left lung which are new, and suspicious for viral or other atypical pneumonia. No evidence of pleural effusion. IMPRESSION: New ill-defined areas of airspace opacity in left lung, suspicious for viral or other atypical pneumonia. Electronically Signed   By: Marlaine Hind M.D.   On: 04/21/2019 20:07

## 2019-04-29 NOTE — Progress Notes (Signed)
Physical Therapy Treatment Patient Details Name: Brittany [name] MRN: [id number] DOB: 03/25/1956 Today's Date: 04/29/2019    History of Present Illness 63 year old with a history of asthma, depression, bipolar disorder, PTSD, GERD, and anxiety who presented to the [facility] ED 04/21/19 with progressive shortness of breath of 1 weeks duration.In ED,  hypoxia od 88% on room air, fever, chest x-ray noted ill-defined areas of airspace opacity throughout the left lung. Positive for COVID-19.  Her Covid    PT Comments    The patient resting on 8 L HFNC (via finger) with SPO2 95%. Decreased to 6 L HFNC for mobilizing in room, wanted to straighten her counter which she performed for about 8 mins with 1 sit rest break, SPO2 remained >85%. Patient rested, SPO2 back to 95% via ear probe. Reduced O2 to 4 liters to ambulate to BR and back with /spO2 88%. HR increases to 120's with all activity. Encouraged flutter valve use after any exertion which helps raise SPO2. Instructed in proper use of the valve, and io use IS. Patient is progressing well today, reports feeling less anxious and less SOB tolerated decreased Oxygen.  Patient does express that she would like her antianxiety meds earlier in AM. And endorses that she has some in her pocketbook. RN notified of patient's comments.  Follow Up Recommendations  Home health PT     Equipment Recommendations    none   Recommendations for Other Services       Precautions / Restrictions Precautions Precaution Comments: monitor sats and HR, wean down  O2 to 4 L this visit, encourage flutter valve after toileting and moving around.    Mobility  Bed Mobility Overal bed mobility: Modified Independent                Transfers Overall transfer level: Modified independent                  Ambulation/Gait Ambulation/Gait assistance: Supervision Gait Distance (Feet): 20 Feet(x 2) Assistive device: None Gait Pattern/deviations: Step-through  pattern     General Gait Details: patient wanted to straighten her counters, did so for about 5 minutes on 6 L O2, SPO2 (finger) reading 88%. Patient hen ambulated to BR and back on 4 L with SPO2 88% then returned to 93with flutter valve use.   Stairs             Wheelchair Mobility    Modified Rankin (Stroke Patients Only)       Balance Overall balance assessment: No apparent balance deficits (not formally assessed)                                          Cognition Arousal/Alertness: Awake/alert Behavior During Therapy: WFL for tasks assessed/performed Overall Cognitive Status: Within Functional Limits for tasks assessed                                        Exercises      General Comments        Pertinent Vitals/Pain Faces Pain Scale: Hurts a little bit Pain Location: back/lungs from cough Pain Descriptors / Indicators: Discomfort Pain Intervention(s): Monitored during session    Home Living  Prior Function            PT Goals (current goals can now be found in the care plan section) Progress towards PT goals: Progressing toward goals    Frequency    Min 3X/week      PT Plan      Co-evaluation              AM-PAC PT "6 Clicks" Mobility   Outcome Measure  Help needed turning from your back to your side while in a flat bed without using bedrails?: None Help needed moving from lying on your back to sitting on the side of a flat bed without using bedrails?: None Help needed moving to and from a bed to a chair (including a wheelchair)?: None Help needed standing up from a chair using your arms (e.g., wheelchair or bedside chair)?: None Help needed to walk in hospital room?: A Little Help needed climbing 3-5 steps with a railing? : A Lot 6 Click Score: 21    End of Session Equipment Utilized During Treatment: Oxygen Activity Tolerance: Patient tolerated treatment  well Patient left: in chair;with call bell/phone within reach;with nursing/sitter in room Nurse Communication: Mobility status PT Visit Diagnosis: Difficulty in walking, not elsewhere classified (R26.2)     Time: [id number] PT Time Calculation (min) (ACUTE ONLY): 55 min  Charges:  $Gait Training: 23-37 mins $Self Care/Home Management: [place] Pager [phone] Office [phone]    [name] 04/29/2019, 5:58 PM

## 2019-04-30 LAB — GLUCOSE, CAPILLARY
Glucose-Capillary: 86 mg/dL (ref 70–99)
Glucose-Capillary: 109 mg/dL — ABNORMAL HIGH (ref 70–99)
Glucose-Capillary: 137 mg/dL — ABNORMAL HIGH (ref 70–99)
Glucose-Capillary: 127 mg/dL — ABNORMAL HIGH (ref 70–99)

## 2019-04-30 MED ORDER — LACTATED RINGERS IV SOLN
INTRAVENOUS | Status: AC
  Administered 2019-04-30: 13:00:00 via INTRAVENOUS

## 2019-04-30 NOTE — Progress Notes (Signed)
Notified daughter of progress all questions answered and this nurse's number shared for further communication.

## 2019-04-30 NOTE — Progress Notes (Addendum)
PROGRESS NOTE                                                                                                                                                                                                             Patient Demographics:    Brittany [name], is a 63 y.o. female, DOB - Oct 25, 1955, [id number]  Outpatient Primary MD for the patient is Caren Macadam, MD   Admit date - 04/21/2019   LOS - 8  Chief Complaint  Patient presents with   GI Bleeding   Cough       Brief Narrative: Patient is a 63 y.o. female with PMHx of depression, bipolar disorder, PTSD, GERD, anxiety, asthma who presented with shortness of breath x1 week-she was found to have acute hypoxic respiratory failure secondary to COVID-19 pneumonia.  Her review of systems was instantly positive for melena-without significant anemia.  See below for further details.   Subjective:   Patient in bed, appears comfortable, denies any headache, no fever, no chest pain or pressure, improved shortness of breath , no abdominal pain. No focal weakness.   Assessment  & Plan :   Acute Hypoxic Resp Failure due to Covid 19 Viral pneumonia: She has been adequately treated with IV steroids, convalescent plasma and remdesivir, unfortunately she is still quite hypoxic 3 days after appropriate treatment, her CRP was falsely lowered now after continued IV steroid use CRP initially was 15, increased her steroid dose and gave her Actemra on 04/26/2019.  Actemra she is now down to 3 L high flow nasal cannula oxygen on 04/28/2019 from 15 L high flow nasal cannula oxygen on 04/27/2019, also noted CRP trending down.  We will continue to monitor closely.     SpO2: 94 % O2 Flow Rate (L/min): 3 L/min   COVID-19 Labs:  Recent Labs    04/28/19 0452 04/29/19 0040  DDIMER 1.90* 2.62*  CRP 4.0* 1.3*    Lab Results  Component Value Date   SARSCOV2NAA POSITIVE (A)  04/22/2019     COVID-19 Medications: Steroids: 11/4>> Remdesivir: 11/4>> Convalescent Plasma: X1 on 11/6  Actemra.  On 04/26/2019    ?  Melena: Incidentally found to have melena at the time of review of systems-last EGD October 2019 without acute findings-FOBT stools negative.  Hemoglobin stable-avoid NSAIDs-continue supportive care.  If reoccurs outpatient GI follow-up.  Bronchial asthma: Stable-continue bronchodilators  Bipolar disorder: Stable  Anxiety: Continue BuSpar and as needed Valium.  Obesity: BMI of 32.  Follow with PCP for weight loss.  Few Crackles.  Resolved post lasix 2 days ago.  Mild Hypotension - gentle IVF x 8 hrs     Condition - Stable  Family Communication  :  Daughter updated over the phone 04/25/19, 04/27/2019 @ 10.35 am, 04/30/19  Code Status :  Full Code  Diet :  Diet Order            Diet regular Room service appropriate? Yes; Fluid consistency: Thin  Diet effective now               Disposition Plan  :  HHPT with DME and o2.  Barriers to discharge: Hypoxia requiring O2 supplementation/complete 5 days of IV Remdesivir  Consults  :  None  Procedures  :  None  GI prophylaxis: H2 Blocker  Antibiotics  :    Anti-infectives (From admission, onward)   Start     Dose/Rate Route Frequency Ordered Stop   04/23/19 1600  remdesivir 100 mg in sodium chloride 0.9 % 250 mL IVPB     100 mg 500 mL/hr over 30 Minutes Intravenous Every 24 hours 04/22/19 1727 04/26/19 1626   04/22/19 1830  remdesivir 200 mg in sodium chloride 0.9 % 250 mL IVPB     200 mg 500 mL/hr over 30 Minutes Intravenous Once 04/22/19 1727 04/22/19 2200     DVT Prophylaxis  :  Lovenox  Inpatient Medications  Scheduled Meds:  benzonatate  200 mg Oral TID   busPIRone  15 mg Oral TID   colestipol  1 g Oral BID   doxazosin  6 mg Oral QHS   enoxaparin (LOVENOX) injection  40 mg Subcutaneous Q12H   famotidine  20 mg Oral BID   fluticasone  2 spray Each Nare Daily     fluticasone  2 puff Inhalation BID   insulin aspart  0-5 Units Subcutaneous QHS   insulin aspart  0-9 Units Subcutaneous TID WC   loratadine  10 mg Oral Daily   methylPREDNISolone (SOLU-MEDROL) injection  40 mg Intravenous Daily   mirtazapine  15 mg Oral QHS   montelukast  10 mg Oral QHS   vitamin C  500 mg Oral Daily   zinc sulfate  220 mg Oral Daily   Continuous Infusions:  lactated ringers     PRN Meds:.acetaminophen, albuterol, diazepam, guaiFENesin-dextromethorphan, prochlorperazine   Time Spent in minutes  25  See all Orders from today for further details   Lala Lund M.D on 04/30/2019 at 9:12 AM  To page go to [email or web address] - use universal password  [facility] -  Office  [phone]    Objective:   Vitals:   04/29/19 1450 04/29/19 2000 04/30/19 0505 04/30/19 0745  BP: 107/67 109/81 103/65 (!) 83/67  Pulse: (!) 121 (!) 105  (!) 104  Resp: 20 20 16  (!) 22  Temp: 97.8 F (36.6 C) 97.6 F (36.4 C) 97.6 F (36.4 C) (!) 97.4 F (36.3 C)  TempSrc: Oral Oral  Oral  SpO2: 90% (!) 88% 94%   Weight:      Height:        Wt Readings from Last 3 Encounters:  04/21/19 78.9 kg  01/22/19 80.7 kg  11/28/18 84.9 kg     Intake/Output Summary (Last 24 hours) at 04/30/2019 0912 Last data filed at 04/30/2019 0600 Gross per 24 hour  Intake --  Output 700 ml  Net -700 ml     Physical Exam  Awake Alert,   No new F.N deficits, Normal affect .AT,PERRAL Supple Neck,No JVD, No cervical lymphadenopathy appriciated.  Symmetrical Chest wall movement, Good air movement bilaterally, CTAB RRR,No Gallops, Rubs or new Murmurs, No Parasternal Heave +ve B.Sounds, Abd Soft, No tenderness, No organomegaly appriciated, No rebound - guarding or rigidity. No Cyanosis, Clubbing or edema, No new Rash or bruise    Data Review:    CBC Recent Labs  Lab 04/25/19 0651 04/26/19 0025 04/27/19 0445 04/28/19 0452 04/29/19 0040  WBC 7.4 8.3 5.3 8.7 4.7   HGB 13.2 12.7 13.9 14.9 14.6  HCT 40.1 39.7 42.6 45.8 45.5  PLT 366 402* 384 509* 443*  MCV 90.1 90.4 90.4 90.0 91.5  MCH 29.7 28.9 29.5 29.3 29.4  MCHC 32.9 32.0 32.6 32.5 32.1  RDW 13.3 13.1 13.2 13.2 13.4  LYMPHSABS 0.7 0.7 0.4* 0.4* 0.3*  MONOABS 0.5 0.7 0.2 0.3 0.2  EOSABS 0.0 0.1 0.0 0.0 0.0  BASOSABS 0.0 0.0 0.0 0.0 0.0    Chemistries  Recent Labs  Lab 04/25/19 0651 04/26/19 0025 04/27/19 0445 04/28/19 0452 04/29/19 0040  NA 140 140 139 140 137  K 3.5 3.4* 4.6 4.1 4.4  CL 101 103 103 99 96*  CO2 24 24 25 27 28   GLUCOSE 95 94 137* 119* 101*  BUN 14 13 15 20 19   CREATININE 0.62 0.50 0.55 0.62 0.73  CALCIUM 8.8* 8.4* 8.8* 9.4 8.6*  MG  --  1.8 2.2 2.1 2.1  AST 62* 31 22 75* 32  ALT 114* 81* 65* 103* 81*  ALKPHOS 78 70 69 76 68  BILITOT 0.7 0.8 0.7 0.5 0.5   ------------------------------------------------------------------------------------------------------------------ No results for input(s): CHOL, HDL, LDLCALC, TRIG, CHOLHDL, LDLDIRECT in the last 72 hours.  Lab Results  Component Value Date   HGBA1C 4.9 11/28/2018   ------------------------------------------------------------------------------------------------------------------ No results for input(s): TSH, T4TOTAL, T3FREE, THYROIDAB in the last 72 hours.  Invalid input(s): FREET3 ------------------------------------------------------------------------------------------------------------------ No results for input(s): VITAMINB12, FOLATE, FERRITIN, TIBC, IRON, RETICCTPCT in the last 72 hours.  Coagulation profile No results for input(s): INR, PROTIME in the last 168 hours.  Recent Labs    04/28/19 0452 04/29/19 0040  DDIMER 1.90* 2.62*    Cardiac Enzymes No results for input(s): CKMB, TROPONINI, MYOGLOBIN in the last 168 hours.  Invalid input(s): CK ------------------------------------------------------------------------------------------------------------------    Component Value Date/Time    BNP 34.3 04/29/2019 0040    Micro Results Recent Results (from the past 240 hour(s))  SARS CORONAVIRUS 2 (TAT 6-24 HRS) Nasopharyngeal Nasopharyngeal Swab     Status: Abnormal   Collection Time: 04/22/19  2:46 AM   Specimen: Nasopharyngeal Swab  Result Value Ref Range Status   SARS Coronavirus 2 POSITIVE (A) NEGATIVE Final    Comment: RESULT CALLED TO, READ BACK BY AND VERIFIED WITH: Precious Reel RN 14:30 04/22/19 ([email or web address]) (NOTE) SARS-CoV-2 target nucleic acids are DETECTED. The SARS-CoV-2 RNA is generally detectable in upper and lower respiratory specimens during the acute phase of infection. Positive results are indicative of active infection with SARS-CoV-2. Clinical  correlation with patient history and other diagnostic information is necessary to determine patient infection status. Positive results do  not rule out bacterial infection or co-infection with other viruses. The expected result is Negative. Fact Sheet for Patients: SugarRoll.be Fact Sheet for Healthcare Providers: https://www.woods-mathews.com/ This test is not yet approved or cleared by the [place] FDA and  has been authorized for  detection and/or diagnosis of SARS-CoV-2 by FDA under an Emergency Use Authorization (EUA). This EUA will remain  in effect (meaning this test can be used) for  the duration of the COVID-19 declaration under Section 564(b)(1) of the Act, 21 U.S.C. section 360bbb-3(b)(1), unless the authorization is terminated or revoked sooner. Performed at [facility], [place] [address], [place], [place] [address]   Blood Culture (routine x 2)     Status: None   Collection Time: 04/22/19  3:30 AM   Specimen: BLOOD RIGHT HAND  Result Value Ref Range Status   Specimen Description BLOOD RIGHT HAND  Final   Special Requests   Final    BOTTLES DRAWN AEROBIC AND ANAEROBIC Blood Culture adequate volume   Culture   Final    NO GROWTH 5 DAYS Performed  at [facility], [place] [address], [place], [place] [address]    Report Status 04/27/2019 FINAL  Final  Blood Culture (routine x 2)     Status: None   Collection Time: 04/22/19  3:30 AM   Specimen: BLOOD  Result Value Ref Range Status   Specimen Description BLOOD RIGHT ANTECUBITAL  Final   Special Requests   Final    BOTTLES DRAWN AEROBIC AND ANAEROBIC Blood Culture adequate volume   Culture   Final    NO GROWTH 5 DAYS Performed at [facility], [place] [address], [place], [place] [address]    Report Status 04/27/2019 FINAL  Final    Radiology Reports Ct Angio Chest Pe W Or Wo Contrast  Result Date: 04/22/2019 CLINICAL DATA:  64 year old female with a history of shortness of breath and abnormal chest x-ray EXAM: CT ANGIOGRAPHY CHEST WITH CONTRAST TECHNIQUE: Multidetector CT imaging of the chest was performed using the standard protocol during bolus administration of intravenous contrast. Multiplanar CT image reconstructions and MIPs were obtained to evaluate the vascular anatomy. CONTRAST:  116mL OMNIPAQUE IOHEXOL 350 MG/ML SOLN COMPARISON:  04/10/2009, chest x-ray 04/21/2019, 06/21/2016 FINDINGS: Cardiovascular: Heart: No cardiomegaly. No pericardial fluid/thickening. Calcifications of the left anterior descending coronary artery. Aorta: Unremarkable course, caliber, contour of the thoracic aorta. No aneurysm or dissection flap. No periaortic fluid. Pulmonary arteries: No filling defects of the main pulmonary artery, lobar arteries, segmental or proximal subsegmental arteries. Mediastinum/Nodes: Small lymph nodes of the mediastinum. Unremarkable appearance of the thoracic esophagus. Unremarkable thoracic inlet. Lungs/Pleura: Pattern of peripheral ground-glass and linear opacities of the bilateral lungs, more prominent on the left and predominantly in a peripheral distribution/subpleural distribution. There is a gradient towards the upper lungs. No pleural effusion or pneumothorax. Linear  changes in the bilateral lung bases. No endotracheal or endobronchial debris. Upper Abdomen: No acute. Musculoskeletal: No acute displaced fracture. Degenerative changes of the spine. Review of the MIP images confirms the above findings. IMPRESSION: CT is negative for pulmonary emboli. There are a spectrum of findings in the lungs which are compatible with acute/subacute atypical infection (as well as other non-infectious etiologies), such as COVID-19 infection. Coronary artery disease. Electronically Signed   By: Corrie Mckusick D.O.   On: 04/22/2019 20:55   Dg Chest Port 1 View  Result Date: 04/26/2019 CLINICAL DATA:  Shortness of breath EXAM: PORTABLE CHEST 1 VIEW COMPARISON:  CTA chest dated 04/22/2019 FINDINGS: Multifocal patchy opacities left lung predominant, with relative sparing of the right upper lobe. Subpleural/peripheral distribution. This appearance is compatible with multifocal pneumonia and may be mildly progressive priors. No pleural effusion or pneumothorax. The heart is normal in size. IMPRESSION:  Multifocal pneumonia in this patient with known COVID, mildly progressive. Electronically Signed   By: [name] M.D.   On: 04/26/2019 10:10   Dg Chest Port 1 View  Result Date: 04/21/2019 CLINICAL DATA:  Shortness of breath and cough. Former smoker. EXAM: PORTABLE CHEST 1 VIEW COMPARISON:  06/21/2016 FINDINGS: Heart size is at the upper limits of normal. Asymmetric ill-defined areas of airspace opacity are seen in the left lung which are new, and suspicious for viral or other atypical pneumonia. No evidence of pleural effusion. IMPRESSION: New ill-defined areas of airspace opacity in left lung, suspicious for viral or other atypical pneumonia. Electronically Signed   By: Marlaine Hind M.D.   On: 04/21/2019 20:07

## 2019-05-01 LAB — MAGNESIUM: Magnesium: 2.1 mg/dL (ref 1.7–2.4)

## 2019-05-01 LAB — CBC WITH DIFFERENTIAL/PLATELET
Abs Immature Granulocytes: 0.04 10*3/uL (ref 0.00–0.07)
Basophils Absolute: 0 10*3/uL (ref 0.0–0.1)
Basophils Relative: 0 %
Eosinophils Absolute: 0.2 10*3/uL (ref 0.0–0.5)
Eosinophils Relative: 5 %
HCT: 45 % (ref 36.0–46.0)
Hemoglobin: 14.7 g/dL (ref 12.0–15.0)
Immature Granulocytes: 1 %
Lymphocytes Relative: 14 %
Lymphs Abs: 0.7 10*3/uL (ref 0.7–4.0)
MCH: 29.1 pg (ref 26.0–34.0)
MCHC: 32.7 g/dL (ref 30.0–36.0)
MCV: 88.9 fL (ref 80.0–100.0)
Monocytes Absolute: 0.4 10*3/uL (ref 0.1–1.0)
Monocytes Relative: 8 %
Neutro Abs: 3.6 10*3/uL (ref 1.7–7.7)
Neutrophils Relative %: 72 %
Platelets: 297 10*3/uL (ref 150–400)
RBC: 5.06 MIL/uL (ref 3.87–5.11)
RDW: 13.4 % (ref 11.5–15.5)
WBC: 5 10*3/uL (ref 4.0–10.5)
nRBC: 0 % (ref 0.0–0.2)

## 2019-05-01 LAB — COMPREHENSIVE METABOLIC PANEL
ALT: 42 U/L (ref 0–44)
AST: 23 U/L (ref 15–41)
Albumin: 3 g/dL — ABNORMAL LOW (ref 3.5–5.0)
Alkaline Phosphatase: 55 U/L (ref 38–126)
Anion gap: 13 (ref 5–15)
BUN: 13 mg/dL (ref 8–23)
CO2: 23 mmol/L (ref 22–32)
Calcium: 8.5 mg/dL — ABNORMAL LOW (ref 8.9–10.3)
Chloride: 98 mmol/L (ref 98–111)
Creatinine, Ser: 0.61 mg/dL (ref 0.44–1.00)
GFR calc Af Amer: >60 mL/min (ref >60)
GFR calc non Af Amer: >60 mL/min (ref >60)
Glucose, Bld: 83 mg/dL (ref 70–99)
Potassium: 3.6 mmol/L (ref 3.5–5.1)
Sodium: 134 mmol/L — ABNORMAL LOW (ref 135–145)
Total Bilirubin: 0.9 mg/dL (ref 0.3–1.2)
Total Protein: 5.7 g/dL — ABNORMAL LOW (ref 6.5–8.1)

## 2019-05-01 LAB — GLUCOSE, CAPILLARY
Glucose-Capillary: 88 mg/dL (ref 70–99)
Glucose-Capillary: 146 mg/dL — ABNORMAL HIGH (ref 70–99)
Glucose-Capillary: 115 mg/dL — ABNORMAL HIGH (ref 70–99)
Glucose-Capillary: 161 mg/dL — ABNORMAL HIGH (ref 70–99)

## 2019-05-01 LAB — BRAIN NATRIURETIC PEPTIDE: B Natriuretic Peptide: 22.3 pg/mL (ref 0.0–100.0)

## 2019-05-01 LAB — C-REACTIVE PROTEIN: CRP: <0.8 mg/dL (ref <1.0)

## 2019-05-01 LAB — D-DIMER, QUANTITATIVE: D-Dimer, Quant: 5.79 ug/mL-FEU — ABNORMAL HIGH (ref 0.00–0.50)

## 2019-05-01 MED ORDER — ENOXAPARIN SODIUM 60 MG/0.6ML ~~LOC~~ SOLN
60.0000 mg | Freq: Two times a day (BID) | SUBCUTANEOUS | Status: DC
  Administered 2019-05-01 – 2019-05-02 (×2): 60 mg via SUBCUTANEOUS
  Filled 2019-05-01 (×2): qty 0.6

## 2019-05-01 NOTE — Progress Notes (Signed)
Physical Therapy Treatment Patient Details Name: Brittany [name] MRN: [id number] DOB: 07-09-1955 Today's Date: 05/01/2019    History of Present Illness 63 year old with a history of asthma, depression, bipolar disorder, PTSD, GERD, and anxiety who presented to [facility] ED 04/21/19 with progressive shortness of breath of 1 weeks duration. In ED, SpO2 88% on room air, fever, CXR noted ill-defined areas of airspace opacity throughout left lung. Positive for COVID-19.   PT Comments    Pt progressing well with mobility. Trialled gait training with rollator allowing improved stability and energy conservation. Pt moving well, but continues to require frequent seated rest breaks due to decreased activity tolerance. Pt hopeful for d/c home soon, will have necessary support from family; continue to recommend HHPT services.  SpO2 down to 86% on RA with ambulation via finger probe Switched to ear probe, and SpO2 maintaining >/91% on RA with activity    Follow Up Recommendations  Home health PT     Equipment Recommendations  3in1 (PT);Other (comment)(rollator)    Recommendations for Other Services       Precautions / Restrictions Precautions Precautions: Fall Restrictions Weight Bearing Restrictions: No    Mobility  Bed Mobility               General bed mobility comments: Received sitting in recliner  Transfers Overall transfer level: Modified independent Equipment used: None;4-wheeled walker                Ambulation/Gait Ambulation/Gait assistance: Supervision Gait Distance (Feet): 60 Feet Assistive device: None;4-wheeled walker Gait Pattern/deviations: Step-through pattern;Decreased stride length;Wide base of support Gait velocity: Decreased   General Gait Details: Initial ambulation to/from bathroom without DME, pt reaching to wall/furniture for UE support. Additional gait trial with rollator, stability much improved and pt appreciates having seat for rest break  as needed. Educ on brakes/safety features   Stairs             Wheelchair Mobility    Modified Rankin (Stroke Patients Only)       Balance Overall balance assessment: Needs assistance   Sitting balance-Leahy Scale: Good       Standing balance-Leahy Scale: Good                              Cognition Arousal/Alertness: Awake/alert Behavior During Therapy: WFL for tasks assessed/performed Overall Cognitive Status: Within Functional Limits for tasks assessed                                        Exercises      General Comments General comments (skin integrity, edema, etc.): SpO2 down to 86% on RA with ambulation via finger probe; tried ear probe and SpO2 reading 91-93% on RA with activity. HR up to 124 standing at sink for ADL tasks      Pertinent Vitals/Pain Pain Assessment: Faces Faces Pain Scale: Hurts a little bit Pain Location: stomach Pain Descriptors / Indicators: Discomfort Pain Intervention(s): Monitored during session(pt requesting liquid diet for lunch - nurse secretary notified)    Home Living                      Prior Function            PT Goals (current goals can now be found in the care plan section) Progress towards PT goals: Progressing toward  goals    Frequency    Min 3X/week      PT Plan Equipment recommendations need to be updated    Co-evaluation              AM-PAC PT "6 Clicks" Mobility   Outcome Measure  Help needed turning from your back to your side while in a flat bed without using bedrails?: None Help needed moving from lying on your back to sitting on the side of a flat bed without using bedrails?: None Help needed moving to and from a bed to a chair (including a wheelchair)?: None Help needed standing up from a chair using your arms (e.g., wheelchair or bedside chair)?: None Help needed to walk in hospital room?: None Help needed climbing 3-5 steps with a railing? : A  Little 6 Click Score: 23    End of Session   Activity Tolerance: Patient tolerated treatment well Patient left: in chair;with call bell/phone within reach;with nursing/sitter in room Nurse Communication: Mobility status PT Visit Diagnosis: Difficulty in walking, not elsewhere classified (R26.2)     Time: [id number] PT Time Calculation (min) (ACUTE ONLY): 33 min  Charges:  $Gait Training: 8-22 mins $Therapeutic Activity: 8-22 mins                    Mabeline Caras, PT, DPT Acute Rehabilitation Services  Pager [phone] Office [place] 05/01/2019, 12:27 PM

## 2019-05-01 NOTE — Care Management (Addendum)
Benefit check with high priority sent to CMA, should result today. Will need 30 day coupon card at DC either called to filling pharmacy or picture of card sent to patient to present at pharmacy when picking up. Could not reach patient on room phone or cell phone. LVM requesting callback. Son's VM unidentified did not LVM.   Explained to patient that she will need PCP to make refills, as requested on consult.   Patient's cell [phone]  [facility]   [phone]

## 2019-05-01 NOTE — TOC Progression Note (Signed)
Transition of Care [facility]) - Progression Note    Patient Details  Name: Brittany [name] MRN: [id number] Date of Birth: 1956/03/14  [facility] CM/SW Contact  Ninfa Meeker, RN Phone Number: [phone] (working remotely) 05/01/2019, 3:19 PM  Clinical Narrative:  Case manager confirmed with patient which pharmacy she uses. Called Eliquis 30 day free card information into: [employer]  on [address]. RX [id number], [place], [id number], [id number]. Eliquis card printed to nursing unit, RN will give to patient.  Case manager is continuing to monitor for oxygen needs at discharge.         Expected Discharge Plan and Services                                                 Social Determinants of Health (SDOH) Interventions    Readmission Risk Interventions No flowsheet data found.

## 2019-05-01 NOTE — Progress Notes (Addendum)
PROGRESS NOTE                                                                                                                                                                                                             Patient Demographics:    Brittany [name], is a 63 y.o. female, DOB - September 04, 1955, [id number]  Outpatient Primary MD for the patient is Caren Macadam, MD   Admit date - 04/21/2019   LOS - 9  Chief Complaint  Patient presents with   GI Bleeding   Cough       Brief Narrative: Patient is a 63 y.o. female with PMHx of depression, bipolar disorder, PTSD, GERD, anxiety, asthma who presented with shortness of breath x1 week-she was found to have acute hypoxic respiratory failure secondary to COVID-19 pneumonia.  Her review of systems was instantly positive for melena-without significant anemia.  See below for further details.   Subjective:   Patient in bed, appears comfortable, denies any headache, no fever, no chest pain or pressure, no shortness of breath at rest , no abdominal pain. No focal weakness.   Assessment  & Plan :   Acute Hypoxic Resp Failure due to Covid 19 Viral pneumonia: She has been adequately treated with IV steroids, convalescent plasma and remdesivir, unfortunately she is still quite hypoxic 3 days after appropriate treatment, her CRP was falsely lowered now after continued IV steroid use CRP initially was 15, increased her steroid dose and gave her Actemra on 04/26/2019.  Actemra she is now down to 1-2 L nasal cannula oxygen on 04/28/2019 from 15 L high flow nasal cannula oxygen on 04/27/2019, also noted CRP trending down.  We will continue to monitor closely with advanced activity, if stable DC her home in am, may need o2.     SpO2: 93 % O2 Flow Rate (L/min): 1 L/min   COVID-19 Labs:  Recent Labs    04/29/19 0040 05/01/19 0155  DDIMER 2.62* 5.79*  CRP 1.3* <0.8    Lab Results    Component Value Date   SARSCOV2NAA POSITIVE (A) 04/22/2019     COVID-19 Medications: Steroids: 11/4>> Remdesivir: 11/4>> Convalescent Plasma: X1 on 11/6  Actemra.  On 04/26/2019    ?  Melena: Incidentally found to have melena at the time of review of systems-last EGD October 2019 without acute findings-FOBT  stools negative.  Hemoglobin stable-avoid NSAIDs-continue supportive care.  If reoccurs outpatient GI follow-up.  Bronchial asthma: Stable-continue bronchodilators  Bipolar disorder: Stable  Anxiety: Continue BuSpar and as needed Valium.  Obesity: BMI of 32.  Follow with PCP for weight loss.  Few Crackles.  Resolved post lasix 2 days ago.  Mild Hypotension - gentle IVF x 8 hrs  Rising D dimer - increased Lovenox, may need Eliquis, consulted CM for 1 mth coupon.   Condition - Stable  Family Communication  :  Daughter updated over the phone 04/25/19, 04/27/2019 @ 10.35 am, 04/30/19  Code Status :  Full Code  Diet :  Diet Order            Diet regular Room service appropriate? Yes; Fluid consistency: Thin  Diet effective now               Disposition Plan  :  HHPT with DME and o2.  Barriers to discharge: Hypoxia requiring O2 supplementation/complete 5 days of IV Remdesivir  Consults  :  None  Procedures  :  None  GI prophylaxis: H2 Blocker  Antibiotics  :    Anti-infectives (From admission, onward)   Start     Dose/Rate Route Frequency Ordered Stop   04/23/19 1600  remdesivir 100 mg in sodium chloride 0.9 % 250 mL IVPB     100 mg 500 mL/hr over 30 Minutes Intravenous Every 24 hours 04/22/19 1727 04/26/19 1626   04/22/19 1830  remdesivir 200 mg in sodium chloride 0.9 % 250 mL IVPB     200 mg 500 mL/hr over 30 Minutes Intravenous Once 04/22/19 1727 04/22/19 2200     DVT Prophylaxis  :  Lovenox  Inpatient Medications  Scheduled Meds:  benzonatate  200 mg Oral TID   busPIRone  15 mg Oral TID   colestipol  1 g Oral BID   doxazosin  6 mg Oral QHS    enoxaparin (LOVENOX) injection  40 mg Subcutaneous Q12H   famotidine  20 mg Oral BID   fluticasone  2 spray Each Nare Daily   fluticasone  2 puff Inhalation BID   insulin aspart  0-5 Units Subcutaneous QHS   insulin aspart  0-9 Units Subcutaneous TID WC   loratadine  10 mg Oral Daily   methylPREDNISolone (SOLU-MEDROL) injection  40 mg Intravenous Daily   mirtazapine  15 mg Oral QHS   montelukast  10 mg Oral QHS   vitamin C  500 mg Oral Daily   zinc sulfate  220 mg Oral Daily   Continuous Infusions:  PRN Meds:.acetaminophen, albuterol, diazepam, guaiFENesin-dextromethorphan, prochlorperazine   Time Spent in minutes  25  See all Orders from today for further details   Lala Lund M.D on 05/01/2019 at 9:04 AM  To page go to [email or web address] - use universal password  [facility] -  Office  [phone]    Objective:   Vitals:   04/30/19 1715 04/30/19 1930 04/30/19 2350 05/01/19 0530  BP: 112/75 116/72  104/65  Pulse:  (!) 115 99 (!) 104  Resp: 19  (!) 21 18  Temp: (!) 97.4 F (36.3 C) (!) 97.5 F (36.4 C)  97.7 F (36.5 C)  TempSrc: Oral     SpO2:  94% 96% 93%  Weight:      Height:        Wt Readings from Last 3 Encounters:  04/21/19 78.9 kg  01/22/19 80.7 kg  11/28/18 84.9 kg     Intake/Output Summary (Last 24  hours) at 05/01/2019 0904 Last data filed at 05/01/2019 0600 Gross per 24 hour  Intake --  Output 902 ml  Net -902 ml     Physical Exam  Awake Alert, No new F.N deficits, Normal affect [place].AT,PERRAL Supple Neck,No JVD, No cervical lymphadenopathy appriciated.  Symmetrical Chest wall movement, Good air movement bilaterally, CTAB RRR,No Gallops, Rubs or new Murmurs, No Parasternal Heave +ve B.Sounds, Abd Soft, No tenderness, No organomegaly appriciated, No rebound - guarding or rigidity. No Cyanosis, Clubbing or edema, No new Rash or bruise   Data Review:    CBC Recent Labs  Lab 04/26/19 0025 04/27/19 0445  04/28/19 0452 04/29/19 0040 05/01/19 0155  WBC 8.3 5.3 8.7 4.7 5.0  HGB 12.7 13.9 14.9 14.6 14.7  HCT 39.7 42.6 45.8 45.5 45.0  PLT 402* 384 509* 443* 297  MCV 90.4 90.4 90.0 91.5 88.9  MCH 28.9 29.5 29.3 29.4 29.1  MCHC 32.0 32.6 32.5 32.1 32.7  RDW 13.1 13.2 13.2 13.4 13.4  LYMPHSABS 0.7 0.4* 0.4* 0.3* 0.7  MONOABS 0.7 0.2 0.3 0.2 0.4  EOSABS 0.1 0.0 0.0 0.0 0.2  BASOSABS 0.0 0.0 0.0 0.0 0.0    Chemistries  Recent Labs  Lab 04/26/19 0025 04/27/19 0445 04/28/19 0452 04/29/19 0040 05/01/19 0155  NA 140 139 140 137 134*  K 3.4* 4.6 4.1 4.4 3.6  CL 103 103 99 96* 98  CO2 24 25 27 28 23   GLUCOSE 94 137* 119* 101* 83  BUN 13 15 20 19 13   CREATININE 0.50 0.55 0.62 0.73 0.61  CALCIUM 8.4* 8.8* 9.4 8.6* 8.5*  MG 1.8 2.2 2.1 2.1 2.1  AST 31 22 75* 32 23  ALT 81* 65* 103* 81* 42  ALKPHOS 70 69 76 68 55  BILITOT 0.8 0.7 0.5 0.5 0.9   ------------------------------------------------------------------------------------------------------------------ No results for input(s): CHOL, HDL, LDLCALC, TRIG, CHOLHDL, LDLDIRECT in the last 72 hours.  Lab Results  Component Value Date   HGBA1C 4.9 11/28/2018   ------------------------------------------------------------------------------------------------------------------ No results for input(s): TSH, T4TOTAL, T3FREE, THYROIDAB in the last 72 hours.  Invalid input(s): FREET3 ------------------------------------------------------------------------------------------------------------------ No results for input(s): VITAMINB12, FOLATE, FERRITIN, TIBC, IRON, RETICCTPCT in the last 72 hours.  Coagulation profile No results for input(s): INR, PROTIME in the last 168 hours.  Recent Labs    04/29/19 0040 05/01/19 0155  DDIMER 2.62* 5.79*    Cardiac Enzymes No results for input(s): CKMB, TROPONINI, MYOGLOBIN in the last 168 hours.  Invalid input(s):  CK ------------------------------------------------------------------------------------------------------------------    Component Value Date/Time   BNP 22.3 05/01/2019 0155    Micro Results Recent Results (from the past 240 hour(s))  SARS CORONAVIRUS 2 (TAT 6-24 HRS) Nasopharyngeal Nasopharyngeal Swab     Status: Abnormal   Collection Time: 04/22/19  2:46 AM   Specimen: Nasopharyngeal Swab  Result Value Ref Range Status   SARS Coronavirus 2 POSITIVE (A) NEGATIVE Final    Comment: RESULT CALLED TO, READ BACK BY AND VERIFIED WITH: Precious Reel RN 14:30 04/22/19 ([email or web address]) (NOTE) SARS-CoV-2 target nucleic acids are DETECTED. The SARS-CoV-2 RNA is generally detectable in upper and lower respiratory specimens during the acute phase of infection. Positive results are indicative of active infection with SARS-CoV-2. Clinical  correlation with patient history and other diagnostic information is necessary to determine patient infection status. Positive results do  not rule out bacterial infection or co-infection with other viruses. The expected result is Negative. Fact Sheet for Patients: SugarRoll.be Fact Sheet for Healthcare Providers: https://www.woods-mathews.com/ This test is not yet approved  or cleared by the [place] and  has been authorized for detection and/or diagnosis of SARS-CoV-2 by FDA under an Emergency Use Authorization (EUA). This EUA will remain  in effect (meaning this test can be used) for  the duration of the COVID-19 declaration under Section 564(b)(1) of the Act, 21 U.S.C. section 360bbb-3(b)(1), unless the authorization is terminated or revoked sooner. Performed at [facility], [place] [address], [place], [place] [address]   Blood Culture (routine x 2)     Status: None   Collection Time: 04/22/19  3:30 AM   Specimen: BLOOD RIGHT HAND  Result Value Ref Range Status   Specimen Description BLOOD RIGHT HAND  Final    Special Requests   Final    BOTTLES DRAWN AEROBIC AND ANAEROBIC Blood Culture adequate volume   Culture   Final    NO GROWTH 5 DAYS Performed at [facility], [place] [address], [place], [place] [address]    Report Status 04/27/2019 FINAL  Final  Blood Culture (routine x 2)     Status: None   Collection Time: 04/22/19  3:30 AM   Specimen: BLOOD  Result Value Ref Range Status   Specimen Description BLOOD RIGHT ANTECUBITAL  Final   Special Requests   Final    BOTTLES DRAWN AEROBIC AND ANAEROBIC Blood Culture adequate volume   Culture   Final    NO GROWTH 5 DAYS Performed at [facility], [place] [address] [place], [place] [address]    Report Status 04/27/2019 FINAL  Final    Radiology Reports Ct Angio Chest Pe W Or Wo Contrast  Result Date: 04/22/2019 CLINICAL DATA:  63 year old female with a history of shortness of breath and abnormal chest x-ray EXAM: CT ANGIOGRAPHY CHEST WITH CONTRAST TECHNIQUE: Multidetector CT imaging of the chest was performed using the standard protocol during bolus administration of intravenous contrast. Multiplanar CT image reconstructions and MIPs were obtained to evaluate the vascular anatomy. CONTRAST:  14mL OMNIPAQUE IOHEXOL 350 MG/ML SOLN COMPARISON:  04/10/2009, chest x-ray 04/21/2019, 06/21/2016 FINDINGS: Cardiovascular: Heart: No cardiomegaly. No pericardial fluid/thickening. Calcifications of the left anterior descending coronary artery. Aorta: Unremarkable course, caliber, contour of the thoracic aorta. No aneurysm or dissection flap. No periaortic fluid. Pulmonary arteries: No filling defects of the main pulmonary artery, lobar arteries, segmental or proximal subsegmental arteries. Mediastinum/Nodes: Small lymph nodes of the mediastinum. Unremarkable appearance of the thoracic esophagus. Unremarkable thoracic inlet. Lungs/Pleura: Pattern of peripheral ground-glass and linear opacities of the bilateral lungs, more prominent on the left and  predominantly in a peripheral distribution/subpleural distribution. There is a gradient towards the upper lungs. No pleural effusion or pneumothorax. Linear changes in the bilateral lung bases. No endotracheal or endobronchial debris. Upper Abdomen: No acute. Musculoskeletal: No acute displaced fracture. Degenerative changes of the spine. Review of the MIP images confirms the above findings. IMPRESSION: CT is negative for pulmonary emboli. There are a spectrum of findings in the lungs which are compatible with acute/subacute atypical infection (as well as other non-infectious etiologies), such as COVID-19 infection. Coronary artery disease. Electronically Signed   By: Corrie Mckusick D.O.   On: 04/22/2019 20:55   Dg Chest Port 1 View  Result Date: 04/26/2019 CLINICAL DATA:  Shortness of breath EXAM: PORTABLE CHEST 1 VIEW COMPARISON:  CTA chest dated 04/22/2019 FINDINGS: Multifocal patchy opacities left lung predominant, with relative sparing of the right upper lobe. Subpleural/peripheral distribution. This appearance is compatible with multifocal pneumonia and may be mildly progressive  priors. No pleural effusion or pneumothorax. The heart is normal in size. IMPRESSION: Multifocal pneumonia in this patient with known COVID, mildly progressive. Electronically Signed   By: [name] M.D.   On: 04/26/2019 10:10   Dg Chest Port 1 View  Result Date: 04/21/2019 CLINICAL DATA:  Shortness of breath and cough. Former smoker. EXAM: PORTABLE CHEST 1 VIEW COMPARISON:  06/21/2016 FINDINGS: Heart size is at the upper limits of normal. Asymmetric ill-defined areas of airspace opacity are seen in the left lung which are new, and suspicious for viral or other atypical pneumonia. No evidence of pleural effusion. IMPRESSION: New ill-defined areas of airspace opacity in left lung, suspicious for viral or other atypical pneumonia. Electronically Signed   By: Marlaine Hind M.D.   On: 04/21/2019 20:07

## 2019-05-01 NOTE — TOC Benefit Eligibility Note (Signed)
Transition of Care [facility]) Benefit Eligibility Note    Patient Details  Name: Vanette Olsson MRN: [id number] Date of Birth: 1956-02-01   Medication/Dose: Alveda Reasons and Eliquis     Tier: 3 Drug  Prescription Coverage Preferred Pharmacy: Any retail pharmacy  Spoke with Person/Company/Phone Number:: [facility] [phone]  Co-Pay: $45 for 30 day retail and mail order/ $90 for 90 day retail and mail order  Prior Approval: No          Delorse Lek Phone Number: 05/01/2019, 2:51 PM

## 2019-05-02 LAB — GLUCOSE, CAPILLARY
Glucose-Capillary: 90 mg/dL (ref 70–99)
Glucose-Capillary: 83 mg/dL (ref 70–99)

## 2019-05-02 LAB — CBC WITH DIFFERENTIAL/PLATELET
Abs Immature Granulocytes: 0.11 10*3/uL — ABNORMAL HIGH (ref 0.00–0.07)
Basophils Absolute: 0 10*3/uL (ref 0.0–0.1)
Basophils Relative: 0 %
Eosinophils Absolute: 0.1 10*3/uL (ref 0.0–0.5)
Eosinophils Relative: 2 %
HCT: 41.2 % (ref 36.0–46.0)
Hemoglobin: 13.4 g/dL (ref 12.0–15.0)
Immature Granulocytes: 2 %
Lymphocytes Relative: 9 %
Lymphs Abs: 0.5 10*3/uL — ABNORMAL LOW (ref 0.7–4.0)
MCH: 29.2 pg (ref 26.0–34.0)
MCHC: 32.5 g/dL (ref 30.0–36.0)
MCV: 89.8 fL (ref 80.0–100.0)
Monocytes Absolute: 0.5 10*3/uL (ref 0.1–1.0)
Monocytes Relative: 9 %
Neutro Abs: 4.4 10*3/uL (ref 1.7–7.7)
Neutrophils Relative %: 78 %
Platelets: 315 10*3/uL (ref 150–400)
RBC: 4.59 MIL/uL (ref 3.87–5.11)
RDW: 13.2 % (ref 11.5–15.5)
WBC: 5.7 10*3/uL (ref 4.0–10.5)
nRBC: 0 % (ref 0.0–0.2)

## 2019-05-02 LAB — D-DIMER, QUANTITATIVE: D-Dimer, Quant: 2.72 ug/mL-FEU — ABNORMAL HIGH (ref 0.00–0.50)

## 2019-05-02 MED ORDER — METHYLPREDNISOLONE 4 MG PO TBPK: ORAL_TABLET | ORAL | 0 refills | Status: DC

## 2019-05-02 MED ORDER — APIXABAN 2.5 MG PO TABS: 2.5000 mg | ORAL_TABLET | Freq: Two times a day (BID) | ORAL | 0 refills | Status: DC

## 2019-05-02 NOTE — Discharge Summary (Signed)
Brittany [name] [id number] DOB: Nov 21, 1955 DOA: 04/21/2019  PCP: Caren Macadam, MD  Admit date: 04/21/2019  Discharge date: 05/02/2019  Admitted From: Home   Disposition:  Home   Recommendations for Outpatient Follow-up:   Follow up with PCP in 1-2 weeks  PCP Please obtain BMP/CBC, 2 view CXR in 1week,  (see Discharge instructions)   PCP Please follow up on the following pending results: Monitor D dimer and Hypoxia   Home Health: RN, PT  Equipment/Devices: o2, walker  Consultations: None  Discharge Condition: Stable    CODE STATUS: Full    Diet Recommendation: Heart Healthy      Chief Complaint  Patient presents with   GI Bleeding   Cough     Brief history of present illness from the day of admission and additional interim summary    Patient is a 63 y.o. female with PMHx of depression, bipolar disorder, PTSD, GERD, anxiety, asthma who presented with shortness of breath x1 week-she was found to have acute hypoxic respiratory failure secondary to COVID-19 pneumonia.  Her review of systems was instantly positive for melena-without significant anemia.  See below for further details.                                                                 Hospital Course    Acute Hypoxic Resp Failure due to Covid 19 Viral pneumonia: She has been adequately treated with IV steroids, convalescent plasma and remdesivir, unfortunately she is still quite hypoxic 3 days after appropriate treatment, her CRP was falsely lowered now after continued IV steroid use CRP initially was 15, increased her steroid dose and gave her Actemra on 04/26/2019.  Actemra she is now down to 1-2 L nasal cannula oxygen on 04/28/2019 from 15 L high flow nasal cannula oxygen on 04/27/2019, also noted CRP trending down.    Feels a whole  lot better now on 1 to 2 L nasal cannula oxygen and eager to go home, she will be discharged on home oxygen along with oral steroid taper.  Since her D-dimer is still elevated she will get 2 weeks of Eliquis at prophylactic dose.  PCP to monitor.  Health PT and RN also ordered.   COVID-19 Medications: Steroids: 11/4>> Remdesivir: 11/4>> Convalescent Plasma: X1 on 11/6  Actemra.  On 04/26/2019    COVID-19 Labs  Recent Labs    05/01/19 0155 05/02/19 0252  DDIMER 5.79* 2.72*  CRP <0.8  --     Lab Results  Component Value Date   SARSCOV2NAA POSITIVE (A) 04/22/2019    Bronchial asthma: Stable-continue bronchodilators  Bipolar disorder: Stable  Anxiety: Continue BuSpar and as needed Valium.  Obesity: BMI of 32.  Follow with PCP for weight loss.  Few Crackles.  Resolved post lasix 2 days ago.  Mild  Hypotension - gentle IVF x 8 hrs  Rising D dimer -was given full dose Lovenox for a few days, D-dimer trend has come down 2 weeks of prophylactic Eliquis.  1 month coupon provided.  PCP to monitor.   Discharge diagnosis     Principal Problem:   Suspected COVID-19 virus infection Active Problems:   Melena   Suprapubic abdominal pain   Encounter for screening for HIV   Pneumonia due to COVID-19 virus    Discharge instructions    Discharge Instructions    Diet - low sodium heart healthy   Complete by: As directed    Discharge instructions   Complete by: As directed    Follow with Primary MD Caren Macadam, MD in 7 days   Get CBC, D Dimer, CMP, 2 view Chest X ray -  checked next visit within 1 week by Primary MD   Activity: As tolerated with Full fall precautions use walker/cane & assistance as needed  Disposition Home    Diet: Heart Healthy    Special Instructions: If you have smoked or chewed Tobacco  in the last 2 yrs please stop smoking, stop any regular Alcohol  and or any Recreational drug use.  On your next visit with your primary care  physician please Get Medicines reviewed and adjusted.  Please request your Prim.MD to go over all Hospital Tests and Procedure/Radiological results at the follow up, please get all Hospital records sent to your Prim MD by signing hospital release before you go home.  If you experience worsening of your admission symptoms, develop shortness of breath, life threatening emergency, suicidal or homicidal thoughts you must seek medical attention immediately by calling 911 or calling your MD immediately  if symptoms less severe.  You Must read complete instructions/literature along with all the possible adverse reactions/side effects for all the Medicines you take and that have been prescribed to you. Take any new Medicines after you have completely understood and accpet all the possible adverse reactions/side effects.   Increase activity slowly   Complete by: As directed    MyChart COVID-19 home monitoring program   Complete by: May 02, 2019    Is the patient willing to use the [place] for home monitoring?: Yes   Temperature monitoring   Complete by: May 02, 2019    After how many days would you like to receive a notification of this patient's flowsheet entries?: 1      Discharge Medications   Allergies as of 05/02/2019      Reactions   Aspirin Other (See Comments)   discomfort   Codeine Itching   Sulfamethoxazole-trimethoprim Other (See Comments)   Unknown reaction per pt   Trileptal [oxcarbazepine]    itching   Trimethoprim Other (See Comments)   Unknown, per pt   Ultram [tramadol] Itching   Vioxx [rofecoxib]    Unknown per pt      Medication List    TAKE these medications   Albuterol Sulfate 108 (90 Base) MCG/ACT Aepb Commonly known as: ProAir RespiClick Inhale 2 puffs into the lungs every 6 (six) hours as needed. What changed: reasons to take this   apixaban 2.5 MG Tabs tablet Commonly known as: Eliquis Take 1 tablet (2.5 mg total) by mouth 2 (two) times  daily.   busPIRone 15 MG tablet Commonly known as: BUSPAR Take 1 tablet (15 mg total) by mouth 3 (three) times daily.   cetirizine 10 MG tablet Commonly known as: ZYRTEC Take 10 mg  by mouth daily.   colestipol 1 g tablet Commonly known as: COLESTID TAKE 2 TABLETS BY MOUTH ONCE DAILY FOR 7 DAYS AND THEN INCREASE TO 2 TWICE DAILY THEREAFTER What changed:   how much to take  how to take this  when to take this  additional instructions   diazepam 5 MG tablet Commonly known as: VALIUM TAKE 1 TABLET BY MOUTH EVERY 8 HOURS AS NEEDED FOR ANXIETY What changed:   reasons to take this  additional instructions   doxazosin 4 MG tablet Commonly known as: CARDURA Take 1.5 tablets (6 mg total) by mouth at bedtime.   famotidine 20 MG tablet Commonly known as: PEPCID Take 20 mg by mouth 2 (two) times daily.   fluticasone 44 MCG/ACT inhaler Commonly known as: Flovent HFA Inhale 2 puffs into the lungs 2 (two) times a day.   fluticasone 50 MCG/ACT nasal spray Commonly known as: FLONASE Use 2 spray(s) in each nostril once daily What changed: See the new instructions.   methylPREDNISolone 4 MG Tbpk tablet Commonly known as: MEDROL DOSEPAK follow package directions   mirtazapine 15 MG tablet Commonly known as: REMERON TAKE 1 TABLET BY MOUTH AT BEDTIME   montelukast 10 MG tablet Commonly known as: SINGULAIR TAKE 1 TABLET BY MOUTH AT BEDTIME   VITAMIN D-3 PO Take 5,000 Units by mouth daily.            Durable Medical Equipment  (From admission, onward)         Start     Ordered   05/01/19 0907  For home use only DME oxygen  Once    Question Answer Comment  Length of Need 6 Months   Mode or (Route) Nasal cannula   Liters per Minute 2   Frequency Continuous (stationary and portable oxygen unit needed)   Oxygen conserving device Yes   Oxygen delivery system Gas      05/01/19 0907   04/29/19 0931  For home use only DME 3 n 1  Once     04/29/19 0930   04/29/19  0931  For home use only DME oxygen  Once    Question Answer Comment  Length of Need 6 Months   Mode or (Route) Nasal cannula   Liters per Minute 2   Frequency Continuous (stationary and portable oxygen unit needed)   Oxygen delivery system Gas      04/29/19 0931          Follow-up Information    Caren Macadam, MD. Schedule an appointment as soon as possible for a visit in 1 week(s).   Specialty: Family Medicine Contact information: [address] [place] [id number] [phone]           Major procedures and Radiology Reports - PLEASE review detailed and final reports thoroughly  -         Ct Angio Chest Pe W Or Wo Contrast  Result Date: 04/22/2019 CLINICAL DATA:  63 year old female with a history of shortness of breath and abnormal chest x-ray EXAM: CT ANGIOGRAPHY CHEST WITH CONTRAST TECHNIQUE: Multidetector CT imaging of the chest was performed using the standard protocol during bolus administration of intravenous contrast. Multiplanar CT image reconstructions and MIPs were obtained to evaluate the vascular anatomy. CONTRAST:  155mL OMNIPAQUE IOHEXOL 350 MG/ML SOLN COMPARISON:  04/10/2009, chest x-ray 04/21/2019, 06/21/2016 FINDINGS: Cardiovascular: Heart: No cardiomegaly. No pericardial fluid/thickening. Calcifications of the left anterior descending coronary artery. Aorta: Unremarkable course, caliber, contour of the thoracic aorta. No aneurysm  or dissection flap. No periaortic fluid. Pulmonary arteries: No filling defects of the main pulmonary artery, lobar arteries, segmental or proximal subsegmental arteries. Mediastinum/Nodes: Small lymph nodes of the mediastinum. Unremarkable appearance of the thoracic esophagus. Unremarkable thoracic inlet. Lungs/Pleura: Pattern of peripheral ground-glass and linear opacities of the bilateral lungs, more prominent on the left and predominantly in a peripheral distribution/subpleural distribution. There is a gradient  towards the upper lungs. No pleural effusion or pneumothorax. Linear changes in the bilateral lung bases. No endotracheal or endobronchial debris. Upper Abdomen: No acute. Musculoskeletal: No acute displaced fracture. Degenerative changes of the spine. Review of the MIP images confirms the above findings. IMPRESSION: CT is negative for pulmonary emboli. There are a spectrum of findings in the lungs which are compatible with acute/subacute atypical infection (as well as other non-infectious etiologies), such as COVID-19 infection. Coronary artery disease. Electronically Signed   By: Corrie Mckusick D.O.   On: 04/22/2019 20:55   Dg Chest Port 1 View  Result Date: 04/26/2019 CLINICAL DATA:  Shortness of breath EXAM: PORTABLE CHEST 1 VIEW COMPARISON:  CTA chest dated 04/22/2019 FINDINGS: Multifocal patchy opacities left lung predominant, with relative sparing of the right upper lobe. Subpleural/peripheral distribution. This appearance is compatible with multifocal pneumonia and may be mildly progressive priors. No pleural effusion or pneumothorax. The heart is normal in size. IMPRESSION: Multifocal pneumonia in this patient with known COVID, mildly progressive. Electronically Signed   By: [name] M.D.   On: 04/26/2019 10:10   Dg Chest Port 1 View  Result Date: 04/21/2019 CLINICAL DATA:  Shortness of breath and cough. Former smoker. EXAM: PORTABLE CHEST 1 VIEW COMPARISON:  06/21/2016 FINDINGS: Heart size is at the upper limits of normal. Asymmetric ill-defined areas of airspace opacity are seen in the left lung which are new, and suspicious for viral or other atypical pneumonia. No evidence of pleural effusion. IMPRESSION: New ill-defined areas of airspace opacity in left lung, suspicious for viral or other atypical pneumonia. Electronically Signed   By: Marlaine Hind M.D.   On: 04/21/2019 20:07    Micro Results     No results found for this or any previous visit (from the past 240 hour(s)).  Today    Subjective    Elysabeth Norgren today has no headache,no chest abdominal pain,no new weakness tingling or numbness, feels much better wants to go home today.     Objective   Blood pressure (!) 109/47, pulse 90, temperature 98.1 F (36.7 C), temperature source Oral, resp. rate 18, height 5\' 1"  (1.549 m), weight 78.9 kg, SpO2 91 %.  No intake or output data in the 24 hours ending 05/02/19 0957  Exam Awake Alert, Oriented x 3, No new F.N deficits, Normal affect [place].AT,PERRAL Supple Neck,No JVD, No cervical lymphadenopathy appriciated.  Symmetrical Chest wall movement, Good air movement bilaterally, CTAB RRR,No Gallops,Rubs or new Murmurs, No Parasternal Heave +ve B.Sounds, Abd Soft, Non tender, No organomegaly appriciated, No rebound -guarding or rigidity. No Cyanosis, Clubbing or edema, No new Rash or bruise   Data Review   CBC w Diff:  Lab Results  Component Value Date   WBC 5.7 05/02/2019   HGB 13.4 05/02/2019   HCT 41.2 05/02/2019   PLT 315 05/02/2019   LYMPHOPCT 9 05/02/2019   MONOPCT 9 05/02/2019   EOSPCT 2 05/02/2019   BASOPCT 0 05/02/2019    CMP:  Lab Results  Component Value Date   NA 134 (L) 05/01/2019   K 3.6 05/01/2019   CL  98 05/01/2019   CO2 23 05/01/2019   BUN 13 05/01/2019   CREATININE 0.61 05/01/2019   PROT 5.7 (L) 05/01/2019   ALBUMIN 3.0 (L) 05/01/2019   BILITOT 0.9 05/01/2019   ALKPHOS 55 05/01/2019   AST 23 05/01/2019   ALT 42 05/01/2019  .   Total Time in preparing paper work, data evaluation and todays exam - 107 minutes  Lala Lund M.D on 05/02/2019 at 9:57 AM  [facility]   Office  [phone]

## 2019-05-02 NOTE — Progress Notes (Signed)
Occupational Therapy Treatment Patient Details Name: Brittany [name] MRN: [id number] DOB: 03/30/1956 Today's Date: 05/02/2019    History of present illness 63 year old with a history of asthma, depression, bipolar disorder, PTSD, GERD, and anxiety who presented to [facility] ED 04/21/19 with progressive shortness of breath of 1 weeks duration. In ED, SpO2 88% on room air, fever, CXR noted ill-defined areas of airspace opacity throughout left lung. Positive for COVID-19.   OT comments  Focus of session on energy conservation and activity modification. Pt desats to 86 with ADL activity with rebound to 91 with use of pursed lip breathing. Discussed monitoring O2 at home, especially with increased activity. Pt needs 3in1 to use as shower chair. [facility]   Follow Up Recommendations  Home health OT;Supervision/Assistance - 24 hour    Equipment Recommendations  3 in 1 bedside commode    Recommendations for Other Services      Precautions / Restrictions Precautions Precautions: Fall Precaution Comments: monitor sats and HR, wean down  O2 to 4 L this visit, encourage flutter valve after toileting and moving around.       Mobility Bed Mobility Overal bed mobility: Modified Independent                Transfers Overall transfer level: Modified independent                    Balance Overall balance assessment: Mild deficits observed, not formally tested                                         ADL either performed or assessed with clinical judgement   ADL                                       Functional mobility during ADLs: Modified independent General ADL Comments: Educated on energy conservation strategies and activity modification. REcommend useof 3in1 as shower chair. Issued reacher adn long handled sponge due to pt statingshe is unable to afford AE adn she lives alone     Vision        Perception     Praxis      Cognition Arousal/Alertness: Awake/alert Behavior During Therapy: Anxious Overall Cognitive Status: Within Functional Limits for tasks assessed                                          Exercises Other Exercises Other Exercises: pursed lip breathing Other Exercises: flutter valve x 10; incentive spiometer x 10   Shoulder Instructions       General Comments Pt anxious about going home and "something happening"; pt became anxious when attempting to monitor O2 sats with O2 given SpO2 was 87-88 at rest on room air. Unable to get red from R earlobe probe.  Educated on importance of breathing exercises adn anxiety management    Pertinent Vitals/ Pain       Pain Assessment: Faces Faces Pain Scale: Hurts a little bit Pain Location: stomach Pain Descriptors / Indicators: Discomfort Pain Intervention(s): Limited activity within patient's tolerance  Home Living  Prior Functioning/Environment              Frequency  Min 3X/week        Progress Toward Goals  OT Goals(current goals can now be found in the care plan section)  Progress towards OT goals: Progressing toward goals  Acute Rehab OT Goals Patient Stated Goal: to get home to her cats  OT Goal Formulation: With patient Time For Goal Achievement: 05/08/19 Potential to Achieve Goals: Good ADL Goals Pt Will Perform Grooming: with modified independence;standing Pt Will Perform Lower Body Bathing: with modified independence;sit to/from stand Pt Will Perform Lower Body Dressing: with modified independence;sit to/from stand Pt Will Transfer to Toilet: with modified independence;ambulating Pt Will Perform Toileting - Clothing Manipulation and hygiene: with modified independence;sit to/from stand Pt Will Perform Tub/Shower Transfer: Tub transfer;with modified independence;ambulating;shower seat Additional ADL Goal #1: Pt  will demonstrate improved activity tolerance in ability to maintain standing >34min during ADL task prior to requiring seated rest break.  Plan Discharge plan remains appropriate    Co-evaluation                 AM-PAC OT "6 Clicks" Daily Activity     Outcome Measure   Help from another person eating meals?: None Help from another person taking care of personal grooming?: None Help from another person toileting, which includes using toliet, bedpan, or urinal?: None Help from another person bathing (including washing, rinsing, drying)?: A Little Help from another person to put on and taking off regular upper body clothing?: None Help from another person to put on and taking off regular lower body clothing?: A Little 6 Click Score: 22    End of Session    OT Visit Diagnosis: Muscle weakness (generalized) (M62.81);Other (comment)   Activity Tolerance Patient tolerated treatment well   Patient Left in bed;with call bell/phone within reach(sitting EOB)   Nurse Communication Other (comment)(need for 3in1)        [id number] OT Time Calculation (min): 37 min  Charges: OT General Charges $OT Visit: 1 Visit OT Treatments $Self Care/Home Management : 23-37 mins  Maurie Boettcher, OT/L   Acute OT Clinical Specialist [place] Pager [phone] Office [phone]    [facility] 05/02/2019, 9:37 AM

## 2019-05-02 NOTE — TOC Transition Note (Signed)
Transition of Care [facility]) - CM/SW Discharge Note   Patient Details  Name: Brittany [name] MRN: [id number] Date of Birth: 09/22/1955  Transition of Care [facility]) CM/SW Contact:  Ninfa Meeker, RN Phone Number: 05/02/2019, 9:26 AM   Clinical Narrative:  Case manager spoke with patient concerning oxygen and DME needs for discharge. Referral called to Jeneen Rinks with [employer]. Patient states her daughter will be taking her home and asked that the [employer] rep contact her daughter, Brittany [name] [phone], to arrange delivery time. Case manager contacted Sam, [facility] and requested that a 3in1 and oxygen tank, pulse ox be delivered to patient's room for discharge. No further needs identified.      Final next level of care: Home/Self Care Barriers to Discharge: No Barriers Identified   Patient Goals and CMS Choice     Choice offered to / list presented to : Patient  Discharge Placement                       Discharge Plan and Services   Discharge Planning Services: CM Consult, Medication Assistance Post Acute Care Choice: Durable Medical Equipment          DME Arranged: 3-N-1, Oxygen DME Agency: [place] Date DME Agency Contacted: 05/02/19 Time DME Agency Contacted: [phone] Representative spoke with at DME Agency: Jeneen Rinks HH Arranged: NA [place] Agency: NA        Social Determinants of Health ([place]) Interventions     Readmission Risk Interventions No flowsheet data found.

## 2019-05-02 NOTE — Discharge Instructions (Signed)
Follow with Primary MD Caren Macadam, MD in 7 days   Get CBC, D Dimer, CMP, 2 view Chest X ray -  checked next visit within 1 week by Primary MD   Activity: As tolerated with Full fall precautions use walker/cane & assistance as needed  Disposition Home    Diet: Heart Healthy    Special Instructions: If you have smoked or chewed Tobacco  in the last 2 yrs please stop smoking, stop any regular Alcohol  and or any Recreational drug use.  On your next visit with your primary care physician please Get Medicines reviewed and adjusted.  Please request your Prim.MD to go over all Hospital Tests and Procedure/Radiological results at the follow up, please get all Hospital records sent to your Prim MD by signing hospital release before you go home.  If you experience worsening of your admission symptoms, develop shortness of breath, life threatening emergency, suicidal or homicidal thoughts you must seek medical attention immediately by calling 911 or calling your MD immediately  if symptoms less severe.  You Must read complete instructions/literature along with all the possible adverse reactions/side effects for all the Medicines you take and that have been prescribed to you. Take any new Medicines after you have completely understood and accpet all the possible adverse reactions/side effects.       Person Under Monitoring Name: Brittany [name]  Location: [address] [place] [address]   Infection Prevention Recommendations for Individuals Confirmed to have, or Being Evaluated for, 2019 Novel Coronavirus (COVID-19) Infection Who Receive Care at Home  Individuals who are confirmed to have, or are being evaluated for, COVID-19 should follow the prevention steps below until a healthcare provider or local or state health department says they can return to normal activities.  Stay home except to get medical care You should restrict activities outside your home, except  for getting medical care. Do not go to work, school, or public areas, and do not use public transportation or taxis.  Call ahead before visiting your doctor Before your medical appointment, call the healthcare provider and tell them that you have, or are being evaluated for, COVID-19 infection. This will help the healthcare providers office take steps to keep other people from getting infected. Ask your healthcare provider to call the local or state health department.  Monitor your symptoms Seek prompt medical attention if your illness is worsening (e.g., difficulty breathing). Before going to your medical appointment, call the healthcare provider and tell them that you have, or are being evaluated for, COVID-19 infection. Ask your healthcare provider to call the local or state health department.  Wear a facemask You should wear a facemask that covers your nose and mouth when you are in the same room with other people and when you visit a healthcare provider. People who live with or visit you should also wear a facemask while they are in the same room with you.  Separate yourself from other people in your home As much as possible, you should stay in a different room from other people in your home. Also, you should use a separate bathroom, if available.  Avoid sharing household items You should not share dishes, drinking glasses, cups, eating utensils, towels, bedding, or other items with other people in your home. After using these items, you should wash them thoroughly with soap and water.  Cover your coughs and sneezes Cover your mouth and nose with a tissue when you cough or sneeze, or  you can cough or sneeze into your sleeve. Throw used tissues in a lined trash can, and immediately wash your hands with soap and water for at least 20 seconds or use an alcohol-based hand rub.  Wash your [facility] your hands often and thoroughly with soap and water for at least 20 seconds. You can  use an alcohol-based hand sanitizer if soap and water are not available and if your hands are not visibly dirty. Avoid touching your eyes, nose, and mouth with unwashed hands.   Prevention Steps for Caregivers and Household Members of Individuals Confirmed to have, or Being Evaluated for, COVID-19 Infection Being Cared for in the Home  If you live with, or provide care at home for, a person confirmed to have, or being evaluated for, COVID-19 infection please follow these guidelines to prevent infection:  Follow healthcare providers instructions Make sure that you understand and can help the patient follow any healthcare provider instructions for all care.  Provide for the patients basic needs You should help the patient with basic needs in the home and provide support for getting groceries, prescriptions, and other personal needs.  Monitor the patients symptoms If they are getting sicker, call his or her medical provider and tell them that the patient has, or is being evaluated for, COVID-19 infection. This will help the healthcare providers office take steps to keep other people from getting infected. Ask the healthcare provider to call the local or state health department.  Limit the number of people who have contact with the patient  If possible, have only one caregiver for the patient.  Other household members should stay in another home or place of residence. If this is not possible, they should stay  in another room, or be separated from the patient as much as possible. Use a separate bathroom, if available.  Restrict visitors who do not have an essential need to be in the home.  Keep older adults, very young children, and other sick people away from the patient Keep older adults, very young children, and those who have compromised immune systems or chronic health conditions away from the patient. This includes people with chronic heart, lung, or kidney conditions,  diabetes, and cancer.  Ensure good ventilation Make sure that shared spaces in the home have good air flow, such as from an air conditioner or an opened window, weather permitting.  Wash your hands often  Wash your hands often and thoroughly with soap and water for at least 20 seconds. You can use an alcohol based hand sanitizer if soap and water are not available and if your hands are not visibly dirty.  Avoid touching your eyes, nose, and mouth with unwashed hands.  Use disposable paper towels to dry your hands. If not available, use dedicated cloth towels and replace them when they become wet.  Wear a facemask and gloves  Wear a disposable facemask at all times in the room and gloves when you touch or have contact with the patients blood, body fluids, and/or secretions or excretions, such as sweat, saliva, sputum, nasal mucus, vomit, urine, or feces.  Ensure the mask fits over your nose and mouth tightly, and do not touch it during use.  Throw out disposable facemasks and gloves after using them. Do not reuse.  Wash your hands immediately after removing your facemask and gloves.  If your personal clothing becomes contaminated, carefully remove clothing and launder. Wash your hands after handling contaminated clothing.  Place all used disposable facemasks,  gloves, and other waste in a lined container before disposing them with other household waste.  Remove gloves and wash your hands immediately after handling these items.  Do not share dishes, glasses, or other household items with the patient  Avoid sharing household items. You should not share dishes, drinking glasses, cups, eating utensils, towels, bedding, or other items with a patient who is confirmed to have, or being evaluated for, COVID-19 infection.  After the person uses these items, you should wash them thoroughly with soap and water.  Wash laundry thoroughly  Immediately remove and wash clothes or bedding that have  blood, body fluids, and/or secretions or excretions, such as sweat, saliva, sputum, nasal mucus, vomit, urine, or feces, on them.  Wear gloves when handling laundry from the patient.  Read and follow directions on labels of laundry or clothing items and detergent. In general, wash and dry with the warmest temperatures recommended on the label.  Clean all areas the individual has used often  Clean all touchable surfaces, such as counters, tabletops, doorknobs, bathroom fixtures, toilets, phones, keyboards, tablets, and bedside tables, every day. Also, clean any surfaces that may have blood, body fluids, and/or secretions or excretions on them.  Wear gloves when cleaning surfaces the patient has come in contact with.  Use a diluted bleach solution (e.g., dilute bleach with 1 part bleach and 10 parts water) or a household disinfectant with a label that says EPA-registered for coronaviruses. To make a bleach solution at home, add 1 tablespoon of bleach to 1 quart (4 cups) of water. For a larger supply, add  cup of bleach to 1 gallon (16 cups) of water.  Read labels of cleaning products and follow recommendations provided on product labels. Labels contain instructions for safe and effective use of the cleaning product including precautions you should take when applying the product, such as wearing gloves or eye protection and making sure you have good ventilation during use of the product.  Remove gloves and wash hands immediately after cleaning.  Monitor yourself for signs and symptoms of illness Caregivers and household members are considered close contacts, should monitor their health, and will be asked to limit movement outside of the home to the extent possible. Follow the monitoring steps for close contacts listed on the symptom monitoring form.   ? If you have additional questions, contact your local health department or call the epidemiologist on call at [phone] (available 24/7). ?  This guidance is subject to change. For the most up-to-date guidance from [facility], please refer to their website: [email or web address]

## 2019-05-02 NOTE — Progress Notes (Signed)
Patient heart rate has been evaluated, MD aware. No new orders. Will continue to monitor. Patient is not symptomatic, just anxious.

## 2019-05-04 ENCOUNTER — Telehealth: Payer: Self-pay | Admitting: *Deleted

## 2019-05-04 NOTE — Telephone Encounter (Signed)
Transition Care Management Follow-up Telephone Call   Date discharged? 11.14.2020   How have you been since you were released from the hospital? Hanging in there    Do you understand why you were in the hospital? yes   Do you understand the discharge instructions? yes   Where were you discharged to? Home    Items Reviewed:  Medications reviewed: yes  Allergies reviewed: yes  Dietary changes reviewed: no  Referrals reviewed: no   Functional Questionnaire:   Activities of Daily Living (ADLs):   She states they are independent in the following: ambulation, bathing and hygiene, feeding, continence, grooming, toileting and dressing States they require assistance with the following: N/A   Any transportation issues/concerns?: no   Any patient concerns? no   Confirmed importance and date/time of follow-up visits scheduled yes  Provider Appointment booked with Dr. Ethlyn Gallery Friday at 10:00AM virtually   Confirmed with patient if condition begins to worsen call PCP or go to the ER.  Patient was given the office number and encouraged to call back with question or concerns.  : yes

## 2019-05-05 DIAGNOSIS — U071 COVID-19: Secondary | ICD-10-CM | POA: Diagnosis not present

## 2019-05-07 NOTE — Progress Notes (Signed)
Virtual Visit via Video Note  I connected with Brittany [name]  on 05/07/19 at 10:00 AM EST by a video enabled telemedicine application and verified that I am speaking with the correct person using two identifiers.  Location patient: home Location provider:work or home office Persons participating in the virtual visit: patient, provider  I discussed the limitations of evaluation and management by telemedicine and the availability of in person appointments. The patient expressed understanding and agreed to proceed.   Brittany [name] DOB: 1955-12-03 Encounter date: 05/08/2019  This is a 63 y.o. female who presents with No chief complaint on file.   History of present illness: From discharge summary: Acute Hypoxic Resp Failure due to Covid 19 Viral pneumonia:She has been adequately treated with IV steroids, convalescent plasma and remdesivir, unfortunately she is still quite hypoxic 3 days after appropriate treatment, her CRP was falsely lowered now after continued IV steroid use CRP initially was 15, increased her steroid dose and gave her Actemra on 04/26/2019. Actemra she is now down to 1-2L nasal cannula oxygen on 04/28/2019 from 15 L high flow nasal cannula oxygen on 04/27/2019, also noted CRP trending down.   Feels a whole lot better now on 1 to 2 L nasal cannula oxygen and eager to go home, she will be discharged on home oxygen along with oral steroid taper. Since her D-dimer is still elevated she will get 2 weeks of Eliquis at prophylactic dose.  PCP to monitor.  Health PT and RN also ordered.  Feels like things are getting a little better every day. Right now just exhausted from getting up, dressed, and ready for this visit. Doing her lung exercises as much and often as she can. It does make her cough, but was told this is a good thing. Does feel that breathing is a little struggle; feels like asthma from past. Hasn't been using albuterol. Has been using her flovent as  directed. Doesn't think she has had albuterol since first couple of days in the hospital. Just finished steroid taper. Exposure to [place] was around 10/24. She had been experiencing a sore throat, cough, nasal congestion - more allergy symptoms that are typical for her yearly. Seemed to worsen daily from 27th on. Physically felt ok. Then on 11/3 started having hard time breathing. Son in law diagnosed, daughter told she likely had it. They had bough pulse ox which they gave to her. O2 level was 74% and in the low 80's. Talked with [facility] and went to the hospital. She had fever as well - highest it got was 100.4. Fever came on and off.   No loss of taste/smell. She does have oxygen for home use. She is using it when she feels need for it. She is checking pulse ox regularly and if she can't get pulse ox up to 90 then she will put on oxygen. She slept with it a couple of nights ago; but not last night. This morning oxygen level was good (93 on room air).   Heart rate is staying elevated at 107. Doesn't feel like it is racing. Has walker and a cane and uses one or the other when she gets up. Every now and then feels a little faint - not like passing out, but more weak.   Appetite comes and goes. She is lactose intolerant and in hospital was given all milk products. Lost almost 10 pounds in last week and a half. Daughter has prepared, frozen several meals. Body is wanting juice, fruit.  She has broth at home. Doesn't have enough energy for meal prep.  Tries to waken up lungs in morning. Does exercises, takes meds, checks pulse ox. Then just takes it easy. Cares for cats, personal needs. Just taking slow pace. No rushing.    Allergies  Allergen Reactions  . Aspirin Other (See Comments)    discomfort  . Codeine Itching  . Sulfamethoxazole-Trimethoprim Other (See Comments)    Unknown reaction per pt  . Trileptal [Oxcarbazepine]     itching  . Trimethoprim Other (See Comments)    Unknown, per pt  . Ultram  [Tramadol] Itching  . Vioxx [Rofecoxib]     Unknown per pt   No outpatient medications have been marked as taking for the 05/08/19 encounter (Appointment) with Caren Macadam, MD.    Review of Systems  Constitutional: Positive for fatigue. Negative for chills and fever.  Respiratory: Positive for cough, shortness of breath and wheezing. Negative for chest tightness.   Cardiovascular: Negative for chest pain, palpitations and leg swelling.  Gastrointestinal: Negative for abdominal pain, constipation, diarrhea, nausea and vomiting.  Genitourinary: Negative for difficulty urinating.    Objective:  There were no vitals taken for this visit.      BP Readings from Last 3 Encounters:  05/02/19 115/77  11/28/18 120/70  04/29/18 118/80   Wt Readings from Last 3 Encounters:  04/21/19 174 lb (78.9 kg)  01/22/19 178 lb (80.7 kg)  11/28/18 187 lb 3.2 oz (84.9 kg)    EXAM:  GENERAL: alert, oriented, appears tired, but no acute distress  HEENT: atraumatic, conjunctiva clear, no obvious abnormalities on inspection of external nose and ears  NECK: normal movements of the head and neck  LUNGS: on inspection no signs of respiratory distress, breathing rate appears normal, she does cough intermittently during exam.   CV: no obvious cyanosis  MS: moves all visible extremities without noticeable abnormality  PSYCH/NEURO: pleasant and cooperative, no obvious depression or anxiety, speech and thought processing grossly intact  Assessment/Plan  1. Pneumonia due to COVID-19 virus We will plan to repeat a chest x-ray as well as blood work next week for follow-up from her hospital stay.  She is improving, although slowly.  She does have fatigue daily.  Steroid taper is ending today.  I did encourage her to use albuterol treatments to help with wheezing and keeping lungs open.  Suggested she use this twice daily for this week and can progress to as needed once breathing is feeling more  stable. - DG Chest 2 View; Future - CBC with Differential/Platelet; Future - Comprehensive metabolic panel; Future  2. Positive D dimer We will recheck this level.  Did refill her Eliquis today and check with the pharmacy and it is covered with her previous coupon.  1 month supply was sent in for her. - D-dimer, quantitative (not at [facility]); Future  3. Cough variant asthma  vs UACS  See above.    I discussed the assessment and treatment plan with the patient. The patient was provided an opportunity to ask questions and all were answered. The patient agreed with the plan and demonstrated an understanding of the instructions.   The patient was advised to call back or seek an in-person evaluation if the symptoms worsen or if the condition fails to improve as anticipated.  I provided 30 minutes of non-face-to-face time during this encounter.   Micheline Rough, MD

## 2019-05-08 ENCOUNTER — Telehealth: Payer: Self-pay | Admitting: Family Medicine

## 2019-05-08 ENCOUNTER — Telehealth: Payer: Self-pay | Admitting: *Deleted

## 2019-05-08 ENCOUNTER — Other Ambulatory Visit: Payer: Self-pay | Admitting: Family Medicine

## 2019-05-08 ENCOUNTER — Encounter: Payer: Self-pay | Admitting: Family Medicine

## 2019-05-08 ENCOUNTER — Telehealth (INDEPENDENT_AMBULATORY_CARE_PROVIDER_SITE_OTHER): Payer: PPO | Admitting: Family Medicine

## 2019-05-08 ENCOUNTER — Other Ambulatory Visit: Payer: Self-pay

## 2019-05-08 VITALS — HR 91 | Temp 98.2°F | Wt 168.0 lb

## 2019-05-08 DIAGNOSIS — U071 COVID-19: Secondary | ICD-10-CM | POA: Diagnosis not present

## 2019-05-08 DIAGNOSIS — R7989 Other specified abnormal findings of blood chemistry: Secondary | ICD-10-CM | POA: Diagnosis not present

## 2019-05-08 DIAGNOSIS — J1289 Other viral pneumonia: Secondary | ICD-10-CM

## 2019-05-08 DIAGNOSIS — J45991 Cough variant asthma: Secondary | ICD-10-CM

## 2019-05-08 DIAGNOSIS — J1282 Pneumonia due to coronavirus disease 2019: Secondary | ICD-10-CM

## 2019-05-08 MED ORDER — APIXABAN 2.5 MG PO TABS: 2.5000 mg | ORAL_TABLET | Freq: Two times a day (BID) | ORAL | 1 refills | Status: DC

## 2019-05-08 MED ORDER — APIXABAN 2.5 MG PO TABS: 2.5000 mg | ORAL_TABLET | Freq: Two times a day (BID) | ORAL | 0 refills | Status: DC

## 2019-05-08 NOTE — Telephone Encounter (Signed)
Pharmacy calling and states that the patient is upset that there was only 14 tablets(1 weeks worth) of apixaban (ELIQUIS) 2.5 MG TABS tablet Called in. Would like to know if more could be called in? Please advise. CB#: [place], [place]

## 2019-05-08 NOTE — Telephone Encounter (Signed)
-----   Message from Caren Macadam, MD sent at 05/08/2019 10:27 AM EST ----- Needs appointment for bloodwork and cxr next week

## 2019-05-08 NOTE — Telephone Encounter (Signed)
Done. I'm checking still on where she needs to go for lab/xray.

## 2019-05-08 NOTE — Telephone Encounter (Signed)
I informed the pt I called [facility] and spoke with Mika who stated the pt has several discount cards on her profile and with her insurance, the cost for Eliquis is $10.50.  Patient advised to talk with the pharmacy as she stated she has a coupon to get this at no cost.

## 2019-05-08 NOTE — Telephone Encounter (Signed)
Copied from [place] [id number]. Topic: General - Other >> May 08, 2019  2:30 PM Carolyn Stare wrote: Pt call to say the below RX need to be written for 30 days so that with her coupon her RX will be for free   apixaban (ELIQUIS) 2.5 MG TABS tablet  [facility]

## 2019-05-08 NOTE — Telephone Encounter (Signed)
She is okay to come in for labs and x-ray next week.

## 2019-05-08 NOTE — Telephone Encounter (Signed)
Due to prior positive COVID test, spoke with the pt and she stated she still has a cough that is related to her doing her exercises.  Message sent to Dr Ethlyn Gallery for recommendations for testing.

## 2019-05-11 NOTE — Telephone Encounter (Signed)
Spoke with the pt and scheduled a lab appt for 11/24 at 7:40am.  Patient advised to arrive at 7:30 and informed her daughter can come in to assist her if needed if she does not have any symptoms of COVID.  Patient stated she has not been out by herself and wanted to know if she should bring her oxygen.  I advised the pt if she was advised to use O2 when she is out or at home, she should have this with her at all times and we do have O2 here if needed.

## 2019-05-12 ENCOUNTER — Ambulatory Visit (INDEPENDENT_AMBULATORY_CARE_PROVIDER_SITE_OTHER): Payer: PPO

## 2019-05-12 ENCOUNTER — Other Ambulatory Visit (INDEPENDENT_AMBULATORY_CARE_PROVIDER_SITE_OTHER): Payer: PPO

## 2019-05-12 DIAGNOSIS — J1289 Other viral pneumonia: Secondary | ICD-10-CM

## 2019-05-12 DIAGNOSIS — R7989 Other specified abnormal findings of blood chemistry: Secondary | ICD-10-CM

## 2019-05-12 DIAGNOSIS — U071 COVID-19: Secondary | ICD-10-CM | POA: Diagnosis not present

## 2019-05-12 DIAGNOSIS — R0602 Shortness of breath: Secondary | ICD-10-CM | POA: Diagnosis not present

## 2019-05-12 DIAGNOSIS — R05 Cough: Secondary | ICD-10-CM | POA: Diagnosis not present

## 2019-05-12 DIAGNOSIS — J1282 Pneumonia due to coronavirus disease 2019: Secondary | ICD-10-CM

## 2019-05-12 LAB — CBC WITH DIFFERENTIAL/PLATELET
Basophils Absolute: 0.1 10*3/uL (ref 0.0–0.1)
Basophils Relative: 1.3 % (ref 0.0–3.0)
Eosinophils Absolute: 0.2 10*3/uL (ref 0.0–0.7)
Eosinophils Relative: 4.6 % (ref 0.0–5.0)
HCT: 40.2 % (ref 36.0–46.0)
Hemoglobin: 13.3 g/dL (ref 12.0–15.0)
Lymphocytes Relative: 20 % (ref 12.0–46.0)
Lymphs Abs: 0.8 10*3/uL (ref 0.7–4.0)
MCHC: 33 g/dL (ref 30.0–36.0)
MCV: 91.9 fl (ref 78.0–100.0)
Monocytes Absolute: 0.4 10*3/uL (ref 0.1–1.0)
Monocytes Relative: 9 % (ref 3.0–12.0)
Neutro Abs: 2.6 10*3/uL (ref 1.4–7.7)
Neutrophils Relative %: 65.1 % (ref 43.0–77.0)
Platelets: 231 10*3/uL (ref 150.0–400.0)
RBC: 4.38 Mil/uL (ref 3.87–5.11)
RDW: 15.5 % (ref 11.5–15.5)
WBC: 4 10*3/uL (ref 4.0–10.5)

## 2019-05-12 LAB — COMPREHENSIVE METABOLIC PANEL
ALT: 34 U/L (ref 0–35)
AST: 17 U/L (ref 0–37)
Albumin: 3.5 g/dL (ref 3.5–5.2)
Alkaline Phosphatase: 50 U/L (ref 39–117)
BUN: 10 mg/dL (ref 6–23)
CO2: 30 mEq/L (ref 19–32)
Calcium: 9.1 mg/dL (ref 8.4–10.5)
Chloride: 104 mEq/L (ref 96–112)
Creatinine, Ser: 0.72 mg/dL (ref 0.40–1.20)
GFR: 81.62 mL/min (ref >60.00)
Glucose, Bld: 94 mg/dL (ref 70–99)
Potassium: 4.7 mEq/L (ref 3.5–5.1)
Sodium: 142 mEq/L (ref 135–145)
Total Bilirubin: 0.6 mg/dL (ref 0.2–1.2)
Total Protein: 5.7 g/dL — ABNORMAL LOW (ref 6.0–8.3)

## 2019-05-13 LAB — D-DIMER, QUANTITATIVE: D-Dimer, Quant: 1.06 mcg/mL FEU — ABNORMAL HIGH (ref <0.50)

## 2019-05-18 ENCOUNTER — Telehealth: Payer: Self-pay | Admitting: *Deleted

## 2019-05-18 NOTE — Telephone Encounter (Signed)
Copied from [place] [id number]. Topic: Appointment Scheduling - Scheduling Inquiry for Clinic >> May 18, 2019  9:51 AM Virl Axe D wrote: Reason for CRM: Pt called to schedule virtual appt with Dr. Ethlyn Gallery. No answer on FC line. Please return call.  Spoke with patient this afternoon about lab results.  No mention about having a virtual

## 2019-05-18 NOTE — Telephone Encounter (Signed)
Copied from [place] [id number]. Topic: General - Inquiry >> May 15, 2019  3:01 PM Reyne Dumas L wrote: Reason for CRM:   Pt states that she is calling to get lab results and results of chest xray.    Pt can be reached at [phone]

## 2019-05-19 ENCOUNTER — Telehealth: Payer: Self-pay | Admitting: Internal Medicine

## 2019-05-19 ENCOUNTER — Encounter: Payer: Self-pay | Admitting: Family Medicine

## 2019-05-19 ENCOUNTER — Other Ambulatory Visit: Payer: Self-pay | Admitting: Family Medicine

## 2019-05-19 DIAGNOSIS — R7989 Other specified abnormal findings of blood chemistry: Secondary | ICD-10-CM

## 2019-05-19 NOTE — Telephone Encounter (Signed)
Fine with me

## 2019-05-19 NOTE — Telephone Encounter (Signed)
Fine with me, but I do not think coming into the office with symptoms is wise. I think we should start as a tele health visit if she is having fevers, etc.   LPC

## 2019-05-19 NOTE — Telephone Encounter (Signed)
Call returned to patient, confirmed DOB, she is requesting to see a female pulmonologist. She states she was positive for covid 11.3. She states she was D/C from hospital 11.14. She states every since then her oxygen levels range from 80's to low 90's. She is using 2L. She started having nose bleeds after starting the eliquis for PE. She had a fever this morning, but she thinks she may have a sinus infection 100.9 due to the frequent nose bleeds. She feels congested at times but a lot of sinus pressure. She also has ear pain and she can hear her heart beat in her ear.   I made her aware of office policy regarding switching providers. Voiced understanding.   MW please advise if okay to switch.  PC please advise if okay to see you. With symptoms okay to come in office? Reports fever today she thinks may be related to sinus infection. Thanks.

## 2019-05-19 NOTE — Telephone Encounter (Signed)
OK will forward to Dr Carlis Abbott to be sure she is ok with taking on pt  Please see the msg below from [place] and advise recs, thanks!

## 2019-05-19 NOTE — Telephone Encounter (Signed)
Brittany [name] -please call patient around 10-11 on Wednesday (after her GI appointment).  Please see how she is feeling overall?  Please see if she had any fevers through the day/night and see how her nosebleeds are doing?  Did she get pulmonology appointment? If she has transportation, I would like to get that repeat d dimer and a CBC with diff on her. She wasn't sure about transportation when we talked.   ------------------------------------------------------------------  I called patient back regarding the nosebleeds after she replied to my message on my chart.  She did call pulmonology to try to get an appointment and is waiting to hear back from them.  She is getting nosebleeds more frequently in the morning and does get multiple a day.  They do last for quite some time.  She is getting some relief with Neosporin and using cotton pads within the nose to help with bleeding.  She is using her room to notify her, but does not have humidified oxygen.  She is not having any other blood loss (like urine or stool).  I advised her to hold the Eliquis tonight and tomorrow morning until she hears from [place].  She does not have transportation to come to the lab right now, but it would be ideal to get some repeat blood work on her.  If she is able to follow-up before the end of the week with pulmonology, we could wait for that visit.  She is more active than she was previously, but still quite fatigued and having to lay down frequently.   She also states that she is just not feeling well today.  Breathing has been gradually improving somewhat, but feels that difficulty with breathing comes and goes somewhat.  She feels like she may be getting a sinus infection as she feels the urge to blow her nose then this triggers a bloody nose.  She also had a low-grade fever of 100.9.  This is better now.  She has some ear pain that is coming and going, which she has had with sinus infections in the past.

## 2019-05-19 NOTE — Progress Notes (Signed)
TELEHEALTH VISIT  Referring Provider: Caren Macadam, MD Primary Care Physician:  Caren Macadam, MD   Tele-visit due to COVID-19 pandemic Patient requested visit virtually, consented to the virtual encounter via video enabled telemedicine application (Zoom) Contact made at: 05/20/19 09:10 Patient verified by name and date of birth Location of patient: Home Location provider: [facility] Names of persons participating: Me, patient, Tinnie Gens CMA Time spent on telehealth visit: 26 minutes I discussed the limitations of evaluation and management by telemedicine. The patient expressed understanding and agreed to proceed.  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 Collene Mares in 2009, treated with Align    - Normal duodenal and colon biopsies 04/09/2018    - using Immodium PRN.     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 Collene Mares 2009    - no polyps on colonoscopy 04/09/2018  Cholecystectomy for cholelithiasis 16 years ago  Symptoms dramatically improved with empiric trial of Xifaxan but returned within 2 weeks of completing treatment.   Lactulose testing for small intestinal bacterial overgrowth 04/14/2019 showed suspected bacterial overgrowth.  We will plan an additional treatment trial.   PLAN: Continue to avoid dairy Continue Cholestipol (refilled with 3 months supply, 3 refills today) Repeat Xifaxan 550mg  TID x 2 weeks Trial of doxycycline 100 mg BID x 14 days if unable to afford Xifaxan or samples are not available Follow-up in 3 months, or earlier as needed    HPI: Brittany [name] is a 63 y.o. female who is seen in a follow-up virtual visit. She was seen in consultation 03/10/18 and in follow-up 04/23/18, 01/22/19, and 03/11/19. She had an EGD and colonoscopy 04/09/18.  She returns in scheduled virtual follow-up after a breath test for bacterial overgrowth.  GI symptoms largely improved on colestipol. She did not tolerate 2 BID due to severe constipation. She is taking 2 cholestipol in the morning and frequently 1 pill in the evening. She will titrate the dose to avoid further constipation.  Continues to have up to 5-7 formed BM daily. At it's worst, she was having 10-15 BM daily. Occasional mucous. No blood. Abdominal pain continues to be diffuse, can be severe, feels like stabbing or punching. Improved with defecation.  Associated, near constant bloating and early satiety. Bothered by a large amount of ongoing gas.   Two weeks of Xifaxan provided significant relief in her symptoms. She had no significant bloating or diarrhea during this time.  However all symptoms returned within 2 weeks of completing treatment.   Lactulose testing for small intestinal bacterial overgrowth 04/14/2019 showed suspected bacterial overgrowth.  Increase in hydrogen was 12 ppm, increase in methane 3 ppm, increase in combined hydrogen and  methane 15 ppm which is the cutoff for diagnosis.  She was hospitalized for covid 19 pneumonia 11/320-05/02/19. She has been adequately treated with IV steroids, convalescent plasma and remdesivir,  unfortunately she is still quite hypoxic 3 days after appropriate treatment, her CRP was falsely lowered now after continued IV steroid use CRP initially was 15, increased her steroid dose and gave her Actemra on 04/26/2019. She states every since then her oxygen levels range from mid-80's to -mid 90's. She is using oxygen PRN. During our virtual visit her oxygen saturation was 96% on room air.  She started having nose bleeds after starting the eliquis for PE. She is hopeful that she won't need to continue on the Eliquis given the ongoing bleeding.  There is been no melena, hematochezia, or bright red blood per rectum.   Her GI symptoms worsened while hospitalized for Covid as she did not receive her colestipol.  Resuming treatment following discharge has improved some of her symptoms.  However she continues to have 5-7 formed bowel movements daily and significant bloating.  Symptoms are similar to those that she experienced prior to empiric treatment with Xifaxan.   Prior endoscopy: Upper endoscopy and colonoscopy 04/09/2018  revealed pancolonic diverticulosis and was otherwise normal. Gastric biopsy showed chronic inactive gastritis with no evidence of H. pylori.  Biopsies from the duodenum and colon were normal  Past Medical History:  Diagnosis Date  . Abnormal EKG   . Anxiety   . Asthma   . Bipolar depression ([place]), Anxiety, PTSD, Panic disorder    -managed by [facility]  . Depression   . Foot pain   . GERD (gastroesophageal reflux disease)   . Heart murmur    as a child  . Insomnia   . Leg swelling   . Osteoporosis   . Tachycardia     Past Surgical History:  Procedure Laterality Date  . CARPAL TUNNEL RELEASE Left   . CESAREAN SECTION     x 2  . CHOLECYSTECTOMY    . ENDOMETRIAL ABLATION    . KNEE SURGERY Right    reconstruction for patellar dislocation  . LAPAROSCOPY     x 2  . TUBAL LIGATION    . ULNAR NERVE REPAIR Left     Current Outpatient Medications   Medication Sig Dispense Refill  . Albuterol Sulfate (PROAIR RESPICLICK) 123XX123 (90 Base) MCG/ACT AEPB Inhale 2 puffs into the lungs every 6 (six) hours as needed. (Patient taking differently: Inhale 2 puffs into the lungs every 6 (six) hours as needed (For shortness of breath). ) 1 each 1  . apixaban (ELIQUIS) 2.5 MG TABS tablet Take 1 tablet (2.5 mg total) by mouth 2 (two) times daily. 60 tablet 1  . busPIRone (BUSPAR) 15 MG tablet Take 1 tablet (15 mg total) by mouth 3 (three) times daily. 270 tablet 0  . cetirizine (ZYRTEC) 10 MG tablet Take 10 mg by mouth daily.    . Cholecalciferol (VITAMIN D-3 PO) Take 5,000 Units by mouth daily.     . colestipol (COLESTID) 1 g tablet TAKE 2 TABLETS BY MOUTH ONCE DAILY FOR 7 DAYS AND THEN INCREASE TO 2 TWICE DAILY THEREAFTER (Patient taking differently: Take 1 g by mouth 2 (two) times daily. ) 120 tablet 0  . diazepam (VALIUM) 5 MG tablet TAKE 1 TABLET BY MOUTH EVERY 8 HOURS AS NEEDED FOR ANXIETY (Patient taking differently: Take 5 mg by mouth every 8 (eight) hours as needed for anxiety. ) 90 tablet 2  .  doxazosin (CARDURA) 4 MG tablet Take 1.5 tablets (6 mg total) by mouth at bedtime. 135 tablet 1  . famotidine (PEPCID) 20 MG tablet Take 20 mg by mouth 2 (two) times daily.    . fluticasone (FLONASE) 50 MCG/ACT nasal spray Use 2 spray(s) in each nostril once daily (Patient taking differently: Place 2 sprays into both nostrils daily. ) 16 g 3  . fluticasone (FLOVENT HFA) 44 MCG/ACT inhaler Inhale 2 puffs into the lungs 2 (two) times a day. 3 Inhaler 1  . mirtazapine (REMERON) 15 MG tablet TAKE 1 TABLET BY MOUTH AT BEDTIME (Patient taking differently: Take 15 mg by mouth at bedtime. ) 30 tablet 2  . montelukast (SINGULAIR) 10 MG tablet TAKE 1 TABLET BY MOUTH AT BEDTIME (Patient taking differently: Take 10 mg by mouth at bedtime. ) 90 tablet 0   No current facility-administered medications for this visit.     Allergies as of 05/20/2019 - Review Complete 05/08/2019   Allergen Reaction Noted  . Aspirin Other (See Comments)   . Codeine Itching 09/26/2017  . Sulfamethoxazole-trimethoprim Other (See Comments)   . Trileptal [oxcarbazepine]  11/28/2018  . Trimethoprim Other (See Comments) 11/12/2014  . Ultram [tramadol] Itching 09/26/2017  . Vioxx [rofecoxib]      Family History  Problem Relation Age of Onset  . Heart failure Mother   . Hypertension Mother   . Hyperlipidemia Mother   . Other Father        killed  . COPD Paternal Aunt        alot of aunts and uncle COPD or Emphysema  . Emphysema Paternal Uncle   . Colon polyps Maternal Aunt   . COPD Brother   . Heart disease Brother   . Pulmonary embolism Brother   . Breast cancer Neg Hx   . Colon cancer Neg Hx   . Esophageal cancer Neg Hx   . Pancreatic cancer Neg Hx   . Stomach cancer Neg Hx   . Liver disease Neg Hx     Social History   Socioeconomic History  . Marital status: Single    Spouse name: Not on file  . Number of children: 3  . Years of education: Not on file  . Highest education level: Not on file  Occupational History  . Occupation: disabled  Social Needs  . Financial resource strain: Not on file  . Food insecurity    Worry: Not on file    Inability: Not on file  . Transportation needs    Medical: Not on file    Non-medical: Not on file  Tobacco Use  . Smoking status: Former Smoker    Packs/day: 20.00    Years: 1.00    Pack years: 20.00    Quit date: 06/19/1999    Years since quitting: 19.9  . Smokeless tobacco: Never Used  . Tobacco comment: 20 year estimate but probably less   Substance and Sexual Activity  . Alcohol use: Yes    Alcohol/week: 0.0 standard drinks    Comment: Occass.   . Drug use: No  . Sexual activity: Never  Lifestyle  . Physical activity    Days per week: Not on file    Minutes per session: Not on file  . Stress: Not on file  Relationships  . Social Herbalist on phone: Not on file    Gets together: Not on file     Attends religious service: Not on file    Active member of  club or organization: Not on file    Attends meetings of clubs or organizations: Not on file    Relationship status: Not on file  . Intimate partner violence    Fear of current or ex partner: Not on file    Emotionally abused: Not on file    Physically abused: Not on file    Forced sexual activity: Not on file  Other Topics Concern  . Not on file  Social History Narrative   Work or School: Disabled seconndary to psychiatric conditions      Home Situation: lives alone with 2 cats and one dog      Spiritual Beliefs: Christian      Lifestyle: no regular exercise, diet  Not great - wants to embark on healthier lifestyle    Physical Exam: Complete physical exam not performed due to the limits inherent in a telehealth encounter.  General: Awake, alert, and oriented, and well communicative. In no acute distress.  HEENT: EOMI, non-icteric sclera, NCAT, MMM  Neck: Normal movement of head and neck  Pulm: No labored breathing, speaking in full sentences without conversational dyspnea  Derm: No apparent lesions or bruising in visible field  MS: Moves all visible extremities without noticeable abnormality  Psych: Pleasant, cooperative, normal speech, normal affect and normal insight Neuro: Alert and appropriate   Denia Mcvicar L. Tarri Glenn, MD, MPH [facility] 05/19/2019, 8:32 PM

## 2019-05-20 ENCOUNTER — Other Ambulatory Visit: Payer: Self-pay

## 2019-05-20 ENCOUNTER — Ambulatory Visit (INDEPENDENT_AMBULATORY_CARE_PROVIDER_SITE_OTHER): Payer: PPO | Admitting: Critical Care Medicine

## 2019-05-20 ENCOUNTER — Encounter: Payer: Self-pay | Admitting: Gastroenterology

## 2019-05-20 ENCOUNTER — Ambulatory Visit (INDEPENDENT_AMBULATORY_CARE_PROVIDER_SITE_OTHER): Payer: PPO | Admitting: Gastroenterology

## 2019-05-20 DIAGNOSIS — J4541 Moderate persistent asthma with (acute) exacerbation: Secondary | ICD-10-CM | POA: Diagnosis not present

## 2019-05-20 DIAGNOSIS — J9601 Acute respiratory failure with hypoxia: Secondary | ICD-10-CM | POA: Diagnosis not present

## 2019-05-20 DIAGNOSIS — R140 Abdominal distension (gaseous): Secondary | ICD-10-CM

## 2019-05-20 DIAGNOSIS — R194 Change in bowel habit: Secondary | ICD-10-CM | POA: Diagnosis not present

## 2019-05-20 DIAGNOSIS — Z8616 Personal history of COVID-19: Secondary | ICD-10-CM

## 2019-05-20 DIAGNOSIS — K6389 Other specified diseases of intestine: Secondary | ICD-10-CM

## 2019-05-20 DIAGNOSIS — Z8619 Personal history of other infectious and parasitic diseases: Secondary | ICD-10-CM

## 2019-05-20 MED ORDER — ALBUTEROL SULFATE (2.5 MG/3ML) 0.083% IN NEBU: 2.5000 mg | INHALATION_SOLUTION | Freq: Four times a day (QID) | RESPIRATORY_TRACT | 12 refills | Status: DC | PRN

## 2019-05-20 MED ORDER — MOMETASONE FURO-FORMOTEROL FUM 100-5 MCG/ACT IN AERO: 2.0000 | INHALATION_SPRAY | Freq: Two times a day (BID) | RESPIRATORY_TRACT | 11 refills | Status: DC

## 2019-05-20 MED ORDER — COLESTIPOL HCL 1 G PO TABS: ORAL_TABLET | ORAL | 0 refills | Status: DC

## 2019-05-20 MED ORDER — DOXYCYCLINE HYCLATE 100 MG PO TABS: 100.0000 mg | ORAL_TABLET | Freq: Two times a day (BID) | ORAL | 0 refills | Status: AC

## 2019-05-20 NOTE — Progress Notes (Signed)
Synopsis: Referred in 2018 for asthma by Caren Macadam, MD.  Previously patient of Dr. Melvyn Novas and Dr. Gwenette Greet.   Subjective:   PATIENT ID: Brittany [name] GENDER: female DOB: 1956-01-19, MRN: [id number]  No chief complaint on file.   I connected with  Brittany [name] on 05/20/19 by a video enabled telemedicine application and verified that I am speaking with the correct person using two identifiers.   I discussed the limitations of evaluation and management by telemedicine. The patient expressed understanding and agreed to proceed.    Brittany [name] is a 63 year old woman who presents for follow-up.  She has a history of lifelong asthma.  She is recovering from recent COVID-19 viral pneumonia.  She was hospitalized at [facility] from November 3 to November 14.  She was discharged on home oxygen, which she has been using regularly.  She continues to use it at night, but is able to stay off of it sometimes during the day.  Currently her saturations are 93%.  While hospitalized she received steroids, remdesivir, convalescent plasma and Tocilizumab.  She was discharged on Eliquis for her high D-dimer, but did not have a confirmed DVT.  Her primary care physician is following her D-dimer to determine the appropriate time to stop Eliquis.  Since discharge she has had periodic epistaxis, which she has been managing with Neosporin on the inside her nose.  Her asthma was not fully controlled prior to her hospitalization, and it has been more noticeable since discharge.  It is improving; she has not used in a nebulizer at all today.  At first when she came home she was using them 3 times a day, was able to go down to twice a day.  She has had a fever since yesterday, and thinks that she may have a sinus infection.  She was recently prescribed doxycycline for small bowel bacterial overgrowth, but she is hoping this will help her sinuses.  She has chronic allergies to dust, mold, and cats.  She  refuses to give up her cats.  She follows with Dr. Fredderick Phenix from allergy and immunology.  She has care filters in her house to help with her allergies.  She is on Flovent twice daily with albuterol nebulizers.  She has been on Symbicort in the past, but had trouble affording this medication.  She has wheezing and shortness of breath.  Exercise triggers her asthma symptoms.       Flowsheet data:  Lab Results  Component Value Date   NITRICOXIDE 14 07/16/2016    Past Medical History:  Diagnosis Date   Abnormal EKG    Anxiety    Asthma    Bipolar depression ([place]), Anxiety, PTSD, Panic disorder    -managed by [facility]   Depression    Foot pain    GERD (gastroesophageal reflux disease)    Heart murmur    as a child   Insomnia    Leg swelling    Osteoporosis    Tachycardia      Family History  Problem Relation Age of Onset   Heart failure Mother    Hypertension Mother    Hyperlipidemia Mother    Other Father        killed   COPD Paternal Aunt        alot of aunts and uncle COPD or Emphysema   Emphysema Paternal Uncle    Colon polyps Maternal Aunt    COPD Brother    Heart disease Brother  Pulmonary embolism Brother    Breast cancer Neg Hx    Colon cancer Neg Hx    Esophageal cancer Neg Hx    Pancreatic cancer Neg Hx    Stomach cancer Neg Hx    Liver disease Neg Hx      Past Surgical History:  Procedure Laterality Date   CARPAL TUNNEL RELEASE Left    CESAREAN SECTION     x 2   CHOLECYSTECTOMY     ENDOMETRIAL ABLATION     KNEE SURGERY Right    reconstruction for patellar dislocation   LAPAROSCOPY     x 2   TUBAL LIGATION     ULNAR NERVE REPAIR Left     Social History   Socioeconomic History   Marital status: Single    Spouse name: Not on file   Number of children: 3   Years of education: Not on file   Highest education level: Not on file  Occupational History   Occupation: disabled  Social  Designer, fashion/clothing strain: Not on file   Food insecurity    Worry: Not on file    Inability: Not on file   Transportation needs    Medical: Not on file    Non-medical: Not on file  Tobacco Use   Smoking status: Former Smoker    Packs/day: 20.00    Years: 1.00    Pack years: 20.00    Quit date: 06/19/1999    Years since quitting: 19.9   Smokeless tobacco: Never Used   Tobacco comment: 20 year estimate but probably less   Substance and Sexual Activity   Alcohol use: Yes    Alcohol/week: 0.0 standard drinks    Comment: Occass.    Drug use: No   Sexual activity: Never  Lifestyle   Physical activity    Days per week: Not on file    Minutes per session: Not on file   Stress: Not on file  Relationships   Social connections    Talks on phone: Not on file    Gets together: Not on file    Attends religious service: Not on file    Active member of club or organization: Not on file    Attends meetings of clubs or organizations: Not on file    Relationship status: Not on file   Intimate partner violence    Fear of current or ex partner: Not on file    Emotionally abused: Not on file    Physically abused: Not on file    Forced sexual activity: Not on file  Other Topics Concern   Not on file  Social History Narrative   Work or School: Disabled seconndary to psychiatric conditions      Home Situation: lives alone with 2 cats and one dog      Spiritual Beliefs: Christian      Lifestyle: no regular exercise, diet  Not great - wants to embark on healthier lifestyle     Allergies  Allergen Reactions   Aspirin Other (See Comments)    discomfort   Codeine Itching   Sulfamethoxazole-Trimethoprim Other (See Comments)    Unknown reaction per pt   Trileptal [Oxcarbazepine]     itching   Trimethoprim Other (See Comments)    Unknown, per pt   Ultram [Tramadol] Itching   Vioxx [Rofecoxib]     Unknown per pt     Immunization History  Administered  Date(s) Administered   Influenza Split 04/26/2011   Influenza Whole  03/18/2009, 03/18/2010, 03/18/2016   Influenza,inj,Quad PF,6+ Mos 04/19/2014, 02/28/2017, 03/06/2018, 02/13/2019   Pneumococcal Conjugate-13 03/10/2015   Pneumococcal Polysaccharide-23 06/18/2005   Tdap 12/04/2016   Zoster Recombinat (Shingrix) 01/01/2018    Outpatient Medications Prior to Visit  Medication Sig Dispense Refill   Albuterol Sulfate (PROAIR RESPICLICK) 123XX123 (90 Base) MCG/ACT AEPB Inhale 2 puffs into the lungs every 6 (six) hours as needed. (Patient taking differently: Inhale 2 puffs into the lungs every 6 (six) hours as needed (For shortness of breath). ) 1 each 1   apixaban (ELIQUIS) 2.5 MG TABS tablet Take 1 tablet (2.5 mg total) by mouth 2 (two) times daily. 60 tablet 1   busPIRone (BUSPAR) 15 MG tablet Take 1 tablet (15 mg total) by mouth 3 (three) times daily. 270 tablet 0   cetirizine (ZYRTEC) 10 MG tablet Take 10 mg by mouth daily.     Cholecalciferol (VITAMIN D-3 PO) Take 5,000 Units by mouth daily.      colestipol (COLESTID) 1 g tablet TAKE 2 TABLETS BY MOUTH TWICE DAILY 120 tablet 0   diazepam (VALIUM) 5 MG tablet TAKE 1 TABLET BY MOUTH EVERY 8 HOURS AS NEEDED FOR ANXIETY (Patient taking differently: Take 5 mg by mouth every 8 (eight) hours as needed for anxiety. ) 90 tablet 2   doxazosin (CARDURA) 4 MG tablet Take 1.5 tablets (6 mg total) by mouth at bedtime. 135 tablet 1   doxycycline (VIBRA-TABS) 100 MG tablet Take 1 tablet (100 mg total) by mouth 2 (two) times daily for 14 days. 28 tablet 0   famotidine (PEPCID) 20 MG tablet Take 20 mg by mouth 2 (two) times daily.     fluticasone (FLONASE) 50 MCG/ACT nasal spray Use 2 spray(s) in each nostril once daily (Patient taking differently: Place 2 sprays into both nostrils daily. ) 16 g 3   fluticasone (FLOVENT HFA) 44 MCG/ACT inhaler Inhale 2 puffs into the lungs 2 (two) times a day. 3 Inhaler 1   mirtazapine (REMERON) 15 MG tablet  TAKE 1 TABLET BY MOUTH AT BEDTIME (Patient taking differently: Take 15 mg by mouth at bedtime. ) 30 tablet 2   montelukast (SINGULAIR) 10 MG tablet TAKE 1 TABLET BY MOUTH AT BEDTIME (Patient taking differently: Take 10 mg by mouth at bedtime. ) 90 tablet 0   No facility-administered medications prior to visit.     Review of Systems  Constitutional: Positive for fever.  HENT: Positive for congestion and nosebleeds.   Respiratory: Positive for cough and wheezing.      Objective:  There were no vitals filed for this visit.   on   RA BMI Readings from Last 3 Encounters:  05/08/19 31.74 kg/m  04/21/19 32.88 kg/m  01/22/19 33.63 kg/m   Wt Readings from Last 3 Encounters:  05/08/19 168 lb (76.2 kg)  04/21/19 174 lb (78.9 kg)  01/22/19 178 lb (80.7 kg)    Physical Exam-limited due to virtual visit  Speaking in full sentences without conversational dyspnea.  Very minimal coughing during the visit.  No audible wheezing.  Answering questions appropriately.  Normal speech.   CBC    Component Value Date/Time   WBC 4.0 05/12/2019 0745   RBC 4.38 05/12/2019 0745   HGB 13.3 05/12/2019 0745   HCT 40.2 05/12/2019 0745   PLT 231.0 05/12/2019 0745   MCV 91.9 05/12/2019 0745   MCH 29.2 05/02/2019 0252   MCHC 33.0 05/12/2019 0745   RDW 15.5 05/12/2019 0745   LYMPHSABS 0.8 05/12/2019 0745  MONOABS 0.4 05/12/2019 0745   EOSABS 0.2 05/12/2019 0745   BASOSABS 0.1 05/12/2019 0745     Chest Imaging- films reviewed: CXR, 2 view 05/12/2019-left greater than right opacities and reticulation.  Possible posterior pleural effusion silhouetting hemidiaphragm  CTA chest 04/22/2019-left greater than right peripheral groundglass opacities.  No PE.  Pulmonary Functions Testing Results: No flowsheet data found.      Assessment & Plan:     ICD-10-CM   1. Moderate persistent asthma with acute exacerbation  J45.41 albuterol (PROVENTIL) (2.5 MG/3ML) 0.083% nebulizer solution     mometasone-formoterol (DULERA) 100-5 MCG/ACT AERO  2. Acute respiratory failure with hypoxia (HCC)  J96.01   3. History of 2019 novel coronavirus disease (COVID-19)  Z86.19      Moderate persistent asthma -Would prefer to restart on LABA/ICS.  Will prescribe Dulera.  She will let us know if she is unable to afford this and we can determine if her insurance would cover another inhaler. -Once she starts her new inhaler, it is okay to stop Flovent. -Continue nebulizers as needed-refilled today -Continue Singulair -Up-to-date on seasonal flu vaccine -Will need repeat PFTs  History of COVID-19 viral pneumonia causing acute hypoxic respiratory failure -Continue supplemental oxygen overnight and during the day when needed.  If she is not wearing it she should remain vigilant with monitoring her saturations as she has been doing. -Likely will need overnight oximetry demonstrating resolution of hypoxia to be able to come off oxygen entirely -We will need high-resolution chest CT to evaluate for fibrotic lung disease post Covid  Allergic rhinosinusitis -Continue Flonase and Singulair   RTC in about 2 weeks for an office visit when her fevers have resolved.    Current Outpatient Medications:    albuterol (PROVENTIL) (2.5 MG/3ML) 0.083% nebulizer solution, Take 3 mLs (2.5 mg total) by nebulization every 6 (six) hours as needed for wheezing or shortness of breath., Disp: 75 mL, Rfl: 12   Albuterol Sulfate (PROAIR RESPICLICK) 123XX123 (90 Base) MCG/ACT AEPB, Inhale 2 puffs into the lungs every 6 (six) hours as needed. (Patient taking differently: Inhale 2 puffs into the lungs every 6 (six) hours as needed (For shortness of breath). ), Disp: 1 each, Rfl: 1   apixaban (ELIQUIS) 2.5 MG TABS tablet, Take 1 tablet (2.5 mg total) by mouth 2 (two) times daily., Disp: 60 tablet, Rfl: 1   busPIRone (BUSPAR) 15 MG tablet, Take 1 tablet (15 mg total) by mouth 3 (three) times daily., Disp: 270 tablet, Rfl: 0    cetirizine (ZYRTEC) 10 MG tablet, Take 10 mg by mouth daily., Disp: , Rfl:    Cholecalciferol (VITAMIN D-3 PO), Take 5,000 Units by mouth daily. , Disp: , Rfl:    colestipol (COLESTID) 1 g tablet, TAKE 2 TABLETS BY MOUTH TWICE DAILY, Disp: 120 tablet, Rfl: 0   diazepam (VALIUM) 5 MG tablet, TAKE 1 TABLET BY MOUTH EVERY 8 HOURS AS NEEDED FOR ANXIETY (Patient taking differently: Take 5 mg by mouth every 8 (eight) hours as needed for anxiety. ), Disp: 90 tablet, Rfl: 2   doxazosin (CARDURA) 4 MG tablet, Take 1.5 tablets (6 mg total) by mouth at bedtime., Disp: 135 tablet, Rfl: 1   doxycycline (VIBRA-TABS) 100 MG tablet, Take 1 tablet (100 mg total) by mouth 2 (two) times daily for 14 days., Disp: 28 tablet, Rfl: 0   famotidine (PEPCID) 20 MG tablet, Take 20 mg by mouth 2 (two) times daily., Disp: , Rfl:    fluticasone (FLONASE) 50 MCG/ACT nasal spray,  Use 2 spray(s) in each nostril once daily (Patient taking differently: Place 2 sprays into both nostrils daily. ), Disp: 16 g, Rfl: 3   fluticasone (FLOVENT HFA) 44 MCG/ACT inhaler, Inhale 2 puffs into the lungs 2 (two) times a day., Disp: 3 Inhaler, Rfl: 1   mirtazapine (REMERON) 15 MG tablet, TAKE 1 TABLET BY MOUTH AT BEDTIME (Patient taking differently: Take 15 mg by mouth at bedtime. ), Disp: 30 tablet, Rfl: 2   mometasone-formoterol (DULERA) 100-5 MCG/ACT AERO, Inhale 2 puffs into the lungs 2 (two) times daily., Disp: 13 g, Rfl: 11   montelukast (SINGULAIR) 10 MG tablet, TAKE 1 TABLET BY MOUTH AT BEDTIME (Patient taking differently: Take 10 mg by mouth at bedtime. ), Disp: 90 tablet, Rfl: 0   [name], DO [facility] 05/20/2019 5:43 PM

## 2019-05-20 NOTE — Telephone Encounter (Signed)
Mrs. Zale's visit today was a virtual visit due to having fevers.  With a fever she cannot come into the pulmonary office and would have been screened at the door.  I am happy to order a D-dimer, but until her symptoms are improved she will not be able to come to our office lab. Not sure if it would be easier to have this done at your office or at [place] for the inconvenience!  Ernest Mallick

## 2019-05-20 NOTE — Telephone Encounter (Signed)
Dr. Carlis Abbott - Dr. Ethlyn Gallery was wondering if you could please order a d-dimer for this pt? Thank you!

## 2019-05-20 NOTE — Telephone Encounter (Signed)
Spoke with patient and she reports she has not had any further nose bleeds. She does have some sinus pain/pressure, sore throat and ear pain. She is aware the doxycycline will help with any possible sinus infection. She is scheduled for virtual visit with Dr. W[place] Bing this afternoon, message sent asking if d-dimer can be ordered. She is aware to contact us if they are not able to order this.

## 2019-05-20 NOTE — Patient Instructions (Addendum)
Continue to avoid dairy  Continue Colestipol   Trial of doxycycline 100 mg twice a day x 14 days    Follow-up in 3 months, or earlier as needed

## 2019-05-20 NOTE — Telephone Encounter (Signed)
Spoke with the pt and scheduled video visit with Dr Carlis Abbott for today at 3:30

## 2019-05-20 NOTE — Patient Instructions (Addendum)
Thank you for visiting Dr. Carlis Abbott at [facility]. We recommend the following:   Meds ordered this encounter  Medications  . albuterol (PROVENTIL) (2.5 MG/3ML) 0.083% nebulizer solution    Sig: Take 3 mLs (2.5 mg total) by nebulization every 6 (six) hours as needed for wheezing or shortness of breath.    Dispense:  75 mL    Refill:  12  . mometasone-formoterol (DULERA) 100-5 MCG/ACT AERO    Sig: Inhale 2 puffs into the lungs 2 (two) times daily.    Dispense:  13 g    Refill:  11    Return in about 2 weeks (around 06/03/2019).    Please do your part to reduce the spread of COVID-19.

## 2019-05-20 NOTE — Telephone Encounter (Signed)
Spoken with pt. She is seeing Pulm today.

## 2019-05-20 NOTE — Telephone Encounter (Signed)
Pt has been advised.

## 2019-05-21 ENCOUNTER — Telehealth: Payer: Self-pay | Admitting: Critical Care Medicine

## 2019-05-21 DIAGNOSIS — J4541 Moderate persistent asthma with (acute) exacerbation: Secondary | ICD-10-CM

## 2019-05-21 MED ORDER — BUDESONIDE-FORMOTEROL FUMARATE 160-4.5 MCG/ACT IN AERO: 2.0000 | INHALATION_SPRAY | Freq: Two times a day (BID) | RESPIRATORY_TRACT | 11 refills | Status: DC

## 2019-05-21 NOTE — Telephone Encounter (Signed)
I let her PCP know yesterday that she was not a candidate to come into the building as she was having fevers, which was why her visit was a virtual visit.  She was recently hospitalized with COVID-19.  Based on our symptom screening, she was not able to come into the office building at all, so if that has not changed, she cannot come to our lab.  She should be 3 days fever- free before coming into the building, so no she cannot come tomorrow since she had fevers yesterday.  LPC

## 2019-05-21 NOTE — Telephone Encounter (Signed)
Sheena - forwarding to you in case fever is still present she won't be able to get this at our lab either. Please check with her and let her know about options. Thank you!

## 2019-05-21 NOTE — Telephone Encounter (Signed)
Called and spoke with pt letting her know the info stated by Dr. Carlis Abbott that we need her to be fever free for 3 days prior to her able to come in to our office for the [place]. Stated to her that [facility] called and spoke with PCP yesterday 12/2. Pt verbalized understanding and stated that she would call PCP tomorrow 12/4 to see if she can get labwork done at her office. Nothing further needed.

## 2019-05-21 NOTE — Telephone Encounter (Signed)
Called and spoke with pt letting her know Dr.Clark sent Rx for Symbicort to pharmacy in place of [facility] and pt verbalized understanding.  While speaking with pt, pt wanted to know if it is okay for her to come by office to have d-dimer lab done. Dr. Carlis Abbott, please advise if it is fine for pt to have labwork done tomorrow at office.

## 2019-05-21 NOTE — Telephone Encounter (Signed)
Spoke with pt and relayed message. She has been running a temp of 99-100 for the past few days. She does not feel well. She has continued sinus congestion and sore throat. She states Pulm rx'd inhaler for her but it is not on her formulary so they are working to find an alternative. She states all her recent meds have really placed a financial burden on her. I advised her to ask Pulm for samples and to also ask about patient assistance programs. I advised saline nasal spray for the sinus congestion and reviewed use. She voiced understanding.  Dr. Ethlyn Gallery - Please advise on pt being able to come in for labs given current symptoms. Thank you.

## 2019-05-21 NOTE — Telephone Encounter (Signed)
Inhaler that was prescribed for pt was [facility].  Called and spoke with pt who stated that Ruthe Mannan is not covered by insurance.  Asked pt which inhalers were covered by insurance and pt said that Symbicort, Anoro which is a tier 3, Breo, Combivent, Flovent, Serevent Diskus, and Trelegy which is a tier 3  Dr. Carlis Abbott please advise on a different inhaler for pt than the dulera. Thanks!

## 2019-05-22 ENCOUNTER — Other Ambulatory Visit: Payer: Self-pay

## 2019-05-22 ENCOUNTER — Other Ambulatory Visit: Payer: PPO

## 2019-05-22 DIAGNOSIS — R7989 Other specified abnormal findings of blood chemistry: Secondary | ICD-10-CM

## 2019-05-22 NOTE — Telephone Encounter (Signed)
As long as fever is less than 100.4 she can come here. We can put in [facility] consult (pharmacy consult) to help with medications! Please do this and see if they can assist with med costs. Let's try to see if she can get d dimer here today so we can have plan with blood thinner through weekend.

## 2019-05-22 NOTE — Telephone Encounter (Signed)
Spoke with pt and she states she has not had a fever in several days. She is not sure if she can get transportation to come in for labs today. She will check with her daughter and call us back to schedule for either today or Monday.

## 2019-05-23 LAB — D-DIMER, QUANTITATIVE: D-Dimer, Quant: 0.77 mcg/mL FEU — ABNORMAL HIGH (ref <0.50)

## 2019-05-25 ENCOUNTER — Encounter: Payer: Self-pay | Admitting: Family Medicine

## 2019-05-27 ENCOUNTER — Telehealth: Payer: Self-pay | Admitting: Critical Care Medicine

## 2019-05-27 NOTE — Telephone Encounter (Signed)
Medication name and strength: Dulera 110mcg Provider: [facility] Pharmacy: [facility] [address] Patient insurance ID: [id number] Phone:  Fax:   Was the PA started on CMM?  Yes If yes, please enter the Key: [id number] Timeframe for approval/denial: 1-3 business days

## 2019-05-28 NOTE — Telephone Encounter (Signed)
Received response from [email or web address]  PA has been approved from 05/27/19 to 06/17/20. Pharmacy is aware of approval.   Nothing further needed at time of call.

## 2019-06-05 ENCOUNTER — Other Ambulatory Visit: Payer: Self-pay | Admitting: Psychiatry

## 2019-06-05 DIAGNOSIS — F4310 Post-traumatic stress disorder, unspecified: Secondary | ICD-10-CM

## 2019-06-16 ENCOUNTER — Other Ambulatory Visit: Payer: Self-pay | Admitting: Psychiatry

## 2019-06-16 DIAGNOSIS — F4001 Agoraphobia with panic disorder: Secondary | ICD-10-CM

## 2019-06-17 ENCOUNTER — Ambulatory Visit: Payer: PPO | Admitting: Critical Care Medicine

## 2019-06-17 ENCOUNTER — Encounter: Payer: Self-pay | Admitting: Critical Care Medicine

## 2019-06-17 ENCOUNTER — Other Ambulatory Visit: Payer: Self-pay

## 2019-06-17 VITALS — BP 110/80 | HR 101 | Temp 98.2°F | Ht 61.5 in | Wt 178.6 lb

## 2019-06-17 DIAGNOSIS — J4541 Moderate persistent asthma with (acute) exacerbation: Secondary | ICD-10-CM | POA: Diagnosis not present

## 2019-06-17 DIAGNOSIS — J309 Allergic rhinitis, unspecified: Secondary | ICD-10-CM | POA: Diagnosis not present

## 2019-06-17 DIAGNOSIS — Z8616 Personal history of COVID-19: Secondary | ICD-10-CM

## 2019-06-17 DIAGNOSIS — Z8619 Personal history of other infectious and parasitic diseases: Secondary | ICD-10-CM

## 2019-06-17 MED ORDER — AZELASTINE & FLUTICASONE 137 & 50 MCG/ACT NA THPK: 2.0000 | PACK | NASAL | 11 refills | Status: DC

## 2019-06-17 NOTE — Progress Notes (Signed)
Synopsis: Referred in 2018 for asthma by Caren Macadam, MD.  Previously patient of Dr. Melvyn Novas and Dr. Gwenette Greet.   Subjective:   PATIENT ID: Brittany [name] GENDER: female DOB: 05-01-1956, MRN: [id number]  Chief Complaint  Patient presents with  . Follow-up    Patient is doing better since last visit. Has not needed oxygen in last 8 days. Patient has a cough with yellow sputum that started 2 days ago. Started using nebulizer yesterday with some relief.     Brittany [name] is a 63 year old woman with a history of asthma who presents for follow-up.  Her fevers she was having at her last visit resolved after a course of doxycycline.  She has been feeling well.  She has been off her oxygen for 8 days now, and has been monitoring her saturations at home.  Her dyspnea on exertion has been slowly improving, and she is getting faster and more independent with her ADLs.  For the past 2 days she is no longer needed to sit down while showering.  At her last visit we escalate her therapy from Flovent to [facility] twice daily.  She continues on Singulair, Zyrtec, and Flonase.  Overall she is doing well without wheezing or dyspnea on exertion.  Her cough is less than it was at her baseline prior to Covid.  She has had some yellow sputum for the last few days, but is not having more sputum than her usual or coughing more frequently.  Her baseline sputum is clear.  She has some ongoing allergies and postnasal drip with more rhinorrhea and sneezing the last few days.  She denies fever, chills, sweats.  She has pet cats, which she does not want to get rid of.  She knows she is allergic to them.  She continues to allow them to sleep in her bed.  She is to air purifiers in her house.  She is continue to avoid tobacco since quitting 30 years ago.       televisit 05/20/2019: Brittany [name] is a 63 year old woman who presents for follow-up.  She has a history of lifelong asthma.  She is recovering from recent COVID-19  viral pneumonia.  She was hospitalized at [facility] from November 3 to November 14.  She was discharged on home oxygen, which she has been using regularly.  She continues to use it at night, but is able to stay off of it sometimes during the day.  Currently her saturations are 93%.  While hospitalized she received steroids, remdesivir, convalescent plasma and Tocilizumab.  She was discharged on Eliquis for her high D-dimer, but did not have a confirmed DVT.  Her primary care physician is following her D-dimer to determine the appropriate time to stop Eliquis.  Since discharge she has had periodic epistaxis, which she has been managing with Neosporin on the inside her nose.  Her asthma was not fully controlled prior to her hospitalization, and it has been more noticeable since discharge.  It is improving; she has not used in a nebulizer at all today.  At first when she came home she was using them 3 times a day, was able to go down to twice a day.  She has had a fever since yesterday, and thinks that she may have a sinus infection.  She was recently prescribed doxycycline for small bowel bacterial overgrowth, but she is hoping this will help her sinuses.  She has chronic allergies to dust, mold, and cats.  She refuses to give  up her cats.  She follows with Dr. Fredderick Phenix from allergy and immunology.  She has care filters in her house to help with her allergies.  She is on Flovent twice daily with albuterol nebulizers.  She has been on Symbicort in the past, but had trouble affording this medication.  She has wheezing and shortness of breath.  Exercise triggers her asthma symptoms.      Flowsheet data:  Lab Results  Component Value Date   NITRICOXIDE 14 07/16/2016    Past Medical History:  Diagnosis Date  . Abnormal EKG   . Anxiety   . Asthma   . Bipolar depression ([place]), Anxiety, PTSD, Panic disorder    -managed by [facility]  . Depression   . Foot pain   . GERD (gastroesophageal  reflux disease)   . Heart murmur    as a child  . Insomnia   . Leg swelling   . Osteoporosis   . Tachycardia      Family History  Problem Relation Age of Onset  . Heart failure Mother   . Hypertension Mother   . Hyperlipidemia Mother   . Other Father        killed  . COPD Paternal Aunt        alot of aunts and uncle COPD or Emphysema  . Emphysema Paternal Uncle   . Colon polyps Maternal Aunt   . COPD Brother   . Heart disease Brother   . Pulmonary embolism Brother   . Breast cancer Neg Hx   . Colon cancer Neg Hx   . Esophageal cancer Neg Hx   . Pancreatic cancer Neg Hx   . Stomach cancer Neg Hx   . Liver disease Neg Hx      Past Surgical History:  Procedure Laterality Date  . CARPAL TUNNEL RELEASE Left   . CESAREAN SECTION     x 2  . CHOLECYSTECTOMY    . ENDOMETRIAL ABLATION    . KNEE SURGERY Right    reconstruction for patellar dislocation  . LAPAROSCOPY     x 2  . TUBAL LIGATION    . ULNAR NERVE REPAIR Left     Social History   Socioeconomic History  . Marital status: Single    Spouse name: Not on file  . Number of children: 3  . Years of education: Not on file  . Highest education level: Not on file  Occupational History  . Occupation: disabled  Tobacco Use  . Smoking status: Former Smoker    Packs/day: 20.00    Years: 1.00    Pack years: 20.00    Quit date: 06/19/1999    Years since quitting: 20.0  . Smokeless tobacco: Never Used  . Tobacco comment: 20 year estimate but probably less   Substance and Sexual Activity  . Alcohol use: Yes    Alcohol/week: 0.0 standard drinks    Comment: Occass.   . Drug use: No  . Sexual activity: Never  Other Topics Concern  . Not on file  Social History Narrative   Work or School: Disabled seconndary to psychiatric conditions      Home Situation: lives alone with 2 cats and one dog      Spiritual Beliefs: Christian      Lifestyle: no regular exercise, diet  Not great - wants to embark on healthier  lifestyle   Social Determinants of Health   Financial Resource Strain:   . Difficulty of Paying Living Expenses: Not on file  Food Insecurity:   . Worried About Charity fundraiser in the Last Year: Not on file  . Ran Out of Food in the Last Year: Not on file  Transportation Needs:   . Lack of Transportation (Medical): Not on file  . Lack of Transportation (Non-Medical): Not on file  Physical Activity:   . Days of Exercise per Week: Not on file  . Minutes of Exercise per Session: Not on file  Stress:   . Feeling of Stress : Not on file  Social Connections:   . Frequency of Communication with Friends and Family: Not on file  . Frequency of Social Gatherings with Friends and Family: Not on file  . Attends Religious Services: Not on file  . Active Member of Clubs or Organizations: Not on file  . Attends Archivist Meetings: Not on file  . Marital Status: Not on file  Intimate Partner Violence:   . Fear of Current or Ex-Partner: Not on file  . Emotionally Abused: Not on file  . Physically Abused: Not on file  . Sexually Abused: Not on file     Allergies  Allergen Reactions  . Aspirin Other (See Comments)    discomfort  . Codeine Itching  . Sulfamethoxazole-Trimethoprim Other (See Comments)    Unknown reaction per pt  . Trileptal [Oxcarbazepine]     itching  . Trimethoprim Other (See Comments)    Unknown, per pt  . Ultram [Tramadol] Itching  . Vioxx [Rofecoxib]     Unknown per pt     Immunization History  Administered Date(s) Administered  . Influenza Split 04/26/2011  . Influenza Whole 03/18/2009, 03/18/2010, 03/18/2016  . Influenza,inj,Quad PF,6+ Mos 04/19/2014, 02/28/2017, 03/06/2018, 02/13/2019  . Pneumococcal Conjugate-13 03/10/2015  . Pneumococcal Polysaccharide-23 06/18/2005  . Tdap 12/04/2016  . Zoster Recombinat (Shingrix) 01/01/2018, 06/22/2018    Outpatient Medications Prior to Visit  Medication Sig Dispense Refill  . albuterol (PROVENTIL)  (2.5 MG/3ML) 0.083% nebulizer solution Take 3 mLs (2.5 mg total) by nebulization every 6 (six) hours as needed for wheezing or shortness of breath. 75 mL 12  . Albuterol Sulfate (PROAIR RESPICLICK) 123XX123 (90 Base) MCG/ACT AEPB Inhale 2 puffs into the lungs every 6 (six) hours as needed. (Patient taking differently: Inhale 2 puffs into the lungs every 6 (six) hours as needed (For shortness of breath). ) 1 each 1  . budesonide-formoterol (SYMBICORT) 160-4.5 MCG/ACT inhaler Inhale 2 puffs into the lungs 2 (two) times daily. 1 Inhaler 11  . busPIRone (BUSPAR) 15 MG tablet TAKE 1 TABLET BY MOUTH THREE TIMES DAILY 270 tablet 0  . cetirizine (ZYRTEC) 10 MG tablet Take 10 mg by mouth daily.    . Cholecalciferol (VITAMIN D-3 PO) Take 5,000 Units by mouth daily.     . colestipol (COLESTID) 1 g tablet TAKE 2 TABLETS BY MOUTH TWICE DAILY 120 tablet 0  . diazepam (VALIUM) 5 MG tablet Take 1 tablet (5 mg total) by mouth every 8 (eight) hours as needed for anxiety. 90 tablet 2  . doxazosin (CARDURA) 4 MG tablet Take 1.5 tablets (6 mg total) by mouth at bedtime. 135 tablet 1  . famotidine (PEPCID) 20 MG tablet Take 20 mg by mouth 2 (two) times daily.    . fluticasone (FLONASE) 50 MCG/ACT nasal spray Use 2 spray(s) in each nostril once daily (Patient taking differently: Place 2 sprays into both nostrils daily. ) 16 g 3  . mirtazapine (REMERON) 15 MG tablet Take 1 tablet (15 mg total) by mouth  at bedtime. 30 tablet 2  . montelukast (SINGULAIR) 10 MG tablet TAKE 1 TABLET BY MOUTH AT BEDTIME (Patient taking differently: Take 10 mg by mouth at bedtime. ) 90 tablet 0  . apixaban (ELIQUIS) 2.5 MG TABS tablet Take 1 tablet (2.5 mg total) by mouth 2 (two) times daily. 60 tablet 1  . fluticasone (FLOVENT HFA) 44 MCG/ACT inhaler Inhale 2 puffs into the lungs 2 (two) times a day. 3 Inhaler 1  . mometasone-formoterol (DULERA) 100-5 MCG/ACT AERO Inhale 2 puffs into the lungs 2 (two) times daily. 13 g 11   No facility-administered  medications prior to visit.    Review of Systems  Constitutional: Positive for fever.  HENT: Positive for congestion and nosebleeds.   Respiratory: Positive for cough and wheezing.      Objective:   Vitals:   06/17/19 0946  BP: 110/80  Pulse: (!) 101  Temp: 98.2 F (36.8 C)  TempSrc: Temporal  SpO2: 95%  Weight: 178 lb 9.6 oz (81 kg)  Height: 5' 1.5" (1.562 m)   95% on  RA BMI Readings from Last 3 Encounters:  06/17/19 33.20 kg/m  05/08/19 31.74 kg/m  04/21/19 32.88 kg/m   Wt Readings from Last 3 Encounters:  06/17/19 178 lb 9.6 oz (81 kg)  05/08/19 168 lb (76.2 kg)  04/21/19 174 lb (78.9 kg)    Physical Exam Vitals reviewed.  Constitutional:      Appearance: Normal appearance. She is obese. She is not ill-appearing.  HENT:     Head: Normocephalic and atraumatic.     Nose:     Comments: Deferred due to masking requirement.    Mouth/Throat:     Comments: Deferred due to masking requirement. Eyes:     General: No scleral icterus. Cardiovascular:     Rate and Rhythm: Normal rate and regular rhythm.     Heart sounds: No murmur.  Pulmonary:     Comments: Breathing comfortably on room air, no conversational dyspnea.  No witnessed coughing.  Clear to auscultation bilaterally. Abdominal:     General: There is no distension.     Palpations: Abdomen is soft.     Tenderness: There is no abdominal tenderness.  Musculoskeletal:        General: No swelling or deformity.     Cervical back: Neck supple.  Lymphadenopathy:     Cervical: No cervical adenopathy.  Skin:    General: Skin is warm and dry.     Findings: No rash.  Neurological:     General: No focal deficit present.     Mental Status: She is alert.     Motor: No weakness.     Coordination: Coordination normal.  Psychiatric:        Mood and Affect: Mood normal.        Behavior: Behavior normal.      CBC    Component Value Date/Time   WBC 4.0 05/12/2019 0745   RBC 4.38 05/12/2019 0745   HGB  13.3 05/12/2019 0745   HCT 40.2 05/12/2019 0745   PLT 231.0 05/12/2019 0745   MCV 91.9 05/12/2019 0745   MCH 29.2 05/02/2019 0252   MCHC 33.0 05/12/2019 0745   RDW 15.5 05/12/2019 0745   LYMPHSABS 0.8 05/12/2019 0745   MONOABS 0.4 05/12/2019 0745   EOSABS 0.2 05/12/2019 0745   BASOSABS 0.1 05/12/2019 0745     Chest Imaging- films reviewed: CXR, 2 view 05/12/2019-left greater than right opacities and reticulation.  Possible posterior pleural effusion silhouetting hemidiaphragm  CTA chest 04/22/2019-left greater than right peripheral groundglass opacities.  No PE.  Pulmonary Functions Testing Results: No flowsheet data found.      Assessment & Plan:     ICD-10-CM   1. Moderate persistent asthma with acute exacerbation  J45.41 Azelastine & Fluticasone 137 & 50 MCG/ACT THPK    Pulmonary function test  2. History of 2019 novel coronavirus disease (COVID-19)  Z86.19 Pulmonary function test  3. Allergic rhinitis, unspecified seasonality, unspecified trigger  J30.9 Azelastine & Fluticasone 137 & 50 MCG/ACT THPK    Pulmonary function test     Moderate persistent asthma -Continue Dulera twice daily.  Continue rinse her mouth after use -Continue albuterol as needed -Continue Singulair -Up-to-date on seasonal flu vaccine.  Recommended Covid vaccination when it is available to her. -Continue COVID-19 precautions-social distancing, mask wearing, handwashing as repeat Covid infections have been described. -We will get follow-up PFTs on her new inhaler regimen  History of COVID-19 viral pneumonia causing acute hypoxic respiratory failure-resolved -Walked in the office today to monitor for desaturation (lowest 94%) -Okay to stop supplemental oxygen permanently  Allergic rhinosinusitis -Continue Zyrtec -Increasing intranasal therapy to azelastine and fluticasone combination.  Okay to stop Flonase when she starts this. -Recommended keeping her pets out of the bedroom, or at least  covering her pillows in the bed during the day to avoid cat dander being close to where she sleeps.   RTC in 3 months.    Current Outpatient Medications:  .  albuterol (PROVENTIL) (2.5 MG/3ML) 0.083% nebulizer solution, Take 3 mLs (2.5 mg total) by nebulization every 6 (six) hours as needed for wheezing or shortness of breath., Disp: 75 mL, Rfl: 12 .  Albuterol Sulfate (PROAIR RESPICLICK) 123XX123 (90 Base) MCG/ACT AEPB, Inhale 2 puffs into the lungs every 6 (six) hours as needed. (Patient taking differently: Inhale 2 puffs into the lungs every 6 (six) hours as needed (For shortness of breath). ), Disp: 1 each, Rfl: 1 .  budesonide-formoterol (SYMBICORT) 160-4.5 MCG/ACT inhaler, Inhale 2 puffs into the lungs 2 (two) times daily., Disp: 1 Inhaler, Rfl: 11 .  busPIRone (BUSPAR) 15 MG tablet, TAKE 1 TABLET BY MOUTH THREE TIMES DAILY, Disp: 270 tablet, Rfl: 0 .  cetirizine (ZYRTEC) 10 MG tablet, Take 10 mg by mouth daily., Disp: , Rfl:  .  Cholecalciferol (VITAMIN D-3 PO), Take 5,000 Units by mouth daily. , Disp: , Rfl:  .  colestipol (COLESTID) 1 g tablet, TAKE 2 TABLETS BY MOUTH TWICE DAILY, Disp: 120 tablet, Rfl: 0 .  diazepam (VALIUM) 5 MG tablet, Take 1 tablet (5 mg total) by mouth every 8 (eight) hours as needed for anxiety., Disp: 90 tablet, Rfl: 2 .  doxazosin (CARDURA) 4 MG tablet, Take 1.5 tablets (6 mg total) by mouth at bedtime., Disp: 135 tablet, Rfl: 1 .  famotidine (PEPCID) 20 MG tablet, Take 20 mg by mouth 2 (two) times daily., Disp: , Rfl:  .  fluticasone (FLONASE) 50 MCG/ACT nasal spray, Use 2 spray(s) in each nostril once daily (Patient taking differently: Place 2 sprays into both nostrils daily. ), Disp: 16 g, Rfl: 3 .  mirtazapine (REMERON) 15 MG tablet, Take 1 tablet (15 mg total) by mouth at bedtime., Disp: 30 tablet, Rfl: 2 .  montelukast (SINGULAIR) 10 MG tablet, TAKE 1 TABLET BY MOUTH AT BEDTIME (Patient taking differently: Take 10 mg by mouth at bedtime. ), Disp: 90 tablet, Rfl:  0 .  [START ON 06/18/2019] Azelastine & Fluticasone 137 & 50 MCG/ACT THPK, Place  2 sprays into the nose 2 (two) times a week., Disp: 1 each, Rfl: 11   [name], DO [facility] 06/17/2019 2:32 PM

## 2019-06-17 NOTE — Patient Instructions (Addendum)
Thank you for visiting Dr. Carlis Abbott at [facility]. We recommend the following:   Meds ordered this encounter  Medications  . Azelastine & Fluticasone 137 & 50 MCG/ACT THPK    Sig: Place 2 sprays into the nose 2 (two) times a week.    Dispense:  1 each    Refill:  11    Stop Flonase while using this nasal spray.    Return in about 3 months (around 09/15/2019).    Please do your part to reduce the spread of COVID-19.

## 2019-06-21 ENCOUNTER — Encounter: Payer: Self-pay | Admitting: Family Medicine

## 2019-06-22 NOTE — Telephone Encounter (Signed)
Do you know if we are able to just write letter for this or if they need rx to stop the oxygen (or if they have specific form for it). I am ok with stopping the oxygen at this point as she is maintaining her oxygen saturation. Please let me know what I need to do or if you are able to write letter that is fine too.

## 2019-06-23 ENCOUNTER — Encounter: Payer: Self-pay | Admitting: Family Medicine

## 2019-07-15 ENCOUNTER — Telehealth: Payer: Self-pay | Admitting: Psychiatry

## 2019-07-15 ENCOUNTER — Other Ambulatory Visit: Payer: Self-pay

## 2019-07-15 DIAGNOSIS — F4001 Agoraphobia with panic disorder: Secondary | ICD-10-CM

## 2019-07-15 DIAGNOSIS — F4310 Post-traumatic stress disorder, unspecified: Secondary | ICD-10-CM

## 2019-07-15 DIAGNOSIS — F515 Nightmare disorder: Secondary | ICD-10-CM

## 2019-07-15 DIAGNOSIS — F4312 Post-traumatic stress disorder, chronic: Secondary | ICD-10-CM

## 2019-07-15 MED ORDER — BUSPIRONE HCL 15 MG PO TABS: 15.0000 mg | ORAL_TABLET | Freq: Three times a day (TID) | ORAL | 0 refills | Status: DC

## 2019-07-15 MED ORDER — MIRTAZAPINE 15 MG PO TABS: 15.0000 mg | ORAL_TABLET | Freq: Every day | ORAL | 1 refills | Status: DC

## 2019-07-15 MED ORDER — DOXAZOSIN MESYLATE 4 MG PO TABS: 6.0000 mg | ORAL_TABLET | Freq: Every day | ORAL | 0 refills | Status: DC

## 2019-07-15 NOTE — Telephone Encounter (Signed)
Last refill for Valium 06/17/2019, apt 08/12/2019 Refills submitted to updated [facility]

## 2019-07-15 NOTE — Telephone Encounter (Signed)
Patient called and said  That she has changed pharmacies. [facility] now uses [facility] on [address] and needs a refill on all her medicines to be sent there

## 2019-07-16 ENCOUNTER — Telehealth: Payer: Self-pay | Admitting: Critical Care Medicine

## 2019-07-16 ENCOUNTER — Telehealth: Payer: Self-pay | Admitting: Gastroenterology

## 2019-07-16 DIAGNOSIS — J4541 Moderate persistent asthma with (acute) exacerbation: Secondary | ICD-10-CM

## 2019-07-16 MED ORDER — FAMOTIDINE 20 MG PO TABS: 20.0000 mg | ORAL_TABLET | Freq: Two times a day (BID) | ORAL | 1 refills | Status: DC

## 2019-07-16 MED ORDER — MONTELUKAST SODIUM 10 MG PO TABS: 10.0000 mg | ORAL_TABLET | Freq: Every day | ORAL | 1 refills | Status: DC

## 2019-07-16 MED ORDER — BUDESONIDE-FORMOTEROL FUMARATE 160-4.5 MCG/ACT IN AERO: 2.0000 | INHALATION_SPRAY | Freq: Two times a day (BID) | RESPIRATORY_TRACT | 11 refills | Status: DC

## 2019-07-16 MED ORDER — DIAZEPAM 5 MG PO TABS: 5.0000 mg | ORAL_TABLET | Freq: Three times a day (TID) | ORAL | 2 refills | Status: DC | PRN

## 2019-07-16 MED ORDER — COLESTIPOL HCL 1 G PO TABS: ORAL_TABLET | ORAL | 0 refills | Status: DC

## 2019-07-16 NOTE — Telephone Encounter (Signed)
Cetirizine daily (OTC med) Famotidine twice daily Floanse 2 sprays twice daily (OTC med)  LPC

## 2019-07-16 NOTE — Telephone Encounter (Signed)
Called and spoke with patient. Patient stated she is changing pharmacies and is needing new prescriptions sent in for symbicort 160, flonase, and montelukast.  Patient is changing to [facility].  Patient stated she is also needing refills for cetirizine and famotidine that was last refilled by allergist.  Patient stated she can not afford Azelastine & Flonase that was prescribed at last OV, but flonase was affordable.  Patient stated she was told to use Flonase 2 sprays, twice a day, but per epic flonase 2 sprays daily. Prescriptions for Symbicort and montelukast sent to requested pharmacy.  Message routed to Dr. Carlis Abbott to advise on famotidine,  cetirizine refills, and flonase directions.  Meds ordered this encounter  Medications  . Azelastine & Fluticasone 137 & 50 MCG/ACT THPK    Sig: Place 2 sprays into the nose 2 (two) times a week.    Dispense:  1 each    Refill:  11    Stop Flonase while using this nasal spray.    Return in about 3 months (around 09/15/2019).

## 2019-07-16 NOTE — Telephone Encounter (Signed)
Patient is calling for refill on Colestipol 1g states she is also changing pharmacies. This script should be going to [facility] [place] [phone].

## 2019-07-16 NOTE — Telephone Encounter (Signed)
Rx sent to pharmacy   

## 2019-07-16 NOTE — Telephone Encounter (Signed)
Called and spoke with Brittany [name].  Dr.Clark recommendations given.  Famotidine refill sent to requested pharmacy.  Brittany [name] stated she had to have certirizine and flonase through prescription,because the cost is $6.  Brittany [name] stated she can not afford to buy OTC for certirizine and flonase.  Brittany [name] stated if she did not get OTC meds through prescription, she will have to discontinue them.  Message routed to Dr. Carlis Abbott to advise on OTC prescription to pharmacy

## 2019-07-17 NOTE — Telephone Encounter (Signed)
Please prescribe them for her.  LPC

## 2019-07-20 MED ORDER — FLUTICASONE PROPIONATE 50 MCG/ACT NA SUSP: 2.0000 | Freq: Two times a day (BID) | NASAL | 5 refills | Status: DC

## 2019-07-20 MED ORDER — CETIRIZINE HCL 10 MG PO TABS: 10.0000 mg | ORAL_TABLET | Freq: Every day | ORAL | 1 refills | Status: AC

## 2019-07-20 NOTE — Telephone Encounter (Signed)
Requested prescriptions sent to [facility].  Patient aware of refills.  Nothing further at this time.

## 2019-08-12 ENCOUNTER — Ambulatory Visit (INDEPENDENT_AMBULATORY_CARE_PROVIDER_SITE_OTHER): Payer: PPO | Admitting: Psychiatry

## 2019-08-12 ENCOUNTER — Encounter: Payer: Self-pay | Admitting: Psychiatry

## 2019-08-12 DIAGNOSIS — F4001 Agoraphobia with panic disorder: Secondary | ICD-10-CM

## 2019-08-12 DIAGNOSIS — F4310 Post-traumatic stress disorder, unspecified: Secondary | ICD-10-CM

## 2019-08-12 DIAGNOSIS — F515 Nightmare disorder: Secondary | ICD-10-CM | POA: Diagnosis not present

## 2019-08-12 NOTE — Progress Notes (Signed)
Brittany [name]:[id number] 05-10-1956 64 y.o.   Virtual Visit via [place]  I connected with pt by WebEx and verified that I am speaking with the correct person using two identifiers.   I discussed the limitations, risks, security and privacy concerns of performing an evaluation and management service by Brittany [name] and the availability of in person appointments. I also discussed with the patient that there may be a patient responsible charge related to this service. The patient expressed understanding and agreed to proceed.  I discussed the assessment and treatment plan with the patient. The patient was provided an opportunity to ask questions and all were answered. The patient agreed with the plan and demonstrated an understanding of the instructions.   The patient was advised to call back or seek an in-person evaluation if the symptoms worsen or if the condition fails to improve as anticipated.  I provided 30 minutes of video time during this encounter. The call started at 1045 and ended at 11:15. The patient was located at home and the provider was located office.    Subjective:   Patient ID:  Brittany [name] is a 64 y.o. (DOB [birth year]) female.  Chief Complaint:  Chief Complaint  Patient presents with  . Follow-up    med management  . Anxiety  . Depression    Anxiety Symptoms include nervous/anxious behavior and shortness of breath. Patient reports no confusion, decreased concentration, dizziness or suicidal ideas.    Depression        Associated symptoms include fatigue.  Associated symptoms include no decreased concentration and no suicidal ideas.  Past medical history includes anxiety.    Brittany [name] presents to the office today for follow-up of long-term depression and anxiety and insomnia.    At visit September 08, 2018 at which time the patient was weaned off of gabapentin and started on Trileptal up to twice daily.  She has been very med sensitive and  difficult to treat because of that.  When seen September 19, 2018.  No meds were changed.  She stopped oxcarbazapine on her own DT itching.  seen July 2020.  She did not want to try any additional medications despite ongoing depression.  She wanted to stop lithium despite risk of worsening depression.  She was given instructions about how to do it in the safest manner possible.  Last seen April 17, 2019.  No meds were changed.  Still taking diazepam 5 TID, mirtazapine 15, doxazosin 6 mg HS  Weird dreams and occ awakens with panic.  Lost another "fur baby" that's 2 in 1 year.  $ and health stress.  Dreams are worse than last visit.  No SE.  Mirtazapine helps sleep generally and much better with it than without it.  Post Covid November 2020  problems with hair falling out.  Got significantly better in depression since here.  Prayed a lot about it and thought the lithium was helping.  Feels better now off it.  Was going through a lot at the time.  But after a month more peace and joy.  Anxiety is under control with diazepam 5 mg 1/2-1 BID plus prn CBD.  Buspirone helped.  Diazepam better than Xanax which she feels triggered dissociation and irritabilty.   Lost her dog which is hard.  Prayed and that helped.  Oldest GS joined special forces and gave her his car.    Worries about weight gain with meds.  Stopped therapy about September 2020.  Multiple med failures. ,  restoril, diazepam,  No more Xanax DT dissociation and irritability,  Trazodone NR, Ambien, hyrdroxyzine, doxazosin, propranolol, mirtazapine  sertraline, fluoxetine NR, Lexapro,  Buspar,  Latuda, Trileptal itching, lithium NR, Olanzapine, CBZ SE, Lamotrigine, risperidone with AE,  gabapentin, Seroquel SE, VPA, Geodon, Saphris, Abilify, Rexulti seemed to help for short while but $,  Review of Systems:  Review of Systems  Constitutional: Positive for fatigue. Negative for unexpected weight change.  HENT: Negative for congestion.    Respiratory: Positive for shortness of breath.   Musculoskeletal: Positive for back pain. Negative for gait problem.  Neurological: Negative for dizziness, tremors, weakness and light-headedness.  Psychiatric/Behavioral: Positive for depression and dysphoric mood. Negative for agitation, behavioral problems, confusion, decreased concentration, hallucinations, self-injury, sleep disturbance and suicidal ideas. The patient is nervous/anxious. The patient is not hyperactive.        Please refer to HPI  Stress eating.  Medications: I have reviewed the patient's current medications.  Current Outpatient Medications  Medication Sig Dispense Refill  . albuterol (PROVENTIL) (2.5 MG/3ML) 0.083% nebulizer solution Take 3 mLs (2.5 mg total) by nebulization every 6 (six) hours as needed for wheezing or shortness of breath. 75 mL 12  . Albuterol Sulfate (PROAIR RESPICLICK) 123XX123 (90 Base) MCG/ACT AEPB Inhale 2 puffs into the lungs every 6 (six) hours as needed. (Patient taking differently: Inhale 2 puffs into the lungs every 6 (six) hours as needed (For shortness of breath). ) 1 each 1  . Azelastine & Fluticasone 137 & 50 MCG/ACT THPK Place 2 sprays into the nose 2 (two) times a week. 1 each 11  . budesonide-formoterol (SYMBICORT) 160-4.5 MCG/ACT inhaler Inhale 2 puffs into the lungs 2 (two) times daily. 1 Inhaler 11  . busPIRone (BUSPAR) 15 MG tablet Take 1 tablet (15 mg total) by mouth 3 (three) times daily. 270 tablet 0  . cetirizine (ZYRTEC) 10 MG tablet Take 1 tablet (10 mg total) by mouth daily. 90 tablet 1  . Cholecalciferol (VITAMIN D-3 PO) Take 5,000 Units by mouth daily.     . colestipol (COLESTID) 1 g tablet TAKE 2 TABLETS BY MOUTH TWICE DAILY 120 tablet 0  . diazepam (VALIUM) 5 MG tablet Take 1 tablet (5 mg total) by mouth every 8 (eight) hours as needed for anxiety. 90 tablet 2  . doxazosin (CARDURA) 4 MG tablet Take 1.5 tablets (6 mg total) by mouth at bedtime. 135 tablet 0  . famotidine  (PEPCID) 20 MG tablet Take 1 tablet (20 mg total) by mouth 2 (two) times daily. 180 tablet 1  . fluticasone (FLONASE) 50 MCG/ACT nasal spray Place 2 sprays into both nostrils 2 (two) times daily. 16 g 5  . mirtazapine (REMERON) 15 MG tablet Take 1 tablet (15 mg total) by mouth at bedtime. 30 tablet 1  . montelukast (SINGULAIR) 10 MG tablet Take 1 tablet (10 mg total) by mouth at bedtime. 90 tablet 1   No current facility-administered medications for this visit.    Medication Side Effects: None  Allergies:  Allergies  Allergen Reactions  . Aspirin Other (See Comments)    discomfort  . Codeine Itching  . Sulfamethoxazole-Trimethoprim Other (See Comments)    Unknown reaction per pt  . Trileptal [Oxcarbazepine]     itching  . Trimethoprim Other (See Comments)    Unknown, per pt  . Ultram [Tramadol] Itching  . Vioxx [Rofecoxib]     Unknown per pt    Past Medical History:  Diagnosis Date  . Abnormal EKG   . Anxiety   .  Asthma   . Bipolar depression ([place]), Anxiety, PTSD, Panic disorder    -managed by [facility]  . Depression   . Foot pain   . GERD (gastroesophageal reflux disease)   . Heart murmur    as a child  . Insomnia   . Leg swelling   . Osteoporosis   . Tachycardia     Family History  Problem Relation Age of Onset  . Heart failure Mother   . Hypertension Mother   . Hyperlipidemia Mother   . Other Father        killed  . COPD Paternal Aunt        alot of aunts and uncle COPD or Emphysema  . Emphysema Paternal Uncle   . Colon polyps Maternal Aunt   . COPD Brother   . Heart disease Brother   . Pulmonary embolism Brother   . Breast cancer Neg Hx   . Colon cancer Neg Hx   . Esophageal cancer Neg Hx   . Pancreatic cancer Neg Hx   . Stomach cancer Neg Hx   . Liver disease Neg Hx     Social History   Socioeconomic History  . Marital status: Single    Spouse name: Not on file  . Number of children: 3  . Years of education: Not on file  .  Highest education level: Not on file  Occupational History  . Occupation: disabled  Tobacco Use  . Smoking status: Former Smoker    Packs/day: 20.00    Years: 1.00    Pack years: 20.00    Quit date: 06/19/1999    Years since quitting: 20.1  . Smokeless tobacco: Never Used  . Tobacco comment: 20 year estimate but probably less   Substance and Sexual Activity  . Alcohol use: Yes    Alcohol/week: 0.0 standard drinks    Comment: Occass.   . Drug use: No  . Sexual activity: Never  Other Topics Concern  . Not on file  Social History Narrative   Work or School: Disabled seconndary to psychiatric conditions      Home Situation: lives alone with 2 cats and one dog      Spiritual Beliefs: Christian      Lifestyle: no regular exercise, diet  Not great - wants to embark on healthier lifestyle   Social Determinants of Health   Financial Resource Strain:   . Difficulty of Paying Living Expenses: Not on file  Food Insecurity:   . Worried About Charity fundraiser in the Last Year: Not on file  . Ran Out of Food in the Last Year: Not on file  Transportation Needs:   . Lack of Transportation (Medical): Not on file  . Lack of Transportation (Non-Medical): Not on file  Physical Activity:   . Days of Exercise per Week: Not on file  . Minutes of Exercise per Session: Not on file  Stress:   . Feeling of Stress : Not on file  Social Connections:   . Frequency of Communication with Friends and Family: Not on file  . Frequency of Social Gatherings with Friends and Family: Not on file  . Attends Religious Services: Not on file  . Active Member of Clubs or Organizations: Not on file  . Attends Archivist Meetings: Not on file  . Marital Status: Not on file  Intimate Partner Violence:   . Fear of Current or Ex-Partner: Not on file  . Emotionally Abused: Not on file  . Physically  Abused: Not on file  . Sexually Abused: Not on file    Past Medical History, Surgical history, Social  history, and Family history were reviewed and updated as appropriate.   Please see review of systems for further details on the patient's review from today.   Objective:   Physical Exam:  There were no vitals taken for this visit.  Physical Exam Neurological:     Mental Status: She is alert and oriented to person, place, and time.     Cranial Nerves: No dysarthria.  Psychiatric:        Attention and Perception: Attention normal.        Mood and Affect: Mood is anxious. Mood is not depressed. Affect is not angry.        Speech: Speech normal. Speech is not rapid and pressured.        Behavior: Behavior is cooperative.        Thought Content: Thought content normal. Thought content is not paranoid or delusional. Thought content does not include homicidal or suicidal ideation. Thought content does not include homicidal or suicidal plan.        Cognition and Memory: Cognition and memory normal.     Comments: Insight and judgment fair.  not overtly manic.  Under more stress     Lab Review:     Component Value Date/Time   NA 142 05/12/2019 0745   K 4.7 05/12/2019 0745   CL 104 05/12/2019 0745   CO2 30 05/12/2019 0745   GLUCOSE 94 05/12/2019 0745   BUN 10 05/12/2019 0745   CREATININE 0.72 05/12/2019 0745   CALCIUM 9.1 05/12/2019 0745   PROT 5.7 (L) 05/12/2019 0745   ALBUMIN 3.5 05/12/2019 0745   AST 17 05/12/2019 0745   ALT 34 05/12/2019 0745   ALKPHOS 50 05/12/2019 0745   BILITOT 0.6 05/12/2019 0745   GFRNONAA >60 05/01/2019 0155   GFRAA >60 05/01/2019 0155       Component Value Date/Time   WBC 4.0 05/12/2019 0745   RBC 4.38 05/12/2019 0745   HGB 13.3 05/12/2019 0745   HCT 40.2 05/12/2019 0745   PLT 231.0 05/12/2019 0745   MCV 91.9 05/12/2019 0745   MCH 29.2 05/02/2019 0252   MCHC 33.0 05/12/2019 0745   RDW 15.5 05/12/2019 0745   LYMPHSABS 0.8 05/12/2019 0745   MONOABS 0.4 05/12/2019 0745   EOSABS 0.2 05/12/2019 0745   BASOSABS 0.1 05/12/2019 0745    No  results found for: POCLITH, LITHIUM   Lab Results  Component Value Date   VALPROATE 25.3 (L) 10/26/2008     .res Assessment: Plan:    Sharea was seen today for follow-up, anxiety and depression.  Diagnoses and all orders for this visit:  PTSD (post-traumatic stress disorder)  Nightmares associated with chronic post-traumatic stress disorder  Panic disorder with agoraphobia  Chronic noncompliance complicates treatment. Likey borderline personality disorder.  We discussed her chronic sx of depression, anxiety and insomnia combined with medication sensitivity which makes it very difficult to treat her.  Multiple med failures.  Poor insight into bipolar and refuses mood stabilizers.  This was discussed with her repeatedly. Guarded prognosis.  But her faith helps.  At the moment she reports improved mood and anxiety.  Except for situational stressors of losing 2 dogs in the last year and getting Covid and financial stress and isolation.   Disc risk mood swings without suitable mood stabilizer but she refuses any new meds.  She has not had any mood  swings off of mood stabilizer that have been recognized.  She understands this could still occur and is these can be delayed by many months or even a year or so after going off mood stabilizer  She remains at risk of mood swings as noted.  Consider Depakote.  Discussed the next option of   VPA.  She took  VPA 2010 but doesn't remember the effects.  Refuses.  We discussed the short-term risks associated with benzodiazepines including sedation and increased fall risk among others.  She reports better anxiety control with diazepam as opposed to Xanax as noted above.  She does not want to be prescribed Xanax at any point in the future.  Discussed long-term side effect risk including dependence, potential withdrawal symptoms, and the potential eventual dose-related risk of dementia.  Especially risky in PTSD bc of the high risk of tolerance in PTSD  patients.    Disc research showing high dosage gabapentin and Lyrica can sometimes help and has less risk of the above but her anxiety is better at the present with diazepam, buspirone, and CBD as needed.  She does not feel like she needs any med change for anxiety at this time..    NM worse again and not controlled with doxazosin.  Increase to 8 mg HS and disc SE and risk affecting blood pressure.    No med changes today.  Continue buspirone 15 mg 3 times daily, Valium 2.5 or 5 mg twice daily,  and mirtazapine 15 mg nightly for sleep.  FU 3-4 mos  Lynder Parents, MD, DFAPA   Please see After Visit Summary for patient specific instructions.  Future Appointments  Date Time Provider [place]  09/03/2019  9:00 AM [facility] PFT ROOM [facility] None  09/03/2019 10:00 AM Noemi Chapel P, DO [facility] None    No orders of the defined types were placed in this encounter.     -------------------------------

## 2019-08-31 ENCOUNTER — Other Ambulatory Visit (HOSPITAL_COMMUNITY)
Admission: RE | Admit: 2019-08-31 | Discharge: 2019-08-31 | Disposition: A | Discharge status: Home / Self Care | Payer: PPO | Source: Ambulatory Visit | Attending: Critical Care Medicine | Admitting: Critical Care Medicine

## 2019-08-31 DIAGNOSIS — Z01812 Encounter for preprocedural laboratory examination: Secondary | ICD-10-CM | POA: Diagnosis not present

## 2019-08-31 DIAGNOSIS — Z20822 Contact with and (suspected) exposure to covid-19: Secondary | ICD-10-CM | POA: Diagnosis not present

## 2019-08-31 LAB — SARS CORONAVIRUS 2 (TAT 6-24 HRS): SARS Coronavirus 2: NEGATIVE

## 2019-09-03 ENCOUNTER — Ambulatory Visit: Payer: PPO | Admitting: Critical Care Medicine

## 2019-09-03 ENCOUNTER — Other Ambulatory Visit: Payer: Self-pay

## 2019-09-03 ENCOUNTER — Ambulatory Visit: Payer: PPO

## 2019-09-03 ENCOUNTER — Encounter: Payer: Self-pay | Admitting: Critical Care Medicine

## 2019-09-03 ENCOUNTER — Ambulatory Visit (INDEPENDENT_AMBULATORY_CARE_PROVIDER_SITE_OTHER): Payer: PPO | Admitting: Critical Care Medicine

## 2019-09-03 VITALS — BP 120/70 | HR 92 | Temp 97.1°F | Ht 62.0 in | Wt 186.4 lb

## 2019-09-03 DIAGNOSIS — J309 Allergic rhinitis, unspecified: Secondary | ICD-10-CM | POA: Diagnosis not present

## 2019-09-03 DIAGNOSIS — Z8616 Personal history of COVID-19: Secondary | ICD-10-CM

## 2019-09-03 DIAGNOSIS — J4541 Moderate persistent asthma with (acute) exacerbation: Secondary | ICD-10-CM

## 2019-09-03 LAB — PULMONARY FUNCTION TEST
DL/VA % pred: 98 %
DL/VA: 4.17 ml/min/mmHg/L
DLCO cor % pred: 91 %
DLCO cor: 16.97 ml/min/mmHg
DLCO unc % pred: 91 %
DLCO unc: 16.97 ml/min/mmHg
FEF 25-75 Post: 1.21 L/sec
FEF 25-75 Pre: 1.14 L/sec
FEF2575-%Change-Post: 6 %
FEF2575-%Pred-Post: 58 %
FEF2575-%Pred-Pre: 55 %
FEV1-%Change-Post: 2 %
FEV1-%Pred-Post: 81 %
FEV1-%Pred-Pre: 78 %
FEV1-Post: 1.83 L
FEV1-Pre: 1.78 L
FEV1FVC-%Change-Post: -3 %
FEV1FVC-%Pred-Pre: 90 %
FEV6-%Change-Post: 6 %
FEV6-%Pred-Post: 95 %
FEV6-%Pred-Pre: 89 %
FEV6-Post: 2.7 L
FEV6-Pre: 2.54 L
FEV6FVC-%Change-Post: 0 %
FEV6FVC-%Pred-Post: 103 %
FEV6FVC-%Pred-Pre: 103 %
FVC-%Change-Post: 6 %
FVC-%Pred-Post: 91 %
FVC-%Pred-Pre: 86 %
FVC-Post: 2.71 L
FVC-Pre: 2.55 L
Post FEV1/FVC ratio: 67 %
Post FEV6/FVC ratio: 100 %
Pre FEV1/FVC ratio: 70 %
Pre FEV6/FVC Ratio: 100 %
RV % pred: 99 %
RV: 1.97 L
TLC % pred: 94 %
TLC: 4.48 L

## 2019-09-03 MED ORDER — FLUTICASONE FUROATE 27.5 MCG/SPRAY NA SUSP: 2.0000 | Freq: Two times a day (BID) | NASAL | 11 refills | Status: DC

## 2019-09-03 NOTE — Patient Instructions (Addendum)
Thank you for visiting Dr. Carlis Abbott at [facility]. We recommend the following:   Keep taking cetirizine and montelukast every day.  Keep taking flonase twice daily. Keep taking your inhaler two times daily every day.  Meds ordered this encounter  Medications  . fluticasone (VERAMYST) 27.5 MCG/SPRAY nasal spray    Sig: Place 2 sprays into the nose in the morning and at bedtime.    Dispense:  9.1 mL    Refill:  11    Return in about 6 months (around 03/05/2020).    Please do your part to reduce the spread of COVID-19.

## 2019-09-03 NOTE — Progress Notes (Signed)
PFT done today. 

## 2019-09-03 NOTE — Progress Notes (Signed)
Synopsis: Referred in 2018 for asthma by Caren Macadam, MD.  Previously patient of Dr. Melvyn Novas and Dr. Gwenette Greet.   Subjective:   PATIENT ID: Brittany [name] GENDER: female DOB: 1955-06-23, MRN: [id number]  Chief Complaint  Patient presents with  . Follow-up    Patient is here for ROV after PFT. Patient has been doing good since last visit states she is just having issues with her allergies. Flonase has helped a lot with the cough.     Brittany [name] is a 64 y/o woman following up for asthma and allergic rhinitis. She had an exacerbation of her asthma with persistent symptoms following Covid pneumonia in 2020.  Her symptoms have continued to improve.  Her allergies have been worse than normal recently due to pollen, but she is overall breathing better. Her asthma is better controlled-her cough and shortness of breath are both improved.  She is needing albuterol about once per month, less and less over time.  She sometimes feels this though she needs it during exercise.  She is continued on Symbicort twice daily, Singulair, Zyrtec, Flonase.  She was unable to afford azelastine nasal spray.  She has some ongoing throat congestion hoarseness after talking for a while that persists since she had Covid.    OV 06/17/2019: Brittany [name] is a 64 year old woman with a history of asthma who presents for follow-up.  Her fevers she was having at her last visit resolved after a course of doxycycline.  She has been feeling well.  She has been off her oxygen for 8 days now, and has been monitoring her saturations at home.  Her dyspnea on exertion has been slowly improving, and she is getting faster and more independent with her ADLs.  For the past 2 days she is no longer needed to sit down while showering.  At her last visit we escalate her therapy from Flovent to [facility] twice daily.  She continues on Singulair, Zyrtec, and Flonase.  Overall she is doing well without wheezing or dyspnea on exertion.  Her cough  is less than it was at her baseline prior to Covid.  She has had some yellow sputum for the last few days, but is not having more sputum than her usual or coughing more frequently.  Her baseline sputum is clear.  She has some ongoing allergies and postnasal drip with more rhinorrhea and sneezing the last few days.  She denies fever, chills, sweats.  She has pet cats, which she does not want to get rid of.  She knows she is allergic to them.  She continues to allow them to sleep in her bed.  She is to air purifiers in her house.  She is continue to avoid tobacco since quitting 30 years ago.    televisit 05/20/2019: Brittany [name] is a 64 year old woman who presents for follow-up.  She has a history of lifelong asthma.  She is recovering from recent COVID-19 viral pneumonia.  She was hospitalized at [facility] from November 3 to November 14.  She was discharged on home oxygen, which she has been using regularly.  She continues to use it at night, but is able to stay off of it sometimes during the day.  Currently her saturations are 93%.  While hospitalized she received steroids, remdesivir, convalescent plasma and Tocilizumab.  She was discharged on Eliquis for her high D-dimer, but did not have a confirmed DVT.  Her primary care physician is following her D-dimer to determine the appropriate time to  stop Eliquis.  Since discharge she has had periodic epistaxis, which she has been managing with Neosporin on the inside her nose.  Her asthma was not fully controlled prior to her hospitalization, and it has been more noticeable since discharge.  It is improving; she has not used in a nebulizer at all today.  At first when she came home she was using them 3 times a day, was able to go down to twice a day.  She has had a fever since yesterday, and thinks that she may have a sinus infection.  She was recently prescribed doxycycline for small bowel bacterial overgrowth, but she is hoping this will help her sinuses.  She  has chronic allergies to dust, mold, and cats.  She refuses to give up her cats.  She follows with Dr. Fredderick Phenix from allergy and immunology.  She has care filters in her house to help with her allergies.  She is on Flovent twice daily with albuterol nebulizers.  She has been on Symbicort in the past, but had trouble affording this medication.  She has wheezing and shortness of breath.  Exercise triggers her asthma symptoms.      Flowsheet data:  Lab Results  Component Value Date   NITRICOXIDE 14 07/16/2016    Past Medical History:  Diagnosis Date  . Abnormal EKG   . Anxiety   . Asthma   . Bipolar depression ([place]), Anxiety, PTSD, Panic disorder    -managed by [facility]  . Depression   . Foot pain   . GERD (gastroesophageal reflux disease)   . Heart murmur    as a child  . Insomnia   . Leg swelling   . Osteoporosis   . Tachycardia      Family History  Problem Relation Age of Onset  . Heart failure Mother   . Hypertension Mother   . Hyperlipidemia Mother   . Other Father        killed  . COPD Paternal Aunt        alot of aunts and uncle COPD or Emphysema  . Emphysema Paternal Uncle   . Colon polyps Maternal Aunt   . COPD Brother   . Heart disease Brother   . Pulmonary embolism Brother   . Breast cancer Neg Hx   . Colon cancer Neg Hx   . Esophageal cancer Neg Hx   . Pancreatic cancer Neg Hx   . Stomach cancer Neg Hx   . Liver disease Neg Hx      Past Surgical History:  Procedure Laterality Date  . CARPAL TUNNEL RELEASE Left   . CESAREAN SECTION     x 2  . CHOLECYSTECTOMY    . ENDOMETRIAL ABLATION    . KNEE SURGERY Right    reconstruction for patellar dislocation  . LAPAROSCOPY     x 2  . TUBAL LIGATION    . ULNAR NERVE REPAIR Left     Social History   Socioeconomic History  . Marital status: Single    Spouse name: Not on file  . Number of children: 3  . Years of education: Not on file  . Highest education level: Not on file   Occupational History  . Occupation: disabled  Tobacco Use  . Smoking status: Former Smoker    Packs/day: 20.00    Years: 1.00    Pack years: 20.00    Quit date: 06/19/1999    Years since quitting: 20.2  . Smokeless tobacco: Never Used  .  Tobacco comment: 20 year estimate but probably less   Substance and Sexual Activity  . Alcohol use: Yes    Alcohol/week: 0.0 standard drinks    Comment: Occass.   . Drug use: No  . Sexual activity: Never  Other Topics Concern  . Not on file  Social History Narrative   Work or School: Disabled seconndary to psychiatric conditions      Home Situation: lives alone with 2 cats and one dog      Spiritual Beliefs: Christian      Lifestyle: no regular exercise, diet  Not great - wants to embark on healthier lifestyle   Social Determinants of Health   Financial Resource Strain:   . Difficulty of Paying Living Expenses:   Food Insecurity:   . Worried About Charity fundraiser in the Last Year:   . Arboriculturist in the Last Year:   Transportation Needs:   . Film/video editor (Medical):   Marland Kitchen Lack of Transportation (Non-Medical):   Physical Activity:   . Days of Exercise per Week:   . Minutes of Exercise per Session:   Stress:   . Feeling of Stress :   Social Connections:   . Frequency of Communication with Friends and Family:   . Frequency of Social Gatherings with Friends and Family:   . Attends Religious Services:   . Active Member of Clubs or Organizations:   . Attends Archivist Meetings:   Marland Kitchen Marital Status:   Intimate Partner Violence:   . Fear of Current or Ex-Partner:   . Emotionally Abused:   Marland Kitchen Physically Abused:   . Sexually Abused:      Allergies  Allergen Reactions  . Aspirin Other (See Comments)    discomfort  . Codeine Itching  . Sulfamethoxazole-Trimethoprim Other (See Comments)    Unknown reaction per pt  . Trileptal [Oxcarbazepine]     itching  . Trimethoprim Other (See Comments)    Unknown, per  pt  . Ultram [Tramadol] Itching  . Vioxx [Rofecoxib]     Unknown per pt     Immunization History  Administered Date(s) Administered  . Influenza Split 04/26/2011  . Influenza Whole 03/18/2009, 03/18/2010, 03/18/2016  . Influenza,inj,Quad PF,6+ Mos 04/19/2014, 02/28/2017, 03/06/2018, 02/13/2019  . Pneumococcal Conjugate-13 03/10/2015  . Pneumococcal Polysaccharide-23 06/18/2005  . Tdap 12/04/2016  . Zoster Recombinat (Shingrix) 01/01/2018, 06/22/2018    Outpatient Medications Prior to Visit  Medication Sig Dispense Refill  . albuterol (PROVENTIL) (2.5 MG/3ML) 0.083% nebulizer solution Take 3 mLs (2.5 mg total) by nebulization every 6 (six) hours as needed for wheezing or shortness of breath. 75 mL 12  . Albuterol Sulfate (PROAIR RESPICLICK) 123XX123 (90 Base) MCG/ACT AEPB Inhale 2 puffs into the lungs every 6 (six) hours as needed. (Patient taking differently: Inhale 2 puffs into the lungs every 6 (six) hours as needed (For shortness of breath). ) 1 each 1  . Azelastine & Fluticasone 137 & 50 MCG/ACT THPK Place 2 sprays into the nose 2 (two) times a week. 1 each 11  . budesonide-formoterol (SYMBICORT) 160-4.5 MCG/ACT inhaler Inhale 2 puffs into the lungs 2 (two) times daily. 1 Inhaler 11  . busPIRone (BUSPAR) 15 MG tablet Take 1 tablet (15 mg total) by mouth 3 (three) times daily. 270 tablet 0  . cetirizine (ZYRTEC) 10 MG tablet Take 1 tablet (10 mg total) by mouth daily. 90 tablet 1  . Cholecalciferol (VITAMIN D-3 PO) Take 5,000 Units by mouth daily.     Marland Kitchen  colestipol (COLESTID) 1 g tablet TAKE 2 TABLETS BY MOUTH TWICE DAILY 120 tablet 0  . diazepam (VALIUM) 5 MG tablet Take 1 tablet (5 mg total) by mouth every 8 (eight) hours as needed for anxiety. 90 tablet 2  . doxazosin (CARDURA) 4 MG tablet Take 1.5 tablets (6 mg total) by mouth at bedtime. (Patient taking differently: Take 8 mg by mouth at bedtime. ) 135 tablet 0  . famotidine (PEPCID) 20 MG tablet Take 1 tablet (20 mg total) by mouth 2  (two) times daily. 180 tablet 1  . fluticasone (FLONASE) 50 MCG/ACT nasal spray Place 2 sprays into both nostrils 2 (two) times daily. 16 g 5  . mirtazapine (REMERON) 15 MG tablet Take 1 tablet (15 mg total) by mouth at bedtime. 30 tablet 1  . montelukast (SINGULAIR) 10 MG tablet Take 1 tablet (10 mg total) by mouth at bedtime. 90 tablet 1   No facility-administered medications prior to visit.    Review of Systems  Constitutional: Positive for fever.  HENT: Positive for congestion and nosebleeds.   Respiratory: Positive for cough and wheezing.      Objective:   Vitals:   09/03/19 0955  BP: 120/70  Pulse: 92  Temp: (!) 97.1 F (36.2 C)  TempSrc: Temporal  SpO2: 98%  Weight: 186 lb 6.4 oz (84.6 kg)  Height: 5\' 2"  (1.575 m)   98% on  RA BMI Readings from Last 3 Encounters:  09/03/19 34.09 kg/m  06/17/19 33.20 kg/m  05/08/19 31.74 kg/m   Wt Readings from Last 3 Encounters:  09/03/19 186 lb 6.4 oz (84.6 kg)  06/17/19 178 lb 9.6 oz (81 kg)  05/08/19 168 lb (76.2 kg)    Physical Exam Vitals reviewed.  Constitutional:      Appearance: She is obese. She is not ill-appearing.  HENT:     Head: Normocephalic and atraumatic.  Eyes:     General: No scleral icterus. Cardiovascular:     Rate and Rhythm: Normal rate and regular rhythm.     Heart sounds: No murmur.  Pulmonary:     Comments: CTAB, breathing comfortably on RA. No conversational dyspnea. Abdominal:     General: There is no distension.     Palpations: Abdomen is soft.  Musculoskeletal:        General: No swelling or deformity.     Cervical back: Neck supple.  Lymphadenopathy:     Cervical: No cervical adenopathy.  Skin:    General: Skin is warm and dry.     Findings: No rash.  Neurological:     General: No focal deficit present.     Mental Status: She is alert.     Coordination: Coordination normal.  Psychiatric:        Mood and Affect: Mood normal.        Behavior: Behavior normal.      CBC     Component Value Date/Time   WBC 4.0 05/12/2019 0745   RBC 4.38 05/12/2019 0745   HGB 13.3 05/12/2019 0745   HCT 40.2 05/12/2019 0745   PLT 231.0 05/12/2019 0745   MCV 91.9 05/12/2019 0745   MCH 29.2 05/02/2019 0252   MCHC 33.0 05/12/2019 0745   RDW 15.5 05/12/2019 0745   LYMPHSABS 0.8 05/12/2019 0745   MONOABS 0.4 05/12/2019 0745   EOSABS 0.2 05/12/2019 0745   BASOSABS 0.1 05/12/2019 0745     Chest Imaging- films reviewed: CXR, 2 view 05/12/2019-left greater than right opacities and reticulation.  Possible posterior pleural  effusion silhouetting hemidiaphragm  CTA chest 04/22/2019-left greater than right peripheral groundglass opacities.  No PE.  Pulmonary Functions Testing Results: PFT Results Latest Ref Rng & Units 09/03/2019  FVC-Pre L 2.55  FVC-Predicted Pre % 86  FVC-Post L 2.71  FVC-Predicted Post % 91  Pre FEV1/FVC % % 70  Post FEV1/FCV % % 67  FEV1-Pre L 1.78  FEV1-Predicted Pre % 78  FEV1-Post L 1.83  DLCO UNC% % 91  DLCO COR %Predicted % 98  TLC L 4.48  TLC % Predicted % 94  RV % Predicted % 99   2021-no significant obstruction or bronchodilator reversibility.  No restriction, hyperinflation, or air trapping.  Normal diffusion.     Assessment & Plan:     ICD-10-CM   1. Moderate persistent asthma with acute exacerbation  J45.41   2. Allergic rhinitis, unspecified seasonality, unspecified trigger  J30.9      Moderate persistent asthma, currently well controlled. -Continue Symbicort twice daily.  Continue rinse her mouth after every use. -Continue albuterol as needed -Continue Singulair -Up-to-date on seasonal flu vaccine.  Recommended Covid vaccination when it is available to her.  She is planning on receiving it in the next few weeks. -Reviewed PFTs today.  History of COVID-19 viral pneumonia causing acute hypoxic respiratory failure-resolved -Walked in the office today to monitor for desaturation (lowest 94%) -Okay to stop supplemental oxygen  permanently  Allergic rhinosinusitis -Continue Zyrtec and Flonase   RTC in 6 months.    Current Outpatient Medications:  .  albuterol (PROVENTIL) (2.5 MG/3ML) 0.083% nebulizer solution, Take 3 mLs (2.5 mg total) by nebulization every 6 (six) hours as needed for wheezing or shortness of breath., Disp: 75 mL, Rfl: 12 .  Albuterol Sulfate (PROAIR RESPICLICK) 123XX123 (90 Base) MCG/ACT AEPB, Inhale 2 puffs into the lungs every 6 (six) hours as needed. (Patient taking differently: Inhale 2 puffs into the lungs every 6 (six) hours as needed (For shortness of breath). ), Disp: 1 each, Rfl: 1 .  Azelastine & Fluticasone 137 & 50 MCG/ACT THPK, Place 2 sprays into the nose 2 (two) times a week., Disp: 1 each, Rfl: 11 .  budesonide-formoterol (SYMBICORT) 160-4.5 MCG/ACT inhaler, Inhale 2 puffs into the lungs 2 (two) times daily., Disp: 1 Inhaler, Rfl: 11 .  busPIRone (BUSPAR) 15 MG tablet, Take 1 tablet (15 mg total) by mouth 3 (three) times daily., Disp: 270 tablet, Rfl: 0 .  cetirizine (ZYRTEC) 10 MG tablet, Take 1 tablet (10 mg total) by mouth daily., Disp: 90 tablet, Rfl: 1 .  Cholecalciferol (VITAMIN D-3 PO), Take 5,000 Units by mouth daily. , Disp: , Rfl:  .  colestipol (COLESTID) 1 g tablet, TAKE 2 TABLETS BY MOUTH TWICE DAILY, Disp: 120 tablet, Rfl: 0 .  diazepam (VALIUM) 5 MG tablet, Take 1 tablet (5 mg total) by mouth every 8 (eight) hours as needed for anxiety., Disp: 90 tablet, Rfl: 2 .  doxazosin (CARDURA) 4 MG tablet, Take 1.5 tablets (6 mg total) by mouth at bedtime. (Patient taking differently: Take 8 mg by mouth at bedtime. ), Disp: 135 tablet, Rfl: 0 .  famotidine (PEPCID) 20 MG tablet, Take 1 tablet (20 mg total) by mouth 2 (two) times daily., Disp: 180 tablet, Rfl: 1 .  fluticasone (FLONASE) 50 MCG/ACT nasal spray, Place 2 sprays into both nostrils 2 (two) times daily., Disp: 16 g, Rfl: 5 .  mirtazapine (REMERON) 15 MG tablet, Take 1 tablet (15 mg total) by mouth at bedtime., Disp: 30  tablet, Rfl:  1 .  montelukast (SINGULAIR) 10 MG tablet, Take 1 tablet (10 mg total) by mouth at bedtime., Disp: 90 tablet, Rfl: 1   [name], DO [facility] 09/03/2019 10:16 AM

## 2019-09-04 ENCOUNTER — Ambulatory Visit: Payer: PPO | Attending: Internal Medicine

## 2019-09-04 DIAGNOSIS — Z23 Encounter for immunization: Secondary | ICD-10-CM

## 2019-09-04 NOTE — Progress Notes (Signed)
   Covid-19 Vaccination Clinic  Name:  Brittany [name]    MRN: [id number] DOB: 03-Mar-1956  09/04/2019  Brittany [name] was observed post Covid-19 immunization for 15 minutes without incident. She was provided with Vaccine Information Sheet and instruction to access the V-Safe system.   Brittany [name] was instructed to call 911 with any severe reactions post vaccine: Marland Kitchen Difficulty breathing  . Swelling of face and throat  . A fast heartbeat  . A bad rash all over body  . Dizziness and weakness   Immunizations Administered    Name Date Dose VIS Date Route   Pfizer COVID-19 Vaccine 09/04/2019 10:24 AM 0.3 mL 05/29/2019 Intramuscular   Manufacturer: [place]   Lot: [id number]   [place]: [id number]

## 2019-09-07 ENCOUNTER — Telehealth: Payer: Self-pay | Admitting: Psychiatry

## 2019-09-07 MED ORDER — COLESTIPOL HCL 1 G PO TABS: 1.0000 g | ORAL_TABLET | Freq: Two times a day (BID) | ORAL | 2 refills | Status: DC

## 2019-09-07 NOTE — Telephone Encounter (Signed)
Pt requested refills for colestipol.  She inquired why her rx does not have refills.

## 2019-09-07 NOTE — Telephone Encounter (Signed)
Rx has been sent to pharmacy

## 2019-09-07 NOTE — Telephone Encounter (Signed)
Last refill 08/17/2019 for #90 Valium for 30 day supply

## 2019-09-07 NOTE — Telephone Encounter (Signed)
Pt called to request refills for 90 day if possible on all meds @ [facility] on file location.Buprion 15 mg, Mirtazapine 15 mg & Diazepam 5 mg. Must cheaper and more convenient for patient .

## 2019-09-07 NOTE — Addendum Note (Signed)
Addended by: Wyline Beady on: 09/07/2019 12:57 PM   Modules accepted: Orders

## 2019-09-09 ENCOUNTER — Other Ambulatory Visit: Payer: Self-pay

## 2019-09-09 DIAGNOSIS — F4310 Post-traumatic stress disorder, unspecified: Secondary | ICD-10-CM

## 2019-09-09 DIAGNOSIS — F4001 Agoraphobia with panic disorder: Secondary | ICD-10-CM

## 2019-09-09 MED ORDER — BUSPIRONE HCL 15 MG PO TABS: 15.0000 mg | ORAL_TABLET | Freq: Three times a day (TID) | ORAL | 0 refills | Status: DC

## 2019-09-09 MED ORDER — MIRTAZAPINE 15 MG PO TABS: 15.0000 mg | ORAL_TABLET | Freq: Every day | ORAL | 0 refills | Status: DC

## 2019-09-09 MED ORDER — DIAZEPAM 5 MG PO TABS: 5.0000 mg | ORAL_TABLET | Freq: Three times a day (TID) | ORAL | 2 refills | Status: DC | PRN

## 2019-09-09 NOTE — Telephone Encounter (Signed)
RX for Buspirone 15 mg #270 and Mirtazapine 15 mg #90 submitted to [facility].  Pended Valium for Dr. Clovis Pu to submit

## 2019-09-17 ENCOUNTER — Telehealth: Payer: Self-pay | Admitting: Family Medicine

## 2019-09-17 NOTE — Telephone Encounter (Signed)
Pt is requesting referral to her cardiologist.   Dr. Jenkins Rouge   Address: [address] [place], [place] [address]   [phone]  Pt also would like to see how she would go about seeing a nutritionist

## 2019-09-21 ENCOUNTER — Other Ambulatory Visit: Payer: Self-pay | Admitting: Family Medicine

## 2019-09-21 DIAGNOSIS — E785 Hyperlipidemia, unspecified: Secondary | ICD-10-CM

## 2019-09-21 DIAGNOSIS — E6609 Other obesity due to excess calories: Secondary | ICD-10-CM

## 2019-09-21 DIAGNOSIS — I4510 Unspecified right bundle-branch block: Secondary | ICD-10-CM

## 2019-09-21 NOTE — Telephone Encounter (Signed)
Spoke with the pt and informed her of the message below.   

## 2019-09-21 NOTE — Telephone Encounter (Signed)
I put in referral for Dr. Johnsie Cancel and for nutritionist. She should hear from both regarding scheduling in next couple of weeks.

## 2019-09-25 ENCOUNTER — Ambulatory Visit: Payer: PPO | Attending: Internal Medicine

## 2019-09-25 ENCOUNTER — Other Ambulatory Visit: Payer: Self-pay

## 2019-09-25 DIAGNOSIS — Z20822 Contact with and (suspected) exposure to covid-19: Secondary | ICD-10-CM | POA: Diagnosis not present

## 2019-09-26 ENCOUNTER — Other Ambulatory Visit: Payer: PPO

## 2019-09-26 LAB — SARS-COV-2, NAA 2 DAY TAT

## 2019-09-26 LAB — NOVEL CORONAVIRUS, NAA: SARS-CoV-2, NAA: NOT DETECTED

## 2019-09-29 ENCOUNTER — Ambulatory Visit: Payer: PPO | Attending: Internal Medicine

## 2019-09-29 DIAGNOSIS — Z23 Encounter for immunization: Secondary | ICD-10-CM

## 2019-09-29 NOTE — Progress Notes (Signed)
   Covid-19 Vaccination Clinic  Name:  Brittany [name]    MRN: [id number] DOB: 1955-08-20  09/29/2019  Brittany [name] was observed post Covid-19 immunization for 15 minutes without incident. She was provided with Vaccine Information Sheet and instruction to access the V-Safe system.   Brittany [name] was instructed to call 911 with any severe reactions post vaccine: Marland Kitchen Difficulty breathing  . Swelling of face and throat  . A fast heartbeat  . A bad rash all over body  . Dizziness and weakness   Immunizations Administered    Name Date Dose VIS Date Route   Pfizer COVID-19 Vaccine 09/29/2019 11:21 AM 0.3 mL 05/29/2019 Intramuscular   Manufacturer: [place]   Lot: [id number]   [place]: [id number]

## 2019-10-08 ENCOUNTER — Other Ambulatory Visit: Payer: Self-pay

## 2019-10-08 ENCOUNTER — Telehealth: Payer: Self-pay | Admitting: Psychiatry

## 2019-10-08 DIAGNOSIS — F4310 Post-traumatic stress disorder, unspecified: Secondary | ICD-10-CM

## 2019-10-08 DIAGNOSIS — F515 Nightmare disorder: Secondary | ICD-10-CM

## 2019-10-08 MED ORDER — DOXAZOSIN MESYLATE 4 MG PO TABS: 8.0000 mg | ORAL_TABLET | Freq: Every day | ORAL | 0 refills | Status: DC

## 2019-10-08 NOTE — Telephone Encounter (Signed)
Patient called and said that she needs a refill on her doxazosin 8 mg. Dr. Clovis Pu told her to call and increase it to 8 mg. Sent to the [facility] at [address]

## 2019-10-08 NOTE — Telephone Encounter (Signed)
RX for Doxazosin 4 mg take 2 tablets at hs #180 submitted to [facility]

## 2019-10-13 NOTE — Telephone Encounter (Signed)
error 

## 2019-10-16 ENCOUNTER — Encounter: Payer: Self-pay | Admitting: Psychiatry

## 2019-10-16 ENCOUNTER — Ambulatory Visit (INDEPENDENT_AMBULATORY_CARE_PROVIDER_SITE_OTHER): Payer: PPO | Admitting: Psychiatry

## 2019-10-16 DIAGNOSIS — F4310 Post-traumatic stress disorder, unspecified: Secondary | ICD-10-CM | POA: Diagnosis not present

## 2019-10-16 DIAGNOSIS — F4312 Post-traumatic stress disorder, chronic: Secondary | ICD-10-CM

## 2019-10-16 DIAGNOSIS — F4001 Agoraphobia with panic disorder: Secondary | ICD-10-CM

## 2019-10-16 DIAGNOSIS — F3162 Bipolar disorder, current episode mixed, moderate: Secondary | ICD-10-CM

## 2019-10-16 DIAGNOSIS — F515 Nightmare disorder: Secondary | ICD-10-CM | POA: Diagnosis not present

## 2019-10-16 MED ORDER — DIAZEPAM 5 MG PO TABS: 5.0000 mg | ORAL_TABLET | Freq: Four times a day (QID) | ORAL | 3 refills | Status: DC | PRN
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
## 2019-10-21 ENCOUNTER — Telehealth: Payer: Self-pay | Admitting: Gastroenterology

## 2019-10-21 MED ORDER — DOXYCYCLINE MONOHYDRATE 100 MG PO TABS: 100.0000 mg | ORAL_TABLET | Freq: Two times a day (BID) | ORAL | 0 refills | Status: DC

## 2019-10-21 NOTE — Telephone Encounter (Signed)
Pt aware, script sent to pharmacy. Pt already has OV scheduled.

## 2019-10-21 NOTE — Telephone Encounter (Signed)
May repeat doxycycline 100 mg BID x 14 days. Needs follow-up with me or APP. Thanks.

## 2019-10-21 NOTE — Telephone Encounter (Signed)
Pt states she is having issues with explosive diarrhea and lots of gas. She feels this is related to SIBO as she had in the past. Pt is requesting doxycycline be called in for her. States this antibiotic she can afford, she could not the xifaxan. Please advise.

## 2019-10-28 ENCOUNTER — Telehealth: Payer: Self-pay | Admitting: Psychiatry
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
## 2019-11-02 NOTE — Telephone Encounter (Signed)
I am not going to provide a letter over ruling the apartment complex rules about parking.

## 2019-11-10 ENCOUNTER — Other Ambulatory Visit: Payer: Self-pay | Admitting: *Deleted

## 2019-11-10 MED ORDER — FLUTICASONE PROPIONATE 50 MCG/ACT NA SUSP: 2.0000 | Freq: Two times a day (BID) | NASAL | 0 refills | Status: DC

## 2019-11-11 DIAGNOSIS — M542 Cervicalgia: Secondary | ICD-10-CM | POA: Diagnosis not present

## 2019-11-23 ENCOUNTER — Ambulatory Visit: Payer: PPO | Admitting: Cardiovascular Disease

## 2019-11-24 ENCOUNTER — Telehealth (INDEPENDENT_AMBULATORY_CARE_PROVIDER_SITE_OTHER): Payer: PPO | Admitting: Gastroenterology

## 2019-11-24 ENCOUNTER — Encounter: Payer: Self-pay | Admitting: Gastroenterology

## 2019-11-24 DIAGNOSIS — E739 Lactose intolerance, unspecified: Secondary | ICD-10-CM | POA: Diagnosis not present

## 2019-11-24 DIAGNOSIS — K219 Gastro-esophageal reflux disease without esophagitis: Secondary | ICD-10-CM | POA: Diagnosis not present

## 2019-11-24 DIAGNOSIS — R197 Diarrhea, unspecified: Secondary | ICD-10-CM | POA: Diagnosis not present

## 2019-11-24 DIAGNOSIS — R140 Abdominal distension (gaseous): Secondary | ICD-10-CM | POA: Diagnosis not present

## 2019-11-24 DIAGNOSIS — K6389 Other specified diseases of intestine: Secondary | ICD-10-CM | POA: Diagnosis not present

## 2019-11-24 MED ORDER — DICYCLOMINE HCL 20 MG PO TABS: 20.0000 mg | ORAL_TABLET | Freq: Four times a day (QID) | ORAL | 3 refills | Status: DC | PRN

## 2019-11-24 NOTE — Progress Notes (Signed)
TELEHEALTH VISIT  Referring Provider: Caren Macadam, MD Primary Care Physician:  Caren Macadam, MD   Tele-visit due to COVID-19 pandemic Patient requested visit virtually, consented to the virtual encounter via video enabled telemedicine application (EPIC) Contact made at: 11/24/19 10:15 Patient verified by name and date of birth Location of patient: Home Location provider: [facility] Names of persons participating: Me, patient Time spent on telehealth visit: 26 minutes I discussed the limitations of evaluation and management by telemedicine. The patient expressed understanding and agreed to proceed.  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 Collene Mares in 2009, treated with Align    - Normal duodenal and colon biopsies 04/09/2018    - using Immodium PRN.     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 Collene Mares 2009    - no polyps on colonoscopy 04/09/2018  Cholecystectomy for cholelithiasis 16 years ago  SIBO +/- d-IBS:  Symptoms dramatically improved with empiric trial  of rifaxamin/doxycycline but return within 2 weeks of completing treatment. Continue colestipol. Trial of dicylomine recommended. Continue to avoid food triggers. Nutrition consult recommended given her challenges identifying food options.   Moderate reflux on esophagram: Initially diagnosed in 2008 as part of asthma evaluation. Failed trial off of famotidine. Reviewed diagnosis. Resume famotidine.    PLAN: Continue to avoid dairy and other food triggers Continue colestipol Continue famotidine Trial of dicyclomine 20 mg QID PRN to minimize post-prandial symptoms (one month supply with 3 refills) Nutrition consultation for chronic diarrhea, reflux and SIBO Follow-up in 3-6 months, or earlier as needed  I spent 35 minutes, including in depth chart review, face-to-face time with the patient, coordinating care, and ordering studies and medications as appropriate, and documentation.  HPI: Violetta Roena Sassaman is a 64 y.o. female who is seen in a follow-up virtual visit. She was seen in consultation 03/10/18 and in follow-up 04/23/18, 01/22/19, and 03/11/19. She had an EGD and colonoscopy 04/09/18.  She returns in scheduled virtual follow-up after a breath test for bacterial overgrowth.  GI symptoms largely improved on colestipol. She did not tolerate 2 BID due to severe constipation. She is taking 2 cholestipol in the morning and frequently 1 pill in the evening. She will titrate the dose to avoid further constipation.  Continues to have up to 5-7 formed BM daily. At it's worst, she was having 10-15 BM daily. Occasional mucous. No blood. Abdominal pain continues to be diffuse, can be severe, feels like stabbing or punching. Improved with defecation.  Associated, near constant bloating and early satiety. Bothered by a large amount of ongoing gas.  Two weeks of Xifaxan provided significant relief in her symptoms. She had no significant bloating or diarrhea during this time.  However all symptoms returned  within 2 weeks of completing treatment.   Lactulose testing for small intestinal bacterial overgrowth 04/14/2019 showed suspected bacterial overgrowth.  Increase in hydrogen was 12 ppm, increase in methane 3 ppm, increase in combined hydrogen and methane 15 ppm which is the cutoff for diagnosis.  Her GI symptoms worsened while hospitalized for Covid in November 2020 as she did not receive her colestipol.  Resuming treatment following discharge improved some of her symptoms.  However she continues to have 5-7 formed bowel movements daily and significant bloating.  Symptoms are similar to those that she experienced prior to empiric treatment with Xifaxan. She has gained 20 pounds and reports no energy since Covid.   Called in May with recurrent symptoms with explosive diarrhea and lots of gas. Requested another trial of doxycycline 100 mg BID x 14 days.  Initially felt better, but, symptoms recurred within 2 weeks.   Has identified dairy products and sugar as triggers. Switching to plant-based dairy products has provided some relief.  Has noticed some worsening symptoms on sugar substitutes.   Medicare will not pay for nutrition consultation related to obesity, but, that isn't the reason she requested the referral.  Notes that she tried to stop her famotidine a couple of weeks ago but developed recurrent reflux. Symptoms resolved after resuming famotidine.   Prior endoscopy: Upper endoscopy and colonoscopy 04/09/2018  revealed pancolonic diverticulosis and was otherwise normal. Gastric biopsy showed chronic inactive gastritis with no evidence of H. pylori.  Duodenum biopsies and random colon biopsies were normal  Past Medical History:  Diagnosis Date  . Abnormal EKG   . Anxiety   . Asthma   . Bipolar depression ([place]), Anxiety, PTSD, Panic disorder    -managed by [facility]  . Depression   . Foot pain   . GERD (gastroesophageal reflux disease)   . Heart murmur    as a child  .  Insomnia   . Leg swelling   . Osteoporosis   . Tachycardia     Past Surgical History:  Procedure Laterality Date  . CARPAL TUNNEL RELEASE Left   . CESAREAN SECTION     x 2  . CHOLECYSTECTOMY    . ENDOMETRIAL ABLATION    . KNEE SURGERY Right    reconstruction for patellar dislocation  . LAPAROSCOPY     x 2  . TUBAL LIGATION    . ULNAR NERVE REPAIR Left     Current Outpatient Medications  Medication Sig Dispense Refill  . albuterol (PROVENTIL) (2.5 MG/3ML) 0.083% nebulizer solution Take 3 mLs (2.5 mg total) by nebulization every 6 (six) hours as needed for wheezing or shortness of breath. 75 mL 12  . Albuterol Sulfate (PROAIR RESPICLICK) 735 (90 Base) MCG/ACT AEPB Inhale 2 puffs into the lungs every 6 (six) hours as needed. (Patient taking differently: Inhale 2 puffs into the lungs every 6 (six) hours as needed (For shortness of breath). ) 1 each 1  . budesonide-formoterol (SYMBICORT) 160-4.5 MCG/ACT inhaler Inhale 2 puffs into the lungs 2 (two) times daily. 1 Inhaler 11  . busPIRone (BUSPAR) 15 MG tablet Take 1 tablet (15 mg total) by mouth 3 (three) times daily. 270 tablet 0  . cetirizine (ZYRTEC) 10 MG tablet Take 1 tablet (10 mg total) by mouth daily. 90 tablet 1  . Cholecalciferol (VITAMIN D-3 PO) Take 5,000 Units by mouth  daily.     . colestipol (COLESTID) 1 g tablet Take 1 tablet (1 g total) by mouth 2 (two) times daily. TAKE 2 TABLETS BY MOUTH TWICE DAILY 180 tablet 2  . diazepam (VALIUM) 5 MG tablet Take 1 tablet (5 mg total) by mouth every 6 (six) hours as needed for anxiety. 120 tablet 3  . doxazosin (CARDURA) 4 MG tablet Take 2 tablets (8 mg total) by mouth at bedtime. 180 tablet 0  . famotidine (PEPCID) 20 MG tablet Take 1 tablet (20 mg total) by mouth 2 (two) times daily. 180 tablet 1  . fluticasone (FLONASE) 50 MCG/ACT nasal spray Place 2 sprays into both nostrils 2 (two) times daily. 96 g 0  . mirtazapine (REMERON) 15 MG tablet Take 1 tablet (15 mg total) by mouth at  bedtime. 90 tablet 0  . montelukast (SINGULAIR) 10 MG tablet Take 1 tablet (10 mg total) by mouth at bedtime. 90 tablet 1   No current facility-administered medications for this visit.    Allergies as of 11/24/2019 - Review Complete 11/24/2019  Allergen Reaction Noted  . Aspirin Other (See Comments)   . Codeine Itching 09/26/2017  . Sulfamethoxazole-trimethoprim Other (See Comments)   . Trileptal [oxcarbazepine]  11/28/2018  . Trimethoprim Other (See Comments) 11/12/2014  . Ultram [tramadol] Itching 09/26/2017  . Vioxx [rofecoxib]      Family History  Problem Relation Age of Onset  . Heart failure Mother   . Hypertension Mother   . Hyperlipidemia Mother   . Congestive Heart Failure Mother   . Other Father        killed  . COPD Paternal Aunt        alot of aunts and uncle COPD or Emphysema  . Emphysema Paternal Uncle   . Colon polyps Maternal Aunt   . COPD Brother   . Heart disease Brother   . Pulmonary embolism Brother   . Breast cancer Neg Hx   . Colon cancer Neg Hx   . Esophageal cancer Neg Hx   . Pancreatic cancer Neg Hx   . Stomach cancer Neg Hx   . Liver disease Neg Hx     Social History   Socioeconomic History  . Marital status: Single    Spouse name: Not on file  . Number of children: 3  . Years of education: Not on file  . Highest education level: Not on file  Occupational History  . Occupation: disabled  Tobacco Use  . Smoking status: Former Smoker    Packs/day: 20.00    Years: 1.00    Pack years: 20.00    Quit date: 06/19/1999    Years since quitting: 20.4  . Smokeless tobacco: Never Used  . Tobacco comment: 20 year estimate but probably less   Substance and Sexual Activity  . Alcohol use: Yes    Alcohol/week: 0.0 standard drinks    Comment: Occass.   . Drug use: No  . Sexual activity: Never  Other Topics Concern  . Not on file  Social History Narrative   Work or School: Disabled seconndary to psychiatric conditions      Home Situation:  lives alone with 2 cats and one dog      Spiritual Beliefs: Christian      Lifestyle: no regular exercise, diet  Not great - wants to embark on healthier lifestyle   Social Determinants of Health   Financial Resource Strain:   . Difficulty of Paying Living Expenses:   Food Insecurity:   .  Worried About Charity fundraiser in the Last Year:   . Arboriculturist in the Last Year:   Transportation Needs:   . Film/video editor (Medical):   Marland Kitchen Lack of Transportation (Non-Medical):   Physical Activity:   . Days of Exercise per Week:   . Minutes of Exercise per Session:   Stress:   . Feeling of Stress :   Social Connections:   . Frequency of Communication with Friends and Family:   . Frequency of Social Gatherings with Friends and Family:   . Attends Religious Services:   . Active Member of Clubs or Organizations:   . Attends Archivist Meetings:   Marland Kitchen Marital Status:   Intimate Partner Violence:   . Fear of Current or Ex-Partner:   . Emotionally Abused:   Marland Kitchen Physically Abused:   . Sexually Abused:     Physical Exam: Complete physical exam not performed due to the limits inherent in a telehealth encounter.  General: Awake, alert, and oriented, and well communicative. In no acute distress.  HEENT: EOMI, non-icteric sclera, NCAT, MMM  Neck: Normal movement of head and neck  Pulm: No labored breathing, speaking in full sentences without conversational dyspnea  Derm: No apparent lesions or bruising in visible field  MS: Moves all visible extremities without noticeable abnormality  Psych: Pleasant, cooperative, normal speech, normal affect and normal insight Neuro: Alert and appropriate   Reni Hausner L. Tarri Glenn, MD, MPH [facility] 11/24/2019, 10:22 AM

## 2019-11-24 NOTE — Patient Instructions (Addendum)
If you are age 64 or older, your body mass index should be between 23-30. Your There is no height or weight on file to calculate BMI. If this is out of the aforementioned range listed, please consider follow up with your Primary Care Provider.  If you are age 26 or younger, your body mass index should be between 19-25. Your There is no height or weight on file to calculate BMI. If this is out of the aformentioned range listed, please consider follow up with your Primary Care Provider.   Continue with colestipol and famotadine.  We have sent a trial prescription to your pharmacy for dicyclomine 20mg  take four times a day as need for abdominal cramping.  A referral has been sent for you to see nutrition for chronic diarrhea, reflux and SIBO.  Follow up in 3-6 months or earlier if needed.  Due to recent changes in healthcare laws, you may see the results of your imaging and laboratory studies on MyChart before your provider has had a chance to review them.  We understand that in some cases there may be results that are confusing or concerning to you. Not all laboratory results come back in the same time frame and the provider may be waiting for multiple results in order to interpret others.  Please give [place] 48 hours in order for your provider to thoroughly review all the results before contacting the office for clarification of your results.   Thank you for trusting me with your gastrointestinal care!    Thornton Park, MD, MPH

## 2019-12-01 DIAGNOSIS — M542 Cervicalgia: Secondary | ICD-10-CM | POA: Diagnosis not present

## 2019-12-10 DIAGNOSIS — M542 Cervicalgia: Secondary | ICD-10-CM | POA: Diagnosis not present

## 2019-12-11 ENCOUNTER — Other Ambulatory Visit: Payer: Self-pay | Admitting: Psychiatry

## 2019-12-31 ENCOUNTER — Encounter: Payer: Self-pay | Admitting: Family Medicine

## 2019-12-31 NOTE — Progress Notes (Signed)
CARDIOLOGY CONSULT NOTE       Patient ID: Brittany [name] MRN: [id number] DOB/AGE: 02/27/1956 64 y.o.  Admit date: (Not on file) Referring Physician: Ethlyn Gallery Primary Physician: Caren Macadam, MD Primary Cardiologist: New Reason for Consultation: Dyspnea  Active Problems:   * No active hospital problems. *   HPI:  64 y.o. referred by dr Ethlyn Gallery for dyspnea. She has significant history of Asthma, GERD from reflux with extensive GI evaluation, PtSD with panic disorder and agoraphobia actively being seen by psychiatry. Last seen by me 5 years ago for abnormal ECG which consisted of RBBB/LAD/LAFB I use to care for her mom Pamala Hurry who died of CAD. 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 She doe not work due to PTSD. She has been emotional lately due to loss of 2/3 of her emotional support pets one dog and one cat She still has one cat  ROS All other systems reviewed and negative except as noted above  Past Medical History:  Diagnosis Date  . Abnormal EKG   . Anxiety   . Asthma   . Bipolar depression ([place]), Anxiety, PTSD, Panic disorder    -managed by [facility]  . Depression   . Foot pain   . GERD (gastroesophageal reflux disease)   . Heart murmur    as a child  . Insomnia   . Leg swelling   . Osteoporosis   . Tachycardia     Family History  Problem Relation Age of Onset  . Heart failure Mother   . Hypertension Mother   . Hyperlipidemia Mother   . Congestive Heart Failure Mother   . Other Father        killed  . COPD Paternal 19        a lot of aunts and uncle COPD or Emphysema  . Emphysema Paternal Uncle   . Colon polyps Maternal Aunt   . COPD Brother   . Heart disease Brother   . Pulmonary embolism Brother   . Breast cancer Neg Hx   . Colon cancer Neg Hx   .  Esophageal cancer Neg Hx   . Pancreatic cancer Neg Hx   . Stomach cancer Neg Hx   . Liver disease Neg Hx     Social History   Socioeconomic History  . Marital status: Single    Spouse name: Not on file  . Number of children: 3  . Years of education: Not on file  . Highest education level: Not on file  Occupational History  . Occupation: disabled  Tobacco Use  . Smoking status: Former Smoker    Packs/day: 20.00    Years: 1.00    Pack years: 20.00    Quit date: 06/19/1999    Years since quitting: 20.5  . Smokeless tobacco: Never Used  . Tobacco comment: 20 year estimate but probably less   Vaping Use  . Vaping Use: Never used  Substance and Sexual Activity  . Alcohol use: Yes    Alcohol/week: 0.0 standard drinks    Comment: Occass.   . Drug use: No  . Sexual activity: Never  Other Topics Concern  . Not on file  Social History Narrative   Work or School: Disabled seconndary to psychiatric conditions      Home Situation: lives alone with 2  cats and one dog      Spiritual Beliefs: Christian      Lifestyle: no regular exercise, diet  Not great - wants to embark on healthier lifestyle   Social Determinants of Health   Financial Resource Strain:   . Difficulty of Paying Living Expenses:   Food Insecurity:   . Worried About Charity fundraiser in the Last Year:   . Arboriculturist in the Last Year:   Transportation Needs:   . Film/video editor (Medical):   Marland Kitchen Lack of Transportation (Non-Medical):   Physical Activity:   . Days of Exercise per Week:   . Minutes of Exercise per Session:   Stress:   . Feeling of Stress :   Social Connections:   . Frequency of Communication with Friends and Family:   . Frequency of Social Gatherings with Friends and Family:   . Attends Religious Services:   . Active Member of Clubs or Organizations:   . Attends Archivist Meetings:   Marland Kitchen Marital Status:   Intimate Partner Violence:   . Fear of Current or Ex-Partner:   .  Emotionally Abused:   Marland Kitchen Physically Abused:   . Sexually Abused:     Past Surgical History:  Procedure Laterality Date  . CARPAL TUNNEL RELEASE Left   . CESAREAN SECTION     x 2  . CHOLECYSTECTOMY    . ENDOMETRIAL ABLATION    . KNEE SURGERY Right    reconstruction for patellar dislocation  . LAPAROSCOPY     x 2  . TUBAL LIGATION    . ULNAR NERVE REPAIR Left       Current Outpatient Medications:  .  albuterol (PROVENTIL) (2.5 MG/3ML) 0.083% nebulizer solution, Take 3 mLs (2.5 mg total) by nebulization every 6 (six) hours as needed for wheezing or shortness of breath., Disp: 75 mL, Rfl: 12 .  Albuterol Sulfate (PROAIR RESPICLICK) 948 (90 Base) MCG/ACT AEPB, Inhale 2 puffs into the lungs every 6 (six) hours as needed., Disp: 1 each, Rfl: 1 .  budesonide-formoterol (SYMBICORT) 160-4.5 MCG/ACT inhaler, Inhale 2 puffs into the lungs 2 (two) times daily., Disp: 1 Inhaler, Rfl: 11 .  busPIRone (BUSPAR) 15 MG tablet, Take 1 tablet (15 mg total) by mouth 3 (three) times daily., Disp: 270 tablet, Rfl: 0 .  cetirizine (ZYRTEC) 10 MG tablet, Take 1 tablet (10 mg total) by mouth daily., Disp: 90 tablet, Rfl: 1 .  Cholecalciferol (VITAMIN D-3 PO), Take 5,000 Units by mouth daily. , Disp: , Rfl:  .  colestipol (COLESTID) 1 g tablet, Take 1 tablet (1 g total) by mouth 2 (two) times daily. TAKE 2 TABLETS BY MOUTH TWICE DAILY, Disp: 180 tablet, Rfl: 2 .  diazepam (VALIUM) 5 MG tablet, Take 1 tablet (5 mg total) by mouth every 6 (six) hours as needed for anxiety., Disp: 120 tablet, Rfl: 3 .  dicyclomine (BENTYL) 20 MG tablet, Take 1 tablet (20 mg total) by mouth 4 (four) times daily as needed for spasms., Disp: 120 tablet, Rfl: 3 .  doxazosin (CARDURA) 4 MG tablet, Take 4 mg by mouth daily., Disp: , Rfl:  .  famotidine (PEPCID) 20 MG tablet, Take 1 tablet (20 mg total) by mouth 2 (two) times daily., Disp: 180 tablet, Rfl: 1 .  fluticasone (FLONASE) 50 MCG/ACT nasal spray, Place 2 sprays into both nostrils  2 (two) times daily., Disp: 96 g, Rfl: 0 .  mirtazapine (REMERON) 15 MG tablet, TAKE ONE TABLET BY  MOUTH EVERY NIGHT AT BEDTIME, Disp: 90 tablet, Rfl: 0 .  montelukast (SINGULAIR) 10 MG tablet, Take 1 tablet (10 mg total) by mouth at bedtime., Disp: 90 tablet, Rfl: 1    Physical Exam: Blood pressure 104/72, pulse 82, height 5' 1.5" (1.562 m), weight 192 lb 2.2 oz (87.2 kg), SpO2 96 %.   Anxious  Chronically ill overweight white female  HEENT: normal Neck supple with no adenopathy JVP normal no bruits no thyromegaly Lungs clear with no wheezing and good diaphragmatic motion Heart:  S1/S2 no murmur, no rub, gallop or click PMI normal Abdomen: benighn, BS positve, no tenderness, no AAA no bruit.  No HSM or HJR Distal pulses intact with no bruits No edema Neuro non-focal Skin warm and dry No muscular weakness   Labs:   Lab Results  Component Value Date   WBC 4.0 05/12/2019   HGB 13.3 05/12/2019   HCT 40.2 05/12/2019   MCV 91.9 05/12/2019   PLT 231.0 05/12/2019   No results for input(s): NA, K, CL, CO2, BUN, CREATININE, CALCIUM, PROT, BILITOT, ALKPHOS, ALT, AST, GLUCOSE in the last 168 hours.  Invalid input(s): LABALBU No results found for: CKTOTAL, CKMB, CKMBINDEX, TROPONINI  Lab Results  Component Value Date   CHOL 250 (H) 11/28/2018   CHOL 206 (H) 06/06/2017   Lab Results  Component Value Date   HDL 62.60 11/28/2018   HDL 64.60 06/06/2017   Lab Results  Component Value Date   LDLCALC 166 (H) 11/28/2018   LDLCALC 125 (H) 06/06/2017   Lab Results  Component Value Date   TRIG 40 04/22/2019   TRIG 109.0 11/28/2018   TRIG 83.0 06/06/2017   Lab Results  Component Value Date   CHOLHDL 4 11/28/2018   CHOLHDL 3 06/06/2017   No results found for: LDLDIRECT    Radiology: No results found.  EKG: 05/25/19  SR rate 75 RBBB/LAD chronic    ASSESSMENT AND PLAN:   1. Abnormal ECG:  Chronic no acute changes or high grade AV block RBBB LAD  2. PTSD/Bipolar:  F/u  psychiatry currently on remeron, valium and buspar  3. Asthma: continue proventil, proair and symbicort ? Related to chronic reflux   5. GI:  Chronic IBS/GERD/Reflux on bentyl and pepcid f/u GI  6. Dyspnea:  Functional and related to #3 history of COVID f/u echo for RV/LV function   F/U with cardiology PRN   Signed: Jenkins Rouge 01/08/2020, 9:24 AM

## 2020-01-08 ENCOUNTER — Ambulatory Visit: Payer: PPO | Admitting: Cardiovascular Disease

## 2020-01-08 ENCOUNTER — Other Ambulatory Visit: Payer: Self-pay

## 2020-01-08 ENCOUNTER — Encounter: Payer: Self-pay | Admitting: Cardiovascular Disease

## 2020-01-08 VITALS — BP 104/72 | HR 82 | Ht 61.5 in | Wt 192.1 lb

## 2020-01-08 DIAGNOSIS — R0600 Dyspnea, unspecified: Secondary | ICD-10-CM | POA: Diagnosis not present

## 2020-01-08 NOTE — Patient Instructions (Addendum)
Medication Instructions:  *If you need a refill on your cardiac medications before your next appointment, please call your pharmacy*  Lab Work: If you have labs (blood work) drawn today and your tests are completely normal, you will receive your results only by: . MyChart Message (if you have MyChart) OR . A paper copy in the mail If you have any lab test that is abnormal or we need to change your treatment, we will call you to review the results.  Testing/Procedures: Your physician has requested that you have an echocardiogram. Echocardiography is a painless test that uses sound waves to create images of your heart. It provides your doctor with information about the size and shape of your heart and how well your heart's chambers and valves are working. This procedure takes approximately one hour. There are no restrictions for this procedure.  Follow-Up: At [facility], you and your health needs are our priority.  As part of our continuing mission to provide you with exceptional heart care, we have created designated Provider Care Teams.  These Care Teams include your primary Cardiologist (physician) and Advanced Practice Providers (APPs -  Physician Assistants and Nurse Practitioners) who all work together to provide you with the care you need, when you need it.  We recommend signing up for the patient portal called "MyChart".  Sign up information is provided on this After Visit Summary.  MyChart is used to connect with patients for Virtual Visits (Telemedicine).  Patients are able to view lab/test results, encounter notes, upcoming appointments, etc.  Non-urgent messages can be sent to your provider as well.   To learn more about what you can do with MyChart, go to [email or web address].    Your next appointment:   As needed  The format for your next appointment:   In Person  Provider:   You may see Dr. Nishan or one of the following Advanced Practice Providers on your designated Care  Team:    Lori Gerhardt, NP  Laura Ingold, NP  Jill McDaniel, NP    

## 2020-01-14 ENCOUNTER — Other Ambulatory Visit: Payer: Self-pay | Admitting: Critical Care Medicine

## 2020-01-14 ENCOUNTER — Other Ambulatory Visit: Payer: Self-pay | Admitting: Psychiatry

## 2020-01-22 ENCOUNTER — Encounter: Payer: Self-pay | Admitting: Dietician

## 2020-01-22 ENCOUNTER — Other Ambulatory Visit: Payer: Self-pay

## 2020-01-22 ENCOUNTER — Encounter: Payer: PPO | Attending: Gastroenterology | Admitting: Dietician

## 2020-01-22 DIAGNOSIS — K9089 Other intestinal malabsorption: Secondary | ICD-10-CM | POA: Diagnosis not present

## 2020-01-22 DIAGNOSIS — K219 Gastro-esophageal reflux disease without esophagitis: Secondary | ICD-10-CM | POA: Diagnosis not present

## 2020-01-22 DIAGNOSIS — R197 Diarrhea, unspecified: Secondary | ICD-10-CM | POA: Insufficient documentation

## 2020-01-22 DIAGNOSIS — Z713 Dietary counseling and surveillance: Secondary | ICD-10-CM | POA: Diagnosis not present

## 2020-01-22 DIAGNOSIS — E739 Lactose intolerance, unspecified: Secondary | ICD-10-CM | POA: Diagnosis not present

## 2020-01-22 DIAGNOSIS — R140 Abdominal distension (gaseous): Secondary | ICD-10-CM | POA: Diagnosis not present

## 2020-01-22 NOTE — Progress Notes (Signed)
Medical Nutrition Therapy:  Appt start time: 0910 end time:  1020.   Assessment:  Primary concerns today:  Patient is here today alone.  She would like to learn more what to eat to better tolerate food. She avoids sugar and dairy as these cause diarrhea.   High Fiber  bcauses diarrhea. Tolerates small to moderate amounts of vegetables.  Tolerates small amounts of fruit. Tolerates monk fruit and stevia. She does not tolerate sugar alcohol or sucralose which cause diarrhea. Excessive caffeine bother's her stomach. She states that problems with diarrhea started 1 1/2 years after her gallbladder was removed but related this to getting older. She complains of excessive fatigue as well as inability to sleep without Remeron. She would like to lose weight but this is not her ultimate purpose. She states that she eats the same thing much of the time.  She states that if she looks at the big picture, this bothers her but if she looks at it day to day she copes with this.  Intake is limited due to intolerances.   History includes explosive diarrhea, abdominal pain, bloating, SIBO, GERD, asthma, covid 04/2019 She sees Dr. Tarri Glenn at [facility]. She is on medication for the diarrhea but states that this works less than it did in the past.  She states that her appetite varies.  She forgets to eat at times and when she does eat she is hungry even if she has just eaten. She states that her appetite has been out of control since COVID 04/2019.  She lost 10 lbs then but regained 30 lbs. She was trying to stay away from white carbs such as bread, pasta, potatoes do not hurt her stomach. She lost 40 lbs on keto 2 years ago but regained.  190 lbs 01/22/2020 115 lbs lowest weight early 20's 211 lbs highest weight in 2011 when she had increased amounts of stress  Patient lives alone.  She was taken out of work in 2010 due to PTSD/anxiety.  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  Patient's daughter has a BS in nutrition. Money is extra tight and goes to a [employer] once a week for a food box.  This is due to increased hospital bills post covid.  She reports that much of this food is processed and she often gives much of this away.  Preferred Learning Style:   No preference indicated   Learning Readiness:   Ready  Change in progress   MEDICATIONS: see list to include MVI, biotin, vitamin D, Remeron (which she takes for sleep)  - this is an appetite stimulant   DIETARY INTAKE: Never a breakfast person and will often eat a late breakfast and early dinner. 24-hr recall:  B ( AM): toasted peanut butter sandwich on 2 slices keto friendly Nani Gasser bread OR eggs with plant based cheese with or without cheese Snk ( AM):   L ( PM): skips 4 days per week, plant based cheese sandwich, lunch meat sandwich OR chicken breast, frozen broccoli, cauliflower or spinach Snk ( PM):  D ( PM): similar to lunch Snk ( PM):  Beverages: coffee (half caffeine), water, water with mio  Usual physical activity:   Progress Towards Goal(s):  In progress.   Nutritional Diagnosis:  NB-1.1 Food and nutrition-related knowledge deficit As related to nutrition, intolerances, balance.  As evidenced by diet hx and patient report.    Intervention:  Nutrition education related  to her gastrointestinal symptoms.  The bread that she eats contains inulin and recommended that she avoid this ingredient as well as sugar, sucralose, sugar alcohol and any dairy ingredients.  Discussed that emulsifiers can also cause GI imbalance and symptoms.  Discussed that she uses a lot of Mio which contains sucralose.and provided alternatives. Discussed option of the [place] and recommended that she try above suggestions first to see if there are any changes to her symptoms.  Discussed that the [place] is to be a short term diet only to help  determine best tolerated foods.  We will discuss more at her next visit.  Suggested that if she does start the [place], she journal her symptoms and intake.  Plan: Avoid products that contain Inulin or Sucralose or sugar alcohols (end in -ol) and anything with glucose or sucrose (sugar) Avoid products with any dairy in the ingredient list. Be mindful to avoid processed foods most often particularly due to emulsifiers. Consider True Lemon rather than Mio. Consider Low Fod Map in the future- we can discuss this further a another appointment.   [employer] app for the low fodmap diet  Teaching Method Utilized:  Ship broker  Handouts given during visit include:  Low Fod Map diet from AND  Barriers to learning/adherence to lifestyle change: food limits/intolerance  Demonstrated degree of understanding via:  Teach Back   Monitoring/Evaluation:  Dietary intake, exercise, and body weight in 1 month(s).

## 2020-01-22 NOTE — Patient Instructions (Addendum)
Avoid products that contain Inulin or Sucralose or sugar alcohols (end in -ol) and anything with glucose or sucrose (sugar) Avoid products with any dairy in the ingredient list. Be mindful to avoid processed foods most often particularly due to emulsifiers. Consider True Lemon rather than Mio.  Consider Low Fod Map in the future- we can discuss this further a another appointment. [employer] app for the low fodmap diet

## 2020-01-26 ENCOUNTER — Ambulatory Visit (HOSPITAL_COMMUNITY): Payer: PPO | Attending: Cardiology

## 2020-01-26 ENCOUNTER — Other Ambulatory Visit: Payer: Self-pay

## 2020-01-26 DIAGNOSIS — R0600 Dyspnea, unspecified: Secondary | ICD-10-CM | POA: Diagnosis not present

## 2020-01-26 LAB — ECHOCARDIOGRAM COMPLETE
Area-P 1/2: 3.65 cm2
S' Lateral: 2.5 cm

## 2020-01-26 MED ORDER — PERFLUTREN LIPID MICROSPHERE
1.0000 mL | INTRAVENOUS | Status: AC | PRN
  Administered 2020-01-26: 2 mL via INTRAVENOUS

## 2020-02-08 ENCOUNTER — Other Ambulatory Visit: Payer: Self-pay | Admitting: Family Medicine

## 2020-02-08 ENCOUNTER — Telehealth: Payer: Self-pay | Admitting: Critical Care Medicine

## 2020-02-08 DIAGNOSIS — Z1231 Encounter for screening mammogram for malignant neoplasm of breast: Secondary | ICD-10-CM

## 2020-02-08 NOTE — Telephone Encounter (Signed)
Patient called back and rescheduled original appointment for 8/24 at 2pm.  She is now scheduled for 8/26 at 11:30.  Nothing further needed.

## 2020-02-08 NOTE — Telephone Encounter (Signed)
She can certainly get an x-ray when she comes in for her visit.  My concern is her symptoms could potentially be related to dehydration.  The way in reading her note she has only had the symptoms when she is exercising outside, correct? Not when exercising inside?  It seems like she should focus on staying hydrated when she is outside or continuing to exercise inside.  Current asthma medications.  If she is still having shortness of breath at rest or when inside this could be an asthma flare.  Please let us know if she is having symptoms when she is inside.  [name], DO 02/08/20 11:07 AM [facility]

## 2020-02-08 NOTE — Telephone Encounter (Signed)
Primary Pulmonologist: Carlis Abbott Last office visit and with whom: 09/03/19, Carlis Abbott What do we see them for (pulmonary problems): Moderate Persistent asthma with acute exacerbation and allergic rhinitis Last OV assessment/plan:    Assessment & Plan:     ICD-10-CM   1. Moderate persistent asthma with acute exacerbation  J45.41   2. Allergic rhinitis, unspecified seasonality, unspecified trigger  J30.9      Moderate persistent asthma, currently well controlled. -Continue Symbicort twice daily.  Continue rinse her mouth after every use. -Continue albuterol as needed -Continue Singulair -Up-to-date on seasonal flu vaccine.  Recommended Covid vaccination when it is available to her.  She is planning on receiving it in the next few weeks. -Reviewed PFTs today.  History of COVID-19 viral pneumonia causing acute hypoxic respiratory failure-resolved -Walked in the office today to monitor for desaturation (lowest 94%) -Okay to stop supplemental oxygen permanently  Allergic rhinosinusitis -Continue Zyrtec and Flonase   RTC in 6 months.    Current Outpatient Medications:  .  albuterol (PROVENTIL) (2.5 MG/3ML) 0.083% nebulizer solution, Take 3 mLs (2.5 mg total) by nebulization every 6 (six) hours as needed for wheezing or shortness of breath., Disp: 75 mL, Rfl: 12 .  Albuterol Sulfate (PROAIR RESPICLICK) 505 (90 Base) MCG/ACT AEPB, Inhale 2 puffs into the lungs every 6 (six) hours as needed. (Patient taking differently: Inhale 2 puffs into the lungs every 6 (six) hours as needed (For shortness of breath). ), Disp: 1 each, Rfl: 1 .  Azelastine & Fluticasone 137 & 50 MCG/ACT THPK, Place 2 sprays into the nose 2 (two) times a week., Disp: 1 each, Rfl: 11 .  budesonide-formoterol (SYMBICORT) 160-4.5 MCG/ACT inhaler, Inhale 2 puffs into the lungs 2 (two) times daily., Disp: 1 Inhaler, Rfl: 11 .  busPIRone (BUSPAR) 15 MG tablet, Take 1 tablet (15 mg total) by mouth 3 (three) times daily.,  Disp: 270 tablet, Rfl: 0 .  cetirizine (ZYRTEC) 10 MG tablet, Take 1 tablet (10 mg total) by mouth daily., Disp: 90 tablet, Rfl: 1 .  Cholecalciferol (VITAMIN D-3 PO), Take 5,000 Units by mouth daily. , Disp: , Rfl:  .  colestipol (COLESTID) 1 g tablet, TAKE 2 TABLETS BY MOUTH TWICE DAILY, Disp: 120 tablet, Rfl: 0 .  diazepam (VALIUM) 5 MG tablet, Take 1 tablet (5 mg total) by mouth every 8 (eight) hours as needed for anxiety., Disp: 90 tablet, Rfl: 2 .  doxazosin (CARDURA) 4 MG tablet, Take 1.5 tablets (6 mg total) by mouth at bedtime. (Patient taking differently: Take 8 mg by mouth at bedtime. ), Disp: 135 tablet, Rfl: 0 .  famotidine (PEPCID) 20 MG tablet, Take 1 tablet (20 mg total) by mouth 2 (two) times daily., Disp: 180 tablet, Rfl: 1 .  fluticasone (FLONASE) 50 MCG/ACT nasal spray, Place 2 sprays into both nostrils 2 (two) times daily., Disp: 16 g, Rfl: 5 .  mirtazapine (REMERON) 15 MG tablet, Take 1 tablet (15 mg total) by mouth at bedtime., Disp: 30 tablet, Rfl: 1 .  montelukast (SINGULAIR) 10 MG tablet, Take 1 tablet (10 mg total) by mouth at bedtime., Disp: 90 tablet, Rfl: 1   Brittany Hy, DO [facility] 09/03/2019 10:16 AM     Patient Instructions by Brittany Hy, DO at 09/03/2019 10:00 AM Author: [name], DO Author Type: Physician Filed: 09/03/2019 10:22 AM  Note Status: Addendum Cosign: Cosign Not Required Encounter Date: 09/03/2019  Editor: Brittany Hy, DO (Physician)      Prior Versions:  1. Brittany Hy, DO (Physician) at 09/03/2019 10:21 AM - Addendum   2. Brittany Hy, DO (Physician) at 09/03/2019 10:18 AM - Addendum   3. Brittany Hy, DO (Physician) at 09/03/2019 10:17 AM - Signed      Thank you for visiting Dr. Carlis Abbott at [facility]. We recommend the following:   Keep taking cetirizine and montelukast every day.  Keep taking flonase twice daily. Keep taking your inhaler two times daily every day.      Meds ordered this  encounter  Medications  . fluticasone (VERAMYST) 27.5 MCG/SPRAY nasal spray    Sig: Place 2 sprays into the nose in the morning and at bedtime.    Dispense:  9.1 mL    Refill:  11    Return in about 6 months (around 03/05/2020).    Please do your part to reduce the spread of COVID-19.     Instructions    Return in about 6 months (around 03/05/2020). Thank you for visiting Dr. Carlis Abbott at [facility]. We recommend the following:   Keep taking cetirizine and montelukast every day.  Keep taking flonase twice daily. Keep taking your inhaler two times daily every day.      Meds ordered this encounter  Medications  . fluticasone (VERAMYST) 27.5 MCG/SPRAY nasal spray    Sig: Place 2 sprays into the nose in the morning and at bedtime.    Dispense:  9.1 mL    Refill:  11    Return in about 6 months (around 03/05/2020).        Was appointment offered to patient (explain)?  Yes, scheduled appointment to see [name] on 8/24.   Reason for call: sob with dizziness with exercise and after being out in the heat.  States she has been doing fine with working out, doing walking on the treadmill and then added calisthenics.  The first time she did not take a break between and felt sob and dizziness with prickly numbness and feeling like she would faint if she stood up.  This has happened 3 times since last week.  2 related to exercise and one episode after walking a short distance and being out in the heat for 2 hours.  She is requesting a CXR prior to her September appointment and is wondering if she needs to be covid tested.  (examples of things to ask: : When did symptoms start? Last week Fever? No Cough?yes Productive? Sometimes Color to sputum? no More sputum than usual? increase phlem in throat per patient Wheezing? No Have you needed increased oxygen? no Are you taking your respiratory medications?yes What over the counter measures have you tried?  none  Allergies  Allergen Reactions  . Aspirin Other (See Comments)    discomfort  . Codeine Itching  . Sulfamethoxazole-Trimethoprim Other (See Comments)    Unknown reaction per pt  . Trileptal [Oxcarbazepine]     itching  . Trimethoprim Other (See Comments)    Unknown, per pt  . Ultram [Tramadol] Itching  . Vioxx [Rofecoxib]     Unknown per pt    Immunization History  Administered Date(s) Administered  . Influenza Split 04/26/2011  . Influenza Whole 03/18/2009, 03/18/2010, 03/18/2016  . Influenza,inj,Quad PF,6+ Mos 04/19/2014, 02/28/2017, 03/06/2018, 02/13/2019  . PFIZER SARS-COV-2 Vaccination 09/04/2019, 09/29/2019  . Pneumococcal Conjugate-13 03/10/2015  . Pneumococcal Polysaccharide-23 06/18/2005  . Tdap 12/04/2016  . Zoster Recombinat (Shingrix) 01/01/2018, 06/22/2018

## 2020-02-08 NOTE — Telephone Encounter (Signed)
Dr. Carlis Abbott agrees with an appointment with Brittany Edison NP tomorrow afternoon.  Advised we hold off on any treatment until see by TP.  Called and left a VM for patient letting her know to keep her appointment scheduled for 02/11/20 at 11:30 am and to arrive by 11:15 am for check in.

## 2020-02-09 ENCOUNTER — Ambulatory Visit: Payer: PPO | Admitting: Adult Health

## 2020-02-09 ENCOUNTER — Encounter: Payer: Self-pay | Admitting: Psychiatry

## 2020-02-09 ENCOUNTER — Telehealth (INDEPENDENT_AMBULATORY_CARE_PROVIDER_SITE_OTHER): Payer: PPO | Admitting: Psychiatry

## 2020-02-09 DIAGNOSIS — F4310 Post-traumatic stress disorder, unspecified: Secondary | ICD-10-CM

## 2020-02-09 DIAGNOSIS — F515 Nightmare disorder: Secondary | ICD-10-CM | POA: Diagnosis not present

## 2020-02-09 DIAGNOSIS — F3162 Bipolar disorder, current episode mixed, moderate: Secondary | ICD-10-CM | POA: Diagnosis not present

## 2020-02-09 DIAGNOSIS — F4001 Agoraphobia with panic disorder: Secondary | ICD-10-CM | POA: Diagnosis not present

## 2020-02-09 MED ORDER — TOPIRAMATE 25 MG PO TABS: ORAL_TABLET | ORAL | 1 refills | Status: DC
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
## 2020-02-11 ENCOUNTER — Encounter: Payer: Self-pay | Admitting: Adult Health

## 2020-02-11 ENCOUNTER — Other Ambulatory Visit: Payer: Self-pay

## 2020-02-11 ENCOUNTER — Ambulatory Visit (INDEPENDENT_AMBULATORY_CARE_PROVIDER_SITE_OTHER): Payer: PPO | Admitting: Adult Health

## 2020-02-11 ENCOUNTER — Ambulatory Visit (INDEPENDENT_AMBULATORY_CARE_PROVIDER_SITE_OTHER): Payer: PPO

## 2020-02-11 VITALS — BP 106/73 | HR 73 | Temp 98.2°F | Ht 61.5 in | Wt 189.6 lb

## 2020-02-11 DIAGNOSIS — U071 COVID-19: Secondary | ICD-10-CM

## 2020-02-11 DIAGNOSIS — J4541 Moderate persistent asthma with (acute) exacerbation: Secondary | ICD-10-CM

## 2020-02-11 DIAGNOSIS — R0600 Dyspnea, unspecified: Secondary | ICD-10-CM | POA: Diagnosis not present

## 2020-02-11 DIAGNOSIS — J1282 Pneumonia due to coronavirus disease 2019: Secondary | ICD-10-CM

## 2020-02-11 DIAGNOSIS — J45991 Cough variant asthma: Secondary | ICD-10-CM | POA: Diagnosis not present

## 2020-02-11 DIAGNOSIS — R42 Dizziness and giddiness: Secondary | ICD-10-CM

## 2020-02-11 DIAGNOSIS — J8410 Pulmonary fibrosis, unspecified: Secondary | ICD-10-CM | POA: Diagnosis not present

## 2020-02-11 LAB — CBC WITH DIFFERENTIAL/PLATELET
Basophils Absolute: 0 10*3/uL (ref 0.0–0.1)
Basophils Relative: 0.6 % (ref 0.0–3.0)
Eosinophils Absolute: 0.1 10*3/uL (ref 0.0–0.7)
Eosinophils Relative: 1.7 % (ref 0.0–5.0)
HCT: 44.5 % (ref 36.0–46.0)
Hemoglobin: 15.3 g/dL — ABNORMAL HIGH (ref 12.0–15.0)
Lymphocytes Relative: 24.5 % (ref 12.0–46.0)
Lymphs Abs: 1.3 10*3/uL (ref 0.7–4.0)
MCHC: 34.3 g/dL (ref 30.0–36.0)
MCV: 91.5 fl (ref 78.0–100.0)
Monocytes Absolute: 0.5 10*3/uL (ref 0.1–1.0)
Monocytes Relative: 8.8 % (ref 3.0–12.0)
Neutro Abs: 3.3 10*3/uL (ref 1.4–7.7)
Neutrophils Relative %: 64.4 % (ref 43.0–77.0)
Platelets: 321 10*3/uL (ref 150.0–400.0)
RBC: 4.87 Mil/uL (ref 3.87–5.11)
RDW: 13.6 % (ref 11.5–15.5)
WBC: 5.2 10*3/uL (ref 4.0–10.5)

## 2020-02-11 LAB — BASIC METABOLIC PANEL
BUN: 8 mg/dL (ref 6–23)
CO2: 26 mEq/L (ref 19–32)
Calcium: 9.5 mg/dL (ref 8.4–10.5)
Chloride: 104 mEq/L (ref 96–112)
Creatinine, Ser: 0.79 mg/dL (ref 0.40–1.20)
GFR: 73.16 mL/min (ref >60.00)
Glucose, Bld: 86 mg/dL (ref 70–99)
Potassium: 3.8 mEq/L (ref 3.5–5.1)
Sodium: 138 mEq/L (ref 135–145)

## 2020-02-11 NOTE — Progress Notes (Signed)
@Patient  ID: Brittany [name], female    DOB: 02-08-1956, 64 y.o.   MRN: [id number]  Chief Complaint  Patient presents with  . Acute Visit    COPD     Referring provider: Caren Macadam, MD  HPI: 64 year old female former smoker followed for asthma and allergic rhinitis Had COVID-19 pneumonia in 04/2019-she did require hospitalization received steroids, remdesivir, convalescent plasma and tocilizumab.  TEST/EVENTS :  CXR, 2 view 05/12/2019-left greater than right opacities and reticulation.  Possible posterior pleural effusion silhouetting hemidiaphragm  CTA chest 04/22/2019-left greater than right peripheral groundglass opacities.  No PE.  02/11/2020 Follow Up : Asthma  Patient presents for a follow-up visit for asthma.  She says over the last couple weeks she has had 2 episodes of dizziness, lightheaded, equillibrium off  after exercising, presyncopal feeling, cold sweat, no syncope. No chest pain. Felt some palpitations. Resolved in 32min. No dyspnea , cough or wheezing .  Has been doing well up until the last couple weeks.  Says over the last year she has gained about 20 to 30 pounds.  She did have COVID-19 pneumonia with critical illness in November 2020.  Feels that she did totally recovered but wanted to start getting more active and work on weight loss.  She is eating a more balanced diet.  Today in the office Orthostatics b/p today okay .  She remains on Symbicort twice daily, Singulair daily.  Says she is drinking plenty of fluids.  Was recently seen by cardiology for a follow-up from COVID-19 to make sure heart was okay. Echo EF 60-65%, normal PASP .  She denies any calf pain or hemoptysis. GERD on pepcid Twice daily says she is pretty well controlled on occasion has some breakthrough reflux. Has some postnasal drainage uses Zyrtec and Flonase daily.   Allergies  Allergen Reactions  . Aspirin Other (See Comments)    discomfort  . Codeine Itching  .  Sulfamethoxazole-Trimethoprim Other (See Comments)    Unknown reaction per pt  . Trileptal [Oxcarbazepine]     itching  . Trimethoprim Other (See Comments)    Unknown, per pt  . Ultram [Tramadol] Itching  . Vioxx [Rofecoxib]     Unknown per pt    Immunization History  Administered Date(s) Administered  . Influenza Split 04/26/2011  . Influenza Whole 03/18/2009, 03/18/2010, 03/18/2016  . Influenza,inj,Quad PF,6+ Mos 04/19/2014, 02/28/2017, 03/06/2018, 02/13/2019  . PFIZER SARS-COV-2 Vaccination 09/04/2019, 09/29/2019  . Pneumococcal Conjugate-13 03/10/2015  . Pneumococcal Polysaccharide-23 06/18/2005  . Tdap 12/04/2016  . Zoster Recombinat (Shingrix) 01/01/2018, 06/22/2018    Past Medical History:  Diagnosis Date  . Abnormal EKG   . Anxiety   . Asthma   . Bipolar depression ([place]), Anxiety, PTSD, Panic disorder    -managed by [facility]  . Depression   . Foot pain   . GERD (gastroesophageal reflux disease)   . Heart murmur    as a child  . Insomnia   . Leg swelling   . Osteoporosis   . Tachycardia     Tobacco History: Social History   Tobacco Use  Smoking Status Former Smoker  . Packs/day: 20.00  . Years: 1.00  . Pack years: 20.00  . Quit date: 06/19/1999  . Years since quitting: 20.6  Smokeless Tobacco Never Used  Tobacco Comment   20 year estimate but probably less    Counseling given: Not Answered Comment: 20 year estimate but probably less    Outpatient Medications Prior to Visit  Medication  Sig Dispense Refill  . albuterol (PROVENTIL) (2.5 MG/3ML) 0.083% nebulizer solution Take 3 mLs (2.5 mg total) by nebulization every 6 (six) hours as needed for wheezing or shortness of breath. 75 mL 12  . Albuterol Sulfate (PROAIR RESPICLICK) 220 (90 Base) MCG/ACT AEPB Inhale 2 puffs into the lungs every 6 (six) hours as needed. 1 each 1  . Biotin 1 MG CAPS Take by mouth.    . budesonide-formoterol (SYMBICORT) 160-4.5 MCG/ACT inhaler Inhale 2 puffs into  the lungs 2 (two) times daily. 1 Inhaler 11  . busPIRone (BUSPAR) 15 MG tablet Take 1 tablet (15 mg total) by mouth 3 (three) times daily. 270 tablet 0  . cetirizine (ZYRTEC) 10 MG tablet Take 1 tablet (10 mg total) by mouth daily. 90 tablet 1  . Cholecalciferol (VITAMIN D-3 PO) Take 10,000 Units by mouth daily.     . colestipol (COLESTID) 1 g tablet Take 1 tablet (1 g total) by mouth 2 (two) times daily. TAKE 2 TABLETS BY MOUTH TWICE DAILY 180 tablet 2  . diazepam (VALIUM) 5 MG tablet Take 1 tablet (5 mg total) by mouth every 6 (six) hours as needed for anxiety. (Patient taking differently: Take by mouth every 6 (six) hours as needed for anxiety. ) 120 tablet 3  . dicyclomine (BENTYL) 20 MG tablet Take 1 tablet (20 mg total) by mouth 4 (four) times daily as needed for spasms. 120 tablet 3  . doxazosin (CARDURA) 4 MG tablet TAKE TWO TABLETS BY MOUTH EVERY NIGHT AT BEDTIME (Patient taking differently: Take 4 mg by mouth at bedtime. ) 180 tablet 1  . famotidine (PEPCID) 20 MG tablet TAKE ONE TABLET BY MOUTH TWICE A DAY 180 tablet 1  . fluticasone (FLONASE) 50 MCG/ACT nasal spray Place 2 sprays into both nostrils 2 (two) times daily. 96 g 0  . mirtazapine (REMERON) 15 MG tablet TAKE ONE TABLET BY MOUTH EVERY NIGHT AT BEDTIME 90 tablet 0  . montelukast (SINGULAIR) 10 MG tablet TAKE ONE TABLET BY MOUTH AT BEDTIME 90 tablet 1  . Multiple Vitamin (MULTIVITAMIN WITH MINERALS) TABS tablet Take 1 tablet by mouth daily.    Marland Kitchen topiramate (TOPAMAX) 25 MG tablet 1 at night for 1 week, then 1 twice daily for 1 week, then 1 in the morning and 2 at night for 1 week then 2 twice daily 120 tablet 1   No facility-administered medications prior to visit.     Review of Systems:   Constitutional:   No  weight loss, night sweats,  Fevers, chills,  +fatigue, or  lassitude.  HEENT:   No headaches,  Difficulty swallowing,  Tooth/dental problems, or  Sore throat,                No sneezing, itching, ear ache, nasal  congestion, post nasal drip,   CV:  No chest pain,  Orthopnea, PND, swelling in lower extremities, anasarca, dizziness, palpitations, syncope.   GI  No heartburn, indigestion, abdominal pain, nausea, vomiting, diarrhea, change in bowel habits, loss of appetite, bloody stools.   Resp:    No excess mucus, no productive cough,  No non-productive cough,  No coughing up of blood.  No change in color of mucus.  No wheezing.  No chest wall deformity  Skin: no rash or lesions.  GU: no dysuria, change in color of urine, no urgency or frequency.  No flank pain, no hematuria   MS:  No joint pain or swelling.  No decreased range of motion.  No  back pain.    Physical Exam  BP 106/73 (BP Location: Left Arm, Cuff Size: Large)   Pulse 73   Temp 98.2 F (36.8 C) (Temporal)   Ht 5' 1.5" (1.562 m)   Wt 189 lb 9.6 oz (86 kg)   SpO2 97%   BMI 35.24 kg/m   GEN: A/Ox3; pleasant , NAD, well nourished    HEENT:  [place]/AT,  EACs-clear, TMs-wnl, NOSE-clear, THROAT-clear, no lesions, no postnasal drip or exudate noted.   NECK:  Supple w/ fair ROM; no JVD; normal carotid impulses w/o bruits; no thyromegaly or nodules palpated; no lymphadenopathy.    RESP  Clear  P & A; w/o, wheezes/ rales/ or rhonchi. no accessory muscle use, no dullness to percussion  CARD:  RRR, no m/r/g, no peripheral edema, pulses intact, no cyanosis or clubbing.  GI:   Soft & nt; nml bowel sounds; no organomegaly or masses detected.   Musco: Warm bil, no deformities or joint swelling noted.   Neuro: alert, no focal deficits noted.    Skin: Warm, no lesions or rashes    Lab Results:  CBC  BNP  ProBNP No results found for: PROBNP  Imaging: DG Chest 2 View  Result Date: 02/11/2020 CLINICAL DATA:  Dyspnea EXAM: CHEST - 2 VIEW COMPARISON:  May 12, 2019, April 22, 2019 FINDINGS: The cardiomediastinal silhouette is unchanged in contour.Revisualization of coarse interstitial opacities throughout the LEFT greater than  RIGHT lung. There is focal nodular consolidation along the LEFT lateral lung, similar in comparison to prior. Persistent scarring of the RIGHT upper lung. No pleural effusion. No pneumothorax. No new focal consolidation. Status post cholecystectomy. Multilevel degenerative changes of the thoracic spine. IMPRESSION: Revisualization of coarse interstitial opacities throughout the LEFT greater than RIGHT lung with additional areas of more focal consolidation likely reflecting scarring. Findings are consistent with pulmonary fibrosis. This is likely the sequela of COVID-19 infection. Electronically Signed   By: Valentino Saxon MD   On: 02/11/2020 12:46   ECHOCARDIOGRAM COMPLETE  Result Date: 01/26/2020    ECHOCARDIOGRAM REPORT   Patient Name:   [facility] Date of Exam: 01/26/2020 Medical Rec #:  [id number]             Height:       61.5 in Accession #:    [id number]            Weight:       190.0 lb Date of Birth:  1955/07/29             BSA:          1.859 m Patient Age:    42 years              BP:           161/86 mmHg Patient Gender: F                     HR:           70 bpm. Exam Location:  [place] Procedure: 2D Echo, Cardiac Doppler, Color Doppler and Intracardiac            Opacification Agent Indications:    R06.00 SOB  History:        Patient has prior history of Echocardiogram examinations, most                 recent 03/14/2006. Abnormal ECG; Signs/Symptoms:Murmur. COVID 19                 (  November 2020).  Sonographer:    Marygrace Drought RCS Referring Phys: Est[place]  1. Left ventricular ejection fraction, by estimation, is 60 to 65%. The left ventricle has normal function. The left ventricle has no regional wall motion abnormalities. There is mild concentric left ventricular hypertrophy and moderate basal septal hypertrophy.  2. Right ventricular systolic function is mildly reduced. The right ventricular size is normal. There is normal pulmonary artery systolic  pressure.  3. The mitral valve is normal in structure. No evidence of mitral valve regurgitation. No evidence of mitral stenosis.  4. The aortic valve is normal in structure. Aortic valve regurgitation is not visualized. No aortic stenosis is present.  5. The inferior vena cava is normal in size with greater than 50% respiratory variability, suggesting right atrial pressure of 3 mmHg. FINDINGS  Left Ventricle: Left ventricular ejection fraction, by estimation, is 60 to 65%. The left ventricle has normal function. The left ventricle has no regional wall motion abnormalities. The average left ventricular global longitudinal strain is -22.6 %. The global longitudinal strain is normal. The left ventricular internal cavity size was normal in size. There is mild concentric left ventricular hypertrophy. Left ventricular diastolic parameters are consistent with Grade I diastolic dysfunction (impaired relaxation). Elevated left ventricular end-diastolic pressure. Right Ventricle: The right ventricular size is normal. No increase in right ventricular wall thickness. Right ventricular systolic function is mildly reduced. There is normal pulmonary artery systolic pressure. The tricuspid regurgitant velocity is 2.08 m/s, and with an assumed right atrial pressure of 3 mmHg, the estimated right ventricular systolic pressure is 76.7 mmHg. Left Atrium: Left atrial size was normal in size. Right Atrium: Right atrial size was normal in size. Pericardium: There is no evidence of pericardial effusion. Mitral Valve: The mitral valve is normal in structure. Normal mobility of the mitral valve leaflets. No evidence of mitral valve regurgitation. No evidence of mitral valve stenosis. Tricuspid Valve: The tricuspid valve is normal in structure. Tricuspid valve regurgitation is trivial. No evidence of tricuspid stenosis. Aortic Valve: The aortic valve is normal in structure. Aortic valve regurgitation is not visualized. No aortic stenosis is  present. Pulmonic Valve: The pulmonic valve was normal in structure. Pulmonic valve regurgitation is trivial. No evidence of pulmonic stenosis. Aorta: The aortic root is normal in size and structure. Venous: The inferior vena cava is normal in size with greater than 50% respiratory variability, suggesting right atrial pressure of 3 mmHg. IAS/Shunts: No atrial level shunt detected by color flow Doppler.  LEFT VENTRICLE PLAX 2D LVIDd:         3.70 cm  Diastology LVIDs:         2.50 cm  LV e' lateral:   8.27 cm/s LV PW:         1.20 cm  LV E/e' lateral: 8.9 LV IVS:        1.40 cm  LV e' medial:    3.81 cm/s LVOT diam:     2.20 cm  LV E/e' medial:  19.2 LV SV:         78 LV SV Index:   42       2D Longitudinal Strain LVOT Area:     3.80 cm 2D Strain GLS (A2C):   -20.2 %                         2D Strain GLS (A3C):   -30.3 %  2D Strain GLS (A4C):   -17.2 %                         2D Strain GLS Avg:     -22.6 % RIGHT VENTRICLE RV Basal diam:  3.60 cm RV S prime:     10.30 cm/s TAPSE (M-mode): 1.6 cm RVSP:           20.3 mmHg LEFT ATRIUM             Index       RIGHT ATRIUM           Index LA diam:        3.30 cm 1.77 cm/m  RA Pressure: 3.00 mmHg LA Vol (A2C):   24.1 ml 12.96 ml/m RA Area:     12.50 cm LA Vol (A4C):   35.0 ml 18.82 ml/m RA Volume:   29.10 ml  15.65 ml/m LA Biplane Vol: 28.9 ml 15.54 ml/m  AORTIC VALVE LVOT Vmax:   97.90 cm/s LVOT Vmean:  72.500 cm/s LVOT VTI:    0.204 m  AORTA Ao Root diam: 2.90 cm Ao Asc diam:  2.90 cm MITRAL VALVE               TRICUSPID VALVE MV Area (PHT):             TR Peak grad:   17.3 mmHg MV Decel Time:             TR Vmax:        208.00 cm/s MV E velocity: 73.30 cm/s  Estimated RAP:  3.00 mmHg MV A velocity: 86.50 cm/s  RVSP:           20.3 mmHg MV E/A ratio:  0.85                            SHUNTS                            Systemic VTI:  0.20 m                            Systemic Diam: 2.20 cm Fransico Him MD Electronically signed by Fransico Him  MD Signature Date/Time: 01/26/2020/12:00:58 PM    Final     perflutren lipid microspheres (DEFINITY) IV suspension    Date Action Dose Route User   01/26/2020 1025 Given  Intravenous (Right Arm) Trey Paula      PFT Results Latest Ref Rng & Units 09/03/2019  FVC-Pre L 2.55  FVC-Predicted Pre % 86  FVC-Post L 2.71  FVC-Predicted Post % 91  Pre FEV1/FVC % % 70  Post FEV1/FCV % % 67  FEV1-Pre L 1.78  FEV1-Predicted Pre % 78  FEV1-Post L 1.83  DLCO uncorrected ml/min/mmHg 16.97  DLCO UNC% % 91  DLCO corrected ml/min/mmHg 16.97  DLCO COR %Predicted % 91  DLVA Predicted % 98  TLC L 4.48  TLC % Predicted % 94  RV % Predicted % 99    Lab Results  Component Value Date   NITRICOXIDE 14 07/16/2016        Assessment & Plan:   Cough variant asthma  vs UACS  Asthma appears under good control with no flare of cough or wheezing.  We will check chest x-ray today.  Previous chest x-ray did show  some postinflammatory fibrotic changes after COVID-19 pneumonia. Pulmonary function testing in March did show normal lung function and DLCO.  Plan  Patient Instructions  Check chest xray and labs today .  Continue with Symbicort Twice daily, rinse after use.  Albuterol As needed   Saline nasal rinses Twice daily   Afrin nasal x 3 days .  Follow up with PCP provider for Dizziness.  Follow up with Dr. Carlis Abbott as planned and As needed   Please contact office for sooner follow up if symptoms do not improve or worsen or seek emergency care        Pneumonia due to COVID-19 virus Follow-up chest x-ray today  Dizziness Exercise associated dizziness ? Etiology .  We will check lab work and chest x-ray today.  Patient seems to be drinking plenty of fluids.  Eating well.  Have advised her to contact her primary care provider for further evaluation.  Advised to return back to normal activities and avoid heavy exercise for now.  Recent cardiology evaluation with normal echo.     Rexene Edison, NP 02/11/2020

## 2020-02-11 NOTE — Assessment & Plan Note (Signed)
Asthma appears under good control with no flare of cough or wheezing.  We will check chest x-ray today.  Previous chest x-ray did show some postinflammatory fibrotic changes after COVID-19 pneumonia. Pulmonary function testing in March did show normal lung function and DLCO.  Plan  Patient Instructions  Check chest xray and labs today .  Continue with Symbicort Twice daily, rinse after use.  Albuterol As needed   Saline nasal rinses Twice daily   Afrin nasal x 3 days .  Follow up with PCP provider for Dizziness.  Follow up with Dr. Carlis Abbott as planned and As needed   Please contact office for sooner follow up if symptoms do not improve or worsen or seek emergency care

## 2020-02-11 NOTE — Assessment & Plan Note (Signed)
Follow-up chest x-ray today

## 2020-02-11 NOTE — Patient Instructions (Addendum)
Check chest xray and labs today .  Continue with Symbicort Twice daily, rinse after use.  Albuterol As needed   Saline nasal rinses Twice daily   Afrin nasal x 3 days .  Follow up with PCP provider for Dizziness.  Follow up with Dr. Carlis Abbott as planned and As needed   Please contact office for sooner follow up if symptoms do not improve or worsen or seek emergency care

## 2020-02-11 NOTE — Assessment & Plan Note (Signed)
Exercise associated dizziness ? Etiology .  We will check lab work and chest x-ray today.  Patient seems to be drinking plenty of fluids.  Eating well.  Have advised her to contact her primary care provider for further evaluation.  Advised to return back to normal activities and avoid heavy exercise for now.  Recent cardiology evaluation with normal echo.

## 2020-02-12 ENCOUNTER — Other Ambulatory Visit: Payer: Self-pay | Admitting: Psychiatry

## 2020-02-12 DIAGNOSIS — F4310 Post-traumatic stress disorder, unspecified: Secondary | ICD-10-CM

## 2020-02-15 ENCOUNTER — Other Ambulatory Visit: Payer: Self-pay | Admitting: Gastroenterology

## 2020-02-16 ENCOUNTER — Telehealth: Payer: Self-pay | Admitting: Gastroenterology

## 2020-02-16 ENCOUNTER — Other Ambulatory Visit: Payer: Self-pay

## 2020-02-16 DIAGNOSIS — R140 Abdominal distension (gaseous): Secondary | ICD-10-CM

## 2020-02-16 DIAGNOSIS — K219 Gastro-esophageal reflux disease without esophagitis: Secondary | ICD-10-CM

## 2020-02-16 DIAGNOSIS — K6389 Other specified diseases of intestine: Secondary | ICD-10-CM

## 2020-02-16 DIAGNOSIS — R197 Diarrhea, unspecified: Secondary | ICD-10-CM

## 2020-02-16 DIAGNOSIS — E739 Lactose intolerance, unspecified: Secondary | ICD-10-CM

## 2020-02-16 MED ORDER — DICYCLOMINE HCL 20 MG PO TABS: 20.0000 mg | ORAL_TABLET | Freq: Four times a day (QID) | ORAL | 3 refills | Status: DC | PRN

## 2020-02-16 NOTE — Telephone Encounter (Signed)
Refill sent to pharmacy.   

## 2020-02-17 NOTE — Telephone Encounter (Signed)
Spoke with pt and she is aware of DOD recommendations. Pt not happy, she is under the impression that SIBO never goes away that she has to alter her diet to decrease flares but may need treatment when she does have a flare. She states she is not able to eat hardly anything and she is getting weak. She would like to wait and see what Dr. Tarri Glenn recommends regarding the doxycycline. She knows Dr. Tarri Glenn is out of town and wants to wait for her reply. Please advise.

## 2020-02-17 NOTE — Telephone Encounter (Signed)
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 -I hate to give her another course of antibiotics empirically -Lets get SIBO breath test first.  Arva Chafe, can you please arrange that.

## 2020-02-17 NOTE — Telephone Encounter (Signed)
[place] pt states her stomach is burning, she is having bad acid reflux and diarrhea. She is requesting a refill of doxycycline that Dr. Tarri Glenn has given her in the past to treat SIBO.   Note from virtual visit in [place] states :  Called in May with recurrent symptoms with explosive diarrhea and lots of gas. Requested another trial of doxycycline 100 mg BID x 14 days.  Initially felt better, but, symptoms recurred within 2 weeks.   As DOD please advise.

## 2020-02-18 NOTE — Telephone Encounter (Signed)
I agree with the recommendations for SIBO testing given the recurrent nature of her symptoms. The results may allow [place] to better tailor her treatment therapy. Thank you.

## 2020-02-18 NOTE — Telephone Encounter (Signed)
Spoke with pt and she is aware, will pick up kit. Kit at front desk for pt.

## 2020-02-18 NOTE — Telephone Encounter (Signed)
Pt notified that the test will be ordered under Dr. Tarri Glenn name.

## 2020-02-18 NOTE — Telephone Encounter (Signed)
Patient is calling, she said that she was going to pick up the SIBO test, but she wants to know if it will be under Dr. Tarri Glenn name or Dr. Lyndel Safe. states she called her insurance and they wont cover 100% unless it is under her doctors name as the referring physician.

## 2020-02-19 ENCOUNTER — Telehealth (INDEPENDENT_AMBULATORY_CARE_PROVIDER_SITE_OTHER): Payer: PPO | Admitting: Family Medicine

## 2020-02-19 ENCOUNTER — Encounter: Payer: Self-pay | Admitting: Family Medicine

## 2020-02-19 DIAGNOSIS — R05 Cough: Secondary | ICD-10-CM

## 2020-02-19 DIAGNOSIS — J019 Acute sinusitis, unspecified: Secondary | ICD-10-CM | POA: Diagnosis not present

## 2020-02-19 DIAGNOSIS — R059 Cough, unspecified: Secondary | ICD-10-CM

## 2020-02-19 DIAGNOSIS — Z7189 Other specified counseling: Secondary | ICD-10-CM

## 2020-02-19 MED ORDER — AMOXICILLIN 500 MG PO TABS: 500.0000 mg | ORAL_TABLET | Freq: Two times a day (BID) | ORAL | 0 refills | Status: AC

## 2020-02-19 NOTE — Progress Notes (Signed)
Virtual Visit via Video Note  I connected with Brittany [name] on 02/19/20 at  2:00 PM EDT by a video enabled telemedicine application 2/2 [id number] pandemic and verified that I am speaking with the correct person using two identifiers.  Location patient: home Location provider:work or home office Persons participating in the virtual visit: patient, provider  I discussed the limitations of evaluation and management by telemedicine and the availability of in person appointments. The patient expressed understanding and agreed to proceed.   HPI: Pt is a 64 yo female with pmh sig asthma, allergies.  With sore throat, HA, ear ache R>L, pressure in head/face, cough, fatigue, chills x 3 wks. tried ibuprofen, flonase, nasal decongestant, and saline mist.  Pt denies wheezing.  Pt has not been around her daughter and her husband, but they are sick with a "summer cold".  Pt goes out for appts and grocery store.  Pt had COVID in Nov. Feels the same but can breathe.  Pt fully vaccinated.  Seen by Pulm last wk. Had chest x-ray done.  Patient states she she is hoping to be better by next weekend for her daughter's baby shower.  This will be her daughter's first child.  ROS: See pertinent positives and negatives per HPI.  Past Medical History:  Diagnosis Date  . Abnormal EKG   . Anxiety   . Asthma   . Bipolar depression ([place]), Anxiety, PTSD, Panic disorder    -managed by [facility]  . Depression   . Foot pain   . GERD (gastroesophageal reflux disease)   . Heart murmur    as a child  . Insomnia   . Leg swelling   . Osteoporosis   . Tachycardia     Past Surgical History:  Procedure Laterality Date  . CARPAL TUNNEL RELEASE Left   . CESAREAN SECTION     x 2  . CHOLECYSTECTOMY    . ENDOMETRIAL ABLATION    . KNEE SURGERY Right    reconstruction for patellar dislocation  . LAPAROSCOPY     x 2  . TUBAL LIGATION    . ULNAR NERVE REPAIR Left     Family History  Problem Relation Age  of Onset  . Heart failure Mother   . Hypertension Mother   . Hyperlipidemia Mother   . Congestive Heart Failure Mother   . Other Father        killed  . COPD Paternal 81        a lot of aunts and uncle COPD or Emphysema  . Emphysema Paternal Uncle   . Colon polyps Maternal Aunt   . COPD Brother   . Heart disease Brother   . Pulmonary embolism Brother   . Breast cancer Neg Hx   . Colon cancer Neg Hx   . Esophageal cancer Neg Hx   . Pancreatic cancer Neg Hx   . Stomach cancer Neg Hx   . Liver disease Neg Hx      Current Outpatient Medications:  .  albuterol (PROVENTIL) (2.5 MG/3ML) 0.083% nebulizer solution, Take 3 mLs (2.5 mg total) by nebulization every 6 (six) hours as needed for wheezing or shortness of breath., Disp: 75 mL, Rfl: 12 .  Albuterol Sulfate (PROAIR RESPICLICK) 269 (90 Base) MCG/ACT AEPB, Inhale 2 puffs into the lungs every 6 (six) hours as needed., Disp: 1 each, Rfl: 1 .  Biotin 1 MG CAPS, Take by mouth., Disp: , Rfl:  .  budesonide-formoterol (SYMBICORT) 160-4.5 MCG/ACT inhaler, Inhale 2 puffs into  the lungs 2 (two) times daily., Disp: 1 Inhaler, Rfl: 11 .  busPIRone (BUSPAR) 15 MG tablet, TAKE ONE TABLET BY MOUTH THREE TIMES A DAY, Disp: 270 tablet, Rfl: 0 .  cetirizine (ZYRTEC) 10 MG tablet, Take 1 tablet (10 mg total) by mouth daily., Disp: 90 tablet, Rfl: 1 .  Cholecalciferol (VITAMIN D-3 PO), Take 10,000 Units by mouth daily. , Disp: , Rfl:  .  colestipol (COLESTID) 1 g tablet, Take 1 tablet (1 g total) by mouth 2 (two) times daily. TAKE 2 TABLETS BY MOUTH TWICE DAILY, Disp: 180 tablet, Rfl: 2 .  diazepam (VALIUM) 5 MG tablet, Take 1 tablet (5 mg total) by mouth every 6 (six) hours as needed for anxiety. (Patient taking differently: Take by mouth every 6 (six) hours as needed for anxiety. ), Disp: 120 tablet, Rfl: 3 .  dicyclomine (BENTYL) 20 MG tablet, Take 1 tablet (20 mg total) by mouth 4 (four) times daily as needed for spasms., Disp: 120 tablet, Rfl: 3 .   doxazosin (CARDURA) 4 MG tablet, TAKE TWO TABLETS BY MOUTH EVERY NIGHT AT BEDTIME (Patient taking differently: Take 4 mg by mouth at bedtime. ), Disp: 180 tablet, Rfl: 1 .  famotidine (PEPCID) 20 MG tablet, TAKE ONE TABLET BY MOUTH TWICE A DAY, Disp: 180 tablet, Rfl: 1 .  fluticasone (FLONASE) 50 MCG/ACT nasal spray, Place 2 sprays into both nostrils 2 (two) times daily., Disp: 96 g, Rfl: 0 .  mirtazapine (REMERON) 15 MG tablet, TAKE ONE TABLET BY MOUTH EVERY NIGHT AT BEDTIME, Disp: 90 tablet, Rfl: 0 .  montelukast (SINGULAIR) 10 MG tablet, TAKE ONE TABLET BY MOUTH AT BEDTIME, Disp: 90 tablet, Rfl: 1 .  Multiple Vitamin (MULTIVITAMIN WITH MINERALS) TABS tablet, Take 1 tablet by mouth daily., Disp: , Rfl:  .  topiramate (TOPAMAX) 25 MG tablet, 1 at night for 1 week, then 1 twice daily for 1 week, then 1 in the morning and 2 at night for 1 week then 2 twice daily, Disp: 120 tablet, Rfl: 1  EXAM:  VITALS per patient if applicable: RR between 35-00 bpm  GENERAL: alert, oriented, appears well and in no acute distress  HEENT: atraumatic, conjunctiva clear, no obvious abnormalities on inspection of external nose and ears  NECK: normal movements of the head and neck  LUNGS: on inspection no signs of respiratory distress, breathing rate appears normal, no obvious gross SOB, gasping or wheezing  CV: no obvious cyanosis  MS: moves all visible extremities without noticeable abnormality  PSYCH/NEURO: pleasant and cooperative, no obvious depression or anxiety, speech and thought processing grossly intact  ASSESSMENT AND PLAN:  Discussed the following assessment and plan:  Subacute sinusitis, unspecified location -Okay to continue nasal sprays - Plan: amoxicillin (AMOXIL) 500 MG tablet  Cough -Chest x-ray from 02/11/20 with post inflammatory fibrosis 2/2 COVID-19 infection -Patient advised to get a Covid test.  Given information about area locations providing testing. -Self quarantine  encouraged -Continue inhalers -Okay to take OTC cough medicine such as plain Robitussin -For continued or worsening symptoms follow-up with pulmonology  Educated about COVID-19 virus infection -Discussed signs and symptoms of COVID-19 virus infection -Discussed precautions -Given information about Covid testing in the area   Follow-up as needed with PCP  I discussed the assessment and treatment plan with the patient. The patient was provided an opportunity to ask questions and all were answered. The patient agreed with the plan and demonstrated an understanding of the instructions.   The patient was advised to  call back or seek an in-person evaluation if the symptoms worsen or if the condition fails to improve as anticipated.  I provided 20 minutes of non-face-to-face time during this encounter.   Billie Ruddy, MD

## 2020-02-23 ENCOUNTER — Ambulatory Visit: Payer: PPO

## 2020-02-23 ENCOUNTER — Telehealth: Payer: Self-pay | Admitting: Gastroenterology

## 2020-02-23 NOTE — Telephone Encounter (Signed)
I am always happy to provide telehealth appointment if covered by her insurance. Thank you.

## 2020-02-23 NOTE — Telephone Encounter (Signed)
Left detailed message on patient's voicemail letting her know that Dr. Tarri Glenn can see her by telemedicine. Advised patient that she will need to call her insurance company to see if the visit will be covered and to give [place] a call back once she has that information.

## 2020-02-23 NOTE — Telephone Encounter (Signed)
Dr. Tarri Glenn, see below. Thanks

## 2020-02-24 NOTE — Telephone Encounter (Signed)
Pt returned call, pt requested to be scheduled for a video visit to discuss questions regarding her breath test. Pt states that she is going to hold off doing her breath test until she speaks with Dr. Tarri Glenn. Pt is scheduled for a My chart video visit on 03/18/20 at 3:40 pm.

## 2020-02-25 DIAGNOSIS — Z03818 Encounter for observation for suspected exposure to other biological agents ruled out: Secondary | ICD-10-CM | POA: Diagnosis not present

## 2020-02-25 DIAGNOSIS — Z20822 Contact with and (suspected) exposure to covid-19: Secondary | ICD-10-CM | POA: Diagnosis not present

## 2020-02-29 ENCOUNTER — Encounter: Payer: PPO | Attending: Gastroenterology | Admitting: Dietician

## 2020-02-29 ENCOUNTER — Other Ambulatory Visit: Payer: Self-pay

## 2020-02-29 ENCOUNTER — Encounter: Payer: Self-pay | Admitting: Dietician

## 2020-02-29 DIAGNOSIS — K219 Gastro-esophageal reflux disease without esophagitis: Secondary | ICD-10-CM | POA: Insufficient documentation

## 2020-02-29 DIAGNOSIS — E739 Lactose intolerance, unspecified: Secondary | ICD-10-CM | POA: Insufficient documentation

## 2020-02-29 DIAGNOSIS — Z713 Dietary counseling and surveillance: Secondary | ICD-10-CM | POA: Insufficient documentation

## 2020-02-29 DIAGNOSIS — R197 Diarrhea, unspecified: Secondary | ICD-10-CM | POA: Insufficient documentation

## 2020-02-29 DIAGNOSIS — R140 Abdominal distension (gaseous): Secondary | ICD-10-CM | POA: Insufficient documentation

## 2020-02-29 DIAGNOSIS — K9089 Other intestinal malabsorption: Secondary | ICD-10-CM | POA: Diagnosis not present

## 2020-02-29 NOTE — Progress Notes (Signed)
Medical Nutrition Therapy:  Appt start time: 1030 end time:  2376.   Assessment:  Primary concerns today:  Patient is here today alone for follow up of diarrhea.  She was last seen by myself 01/22/2020.  She would like to learn what to eat to better tolerate food. She reports starting a new medication for sleep and anxiety and this has made her foggy. She states that she ate a piece of carrot cake a week ago and has had more problems with diarrhea since.  She states that she definitely does not tolerate anything with sugar (may be a quantity issue). She has not followed the [place] closely and states that her stomach won't improve until she is given an antibiotic. Has found that she tolerates rice cakes with peanut butter, barely rip banana and cinnamon as well as air popped popcorn well. She tried lemon juice in water but this caused increased stomach acid.  She states that she will tolerate an egg today but not tomorrow.  She avoids sugar at times and dairy as these cause diarrhea.   High Fiber causes diarrhea. Tolerates small to moderate amounts of vegetables.  Tolerates small amounts of fruit. Tolerates monk fruit and stevia. She does not tolerate sugar alcohol or sucralose which cause diarrhea. Excessive caffeine bother's her stomach. She states that problems with diarrhea started 1 1/2 years after her gallbladder was removed but related this to getting older. She complains of excessive fatigue as well as inability to sleep without Remeron. She would like to lose weight but this is not her ultimate purpose. She states that she eats the same thing much of the time.  She states that if she looks at the big picture, this bothers her but if she looks at it day to day she copes with this.  Intake is limited due to intolerances.  History includes explosive diarrhea, abdominal pain, bloating, SIBO, GERD, asthma, covid 04/2019 She sees Dr. Tarri Glenn at [facility]. She is  on medication for the diarrhea but states that this works less than it did in the past.  She states that her appetite varies.  She forgets to eat at times and when she does eat she is hungry even if she has just eaten. She states that her appetite has been out of control since COVID 04/2019.  She lost 10 lbs then but regained 30 lbs. She was trying to stay away from white carbs such as bread, pasta, potatoes do not hurt her stomach. She lost 40 lbs on keto 2 years ago but regained.  190 lbs 01/22/2020 115 lbs lowest weight early 20's 211 lbs highest weight in 2011 when she had increased amounts of stress  Patient lives alone.  She was taken out of work in 2010 due to PTSD/anxiety.  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  Patient's daughter has a BS in nutrition. Money is extra tight and goes to a [employer] once a week for a food box.  This is due to increased hospital bills post covid.  She reports that much of this food is processed and she often gives much of this away.  Preferred Learning Style:   No preference indicated   Learning Readiness:   Ready  Change in progress   MEDICATIONS: see list to include MVI, biotin, vitamin D, Remeron (which she takes for sleep)  - this is an appetite stimulant   DIETARY INTAKE: 01/2020  Has  tried GF at times but finds this expensive. Never a breakfast person and will often eat a late breakfast and early dinner. 24-hr recall:  B ( AM): toasted peanut butter sandwich on 2 slices keto friendly Nani Gasser bread OR eggs with plant based cheese with or without cheese Snk ( AM):   L ( PM): skips 4 days per week, plant based cheese sandwich, lunch meat sandwich OR chicken breast, frozen broccoli, cauliflower or spinach Snk ( PM):  D ( PM): similar to lunch Snk ( PM):  Beverages: coffee (half caffeine), water, water with mio  Progress Towards Goal(s):  In progress.   Nutritional  Diagnosis:  NB-1.1 Food and nutrition-related knowledge deficit As related to nutrition, intolerances, balance.  As evidenced by diet hx and patient report.    Intervention:  Nutrition education related to her gastrointestinal symptoms. Discussed the benefit of following a strict plan and increasing slowly to identify foods tolerated or not tolerated.   Will mail additional resource option.  Plan: If your symptoms do not resolve with further medical treatment, consider strict retrial for the [place] for 2 weeks.  Simplify your diet as much as possible. Meal plan 2 breakfasts that are easy to prepare than can be your morning options. Do another trial of gluten.  If you tolerate gluten without symptoms, include this back into your diet. Continue to avoid dairy and foods with dairy.  Consider website for more meal ideas:  ALittleBitYummy.com  Reintroduce a new food every 2 days. As you reintroduce new foods, keep detailed food symptom records to identify all triggering foods that cause symptoms. Each day, you should record:  Your food and beverage intake When you ate and drank How much you ate and drank Your symptoms Type of symptom Severity of symptom Timing or onset of symptom  Teaching Method Utilized:  Visual Auditory  Barriers to learning/adherence to lifestyle change: food limits/intolerance  Demonstrated degree of understanding via:  Teach Back   Monitoring/Evaluation:  Dietary intake, exercise, and body weight in 1 month(s).

## 2020-03-01 ENCOUNTER — Ambulatory Visit
Admission: RE | Admit: 2020-03-01 | Discharge: 2020-03-01 | Disposition: A | Discharge status: Home / Self Care | Payer: PPO | Source: Ambulatory Visit | Attending: Family Medicine | Admitting: Family Medicine

## 2020-03-01 DIAGNOSIS — Z1231 Encounter for screening mammogram for malignant neoplasm of breast: Secondary | ICD-10-CM

## 2020-03-01 NOTE — Patient Instructions (Signed)
If your symptoms do not resolve with further medical treatment, consider strict retrial for the [place] for 2 weeks.  Simplify your diet as much as possible. Meal plan 2 breakfasts that are easy to prepare than can be your morning options. Do another trial of gluten.  If you tolerate gluten without symptoms, include this back into your diet. Continue to avoid dairy and foods with dairy.  Consider website for more meal ideas:  ALittleBitYummy.com  Reintroduce a new food every 2 days. As you reintroduce new foods, keep detailed food symptom records to identify all triggering foods that cause symptoms. Each day, you should record:  Your food and beverage intake When you ate and drank How much you ate and drank Your symptoms Type of symptom Severity of symptom Timing or onset of symptom

## 2020-03-04 ENCOUNTER — Ambulatory Visit (INDEPENDENT_AMBULATORY_CARE_PROVIDER_SITE_OTHER): Payer: PPO | Admitting: Critical Care Medicine

## 2020-03-04 ENCOUNTER — Encounter: Payer: Self-pay | Admitting: Critical Care Medicine

## 2020-03-04 ENCOUNTER — Telehealth: Payer: Self-pay | Admitting: Critical Care Medicine

## 2020-03-04 ENCOUNTER — Other Ambulatory Visit: Payer: Self-pay

## 2020-03-04 VITALS — BP 102/78 | HR 90 | Temp 98.2°F | Ht 61.5 in | Wt 188.2 lb

## 2020-03-04 DIAGNOSIS — Z23 Encounter for immunization: Secondary | ICD-10-CM

## 2020-03-04 DIAGNOSIS — R0602 Shortness of breath: Secondary | ICD-10-CM | POA: Diagnosis not present

## 2020-03-04 NOTE — Patient Instructions (Addendum)
Thank you for visiting Dr. Carlis Abbott at [facility]. We recommend the following:  Orders Placed This Encounter  Procedures  . CT Chest High Resolution   Orders Placed This Encounter  Procedures  . CT Chest High Resolution    First available    Standing Status:   Future    Standing Expiration Date:   03/04/2021    Order Specific Question:   Preferred imaging location?    Answer:   [facility]    Order Specific Question:   Radiology Contrast Protocol - do NOT remove file path    Answer:   [email or web address]   Keep taking Symbicort 2 times daily. Rinse your mouth after every use. Keep taking albuterol as needed.  Keep all allergy medications the same.    Return in about 3 months (around 06/03/2020). with Dr. Erin Fulling (30 min visit).    Please do your part to reduce the spread of COVID-19.

## 2020-03-04 NOTE — Telephone Encounter (Signed)
Thanks for letting me know.  LPC

## 2020-03-04 NOTE — Progress Notes (Signed)
Synopsis: Referred in 2018 for asthma by Brittany Macadam, MD.  Previously patient of Dr. Melvyn [name] and Dr. Gwenette [name].   Subjective:   PATIENT ID: Brittany [name] GENDER: female DOB: 1955-08-05, MRN: [id number]  No chief complaint on file.    [name] is a 64 year old woman with a history of asthma who presents for follow-up.  She recently had an office visit on 02/11/2019 on with NP Brittany [name] (note reviewed).  At that time her asthma was well controlled.  She was on Symbicort twice daily, albuterol only as needed.  She was using saline sinus rinses and Afrin for short period due to upper airway cough syndrome from postnasal drip.  She reports that she notices allergy symptoms, and these are her baseline.  She has been afraid to exercise since having an episode of lightheadedness when getting off the treadmill about a month ago.  She felt like she had overdone it by doing her exercises in bed without resting before getting on the treadmill.  She did not require albuterol to control her breathing, rather focused on slowing her breathing consciously.  She wants to know if she can safely resume exercise.  Humidity seems to make her symptoms worse, so she prefers to exercise on a treadmill.  She had Covid in November 2020 with persistent symptoms.  She had follow-up chest x-ray abnormalities while in August 2021.  She has not yet had a CT scan to evaluate these abnormalities.  She was recently evaluated by cardiology 01/08/2020-felt the dyspnea is more related to pulmonary issues than cardiac; fairly normal echocardiogram.     OV 09/03/19: Ms. Brittany [name] is a 64 y/o woman following up for asthma and allergic rhinitis. She had an exacerbation of her asthma with persistent symptoms following Covid pneumonia in 2020.  Her symptoms have continued to improve.  Her allergies have been worse than normal recently due to pollen, but she is overall breathing better. Her asthma is better controlled-her cough and  shortness of breath are both improved.  She is needing albuterol about once per month, less and less over time.  She sometimes feels this though she needs it during exercise.  She is continued on Symbicort twice daily, Singulair, Zyrtec, Flonase.  She was unable to afford azelastine nasal spray.  She has some ongoing throat congestion hoarseness after talking for a while that persists since she had Covid.   OV 06/17/2019: Mrs. Brittany [name] is a 64 year old woman with a history of asthma who presents for follow-up.  Her fevers she was having at her last visit resolved after a course of doxycycline.  She has been feeling well.  She has been off her oxygen for 8 days now, and has been monitoring her saturations at home.  Her dyspnea on exertion has been slowly improving, and she is getting faster and more independent with her ADLs.  For the past 2 days she is no longer needed to sit down while showering.  At her last visit we escalate her therapy from Flovent to [facility] twice daily.  She continues on Singulair, Zyrtec, and Flonase.  Overall she is doing well without wheezing or dyspnea on exertion.  Her cough is less than it was at her baseline prior to Covid.  She has had some yellow sputum for the last few days, but is not having more sputum than her usual or coughing more frequently.  Her baseline sputum is clear.  She has some ongoing allergies and postnasal drip with more rhinorrhea and sneezing  the last few days.  She denies fever, chills, sweats.  She has pet cats, which she does not want to get rid of.  She knows she is allergic to them.  She continues to allow them to sleep in her bed.  She is to air purifiers in her house.  She is continue to avoid tobacco since quitting 30 years ago.    televisit 05/20/2019: Mrs. Brittany [name] is a 64 year old woman who presents for follow-up.  She has a history of lifelong asthma.  She is recovering from recent COVID-19 viral pneumonia.  She was hospitalized at [facility] from  November 3 to November 14.  She was discharged on home oxygen, which she has been using regularly.  She continues to use it at night, but is able to stay off of it sometimes during the day.  Currently her saturations are 93%.  While hospitalized she received steroids, remdesivir, convalescent plasma and Tocilizumab.  She was discharged on Eliquis for her high D-dimer, but did not have a confirmed DVT.  Her primary care physician is following her D-dimer to determine the appropriate time to stop Eliquis.  Since discharge she has had periodic epistaxis, which she has been managing with Neosporin on the inside her nose.  Her asthma was not fully controlled prior to her hospitalization, and it has been more noticeable since discharge.  It is improving; she has not used in a nebulizer at all today.  At first when she came home she was using them 3 times a day, was able to go down to twice a day.  She has had a fever since yesterday, and thinks that she may have a sinus infection.  She was recently prescribed doxycycline for small bowel bacterial overgrowth, but she is hoping this will help her sinuses.  She has chronic allergies to dust, mold, and cats.  She refuses to give up her cats.  She follows with Dr. Fredderick [name] from allergy and immunology.  She has care filters in her house to help with her allergies.  She is on Flovent twice daily with albuterol nebulizers.  She has been on Symbicort in the past, but had trouble affording this medication.  She has wheezing and shortness of breath.  Exercise triggers her asthma symptoms.      Flowsheet data:  Lab Results  Component Value Date   NITRICOXIDE 14 07/16/2016    Past Medical History:  Diagnosis Date  . Abnormal EKG   . Anxiety   . Asthma   . Bipolar depression ([place]), Anxiety, PTSD, Panic disorder    -managed by [facility]  . Depression   . Foot pain   . GERD (gastroesophageal reflux disease)   . Heart murmur    as a child  . Insomnia    . Leg swelling   . Osteoporosis   . Tachycardia      Family History  Problem Relation Age of Onset  . Heart failure Mother   . Hypertension Mother   . Hyperlipidemia Mother   . Congestive Heart Failure Mother   . Other Father        killed  . COPD Paternal 59        a lot of aunts and uncle COPD or Emphysema  . Emphysema Paternal Uncle   . Colon polyps Maternal Aunt   . COPD Brother   . Heart disease Brother   . Pulmonary embolism Brother   . Breast cancer Neg Hx   . Colon cancer Neg Hx   .  Esophageal cancer Neg Hx   . Pancreatic cancer Neg Hx   . Stomach cancer Neg Hx   . Liver disease Neg Hx      Past Surgical History:  Procedure Laterality Date  . BREAST BIOPSY Right    biopsy of nipple  . CARPAL TUNNEL RELEASE Left   . CESAREAN SECTION     x 2  . CHOLECYSTECTOMY    . ENDOMETRIAL ABLATION    . KNEE SURGERY Right    reconstruction for patellar dislocation  . LAPAROSCOPY     x 2  . TUBAL LIGATION    . ULNAR NERVE REPAIR Left     Social History   Socioeconomic History  . Marital status: Single    Spouse name: Not on file  . Number of children: 3  . Years of education: Not on file  . Highest education level: Not on file  Occupational History  . Occupation: disabled  Tobacco Use  . Smoking status: Former Smoker    Packs/day: 20.00    Years: 1.00    Pack years: 20.00    Quit date: 06/19/1999    Years since quitting: 20.7  . Smokeless tobacco: Never Used  . Tobacco comment: 20 year estimate but probably less   Vaping Use  . Vaping Use: Never used  Substance and Sexual Activity  . Alcohol use: Yes    Alcohol/week: 0.0 standard drinks    Comment: Occass.   . Drug use: No  . Sexual activity: Never  Other Topics Concern  . Not on file  Social History Narrative   Work or School: Disabled seconndary to psychiatric conditions      Home Situation: lives alone with 2 cats and one dog      Spiritual Beliefs: Christian      Lifestyle: no regular  exercise, diet  Not great - wants to embark on healthier lifestyle   Social Determinants of Health   Financial Resource Strain:   . Difficulty of Paying Living Expenses: Not on file  Food Insecurity:   . Worried About Charity fundraiser in the Last Year: Not on file  . Ran Out of Food in the Last Year: Not on file  Transportation Needs:   . Lack of Transportation (Medical): Not on file  . Lack of Transportation (Non-Medical): Not on file  Physical Activity:   . Days of Exercise per Week: Not on file  . Minutes of Exercise per Session: Not on file  Stress:   . Feeling of Stress : Not on file  Social Connections:   . Frequency of Communication with Friends and Family: Not on file  . Frequency of Social Gatherings with Friends and Family: Not on file  . Attends Religious Services: Not on file  . Active Member of Clubs or Organizations: Not on file  . Attends Archivist Meetings: Not on file  . Marital Status: Not on file  Intimate Partner Violence:   . Fear of Current or Ex-Partner: Not on file  . Emotionally Abused: Not on file  . Physically Abused: Not on file  . Sexually Abused: Not on file     Allergies  Allergen Reactions  . Aspirin Other (See Comments)    discomfort  . Codeine Itching  . Sulfamethoxazole-Trimethoprim Other (See Comments)    Unknown reaction per pt  . Trileptal [Oxcarbazepine]     itching  . Trimethoprim Other (See Comments)    Unknown, per pt  . Ultram [Tramadol] Itching  .  Vioxx [Rofecoxib]     Unknown per pt     Immunization History  Administered Date(s) Administered  . Influenza Split 04/26/2011  . Influenza Whole 03/18/2009, 03/18/2010, 03/18/2016  . Influenza,inj,Quad PF,6+ Mos 04/19/2014, 02/28/2017, 03/06/2018, 02/13/2019  . PFIZER SARS-COV-2 Vaccination 09/04/2019, 09/29/2019  . Pneumococcal Conjugate-13 03/10/2015  . Pneumococcal Polysaccharide-23 06/18/2005  . Tdap 12/04/2016  . Zoster Recombinat (Shingrix) 01/01/2018,  06/22/2018    Outpatient Medications Prior to Visit  Medication Sig Dispense Refill  . albuterol (PROVENTIL) (2.5 MG/3ML) 0.083% nebulizer solution Take 3 mLs (2.5 mg total) by nebulization every 6 (six) hours as needed for wheezing or shortness of breath. 75 mL 12  . Albuterol Sulfate (PROAIR RESPICLICK) 676 (90 Base) MCG/ACT AEPB Inhale 2 puffs into the lungs every 6 (six) hours as needed. 1 each 1  . Biotin 1 MG CAPS Take by mouth.    . budesonide-formoterol (SYMBICORT) 160-4.5 MCG/ACT inhaler Inhale 2 puffs into the lungs 2 (two) times daily. 1 Inhaler 11  . busPIRone (BUSPAR) 15 MG tablet TAKE ONE TABLET BY MOUTH THREE TIMES A DAY 270 tablet 0  . cetirizine (ZYRTEC) 10 MG tablet Take 1 tablet (10 mg total) by mouth daily. 90 tablet 1  . Cholecalciferol (VITAMIN D-3 PO) Take 10,000 Units by mouth daily.     . colestipol (COLESTID) 1 g tablet Take 1 tablet (1 g total) by mouth 2 (two) times daily. TAKE 2 TABLETS BY MOUTH TWICE DAILY 180 tablet 2  . diazepam (VALIUM) 5 MG tablet Take 1 tablet (5 mg total) by mouth every 6 (six) hours as needed for anxiety. (Patient taking differently: Take by mouth every 6 (six) hours as needed for anxiety. ) 120 tablet 3  . dicyclomine (BENTYL) 20 MG tablet Take 1 tablet (20 mg total) by mouth 4 (four) times daily as needed for spasms. 120 tablet 3  . doxazosin (CARDURA) 4 MG tablet TAKE TWO TABLETS BY MOUTH EVERY NIGHT AT BEDTIME (Patient taking differently: Take 4 mg by mouth at bedtime. ) 180 tablet 1  . famotidine (PEPCID) 20 MG tablet TAKE ONE TABLET BY MOUTH TWICE A DAY 180 tablet 1  . fluticasone (FLONASE) 50 MCG/ACT nasal spray Place 2 sprays into both nostrils 2 (two) times daily. 96 g 0  . mirtazapine (REMERON) 15 MG tablet TAKE ONE TABLET BY MOUTH EVERY NIGHT AT BEDTIME 90 tablet 0  . montelukast (SINGULAIR) 10 MG tablet TAKE ONE TABLET BY MOUTH AT BEDTIME 90 tablet 1  . Multiple Vitamin (MULTIVITAMIN WITH MINERALS) TABS tablet Take 1 tablet by  mouth daily.    Marland Kitchen topiramate (TOPAMAX) 25 MG tablet 1 at night for 1 week, then 1 twice daily for 1 week, then 1 in the morning and 2 at night for 1 week then 2 twice daily 120 tablet 1   No facility-administered medications prior to visit.    Review of Systems  Constitutional: Positive for fever.  HENT: Positive for congestion and nosebleeds.   Respiratory: Positive for cough and wheezing.      Objective:   There were no vitals filed for this visit.   on  RA BMI Readings from Last 3 Encounters:  02/11/20 35.24 kg/m  01/22/20 35.32 kg/m  01/08/20 35.72 kg/m   Wt Readings from Last 3 Encounters:  02/11/20 189 lb 9.6 oz (86 kg)  01/22/20 190 lb (86.2 kg)  01/08/20 192 lb 2.2 oz (87.2 kg)    Physical Exam Vitals reviewed.  Constitutional:      Appearance:  She is obese. She is not ill-appearing.  HENT:     Head: Normocephalic and atraumatic.  Eyes:     General: No scleral icterus. Cardiovascular:     Rate and Rhythm: Normal rate and regular rhythm.     Heart sounds: No murmur heard.   Pulmonary:     Comments: CTAB, breathing comfortably on RA. No conversational dyspnea. Abdominal:     General: There is no distension.     Palpations: Abdomen is soft.  Musculoskeletal:        General: No swelling or deformity.     Cervical back: Neck supple.  Lymphadenopathy:     Cervical: No cervical adenopathy.  Skin:    General: Skin is warm and dry.     Findings: No rash.  Neurological:     General: No focal deficit present.     Mental Status: She is alert.     Coordination: Coordination normal.  Psychiatric:        Mood and Affect: Mood normal.        Behavior: Behavior normal.      CBC    Component Value Date/Time   WBC 5.2 02/11/2020 1237   RBC 4.87 02/11/2020 1237   HGB 15.3 (H) 02/11/2020 1237   HCT 44.5 02/11/2020 1237   PLT 321.0 02/11/2020 1237   MCV 91.5 02/11/2020 1237   MCH 29.2 05/02/2019 0252   MCHC 34.3 02/11/2020 1237   RDW 13.6 02/11/2020  1237   LYMPHSABS 1.3 02/11/2020 1237   MONOABS 0.5 02/11/2020 1237   EOSABS 0.1 02/11/2020 1237   BASOSABS 0.0 02/11/2020 1237     Chest Imaging- films reviewed: CXR, 2 view 05/12/2019-left greater than right opacities and reticulation.  Possible posterior pleural effusion silhouetting hemidiaphragm  CTA chest 04/22/2019-left greater than right peripheral groundglass opacities.  No PE.  CXR 02/11/2020-increased interstitial markings throughout  Echocardiogram 01/26/20: LVEF 60 to 65%, no regional wall motion abnormalities.  Mild concentric LVH, moderate basal septal hypertrophy.  Normal LA.  RV with mildly reduced function, normal size.  Normal RA.  Normal valves.  Pulmonary Functions Testing Results: PFT Results Latest Ref Rng & Units 09/03/2019  FVC-Pre L 2.55  FVC-Predicted Pre % 86  FVC-Post L 2.71  FVC-Predicted Post % 91  Pre FEV1/FVC % % 70  Post FEV1/FCV % % 67  FEV1-Pre L 1.78  FEV1-Predicted Pre % 78  FEV1-Post L 1.83  DLCO uncorrected ml/min/mmHg 16.97  DLCO UNC% % 91  DLCO corrected ml/min/mmHg 16.97  DLCO COR %Predicted % 91  DLVA Predicted % 98  TLC L 4.48  TLC % Predicted % 94  RV % Predicted % 99   2021- no significant obstruction or bronchodilator reversibility.  No restriction, hyperinflation, or air trapping.  Normal diffusion.     Assessment & Plan:     ICD-10-CM   1. Need for immunization against influenza  Z23 Flu Vaccine QUAD 36+ mos IM  2. Shortness of breath  R06.02 CT Chest High Resolution    Chronic dyspnea on exertion.  PFTs do not support a diagnosis of uncontrolled asthma to explain her symptoms. 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 -Continue Symbicort twice daily.  Continue rinse her mouth after every use. -Continue albuterol as needed -Recommend HRCT chest to further evaluate for post Covid fibrosis.  At this point there would not necessarily be a treatment, but if she demonstrated progression over time she would potentially be a  candidate for antifibrotic therapy. -Continue Singulair daily. -Flu  shot today.  Up-to-date on Covid and pneumococcal vaccinations -Safe to resume exercise at her own pace.  Discussed that this should be slow to start, and she needs to be cautious given her history of what sound like presyncopal symptoms with her last episode.  I cautioned against exercising on the treadmill given her increased risk of injuries if she were to fall.  Recommend walking outside in the cool est part of the day and gradually increasing her distance as her exercise tolerance improves  History of COVID-19 viral pneumonia causing acute hypoxic respiratory failure-resolved -HRCT to further evaluate CXR abnormalities--she is informed this afternoon this is noncemented she will be able to obtain.  Can obtain repeat chest x-ray to evaluate for progression follow-up.  Allergic rhinosinusitis -Continue Zyrtec and Flonase   RTC in 3 months with Dr. Erin Fulling.  40 minutes spent on this encounter, including >50% face to face.   Current Outpatient Medications:  .  albuterol (PROVENTIL) (2.5 MG/3ML) 0.083% nebulizer solution, Take 3 mLs (2.5 mg total) by nebulization every 6 (six) hours as needed for wheezing or shortness of breath., Disp: 75 mL, Rfl: 12 .  Albuterol Sulfate (PROAIR RESPICLICK) 009 (90 Base) MCG/ACT AEPB, Inhale 2 puffs into the lungs every 6 (six) hours as needed., Disp: 1 each, Rfl: 1 .  Biotin 1 MG CAPS, Take by mouth., Disp: , Rfl:  .  budesonide-formoterol (SYMBICORT) 160-4.5 MCG/ACT inhaler, Inhale 2 puffs into the lungs 2 (two) times daily., Disp: 1 Inhaler, Rfl: 11 .  busPIRone (BUSPAR) 15 MG tablet, TAKE ONE TABLET BY MOUTH THREE TIMES A DAY, Disp: 270 tablet, Rfl: 0 .  cetirizine (ZYRTEC) 10 MG tablet, Take 1 tablet (10 mg total) by mouth daily., Disp: 90 tablet, Rfl: 1 .  Cholecalciferol (VITAMIN D-3 PO), Take 10,000 Units by mouth daily. , Disp: , Rfl:  .  colestipol (COLESTID) 1 g tablet, Take 1  tablet (1 g total) by mouth 2 (two) times daily. TAKE 2 TABLETS BY MOUTH TWICE DAILY, Disp: 180 tablet, Rfl: 2 .  diazepam (VALIUM) 5 MG tablet, Take 1 tablet (5 mg total) by mouth every 6 (six) hours as needed for anxiety. (Patient taking differently: Take by mouth every 6 (six) hours as needed for anxiety. ), Disp: 120 tablet, Rfl: 3 .  dicyclomine (BENTYL) 20 MG tablet, Take 1 tablet (20 mg total) by mouth 4 (four) times daily as needed for spasms., Disp: 120 tablet, Rfl: 3 .  doxazosin (CARDURA) 4 MG tablet, TAKE TWO TABLETS BY MOUTH EVERY NIGHT AT BEDTIME (Patient taking differently: Take 4 mg by mouth at bedtime. ), Disp: 180 tablet, Rfl: 1 .  famotidine (PEPCID) 20 MG tablet, TAKE ONE TABLET BY MOUTH TWICE A DAY, Disp: 180 tablet, Rfl: 1 .  fluticasone (FLONASE) 50 MCG/ACT nasal spray, Place 2 sprays into both nostrils 2 (two) times daily., Disp: 96 g, Rfl: 0 .  mirtazapine (REMERON) 15 MG tablet, TAKE ONE TABLET BY MOUTH EVERY NIGHT AT BEDTIME, Disp: 90 tablet, Rfl: 0 .  montelukast (SINGULAIR) 10 MG tablet, TAKE ONE TABLET BY MOUTH AT BEDTIME, Disp: 90 tablet, Rfl: 1 .  Multiple Vitamin (MULTIVITAMIN WITH MINERALS) TABS tablet, Take 1 tablet by mouth daily., Disp: , Rfl:  .  topiramate (TOPAMAX) 25 MG tablet, 1 at night for 1 week, then 1 twice daily for 1 week, then 1 in the morning and 2 at night for 1 week then 2 twice daily, Disp: 120 tablet, Rfl: 1   [name],  DO [facility] 03/04/2020 7:27 AM

## 2020-03-11 ENCOUNTER — Other Ambulatory Visit: Payer: Self-pay | Admitting: Psychiatry

## 2020-03-14 ENCOUNTER — Other Ambulatory Visit: Payer: Self-pay | Admitting: Psychiatry

## 2020-03-14 ENCOUNTER — Other Ambulatory Visit: Payer: Self-pay | Admitting: Critical Care Medicine

## 2020-03-14 DIAGNOSIS — F4001 Agoraphobia with panic disorder: Secondary | ICD-10-CM

## 2020-03-15 NOTE — Telephone Encounter (Signed)
Last note mentions change in directions?

## 2020-03-16 ENCOUNTER — Telehealth: Payer: Self-pay | Admitting: Critical Care Medicine

## 2020-03-16 NOTE — Telephone Encounter (Signed)
Spoke to pt & she wants to go ahead and schedule CT and she states will call billing office once she gets statement.  Will schedule CT for pt.  Nothing further needed.

## 2020-03-18 ENCOUNTER — Telehealth (INDEPENDENT_AMBULATORY_CARE_PROVIDER_SITE_OTHER): Payer: PPO | Admitting: Gastroenterology

## 2020-03-18 ENCOUNTER — Encounter: Payer: Self-pay | Admitting: Gastroenterology

## 2020-03-18 VITALS — Ht 61.5 in | Wt 186.0 lb

## 2020-03-18 DIAGNOSIS — K6389 Other specified diseases of intestine: Secondary | ICD-10-CM | POA: Diagnosis not present

## 2020-03-18 DIAGNOSIS — R197 Diarrhea, unspecified: Secondary | ICD-10-CM | POA: Diagnosis not present

## 2020-03-18 DIAGNOSIS — K219 Gastro-esophageal reflux disease without esophagitis: Secondary | ICD-10-CM

## 2020-03-18 DIAGNOSIS — R140 Abdominal distension (gaseous): Secondary | ICD-10-CM | POA: Diagnosis not present

## 2020-03-18 MED ORDER — PANTOPRAZOLE SODIUM 40 MG PO TBEC: 40.0000 mg | DELAYED_RELEASE_TABLET | Freq: Two times a day (BID) | ORAL | 3 refills | Status: DC

## 2020-03-18 NOTE — Patient Instructions (Addendum)
If you are age 64 or older, your body mass index should be between 23-30. Your Body mass index is 34.58 kg/m. If this is out of the aforementioned range listed, please consider follow up with your Primary Care Provider.  If you are age 59 or younger, your body mass index should be between 19-25. Your Body mass index is 34.58 kg/m. If this is out of the aformentioned range listed, please consider follow up with your Primary Care Provider.   We have sent the following medications to your pharmacy for you to pick up at your convenience:  START: pantoprazole 62m one tablet twice daily  You have been given a testing kit to check for small intestine bacterial overgrowth (SIBO) which is completed by a company named [facility]. Make sure to return your test in the mail using the return mailing label given to you along with the kit. Your demographic and insurance information have already been sent to the company and they should be in contact with you over the next week regarding this test. [facility] will collect an upfront charge of $99.74 for commercial insurance plans and $209.74 is you are paying cash. Make sure to discuss with [facility] PRIOR to having the test if they have gotten informatoin from your insurance company as to how much your testing will cost out of pocket, if any. Please keep in mind that you will be getting a call from phone number [phone] a similar number. If you do not hear from them within this time frame, please call our office at [phone]   You will follow up with our office in 3 - 6 months or earlier as needed.  Please contact our office for an appointment.   Thank you for entrusting me with your care and [facility]  Dr BTarri Glenn   Gastroesophageal Reflux Disease, Adult Gastroesophageal reflux (GER) happens when acid from the stomach flows up into the tube that connects the mouth and the stomach (esophagus). Normally, food  travels down the esophagus and stays in the stomach to be digested. However, when a person has GER, food and stomach acid sometimes move back up into the esophagus. If this becomes a more serious problem, the person may be diagnosed with a disease called gastroesophageal reflux disease (GERD). GERD occurs when the reflux:  Happens often.  Causes frequent or severe symptoms.  Causes problems such as damage to the esophagus. When stomach acid comes in contact with the esophagus, the acid may cause soreness (inflammation) in the esophagus. Over time, GERD may create small holes (ulcers) in the lining of the esophagus. What are the causes? This condition is caused by a problem with the muscle between the esophagus and the stomach (lower esophageal sphincter, or LES). Normally, the LES muscle closes after food passes through the esophagus to the stomach. When the LES is weakened or abnormal, it does not close properly, and that allows food and stomach acid to go back up into the esophagus. The LES can be weakened by certain dietary substances, medicines, and medical conditions, including:  Tobacco use.  Pregnancy.  Having a hiatal hernia.  Alcohol use.  Certain foods and beverages, such as coffee, chocolate, onions, and peppermint. What increases the risk? You are more likely to develop this condition if you:  Have an increased body weight.  Have a connective tissue disorder.  Use NSAID medicines. What are the signs or symptoms? Symptoms of this condition include:  Heartburn.  Difficult or painful swallowing.  The feeling of having a lump in the throat.  Abitter taste in the mouth.  Bad breath.  Having a large amount of saliva.  Having an upset or bloated stomach.  Belching.  Chest pain. Different conditions can cause chest pain. Make sure you see your health care provider if you experience chest pain.  Shortness of breath or wheezing.  Ongoing (chronic) cough or a  night-time cough.  Wearing away of tooth enamel.  Weight loss. How is this diagnosed? Your health care provider will take a medical history and perform a physical exam. To determine if you have mild or severe GERD, your health care provider may also monitor how you respond to treatment. You may also have tests, including:  A test to examine your stomach and esophagus with a small camera (endoscopy).  A test thatmeasures the acidity level in your esophagus.  A test thatmeasures how much pressure is on your esophagus.  A barium swallow or modified barium swallow test to show the shape, size, and functioning of your esophagus. How is this treated? The goal of treatment is to help relieve your symptoms and to prevent complications. Treatment for this condition may vary depending on how severe your symptoms are. Your health care provider may recommend:  Changes to your diet.  Medicine.  Surgery. Follow these instructions at home: Eating and drinking   Follow a diet as recommended by your health care provider. This may involve avoiding foods and drinks such as: ? Coffee and tea (with or without caffeine). ? Drinks that containalcohol. ? Energy drinks and sports drinks. ? Carbonated drinks or sodas. ? Chocolate and cocoa. ? Peppermint and mint flavorings. ? Garlic and onions. ? Horseradish. ? Spicy and acidic foods, including peppers, chili powder, curry powder, vinegar, hot sauces, and barbecue sauce. ? Citrus fruit juices and citrus fruits, such as oranges, lemons, and limes. ? Tomato-based foods, such as red sauce, chili, salsa, and pizza with red sauce. ? Fried and fatty foods, such as donuts, french fries, potato chips, and high-fat dressings. ? High-fat meats, such as hot dogs and fatty cuts of red and white meats, such as rib eye steak, sausage, ham, and bacon. ? High-fat dairy items, such as whole milk, butter, and cream cheese.  Eat small, frequent meals instead of  large meals.  Avoid drinking large amounts of liquid with your meals.  Avoid eating meals during the 2-3 hours before bedtime.  Avoid lying down right after you eat.  Do not exercise right after you eat. Lifestyle   Do not use any products that contain nicotine or tobacco, such as cigarettes, e-cigarettes, and chewing tobacco. If you need help quitting, ask your health care provider.  Try to reduce your stress by using methods such as yoga or meditation. If you need help reducing stress, ask your health care provider.  If you are overweight, reduce your weight to an amount that is healthy for you. Ask your health care provider for guidance about a safe weight loss goal. General instructions  Pay attention to any changes in your symptoms.  Take over-the-counter and prescription medicines only as told by your health care provider. Do not take aspirin, ibuprofen, or other NSAIDs unless your health care provider told you to do so.  Wear loose-fitting clothing. Do not wear anything tight around your waist that causes pressure on your abdomen.  Raise (elevate) the head of your bed about 6 inches (15 cm).  Avoid bending over if this makes your symptoms  worse.  Keep all follow-up visits as told by your health care provider. This is important. Contact a health care provider if:  You have: ? New symptoms. ? Unexplained weight loss. ? Difficulty swallowing or it hurts to swallow. ? Wheezing or a persistent cough. ? A hoarse voice.  Your symptoms do not improve with treatment. Get help right away if you:  Have pain in your arms, neck, jaw, teeth, or back.  Feel sweaty, dizzy, or light-headed.  Have chest pain or shortness of breath.  Vomit and your vomit looks like blood or coffee grounds.  Faint.  Have stool that is bloody or black.  Cannot swallow, drink, or eat. Summary  Gastroesophageal reflux happens when acid from the stomach flows up into the esophagus. GERD is a  disease in which the reflux happens often, causes frequent or severe symptoms, or causes problems such as damage to the esophagus.  Treatment for this condition may vary depending on how severe your symptoms are. Your health care provider may recommend diet and lifestyle changes, medicine, or surgery.  Contact a health care provider if you have new or worsening symptoms.  Take over-the-counter and prescription medicines only as told by your health care provider. Do not take aspirin, ibuprofen, or other NSAIDs unless your health care provider told you to do so.  Keep all follow-up visits as told by your health care provider. This is important. This information is not intended to replace advice given to you by your health care provider. Make sure you discuss any questions you have with your health care provider. Document Revised: 12/11/2017 Document Reviewed: 12/11/2017 Elsevier Patient Education  [place].

## 2020-03-18 NOTE — Progress Notes (Signed)
TELEHEALTH VISIT  Referring Provider: Caren Macadam, MD Primary Care Physician:  Caren Macadam, MD   Tele-visit due to COVID-19 pandemic Patient requested visit virtually, consented to the virtual encounter via video enabled telemedicine application (EPIC) Contact made at: 03/18/20 13:50 Patient verified by name and date of birth Location of patient: Home Location provider: [facility] Names of persons participating: Me, patient Time spent on telehealth visit: 33 minutes I discussed the limitations of evaluation and management by telemedicine. The patient expressed understanding and agreed to proceed.  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 Collene Mares in 2009, treated with Align    - Normal duodenal and colon biopsies 04/09/2018    - using Immodium PRN.     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 Collene Mares 2009    - no polyps on colonoscopy 04/09/2018  Cholecystectomy for cholelithiasis 16 years ago  SIBO +/- d-IBS:  Symptoms dramatically improved with empiric trial  of rifaxamin/doxycycline but return within 2 weeks of completing treatment. Continue colestipol. Trial of dicylomine recommended. Continue to avoid food triggers. Nutrition consult recommended given her challenges identifying food options.  Recommend repeat breath testing to tailor treatment appropriately.  Moderate reflux on esophagram: Initially diagnosed in 2008 as part of asthma evaluation. Failed trial off of famotidine. Reviewed diagnosis. Given her progressive symptom, start pantoprazole 40 mg BID in addition to famotidine.  PLAN: Continue to avoid dairy and other food triggers Continue colestipol Trial of pantoprazole 40 mg BID in addition to famotidine Reviewed GERD lifestyle modifications Consider FDGard if symptoms persist Glucose breath test for SIBO Follow-up in 3-6 months, or earlier as needed  I spent over 35 minutes, including in depth chart review, face-to-face time with the patient, coordinating care, and ordering studies and medications as appropriate, and documentation.  HPI: Jawana Laysha Childers is a 64 y.o. female who is seen in a follow-up virtual visit.  Incomplete response to trial of colestipol. She did not tolerate 2 BID due to severe constipation. She is taking 2 cholestipol in the morning and frequently 1 pill in the evening. She will titrate the dose to avoid further constipation.  Continues to have up to 5-7 formed BM daily. At it's worst, she was having 10-15 BM daily. Occasional mucous. No blood. Abdominal pain continues to be diffuse, can be severe, feels like stabbing or punching. Improved with defecation.  Associated, near constant bloating and early satiety. Bothered by a large amount of ongoing gas.   Two weeks of Xifaxan provided significant relief in her symptoms. She had no significant bloating or diarrhea  during this time.  However all symptoms returned within 2 weeks of completing treatment.   Lactulose testing for small intestinal bacterial  overgrowth 04/14/2019 showed suspected bacterial overgrowth.  Increase in hydrogen was 12 ppm, increase in methane 3 ppm, increase in combined hydrogen and methane 15 ppm which is the cutoff for diagnosis.  Her GI symptoms worsened while hospitalized for Covid in November 2020 as she did not receive her colestipol.  Resuming treatment following discharge improved some of her symptoms.  However she continues to have 5-7 formed bowel movements daily and significant bloating.  Symptoms are similar to those that she experienced prior to empiric treatment with Xifaxan. She has gained 20 pounds and reports no energy since Covid.   Called in May with recurrent symptoms with explosive diarrhea and lots of gas. Requested another trial of doxycycline 100 mg BID x 14 days.  Initially felt better, but, symptoms recurred within 2 weeks.   Has identified dairy products and sugar as triggers. Switching to plant-based dairy products has provided some relief.  Has noticed some worsening symptoms on sugar substitutes.   Notes that she tried to stop her famotidine a couple of weeks ago but developed recurrent reflux. Symptoms resolved after resuming famotidine.   Continues to have episodes of severe abdominal pain and cramping triggered by daily products, sugars, or sugar substitutes including sugar alcohols. Has to lie in the fetal position with severe symptoms.  No noted association with mammalian meats. Now with worsening of her reflux that she hadn't really experienced prior to cholecystectomy.  Google suggested that she drink ginger tea, and with ginger tea she is able to increase her diet. Until today, this was helping with both the pain and the gas.  Notes that things are worsened by stress.   Seen by nutritionist. Bonne Dolores a low FODMAP diet but it is difficult due to complexity and cost.  Stressed by the cost of medical bills.   Concerned that she might have an ulcer due to the association of stress with her  symptoms.    Labs from 02/11/20: normal CBC and BMP  Prior endoscopy: Upper endoscopy and colonoscopy 04/09/2018  revealed pancolonic diverticulosis and was otherwise normal. Gastric biopsy showed chronic inactive gastritis with no evidence of H. pylori.  Duodenum biopsies and random colon biopsies were normal  Past Medical History:  Diagnosis Date  . Abnormal EKG   . Anxiety   . Asthma   . Bipolar depression ([place]), Anxiety, PTSD, Panic disorder    -managed by [facility]  . Depression   . Foot pain   . GERD (gastroesophageal reflux disease)   . Heart murmur    as a child  . Insomnia   . Leg swelling   . Osteoporosis   . Tachycardia     Past Surgical History:  Procedure Laterality Date  . BREAST BIOPSY Right    biopsy of nipple  . CARPAL TUNNEL RELEASE Left   . CESAREAN SECTION     x 2  . CHOLECYSTECTOMY    . ENDOMETRIAL ABLATION    . KNEE SURGERY Right    reconstruction for patellar dislocation  . LAPAROSCOPY     x 2  . TUBAL LIGATION    . ULNAR NERVE REPAIR Left     Current Outpatient Medications  Medication Sig Dispense Refill  . albuterol (PROVENTIL) (2.5 MG/3ML) 0.083% nebulizer solution Take 3 mLs (2.5 mg total) by nebulization every 6 (six) hours as needed for wheezing or shortness of breath.  75 mL 12  . Albuterol Sulfate (PROAIR RESPICLICK) 735 (90 Base) MCG/ACT AEPB Inhale 2 puffs into the lungs every 6 (six) hours as needed. 1 each 1  . Biotin 1 MG CAPS Take by mouth.    . budesonide-formoterol (SYMBICORT) 160-4.5 MCG/ACT inhaler Inhale 2 puffs into the lungs 2 (two) times daily. 1 Inhaler 11  . busPIRone (BUSPAR) 15 MG tablet TAKE ONE TABLET BY MOUTH THREE TIMES A DAY 270 tablet 0  . cetirizine (ZYRTEC) 10 MG tablet Take 1 tablet (10 mg total) by mouth daily. 90 tablet 1  . Cholecalciferol (VITAMIN D-3 PO) Take 10,000 Units by mouth daily.     . colestipol (COLESTID) 1 g tablet Take 1 g by mouth as directed. Takes 2 tablets in the am and 1 tablet  in the evening    . diazepam (VALIUM) 5 MG tablet TAKE ONE TABLET BY MOUTH EVERY 6 HOURS AS NEEDED FOR ANXIETY 120 tablet 3  . dicyclomine (BENTYL) 20 MG tablet Take 1 tablet (20 mg total) by mouth 4 (four) times daily as needed for spasms. 120 tablet 3  . doxazosin (CARDURA) 4 MG tablet TAKE TWO TABLETS BY MOUTH EVERY NIGHT AT BEDTIME (Patient taking differently: Take 4 mg by mouth at bedtime. ) 180 tablet 1  . famotidine (PEPCID) 20 MG tablet TAKE ONE TABLET BY MOUTH TWICE A DAY 180 tablet 1  . fluticasone (FLONASE) 50 MCG/ACT nasal spray SPRAY TWO SPRAYS IN EACH NOSTRIL TWICE DAILY 96 mL 2  . mirtazapine (REMERON) 15 MG tablet TAKE ONE TABLET BY MOUTH EVERY NIGHT AT BEDTIME 90 tablet 0  . montelukast (SINGULAIR) 10 MG tablet TAKE ONE TABLET BY MOUTH AT BEDTIME 90 tablet 1  . Multiple Vitamin (MULTIVITAMIN WITH MINERALS) TABS tablet Take 1 tablet by mouth daily.    Marland Kitchen topiramate (TOPAMAX) 25 MG tablet 1 at night for 1 week, then 1 twice daily for 1 week, then 1 in the morning and 2 at night for 1 week then 2 twice daily 120 tablet 1   No current facility-administered medications for this visit.    Allergies as of 03/18/2020 - Review Complete 03/18/2020  Allergen Reaction Noted  . Aspirin Other (See Comments)   . Codeine Itching 09/26/2017  . Sulfamethoxazole-trimethoprim Other (See Comments)   . Trileptal [oxcarbazepine]  11/28/2018  . Trimethoprim Other (See Comments) 11/12/2014  . Ultram [tramadol] Itching 09/26/2017  . Vioxx [rofecoxib]      Family History  Problem Relation Age of Onset  . Heart failure Mother   . Hypertension Mother   . Hyperlipidemia Mother   . Congestive Heart Failure Mother   . Other Father        killed  . COPD Paternal 11        a lot of aunts and uncle COPD or Emphysema  . Emphysema Paternal Uncle   . Colon polyps Maternal Aunt   . COPD Brother   . Heart disease Brother   . Pulmonary embolism Brother   . Breast cancer Neg Hx   . Colon cancer Neg  Hx   . Esophageal cancer Neg Hx   . Pancreatic cancer Neg Hx   . Stomach cancer Neg Hx   . Liver disease Neg Hx     Social History   Socioeconomic History  . Marital status: Single    Spouse name: Not on file  . Number of children: 3  . Years of education: Not on file  . Highest education level: Not on  file  Occupational History  . Occupation: disabled  Tobacco Use  . Smoking status: Former Smoker    Packs/day: 20.00    Years: 1.00    Pack years: 20.00    Quit date: 06/19/1999    Years since quitting: 20.7  . Smokeless tobacco: Never Used  . Tobacco comment: 20 year estimate but probably less   Vaping Use  . Vaping Use: Never used  Substance and Sexual Activity  . Alcohol use: Yes    Alcohol/week: 0.0 standard drinks    Comment: Occass.   . Drug use: No  . Sexual activity: Never  Other Topics Concern  . Not on file  Social History Narrative   Work or School: Disabled seconndary to psychiatric conditions      Home Situation: lives alone with 2 cats and one dog      Spiritual Beliefs: Christian      Lifestyle: no regular exercise, diet  Not great - wants to embark on healthier lifestyle   Social Determinants of Health   Financial Resource Strain:   . Difficulty of Paying Living Expenses: Not on file  Food Insecurity:   . Worried About Charity fundraiser in the Last Year: Not on file  . Ran Out of Food in the Last Year: Not on file  Transportation Needs:   . Lack of Transportation (Medical): Not on file  . Lack of Transportation (Non-Medical): Not on file  Physical Activity:   . Days of Exercise per Week: Not on file  . Minutes of Exercise per Session: Not on file  Stress:   . Feeling of Stress : Not on file  Social Connections:   . Frequency of Communication with Friends and Family: Not on file  . Frequency of Social Gatherings with Friends and Family: Not on file  . Attends Religious Services: Not on file  . Active Member of Clubs or Organizations: Not on  file  . Attends Archivist Meetings: Not on file  . Marital Status: Not on file  Intimate Partner Violence:   . Fear of Current or Ex-Partner: Not on file  . Emotionally Abused: Not on file  . Physically Abused: Not on file  . Sexually Abused: Not on file    Physical Exam: Complete physical exam not performed due to the limits inherent in a telehealth encounter.  General: Awake, alert, and oriented, and well communicative. In no acute distress.  HEENT: EOMI, non-icteric sclera, NCAT, MMM  Neck: Normal movement of head and neck  Pulm: No labored breathing, speaking in full sentences without conversational dyspnea  Derm: No apparent lesions or bruising in visible field  MS: Moves all visible extremities without noticeable abnormality  Psych: Pleasant, cooperative, normal speech, normal affect and normal insight Neuro: Alert and appropriate   Elvira Langston L. Tarri Glenn, MD, MPH [facility] 03/18/2020, 3:58 PM

## 2020-03-29 ENCOUNTER — Other Ambulatory Visit: Payer: Self-pay

## 2020-03-29 ENCOUNTER — Ambulatory Visit (INDEPENDENT_AMBULATORY_CARE_PROVIDER_SITE_OTHER)
Admission: RE | Admit: 2020-03-29 | Discharge: 2020-03-29 | Disposition: A | Discharge status: Home / Self Care | Payer: PPO | Source: Ambulatory Visit | Attending: Critical Care Medicine | Admitting: Critical Care Medicine

## 2020-03-29 DIAGNOSIS — I2510 Atherosclerotic heart disease of native coronary artery without angina pectoris: Secondary | ICD-10-CM | POA: Diagnosis not present

## 2020-03-29 DIAGNOSIS — R0600 Dyspnea, unspecified: Secondary | ICD-10-CM | POA: Diagnosis not present

## 2020-03-29 DIAGNOSIS — R059 Cough, unspecified: Secondary | ICD-10-CM | POA: Diagnosis not present

## 2020-03-29 DIAGNOSIS — R0602 Shortness of breath: Secondary | ICD-10-CM

## 2020-03-29 DIAGNOSIS — I700 Atherosclerosis of aorta: Secondary | ICD-10-CM | POA: Diagnosis not present

## 2020-03-29 NOTE — Progress Notes (Signed)
Please let Brittany [name] know that her CT scan does show some scarring from previous covid. It was not there on a CT scan in 2010. At this point there is nothing different to do unless it shows progression over time. Thanks!

## 2020-04-04 ENCOUNTER — Encounter: Payer: Self-pay | Admitting: Gastroenterology

## 2020-04-04 DIAGNOSIS — R197 Diarrhea, unspecified: Secondary | ICD-10-CM | POA: Diagnosis not present

## 2020-04-04 DIAGNOSIS — R140 Abdominal distension (gaseous): Secondary | ICD-10-CM | POA: Diagnosis not present

## 2020-04-04 NOTE — Progress Notes (Signed)
HR CT chest ordered by Dr. Carlis Abbott after visit on 03/04/20.  Nothing further needed.

## 2020-04-05 ENCOUNTER — Ambulatory Visit (INDEPENDENT_AMBULATORY_CARE_PROVIDER_SITE_OTHER): Payer: PPO

## 2020-04-05 ENCOUNTER — Telehealth: Payer: Self-pay | Admitting: Family Medicine

## 2020-04-05 ENCOUNTER — Other Ambulatory Visit: Payer: Self-pay

## 2020-04-05 DIAGNOSIS — Z599 Problem related to housing and economic circumstances, unspecified: Secondary | ICD-10-CM

## 2020-04-05 DIAGNOSIS — Z0000 Encounter for general adult medical examination without abnormal findings: Secondary | ICD-10-CM

## 2020-04-05 NOTE — Patient Instructions (Addendum)
Brittany [name] , Thank you for taking time to come for your Medicare Wellness Visit. I appreciate your ongoing commitment to your health goals. Please review the following plan we discussed and let me know if I can assist you in the future.   Screening recommendations/referrals: Colonoscopy: Done 04/09/18 Mammogram: Done 03/01/20 Bone Density: Done 02/13/19 Recommended yearly ophthalmology/optometry visit for glaucoma screening and checkup Recommended yearly dental visit for hygiene and checkup  Vaccinations: Influenza vaccine: Done 03/04/20 Pneumococcal vaccine: Up to date Tdap vaccine: Up to date Shingles vaccine: Completed 01/01/18 & 06/22/18  Covid-19: Completed 3/19 & 09/29/19  Advanced directives: Please bring a copy of your health care power of attorney and living will to the office at your convenience.  Conditions/risks identified: Lose weight   Next appointment: Follow up in one year for your annual wellness visit.   Preventive Care 40-64 Years, Female Preventive care refers to lifestyle choices and visits with your health care provider that can promote health and wellness. What does preventive care include?  A yearly physical exam. This is also called an annual well check.  Dental exams once or twice a year.  Routine eye exams. Ask your health care provider how often you should have your eyes checked.  Personal lifestyle choices, including:  Daily care of your teeth and gums.  Regular physical activity.  Eating a healthy diet.  Avoiding tobacco and drug use.  Limiting alcohol use.  Practicing safe sex.  Taking low-dose aspirin daily starting at age 74.  Taking vitamin and mineral supplements as recommended by your health care provider. What happens during an annual well check? The services and screenings done by your health care provider during your annual well check will depend on your age, overall health, lifestyle risk factors, and family history of  disease. Counseling  Your health care provider may ask you questions about your:  Alcohol use.  Tobacco use.  Drug use.  Emotional well-being.  Home and relationship well-being.  Sexual activity.  Eating habits.  Work and work Statistician.  Method of birth control.  Menstrual cycle.  Pregnancy history. Screening  You may have the following tests or measurements:  Height, weight, and BMI.  Blood pressure.  Lipid and cholesterol levels. These may be checked every 5 years, or more frequently if you are over 19 years old.  Skin check.  Lung cancer screening. You may have this screening every year starting at age 15 if you have a 30-pack-year history of smoking and currently smoke or have quit within the past 15 years.  Fecal occult blood test (FOBT) of the stool. You may have this test every year starting at age 66.  Flexible sigmoidoscopy or colonoscopy. You may have a sigmoidoscopy every 5 years or a colonoscopy every 10 years starting at age 28.  Hepatitis C blood test.  Hepatitis B blood test.  Sexually transmitted disease (STD) testing.  Diabetes screening. This is done by checking your blood sugar (glucose) after you have not eaten for a while (fasting). You may have this done every 1-3 years.  Mammogram. This may be done every 1-2 years. Talk to your health care provider about when you should start having regular mammograms. This may depend on whether you have a family history of breast cancer.  BRCA-related cancer screening. This may be done if you have a family history of breast, ovarian, tubal, or peritoneal cancers.  Pelvic exam and Pap test. This may be done every 3 years starting at age 25. Starting  at age 78, this may be done every 5 years if you have a Pap test in combination with an HPV test.  Bone density scan. This is done to screen for osteoporosis. You may have this scan if you are at high risk for osteoporosis. Discuss your test results,  treatment options, and if necessary, the need for more tests with your health care provider. Vaccines  Your health care provider may recommend certain vaccines, such as:  Influenza vaccine. This is recommended every year.  Tetanus, diphtheria, and acellular pertussis (Tdap, Td) vaccine. You may need a Td booster every 10 years.  Zoster vaccine. You may need this after age 31.  Pneumococcal 13-valent conjugate (PCV13) vaccine. You may need this if you have certain conditions and were not previously vaccinated.  Pneumococcal polysaccharide (PPSV23) vaccine. You may need one or two doses if you smoke cigarettes or if you have certain conditions. Talk to your health care provider about which screenings and vaccines you need and how often you need them. This information is not intended to replace advice given to you by your health care provider. Make sure you discuss any questions you have with your health care provider. Document Released: 07/01/2015 Document Revised: 02/22/2016 Document Reviewed: 04/05/2015 Elsevier Interactive Patient Education  2017 [place] Prevention in the Home Falls can cause injuries. They can happen to people of all ages. There are many things you can do to make your home safe and to help prevent falls. What can I do on the outside of my home?  Regularly fix the edges of walkways and driveways and fix any cracks.  Remove anything that might make you trip as you walk through a door, such as a raised step or threshold.  Trim any bushes or trees on the path to your home.  Use bright outdoor lighting.  Clear any walking paths of anything that might make someone trip, such as rocks or tools.  Regularly check to see if handrails are loose or broken. Make sure that both sides of any steps have handrails.  Any raised decks and porches should have guardrails on the edges.  Have any leaves, snow, or ice cleared regularly.  Use sand or salt on walking  paths during winter.  Clean up any spills in your garage right away. This includes oil or grease spills. What can I do in the bathroom?  Use night lights.  Install grab bars by the toilet and in the tub and shower. Do not use towel bars as grab bars.  Use non-skid mats or decals in the tub or shower.  If you need to sit down in the shower, use a plastic, non-slip stool.  Keep the floor dry. Clean up any water that spills on the floor as soon as it happens.  Remove soap buildup in the tub or shower regularly.  Attach bath mats securely with double-sided non-slip rug tape.  Do not have throw rugs and other things on the floor that can make you trip. What can I do in the bedroom?  Use night lights.  Make sure that you have a light by your bed that is easy to reach.  Do not use any sheets or blankets that are too big for your bed. They should not hang down onto the floor.  Have a firm chair that has side arms. You can use this for support while you get dressed.  Do not have throw rugs and other things on the floor  that can make you trip. What can I do in the kitchen?  Clean up any spills right away.  Avoid walking on wet floors.  Keep items that you use a lot in easy-to-reach places.  If you need to reach something above you, use a strong step stool that has a grab bar.  Keep electrical cords out of the way.  Do not use floor polish or wax that makes floors slippery. If you must use wax, use non-skid floor wax.  Do not have throw rugs and other things on the floor that can make you trip. What can I do with my stairs?  Do not leave any items on the stairs.  Make sure that there are handrails on both sides of the stairs and use them. Fix handrails that are broken or loose. Make sure that handrails are as long as the stairways.  Check any carpeting to make sure that it is firmly attached to the stairs. Fix any carpet that is loose or worn.  Avoid having throw rugs at  the top or bottom of the stairs. If you do have throw rugs, attach them to the floor with carpet tape.  Make sure that you have a light switch at the top of the stairs and the bottom of the stairs. If you do not have them, ask someone to add them for you. What else can I do to help prevent falls?  Wear shoes that:  Do not have high heels.  Have rubber bottoms.  Are comfortable and fit you well.  Are closed at the toe. Do not wear sandals.  If you use a stepladder:  Make sure that it is fully opened. Do not climb a closed stepladder.  Make sure that both sides of the stepladder are locked into place.  Ask someone to hold it for you, if possible.  Clearly mark and make sure that you can see:  Any grab bars or handrails.  First and last steps.  Where the edge of each step is.  Use tools that help you move around (mobility aids) if they are needed. These include:  Canes.  Walkers.  Scooters.  Crutches.  Turn on the lights when you go into a dark area. Replace any light bulbs as soon as they burn out.  Set up your furniture so you have a clear path. Avoid moving your furniture around.  If any of your floors are uneven, fix them.  If there are any pets around you, be aware of where they are.  Review your medicines with your doctor. Some medicines can make you feel dizzy. This can increase your chance of falling. Ask your doctor what other things that you can do to help prevent falls. This information is not intended to replace advice given to you by your health care provider. Make sure you discuss any questions you have with your health care provider. Document Released: 03/31/2009 Document Revised: 11/10/2015 Document Reviewed: 07/09/2014 Elsevier Interactive Patient Education  2017 [employer].

## 2020-04-05 NOTE — Progress Notes (Signed)
Virtual Visit via Telephone Note  I connected with  Lindsi Bayliss on 04/05/20 at  8:00 AM EDT by telephone and verified that I am speaking with the correct person using two identifiers.  Medicare Annual Wellness visit completed telephonically due to Covid-19 pandemic.   Persons participating in this call: This Health Coach and this patient.   Location: Patient: Home Provider: Office   I discussed the limitations, risks, security and privacy concerns of performing an evaluation and management service by telephone and the availability of in person appointments. The patient expressed understanding and agreed to proceed.  Unable to perform video visit due to video visit attempted and failed and/or patient does not have video capability.   Some vital signs may be absent or patient reported.   Willette Brace, LPN    Subjective:   Karenna Romanoff is a 64 y.o. female who presents for Medicare Annual (Subsequent) preventive examination.  Review of Systems     Cardiac Risk Factors include: advanced age (>90men, >75 women);obesity (BMI >30kg/m2);sedentary lifestyle;dyslipidemia     Objective:    Today's Vitals   04/05/20 0809  PainSc: 8    There is no height or weight on file to calculate BMI.  Advanced Directives 04/05/2020 01/22/2020 04/23/2019 04/21/2019 11/19/2017 11/19/2017 11/16/2016  Does Patient Have a Medical Advance Directive? Yes Yes - No (No Data) Yes Yes  Type of Advance Directive [place] in Chart? No - copy requested - - - - - -  Would patient like information on creating a medical advance directive? - - No - Patient declined - - - -    Current Medications (verified) Outpatient Encounter Medications as of 04/05/2020  Medication Sig  . albuterol (PROVENTIL) (2.5 MG/3ML) 0.083% nebulizer solution Take 3 mLs (2.5 mg total) by nebulization every 6 (six) hours as needed for wheezing or shortness  of breath.  . Albuterol Sulfate (PROAIR RESPICLICK) 240 (90 Base) MCG/ACT AEPB Inhale 2 puffs into the lungs every 6 (six) hours as needed.  . Biotin 1 MG CAPS Take by mouth.  . budesonide-formoterol (SYMBICORT) 160-4.5 MCG/ACT inhaler Inhale 2 puffs into the lungs 2 (two) times daily.  . busPIRone (BUSPAR) 15 MG tablet TAKE ONE TABLET BY MOUTH THREE TIMES A DAY  . cetirizine (ZYRTEC) 10 MG tablet Take 1 tablet (10 mg total) by mouth daily.  . Cholecalciferol (VITAMIN D-3 PO) Take 10,000 Units by mouth daily.   . colestipol (COLESTID) 1 g tablet Take 1 g by mouth as directed. Takes 2 tablets in the am and 1 tablet in the evening  . diazepam (VALIUM) 5 MG tablet TAKE ONE TABLET BY MOUTH EVERY 6 HOURS AS NEEDED FOR ANXIETY  . dicyclomine (BENTYL) 20 MG tablet Take 1 tablet (20 mg total) by mouth 4 (four) times daily as needed for spasms.  Marland Kitchen doxazosin (CARDURA) 4 MG tablet TAKE TWO TABLETS BY MOUTH EVERY NIGHT AT BEDTIME (Patient taking differently: Take 4 mg by mouth at bedtime. )  . famotidine (PEPCID) 20 MG tablet TAKE ONE TABLET BY MOUTH TWICE A DAY  . fluticasone (FLONASE) 50 MCG/ACT nasal spray SPRAY TWO SPRAYS IN EACH NOSTRIL TWICE DAILY  . mirtazapine (REMERON) 15 MG tablet TAKE ONE TABLET BY MOUTH EVERY NIGHT AT BEDTIME  . montelukast (SINGULAIR) 10 MG tablet TAKE ONE TABLET BY MOUTH AT BEDTIME  . Multiple Vitamin (MULTIVITAMIN WITH MINERALS) TABS tablet Take 1 tablet by  mouth daily.  . pantoprazole (PROTONIX) 40 MG tablet Take 1 tablet (40 mg total) by mouth 2 (two) times daily.  Marland Kitchen topiramate (TOPAMAX) 25 MG tablet 1 at night for 1 week, then 1 twice daily for 1 week, then 1 in the morning and 2 at night for 1 week then 2 twice daily   No facility-administered encounter medications on file as of 04/05/2020.    Allergies (verified) Aspirin, Codeine, Sulfamethoxazole-trimethoprim, Trileptal [oxcarbazepine], Trimethoprim, Ultram [tramadol], and Vioxx [rofecoxib]   History: Past Medical  History:  Diagnosis Date  . Abnormal EKG   . Anxiety   . Asthma   . Bipolar depression ([place]), Anxiety, PTSD, Panic disorder    -managed by [facility]  . Depression   . Foot pain   . GERD (gastroesophageal reflux disease)   . Heart murmur    as a child  . Insomnia   . Leg swelling   . Osteoporosis   . Tachycardia    Past Surgical History:  Procedure Laterality Date  . BREAST BIOPSY Right    biopsy of nipple  . CARPAL TUNNEL RELEASE Left   . CESAREAN SECTION     x 2  . CHOLECYSTECTOMY    . ENDOMETRIAL ABLATION    . KNEE SURGERY Right    reconstruction for patellar dislocation  . LAPAROSCOPY     x 2  . TUBAL LIGATION    . ULNAR NERVE REPAIR Left    Family History  Problem Relation Age of Onset  . Heart failure Mother   . Hypertension Mother   . Hyperlipidemia Mother   . Congestive Heart Failure Mother   . Other Father        killed  . COPD Paternal 37        a lot of aunts and uncle COPD or Emphysema  . Emphysema Paternal Uncle   . Colon polyps Maternal Aunt   . COPD Brother   . Heart disease Brother   . Pulmonary embolism Brother   . Breast cancer Neg Hx   . Colon cancer Neg Hx   . Esophageal cancer Neg Hx   . Pancreatic cancer Neg Hx   . Stomach cancer Neg Hx   . Liver disease Neg Hx    Social History   Socioeconomic History  . Marital status: Single    Spouse name: Not on file  . Number of children: 3  . Years of education: Not on file  . Highest education level: Not on file  Occupational History  . Occupation: disabled  Tobacco Use  . Smoking status: Former Smoker    Packs/day: 20.00    Years: 1.00    Pack years: 20.00    Quit date: 06/19/1999    Years since quitting: 20.8  . Smokeless tobacco: Never Used  . Tobacco comment: 20 year estimate but probably less   Vaping Use  . Vaping Use: Never used  Substance and Sexual Activity  . Alcohol use: Yes    Alcohol/week: 0.0 standard drinks    Comment: Occass.   . Drug use: No  .  Sexual activity: Never  Other Topics Concern  . Not on file  Social History Narrative   Work or School: Disabled seconndary to psychiatric conditions      Home Situation: lives alone with 2 cats and one dog      Spiritual Beliefs: Christian      Lifestyle: no regular exercise, diet  Not great - wants to embark on healthier lifestyle  Social Determinants of Health   Financial Resource Strain: Medium Risk  . Difficulty of Paying Living Expenses: Somewhat hard  Food Insecurity: No Food Insecurity  . Worried About Charity fundraiser in the Last Year: Never true  . Ran Out of Food in the Last Year: Never true  Transportation Needs: No Transportation Needs  . Lack of Transportation (Medical): No  . Lack of Transportation (Non-Medical): No  Physical Activity: Inactive  . Days of Exercise per Week: 0 days  . Minutes of Exercise per Session: 0 min  Stress: Stress Concern Present  . Feeling of Stress : To some extent  Social Connections: Moderately Isolated  . Frequency of Communication with Friends and Family: Twice a week  . Frequency of Social Gatherings with Friends and Family: Once a week  . Attends Religious Services: 1 to 4 times per year  . Active Member of Clubs or Organizations: No  . Attends Archivist Meetings: Never  . Marital Status: Never married    Tobacco Counseling Counseling given: Not Answered Comment: 20 year estimate but probably less    Clinical Intake:  Pre-visit preparation completed: Yes  Pain : 0-10 (stomach) Pain Score: 8  Pain Type: Chronic pain Pain Location: Other (Comment) (stomach) Pain Descriptors / Indicators: Burning Pain Onset: More than a month ago Pain Frequency: Intermittent     BMI - recorded: 34.58 Nutritional Status: BMI > 30  Obese Nutritional Risks: Nausea/ vomitting/ diarrhea Diabetes: No  How often do you need to have someone help you when you read instructions, pamphlets, or other written materials from your  doctor or pharmacy?: 1 - Never  Diabetic?No  Interpreter Needed?: No  Information entered by :: Charlott Rakes, LPN   Activities of Daily Living In your present state of health, do you have any difficulty performing the following activities: 04/05/2020 04/23/2019  Hearing? N N  Vision? N N  Difficulty concentrating or making decisions? Y N  Comment foggy from covid -  Walking or climbing stairs? N N  Dressing or bathing? N N  Doing errands, shopping? N N  Preparing Food and eating ? N -  Using the Toilet? N -  In the past six months, have you accidently leaked urine? Y -  Comment wears panty liners -  Do you have problems with loss of bowel control? Y -  Comment related to stomach issues -  Managing your Medications? N -  Managing your Finances? N -  Housekeeping or managing your Housekeeping? N -  Some recent data might be hidden    Patient Care Team: Caren Macadam, MD as PCP - General (Family Medicine) Josue Hector, MD as PCP - Cardiology (Cardiology)  Indicate any recent Medical Services you may have received from other than [facility] providers in the past year (date may be approximate).     Assessment:   This is a routine wellness examination for Tekila.  Hearing/Vision screen  Hearing Screening   125Hz  250Hz  500Hz  1000Hz  2000Hz  3000Hz  4000Hz  6000Hz  8000Hz   Right ear:           Left ear:           Comments: Pt denies any difficulty but will have selective hearing   Vision Screening Comments: Pt follows up with [facility] for eye exams  Dietary issues and exercise activities discussed: Current Exercise Habits: The patient does not participate in regular exercise at present, Exercise limited by: Other - see comments (stomach issues cause her  to feel like she is having a spell)  Goals    . Patient Stated     Restart keto genic diet     . Patient Stated     Lose weight     . Weight (lb) < 150 lb (68 kg)     Will start walking!  Can walk in 55 plus  community Would walk at 6:30 in am and pm  Benefits of doing this weight loss, strengthening lung and heart Energy; building up metabolism  Benefit of not doing it? Losing muscle tone and weight gain More sagging skin   Motivation 1-10 it is a 4 Why isn't it a 1 or 2; because you will get up and do what you can in your apt. What is the comparison of walking vs not walking? Picture the outcome of more inelasticity;  Can be better;   Will take inhaler prior to walking; start off slow as it has hills  Keep a log daily and see how you feel at certain temps or humidity         Depression Screen PHQ 2/9 Scores 04/05/2020 01/22/2020 11/27/2018 11/19/2017 11/16/2016 09/13/2014  PHQ - 2 Score 2 0 6 0 0 3  PHQ- 9 Score 4 - 22 - - 11    Fall Risk Fall Risk  04/05/2020 01/22/2020 11/27/2018 11/19/2017 11/16/2016  Falls in the past year? 0 0 0 Yes Yes  Comment - - - putting an antenna up  -  Number falls in past yr: 0 - 0 1 1  Injury with Fall? 0 - 0 No -  Risk for fall due to : Impaired balance/gait;Impaired vision - - - -  Follow up Falls prevention discussed - - Education provided Education provided  Comment - - - - tripped     Any stairs in or around the home? No  If so, are there any without handrails? No  Home free of loose throw rugs in walkways, pet beds, electrical cords, etc? Yes  Adequate lighting in your home to reduce risk of falls? Yes   ASSISTIVE DEVICES UTILIZED TO PREVENT FALLS:  Life alert? No  Use of a cane, walker or w/c? No  Grab bars in the bathroom? Yes  Shower chair or bench in shower? No  Elevated toilet seat or a handicapped toilet? Yes   TIMED UP AND GO:  Was the test performed? No .      Cognitive Function: MMSE - Mini Mental State Exam 11/19/2017 11/16/2016  Not completed: (No Data) (No Data)     6CIT Screen 04/05/2020  What Year? 0 points  What month? 0 points  Count back from 20 0 points  Months in reverse 0 points  Repeat phrase 0 points     Immunizations Immunization History  Administered Date(s) Administered  . Influenza Split 04/26/2011  . Influenza Whole 03/18/2009, 03/18/2010, 03/18/2016  . Influenza,inj,Quad PF,6+ Mos 04/19/2014, 02/28/2017, 03/06/2018, 02/13/2019, 03/04/2020  . PFIZER SARS-COV-2 Vaccination 09/04/2019, 09/29/2019  . Pneumococcal Conjugate-13 03/10/2015  . Pneumococcal Polysaccharide-23 06/18/2005  . Tdap 12/04/2016  . Zoster Recombinat (Shingrix) 01/01/2018, 06/22/2018    TDAP status: Up to date Flu Vaccine status: Up to date Pneumococcal vaccine status: Up to date Covid-19 vaccine status: Completed vaccines  Qualifies for Shingles Vaccine? Yes   Zostavax completed Yes   Shingrix Completed?: Yes  Screening Tests Health Maintenance  Topic Date Due  . DEXA SCAN  02/12/2021  . PAP SMEAR-Modifier  11/27/2021  . MAMMOGRAM  03/01/2022  .  TETANUS/TDAP  12/05/2026  . COLONOSCOPY  04/09/2028  . INFLUENZA VACCINE  Completed  . COVID-19 Vaccine  Completed  . Hepatitis C Screening  Completed  . HIV Screening  Completed    Health Maintenance  There are no preventive care reminders to display for this patient.  Colorectal cancer screening: Completed 04/09/18. Repeat every 10 years Mammogram status: Completed 03/01/20. Repeat every year Bone Density status: Completed 02/13/19. Results reflect: Bone density results: OSTEOPENIA. Repeat every 2 years.   Additional Screening:  Hepatitis C Screening: Completed per pt report On 06/18/10   Vision Screening: Recommended annual ophthalmology exams for early detection of glaucoma and other disorders of the eye. Is the patient up to date with their annual eye exam?  No  Who is the provider or what is the name of the office in which the patient attends annual eye exams? [facility]   Dental Screening: Recommended annual dental exams for proper oral hygiene  Community Resource Referral / Chronic Care Management: CRR required this visit?  Yes    CCM required this visit?  No      Plan:     I have personally reviewed and noted the following in the patient's chart:   . Medical and social history . Use of alcohol, tobacco or illicit drugs  . Current medications and supplements . Functional ability and status . Nutritional status . Physical activity . Advanced directives . List of other physicians . Hospitalizations, surgeries, and ER visits in previous 12 months . Vitals . Screenings to include cognitive, depression, and falls . Referrals and appointments  In addition, I have reviewed and discussed with patient certain preventive protocols, quality metrics, and best practice recommendations. A written personalized care plan for preventive services as well as general preventive health recommendations were provided to patient.     Willette Brace, LPN   96/29/[id number]   Nurse Notes: Submitted assistance for help with food and medication cost. Pt states she struggling financially since covid.

## 2020-04-05 NOTE — Telephone Encounter (Signed)
   SF 04/05/2020   Name: Brittany [name]   MRN: [id number]   DOB: 1955-09-21   AGE: 64 y.o.   GENDER: female   PCP Caren Macadam, MD.   Saddie Benders 04/05/20 Spoke with Ms. Kempton today regarding food insecurities. Patient stated that she is still in need of food. She stated that she receives about $1425 in [employer] each month, but by the time she pays all of her bills she does not have enough money left to buy food. Asked patient is she was interested in being placed on the wait-list for Meals on Wheels. Patient stated that she would like to. Care Guide will call [employer] [facility] [place] to add patient to Meals on Wheels wait-list.  Also informed patient about a program through [facility] [employer] [facility] called the [employer] [facility] [employer] [facility] that can possibly deliver food to her. Patient was interested in the program and completed the referral form. Care Guide e-mailed form to Hart Carwin at [facility] [employer] to be processed. Also e-mailed patient at [email or web address] a list of food pantries in the [facility]. Care Guide will give patient a follow up call within the week.    [place], Care Management Phone: [phone] Email: [email or web address]

## 2020-04-06 ENCOUNTER — Encounter: Payer: Self-pay | Admitting: Family Medicine

## 2020-04-06 ENCOUNTER — Ambulatory Visit (INDEPENDENT_AMBULATORY_CARE_PROVIDER_SITE_OTHER): Payer: PPO | Admitting: Family Medicine

## 2020-04-06 VITALS — BP 100/76 | HR 84 | Temp 98.0°F | Ht 61.5 in | Wt 183.6 lb

## 2020-04-06 DIAGNOSIS — R42 Dizziness and giddiness: Secondary | ICD-10-CM | POA: Diagnosis not present

## 2020-04-06 DIAGNOSIS — I2510 Atherosclerotic heart disease of native coronary artery without angina pectoris: Secondary | ICD-10-CM | POA: Diagnosis not present

## 2020-04-06 DIAGNOSIS — Z0000 Encounter for general adult medical examination without abnormal findings: Secondary | ICD-10-CM

## 2020-04-06 DIAGNOSIS — F4312 Post-traumatic stress disorder, chronic: Secondary | ICD-10-CM | POA: Diagnosis not present

## 2020-04-06 DIAGNOSIS — Z13220 Encounter for screening for lipoid disorders: Secondary | ICD-10-CM | POA: Diagnosis not present

## 2020-04-06 DIAGNOSIS — K219 Gastro-esophageal reflux disease without esophagitis: Secondary | ICD-10-CM | POA: Diagnosis not present

## 2020-04-06 DIAGNOSIS — R5383 Other fatigue: Secondary | ICD-10-CM | POA: Diagnosis not present

## 2020-04-06 DIAGNOSIS — Z131 Encounter for screening for diabetes mellitus: Secondary | ICD-10-CM | POA: Diagnosis not present

## 2020-04-06 DIAGNOSIS — J8410 Pulmonary fibrosis, unspecified: Secondary | ICD-10-CM

## 2020-04-06 NOTE — Progress Notes (Signed)
Brittany [name] DOB: 1956/01/21 Encounter date: 04/06/2020  This is a 64 y.o. female who presents for complete physical   History of present illness/Additional concerns: Has place on leg and back. Place on leg seems to be getting darker. Place on back has popped up sooner. Looks like collection of vessels.   Last pap was 11/2018 and was normal with HPV negative. Last mammogram was 03/02/20 and was normal.   Last DEXA 02/14/2019 osteopenia. Plan to recheck in 2-3 years.  Asthma/GERD: following with new pulmonologist next month - difficult bout with COVID last year; was depdt on oxygen for some time. Had recent CT scan which showed some pulmonary fibrosis. She is learning things that she can and can't have. Stomach continues to have hot coal/burning sensation. Last EGD was 03/2018. Worries she has developed an ulcer as pain is worse when stress is worse.   In last year and a half has lost two pets, she is struggling with coping due to chronic pain.   Stomach is bothering her really badly. Has been drinking more water. Recently saw Dr. Tarri Glenn. Has noticed improvement with protonix. She just had another breath test.  Having episodes of feeling light headed, pre syncopal. Has stopped exercising because she doesn't feel well when she is exercising. Pulse ox is holding even with exercise - staying 95 or higher.  Just wants to feel better. Has grandson coming.   Bipolar/anxiety/depression: following with Dr Clovis Pu, restoration place; on disability. Taking lithium, doxazosin,buspar,xanax. Been a really hard year for her. 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 She is very afraid of ending back up in hospital.    Past Medical History:  Diagnosis Date   Abnormal EKG    Anxiety    Asthma    Bipolar depression ([place]), Anxiety, PTSD, Panic disorder    -managed by [facility]   Depression    Foot pain    GERD (gastroesophageal reflux disease)    Heart murmur    as a  child   Insomnia    Leg swelling    Osteoporosis    Tachycardia    Past Surgical History:  Procedure Laterality Date   BREAST BIOPSY Right    biopsy of nipple   CARPAL TUNNEL RELEASE Left    CESAREAN SECTION     x 2   CHOLECYSTECTOMY     ENDOMETRIAL ABLATION     KNEE SURGERY Right    reconstruction for patellar dislocation   LAPAROSCOPY     x 2   TUBAL LIGATION     ULNAR NERVE REPAIR Left    Allergies  Allergen Reactions   Aspirin Other (See Comments)    discomfort   Codeine Itching   Sulfamethoxazole-Trimethoprim Other (See Comments)    Unknown reaction per pt   Trileptal [Oxcarbazepine]     itching   Trimethoprim Other (See Comments)    Unknown, per pt   Ultram [Tramadol] Itching   Vioxx [Rofecoxib]     Unknown per pt   Current Meds  Medication Sig   albuterol (PROVENTIL) (2.5 MG/3ML) 0.083% nebulizer solution Take 3 mLs (2.5 mg total) by nebulization every 6 (six) hours as needed for wheezing or shortness of breath.   Albuterol Sulfate (PROAIR RESPICLICK) 720 (90 Base) MCG/ACT AEPB Inhale 2 puffs into the lungs every 6 (six) hours as needed.   Biotin 1 MG CAPS Take by mouth.   budesonide-formoterol (SYMBICORT) 160-4.5 MCG/ACT inhaler Inhale 2 puffs into the lungs 2 (two) times daily.  busPIRone (BUSPAR) 15 MG tablet TAKE ONE TABLET BY MOUTH THREE TIMES A DAY   cetirizine (ZYRTEC) 10 MG tablet Take 1 tablet (10 mg total) by mouth daily.   Cholecalciferol (VITAMIN D-3 PO) Take 10,000 Units by mouth daily.    colestipol (COLESTID) 1 g tablet Take 1 g by mouth as directed. Takes 2 tablets in the am and 1 tablet in the evening   diazepam (VALIUM) 5 MG tablet TAKE ONE TABLET BY MOUTH EVERY 6 HOURS AS NEEDED FOR ANXIETY   dicyclomine (BENTYL) 20 MG tablet Take 1 tablet (20 mg total) by mouth 4 (four) times daily as needed for spasms.   doxazosin (CARDURA) 4 MG tablet TAKE TWO TABLETS BY MOUTH EVERY NIGHT AT BEDTIME (Patient taking  differently: Take 4 mg by mouth at bedtime. )   famotidine (PEPCID) 20 MG tablet TAKE ONE TABLET BY MOUTH TWICE A DAY   fluticasone (FLONASE) 50 MCG/ACT nasal spray SPRAY TWO SPRAYS IN EACH NOSTRIL TWICE DAILY   mirtazapine (REMERON) 15 MG tablet TAKE ONE TABLET BY MOUTH EVERY NIGHT AT BEDTIME   montelukast (SINGULAIR) 10 MG tablet TAKE ONE TABLET BY MOUTH AT BEDTIME   Multiple Vitamin (MULTIVITAMIN WITH MINERALS) TABS tablet Take 1 tablet by mouth daily.   pantoprazole (PROTONIX) 40 MG tablet Take 1 tablet (40 mg total) by mouth 2 (two) times daily.   topiramate (TOPAMAX) 25 MG tablet 1 at night for 1 week, then 1 twice daily for 1 week, then 1 in the morning and 2 at night for 1 week then 2 twice daily   Social History   Tobacco Use   Smoking status: Former Smoker    Packs/day: 20.00    Years: 1.00    Pack years: 20.00    Quit date: 06/19/1999    Years since quitting: 20.8   Smokeless tobacco: Never Used   Tobacco comment: 20 year estimate but probably less   Substance Use Topics   Alcohol use: Yes    Alcohol/week: 0.0 standard drinks    Comment: Occass.    Family History  Problem Relation Age of Onset   Heart failure Mother    Hypertension Mother    Hyperlipidemia Mother    Congestive Heart Failure Mother    Other Father        killed   COPD Paternal 24        a lot of aunts and uncle COPD or Emphysema   Emphysema Paternal Uncle    Colon polyps Maternal Aunt    COPD Brother    Heart disease Brother    Pulmonary embolism Brother    Breast cancer Neg Hx    Colon cancer Neg Hx    Esophageal cancer Neg Hx    Pancreatic cancer Neg Hx    Stomach cancer Neg Hx    Liver disease Neg Hx      Review of Systems  Constitutional: Positive for fatigue. Negative for activity change, appetite change, chills, fever and unexpected weight change.  HENT: Negative for congestion, ear pain, hearing loss, sinus pressure, sinus pain, sore throat and trouble  swallowing.   Eyes: Negative for pain and visual disturbance.  Respiratory: Positive for shortness of breath. Negative for cough, chest tightness and wheezing.   Cardiovascular: Negative for chest pain, palpitations and leg swelling.  Gastrointestinal: Positive for abdominal pain. Negative for blood in stool, constipation, diarrhea, nausea and vomiting.  Genitourinary: Negative for difficulty urinating and menstrual problem.  Musculoskeletal: Negative for arthralgias and back pain.  Skin: Negative for rash.  Neurological: Positive for dizziness and light-headedness. Negative for weakness, numbness and headaches.  Hematological: Negative for adenopathy. Does not bruise/bleed easily.  Psychiatric/Behavioral: Negative for sleep disturbance and suicidal ideas. The patient is not nervous/anxious.     CBC:  Lab Results  Component Value Date   WBC 4.7 04/06/2020   HGB 15.3 04/06/2020   HCT 45.7 (H) 04/06/2020   MCH 31.7 04/06/2020   MCHC 33.5 04/06/2020   RDW 12.8 04/06/2020   PLT 273 04/06/2020   MPV 8.9 04/06/2020   CMP: Lab Results  Component Value Date   NA 142 04/06/2020   K 4.5 04/06/2020   CL 109 04/06/2020   CO2 24 04/06/2020   ANIONGAP 13 05/01/2019   GLUCOSE 80 04/06/2020   BUN 14 04/06/2020   CREATININE 0.94 04/06/2020   GFRAA >60 05/01/2019   CALCIUM 9.6 04/06/2020   PROT 6.3 04/06/2020   BILITOT 0.5 04/06/2020   ALKPHOS 50 05/12/2019   ALT 9 04/06/2020   AST 12 04/06/2020   LIPID: Lab Results  Component Value Date   CHOL 243 (H) 04/06/2020   TRIG 111 04/06/2020   HDL 53 04/06/2020   LDLCALC 166 (H) 04/06/2020    Objective:  BP 100/76 (BP Location: Left Arm, Patient Position: Sitting, Cuff Size: Large)    Pulse 84    Temp 98 F (36.7 C) (Oral)    Ht 5' 1.5" (1.562 m)    Wt 183 lb 9.6 oz (83.3 kg)    SpO2 98%    BMI 34.13 kg/m   Weight: 183 lb 9.6 oz (83.3 kg)   BP Readings from Last 3 Encounters:  04/06/20 100/76  03/04/20 102/78  02/11/20 106/73    Wt Readings from Last 3 Encounters:  04/06/20 183 lb 9.6 oz (83.3 kg)  03/18/20 186 lb (84.4 kg)  03/04/20 188 lb 3.2 oz (85.4 kg)    Physical Exam Constitutional:      General: She is not in acute distress.    Appearance: She is well-developed.  HENT:     Head: Normocephalic and atraumatic.     Right Ear: External ear normal.     Left Ear: External ear normal.     Mouth/Throat:     Pharynx: No oropharyngeal exudate.  Eyes:     Conjunctiva/sclera: Conjunctivae normal.     Pupils: Pupils are equal, round, and reactive to light.  Neck:     Thyroid: No thyromegaly.  Cardiovascular:     Rate and Rhythm: Normal rate and regular rhythm.     Heart sounds: Normal heart sounds. No murmur heard.  No friction rub. No gallop.   Pulmonary:     Effort: Pulmonary effort is normal.     Breath sounds: Normal breath sounds. Decreased air movement: slightly.  Abdominal:     General: Bowel sounds are normal. There is no distension.     Palpations: Abdomen is soft. There is no mass.     Tenderness: There is no abdominal tenderness. There is no guarding.     Hernia: No hernia is present.  Musculoskeletal:        General: No tenderness or deformity. Normal range of motion.     Cervical back: Normal range of motion and neck supple.  Lymphadenopathy:     Cervical: No cervical adenopathy.  Skin:    General: Skin is warm and dry.     Findings: No rash.  Neurological:     Mental Status: She is alert and  oriented to person, place, and time.     Deep Tendon Reflexes: Reflexes normal.     Reflex Scores:      Tricep reflexes are 2+ on the right side and 2+ on the left side.      Bicep reflexes are 2+ on the right side and 2+ on the left side.      Brachioradialis reflexes are 2+ on the right side and 2+ on the left side.      Patellar reflexes are 2+ on the right side and 2+ on the left side. Psychiatric:        Speech: Speech normal.        Behavior: Behavior normal.        Thought Content:  Thought content normal.     Assessment/Plan: There are no preventive care reminders to display for this patient. Health Maintenance reviewed.  1. Preventative health care She is limited with exercise currently due to feeling lightheaded/short of breath.   2. Light headed Only coming on with exercise; we will repeat bloodwork, also going to get carotid doppler; although I think this is low on differential for etiology. I have reached out through mychart to cardio and pulm to make them aware of this persistent concern.  - CBC with Differential/Platelet; Future - Comprehensive metabolic panel; Future - TSH; Future - TSH - Comprehensive metabolic panel - CBC with Differential/Platelet  3. Coronary artery disease involving native heart without angina pectoris, unspecified vessel or lesion type - US Carotid Bilateral; Future  4. Pulmonary fibrosis [facility]) Following w pulmonology; has visit in next 2 weeks.   5. Gastroesophageal reflux disease, unspecified whether esophagitis present Continue with protonix, pepcid. We discussed that making sure GERD is controlled is important since it can affect her breathing.   6. Chronic post-traumatic stress disorder (PTSD) Follows with psychiatry regularly.   7. Lipid screening - Lipid panel; Future - Lipid panel  8. Screening for diabetes mellitus - Hemoglobin A1c; Future - Hemoglobin A1c  9. Fatigue, unspecified type - Vitamin B12; Future - VITAMIN D 25 Hydroxy (Vit-D Deficiency, Fractures); Future - Iron, TIBC and Ferritin Panel; Future - Iron, TIBC and Ferritin Panel - VITAMIN D 25 Hydroxy (Vit-D Deficiency, Fractures) - Vitamin B12  Return for pending imaging.  Micheline Rough, MD

## 2020-04-07 LAB — CBC WITH DIFFERENTIAL/PLATELET
Absolute Monocytes: 334 cells/uL (ref 200–950)
Basophils Absolute: 52 cells/uL (ref 0–200)
Basophils Relative: 1.1 %
Eosinophils Absolute: 42 cells/uL (ref 15–500)
Eosinophils Relative: 0.9 %
HCT: 45.7 % — ABNORMAL HIGH (ref 35.0–45.0)
Hemoglobin: 15.3 g/dL (ref 11.7–15.5)
Lymphs Abs: 855 cells/uL (ref 850–3900)
MCH: 31.7 pg (ref 27.0–33.0)
MCHC: 33.5 g/dL (ref 32.0–36.0)
MCV: 94.6 fL (ref 80.0–100.0)
MPV: 8.9 fL (ref 7.5–12.5)
Monocytes Relative: 7.1 %
Neutro Abs: 3417 cells/uL (ref 1500–7800)
Neutrophils Relative %: 72.7 %
Platelets: 273 10*3/uL (ref 140–400)
RBC: 4.83 10*6/uL (ref 3.80–5.10)
RDW: 12.8 % (ref 11.0–15.0)
Total Lymphocyte: 18.2 %
WBC: 4.7 10*3/uL (ref 3.8–10.8)

## 2020-04-07 LAB — COMPREHENSIVE METABOLIC PANEL
AG Ratio: 2.5 (calc) (ref 1.0–2.5)
ALT: 9 U/L (ref 6–29)
AST: 12 U/L (ref 10–35)
Albumin: 4.5 g/dL (ref 3.6–5.1)
Alkaline phosphatase (APISO): 66 U/L (ref 37–153)
BUN: 14 mg/dL (ref 7–25)
CO2: 24 mmol/L (ref 20–32)
Calcium: 9.6 mg/dL (ref 8.6–10.4)
Chloride: 109 mmol/L (ref 98–110)
Creat: 0.94 mg/dL (ref 0.50–0.99)
Globulin: 1.8 g/dL (calc) — ABNORMAL LOW (ref 1.9–3.7)
Glucose, Bld: 80 mg/dL (ref 65–99)
Potassium: 4.5 mmol/L (ref 3.5–5.3)
Sodium: 142 mmol/L (ref 135–146)
Total Bilirubin: 0.5 mg/dL (ref 0.2–1.2)
Total Protein: 6.3 g/dL (ref 6.1–8.1)

## 2020-04-07 LAB — LIPID PANEL
Cholesterol: 243 mg/dL — ABNORMAL HIGH (ref <200)
HDL: 53 mg/dL (ref >=50)
LDL Cholesterol (Calc): 166 mg/dL (calc) — ABNORMAL HIGH
Non-HDL Cholesterol (Calc): 190 mg/dL (calc) — ABNORMAL HIGH (ref <130)
Total CHOL/HDL Ratio: 4.6 (calc) (ref <5.0)
Triglycerides: 111 mg/dL (ref <150)

## 2020-04-07 LAB — IRON,TIBC AND FERRITIN PANEL
%SAT: 21 % (calc) (ref 16–45)
Ferritin: 36 ng/mL (ref 16–288)
Iron: 78 ug/dL (ref 45–160)
TIBC: 366 mcg/dL (calc) (ref 250–450)

## 2020-04-07 LAB — TSH: TSH: 1.52 mIU/L (ref 0.40–4.50)

## 2020-04-07 LAB — HEMOGLOBIN A1C
Hgb A1c MFr Bld: 4.7 % of total Hgb (ref <5.7)
Mean Plasma Glucose: 88 (calc)
eAG (mmol/L): 4.9 (calc)

## 2020-04-07 LAB — VITAMIN D 25 HYDROXY (VIT D DEFICIENCY, FRACTURES): Vit D, 25-Hydroxy: 56 ng/mL (ref 30–100)

## 2020-04-07 LAB — VITAMIN B12: Vitamin B-12: 319 pg/mL (ref 200–1100)

## 2020-04-08 NOTE — Telephone Encounter (Signed)
° °  [facility] 04/08/2020    Name: Brittany [name]    MRN: [id number]    DOB: 01/26/1956    AGE: 64 y.o.    GENDER: female    PCP Caren Macadam, MD.   Called [employer] on [place] regarding to add [name] to the wait-list for Meals on Wheels. Spoke with Delfino Lovett he stated that unfortunately Ms. Touch does not qualify for Meals on Wheels right now because she is not homebound. But if her circumstances were to change then she can give their office a call. Spoke with [name] to let her know. She stated understanding. Asked patient if she received e-mail that was sent to her with a list of food pantries in the area. Patient stated that she did and that she will give them a call to see what they offer. Asked patient if she has any additional needs at this time. Patient stated she does not.   Closing referral pending any other needs of patient.     [place], Care Management Phone: [phone] Email: [email or web address]

## 2020-04-15 ENCOUNTER — Other Ambulatory Visit: Payer: Self-pay | Admitting: Psychiatry

## 2020-04-15 DIAGNOSIS — F4310 Post-traumatic stress disorder, unspecified: Secondary | ICD-10-CM

## 2020-04-15 DIAGNOSIS — F4001 Agoraphobia with panic disorder: Secondary | ICD-10-CM

## 2020-04-15 DIAGNOSIS — F515 Nightmare disorder: Secondary | ICD-10-CM

## 2020-04-18 NOTE — Telephone Encounter (Signed)
Last apt 01/2020. Update Rx?

## 2020-04-19 ENCOUNTER — Telehealth: Payer: Self-pay | Admitting: Psychiatry

## 2020-04-19 ENCOUNTER — Telehealth: Payer: Self-pay | Admitting: Critical Care Medicine

## 2020-04-19 NOTE — Telephone Encounter (Signed)
Spoke with pt. She is requesting samples of Symbicort. Advised her that we no longer receive samples because it is now generic. Pt became upset and yelled, "Well what am I supposed to do the next 2 months? I am in donut hole." Stated that even the generic is $82/month.  Dr. Carlis Abbott - please advise. Thanks.

## 2020-04-19 NOTE — Telephone Encounter (Signed)
Pt called asking for clarification of her topiramate. The dosage and the directions are different . It looks like he increased her dosage but didn't  tell her. Please call her at [phone]

## 2020-04-19 NOTE — Telephone Encounter (Signed)
Thank you.  Topirmate was started at 25 mg daily and increased to 100 mg daily.  She should now take topiramate 100 mg tablet, 1 daily.  Please let her know.

## 2020-04-19 NOTE — Telephone Encounter (Signed)
Rx sent in for her Topamax but I'm assuming you don't want her doing all those instructions. Please verify

## 2020-04-19 NOTE — Telephone Encounter (Signed)
Spoke with the pt and notified of response per Dr Carlis Abbott. She already has pt assistance forms for Symbicort and will bring to upcoming ov with JD for him to sign. She wants to continue to use her albuterol nebs if needed. Will hold off on Budesonide for now and she will discuss more with Dr Erin Fulling at her upcoming visit.

## 2020-04-19 NOTE — Telephone Encounter (Signed)
Unfortunately this is always a hard situation and I am not sure we are going to have a good solution. She can apply for patient assistance for Symbicort, Dulera, Advair, or Breo. We can try to prescribe nebulized budesonide if this is covered, and she can use nebulized meds to get her through the next few months.   [name], DO 04/19/20 5:03 PM [facility]

## 2020-04-20 ENCOUNTER — Other Ambulatory Visit: Payer: Self-pay

## 2020-04-20 DIAGNOSIS — F515 Nightmare disorder: Secondary | ICD-10-CM

## 2020-04-20 DIAGNOSIS — F4001 Agoraphobia with panic disorder: Secondary | ICD-10-CM

## 2020-04-20 DIAGNOSIS — F4312 Post-traumatic stress disorder, chronic: Secondary | ICD-10-CM

## 2020-04-20 DIAGNOSIS — F4310 Post-traumatic stress disorder, unspecified: Secondary | ICD-10-CM

## 2020-04-20 MED ORDER — TOPIRAMATE 100 MG PO TABS: 100.0000 mg | ORAL_TABLET | Freq: Every day | ORAL | 1 refills | Status: DC

## 2020-04-20 NOTE — Telephone Encounter (Signed)
Patient notified of instructions.

## 2020-04-20 NOTE — Progress Notes (Signed)
Patient's prescription submitted with incorrect instructions. Patient now taking Topamax 100 mg 1 daily. Patient notified and her pharmacy updated as well.

## 2020-04-25 ENCOUNTER — Other Ambulatory Visit: Payer: Self-pay

## 2020-04-25 ENCOUNTER — Ambulatory Visit
Admission: RE | Admit: 2020-04-25 | Discharge: 2020-04-25 | Disposition: A | Discharge status: Home / Self Care | Payer: PPO | Source: Ambulatory Visit | Attending: Family Medicine | Admitting: Family Medicine

## 2020-04-25 ENCOUNTER — Ambulatory Visit: Payer: PPO | Admitting: Pulmonary Disease

## 2020-04-25 ENCOUNTER — Encounter: Payer: Self-pay | Admitting: Pulmonary Disease

## 2020-04-25 VITALS — BP 110/76 | HR 77 | Temp 98.2°F | Ht 61.5 in | Wt 187.2 lb

## 2020-04-25 DIAGNOSIS — J4540 Moderate persistent asthma, uncomplicated: Secondary | ICD-10-CM

## 2020-04-25 DIAGNOSIS — R0683 Snoring: Secondary | ICD-10-CM | POA: Diagnosis not present

## 2020-04-25 DIAGNOSIS — R0609 Other forms of dyspnea: Secondary | ICD-10-CM

## 2020-04-25 DIAGNOSIS — I6523 Occlusion and stenosis of bilateral carotid arteries: Secondary | ICD-10-CM | POA: Diagnosis not present

## 2020-04-25 DIAGNOSIS — I2510 Atherosclerotic heart disease of native coronary artery without angina pectoris: Secondary | ICD-10-CM

## 2020-04-25 NOTE — Progress Notes (Signed)
Synopsis: Referred in 2018 for asthma by Caren Macadam, MD.  Previously patient of Dr. Melvyn Novas and Dr. Gwenette Greet.   Subjective:   PATIENT ID: Brittany [name] GENDER: female DOB: [birth year], MRN: [id number]  Chief Complaint  Patient presents with  . Follow-up    SOB with activity.       Brittany [name] is a 64 year old woman with a history of asthma who presents for follow-up.  She recently had an office visit on 03/04/20 with Dr. Loletta Specter (note reviewed).  At that time her asthma was well controlled. She returns to clinic earlier than scheduled for shortness of breath with activity. She complains of occasional wheezing and has nocturnal awakenings due to shortness of breath and wheezing once every couple of months. She complains of cough with throat congestion. She was having post-nasal drip before which has been treated effectively with fluticasone and saline spray. She does snore at night and does not feel rested in the morning. She has afternoon sleepiness and fatigue. She has gained 30-40lbs over the past year.     She is on Symbicort twice daily, albuterol only as needed. Humidity seems to make her symptoms worse, so she prefers to exercise on a treadmill.  She had Covid in November 2020 with persistent symptoms.  She had follow-up chest x-ray abnormalities while in August 2021.  She has not yet had a CT scan to evaluate these abnormalities.  She was recently evaluated by cardiology 01/08/2020-felt the dyspnea is more related to pulmonary issues than cardiac; fairly normal echocardiogram.   OV 03/04/20 Brittany [name] is a 64 year old woman with a history of asthma who presents for follow-up.  She recently had an office visit on 02/11/2019 on with NP Parrett (note reviewed).  At that time her asthma was well controlled.  She was on Symbicort twice daily, albuterol only as needed.  She was using saline sinus rinses and Afrin for short period due to upper airway cough syndrome from postnasal drip.   She reports that she notices allergy symptoms, and these are her baseline.  She has been afraid to exercise since having an episode of lightheadedness when getting off the treadmill about a month ago.  She felt like she had overdone it by doing her exercises in bed without resting before getting on the treadmill.  She did not require albuterol to control her breathing, rather focused on slowing her breathing consciously.  She wants to know if she can safely resume exercise.  Humidity seems to make her symptoms worse, so she prefers to exercise on a treadmill.  OV 09/03/19: Brittany [name] is a 64 y/o woman following up for asthma and allergic rhinitis. She had an exacerbation of her asthma with persistent symptoms following Covid pneumonia in 2020.  Her symptoms have continued to improve.  Her allergies have been worse than normal recently due to pollen, but she is overall breathing better. Her asthma is better controlled-her cough and shortness of breath are both improved.  She is needing albuterol about once per month, less and less over time.  She sometimes feels this though she needs it during exercise.  She is continued on Symbicort twice daily, Singulair, Zyrtec, Flonase.  She was unable to afford azelastine nasal spray.  She has some ongoing throat congestion hoarseness after talking for a while that persists since she had Covid.   OV 06/17/2019: Brittany [name] is a 64 year old woman with a history of asthma who presents for follow-up.  Her fevers she was having  at her last visit resolved after a course of doxycycline.  She has been feeling well.  She has been off her oxygen for 8 days now, and has been monitoring her saturations at home.  Her dyspnea on exertion has been slowly improving, and she is getting faster and more independent with her ADLs.  For the past 2 days she is no longer needed to sit down while showering.  At her last visit we escalate her therapy from Flovent to [facility] twice daily.  She  continues on Singulair, Zyrtec, and Flonase.  Overall she is doing well without wheezing or dyspnea on exertion.  Her cough is less than it was at her baseline prior to Covid.  She has had some yellow sputum for the last few days, but is not having more sputum than her usual or coughing more frequently.  Her baseline sputum is clear.  She has some ongoing allergies and postnasal drip with more rhinorrhea and sneezing the last few days.  She denies fever, chills, sweats.  She has pet cats, which she does not want to get rid of.  She knows she is allergic to them.  She continues to allow them to sleep in her bed.  She is to air purifiers in her house.  She is continue to avoid tobacco since quitting 30 years ago.    televisit 05/20/2019: Brittany [name] is a 64 year old woman who presents for follow-up.  She has a history of lifelong asthma.  She is recovering from recent COVID-19 viral pneumonia.  She was hospitalized at [facility] from November 3 to November 14.  She was discharged on home oxygen, which she has been using regularly.  She continues to use it at night, but is able to stay off of it sometimes during the day.  Currently her saturations are 93%.  While hospitalized she received steroids, remdesivir, convalescent plasma and Tocilizumab.  She was discharged on Eliquis for her high D-dimer, but did not have a confirmed DVT.  Her primary care physician is following her D-dimer to determine the appropriate time to stop Eliquis.  Since discharge she has had periodic epistaxis, which she has been managing with Neosporin on the inside her nose.  Her asthma was not fully controlled prior to her hospitalization, and it has been more noticeable since discharge.  It is improving; she has not used in a nebulizer at all today.  At first when she came home she was using them 3 times a day, was able to go down to twice a day.  She has had a fever since yesterday, and thinks that she may have a sinus infection.  She  was recently prescribed doxycycline for small bowel bacterial overgrowth, but she is hoping this will help her sinuses.  She has chronic allergies to dust, mold, and cats.  She refuses to give up her cats.  She follows with Dr. Fredderick Phenix from allergy and immunology.  She has care filters in her house to help with her allergies.  She is on Flovent twice daily with albuterol nebulizers.  She has been on Symbicort in the past, but had trouble affording this medication.  She has wheezing and shortness of breath.  Exercise triggers her asthma symptoms.      Flowsheet data:  Lab Results  Component Value Date   NITRICOXIDE 14 07/16/2016    Past Medical History:  Diagnosis Date  . Abnormal EKG   . Anxiety   . Asthma   . Bipolar depression ([place]),  Anxiety, PTSD, Panic disorder    -managed by [facility]  . Depression   . Foot pain   . GERD (gastroesophageal reflux disease)   . Heart murmur    as a child  . Insomnia   . Leg swelling   . Osteoporosis   . Tachycardia      Family History  Problem Relation Age of Onset  . Heart failure Mother   . Hypertension Mother   . Hyperlipidemia Mother   . Congestive Heart Failure Mother   . Other Father        killed  . COPD Paternal 39        a lot of aunts and uncle COPD or Emphysema  . Emphysema Paternal Uncle   . Colon polyps Maternal Aunt   . COPD Brother   . Heart disease Brother   . Pulmonary embolism Brother   . Breast cancer Neg Hx   . Colon cancer Neg Hx   . Esophageal cancer Neg Hx   . Pancreatic cancer Neg Hx   . Stomach cancer Neg Hx   . Liver disease Neg Hx      Past Surgical History:  Procedure Laterality Date  . BREAST BIOPSY Right    biopsy of nipple  . CARPAL TUNNEL RELEASE Left   . CESAREAN SECTION     x 2  . CHOLECYSTECTOMY    . ENDOMETRIAL ABLATION    . KNEE SURGERY Right    reconstruction for patellar dislocation  . LAPAROSCOPY     x 2  . TUBAL LIGATION    . ULNAR NERVE REPAIR Left      Social History   Socioeconomic History  . Marital status: Single    Spouse name: Not on file  . Number of children: 3  . Years of education: Not on file  . Highest education level: Not on file  Occupational History  . Occupation: disabled  Tobacco Use  . Smoking status: Former Smoker    Packs/day: 20.00    Years: 1.00    Pack years: 20.00    Quit date: 06/19/1999    Years since quitting: 20.8  . Smokeless tobacco: Never Used  . Tobacco comment: 20 year estimate but probably less   Vaping Use  . Vaping Use: Never used  Substance and Sexual Activity  . Alcohol use: Yes    Alcohol/week: 0.0 standard drinks    Comment: Occass.   . Drug use: No  . Sexual activity: Never  Other Topics Concern  . Not on file  Social History Narrative   Work or School: Disabled seconndary to psychiatric conditions      Home Situation: lives alone with 2 cats and one dog      Spiritual Beliefs: Christian      Lifestyle: no regular exercise, diet  Not great - wants to embark on healthier lifestyle   Social Determinants of Health   Financial Resource Strain: Medium Risk  . Difficulty of Paying Living Expenses: Somewhat hard  Food Insecurity: No Food Insecurity  . Worried About Charity fundraiser in the Last Year: Never true  . Ran Out of Food in the Last Year: Never true  Transportation Needs: No Transportation Needs  . Lack of Transportation (Medical): No  . Lack of Transportation (Non-Medical): No  Physical Activity: Inactive  . Days of Exercise per Week: 0 days  . Minutes of Exercise per Session: 0 min  Stress: Stress Concern Present  . Feeling of Stress :  To some extent  Social Connections: Moderately Isolated  . Frequency of Communication with Friends and Family: Twice a week  . Frequency of Social Gatherings with Friends and Family: Once a week  . Attends Religious Services: 1 to 4 times per year  . Active Member of Clubs or Organizations: No  . Attends Archivist  Meetings: Never  . Marital Status: Never married  Intimate Partner Violence: Not At Risk  . Fear of Current or Ex-Partner: No  . Emotionally Abused: No  . Physically Abused: No  . Sexually Abused: No     Allergies  Allergen Reactions  . Aspirin Other (See Comments)    discomfort  . Codeine Itching  . Sulfamethoxazole-Trimethoprim Other (See Comments)    Unknown reaction per pt  . Trileptal [Oxcarbazepine]     itching  . Trimethoprim Other (See Comments)    Unknown, per pt  . Ultram [Tramadol] Itching  . Vioxx [Rofecoxib]     Unknown per pt     Immunization History  Administered Date(s) Administered  . Influenza Split 04/26/2011  . Influenza Whole 03/18/2009, 03/18/2010, 03/18/2016  . Influenza,inj,Quad PF,6+ Mos 04/19/2014, 02/28/2017, 03/06/2018, 02/13/2019, 03/04/2020  . PFIZER SARS-COV-2 Vaccination 09/04/2019, 09/29/2019, 04/06/2020  . Pneumococcal Conjugate-13 03/10/2015  . Pneumococcal Polysaccharide-23 06/18/2005  . Tdap 12/04/2016  . Zoster Recombinat (Shingrix) 01/01/2018, 06/22/2018    Outpatient Medications Prior to Visit  Medication Sig Dispense Refill  . albuterol (PROVENTIL) (2.5 MG/3ML) 0.083% nebulizer solution Take 3 mLs (2.5 mg total) by nebulization every 6 (six) hours as needed for wheezing or shortness of breath. 75 mL 12  . Albuterol Sulfate (PROAIR RESPICLICK) 884 (90 Base) MCG/ACT AEPB Inhale 2 puffs into the lungs every 6 (six) hours as needed. 1 each 1  . Biotin 1 MG CAPS Take by mouth.    . budesonide-formoterol (SYMBICORT) 160-4.5 MCG/ACT inhaler Inhale 2 puffs into the lungs 2 (two) times daily. 1 Inhaler 11  . busPIRone (BUSPAR) 15 MG tablet TAKE ONE TABLET BY MOUTH THREE TIMES A DAY 270 tablet 0  . cetirizine (ZYRTEC) 10 MG tablet Take 1 tablet (10 mg total) by mouth daily. 90 tablet 1  . Cholecalciferol (VITAMIN D-3 PO) Take 10,000 Units by mouth daily.     . colestipol (COLESTID) 1 g tablet Take 1 g by mouth as directed. Takes 2 tablets  in the am and 1 tablet in the evening    . diazepam (VALIUM) 5 MG tablet TAKE ONE TABLET BY MOUTH EVERY 6 HOURS AS NEEDED FOR ANXIETY 120 tablet 3  . dicyclomine (BENTYL) 20 MG tablet Take 1 tablet (20 mg total) by mouth 4 (four) times daily as needed for spasms. 120 tablet 3  . doxazosin (CARDURA) 4 MG tablet TAKE TWO TABLETS BY MOUTH EVERY NIGHT AT BEDTIME (Patient taking differently: Take 4 mg by mouth at bedtime. ) 180 tablet 1  . famotidine (PEPCID) 20 MG tablet TAKE ONE TABLET BY MOUTH TWICE A DAY 180 tablet 1  . ferrous sulfate 325 (65 FE) MG EC tablet Take 325 mg by mouth 2 (two) times a week.    . fluticasone (FLONASE) 50 MCG/ACT nasal spray SPRAY TWO SPRAYS IN EACH NOSTRIL TWICE DAILY 96 mL 2  . mirtazapine (REMERON) 15 MG tablet TAKE ONE TABLET BY MOUTH EVERY NIGHT AT BEDTIME 90 tablet 0  . montelukast (SINGULAIR) 10 MG tablet TAKE ONE TABLET BY MOUTH AT BEDTIME 90 tablet 1  . Multiple Vitamin (MULTIVITAMIN WITH MINERALS) TABS tablet Take 1 tablet  by mouth daily.    Marland Kitchen omega-3 acid ethyl esters (LOVAZA) 1 g capsule Take by mouth daily.    . pantoprazole (PROTONIX) 40 MG tablet Take 1 tablet (40 mg total) by mouth 2 (two) times daily. 180 tablet 3  . topiramate (TOPAMAX) 100 MG tablet Take 1 tablet (100 mg total) by mouth daily. 30 tablet 1   No facility-administered medications prior to visit.    Review of Systems  Constitutional: Positive for malaise/fatigue. Negative for chills, diaphoresis, fever and weight loss.  HENT: Negative for congestion and nosebleeds.   Respiratory: Positive for cough, shortness of breath and wheezing.   Gastrointestinal: Positive for heartburn. Negative for nausea.  Genitourinary: Negative.   Musculoskeletal: Negative for joint pain and myalgias.  Neurological: Negative for dizziness, weakness and headaches.     Objective:   Vitals:   04/25/20 1015  BP: 110/76  Pulse: 77  Temp: 98.2 F (36.8 C)  TempSrc: Oral  SpO2: 97%  Weight: 187 lb 3.2  oz (84.9 kg)  Height: 5' 1.5" (1.562 m)   97% on  RA BMI Readings from Last 3 Encounters:  04/25/20 34.80 kg/m  04/06/20 34.13 kg/m  03/18/20 34.58 kg/m   Wt Readings from Last 3 Encounters:  04/25/20 187 lb 3.2 oz (84.9 kg)  04/06/20 183 lb 9.6 oz (83.3 kg)  03/18/20 186 lb (84.4 kg)    Physical Exam Vitals reviewed.  Constitutional:      Appearance: She is obese. She is not ill-appearing.  HENT:     Head: Normocephalic and atraumatic.  Eyes:     General: No scleral icterus. Cardiovascular:     Rate and Rhythm: Normal rate and regular rhythm.     Heart sounds: No murmur heard.   Pulmonary:     Comments: CTAB, breathing comfortably on RA. No conversational dyspnea. Abdominal:     General: There is no distension.     Palpations: Abdomen is soft.  Musculoskeletal:        General: No swelling or deformity.     Cervical back: Neck supple.  Lymphadenopathy:     Cervical: No cervical adenopathy.  Skin:    General: Skin is warm and dry.     Findings: No rash.  Neurological:     General: No focal deficit present.     Mental Status: She is alert.     Coordination: Coordination normal.  Psychiatric:        Mood and Affect: Mood normal.        Behavior: Behavior normal.      CBC    Component Value Date/Time   WBC 4.7 04/06/2020 0916   RBC 4.83 04/06/2020 0916   HGB 15.3 04/06/2020 0916   HCT 45.7 (H) 04/06/2020 0916   PLT 273 04/06/2020 0916   MCV 94.6 04/06/2020 0916   MCH 31.7 04/06/2020 0916   MCHC 33.5 04/06/2020 0916   RDW 12.8 04/06/2020 0916   LYMPHSABS 855 04/06/2020 0916   MONOABS 0.5 02/11/2020 1237   EOSABS 42 04/06/2020 0916   BASOSABS 52 04/06/2020 0916     Chest Imaging- films reviewed: HRCT Chest 02/2020  Moderate patchy ground glass opacity, reticulation and septal thickening throughout both lungs with associated mild traction bronchiectasis and arcitectural distortion, with an upper lobe predominance, asymmetrically prominent on the  left. No frank honeycombing. Previously noted acute opacities on 04/22/2019 chest CTA have improved/resolved while the fibrotic components are new.   CXR, 2 view 05/12/2019-left greater than right opacities and reticulation.  Possible posterior pleural effusion silhouetting hemidiaphragm  CTA chest 04/22/2019-left greater than right peripheral groundglass opacities.  No PE.  CXR 02/11/2020-increased interstitial markings throughout  Echocardiogram 01/26/20: LVEF 60 to 65%, no regional wall motion abnormalities.  Mild concentric LVH, moderate basal septal hypertrophy.  Normal LA.  RV with mildly reduced function, normal size.  Normal RA.  Normal valves.  Pulmonary Functions Testing Results: PFT Results Latest Ref Rng & Units 09/03/2019  FVC-Pre L 2.55  FVC-Predicted Pre % 86  FVC-Post L 2.71  FVC-Predicted Post % 91  Pre FEV1/FVC % % 70  Post FEV1/FCV % % 67  FEV1-Pre L 1.78  FEV1-Predicted Pre % 78  FEV1-Post L 1.83  DLCO uncorrected ml/min/mmHg 16.97  DLCO UNC% % 91  DLCO corrected ml/min/mmHg 16.97  DLCO COR %Predicted % 91  DLVA Predicted % 98  TLC L 4.48  TLC % Predicted % 94  RV % Predicted % 99   2021- no significant obstruction or bronchodilator reversibility.  No restriction, hyperinflation, or air trapping.  Normal diffusion.  Assessment & Plan:     ICD-10-CM   1. Moderate persistent asthma without complication  V78.46   2. Chronic dyspnea  R06.09   3. Snoring  R06.83 Home sleep test   Asthma -Continue Symbicort twice daily.  Continue rinse her mouth after every use. -Continue albuterol as needed  Chronic dyspnea on exertion.   - PFTs do not support a diagnosis of uncontrolled asthma to explain her symptoms.  - Her symptoms are likely a component of deconditioning and weight gain.  - Encouraged routine exercise   Snoring - Concern for underlying obstructive sleep apnea, will order a home sleep study   Allergic rhinosinusitis -Continue Zyrtec and  Flonase  Follow up in 3 months  Freda Jackson, MD [facility] Office: [phone]   See Amion for Pager Details     Current Outpatient Medications:  .  albuterol (PROVENTIL) (2.5 MG/3ML) 0.083% nebulizer solution, Take 3 mLs (2.5 mg total) by nebulization every 6 (six) hours as needed for wheezing or shortness of breath., Disp: 75 mL, Rfl: 12 .  Albuterol Sulfate (PROAIR RESPICLICK) 244 (90 Base) MCG/ACT AEPB, Inhale 2 puffs into the lungs every 6 (six) hours as needed., Disp: 1 each, Rfl: 1 .  Biotin 1 MG CAPS, Take by mouth., Disp: , Rfl:  .  budesonide-formoterol (SYMBICORT) 160-4.5 MCG/ACT inhaler, Inhale 2 puffs into the lungs 2 (two) times daily., Disp: 1 Inhaler, Rfl: 11 .  busPIRone (BUSPAR) 15 MG tablet, TAKE ONE TABLET BY MOUTH THREE TIMES A DAY, Disp: 270 tablet, Rfl: 0 .  cetirizine (ZYRTEC) 10 MG tablet, Take 1 tablet (10 mg total) by mouth daily., Disp: 90 tablet, Rfl: 1 .  Cholecalciferol (VITAMIN D-3 PO), Take 10,000 Units by mouth daily. , Disp: , Rfl:  .  colestipol (COLESTID) 1 g tablet, Take 1 g by mouth as directed. Takes 2 tablets in the am and 1 tablet in the evening, Disp: , Rfl:  .  diazepam (VALIUM) 5 MG tablet, TAKE ONE TABLET BY MOUTH EVERY 6 HOURS AS NEEDED FOR ANXIETY, Disp: 120 tablet, Rfl: 3 .  dicyclomine (BENTYL) 20 MG tablet, Take 1 tablet (20 mg total) by mouth 4 (four) times daily as needed for spasms., Disp: 120 tablet, Rfl: 3 .  doxazosin (CARDURA) 4 MG tablet, TAKE TWO TABLETS BY MOUTH EVERY NIGHT AT BEDTIME (Patient taking differently: Take 4 mg by mouth at bedtime. ), Disp: 180 tablet, Rfl: 1 .  famotidine (PEPCID) 20 MG tablet, TAKE ONE TABLET BY MOUTH TWICE A DAY, Disp: 180 tablet, Rfl: 1 .  ferrous sulfate 325 (65 FE) MG EC tablet, Take 325 mg by mouth 2 (two) times a week., Disp: , Rfl:  .  fluticasone (FLONASE) 50 MCG/ACT nasal spray, SPRAY TWO SPRAYS IN EACH NOSTRIL TWICE DAILY, Disp: 96 mL, Rfl: 2 .  mirtazapine  (REMERON) 15 MG tablet, TAKE ONE TABLET BY MOUTH EVERY NIGHT AT BEDTIME, Disp: 90 tablet, Rfl: 0 .  montelukast (SINGULAIR) 10 MG tablet, TAKE ONE TABLET BY MOUTH AT BEDTIME, Disp: 90 tablet, Rfl: 1 .  Multiple Vitamin (MULTIVITAMIN WITH MINERALS) TABS tablet, Take 1 tablet by mouth daily., Disp: , Rfl:  .  omega-3 acid ethyl esters (LOVAZA) 1 g capsule, Take by mouth daily., Disp: , Rfl:  .  pantoprazole (PROTONIX) 40 MG tablet, Take 1 tablet (40 mg total) by mouth 2 (two) times daily., Disp: 180 tablet, Rfl: 3 .  topiramate (TOPAMAX) 100 MG tablet, Take 1 tablet (100 mg total) by mouth daily., Disp: 30 tablet, Rfl: [place], MD [facility] 04/27/2020 1:43 PM

## 2020-04-25 NOTE — Patient Instructions (Addendum)
Continue Symbicort 2 puffs twice daily Continue albuterol as needed We will schedule you for a home sleep study to check for obstructive sleep apnea Start walking 15-30 minutes 3 times per week and work up to 5 days per week  Food Choices for Gastroesophageal Reflux Disease, Adult When you have gastroesophageal reflux disease (GERD), the foods you eat and your eating habits are very important. Choosing the right foods can help ease your discomfort. Think about working with a nutrition specialist (dietitian) to help you make good choices. What are tips for following this plan?  Meals  Choose healthy foods that are low in fat, such as fruits, vegetables, whole grains, low-fat dairy products, and lean meat, fish, and poultry.  Eat small meals often instead of 3 large meals a day. Eat your meals slowly, and in a place where you are relaxed. Avoid bending over or lying down until 2-3 hours after eating.  Avoid eating meals 2-3 hours before bed.  Avoid drinking a lot of liquid with meals.  Cook foods using methods other than frying. Bake, grill, or broil food instead.  Avoid or limit: ? Chocolate. ? Peppermint or spearmint. ? Alcohol. ? Pepper. ? Black and decaffeinated coffee. ? Black and decaffeinated tea. ? Bubbly (carbonated) soft drinks. ? Caffeinated energy drinks and soft drinks.  Limit high-fat foods such as: ? Fatty meat or fried foods. ? Whole milk, cream, butter, or ice cream. ? Nuts and nut butters. ? Pastries, donuts, and sweets made with butter or shortening.  Avoid foods that cause symptoms. These foods may be different for everyone. Common foods that cause symptoms include: ? Tomatoes. ? Oranges, lemons, and limes. ? Peppers. ? Spicy food. ? Onions and garlic. ? Vinegar. Lifestyle  Maintain a healthy weight. Ask your doctor what weight is healthy for you. If you need to lose weight, work with your doctor to do so safely.  Exercise for at least 30 minutes for  5 or more days each week, or as told by your doctor.  Wear loose-fitting clothes.  Do not smoke. If you need help quitting, ask your doctor.  Sleep with the head of your bed higher than your feet. Use a wedge under the mattress or blocks under the bed frame to raise the head of the bed. Summary  When you have gastroesophageal reflux disease (GERD), food and lifestyle choices are very important in easing your symptoms.  Eat small meals often instead of 3 large meals a day. Eat your meals slowly, and in a place where you are relaxed.  Limit high-fat foods such as fatty meat or fried foods.  Avoid bending over or lying down until 2-3 hours after eating.  Avoid peppermint and spearmint, caffeine, alcohol, and chocolate. This information is not intended to replace advice given to you by your health care provider. Make sure you discuss any questions you have with your health care provider. Document Revised: 09/25/2018 Document Reviewed: 07/10/2016 Elsevier Patient Education  [place].

## 2020-04-27 ENCOUNTER — Telehealth: Payer: Self-pay | Admitting: Pulmonary Disease

## 2020-04-27 NOTE — Telephone Encounter (Signed)
Nothing further needed at this time. 

## 2020-04-27 NOTE — Telephone Encounter (Addendum)
Pt would like to postpone HST until next year.  Pt will let us know when she is ready to schedule.

## 2020-04-27 NOTE — Telephone Encounter (Signed)
Spoke with pt who wanted to verify that [employer] application was faxed. Application was located with documenting stating application was successfully faxed on 04/25/20. Pt ask to have office to call [employer] [facility] to verify recpit. Attempted to call and was placed on hold for 15 mins. Pt also states she would like to hold off on sleep study until after start of 2022.  Forward to [facility] for FYI on sleep study

## 2020-05-02 ENCOUNTER — Telehealth: Payer: Self-pay

## 2020-05-02 NOTE — Telephone Encounter (Signed)
SIBO results received and placed on Dr. Tarri Glenn desk for her review.

## 2020-05-02 NOTE — Telephone Encounter (Signed)
Called [facility] to follow up on status of results and or receipt of kit from pt. States kit was received and will re-fax results. Will await receipt of results.

## 2020-05-15 ENCOUNTER — Other Ambulatory Visit: Payer: Self-pay | Admitting: Psychiatry

## 2020-05-15 DIAGNOSIS — F4310 Post-traumatic stress disorder, unspecified: Secondary | ICD-10-CM

## 2020-05-17 ENCOUNTER — Other Ambulatory Visit: Payer: Self-pay | Admitting: Gastroenterology

## 2020-05-23 NOTE — Telephone Encounter (Signed)
Pt scheduled to see Dr. Tarri Glenn 05/25/20@1 :50pm. She is aware of test results.

## 2020-05-23 NOTE — Telephone Encounter (Signed)
Her breath test is negative for bacterial overgrowth in the small intestines. Please offer her a follow-up with me to discuss these results. Thanks.

## 2020-05-23 NOTE — Telephone Encounter (Signed)
Pt calling for SIBO test results. Please advise.

## 2020-05-23 NOTE — Telephone Encounter (Signed)
Pt is requesting a call back from a nurse to discuss her SIBO results.

## 2020-05-25 ENCOUNTER — Other Ambulatory Visit: Payer: PPO

## 2020-05-25 ENCOUNTER — Encounter: Payer: Self-pay | Admitting: Gastroenterology

## 2020-05-25 ENCOUNTER — Ambulatory Visit: Payer: PPO | Admitting: Gastroenterology

## 2020-05-25 VITALS — BP 120/78 | HR 84 | Ht 61.5 in | Wt 182.1 lb

## 2020-05-25 DIAGNOSIS — K219 Gastro-esophageal reflux disease without esophagitis: Secondary | ICD-10-CM | POA: Diagnosis not present

## 2020-05-25 DIAGNOSIS — R1084 Generalized abdominal pain: Secondary | ICD-10-CM

## 2020-05-25 DIAGNOSIS — R197 Diarrhea, unspecified: Secondary | ICD-10-CM | POA: Diagnosis not present

## 2020-05-25 DIAGNOSIS — R140 Abdominal distension (gaseous): Secondary | ICD-10-CM | POA: Diagnosis not present

## 2020-05-25 MED ORDER — FAMOTIDINE 20 MG PO TABS: 20.0000 mg | ORAL_TABLET | Freq: Two times a day (BID) | ORAL | 3 refills | Status: DC

## 2020-05-25 MED ORDER — FDGARD 25-20.75 MG PO CAPS: ORAL_CAPSULE | ORAL | 0 refills | Status: DC

## 2020-05-25 NOTE — Patient Instructions (Addendum)
I have recommended some stool studies to further evaluate your symptoms.  LABS: Your provider has requested that you go to the basement level for lab work before leaving today. Press "B" on the elevator. The lab is located at the first door on the left as you exit the elevator.  HEALTHCARE LAWS AND MY CHART RESULTS: Due to recent changes in healthcare laws, you may see the results of your imaging and laboratory studies on MyChart before your provider has had a chance to review them.  We understand that in some cases there may be results that are confusing or concerning to you. Not all laboratory results come back in the same time frame and the provider may be waiting for multiple results in order to interpret others.  Please give [place] 48 hours in order for your provider to thoroughly review all the results before contacting the office for clarification of your results.   I am recommending a trial of FDGard - 2 capsules taken twice daily for 4 weeks.  Please take FDGard 30 to 60 minutes before meals with water.  There is no distinct pattern of side effects with FDGard, but, sometimes patients experience a mild tingling sensation in the gut within the first 30 minutes. This sensation should subsequently subside.   SAMPLES:   . FDGard - take as prescribed above   I am recommending a sucrose challenge to determine if disaccharide deficiency is contributing to your symptoms. The risk of this test is having severe symptoms afterwards.   This is a test that you can do at home.  Step 1: Stir 4 tablespoons of ordinary table sugar into a 4 ounce glass of water.  Mix until the sugar is completely dissolved.  Step 2: Drink the solution on an empty stomach.  Step 3: Monitor your symptoms over the next 4-8 hours. Document any symptoms that you experience.  In particular I want you to pay attention to bloating, gas, abdominal pain, and diarrhea.  Please document all of these symptoms and send me a summary via  MyChart or call the office with your results.  I will see you back in 3-4 weeks, or sooner if needed.  If you are age 64 or younger, your body mass index should be between 19-25. Your Body mass index is 33.85 kg/m. If this is out of the aformentioned range listed, please consider follow up with your Primary Care Provider.   Thank you for trusting me with your gastrointestinal care!    Thornton Park, MD, MPH

## 2020-05-25 NOTE — Progress Notes (Signed)
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 Collene Mares in 2009, treated with Align    - Normal duodenal and colon biopsies 04/09/2018    - using Immodium PRN.     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 Collene Mares 2009    - no polyps on colonoscopy 04/09/2018  Cholecystectomy for cholelithiasis 16 years ago  SIBO +/- d-IBS:  Symptoms dramatically improved with empiric trial of rifaxamin/doxycycline but return within 2 weeks of completing treatment. Repeat breath testing negative for SIBO. Recommend sucrose challenge to screen for disaccharide deficiency. Stools studies to screen for PEI, giardia, and a fecal calprotectin for inflammation. She has stopped taking colestipol. Will increase frequency of dicyclomine. Trial of FDGard in the meantime given the strong symptoms overlap of functional dyspepsia on symptomatology today.  Will consider Elavil +/- Baclofen for symptoms management if there is no  improvement with FDGard.   Moderate reflux on esophagram: Initially diagnosed in 2008 as part of asthma evaluation. Failed trial off of famotidine. Continue pantoprazole 40 mg BID in addition to famotidine. Lifestyle modifications.   PLAN: Continue to avoid dairy and other food triggers She stopped taking colestipol 1g BID Continue dicyclomine 20 mg QID - taken before meals Continue pantoprazole 40 mg BID in addition to famotidine Trial of FDGard (samples provided today) Stool for fecal calprotectin, fecal elastase, giardia Consider TCA versus Baclofen Follow-up in 3-4 weeks, earlier if needed   HPI: Brittany [name] is a 64 y.o. female who returns in follow-up with ongoing abdominal pain described as a hot pit in her stomach, 2-10 daily bowel movements, bloating, early satiety, eructation and nausea.  Bothered by a large amount of ongoing gas. No change in symptoms with eating or movement. Exacerbation in all symptoms after having Covid 04/2019.   Endoscopic evaluation 04/09/2018 revealed pancolonic diverticulosis and was otherwise normal. Gastric biopsy showed chronic inactive gastritis with no evidence of H. pylori.  Duodenum biopsies and random colon biopsies were normal.  She had an incomplete response to colestipol and did not tolerate a dose increase to 2 BID due to severe constipation.   Two weeks of Xifaxan provided significant relief in her symptoms. She had no significant bloating or diarrhea during this time.  However all symptoms returned within 2 weeks of completing treatment.   Lactulose testing for small intestinal bacterial overgrowth 04/14/2019 showed suspected bacterial overgrowth.  Increase in hydrogen was 12 ppm, increase in methane 3 ppm, increase in combined hydrogen and methane 15 ppm  which is the cutoff for diagnosis.  Called in May 2021 with recurrent symptoms with explosive diarrhea and lots of gas. Requested another trial of doxycycline 100 mg BID x 14  days.  Initially felt better, but, symptoms recurred within 2 weeks.    Seen by nutritionist. Bonne Dolores a low FODMAP diet but it is difficult due to complexity and cost.  Stressed by the cost of medical bills.   Small intestinal bacterial overgrowth hydrogen and methane breath results 04/04/2020 showed an increase in hydrogen by 18 ppm, increase in methane by 4 ppm and a total increase in combined hydrogen and methane by 22 ppm.  However, most of this happened after 100 minutes which is not consistent with SIBO.  Returns today in follow-up after her last breath test.  Continues to have episodes of severe abdominal pain and cramping triggered by daily products, sugars, or sugar substitutes including sugar alcohols. Has to lie in the fetal position with severe symptoms.  No noted association with mammalian meats. Has identified dairy products and sugar as triggers. ChikFilA sandaiches were the only thing that she could eat for quite a while, but now she can't even eat those.   Switching to plant-based dairy products has provided some relief.    Notes that she tried to stop her famotidine a couple of weeks ago but developed recurrent reflux. Symptoms resolved after resuming famotidine.   Concerned that she might have an ulcer due to the association of stress with her symptoms. Abdominal pain/burning increases with stress and anxiety.   Her daughter wonders if she might have EPI.   Labs from 02/11/20: normal CBC and BMP Labs 04/06/20: hemoglobin 15.3, platelets 273, MCV 94.6, RDW 12.8, iron 78, ferritin 36, % sat 21  She started iron twice weekly at that time as recommended by Dr. Ethlyn Gallery.  Stools fluctuate between brown and black on iron   Pulmonologist has recommended that she raise the head of the bed by 6 inches. She hasn't noticed a difference.   Prior endoscopy: Upper endoscopy and colonoscopy 04/09/2018  revealed pancolonic diverticulosis and was otherwise normal. Gastric biopsy showed chronic  inactive gastritis with no evidence of H. pylori.  Duodenum biopsies and random colon biopsies were normal   Past Medical History:  Diagnosis Date  . Abnormal EKG   . Anxiety   . Asthma   . Bipolar depression ([place]), Anxiety, PTSD, Panic disorder    -managed by [facility]  . Depression   . Foot pain   . GERD (gastroesophageal reflux disease)   . Heart murmur    as a child  . Insomnia   . Leg swelling   . Osteoporosis   . Tachycardia     Past Surgical History:  Procedure Laterality Date  . BREAST BIOPSY Right    biopsy of nipple  . CARPAL TUNNEL RELEASE Left   . CESAREAN SECTION     x 2  . CHOLECYSTECTOMY    . ENDOMETRIAL ABLATION    . KNEE SURGERY Right    reconstruction for patellar dislocation  . LAPAROSCOPY     x 2  . TUBAL LIGATION    . ULNAR NERVE REPAIR Left     Current Outpatient Medications  Medication Sig Dispense Refill  . albuterol (PROVENTIL) (2.5 MG/3ML) 0.083% nebulizer solution Take 3 mLs (2.5 mg total) by nebulization every 6 (six) hours as needed for wheezing or shortness of breath. 75 mL 12  . Albuterol Sulfate (PROAIR RESPICLICK) 629 (90 Base) MCG/ACT AEPB Inhale  2 puffs into the lungs every 6 (six) hours as needed. 1 each 1  . Biotin 1 MG CAPS Take by mouth.    . budesonide-formoterol (SYMBICORT) 160-4.5 MCG/ACT inhaler Inhale 2 puffs into the lungs 2 (two) times daily. 1 Inhaler 11  . busPIRone (BUSPAR) 15 MG tablet TAKE ONE TABLET BY MOUTH THREE TIMES A DAY 270 tablet 0  . cetirizine (ZYRTEC) 10 MG tablet Take 1 tablet (10 mg total) by mouth daily. 90 tablet 1  . Cholecalciferol (VITAMIN D-3 PO) Take 10,000 Units by mouth daily.     . colestipol (COLESTID) 1 g tablet TAKE TWO TABLETS BY MOUTH TWICE A DAY 120 tablet 3  . diazepam (VALIUM) 5 MG tablet TAKE ONE TABLET BY MOUTH EVERY 6 HOURS AS NEEDED FOR ANXIETY 120 tablet 3  . dicyclomine (BENTYL) 20 MG tablet Take 1 tablet (20 mg total) by mouth 4 (four) times daily as needed for spasms.  120 tablet 3  . doxazosin (CARDURA) 4 MG tablet TAKE TWO TABLETS BY MOUTH EVERY NIGHT AT BEDTIME (Patient taking differently: Take 4 mg by mouth at bedtime. ) 180 tablet 1  . famotidine (PEPCID) 20 MG tablet TAKE ONE TABLET BY MOUTH TWICE A DAY 180 tablet 1  . ferrous sulfate 325 (65 FE) MG EC tablet Take 325 mg by mouth 2 (two) times a week.    . fluticasone (FLONASE) 50 MCG/ACT nasal spray SPRAY TWO SPRAYS IN EACH NOSTRIL TWICE DAILY 96 mL 2  . mirtazapine (REMERON) 15 MG tablet TAKE ONE TABLET BY MOUTH EVERY NIGHT AT BEDTIME 90 tablet 0  . montelukast (SINGULAIR) 10 MG tablet TAKE ONE TABLET BY MOUTH AT BEDTIME 90 tablet 1  . Multiple Vitamin (MULTIVITAMIN WITH MINERALS) TABS tablet Take 1 tablet by mouth daily.    Marland Kitchen omega-3 acid ethyl esters (LOVAZA) 1 g capsule Take by mouth daily.    . pantoprazole (PROTONIX) 40 MG tablet Take 1 tablet (40 mg total) by mouth 2 (two) times daily. 180 tablet 3  . topiramate (TOPAMAX) 100 MG tablet Take 1 tablet (100 mg total) by mouth daily. 30 tablet 1   No current facility-administered medications for this visit.    Allergies as of 05/25/2020 - Review Complete 05/25/2020  Allergen Reaction Noted  . Aspirin Other (See Comments)   . Codeine Itching 09/26/2017  . Sulfamethoxazole-trimethoprim Other (See Comments)   . Trileptal [oxcarbazepine]  11/28/2018  . Trimethoprim Other (See Comments) 11/12/2014  . Ultram [tramadol] Itching 09/26/2017  . Vioxx [rofecoxib]      Family History  Problem Relation Age of Onset  . Heart failure Mother   . Hypertension Mother   . Hyperlipidemia Mother   . Congestive Heart Failure Mother   . Other Father        killed  . COPD Paternal 60        a lot of aunts and uncle COPD or Emphysema  . Emphysema Paternal Uncle   . Colon polyps Maternal Aunt   . COPD Brother   . Heart disease Brother   . Pulmonary embolism Brother   . Breast cancer Neg Hx   . Colon cancer Neg Hx   . Esophageal cancer Neg Hx   .  Pancreatic cancer Neg Hx   . Stomach cancer Neg Hx   . Liver disease Neg Hx     Social History   Socioeconomic History  . Marital status: Single    Spouse name: Not on file  . Number of children: 3  .  Years of education: Not on file  . Highest education level: Not on file  Occupational History  . Occupation: disabled  Tobacco Use  . Smoking status: Former Smoker    Packs/day: 20.00    Years: 1.00    Pack years: 20.00    Quit date: 06/19/1999    Years since quitting: 20.9  . Smokeless tobacco: Never Used  . Tobacco comment: 20 year estimate but probably less   Vaping Use  . Vaping Use: Never used  Substance and Sexual Activity  . Alcohol use: Yes    Alcohol/week: 0.0 standard drinks    Comment: Occass.   . Drug use: No  . Sexual activity: Never  Other Topics Concern  . Not on file  Social History Narrative   Work or School: Disabled seconndary to psychiatric conditions      Home Situation: lives alone with 2 cats and one dog      Spiritual Beliefs: Christian      Lifestyle: no regular exercise, diet  Not great - wants to embark on healthier lifestyle   Social Determinants of Health   Financial Resource Strain: Medium Risk  . Difficulty of Paying Living Expenses: Somewhat hard  Food Insecurity: No Food Insecurity  . Worried About Charity fundraiser in the Last Year: Never true  . Ran Out of Food in the Last Year: Never true  Transportation Needs: No Transportation Needs  . Lack of Transportation (Medical): No  . Lack of Transportation (Non-Medical): No  Physical Activity: Inactive  . Days of Exercise per Week: 0 days  . Minutes of Exercise per Session: 0 min  Stress: Stress Concern Present  . Feeling of Stress : To some extent  Social Connections: Moderately Isolated  . Frequency of Communication with Friends and Family: Twice a week  . Frequency of Social Gatherings with Friends and Family: Once a week  . Attends Religious Services: 1 to 4 times per year  .  Active Member of Clubs or Organizations: No  . Attends Archivist Meetings: Never  . Marital Status: Never married  Intimate Partner Violence: Not At Risk  . Fear of Current or Ex-Partner: No  . Emotionally Abused: No  . Physically Abused: No  . Sexually Abused: No    Physical Exam: General:   Alert,  well-nourished, pleasant and cooperative in NAD.  Eyes are open. Head:  Normocephalic and atraumatic. Eyes:  Sclera clear, no icterus.   Conjunctiva pink. Abdomen:  Soft, nontender, nondistended, normal bowel sounds, no rebound or guarding. No hepatosplenomegaly.  No obvious ascites.  No abdominal wall hernias.  No succession splash. Neurologic:  Alert and  oriented x4;  grossly nonfocal Skin:  Intact without significant lesions or rashes. Psych:  Alert and cooperative. Normal mood and affect.   Ceasia Elwell L. Tarri Glenn, MD, MPH [facility] 05/25/2020, 2:01 PM

## 2020-05-26 ENCOUNTER — Other Ambulatory Visit: Payer: PPO

## 2020-05-26 DIAGNOSIS — R140 Abdominal distension (gaseous): Secondary | ICD-10-CM

## 2020-05-26 DIAGNOSIS — K219 Gastro-esophageal reflux disease without esophagitis: Secondary | ICD-10-CM | POA: Diagnosis not present

## 2020-05-26 DIAGNOSIS — R1084 Generalized abdominal pain: Secondary | ICD-10-CM

## 2020-05-26 DIAGNOSIS — R197 Diarrhea, unspecified: Secondary | ICD-10-CM | POA: Diagnosis not present

## 2020-05-31 ENCOUNTER — Ambulatory Visit: Payer: PPO | Admitting: Pulmonary Disease

## 2020-05-31 LAB — CALPROTECTIN, FECAL: Calprotectin, Fecal: 19 ug/g (ref 0–120)

## 2020-05-31 NOTE — Progress Notes (Signed)
Pt submitted specimen 05/26/20. Results should be available for review 06/01/20

## 2020-06-02 LAB — GIARDIA AND CRYPTOSPORIDIUM ANTIGEN PANEL
MICRO NUMBER:: 11297812
RESULT:: NOT DETECTED
SPECIMEN QUALITY:: ADEQUATE
Specimen Quality:: ADEQUATE
micro Number:: 11297811

## 2020-06-02 LAB — PANCREATIC ELASTASE, FECAL: Pancreatic Elastase-1, Stool: >500 mcg/g

## 2020-06-09 ENCOUNTER — Other Ambulatory Visit: Payer: Self-pay | Admitting: Psychiatry

## 2020-06-21 ENCOUNTER — Encounter: Payer: Self-pay | Admitting: Psychiatry

## 2020-06-21 ENCOUNTER — Telehealth (INDEPENDENT_AMBULATORY_CARE_PROVIDER_SITE_OTHER): Payer: PPO | Admitting: Psychiatry

## 2020-06-21 DIAGNOSIS — F4001 Agoraphobia with panic disorder: Secondary | ICD-10-CM

## 2020-06-21 DIAGNOSIS — F4310 Post-traumatic stress disorder, unspecified: Secondary | ICD-10-CM

## 2020-06-21 DIAGNOSIS — F4312 Post-traumatic stress disorder, chronic: Secondary | ICD-10-CM | POA: Diagnosis not present

## 2020-06-21 DIAGNOSIS — F515 Nightmare disorder: Secondary | ICD-10-CM

## 2020-06-21 DIAGNOSIS — F3162 Bipolar disorder, current episode mixed, moderate: Secondary | ICD-10-CM | POA: Diagnosis not present
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
## 2020-07-05 ENCOUNTER — Other Ambulatory Visit: Payer: PPO

## 2020-07-05 ENCOUNTER — Encounter: Payer: Self-pay | Admitting: Gastroenterology

## 2020-07-05 ENCOUNTER — Ambulatory Visit: Payer: PPO | Admitting: Gastroenterology

## 2020-07-05 VITALS — BP 110/70 | HR 96 | Ht 61.5 in | Wt 178.2 lb

## 2020-07-05 DIAGNOSIS — K219 Gastro-esophageal reflux disease without esophagitis: Secondary | ICD-10-CM | POA: Diagnosis not present

## 2020-07-05 DIAGNOSIS — E7431 Sucrase-isomaltase deficiency: Secondary | ICD-10-CM

## 2020-07-05 DIAGNOSIS — R197 Diarrhea, unspecified: Secondary | ICD-10-CM

## 2020-07-05 DIAGNOSIS — R634 Abnormal weight loss: Secondary | ICD-10-CM | POA: Diagnosis not present

## 2020-07-05 DIAGNOSIS — R140 Abdominal distension (gaseous): Secondary | ICD-10-CM | POA: Diagnosis not present

## 2020-07-05 MED ORDER — COLESTIPOL HCL 1 G PO TABS: 1.0000 g | ORAL_TABLET | Freq: Two times a day (BID) | ORAL | 3 refills | Status: DC

## 2020-07-05 NOTE — Progress Notes (Signed)
Referring Provider: Caren Macadam, MD Primary Care Physician:  Caren Macadam, MD   CHIEF COMPLAINT: frequent stools, bloating, reflux  IMPRESSION:  (1) Acute on chronic (>16 year history) of severe abdominal pain and cramping, 2-10 daily bowel movements, bloating, early satiety, eructation, nausea, and gas that seems to be triggered by dairy products, sugars, or sugar substitutes including sugar alcohols. No association with mammalian meats. Now with 8 pound weight loss in 3 months.  Has known lactulose intolerance. Extensive evaluation as outlined below.   Has had difficulty with low FODMAP diet due to cost and complexity.  Treatment for bile acid diarrhea provides limited relief of loose stools but not of other symptoms. Dicylcyclomine helps with some of the abdominal pain and cramping. Successful treatment for SIBO provided temporary relief, but, follow-up breath test to look for methane SIBO was negative.  FDGard was cost-prohibitive. Peppermint not tolerated due to exacerbation of reflux.   4-4-4 sucrose challenge was positive, suggesting disaccharide intolerance. Discussed treatment strategies including sucrase supplements OTC, following a diet that limits/avoids sugars, and could consider a trial of sacrosidase, but, I am afraid this will be cost prohibitive. I have encouraged her to follow-up with a nutritionist. I also offered referral to a tertiary care center given her growing frustration with persistent symptomatology.   She is concerned about a gastric ulcer and gastric cancer. Also concerned about C diff.   (2) Moderate reflux on esophagram: Initially diagnosed in 2008 as part of asthma evaluation. Failed trial off of famotidine. Continue pantoprazole 40 mg BID in addition to famotidine. Lifestyle modifications.   (3) History of colon polyps. tHREE hyperplastic polypsDr. Collene Mares 2009. No polyps on colonoscopy 04/09/2018  PLAN: - Continue to avoid dairy and other food  triggers - Continue colestipol 1g BID (3 month supply with 3 refills), she will titrate it as needed - Continue dicyclomine 20 mg QID - taken before meals - Continue pantoprazole 40 mg BID in addition to famotidine - GI pathogen panel  - Start OTC sucrase enzymes - Start daily probiotic if not responding to sucrase - Eliminate table sugar, beet sugar, brown sugar, cane sugar, caramel sugar, coconut sugar, confectioner's sugar, date sugar, and raw sugar.  Will reduce starch if still symptomatic. Consider sucrosidase but, I am afraid this will be cost-prohibitive given our challenges with other treatment options.  - Referral to nutritionist - Consider TCA versus Baclofen in the future - EGD for additional biopsies - Consider contrasted cross-sectional imaging as she has had no recent abdominal imaging/weight loss - Follow-up in 2-4 months, earlier as needed - Offered referral to tertiary care center if she would like to proceed   HPI: Brittany [name] is a 65 y.o. female who returns in follow-up. She continues to have episodes of severe abdominal pain and cramping described as a hot pit in her stomach, 2-10 daily bowel movements, bloating, early satiety, eructation, nausea, and gas that seems to be triggered by dairy products, sugars, or sugar substitutes including sugar alcohols. Has to lie in the fetal position with severe symptoms.  No noted association with mammalian meats. Bothered by a large amount of ongoing gas. No change in symptoms with eating or movement. Switching to plant-based dairy products has provided some relief. Requires postprandial Imodium and Lactaid to leave the house. Exacerbation in all symptoms after having Covid 04/2019 and with stress and anxiety.   History of evaluation and attempts at symptom management: - Symptoms initially started 16 years ago following cholecystectomy for  cholelithiasis - Prior diagnosis of IBS with Dr. Collene Mares, treated with Align - Endoscopic  evaluation 04/09/2018 revealed pancolonic diverticulosis and was otherwise normal. Gastric biopsy showed chronic inactive gastritis with no evidence of H. pylori.  Duodenum biopsies and random colon biopsies were normal. - Incomplete response to colestipol and initially did not tolerate a dose increase to 2 BID due to severe constipation. Since resuming colestipol, her stools are more formed. She has been titrating the dose to help control her symptoms.  - Two weeks of Xifaxan provided significant relief in her symptoms. She had no significant bloating or diarrhea during this time.  However all symptoms returned within 2 weeks of completing treatment.  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  Increase in hydrogen was 12 ppm, increase in methane 3 ppm, increase in combined hydrogen and methane 15 ppm which is the cutoff for diagnosis. - Possible SIBO retreated with doxycycline 100 mg BID x 14 days.  Initially felt better, but, symptoms recurred within 2 weeks.  - Seen by nutritionist but the interaction was not very helpful. Tried a low FODMAP diet but it is difficult due to complexity and cost. The nutritionist did not recommend any additional follow-up because she wasn't following the diet recommendation. - Normal CBC and BMP 02/11/20   - Repeat SIBO hydrogen and methane breath results 04/04/2020 showed an increase in hydrogen by 18 ppm, increase in methane by 4 ppm and a total increase in combined hydrogen and methane by 22 ppm.  However, most of this happened after 100 minutes which is not consistent with SIBO. - Labs 04/06/20: hemoglobin 15.3, platelets 273, MCV 94.6, RDW 12.8, iron 78, ferritin 36, % sat 21 - Labs 05/26/2020: Stool negative for Cryptosporidium and Giardia; fecal calprotectin 19, pancreatic elastase >500 (normal) - Unable to tolerate  peppermint due to heartburn.   - Unable to trial FDGard due to cost. - 4-4-4 sucrose challenge positive, suggesting disaccharide intolerance  Her symptoms persist without change. Restricted diet to minimize symptoms is growing tiring.  Her friend who works at a vet has brought up concerns about the possibility of C Diff.  She is also requesing an EGD because she is concerned about an ulcer and stomach cancer.   Review from prior visits shows that she has lost 8 pounds since 03/2020.   Prior endoscopy: Upper endoscopy and colonoscopy 04/09/2018  revealed pancolonic diverticulosis and was otherwise normal. Gastric biopsy showed chronic inactive gastritis with no evidence of H. pylori.  Duodenum biopsies and random colon biopsies were normal   Past Medical History:  Diagnosis Date   Abnormal EKG    Anxiety    Asthma    Bipolar depression ([place]), Anxiety, PTSD, Panic disorder    -managed by [facility]   Depression    Foot pain    GERD (gastroesophageal reflux disease)    Heart murmur    as a child   Insomnia    Leg swelling    Osteoporosis    Tachycardia     Past Surgical History:  Procedure Laterality Date   BREAST BIOPSY Right    biopsy of nipple   CARPAL TUNNEL RELEASE Left    CESAREAN SECTION     x 2   CHOLECYSTECTOMY     ENDOMETRIAL ABLATION     KNEE SURGERY Right    reconstruction for patellar dislocation   LAPAROSCOPY  x 2   TUBAL LIGATION     ULNAR NERVE REPAIR Left     Current Outpatient Medications  Medication Sig Dispense Refill   albuterol (PROVENTIL) (2.5 MG/3ML) 0.083% nebulizer solution Take 3 mLs (2.5 mg total) by nebulization every 6 (six) hours as needed for wheezing or shortness of breath. 75 mL 12   Albuterol Sulfate (PROAIR RESPICLICK) 123XX123 (90 Base) MCG/ACT AEPB Inhale 2 puffs into the lungs every 6 (six) hours as needed. 1 each 1   budesonide-formoterol (SYMBICORT) 160-4.5 MCG/ACT inhaler Inhale 2 puffs into the  lungs 2 (two) times daily. 1 Inhaler 11   busPIRone (BUSPAR) 15 MG tablet TAKE ONE TABLET BY MOUTH THREE TIMES A DAY 270 tablet 0   cetirizine (ZYRTEC) 10 MG tablet Take 1 tablet (10 mg total) by mouth daily. 90 tablet 1   Cholecalciferol (VITAMIN D-3 PO) Take 10,000 Units by mouth daily.      colestipol (COLESTID) 1 g tablet Take 1 g by mouth 2 (two) times daily.     diazepam (VALIUM) 5 MG tablet TAKE ONE TABLET BY MOUTH EVERY 6 HOURS AS NEEDED FOR ANXIETY 120 tablet 3   dicyclomine (BENTYL) 20 MG tablet Take 1 tablet (20 mg total) by mouth 4 (four) times daily as needed for spasms. 120 tablet 3   doxazosin (CARDURA) 4 MG tablet TAKE TWO TABLETS BY MOUTH EVERY NIGHT AT BEDTIME (Patient taking differently: Take 4 mg by mouth at bedtime.) 180 tablet 1   famotidine (PEPCID) 20 MG tablet Take 1 tablet (20 mg total) by mouth 2 (two) times daily. 180 tablet 3   ferrous sulfate 325 (65 FE) MG EC tablet Take 325 mg by mouth 2 (two) times a week.     fluticasone (FLONASE) 50 MCG/ACT nasal spray SPRAY TWO SPRAYS IN EACH NOSTRIL TWICE DAILY 96 mL 2   mirtazapine (REMERON) 15 MG tablet TAKE ONE TABLET BY MOUTH EVERY NIGHT AT BEDTIME 90 tablet 0   montelukast (SINGULAIR) 10 MG tablet TAKE ONE TABLET BY MOUTH AT BEDTIME 90 tablet 1   Multiple Vitamin (MULTIVITAMIN WITH MINERALS) TABS tablet Take 1 tablet by mouth daily.     omega-3 acid ethyl esters (LOVAZA) 1 g capsule Take by mouth daily.     pantoprazole (PROTONIX) 40 MG tablet Take 1 tablet (40 mg total) by mouth 2 (two) times daily. 180 tablet 3   topiramate (TOPAMAX) 100 MG tablet Take 1 tablet (100 mg total) by mouth daily. 30 tablet 1   Biotin 1 MG CAPS Take by mouth. (Patient not taking: Reported on 07/05/2020)     No current facility-administered medications for this visit.    Allergies as of 07/05/2020 - Review Complete 07/05/2020  Allergen Reaction Noted   Aspirin Other (See Comments)    Codeine Itching 09/26/2017    Sulfamethoxazole-trimethoprim Other (See Comments)    Trileptal [oxcarbazepine]  11/28/2018   Trimethoprim Other (See Comments) 11/12/2014   Ultram [tramadol] Itching 09/26/2017   Vioxx [rofecoxib]      Family History  Problem Relation Age of Onset   Heart failure Mother    Hypertension Mother    Hyperlipidemia Mother    Congestive Heart Failure Mother    Other Father        killed   COPD Paternal 99        a lot of aunts and uncle COPD or Emphysema   Emphysema Paternal Uncle    Colon polyps Maternal Aunt    COPD Brother    Heart  disease Brother    Pulmonary embolism Brother    Breast cancer Neg Hx    Colon cancer Neg Hx    Esophageal cancer Neg Hx    Pancreatic cancer Neg Hx    Stomach cancer Neg Hx    Liver disease Neg Hx     Social History   Socioeconomic History   Marital status: Single    Spouse name: Not on file   Number of children: 3   Years of education: Not on file   Highest education level: Not on file  Occupational History   Occupation: disabled  Tobacco Use   Smoking status: Former Smoker    Packs/day: 20.00    Years: 1.00    Pack years: 20.00    Quit date: 06/19/1999    Years since quitting: 21.0   Smokeless tobacco: Never Used   Tobacco comment: 20 year estimate but probably less   Vaping Use   Vaping Use: Never used  Substance and Sexual Activity   Alcohol use: Yes    Alcohol/week: 0.0 standard drinks    Comment: Occass.    Drug use: No   Sexual activity: Never  Other Topics Concern   Not on file  Social History Narrative   Work or School: Disabled seconndary to psychiatric conditions      Home Situation: lives alone with 2 cats and one dog      Spiritual Beliefs: Christian      Lifestyle: no regular exercise, diet  Not great - wants to embark on healthier lifestyle   Social Determinants of Health   Financial Resource Strain: Medium Risk   Difficulty of Paying Living Expenses: Somewhat hard   Food Insecurity: No Food Insecurity   Worried About Charity fundraiser in the Last Year: Never true   Ran Out of Food in the Last Year: Never true  Transportation Needs: No Transportation Needs   Lack of Transportation (Medical): No   Lack of Transportation (Non-Medical): No  Physical Activity: Inactive   Days of Exercise per Week: 0 days   Minutes of Exercise per Session: 0 min  Stress: Stress Concern Present   Feeling of Stress : To some extent  Social Connections: Moderately Isolated   Frequency of Communication with Friends and Family: Twice a week   Frequency of Social Gatherings with Friends and Family: Once a week   Attends Religious Services: 1 to 4 times per year   Active Member of [employer] or Organizations: No   Attends Archivist Meetings: Never   Marital Status: Never married  Human resources officer Violence: Not At Risk   Fear of Current or Ex-Partner: No   Emotionally Abused: No   Physically Abused: No   Sexually Abused: No    Physical Exam: General:   Alert,  well-nourished, pleasant and cooperative in NAD.  Eyes are open. Head:  Normocephalic and atraumatic. Eyes:  Sclera clear, no icterus.   Conjunctiva pink. Abdomen:  Soft, nontender, nondistended, normal bowel sounds, no rebound or guarding. No hepatosplenomegaly.  No obvious ascites.  No abdominal wall hernias.  No succession splash. Neurologic:  Alert and  oriented x4;  grossly nonfocal Skin:  Intact without significant lesions or rashes. Psych:  Alert and cooperative. Normal mood and affect.   Zoraida Havrilla L. Tarri Glenn, MD, MPH [facility] 07/05/2020, 2:00 PM

## 2020-07-05 NOTE — Patient Instructions (Addendum)
ENDOSCOPY: You have been scheduled for an endoscopy. Please follow written instructions given to you at your visit today.  INHALERS: If you use inhalers (even only as needed), please bring them with you on the day of your procedure.  OVER THE COUNTER MEDICATIONS: I recommend that you start taking over the counter sucrase enzymes in addition to the measures that you already take to avoid dairy.  PRESCRIPTION MEDICATION(S): We have sent the following medication(s) to your pharmacy:  . Colestid - at your request, we have sent a 90 day supply to your pharmacy  We will proceed with a stool test looking for infections.   LABS: Your provider has requested that you go to the basement level for lab work before leaving today. Press "B" on the elevator. The lab is located at the first door on the left as you exit the elevator.  HEALTHCARE LAWS AND MY CHART RESULTS: Due to recent changes in healthcare laws, you may see the results of your imaging and laboratory studies on MyChart before your provider has had a chance to review them.  We understand that in some cases there may be results that are confusing or concerning to you. Not all laboratory results come back in the same time frame and the provider may be waiting for multiple results in order to interpret others.  Please give [place] 48 hours in order for your provider to thoroughly review all the results before contacting the office for clarification of your results.   I recommend that you eliminate all table sugar, beet sugar, brown sugar, cane sugar, caramel sugar, coconut sugar, confectioner's sugar, date sugar, and raw sugar from your diet.  I find that working with a nutritionist can make it easier to make these changes. Let's see if we can find a nutritionist that if more helpful to you.   Consider adding a probiotic if you do not respond to the above recommendations.  I would like to see you in 2-3 months, earlier if needed.   REFERRAL:  At your  request, we did not send a referral to the nutritionist. You have requested to call our office with another name covered by your insurance.   I would be happy to send you to [place], [place], or [place] at any time if you would like a second opinion.   If you are age 65 or younger, your body mass index should be between 19-25. Your There is no height or weight on file to calculate BMI. If this is out of the aformentioned range listed, please consider follow up with your Primary Care Provider.   Thank you for trusting me with your gastrointestinal care!    Thornton Park, MD, MPH

## 2020-07-06 ENCOUNTER — Other Ambulatory Visit: Payer: PPO

## 2020-07-06 DIAGNOSIS — R197 Diarrhea, unspecified: Secondary | ICD-10-CM | POA: Diagnosis not present

## 2020-07-10 ENCOUNTER — Other Ambulatory Visit: Payer: Self-pay | Admitting: Critical Care Medicine

## 2020-07-10 ENCOUNTER — Other Ambulatory Visit: Payer: Self-pay | Admitting: Psychiatry

## 2020-07-10 ENCOUNTER — Other Ambulatory Visit: Payer: Self-pay | Admitting: Gastroenterology

## 2020-07-10 DIAGNOSIS — E739 Lactose intolerance, unspecified: Secondary | ICD-10-CM

## 2020-07-10 DIAGNOSIS — K219 Gastro-esophageal reflux disease without esophagitis: Secondary | ICD-10-CM

## 2020-07-10 DIAGNOSIS — R1084 Generalized abdominal pain: Secondary | ICD-10-CM

## 2020-07-10 DIAGNOSIS — R197 Diarrhea, unspecified: Secondary | ICD-10-CM

## 2020-07-10 DIAGNOSIS — R140 Abdominal distension (gaseous): Secondary | ICD-10-CM

## 2020-07-10 DIAGNOSIS — K6389 Other specified diseases of intestine: Secondary | ICD-10-CM

## 2020-07-10 DIAGNOSIS — F4001 Agoraphobia with panic disorder: Secondary | ICD-10-CM

## 2020-07-10 LAB — GI PROFILE, STOOL, PCR

## 2020-07-21 ENCOUNTER — Encounter: Payer: PPO | Admitting: Gastroenterology

## 2020-07-26 ENCOUNTER — Encounter: Payer: Self-pay | Admitting: Pulmonary Disease

## 2020-07-26 ENCOUNTER — Ambulatory Visit: Payer: PPO | Admitting: Pulmonary Disease

## 2020-07-26 ENCOUNTER — Other Ambulatory Visit: Payer: Self-pay

## 2020-07-26 VITALS — BP 116/74 | HR 78 | Temp 97.2°F | Ht 61.5 in | Wt 178.0 lb

## 2020-07-26 DIAGNOSIS — R0609 Other forms of dyspnea: Secondary | ICD-10-CM | POA: Diagnosis not present

## 2020-07-26 DIAGNOSIS — R0683 Snoring: Secondary | ICD-10-CM | POA: Diagnosis not present

## 2020-07-26 DIAGNOSIS — J4540 Moderate persistent asthma, uncomplicated: Secondary | ICD-10-CM

## 2020-07-26 MED ORDER — AZELASTINE HCL 0.1 % NA SOLN: 1.0000 | Freq: Two times a day (BID) | NASAL | 12 refills | Status: DC

## 2020-07-26 NOTE — Patient Instructions (Addendum)
Stop using Flonase for 2 weeks to let the nose bleeding heal  Start using azelastine nasal spray   Continue Symbicort twice daily and albuterol as needed.   Continue zyrtec and Singulair daily  Follow up breathing tests and office visit in 3 months

## 2020-07-26 NOTE — Progress Notes (Signed)
Synopsis: Referred in 2018 for asthma by Brittany Macadam, MD.  Previously patient of Dr. Melvyn [name] and Dr. Gwenette [name].   Subjective:   PATIENT ID: Brittany [name] GENDER: female DOB: [birth year], MRN: [id number]  Chief Complaint  Patient presents with  . Follow-up    3 month f/u for asthma. States her asthma has under control but has a productive cough. Most of the time the phelgm is clear but has noticed some greenish tint.      Ms. Brittany [name] is a 65 year old woman with a history of asthma who presents for follow-up.    She has done well since last visit. She is concerned about the fibrosis noted on her HRCT in 10/21. She denies any issues with dyspnea or wheezing at this time. She does have a cough that she reports is coming from her throat. She continues to have sinus congestion and drainage. She complains of some intermittent epistaxis from her left nostril.  She is on Symbicort twice daily, albuterol only as needed.   OV 03/04/20 Ms. Brittany [name] is a 65 year old woman with a history of asthma who presents for follow-up.  She recently had an office visit on 02/11/2019 on with NP Parrett (note reviewed).  At that time her asthma was well controlled.  She was on Symbicort twice daily, albuterol only as needed.  She was using saline sinus rinses and Afrin for short period due to upper airway cough syndrome from postnasal drip.  She reports that she notices allergy symptoms, and these are her baseline.  She has been afraid to exercise since having an episode of lightheadedness when getting off the treadmill about a month ago.  She felt like she had overdone it by doing her exercises in bed without resting before getting on the treadmill.  She did not require albuterol to control her breathing, rather focused on slowing her breathing consciously.  She wants to know if she can safely resume exercise.  Humidity seems to make her symptoms worse, so she prefers to exercise on a treadmill.  OV 09/03/19: Ms.  Brittany [name] is a 65 y/o woman following up for asthma and allergic rhinitis. She had an exacerbation of her asthma with persistent symptoms following Covid pneumonia in 2020.  Her symptoms have continued to improve.  Her allergies have been worse than normal recently due to pollen, but she is overall breathing better. Her asthma is better controlled-her cough and shortness of breath are both improved.  She is needing albuterol about once per month, less and less over time.  She sometimes feels this though she needs it during exercise.  She is continued on Symbicort twice daily, Singulair, Zyrtec, Flonase.  She was unable to afford azelastine nasal spray.  She has some ongoing throat congestion hoarseness after talking for a while that persists since she had Covid.   OV 06/17/2019: Mrs. Brittany [name] is a 65 year old woman with a history of asthma who presents for follow-up.  Her fevers she was having at her last visit resolved after a course of doxycycline.  She has been feeling well.  She has been off her oxygen for 8 days now, and has been monitoring her saturations at home.  Her dyspnea on exertion has been slowly improving, and she is getting faster and more independent with her ADLs.  For the past 2 days she is no longer needed to sit down while showering.  At her last visit we escalate her therapy from Flovent to [facility] twice daily.  She continues on  Singulair, Zyrtec, and Flonase.  Overall she is doing well without wheezing or dyspnea on exertion.  Her cough is less than it was at her baseline prior to Covid.  She has had some yellow sputum for the last few days, but is not having more sputum than her usual or coughing more frequently.  Her baseline sputum is clear.  She has some ongoing allergies and postnasal drip with more rhinorrhea and sneezing the last few days.  She denies fever, chills, sweats.  She has pet cats, which she does not want to get rid of.  She knows she is allergic to them.  She continues to  allow them to sleep in her bed.  She is to air purifiers in her house.  She is continue to avoid tobacco since quitting 30 years ago.    televisit 05/20/2019: Mrs. Brittany [name] is a 65 year old woman who presents for follow-up.  She has a history of lifelong asthma.  She is recovering from recent COVID-19 viral pneumonia.  She was hospitalized at [facility] from November 3 to November 14.  She was discharged on home oxygen, which she has been using regularly.  She continues to use it at night, but is able to stay off of it sometimes during the day.  Currently her saturations are 93%.  While hospitalized she received steroids, remdesivir, convalescent plasma and Tocilizumab.  She was discharged on Eliquis for her high D-dimer, but did not have a confirmed DVT.  Her primary care physician is following her D-dimer to determine the appropriate time to stop Eliquis.  Since discharge she has had periodic epistaxis, which she has been managing with Neosporin on the inside her nose.  Her asthma was not fully controlled prior to her hospitalization, and it has been more noticeable since discharge.  It is improving; she has not used in a nebulizer at all today.  At first when she came home she was using them 3 times a day, was able to go down to twice a day.  She has had a fever since yesterday, and thinks that she may have a sinus infection.  She was recently prescribed doxycycline for small bowel bacterial overgrowth, but she is hoping this will help her sinuses.  She has chronic allergies to dust, mold, and cats.  She refuses to give up her cats.  She follows with Dr. Fredderick [name] from allergy and immunology.  She has care filters in her house to help with her allergies.  She is on Flovent twice daily with albuterol nebulizers.  She has been on Symbicort in the past, but had trouble affording this medication.  She has wheezing and shortness of breath.  Exercise triggers her asthma symptoms.      Flowsheet data:  Lab  Results  Component Value Date   NITRICOXIDE 14 07/16/2016    Past Medical History:  Diagnosis Date  . Abnormal EKG   . Anxiety   . Asthma   . Bipolar depression ([place]), Anxiety, PTSD, Panic disorder    -managed by [facility]  . Depression   . Foot pain   . GERD (gastroesophageal reflux disease)   . Heart murmur    as a child  . Insomnia   . Leg swelling   . Osteoporosis   . Tachycardia      Family History  Problem Relation Age of Onset  . Heart failure Mother   . Hypertension Mother   . Hyperlipidemia Mother   . Congestive Heart Failure Mother   .  Other Father        killed  . COPD Paternal 25        a lot of aunts and uncle COPD or Emphysema  . Emphysema Paternal Uncle   . Colon polyps Maternal Aunt   . COPD Brother   . Heart disease Brother   . Pulmonary embolism Brother   . Breast cancer Neg Hx   . Colon cancer Neg Hx   . Esophageal cancer Neg Hx   . Pancreatic cancer Neg Hx   . Stomach cancer Neg Hx   . Liver disease Neg Hx      Past Surgical History:  Procedure Laterality Date  . BREAST BIOPSY Right    biopsy of nipple  . CARPAL TUNNEL RELEASE Left   . CESAREAN SECTION     x 2  . CHOLECYSTECTOMY    . ENDOMETRIAL ABLATION    . KNEE SURGERY Right    reconstruction for patellar dislocation  . LAPAROSCOPY     x 2  . TUBAL LIGATION    . ULNAR NERVE REPAIR Left     Social History   Socioeconomic History  . Marital status: Single    Spouse name: Not on file  . Number of children: 3  . Years of education: Not on file  . Highest education level: Not on file  Occupational History  . Occupation: disabled  Tobacco Use  . Smoking status: Former Smoker    Packs/day: 20.00    Years: 1.00    Pack years: 20.00    Quit date: 06/19/1999    Years since quitting: 21.1  . Smokeless tobacco: Never Used  . Tobacco comment: 20 year estimate but probably less   Vaping Use  . Vaping Use: Never used  Substance and Sexual Activity  . Alcohol use:  Yes    Alcohol/week: 0.0 standard drinks    Comment: Occass.   . Drug use: No  . Sexual activity: Never  Other Topics Concern  . Not on file  Social History Narrative   Work or School: Disabled seconndary to psychiatric conditions      Home Situation: lives alone with 2 cats and one dog      Spiritual Beliefs: Christian      Lifestyle: no regular exercise, diet  Not great - wants to embark on healthier lifestyle   Social Determinants of Health   Financial Resource Strain: Medium Risk  . Difficulty of Paying Living Expenses: Somewhat hard  Food Insecurity: No Food Insecurity  . Worried About Charity fundraiser in the Last Year: Never true  . Ran Out of Food in the Last Year: Never true  Transportation Needs: No Transportation Needs  . Lack of Transportation (Medical): No  . Lack of Transportation (Non-Medical): No  Physical Activity: Inactive  . Days of Exercise per Week: 0 days  . Minutes of Exercise per Session: 0 min  Stress: Stress Concern Present  . Feeling of Stress : To some extent  Social Connections: Moderately Isolated  . Frequency of Communication with Friends and Family: Twice a week  . Frequency of Social Gatherings with Friends and Family: Once a week  . Attends Religious Services: 1 to 4 times per year  . Active Member of Clubs or Organizations: No  . Attends Archivist Meetings: Never  . Marital Status: Never married  Intimate Partner Violence: Not At Risk  . Fear of Current or Ex-Partner: No  . Emotionally Abused: No  . Physically Abused:  No  . Sexually Abused: No     Allergies  Allergen Reactions  . Aspirin Other (See Comments)    discomfort  . Codeine Itching  . Sulfamethoxazole-Trimethoprim Other (See Comments)    Unknown reaction per pt  . Trileptal [Oxcarbazepine]     itching  . Trimethoprim Other (See Comments)    Unknown, per pt  . Ultram [Tramadol] Itching  . Vioxx [Rofecoxib]     Unknown per pt     Immunization History   Administered Date(s) Administered  . Influenza Split 04/26/2011  . Influenza Whole 03/18/2009, 03/18/2010, 03/18/2016  . Influenza,inj,Quad PF,6+ Mos 04/19/2014, 02/28/2017, 03/06/2018, 02/13/2019, 03/04/2020  . PFIZER(Purple Top)SARS-COV-2 Vaccination 09/04/2019, 09/29/2019, 04/06/2020  . Pneumococcal Conjugate-13 03/10/2015  . Pneumococcal Polysaccharide-23 06/18/2005  . Tdap 12/04/2016  . Zoster Recombinat (Shingrix) 01/01/2018, 06/22/2018    Outpatient Medications Prior to Visit  Medication Sig Dispense Refill  . albuterol (PROVENTIL) (2.5 MG/3ML) 0.083% nebulizer solution Take 3 mLs (2.5 mg total) by nebulization every 6 (six) hours as needed for wheezing or shortness of breath. 75 mL 12  . Albuterol Sulfate (PROAIR RESPICLICK) 673 (90 Base) MCG/ACT AEPB Inhale 2 puffs into the lungs every 6 (six) hours as needed. 1 each 1  . budesonide-formoterol (SYMBICORT) 160-4.5 MCG/ACT inhaler Inhale 2 puffs into the lungs 2 (two) times daily. 1 Inhaler 11  . busPIRone (BUSPAR) 15 MG tablet TAKE ONE TABLET BY MOUTH THREE TIMES A DAY 270 tablet 0  . cetirizine (ZYRTEC) 10 MG tablet Take 1 tablet (10 mg total) by mouth daily. 90 tablet 1  . Cholecalciferol (VITAMIN D-3 PO) Take 10,000 Units by mouth daily.     . colestipol (COLESTID) 1 g tablet Take 1 tablet (1 g total) by mouth 2 (two) times daily. 180 tablet 3  . diazepam (VALIUM) 5 MG tablet TAKE ONE TABLET BY MOUTH EVERY 6 HOURS AS NEEDED FOR ANXIETY 120 tablet 5  . dicyclomine (BENTYL) 20 MG tablet TAKE ONE TABLET BY MOUTH FOUR TIMES A DAY AS NEEDED FOR SPASMS 120 tablet 3  . doxazosin (CARDURA) 4 MG tablet TAKE TWO TABLETS BY MOUTH EVERY NIGHT AT BEDTIME (Patient taking differently: Take 4 mg by mouth at bedtime.) 180 tablet 1  . famotidine (PEPCID) 20 MG tablet TAKE ONE TABLET BY MOUTH TWICE A DAY 180 tablet 3  . ferrous sulfate 325 (65 FE) MG EC tablet Take 325 mg by mouth 2 (two) times a week.    . fluticasone (FLONASE) 50 MCG/ACT nasal  spray SPRAY TWO SPRAYS IN EACH NOSTRIL TWICE DAILY 96 mL 2  . mirtazapine (REMERON) 15 MG tablet TAKE ONE TABLET BY MOUTH EVERY NIGHT AT BEDTIME 90 tablet 0  . montelukast (SINGULAIR) 10 MG tablet TAKE ONE TABLET BY MOUTH AT BEDTIME 90 tablet 1  . Multiple Vitamin (MULTIVITAMIN WITH MINERALS) TABS tablet Take 1 tablet by mouth daily.    Marland Kitchen omega-3 acid ethyl esters (LOVAZA) 1 g capsule Take by mouth daily.    . pantoprazole (PROTONIX) 40 MG tablet Take 1 tablet (40 mg total) by mouth 2 (two) times daily. 180 tablet 3  . topiramate (TOPAMAX) 100 MG tablet Take 1 tablet (100 mg total) by mouth daily. 30 tablet 1  . Biotin 1 MG CAPS Take by mouth. (Patient not taking: Reported on 07/05/2020)     No facility-administered medications prior to visit.    Review of Systems  Constitutional: Negative for chills, diaphoresis, fever, malaise/fatigue and weight loss.  HENT: Positive for congestion and nosebleeds.  Eyes: Negative.   Respiratory: Positive for cough. Negative for shortness of breath and wheezing.   Cardiovascular: Negative for chest pain, palpitations, orthopnea, claudication and leg swelling.  Gastrointestinal: Negative for heartburn and nausea.  Genitourinary: Negative.   Musculoskeletal: Negative for joint pain and myalgias.  Neurological: Negative for dizziness, weakness and headaches.  Psychiatric/Behavioral: Negative.     Objective:   Vitals:   07/26/20 1005  BP: 116/74  Pulse: 78  Temp: (!) 97.2 F (36.2 C)  TempSrc: Temporal  SpO2: 98%  Weight: 178 lb (80.7 kg)  Height: 5' 1.5" (1.562 m)   98% on  RA BMI Readings from Last 3 Encounters:  07/26/20 33.09 kg/m  07/05/20 33.13 kg/m  05/25/20 33.85 kg/m   Wt Readings from Last 3 Encounters:  07/26/20 178 lb (80.7 kg)  07/05/20 178 lb 4 oz (80.9 kg)  05/25/20 182 lb 2 oz (82.6 kg)    Physical Exam Vitals reviewed.  Constitutional:      General: She is not in acute distress.    Appearance: She is obese. She  is not ill-appearing.  HENT:     Head: Normocephalic and atraumatic.  Eyes:     General: No scleral icterus. Cardiovascular:     Rate and Rhythm: Normal rate and regular rhythm.     Heart sounds: No murmur heard.   Pulmonary:     Effort: Pulmonary effort is normal.     Breath sounds: Normal breath sounds. No wheezing, rhonchi or rales.  Abdominal:     General: There is no distension.     Palpations: Abdomen is soft.  Musculoskeletal:        General: No swelling or deformity.     Cervical back: Neck supple.  Lymphadenopathy:     Cervical: No cervical adenopathy.  Skin:    General: Skin is warm and dry.     Findings: No rash.  Neurological:     General: No focal deficit present.     Mental Status: She is alert.     Coordination: Coordination normal.  Psychiatric:        Mood and Affect: Mood normal.        Behavior: Behavior normal.    CBC    Component Value Date/Time   WBC 4.7 04/06/2020 0916   RBC 4.83 04/06/2020 0916   HGB 15.3 04/06/2020 0916   HCT 45.7 (H) 04/06/2020 0916   PLT 273 04/06/2020 0916   MCV 94.6 04/06/2020 0916   MCH 31.7 04/06/2020 0916   MCHC 33.5 04/06/2020 0916   RDW 12.8 04/06/2020 0916   LYMPHSABS 855 04/06/2020 0916   MONOABS 0.5 02/11/2020 1237   EOSABS 42 04/06/2020 0916   BASOSABS 52 04/06/2020 0916   Chest Imaging- films reviewed: HRCT Chest 03/2020  Moderate patchy ground glass opacity, reticulation and septal thickening throughout both lungs with associated mild traction bronchiectasis and arcitectural distortion, with an upper lobe predominance, asymmetrically prominent on the left. No frank honeycombing. Previously noted acute opacities on 04/22/2019 chest CTA have improved/resolved while the fibrotic components are new.   CXR, 2 view 05/12/2019-left greater than right opacities and reticulation.  Possible posterior pleural effusion silhouetting hemidiaphragm  CTA chest 04/22/2019-left greater than right peripheral groundglass  opacities.  No PE.  CXR 02/11/2020-increased interstitial markings throughout  Echocardiogram 01/26/20: LVEF 60 to 65%, no regional wall motion abnormalities.  Mild concentric LVH, moderate basal septal hypertrophy.  Normal LA.  RV with mildly reduced function, normal size.  Normal RA.  Normal valves.  Pulmonary Functions Testing Results: PFT Results Latest Ref Rng & Units 09/03/2019  FVC-Pre L 2.55  FVC-Predicted Pre % 86  FVC-Post L 2.71  FVC-Predicted Post % 91  Pre FEV1/FVC % % 70  Post FEV1/FCV % % 67  FEV1-Pre L 1.78  FEV1-Predicted Pre % 78  FEV1-Post L 1.83  DLCO uncorrected ml/min/mmHg 16.97  DLCO UNC% % 91  DLCO corrected ml/min/mmHg 16.97  DLCO COR %Predicted % 91  DLVA Predicted % 98  TLC L 4.48  TLC % Predicted % 94  RV % Predicted % 99   2021- no significant obstruction or bronchodilator reversibility.  No restriction, hyperinflation, or air trapping.  Normal diffusion.  Assessment & Plan:     ICD-10-CM   1. Moderate persistent asthma without complication  J09.32 Pulmonary Function Test  2. Snoring  R06.83   3. Chronic dyspnea  R06.09 Pulmonary Function Test   Asthma -Continue Symbicort twice daily.  Continue rinse her mouth after every use. 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 Spoke with the patient and daughter at length that these fibrotic changes are likely from Covid and we will monitor for any concerns of disease progression but this will not likely follow the path of UIP or true pulmonary fibrosis. - PFTs without restrictive or diffsuion defects in 08/2019  Snoring - Concern for underlying obstructive sleep apnea, home sleep study on hold for now until other medical bills are paid  Allergic rhinosinusitis -Continue Zyrtec - Hold flonase for 2 weeks due to nose bleeds and nasal irritation.  - Start azelastine nasal spray  Follow up in 3 months with PFTs   Current Outpatient Medications:   .  albuterol (PROVENTIL) (2.5 MG/3ML) 0.083% nebulizer solution, Take 3 mLs (2.5 mg total) by nebulization every 6 (six) hours as needed for wheezing or shortness of breath., Disp: 75 mL, Rfl: 12 .  Albuterol Sulfate (PROAIR RESPICLICK) 671 (90 Base) MCG/ACT AEPB, Inhale 2 puffs into the lungs every 6 (six) hours as needed., Disp: 1 each, Rfl: 1 .  budesonide-formoterol (SYMBICORT) 160-4.5 MCG/ACT inhaler, Inhale 2 puffs into the lungs 2 (two) times daily., Disp: 1 Inhaler, Rfl: 11 .  busPIRone (BUSPAR) 15 MG tablet, TAKE ONE TABLET BY MOUTH THREE TIMES A DAY, Disp: 270 tablet, Rfl: 0 .  cetirizine (ZYRTEC) 10 MG tablet, Take 1 tablet (10 mg total) by mouth daily., Disp: 90 tablet, Rfl: 1 .  Cholecalciferol (VITAMIN D-3 PO), Take 10,000 Units by mouth daily. , Disp: , Rfl:  .  colestipol (COLESTID) 1 g tablet, Take 1 tablet (1 g total) by mouth 2 (two) times daily., Disp: 180 tablet, Rfl: 3 .  diazepam (VALIUM) 5 MG tablet, TAKE ONE TABLET BY MOUTH EVERY 6 HOURS AS NEEDED FOR ANXIETY, Disp: 120 tablet, Rfl: 5 .  dicyclomine (BENTYL) 20 MG tablet, TAKE ONE TABLET BY MOUTH FOUR TIMES A DAY AS NEEDED FOR SPASMS, Disp: 120 tablet, Rfl: 3 .  doxazosin (CARDURA) 4 MG tablet, TAKE TWO TABLETS BY MOUTH EVERY NIGHT AT BEDTIME (Patient taking differently: Take 4 mg by mouth at bedtime.), Disp: 180 tablet, Rfl: 1 .  famotidine (PEPCID) 20 MG tablet, TAKE ONE TABLET BY MOUTH TWICE A DAY, Disp: 180 tablet, Rfl: 3 .  ferrous sulfate 325 (65 FE) MG EC tablet, Take 325 mg by mouth 2 (two) times a week., Disp: , Rfl:  .  fluticasone (FLONASE) 50 MCG/ACT nasal spray, SPRAY TWO SPRAYS IN EACH NOSTRIL TWICE  DAILY, Disp: 96 mL, Rfl: 2 .  mirtazapine (REMERON) 15 MG tablet, TAKE ONE TABLET BY MOUTH EVERY NIGHT AT BEDTIME, Disp: 90 tablet, Rfl: 0 .  montelukast (SINGULAIR) 10 MG tablet, TAKE ONE TABLET BY MOUTH AT BEDTIME, Disp: 90 tablet, Rfl: 1 .  Multiple Vitamin (MULTIVITAMIN WITH MINERALS) TABS tablet, Take 1 tablet  by mouth daily., Disp: , Rfl:  .  omega-3 acid ethyl esters (LOVAZA) 1 g capsule, Take by mouth daily., Disp: , Rfl:  .  pantoprazole (PROTONIX) 40 MG tablet, Take 1 tablet (40 mg total) by mouth 2 (two) times daily., Disp: 180 tablet, Rfl: 3 .  topiramate (TOPAMAX) 100 MG tablet, Take 1 tablet (100 mg total) by mouth daily., Disp: 30 tablet, Rfl: 1   Freddi Starr, MD [facility] 07/26/2020 11:01 AM

## 2020-08-08 ENCOUNTER — Encounter: Payer: Self-pay | Admitting: Gastroenterology

## 2020-08-08 ENCOUNTER — Ambulatory Visit (AMBULATORY_SURGERY_CENTER): Payer: PPO | Admitting: Gastroenterology

## 2020-08-08 ENCOUNTER — Other Ambulatory Visit: Payer: Self-pay

## 2020-08-08 VITALS — BP 131/69 | HR 76 | Temp 97.1°F | Resp 15 | Ht 61.5 in | Wt 178.0 lb

## 2020-08-08 DIAGNOSIS — K219 Gastro-esophageal reflux disease without esophagitis: Secondary | ICD-10-CM

## 2020-08-08 DIAGNOSIS — R634 Abnormal weight loss: Secondary | ICD-10-CM

## 2020-08-08 DIAGNOSIS — K317 Polyp of stomach and duodenum: Secondary | ICD-10-CM

## 2020-08-08 DIAGNOSIS — R197 Diarrhea, unspecified: Secondary | ICD-10-CM | POA: Diagnosis not present

## 2020-08-08 DIAGNOSIS — F4310 Post-traumatic stress disorder, unspecified: Secondary | ICD-10-CM | POA: Diagnosis not present

## 2020-08-08 DIAGNOSIS — R140 Abdominal distension (gaseous): Secondary | ICD-10-CM | POA: Diagnosis not present

## 2020-08-08 DIAGNOSIS — J45909 Unspecified asthma, uncomplicated: Secondary | ICD-10-CM | POA: Diagnosis not present

## 2020-08-08 DIAGNOSIS — D132 Benign neoplasm of duodenum: Secondary | ICD-10-CM | POA: Diagnosis not present

## 2020-08-08 DIAGNOSIS — F319 Bipolar disorder, unspecified: Secondary | ICD-10-CM | POA: Diagnosis not present

## 2020-08-08 MED ORDER — PANTOPRAZOLE SODIUM 40 MG PO TBEC: 40.0000 mg | DELAYED_RELEASE_TABLET | Freq: Two times a day (BID) | ORAL | 3 refills | Status: DC

## 2020-08-08 MED ORDER — SODIUM CHLORIDE 0.9 % IV SOLN: 500.0000 mL | Freq: Once | INTRAVENOUS | Status: DC

## 2020-08-08 NOTE — Progress Notes (Signed)
Report to PACU, RN, vss, BBS= Clear.  

## 2020-08-08 NOTE — Patient Instructions (Signed)
Information on gastritis given to you today.  Await pathology results.  Resume previous diet and medications.  YOU HAD AN ENDOSCOPIC PROCEDURE TODAY AT THE [facility]:   Refer to the procedure report that was given to you for any specific questions about what was found during the examination.  If the procedure report does not answer your questions, please call your gastroenterologist to clarify.  If you requested that your care partner not be given the details of your procedure findings, then the procedure report has been included in a sealed envelope for you to review at your convenience later.  YOU SHOULD EXPECT: Some feelings of bloating in the abdomen. Passage of more gas than usual.  Walking can help get rid of the air that was put into your GI tract during the procedure and reduce the bloating. If you had a lower endoscopy (such as a colonoscopy or flexible sigmoidoscopy) you may notice spotting of blood in your stool or on the toilet paper. If you underwent a bowel prep for your procedure, you may not have a normal bowel movement for a few days.  Please Note:  You might notice some irritation and congestion in your nose or some drainage.  This is from the oxygen used during your procedure.  There is no need for concern and it should clear up in a day or so.  SYMPTOMS TO REPORT IMMEDIATELY:  Following upper endoscopy (EGD)  Vomiting of blood or coffee ground material  New chest pain or pain under the shoulder blades  Painful or persistently difficult swallowing  New shortness of breath  Fever of 100F or higher  Black, tarry-looking stools  For urgent or emergent issues, a gastroenterologist can be reached at any hour by calling [phone]. Do not use MyChart messaging for urgent concerns.    DIET:  We do recommend a small meal at first, but then you may proceed to your regular diet.  Drink plenty of fluids but you should avoid alcoholic beverages for 24  hours.  ACTIVITY:  You should plan to take it easy for the rest of today and you should NOT DRIVE or use heavy machinery until tomorrow (because of the sedation medicines used during the test).    FOLLOW UP: Our staff will call the number listed on your records 48-72 hours following your procedure to check on you and address any questions or concerns that you may have regarding the information given to you following your procedure. If we do not reach you, we will leave a message.  We will attempt to reach you two times.  During this call, we will ask if you have developed any symptoms of COVID 19. If you develop any symptoms (ie: fever, flu-like symptoms, shortness of breath, cough etc.) before then, please call [phone].  If you test positive for Covid 19 in the 2 weeks post procedure, please call and report this information to us.    If any biopsies were taken you will be contacted by phone or by letter within the next 1-3 weeks.  Please call us at [phone] if you have not heard about the biopsies in 3 weeks.    SIGNATURES/CONFIDENTIALITY: You and/or your care partner have signed paperwork which will be entered into your electronic medical record.  These signatures attest to the fact that that the information above on your After Visit Summary has been reviewed and is understood.  Full responsibility of the confidentiality of this discharge information lies with you   with you and/or your care-partner.

## 2020-08-08 NOTE — Progress Notes (Signed)
JB - Check-in   SB - VS

## 2020-08-08 NOTE — Progress Notes (Signed)
Called to room to assist during endoscopic procedure.  Patient ID and intended procedure confirmed with present staff. Received instructions for my participation in the procedure from the performing physician.  

## 2020-08-08 NOTE — Op Note (Signed)
[facility] Patient Name: Brittany [name] Procedure Date: 08/08/2020 9:58 AM MRN: [id number] Endoscopist: Thornton Park MD, MD Age: 65 Referring MD:  Date of Birth: 08/08/1955 Gender: Female Account #: [id number] Procedure:                Upper GI endoscopy Indications:              Abdominal pain, Abdominal bloating, Nausea Medicines:                Monitored Anesthesia Care Procedure:                Pre-Anesthesia Assessment:                           - Prior to the procedure, a History and Physical                            was performed, and patient medications and                            allergies were reviewed. The patient's tolerance of                            previous anesthesia was also reviewed. The risks                            and benefits of the procedure and the sedation                            options and risks were discussed with the patient.                            All questions were answered, and informed consent                            was obtained. Prior Anticoagulants: The patient has                            taken no previous anticoagulant or antiplatelet                            agents. ASA Grade Assessment: III - A patient with                            severe systemic disease. After reviewing the risks                            and benefits, the patient was deemed in                            satisfactory condition to undergo the procedure.                           After obtaining informed consent, the endoscope was  passed under direct vision. Throughout the                            procedure, the patient's blood pressure, pulse, and                            oxygen saturations were monitored continuously. The                            Endoscope was introduced through the mouth, and                            advanced to the third part of duodenum. The upper                            GI  endoscopy was accomplished without difficulty.                            The patient tolerated the procedure well. Scope In: Scope Out: Findings:                 The esophagus was normal. The z-line is located                            35cm from the incisors.                           Patchy very mildly erythematous mucosa without                            bleeding was found in the gastric body. Biopsies                            were taken from the antrum, body, and fundus with a                            cold forceps for histology. Estimated blood loss                            was minimal.                           A single less than 1 mm sessile polyp was found in                            the duodenal bulb. The polyp was removed with a                            cold biopsy forceps. Resection and retrieval were                            complete. Estimated blood loss was minimal.  The examined duodenum was normal. Biopsies were                            taken with a cold forceps for histology. Estimated                            blood loss was minimal.                           The cardia and gastric fundus were normal on                            retroflexion. Complications:            No immediate complications. Estimated blood loss:                            Minimal. Estimated Blood Loss:     Estimated blood loss was minimal. Impression:               - Normal esophagus.                           - Erythematous mucosa in the gastric body. Biopsied.                           - A single duodenal polyp. Resected and retrieved.                           - Normal examined duodenum. Biopsied. Recommendation:           - Patient has a contact number available for                            emergencies. The signs and symptoms of potential                            delayed complications were discussed with the                            patient.  Return to normal activities tomorrow.                            Written discharge instructions were provided to the                            patient.                           - Resume previous diet.                           - Continue present medications. I did not make any                            medication changes today.                           -  Await pathology results.                           - Consider trial of TCA if biopsies are negative. Thornton Park MD, MD 08/08/2020 10:27:28 AM This report has been signed electronically.

## 2020-08-09 ENCOUNTER — Telehealth: Payer: Self-pay | Admitting: Psychiatry

## 2020-08-09 NOTE — Telephone Encounter (Signed)
Please review message about tapering off of Topamax.   The issue with a letter about backing up to park I believe you declined doing that?

## 2020-08-09 NOTE — Telephone Encounter (Signed)
Tell patient to cut Topamax in half for 2 weeks and then stop it per her request

## 2020-08-09 NOTE — Telephone Encounter (Signed)
Pt called and said that after having an endoscopy for her stomach issues. She has gastitius, She would like to wean off the topiramate to see if that will help. Also, he is waiting on a letter that is to be written to her about being able to back into her parking spot due to her anxiety. She would like it mailed to her. Please call her at [phone]

## 2020-08-10 ENCOUNTER — Telehealth: Payer: Self-pay | Admitting: *Deleted

## 2020-08-10 NOTE — Telephone Encounter (Signed)
Pt has been called and informed.  

## 2020-08-10 NOTE — Telephone Encounter (Signed)
Chart entered in error

## 2020-08-10 NOTE — Telephone Encounter (Signed)
  Follow up Call-  Call back number 08/08/2020 04/09/2018  Post procedure Call Back phone  # [phone] cell [phone]  Permission to leave phone message Yes Yes  Some recent data might be hidden     Patient questions:  Do you have a fever, pain , or abdominal swelling? Yes.   Pain Score  4 * still with abdominal pain same as prior to EGD.  Have you tolerated food without any problems? Yes.    Have you been able to return to your normal activities? Yes.    Do you have any questions about your discharge instructions: Diet   No. Medications  No. Follow up visit  No.  Do you have questions or concerns about your Care? No.  Actions: * If pain score is 4 or above: No action needed, pain <4.   1. Have you developed a fever since your procedure? no  2.   Have you had an respiratory symptoms (SOB or cough) since your procedure? no  3.   Have you tested positive for COVID 19 since your procedure no  4.   Have you had any family members/close contacts diagnosed with the COVID 19 since your procedure?  no   If yes to any of these questions please route to Joylene John, RN and Joella Prince, RN

## 2020-08-11 ENCOUNTER — Telehealth: Payer: Self-pay | Admitting: Gastroenterology

## 2020-08-11 ENCOUNTER — Other Ambulatory Visit: Payer: Self-pay

## 2020-08-11 DIAGNOSIS — R197 Diarrhea, unspecified: Secondary | ICD-10-CM

## 2020-08-11 DIAGNOSIS — R140 Abdominal distension (gaseous): Secondary | ICD-10-CM

## 2020-08-11 DIAGNOSIS — K219 Gastro-esophageal reflux disease without esophagitis: Secondary | ICD-10-CM

## 2020-08-11 DIAGNOSIS — E7431 Sucrase-isomaltase deficiency: Secondary | ICD-10-CM

## 2020-08-11 MED ORDER — COLESTIPOL HCL 1 G PO TABS: 1.0000 g | ORAL_TABLET | Freq: Two times a day (BID) | ORAL | 3 refills | Status: DC

## 2020-08-11 NOTE — Telephone Encounter (Signed)
Patient called requesting we resend the Colestid medication to another pharmacy for Brittany [name] does not have it in stock. She said for the new pharmacy called [facility] they need a verbal script tel: [phone] option [phone] fax: [phone] for her to get the medication as soon as possible.

## 2020-08-11 NOTE — Telephone Encounter (Signed)
Outpatient Medication Detail   Disp Refills Start End   colestipol (COLESTID) 1 g tablet 180 tablet 3 08/11/2020    Sig - Route: Take 1 tablet (1 g total) by mouth 2 (two) times daily. - Oral   Sent to pharmacy as: colestipol (COLESTID) 1 g tablet   E-Prescribing Status: Receipt confirmed by pharmacy (08/11/2020 3:15 PM EST)    Pharmacy  [facility]) - [address], [address]  [place], [place] [address]  Phone:  [phone] Fax:  [phone]

## 2020-08-12 ENCOUNTER — Other Ambulatory Visit: Payer: Self-pay

## 2020-08-12 DIAGNOSIS — R197 Diarrhea, unspecified: Secondary | ICD-10-CM

## 2020-08-12 MED ORDER — COLESTIPOL HCL 1 G PO TABS: 2.0000 g | ORAL_TABLET | Freq: Two times a day (BID) | ORAL | 3 refills | Status: DC

## 2020-08-12 MED ORDER — COLESTIPOL HCL 1 G PO TABS: ORAL_TABLET | ORAL | 0 refills | Status: DC

## 2020-08-12 NOTE — Telephone Encounter (Signed)
See below . Thanks

## 2020-08-12 NOTE — Telephone Encounter (Signed)
Patient called states the verbal script is incorrect and she needs her medicine. Please correct and advise patient

## 2020-08-12 NOTE — Telephone Encounter (Signed)
Returned pt call to inquire further about how she is taking the medication. States she had a conversation with Dr. Tarri Glenn on the day of her procedure and informed her that she needed the Rx re-written for 2 tablets in the morning and 2 tablets in the evening. Advised this message will need to be routed to Dr. Tarri Glenn for further clarification about her request and change to her order. In addition, pt states she has not taken this medication for 1 month. Asked the following:  "How do I take the medication?", "Do I take the medication as requested or do I need to work myself up to it?"  Routing this message to Dr. Tarri Glenn.

## 2020-08-12 NOTE — Telephone Encounter (Signed)
Outpatient Medication Detail   Disp Refills Start End   colestipol (COLESTID) 1 g tablet 56 tablet 0 08/12/2020 09/09/2020   Sig: Take 1g po QD x 7 days, then 1g po BID x 7 days, then 2g po QAM and 1g po QPM x 7 days, then resume 2g po BID   Sent to pharmacy as: colestipol (COLESTID) 1 g tablet   Notes to Pharmacy: DISCONTINUE 08/11/20 ERRONEOUS RX FOR 1G BID. THIS RX IS TEMPORARY AND WILL END ON 09/09/20 WITH 0 REFILLS. DO NOT SEND REFILL REQUESTS.   E-Prescribing Status: Receipt confirmed by pharmacy (08/12/2020 4:26 PM EST)    Renewals  Renewal requests to authorizing provider Tarri Glenn, Joelene Millin, MD) prohibited      Outpatient Medication Detail   Disp Refills Start End   colestipol (COLESTID) 1 g tablet 360 tablet 3 09/10/2020    Sig - Route: Take 2 tablets (2 g total) by mouth 2 (two) times daily. - Oral   Sent to pharmacy as: colestipol (COLESTID) 1 g tablet   Notes to Pharmacy: DO NOT FILL THIS RX UNTIL 09/10/20.   E-Prescribing Status: Receipt confirmed by pharmacy (08/12/2020 4:26 PM EST)    Called pt and advised about Dr. Tarri Glenn response below as well as new Rx's sent today as written above. Verbalized acceptance and understanding.

## 2020-08-12 NOTE — Telephone Encounter (Signed)
Thanks for your help. Addressed in chart message from 08/07/20. She may resume 2 g BID. I would recommend starting with 1 g daily, after one week increasing to 1g BID, after one week increasing to 2g AM and 1g PM and one week later increase to 2g BID.  Thank you.

## 2020-08-13 ENCOUNTER — Other Ambulatory Visit: Payer: Self-pay | Admitting: Psychiatry

## 2020-08-13 DIAGNOSIS — F4310 Post-traumatic stress disorder, unspecified: Secondary | ICD-10-CM

## 2020-09-06 ENCOUNTER — Telehealth: Payer: Self-pay | Admitting: Gastroenterology

## 2020-09-07 NOTE — Telephone Encounter (Signed)
Pt states that she needs to be able to rf colestipol anytime because she will run out of med by 3/25. Rx order states not to rf until 3/26. Pt states that if that is the case she will have to be out of meds for at least a week since it takes a while to get rx because it is a mail order. Pls call her, she would like to speak with you directly.

## 2020-09-11 ENCOUNTER — Other Ambulatory Visit: Payer: Self-pay | Admitting: Psychiatry

## 2020-09-13 ENCOUNTER — Telehealth: Payer: Self-pay

## 2020-09-13 NOTE — Telephone Encounter (Signed)
Spoke to Amy(pharmacist)  at [facility]. Gave verbal order for Colestipol 1 gm- 2 tabs PO BID. [address]3 Refills. She stated they only have a 2  Months supply in stock and it is back order until sometime in April. She will sent out a 2 months supply this week and the additional month when the back order is lifted. I called patient and gave her this information

## 2020-09-14 NOTE — Telephone Encounter (Signed)
Rx's did not attach as intended. Rx's have now been attached as below:  Outpatient Medication Detail   Disp Refills Start End   colestipol (COLESTID) 1 g tablet 56 tablet 0 08/12/2020 09/09/2020   Sig: Take 1g po QD x 7 days, then 1g po BID x 7 days, then 2g po QAM and 1g po QPM x 7 days, then resume 2g po BID   Sent to pharmacy as: colestipol (COLESTID) 1 g tablet   Notes to Pharmacy: DISCONTINUE 08/11/20 ERRONEOUS RX FOR 1G BID. THIS RX IS TEMPORARY AND WILL END ON 09/09/20 WITH 0 REFILLS. DO NOT SEND REFILL REQUESTS.   E-Prescribing Status: Receipt confirmed by pharmacy (08/12/2020  4:26 PM EST)    Outpatient Medication Detail   Disp Refills Start End   colestipol (COLESTID) 1 g tablet 360 tablet 3 09/10/2020    Sig - Route: Take 2 tablets (2 g total) by mouth 2 (two) times daily. - Oral   Sent to pharmacy as: colestipol (COLESTID) 1 g tablet   Notes to Pharmacy: DO NOT FILL THIS RX UNTIL 09/10/20.   E-Prescribing Status: Receipt confirmed by pharmacy (08/12/2020  4:26 PM EST)

## 2020-09-14 NOTE — Telephone Encounter (Signed)
Not sure how you wish to address this. Below is a copy of the Rx's we sent to [facility]. Unfortunately, we do not have any control in terms of when a mail in pharmacy will ship a patient their medications. I did speak with [facility] today and they did confirm that they have processed the pt Rx and is ready for shipment. Rx will be leaving their facility today. It seems if this is going to be an ongoing concern, she may need to choose a local pharmacy to [place] the gap in her prescription care. This will result in her having to pay out of pocket for what may likely not be covered by her insurance d/t a refill too soon and overlap in multiple pharmacies attempting to process claims for the same Rx. As it stands, we have done all we could do in terms of her Rx to ensure there WOULD NOT be a lapse in prescription care.

## 2020-10-04 ENCOUNTER — Ambulatory Visit: Payer: PPO | Admitting: Gastroenterology

## 2020-10-04 ENCOUNTER — Encounter: Payer: Self-pay | Admitting: Gastroenterology

## 2020-10-04 VITALS — BP 128/80 | HR 112 | Ht 61.5 in | Wt 179.2 lb

## 2020-10-04 DIAGNOSIS — K529 Noninfective gastroenteritis and colitis, unspecified: Secondary | ICD-10-CM | POA: Diagnosis not present

## 2020-10-04 MED ORDER — RIFAXIMIN 550 MG PO TABS: 550.0000 mg | ORAL_TABLET | Freq: Two times a day (BID) | ORAL | 0 refills | Status: DC

## 2020-10-04 NOTE — Progress Notes (Addendum)
Referring Provider: Caren Macadam, MD Primary Care Physician:  Caren Macadam, MD   CHIEF COMPLAINT: frequent stools, bloating, reflux  IMPRESSION:  (1) Acute on chronic (>16 year history) of severe abdominal pain and cramping, 2-10 daily bowel movements, bloating, early satiety, eructation, nausea, and gas that seems to be triggered by dairy products, sugars, or sugar substitutes including sugar alcohols. No association with mammalian meats. Now with 8 pound weight loss in 3 months.  Has known lactulose intolerance. Extensive evaluation as outlined below.   Has had difficulty with low FODMAP diet due to cost and complexity.  Treatment for bile acid diarrhea provides limited relief of loose stools but not of other symptoms. Dicylcyclomine helps with some of the abdominal pain and cramping. Successful treatment for SIBO provided temporary relief, but, follow-up breath test to look for methane SIBO was negative.  FDGard was cost-prohibitive. Peppermint not tolerated due to exacerbation of reflux.   4-4-4 sucrose challenge was positive, suggesting disaccharide intolerance. Discussed treatment strategies including sucrase supplements OTC, following a diet that limits/avoids sugars, and could consider a trial of sacrosidase, but, I am afraid this will be cost prohibitive. Nutritionist consultation was not helpful. I am growing weary of my ability to make a difference and she is growing frustrated by ongoing symptoms and restricted diet. TCA not a treatment options.   I have asked her to go to tertiary care center for a second opinion.   (2) Moderate reflux on esophagram: Initially diagnosed in 2008 as part of asthma evaluation. Failed trial off of famotidine. Continue pantoprazole 40 mg BID in addition to famotidine. Lifestyle modifications.   (3) History of colon polyps. tHREE hyperplastic polypsDr. Collene Mares 2009. No polyps on colonoscopy 04/09/2018  (4) Duodenal adenoma: EGD recommended  to insure complete polypectomy.   PLAN: - Continue to avoid dairy and other food triggers - Continue colestipol 1g BID (3 month supply with 3 refills), she will titrate it as needed - Continue dicyclomine 20 mg QID - taken before meals - Continue pantoprazole 40 mg BID in addition to famotidine - Repeat Xifaxan 550mg  TID x 14 days, if not improved by insurance doxycycline 100 mg BID x 14 days - Start OTC sucrase enzymes - Start daily probiotic if not responding to sucrase - Eliminate table sugar, beet sugar, brown sugar, cane sugar, caramel sugar, coconut sugar, confectioner's sugar, date sugar, and raw sugar.  Will reduce starch if still symptomatic. Consider sucrosidase but, I am afraid this will be cost-prohibitive given our challenges with other treatment options.  - Referral to [facility]: Dr. Derrill Kay - Consider TCA versus Baclofen in the future - EGD to insure complete removal of duodenal polyp 01/2021 - Colonoscopy 2024 - Consider contrasted cross-sectional imaging as she has had no recent abdominal imaging/weight loss - Follow-up in 2-4 months, earlier as needed  I spent over 45 minutes, including in depth chart review, independent review of results, communicating results with the patient directly, face-to-face time with the patient and her daughter, coordinating care, ordering studies and medications as appropriate, and documentation.  HPI: Brittany [name] is a 65 y.o. female who returns in follow-up. Her daughter and 57 month grandson accompany her to this appointment.  She continues to have episodes of severe abdominal pain and cramping described as a hot pit in her stomach, 2-10 daily bowel movements, bloating, early satiety, eructation, nausea, and gas that seems to be triggered by dairy products, sugars, or sugar substitutes including sugar alcohols. Has to lie in  the fetal position with severe symptoms.  No noted association with mammalian meats. Bothered by a large amount of  ongoing gas. No change in symptoms with eating or movement. Switching to plant-based dairy products has provided some relief. Requires postprandial Imodium and Lactaid to leave the house. Exacerbation in all symptoms after having Covid 04/2019 and with stress and anxiety.   History of evaluation and attempts at symptom management: - Symptoms initially started 16 years ago following cholecystectomy for cholelithiasis - Prior diagnosis of IBS with Dr. Collene Mares, treated with Align - Endoscopic evaluation 04/09/2018 revealed pancolonic diverticulosis and was otherwise normal. Gastric biopsy showed chronic inactive gastritis with no evidence of H. pylori.  Duodenum biopsies and random colon biopsies were normal. - Incomplete response to colestipol and initially did not tolerate a dose increase to 2 BID due to severe constipation. Since resuming colestipol, her stools are more formed. She has been titrating the dose to help control her symptoms.  - Two weeks of Xifaxan provided significant relief in her symptoms. She had no significant bloating or diarrhea during this time.  However all symptoms returned within 2 weeks of completing treatment.  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  Increase in hydrogen was 12 ppm, increase in methane 3 ppm, increase in combined hydrogen and methane 15 ppm which is the cutoff for diagnosis. - Possible SIBO retreated with doxycycline 100 mg BID x 14 days.  Initially felt better, but, symptoms recurred within 2 weeks.  - Seen by nutritionist but the interaction was not very helpful. Tried a low FODMAP diet but it is difficult due to complexity and cost. The nutritionist did not recommend any additional follow-up because she wasn't following the diet recommendation. - Normal CBC and BMP 02/11/20   - Repeat SIBO hydrogen and methane  breath results 04/04/2020 showed an increase in hydrogen by 18 ppm, increase in methane by 4 ppm and a total increase in combined hydrogen and methane by 22 ppm.  However, most of this happened after 100 minutes which is not consistent with SIBO. - Labs 04/06/20: hemoglobin 15.3, platelets 273, MCV 94.6, RDW 12.8, iron 78, ferritin 36, % sat 21 - Labs 05/26/2020: Stool negative for Cryptosporidium and Giardia; fecal calprotectin 19, pancreatic elastase >500 (normal) - Unable to tolerate peppermint due to heartburn.   - Unable to trial FDGard due to cost. - 4-4-4 sucrose challenge positive, suggesting disaccharide intolerance - EGD 08/08/20: Gastritis, duodenal polyp with pathology showing adenoma  Her symptoms persist without change since her last visit 07/05/20.  She has ongoing stomach pain. May be better since she stopped topirimate.  Avoid sugar. However, she has been able to eat 2-3 M&Ms without initiating her symptoms. Following a plant based diet.   Weight now stable but she is concerned because her diet is so heavy on carbohydrates, potoato chips, and wheat bread. Review from prior visits shows that she has lost 8 pounds since 03/2020.     Prior endoscopy: - Upper endoscopy and colonoscopy 04/09/2018  revealed pancolonic diverticulosis and was otherwise normal. Gastric biopsy showed chronic inactive gastritis with no evidence of H. pylori.  Duodenum biopsies and random colon biopsies were normal - EGD 08/08/20: Gastritis, duodenal polyp with pathology showing adenoma  Past Medical History:  Diagnosis Date  . Abnormal EKG   . Allergy   . Anxiety   . Asthma   . Bipolar depression ([place]),  Anxiety, PTSD, Panic disorder    -managed by [facility]  . Clotting disorder [facility])    Nov 2020 - on Eliquis due to [place].  Have not had Eliquis since Nov 2020 per pt.   . Depression   . Foot pain   . GERD (gastroesophageal reflux disease)   . Heart murmur    as a child  . Insomnia   .  Leg swelling   . Osteoporosis    osteopenia  . Tachycardia     Past Surgical History:  Procedure Laterality Date  . BREAST BIOPSY Right    biopsy of nipple  . CARPAL TUNNEL RELEASE Left   . CESAREAN SECTION     x 2  . CHOLECYSTECTOMY    . COLONOSCOPY    . ENDOMETRIAL ABLATION    . KNEE SURGERY Right    reconstruction for patellar dislocation  . LAPAROSCOPY     x 2  . TUBAL LIGATION    . ULNAR NERVE REPAIR Left   . UPPER GASTROINTESTINAL ENDOSCOPY      Current Outpatient Medications  Medication Sig Dispense Refill  . albuterol (PROVENTIL) (2.5 MG/3ML) 0.083% nebulizer solution Take 3 mLs (2.5 mg total) by nebulization every 6 (six) hours as needed for wheezing or shortness of breath. 75 mL 12  . Albuterol Sulfate (PROAIR RESPICLICK) 401 (90 Base) MCG/ACT AEPB Inhale 2 puffs into the lungs every 6 (six) hours as needed. 1 each 1  . azelastine (ASTELIN) 0.1 % nasal spray Place 1 spray into both nostrils 2 (two) times daily. Use in each nostril as directed 30 mL 12  . budesonide-formoterol (SYMBICORT) 160-4.5 MCG/ACT inhaler Inhale 2 puffs into the lungs 2 (two) times daily. 1 Inhaler 11  . busPIRone (BUSPAR) 15 MG tablet TAKE ONE TABLET BY MOUTH THREE TIMES A DAY 270 tablet 1  . cetirizine (ZYRTEC) 10 MG tablet Take 1 tablet (10 mg total) by mouth daily. 90 tablet 1  . Cholecalciferol (VITAMIN D-3 PO) Take 10,000 Units by mouth daily.     . colestipol (COLESTID) 1 g tablet Take 2 tablets (2 g total) by mouth 2 (two) times daily. 360 tablet 3  . diazepam (VALIUM) 5 MG tablet TAKE ONE TABLET BY MOUTH EVERY 6 HOURS AS NEEDED FOR ANXIETY 120 tablet 5  . dicyclomine (BENTYL) 20 MG tablet TAKE ONE TABLET BY MOUTH FOUR TIMES A DAY AS NEEDED FOR SPASMS 120 tablet 3  . doxazosin (CARDURA) 4 MG tablet TAKE TWO TABLETS BY MOUTH EVERY NIGHT AT BEDTIME 180 tablet 1  . famotidine (PEPCID) 20 MG tablet TAKE ONE TABLET BY MOUTH TWICE A DAY 180 tablet 3  . ferrous sulfate 325 (65 FE) MG EC tablet  Take 325 mg by mouth 2 (two) times a week.    . fluticasone (FLONASE) 50 MCG/ACT nasal spray SPRAY TWO SPRAYS IN EACH NOSTRIL TWICE DAILY 96 mL 2  . mirtazapine (REMERON) 15 MG tablet TAKE ONE TABLET BY MOUTH EVERY NIGHT AT BEDTIME 90 tablet 0  . montelukast (SINGULAIR) 10 MG tablet TAKE ONE TABLET BY MOUTH AT BEDTIME 90 tablet 1  . Multiple Vitamin (MULTIVITAMIN WITH MINERALS) TABS tablet Take 1 tablet by mouth daily.    Marland Kitchen omega-3 acid ethyl esters (LOVAZA) 1 g capsule Take by mouth daily.    . pantoprazole (PROTONIX) 40 MG tablet Take 1 tablet (40 mg total) by mouth 2 (two) times daily. 180 tablet 3   Current Facility-Administered Medications  Medication Dose Route Frequency Provider Last Rate Last  Admin  . 0.9 %  sodium chloride infusion  500 mL Intravenous Once Thornton Park, MD        Allergies as of 10/04/2020 - Review Complete 10/04/2020  Allergen Reaction Noted  . Aspirin Other (See Comments)   . Codeine Itching 09/26/2017  . Sulfamethoxazole-trimethoprim Other (See Comments)   . Trileptal [oxcarbazepine]  11/28/2018  . Trimethoprim Other (See Comments) 11/12/2014  . Ultram [tramadol] Itching 09/26/2017  . Vioxx [rofecoxib]      Family History  Problem Relation Age of Onset  . Heart failure Mother   . Hypertension Mother   . Hyperlipidemia Mother   . Congestive Heart Failure Mother   . Other Father        killed  . COPD Paternal 70        a lot of aunts and uncle COPD or Emphysema  . Emphysema Paternal Uncle   . Colon polyps Maternal Aunt   . COPD Brother   . Heart disease Brother   . Pulmonary embolism Brother   . Breast cancer Neg Hx   . Colon cancer Neg Hx   . Esophageal cancer Neg Hx   . Pancreatic cancer Neg Hx   . Stomach cancer Neg Hx   . Liver disease Neg Hx   . Rectal cancer Neg Hx     Social History   Socioeconomic History  . Marital status: Single    Spouse name: Not on file  . Number of children: 3  . Years of education: Not on file  .  Highest education level: Not on file  Occupational History  . Occupation: disabled  Tobacco Use  . Smoking status: Former Smoker    Packs/day: 20.00    Years: 1.00    Pack years: 20.00    Types: Cigarettes    Quit date: 06/19/1999    Years since quitting: 21.3  . Smokeless tobacco: Never Used  . Tobacco comment: 20 year estimate but probably less   Vaping Use  . Vaping Use: Never used  Substance and Sexual Activity  . Alcohol use: Yes    Alcohol/week: 0.0 standard drinks    Comment: occ  . Drug use: No  . Sexual activity: Never  Other Topics Concern  . Not on file  Social History Narrative   Work or School: Disabled seconndary to psychiatric conditions      Home Situation: lives alone with 2 cats and one dog      Spiritual Beliefs: Christian      Lifestyle: no regular exercise, diet  Not great - wants to embark on healthier lifestyle   Social Determinants of Health   Financial Resource Strain: Medium Risk  . Difficulty of Paying Living Expenses: Somewhat hard  Food Insecurity: No Food Insecurity  . Worried About Charity fundraiser in the Last Year: Never true  . Ran Out of Food in the Last Year: Never true  Transportation Needs: No Transportation Needs  . Lack of Transportation (Medical): No  . Lack of Transportation (Non-Medical): No  Physical Activity: Inactive  . Days of Exercise per Week: 0 days  . Minutes of Exercise per Session: 0 min  Stress: Stress Concern Present  . Feeling of Stress : To some extent  Social Connections: Moderately Isolated  . Frequency of Communication with Friends and Family: Twice a week  . Frequency of Social Gatherings with Friends and Family: Once a week  . Attends Religious Services: 1 to 4 times per year  . Active Member  of Clubs or Organizations: No  . Attends Archivist Meetings: Never  . Marital Status: Never married  Intimate Partner Violence: Not At Risk  . Fear of Current or Ex-Partner: No  . Emotionally Abused:  No  . Physically Abused: No  . Sexually Abused: No    Physical Exam: General:   Alert,  well-nourished, pleasant and cooperative in NAD.  Eyes are open. Head:  Normocephalic and atraumatic. Eyes:  Sclera clear, no icterus.   Conjunctiva pink. Abdomen:  Soft, nontender, nondistended, normal bowel sounds, no rebound or guarding. No hepatosplenomegaly.  No obvious ascites.  No abdominal wall hernias.  No succession splash. Neurologic:  Alert and  oriented x4;  grossly nonfocal Skin:  Intact without significant lesions or rashes. Psych:  Alert and cooperative. Normal mood and affect.   Herve Haug L. Tarri Glenn, MD, MPH [facility] 10/04/2020, 11:26 AM

## 2020-10-04 NOTE — Patient Instructions (Addendum)
It was a pleasure to see you today. Based on our discussion, I am providing you with my recommendations below:  RECOMMENDATION(S):    I will place a referral to [facility]. Please verify with your insurance to determine that they are a covered provider on your plan. Below is the scheduling process.   I am providing you with samples of Xifaxan to bridge the gap for completion of the Prior Authorization  PRESCRIPTION MEDICATION(S):   We have sent the following medication(s) to your pharmacy:  . Doreene Nest - please take tablet 2 times daily until gone  NOTE: If your medication(s) requires a PRIOR AUTHORIZATION, we will receive notification from your pharmacy. Once received, the process to submit for approval may take up to 7-10 business days. You will be contacted about any denials we have received from your insurance company as well as alternatives recommended by your provider.  PROCEDURE:  . I am recommending that you have a(n) ENDOSCOPY completed at the end of July or beginning of August. Per your request, my staff did not schedule the procedure(s) today. When you are ready to proceed with scheduling, please contact my office at [phone].   NOTE:   . At the time of scheduling your procedure, you will also be scheduled for a pre-visit with my nurse. During this appointment, you will be provided with your prep instructions.  REFERRAL:  . A referral, your demographics, a copy of your insurance card and your records will be sent to [facility]. You will receive a call from their office regarding the date, time and location of your appointment.  BMI:  . If you are age 65 or older, your body mass index should be between 23-30. Your There is no height or weight on file to calculate BMI. If this is out of the aforementioned range listed, please consider follow up with your Primary Care Provider.  Thank you for trusting me with your gastrointestinal care!     Thornton Park, MD, MPH

## 2020-10-04 NOTE — Progress Notes (Signed)
REFERRAL - [facility]  Location where fax was sent: [facility]  Documents Faxed: [facility], referral form, insurance card, office notes, imaging, path reports and procedure reports Confirmation received: Yes Documents and confirmation held for future reference: Yes

## 2020-10-16 ENCOUNTER — Encounter: Payer: Self-pay | Admitting: Family Medicine

## 2020-10-17 ENCOUNTER — Encounter: Payer: Self-pay | Admitting: Family Medicine

## 2020-10-17 ENCOUNTER — Ambulatory Visit (INDEPENDENT_AMBULATORY_CARE_PROVIDER_SITE_OTHER): Payer: PPO | Admitting: Family Medicine

## 2020-10-17 ENCOUNTER — Other Ambulatory Visit: Payer: Self-pay

## 2020-10-17 VITALS — BP 112/80 | HR 97 | Temp 98.7°F | Wt 180.0 lb

## 2020-10-17 DIAGNOSIS — J029 Acute pharyngitis, unspecified: Secondary | ICD-10-CM | POA: Diagnosis not present

## 2020-10-17 MED ORDER — METHYLPREDNISOLONE ACETATE 40 MG/ML IJ SUSP
40.0000 mg | Freq: Once | INTRAMUSCULAR | Status: AC
  Administered 2020-10-17: 40 mg via INTRAMUSCULAR

## 2020-10-17 MED ORDER — METHYLPREDNISOLONE ACETATE 80 MG/ML IJ SUSP
80.0000 mg | Freq: Once | INTRAMUSCULAR | Status: AC
  Administered 2020-10-17: 80 mg via INTRAMUSCULAR

## 2020-10-17 NOTE — Progress Notes (Signed)
   Subjective:    Patient ID: Brittany [name], female    DOB: 11-05-1955, 65 y.o.   MRN: [id number]  HPI Here for several symptom complexes. First about 2 weeks ago she developed some sinus congestion, PND, and a dry cough. No fever. No SOB. She has been using her typical allergy medications like Zyrtec, Flonase, Azelastine, and Symbicort. She has albuterol inhalers and a nebulizer at home, but she has not needed to use these. She drinks plenty of fluids. These symptoms seemed to improve some, but then one week ago she developed a bad ST. No fever, no swollen lymph nodes. Concurrent with all this, she has been taking a 14 day course of Xifaxan for Dr. Tarri Glenn for a GI issue. She has tested negative for the Covid virus at home 3 times in the past 2 weeks.    Review of Systems  Constitutional: Negative.  Negative for fever.  HENT: Positive for congestion, postnasal drip, sinus pressure and sore throat.   Eyes: Negative.   Respiratory: Positive for cough. Negative for shortness of breath and wheezing.   Gastrointestinal: Negative.        Objective:   Physical Exam Constitutional:      Appearance: Normal appearance. She is not ill-appearing.  HENT:     Right Ear: Tympanic membrane, ear canal and external ear normal.     Left Ear: Tympanic membrane, ear canal and external ear normal.     Nose: Nose normal.     Mouth/Throat:     Pharynx: Oropharynx is clear. No oropharyngeal exudate or posterior oropharyngeal erythema.  Eyes:     Conjunctiva/sclera: Conjunctivae normal.  Pulmonary:     Effort: Pulmonary effort is normal.     Breath sounds: Normal breath sounds.  Lymphadenopathy:     Cervical: No cervical adenopathy.  Neurological:     Mental Status: She is alert.           Assessment & Plan:  She seems to have several things going on simultaneously. Her seasonal allergies have been acting up, and then she got a viral URI on top of that. Her ST may be from an adenovirus that  has been in the community lately. We will give her a DepoMedrol shot today, and she can use Delsym as needed.  Alysia Penna, MD

## 2020-10-17 NOTE — Addendum Note (Signed)
Addended by: Wyvonne Lenz on: 10/17/2020 03:35 PM   Modules accepted: Orders

## 2020-10-18 NOTE — Progress Notes (Signed)
Called to f/u on the status of [facility] referral. Spoke with Apolonio Schneiders. States records were sent for nursing review on 4/27 and was to be returned today for pt to be called and scheduled. According to Apolonio Schneiders, states she is sending a message to nursing to advise them of my inquiry. States they will return my call once pt has been scheduled. Will cont efforts to f/u on scheduling status.

## 2020-10-20 NOTE — Progress Notes (Signed)
Received notification from [facility] indicating that they are unable to see pt as their input would not alter the current POT. Providing Dr. Tarri Glenn with letter for her review.

## 2020-10-21 ENCOUNTER — Other Ambulatory Visit (HOSPITAL_COMMUNITY)
Admission: RE | Admit: 2020-10-21 | Discharge: 2020-10-21 | Disposition: A | Discharge status: Home / Self Care | Payer: PPO | Source: Ambulatory Visit | Attending: Pulmonary Disease | Admitting: Pulmonary Disease

## 2020-10-21 DIAGNOSIS — Z01812 Encounter for preprocedural laboratory examination: Secondary | ICD-10-CM | POA: Insufficient documentation

## 2020-10-21 DIAGNOSIS — Z20822 Contact with and (suspected) exposure to covid-19: Secondary | ICD-10-CM | POA: Diagnosis not present

## 2020-10-22 ENCOUNTER — Other Ambulatory Visit (HOSPITAL_COMMUNITY): Payer: PPO

## 2020-10-22 LAB — SARS CORONAVIRUS 2 (TAT 6-24 HRS): SARS Coronavirus 2: NEGATIVE

## 2020-10-25 ENCOUNTER — Ambulatory Visit (INDEPENDENT_AMBULATORY_CARE_PROVIDER_SITE_OTHER): Payer: PPO | Admitting: Pulmonary Disease

## 2020-10-25 ENCOUNTER — Encounter: Payer: Self-pay | Admitting: Pulmonary Disease

## 2020-10-25 ENCOUNTER — Ambulatory Visit: Payer: PPO | Admitting: Pulmonary Disease

## 2020-10-25 ENCOUNTER — Other Ambulatory Visit: Payer: Self-pay

## 2020-10-25 VITALS — BP 124/70 | HR 78 | Temp 97.8°F | Ht 62.0 in | Wt 179.0 lb

## 2020-10-25 DIAGNOSIS — J4540 Moderate persistent asthma, uncomplicated: Secondary | ICD-10-CM | POA: Diagnosis not present

## 2020-10-25 DIAGNOSIS — R0609 Other forms of dyspnea: Secondary | ICD-10-CM

## 2020-10-25 LAB — PULMONARY FUNCTION TEST
DL/VA % pred: 105 %
DL/VA: 4.46 ml/min/mmHg/L
DLCO cor % pred: 107 %
DLCO cor: 19.95 ml/min/mmHg
DLCO unc % pred: 107 %
DLCO unc: 19.95 ml/min/mmHg
FEF 25-75 Post: 1.39 L/sec
FEF 25-75 Pre: 1.26 L/sec
FEF2575-%Change-Post: 10 %
FEF2575-%Pred-Post: 68 %
FEF2575-%Pred-Pre: 62 %
FEV1-%Change-Post: 4 %
FEV1-%Pred-Post: 89 %
FEV1-%Pred-Pre: 85 %
FEV1-Post: 2 L
FEV1-Pre: 1.92 L
FEV1FVC-%Change-Post: -1 %
FEV1FVC-%Pred-Pre: 90 %
FEV6-%Change-Post: 5 %
FEV6-%Pred-Post: 103 %
FEV6-%Pred-Pre: 97 %
FEV6-Post: 2.9 L
FEV6-Pre: 2.74 L
FEV6FVC-%Pred-Post: 104 %
FEV6FVC-%Pred-Pre: 104 %
FVC-%Change-Post: 5 %
FVC-%Pred-Post: 99 %
FVC-%Pred-Pre: 93 %
FVC-Post: 2.9 L
FVC-Pre: 2.74 L
Post FEV1/FVC ratio: 69 %
Post FEV6/FVC ratio: 100 %
Pre FEV1/FVC ratio: 70 %
Pre FEV6/FVC Ratio: 100 %
RV % pred: 105 %
RV: 2.09 L
TLC % pred: 105 %
TLC: 5 L

## 2020-10-25 MED ORDER — BREZTRI AEROSPHERE 160-9-4.8 MCG/ACT IN AERO: 2.0000 | INHALATION_SPRAY | Freq: Two times a day (BID) | RESPIRATORY_TRACT | 0 refills | Status: DC

## 2020-10-25 MED ORDER — BREZTRI AEROSPHERE 160-9-4.8 MCG/ACT IN AERO: 2.0000 | INHALATION_SPRAY | Freq: Two times a day (BID) | RESPIRATORY_TRACT | 5 refills | Status: DC

## 2020-10-25 MED ORDER — ALBUTEROL SULFATE (2.5 MG/3ML) 0.083% IN NEBU: 2.5000 mg | INHALATION_SOLUTION | Freq: Four times a day (QID) | RESPIRATORY_TRACT | 12 refills | Status: DC | PRN

## 2020-10-25 MED ORDER — PROAIR RESPICLICK 108 (90 BASE) MCG/ACT IN AEPB: 2.0000 | INHALATION_SPRAY | Freq: Four times a day (QID) | RESPIRATORY_TRACT | 6 refills | Status: DC | PRN

## 2020-10-25 NOTE — Progress Notes (Signed)
Full PFT performed today. °

## 2020-10-25 NOTE — Patient Instructions (Addendum)
Try Breztri Inhaler 2 puffs twice daily  - rinse mouth out after each use  Stop symbicort while using the Breztri inhaler  Continue to use albuterol as needed

## 2020-10-25 NOTE — Patient Instructions (Signed)
Full PFT performed today. °

## 2020-10-25 NOTE — Progress Notes (Signed)
Synopsis: Referred in 2018 for asthma by Caren Macadam, MD.  Previously patient of Dr. Melvyn Novas and Dr. Gwenette Greet.   Subjective:   PATIENT ID: Brittany [name] GENDER: female DOB: 12-Nov-1955, MRN: [id number]  Chief Complaint  Patient presents with  . Follow-up    3 mo f/u after PFT. States her breathing has been stable since last visit.      HPI Brittany [name] is a 65 year old woman, former smoker with covid 19 pneumonia in 2020 and asthma who returns to pulmonary clinic for follow up.   She has done well since last visit but reports developing shortness of breath 2 weeks ago and received a steroid injection from her primary care with improvement in her breathing. She has been having a hard time with the pollen this spring as her allergies are acting up.   She is using symbicort 160-4.49mcg 2 puffs twice daily and as needed albuterol.   PFTs today show mild obstruction but overall improvement in the FEV1, FVC and, DLCO and TLC.   Lab Results  Component Value Date   NITRICOXIDE 14 07/16/2016    Past Medical History:  Diagnosis Date  . Abnormal EKG   . Allergy   . Anxiety   . Asthma   . Bipolar depression ([place]), Anxiety, PTSD, Panic disorder    -managed by [facility]  . Clotting disorder [facility])    Nov 2020 - on Eliquis due to [place].  Have not had Eliquis since Nov 2020 per pt.   . Depression   . Foot pain   . GERD (gastroesophageal reflux disease)   . Heart murmur    as a child  . Insomnia   . Leg swelling   . Osteoporosis    osteopenia  . Tachycardia      Family History  Problem Relation Age of Onset  . Heart failure Mother   . Hypertension Mother   . Hyperlipidemia Mother   . Congestive Heart Failure Mother   . Other Father        killed  . COPD Paternal 33        a lot of aunts and uncle COPD or Emphysema  . Emphysema Paternal Uncle   . Colon polyps Maternal Aunt   . COPD Brother   . Heart disease Brother   . Pulmonary embolism Brother    . Breast cancer Neg Hx   . Colon cancer Neg Hx   . Esophageal cancer Neg Hx   . Pancreatic cancer Neg Hx   . Stomach cancer Neg Hx   . Liver disease Neg Hx   . Rectal cancer Neg Hx      Past Surgical History:  Procedure Laterality Date  . BREAST BIOPSY Right    biopsy of nipple  . CARPAL TUNNEL RELEASE Left   . CESAREAN SECTION     x 2  . CHOLECYSTECTOMY    . COLONOSCOPY    . ENDOMETRIAL ABLATION    . KNEE SURGERY Right    reconstruction for patellar dislocation  . LAPAROSCOPY     x 2  . TUBAL LIGATION    . ULNAR NERVE REPAIR Left   . UPPER GASTROINTESTINAL ENDOSCOPY      Social History   Socioeconomic History  . Marital status: Single    Spouse name: Not on file  . Number of children: 3  . Years of education: Not on file  . Highest education level: Not on file  Occupational History  .  Occupation: disabled  Tobacco Use  . Smoking status: Former Smoker    Packs/day: 20.00    Years: 1.00    Pack years: 20.00    Types: Cigarettes    Quit date: 06/19/1999    Years since quitting: 21.3  . Smokeless tobacco: Never Used  . Tobacco comment: 20 year estimate but probably less   Vaping Use  . Vaping Use: Never used  Substance and Sexual Activity  . Alcohol use: Yes    Alcohol/week: 0.0 standard drinks    Comment: occ  . Drug use: No  . Sexual activity: Never  Other Topics Concern  . Not on file  Social History Narrative   Work or School: Disabled seconndary to psychiatric conditions      Home Situation: lives alone with 2 cats and one dog      Spiritual Beliefs: Christian      Lifestyle: no regular exercise, diet  Not great - wants to embark on healthier lifestyle   Social Determinants of Health   Financial Resource Strain: Medium Risk  . Difficulty of Paying Living Expenses: Somewhat hard  Food Insecurity: No Food Insecurity  . Worried About Charity fundraiser in the Last Year: Never true  . Ran Out of Food in the Last Year: Never true  Transportation  Needs: No Transportation Needs  . Lack of Transportation (Medical): No  . Lack of Transportation (Non-Medical): No  Physical Activity: Inactive  . Days of Exercise per Week: 0 days  . Minutes of Exercise per Session: 0 min  Stress: Stress Concern Present  . Feeling of Stress : To some extent  Social Connections: Moderately Isolated  . Frequency of Communication with Friends and Family: Twice a week  . Frequency of Social Gatherings with Friends and Family: Once a week  . Attends Religious Services: 1 to 4 times per year  . Active Member of Clubs or Organizations: No  . Attends Archivist Meetings: Never  . Marital Status: Never married  Intimate Partner Violence: Not At Risk  . Fear of Current or Ex-Partner: No  . Emotionally Abused: No  . Physically Abused: No  . Sexually Abused: No     Allergies  Allergen Reactions  . Aspirin Other (See Comments)    discomfort  . Codeine Itching  . Sulfamethoxazole-Trimethoprim Other (See Comments)    Unknown reaction per pt  . Topiramate Other (See Comments)    Per patient, it causes stomach burning  . Trileptal [Oxcarbazepine]     itching  . Trimethoprim Other (See Comments)    Unknown, per pt  . Ultram [Tramadol] Itching  . Vioxx [Rofecoxib]     Unknown per pt     Immunization History  Administered Date(s) Administered  . Influenza Split 04/26/2011  . Influenza Whole 03/18/2009, 03/18/2010, 03/18/2016  . Influenza,inj,Quad PF,6+ Mos 04/19/2014, 02/28/2017, 03/06/2018, 02/13/2019, 03/04/2020  . PFIZER(Purple Top)SARS-COV-2 Vaccination 09/04/2019, 09/29/2019, 04/06/2020  . Pneumococcal Conjugate-13 03/10/2015  . Pneumococcal Polysaccharide-23 06/18/2005  . Tdap 12/04/2016  . Zoster Recombinat (Shingrix) 01/01/2018, 06/22/2018    Outpatient Medications Prior to Visit  Medication Sig Dispense Refill  . azelastine (ASTELIN) 0.1 % nasal spray Place 1 spray into both nostrils 2 (two) times daily. Use in each nostril as  directed 30 mL 12  . budesonide-formoterol (SYMBICORT) 160-4.5 MCG/ACT inhaler Inhale 2 puffs into the lungs 2 (two) times daily. 1 Inhaler 11  . busPIRone (BUSPAR) 15 MG tablet TAKE ONE TABLET BY MOUTH THREE TIMES A DAY 270  tablet 1  . cetirizine (ZYRTEC) 10 MG tablet Take 1 tablet (10 mg total) by mouth daily. 90 tablet 1  . Cholecalciferol (VITAMIN D-3 PO) Take 10,000 Units by mouth daily.     . colestipol (COLESTID) 1 g tablet Take 2 tablets (2 g total) by mouth 2 (two) times daily. 360 tablet 3  . diazepam (VALIUM) 5 MG tablet TAKE ONE TABLET BY MOUTH EVERY 6 HOURS AS NEEDED FOR ANXIETY 120 tablet 5  . dicyclomine (BENTYL) 20 MG tablet TAKE ONE TABLET BY MOUTH FOUR TIMES A DAY AS NEEDED FOR SPASMS 120 tablet 3  . doxazosin (CARDURA) 4 MG tablet TAKE TWO TABLETS BY MOUTH EVERY NIGHT AT BEDTIME 180 tablet 1  . famotidine (PEPCID) 20 MG tablet TAKE ONE TABLET BY MOUTH TWICE A DAY 180 tablet 3  . ferrous sulfate 325 (65 FE) MG EC tablet Take 325 mg by mouth 2 (two) times a week.    . fluticasone (FLONASE) 50 MCG/ACT nasal spray SPRAY TWO SPRAYS IN EACH NOSTRIL TWICE DAILY 96 mL 2  . mirtazapine (REMERON) 15 MG tablet TAKE ONE TABLET BY MOUTH EVERY NIGHT AT BEDTIME 90 tablet 0  . montelukast (SINGULAIR) 10 MG tablet TAKE ONE TABLET BY MOUTH AT BEDTIME 90 tablet 1  . Multiple Vitamin (MULTIVITAMIN WITH MINERALS) TABS tablet Take 1 tablet by mouth daily.    Marland Kitchen omega-3 acid ethyl esters (LOVAZA) 1 g capsule Take by mouth daily.    . pantoprazole (PROTONIX) 40 MG tablet Take 1 tablet (40 mg total) by mouth 2 (two) times daily. 180 tablet 3  . albuterol (PROVENTIL) (2.5 MG/3ML) 0.083% nebulizer solution Take 3 mLs (2.5 mg total) by nebulization every 6 (six) hours as needed for wheezing or shortness of breath. 75 mL 12  . Albuterol Sulfate (PROAIR RESPICLICK) 355 (90 Base) MCG/ACT AEPB Inhale 2 puffs into the lungs every 6 (six) hours as needed. 1 each 1  . rifaximin (XIFAXAN) 550 MG TABS tablet Take 1  tablet (550 mg total) by mouth 2 (two) times daily. 20 tablet 0   Facility-Administered Medications Prior to Visit  Medication Dose Route Frequency Provider Last Rate Last Admin  . 0.9 %  sodium chloride infusion  500 mL Intravenous Once Thornton Park, MD        Review of Systems  Constitutional: Negative for chills, diaphoresis, fever, malaise/fatigue and weight loss.  HENT: Negative for congestion and nosebleeds.   Eyes: Negative.   Respiratory: Negative for cough, hemoptysis, sputum production, shortness of breath and wheezing.   Cardiovascular: Negative for chest pain, palpitations, orthopnea, claudication and leg swelling.  Gastrointestinal: Negative for heartburn and nausea.  Genitourinary: Negative.   Musculoskeletal: Negative for joint pain and myalgias.  Neurological: Negative for dizziness, weakness and headaches.  Endo/Heme/Allergies: Positive for environmental allergies.  Psychiatric/Behavioral: Negative.     Objective:   Vitals:   10/25/20 1101  BP: 124/70  Pulse: 78  Temp: 97.8 F (36.6 C)  TempSrc: Temporal  SpO2: 98%  Weight: 179 lb (81.2 kg)  Height: 5\' 2"  (1.575 m)   98% on  RA BMI Readings from Last 3 Encounters:  10/25/20 32.74 kg/m  10/17/20 33.46 kg/m  10/04/20 33.31 kg/m   Wt Readings from Last 3 Encounters:  10/25/20 179 lb (81.2 kg)  10/17/20 180 lb (81.6 kg)  10/04/20 179 lb 3.2 oz (81.3 kg)    Physical Exam Vitals reviewed.  Constitutional:      General: She is not in acute distress.  Appearance: She is obese. She is not ill-appearing.  HENT:     Head: Normocephalic and atraumatic.  Eyes:     General: No scleral icterus. Cardiovascular:     Rate and Rhythm: Normal rate and regular rhythm.     Heart sounds: No murmur heard.   Pulmonary:     Effort: Pulmonary effort is normal.     Breath sounds: Normal breath sounds. No wheezing, rhonchi or rales.  Abdominal:     General: There is no distension.     Palpations: Abdomen  is soft.  Musculoskeletal:     Cervical back: Neck supple.     Right lower leg: No edema.     Left lower leg: No edema.  Lymphadenopathy:     Cervical: No cervical adenopathy.  Skin:    General: Skin is warm and dry.     Findings: No rash.  Neurological:     General: No focal deficit present.     Mental Status: She is alert.     Coordination: Coordination normal.  Psychiatric:        Mood and Affect: Mood normal.        Behavior: Behavior normal.    CBC    Component Value Date/Time   WBC 4.7 04/06/2020 0916   RBC 4.83 04/06/2020 0916   HGB 15.3 04/06/2020 0916   HCT 45.7 (H) 04/06/2020 0916   PLT 273 04/06/2020 0916   MCV 94.6 04/06/2020 0916   MCH 31.7 04/06/2020 0916   MCHC 33.5 04/06/2020 0916   RDW 12.8 04/06/2020 0916   LYMPHSABS 855 04/06/2020 0916   MONOABS 0.5 02/11/2020 1237   EOSABS 42 04/06/2020 0916   BASOSABS 52 04/06/2020 0916   Chest Imaging- films reviewed: HRCT Chest 03/2020  Moderate patchy ground glass opacity, reticulation and septal thickening throughout both lungs with associated mild traction bronchiectasis and arcitectural distortion, with an upper lobe predominance, asymmetrically prominent on the left. No frank honeycombing. Previously noted acute opacities on 04/22/2019 chest CTA have improved/resolved while the fibrotic components are new.   CXR, 2 view 05/12/2019-left greater than right opacities and reticulation.  Possible posterior pleural effusion silhouetting hemidiaphragm  CTA chest 04/22/2019-left greater than right peripheral groundglass opacities.  No PE.  CXR 02/11/2020-increased interstitial markings throughout  Echocardiogram 01/26/20: LVEF 60 to 65%, no regional wall motion abnormalities.  Mild concentric LVH, moderate basal septal hypertrophy.  Normal LA.  RV with mildly reduced function, normal size.  Normal RA.  Normal valves.  Pulmonary Functions Testing Results: PFT Results Latest Ref Rng & Units 10/25/2020 09/03/2019  FVC-Pre  L 2.74 2.55  FVC-Predicted Pre % 93 86  FVC-Post L 2.90 2.71  FVC-Predicted Post % 99 91  Pre FEV1/FVC % % 70 70  Post FEV1/FCV % % 69 67  FEV1-Pre L 1.92 1.78  FEV1-Predicted Pre % 85 78  FEV1-Post L 2.00 1.83  DLCO uncorrected ml/min/mmHg 19.95 16.97  DLCO [facility]% % 107 91  DLCO corrected ml/min/mmHg 19.95 16.97  DLCO COR %Predicted % 107 91  DLVA Predicted % 105 98  TLC L 5.00 4.48  TLC % Predicted % 105 94  RV % Predicted % 105 99   2021- mild obstruction, no bronchodilator reversibility.  No restriction, hyperinflation, or air trapping.  Normal diffusion.  2022 - mild obstruction present.   Assessment & Plan:     ICD-10-CM   1. Moderate persistent asthma without complication  123456 albuterol (PROVENTIL) (2.5 MG/3ML) 0.083% nebulizer solution    Albuterol Sulfate (PROAIR  RESPICLICK) 123XX123 (90 Base) MCG/ACT AEPB   Asthma -Try breztri twice daily.  Continue rinse her mouth after every use. 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 At prior visit, spoke with the patient and daughter at length that these fibrotic changes are likely from Covid and we will monitor for any concerns of disease progression but this will not likely follow the path of UIP or true pulmonary fibrosis. - PFTs without restrictive or diffsuion defects in 08/2019 and remain stable today  Snoring - Concern for underlying obstructive sleep apnea, home sleep study on hold for now until other medical bills are paid  Allergic rhinosinusitis -Continue Zyrtec - flonase daily  - azelastine nasal spray daily  Follow up in 4 months.  Freda Jackson, MD [facility] Office: [phone]     Current Outpatient Medications:  .  azelastine (ASTELIN) 0.1 % nasal spray, Place 1 spray into both nostrils 2 (two) times daily. Use in each nostril as directed, Disp: 30 mL, Rfl: 12 .  budesonide-formoterol (SYMBICORT)  160-4.5 MCG/ACT inhaler, Inhale 2 puffs into the lungs 2 (two) times daily., Disp: 1 Inhaler, Rfl: 11 .  busPIRone (BUSPAR) 15 MG tablet, TAKE ONE TABLET BY MOUTH THREE TIMES A DAY, Disp: 270 tablet, Rfl: 1 .  cetirizine (ZYRTEC) 10 MG tablet, Take 1 tablet (10 mg total) by mouth daily., Disp: 90 tablet, Rfl: 1 .  Cholecalciferol (VITAMIN D-3 PO), Take 10,000 Units by mouth daily. , Disp: , Rfl:  .  colestipol (COLESTID) 1 g tablet, Take 2 tablets (2 g total) by mouth 2 (two) times daily., Disp: 360 tablet, Rfl: 3 .  diazepam (VALIUM) 5 MG tablet, TAKE ONE TABLET BY MOUTH EVERY 6 HOURS AS NEEDED FOR ANXIETY, Disp: 120 tablet, Rfl: 5 .  dicyclomine (BENTYL) 20 MG tablet, TAKE ONE TABLET BY MOUTH FOUR TIMES A DAY AS NEEDED FOR SPASMS, Disp: 120 tablet, Rfl: 3 .  doxazosin (CARDURA) 4 MG tablet, TAKE TWO TABLETS BY MOUTH EVERY NIGHT AT BEDTIME, Disp: 180 tablet, Rfl: 1 .  famotidine (PEPCID) 20 MG tablet, TAKE ONE TABLET BY MOUTH TWICE A DAY, Disp: 180 tablet, Rfl: 3 .  ferrous sulfate 325 (65 FE) MG EC tablet, Take 325 mg by mouth 2 (two) times a week., Disp: , Rfl:  .  fluticasone (FLONASE) 50 MCG/ACT nasal spray, SPRAY TWO SPRAYS IN EACH NOSTRIL TWICE DAILY, Disp: 96 mL, Rfl: 2 .  mirtazapine (REMERON) 15 MG tablet, TAKE ONE TABLET BY MOUTH EVERY NIGHT AT BEDTIME, Disp: 90 tablet, Rfl: 0 .  montelukast (SINGULAIR) 10 MG tablet, TAKE ONE TABLET BY MOUTH AT BEDTIME, Disp: 90 tablet, Rfl: 1 .  Multiple Vitamin (MULTIVITAMIN WITH MINERALS) TABS tablet, Take 1 tablet by mouth daily., Disp: , Rfl:  .  omega-3 acid ethyl esters (LOVAZA) 1 g capsule, Take by mouth daily., Disp: , Rfl:  .  pantoprazole (PROTONIX) 40 MG tablet, Take 1 tablet (40 mg total) by mouth 2 (two) times daily., Disp: 180 tablet, Rfl: 3 .  albuterol (PROVENTIL) (2.5 MG/3ML) 0.083% nebulizer solution, Take 3 mLs (2.5 mg total) by nebulization every 6 (six) hours as needed for wheezing or shortness of breath., Disp: 75 mL, Rfl: 12 .   Albuterol Sulfate (PROAIR RESPICLICK) 123XX123 (90 Base) MCG/ACT AEPB, Inhale 2 puffs into the lungs every 6 (six) hours as needed., Disp: 1 each, Rfl: 6  Current Facility-Administered Medications:  .  0.9 %  sodium chloride infusion, 500 mL, Intravenous, Once, Thornton Park, MD

## 2020-11-07 ENCOUNTER — Other Ambulatory Visit: Payer: Self-pay | Admitting: Gastroenterology

## 2020-11-07 DIAGNOSIS — E739 Lactose intolerance, unspecified: Secondary | ICD-10-CM

## 2020-11-07 DIAGNOSIS — R140 Abdominal distension (gaseous): Secondary | ICD-10-CM

## 2020-11-07 DIAGNOSIS — K6389 Other specified diseases of intestine: Secondary | ICD-10-CM

## 2020-11-07 DIAGNOSIS — K219 Gastro-esophageal reflux disease without esophagitis: Secondary | ICD-10-CM

## 2020-11-07 DIAGNOSIS — R197 Diarrhea, unspecified: Secondary | ICD-10-CM

## 2020-11-07 NOTE — Telephone Encounter (Signed)
REFERRAL - [facility]  Location where fax was sent: [facility]  Documents Faxed: Referral, insurance card, demos, office notes, endoscopy reports, paths, labs,  Confirmation received: Yes Documents and confirmation held for future reference: Yes

## 2020-11-17 ENCOUNTER — Telehealth: Payer: Self-pay | Admitting: Pulmonary Disease

## 2020-11-17 NOTE — Telephone Encounter (Signed)
Checked RX to see if the DX code had been written on the RX, it has. PA for albuterol has been started on [email or web address]. Key is [id number]. Will check back later for response.

## 2020-11-22 NOTE — Telephone Encounter (Signed)
Checked CMM and medication, albuterol neb, has been denied. I called the [facility] and spoke with April. She states they were able to get the medication approved on their end without the need for a PA. Pt picked up medication already and was $0.74. Nothing further needed at this time.

## 2020-11-30 ENCOUNTER — Encounter: Payer: Self-pay | Admitting: Psychiatry

## 2020-11-30 ENCOUNTER — Other Ambulatory Visit: Payer: Self-pay

## 2020-11-30 ENCOUNTER — Ambulatory Visit: Payer: PPO | Admitting: Psychiatry

## 2020-11-30 DIAGNOSIS — F3162 Bipolar disorder, current episode mixed, moderate: Secondary | ICD-10-CM | POA: Diagnosis not present

## 2020-11-30 DIAGNOSIS — F4001 Agoraphobia with panic disorder: Secondary | ICD-10-CM

## 2020-11-30 DIAGNOSIS — F4310 Post-traumatic stress disorder, unspecified: Secondary | ICD-10-CM | POA: Diagnosis not present

## 2020-11-30 DIAGNOSIS — F515 Nightmare disorder: Secondary | ICD-10-CM | POA: Diagnosis not present

## 2020-11-30 DIAGNOSIS — F4312 Post-traumatic stress disorder, chronic: Secondary | ICD-10-CM | POA: Diagnosis not present

## 2020-11-30 MED ORDER — DIAZEPAM 5 MG PO TABS: 5.0000 mg | ORAL_TABLET | Freq: Four times a day (QID) | ORAL | 5 refills | Status: DC | PRN

## 2020-11-30 MED ORDER — BUSPIRONE HCL 15 MG PO TABS: 15.0000 mg | ORAL_TABLET | Freq: Three times a day (TID) | ORAL | 1 refills | Status: DC

## 2020-11-30 MED ORDER — DOXAZOSIN MESYLATE 4 MG PO TABS: 8.0000 mg | ORAL_TABLET | Freq: Every evening | ORAL | 1 refills | Status: DC

## 2020-11-30 MED ORDER — MIRTAZAPINE 15 MG PO TABS: 15.0000 mg | ORAL_TABLET | Freq: Every day | ORAL | 1 refills | Status: DC
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
## 2020-12-12 ENCOUNTER — Telehealth: Payer: Self-pay | Admitting: Pulmonary Disease

## 2020-12-12 NOTE — Telephone Encounter (Signed)
Received fax from [facility] and [place] stating the medication pt applied for has been shipped to her home. Fax does not indicate what med it is. I called the pt. She thinks this is for Symbicort, and she will call us if the [place] sample works better so we can apply for assistance for that.

## 2020-12-28 ENCOUNTER — Telehealth: Payer: Self-pay | Admitting: Pharmacist

## 2020-12-28 NOTE — Chronic Care Management (AMB) (Signed)
    Chronic Care Management Pharmacy Assistant   Name: Shaniquia Brafford  MRN: [id number] DOB: [birth year]  Reason for Encounter: Patient Assistance Coordination  12/28/2020- Patient assistance application filled out for [facility] with [place] patient assistance program. Application mailed to patient with instructions to take application to Dr Freda Jackson to sign and fax.   Medications: Outpatient Encounter Medications as of 12/28/2020  Medication Sig   albuterol (PROVENTIL) (2.5 MG/3ML) 0.083% nebulizer solution Take 3 mLs (2.5 mg total) by nebulization every 6 (six) hours as needed for wheezing or shortness of breath.   Albuterol Sulfate (PROAIR RESPICLICK) 322 (90 Base) MCG/ACT AEPB Inhale 2 puffs into the lungs every 6 (six) hours as needed.   azelastine (ASTELIN) 0.1 % nasal spray Place 1 spray into both nostrils 2 (two) times daily. Use in each nostril as directed   Budeson-Glycopyrrol-Formoterol (BREZTRI AEROSPHERE) 160-9-4.8 MCG/ACT AERO Inhale 2 puffs into the lungs in the morning and at bedtime.   busPIRone (BUSPAR) 15 MG tablet Take 1 tablet (15 mg total) by mouth 3 (three) times daily.   cetirizine (ZYRTEC) 10 MG tablet Take 1 tablet (10 mg total) by mouth daily.   Cholecalciferol (VITAMIN D-3 PO) Take 10,000 Units by mouth daily.    colestipol (COLESTID) 1 g tablet Take 2 tablets (2 g total) by mouth 2 (two) times daily.   diazepam (VALIUM) 5 MG tablet Take 1 tablet (5 mg total) by mouth every 6 (six) hours as needed. for anxiety   dicyclomine (BENTYL) 20 MG tablet TAKE 1 TABLET BY MOUTH FOUR TIMES A DAY AS NEEDED FOR SPASMS   doxazosin (CARDURA) 4 MG tablet Take 2 tablets (8 mg total) by mouth at bedtime.   famotidine (PEPCID) 20 MG tablet TAKE ONE TABLET BY MOUTH TWICE A DAY   ferrous sulfate 325 (65 FE) MG EC tablet Take 325 mg by mouth 2 (two) times a week.   fluticasone (FLONASE) 50 MCG/ACT nasal spray SPRAY TWO SPRAYS IN EACH NOSTRIL TWICE DAILY   mirtazapine  (REMERON) 15 MG tablet Take 1 tablet (15 mg total) by mouth at bedtime.   montelukast (SINGULAIR) 10 MG tablet TAKE ONE TABLET BY MOUTH AT BEDTIME   Multiple Vitamin (MULTIVITAMIN WITH MINERALS) TABS tablet Take 1 tablet by mouth daily.   omega-3 acid ethyl esters (LOVAZA) 1 g capsule Take by mouth daily.   pantoprazole (PROTONIX) 40 MG tablet Take 1 tablet (40 mg total) by mouth 2 (two) times daily.   Facility-Administered Encounter Medications as of 12/28/2020  Medication   0.9 %  sodium chloride infusion    Care Gaps: COLONOSCOPY (Pts 45-62yrs Insurance coverage will need to be confirmed)- Overdue since 04/09/2020 (Every 2 Years) COVID-19 Vaccine (4 - Booster for [employer] series) PNA vac Low Risk Adult (2 of 2 - PPSV23)  Annual Wellness Visit scheduled for 04/11/2021.  Star Rating Drugs: None    Pattricia Boss, [place] Pharmacist Assistant [phone]

## 2021-01-02 ENCOUNTER — Other Ambulatory Visit: Payer: Self-pay

## 2021-01-02 ENCOUNTER — Telehealth: Payer: Self-pay | Admitting: Psychiatry

## 2021-01-02 DIAGNOSIS — F515 Nightmare disorder: Secondary | ICD-10-CM

## 2021-01-02 DIAGNOSIS — F4312 Post-traumatic stress disorder, chronic: Secondary | ICD-10-CM

## 2021-01-02 DIAGNOSIS — F4310 Post-traumatic stress disorder, unspecified: Secondary | ICD-10-CM

## 2021-01-02 MED ORDER — MIRTAZAPINE 30 MG PO TABS: 30.0000 mg | ORAL_TABLET | Freq: Every day | ORAL | 0 refills | Status: DC

## 2021-01-02 NOTE — Telephone Encounter (Signed)
Have you discussed this increase with her?Let me know what to send

## 2021-01-02 NOTE — Telephone Encounter (Signed)
Rx sent 

## 2021-01-02 NOTE — Telephone Encounter (Signed)
Ok to send

## 2021-01-02 NOTE — Telephone Encounter (Signed)
Pt called and said that she has increased the remeron to 30 mg which helps her sleep. Please send in a new script of 30 mg to the [facility] on [address]

## 2021-01-10 ENCOUNTER — Other Ambulatory Visit: Payer: Self-pay

## 2021-01-10 MED ORDER — MONTELUKAST SODIUM 10 MG PO TABS: 10.0000 mg | ORAL_TABLET | Freq: Every day | ORAL | 1 refills | Status: DC

## 2021-01-11 ENCOUNTER — Other Ambulatory Visit: Payer: Self-pay

## 2021-01-11 ENCOUNTER — Ambulatory Visit (AMBULATORY_SURGERY_CENTER): Payer: PPO

## 2021-01-11 VITALS — Ht 62.0 in | Wt 169.0 lb

## 2021-01-11 DIAGNOSIS — D132 Benign neoplasm of duodenum: Secondary | ICD-10-CM

## 2021-01-11 NOTE — Progress Notes (Signed)
Patient's pre-visit was done today over the phone with the patient   Name,DOB and address verified.   Patient denies any allergies to Eggs and Soy. Patient denies any problems with anesthesia/sedation. Patient denies taking diet pills or blood thinners. No home Oxygen. Packet of Prep instructions mailed to patient including a copy of a consent form-pt is aware. Patient understands to call us back with any questions or concerns. Patient is aware of our care-partner policy and 0000000 safety protocol.

## 2021-01-17 ENCOUNTER — Telehealth: Payer: Self-pay | Admitting: Pulmonary Disease

## 2021-01-17 NOTE — Telephone Encounter (Signed)
If she has not noticed benefit from the breztri then it is ok to go back to the symbicort.  Thanks, Wille Glaser

## 2021-01-17 NOTE — Telephone Encounter (Signed)
Called spoke with patient.  Let her know Dr. Lisabeth Pick recommendations.  Removed Breztri off her medication list.  Told patient to call when she needed refills as she has 3-4 supply at the house.   Nothing further needed at this time.

## 2021-01-17 NOTE — Telephone Encounter (Signed)
Called and spoke to patient.  States she has been on [place] for 1.5 months and does not feel like it is helping.  She states her voice is always raspy and loses it at times, she hears a rattle in her cough, and she feels heavy in her chest since starting the [place] almost daily.   She liked being on the symbicort. She still has 3-4 months of it and wants to know if she can go back on it.   Sending to Dr. Erin Fulling for further recommendations.

## 2021-01-24 ENCOUNTER — Encounter: Payer: Self-pay | Admitting: Certified Registered Nurse Anesthetist

## 2021-01-24 DIAGNOSIS — D689 Coagulation defect, unspecified: Secondary | ICD-10-CM | POA: Insufficient documentation

## 2021-01-25 ENCOUNTER — Encounter: Payer: Self-pay | Admitting: Gastroenterology

## 2021-01-25 ENCOUNTER — Other Ambulatory Visit: Payer: Self-pay

## 2021-01-25 ENCOUNTER — Ambulatory Visit (AMBULATORY_SURGERY_CENTER): Payer: PPO | Admitting: Gastroenterology

## 2021-01-25 VITALS — BP 129/85 | HR 81 | Temp 97.5°F | Resp 9 | Ht 62.0 in | Wt 169.0 lb

## 2021-01-25 DIAGNOSIS — K3189 Other diseases of stomach and duodenum: Secondary | ICD-10-CM | POA: Diagnosis not present

## 2021-01-25 DIAGNOSIS — J45909 Unspecified asthma, uncomplicated: Secondary | ICD-10-CM | POA: Diagnosis not present

## 2021-01-25 DIAGNOSIS — D132 Benign neoplasm of duodenum: Secondary | ICD-10-CM | POA: Diagnosis not present

## 2021-01-25 DIAGNOSIS — K219 Gastro-esophageal reflux disease without esophagitis: Secondary | ICD-10-CM | POA: Diagnosis not present

## 2021-01-25 MED ORDER — SODIUM CHLORIDE 0.9 % IV SOLN: 500.0000 mL | Freq: Once | INTRAVENOUS | Status: DC

## 2021-01-25 NOTE — Progress Notes (Signed)
Vs by cw.  Previsit with TM

## 2021-01-25 NOTE — Progress Notes (Signed)
0910 Robinul 0.1 mg IV given due large amount of secretions upon assessment.  MD made aware, vss 

## 2021-01-25 NOTE — Progress Notes (Signed)
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    PLAN: EGD to insure complete removal of duodenal polyp 01/2021    HPI: Brittany [name] is a 65 y.o. female who presents for upper endoscopy. She has been experiencing episodes of severe abdominal pain and cramping described as a hot pit in her stomach, 2-10 daily bowel movements, bloating, early satiety, eructation, nausea, and gas that seems to be triggered by dairy products, sugars, or sugar substitutes including sugar alcohols. Has to lie in the fetal position with severe symptoms.  No noted association with mammalian meats. Bothered by a large amount of ongoing gas. No change in symptoms with eating or movement. Switching to plant-based dairy products has provided some relief. Requires postprandial Imodium and Lactaid to leave the house. Exacerbation in all symptoms after having Covid 04/2019 and with stress and anxiety.   History of evaluation and attempts at symptom management: - Symptoms initially started 16 years ago following cholecystectomy for cholelithiasis - Prior diagnosis of IBS with Dr. Collene Mares, treated with Align - Endoscopic evaluation 04/09/2018 revealed pancolonic diverticulosis and was otherwise normal. Gastric biopsy showed chronic inactive gastritis with no evidence of H. pylori.  Duodenum biopsies and random colon biopsies were normal. - Incomplete response to colestipol and initially did not tolerate a dose increase to 2 BID due to severe constipation. Since resuming colestipol, her stools are more formed. She has been titrating the dose to help control her symptoms.  - Two weeks of Xifaxan provided significant relief in her symptoms. She had no significant bloating or diarrhea during this time.  However all symptoms returned within 2 weeks  of completing treatment.  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  Increase in hydrogen was 12 ppm, increase in methane 3 ppm, increase in combined hydrogen and methane 15 ppm which is the cutoff for diagnosis. - Possible SIBO retreated with doxycycline 100 mg BID x 14 days.  Initially felt better, but, symptoms recurred within 2 weeks.  - Seen by nutritionist but the interaction was not very helpful. Tried a low FODMAP diet but it is difficult due to complexity and cost. The nutritionist did not recommend any additional follow-up because she wasn't following the diet recommendation. - Normal CBC and BMP 02/11/20   - Repeat SIBO hydrogen and methane breath results 04/04/2020 showed an increase in hydrogen by 18 ppm, increase in methane by 4 ppm and a total increase in combined hydrogen and methane by 22 ppm.  However, most of this happened after 100 minutes which is not consistent with SIBO. - Labs 04/06/20: hemoglobin 15.3, platelets 273, MCV 94.6, RDW 12.8, iron 78, ferritin 36, % sat 21 - Labs 05/26/2020: Stool negative for Cryptosporidium and Giardia; fecal calprotectin 19, pancreatic elastase >500 (normal) - Unable to tolerate peppermint due to heartburn.   - Unable to trial FDGard due to cost. - 4-4-4 sucrose challenge positive, suggesting disaccharide intolerance - EGD 08/08/20: Gastritis, duodenal polyp with pathology showing adenoma   Prior endoscopy: - Upper endoscopy and colonoscopy 04/09/2018  revealed pancolonic diverticulosis and was otherwise normal. Gastric biopsy showed chronic inactive gastritis with no evidence of H. pylori.  Duodenum biopsies and random colon biopsies were normal - EGD 08/08/20: Gastritis, duodenal polyp with pathology showing  adenoma  Past Medical History:  Diagnosis Date   Abnormal EKG    Allergy    Anxiety     Asthma    Bipolar depression ([place]), Anxiety, PTSD, Panic disorder    -managed by [facility]   Depression    Foot pain    GERD (gastroesophageal reflux disease)    Heart murmur    as a child   Insomnia    Leg swelling    Osteoporosis    osteopenia   Tachycardia     Past Surgical History:  Procedure Laterality Date   BREAST BIOPSY Right    biopsy of nipple   CARPAL TUNNEL RELEASE Left    CESAREAN SECTION     x 2   CHOLECYSTECTOMY     COLONOSCOPY     ENDOMETRIAL ABLATION     KNEE SURGERY Right    reconstruction for patellar dislocation   LAPAROSCOPY     x 2   TUBAL LIGATION     ULNAR NERVE REPAIR Left    UPPER GASTROINTESTINAL ENDOSCOPY      Current Outpatient Medications  Medication Sig Dispense Refill   albuterol (PROVENTIL) (2.5 MG/3ML) 0.083% nebulizer solution Take 3 mLs (2.5 mg total) by nebulization every 6 (six) hours as needed for wheezing or shortness of breath. 75 mL 12   Albuterol Sulfate (PROAIR RESPICLICK) 123XX123 (90 Base) MCG/ACT AEPB Inhale 2 puffs into the lungs every 6 (six) hours as needed. 1 each 6   Ascorbic Acid (VITAMIN C) 500 MG CHEW      azelastine (ASTELIN) 0.1 % nasal spray Place 1 spray into both nostrils 2 (two) times daily. Use in each nostril as directed 30 mL 12   busPIRone (BUSPAR) 15 MG tablet Take 1 tablet (15 mg total) by mouth 3 (three) times daily. 270 tablet 1   cetirizine (ZYRTEC) 10 MG tablet Take 1 tablet (10 mg total) by mouth daily. 90 tablet 1   Cholecalciferol (VITAMIN D-3 PO) Take 10,000 Units by mouth daily.      colestipol (COLESTID) 1 g tablet Take 2 tablets (2 g total) by mouth 2 (two) times daily. 360 tablet 3   diazepam (VALIUM) 5 MG tablet Take 1 tablet (5 mg total) by mouth every 6 (six) hours as needed. for anxiety 120 tablet 5   dicyclomine (BENTYL) 20 MG tablet TAKE 1 TABLET BY MOUTH FOUR TIMES A DAY AS NEEDED FOR SPASMS 360 tablet 3   doxazosin (CARDURA) 4 MG tablet Take 2 tablets (8 mg total) by mouth at  bedtime. 180 tablet 1   famotidine (PEPCID) 20 MG tablet TAKE ONE TABLET BY MOUTH TWICE A DAY 180 tablet 3   ferrous sulfate 325 (65 FE) MG EC tablet Take 325 mg by mouth 2 (two) times a week.     fluticasone (FLONASE) 50 MCG/ACT nasal spray SPRAY TWO SPRAYS IN EACH NOSTRIL TWICE DAILY 96 mL 2   MAGNESIUM PO      mirtazapine (REMERON) 30 MG tablet Take 1 tablet (30 mg total) by mouth at bedtime. 30 tablet 0   montelukast (SINGULAIR) 10 MG tablet Take 1 tablet (10 mg total) by mouth at bedtime. 90 tablet 1   Multiple Vitamin (MULTIVITAMIN WITH MINERALS) TABS tablet Take 1 tablet by mouth daily.     omega-3 acid ethyl esters (LOVAZA) 1 g capsule Take by mouth daily.     pantoprazole (PROTONIX) 40 MG tablet Take 1 tablet (40 mg total) by mouth 2 (two) times daily. 180 tablet 3  Zinc 50 MG TABS      Current Facility-Administered Medications  Medication Dose Route Frequency Provider Last Rate Last Admin   0.9 %  sodium chloride infusion  500 mL Intravenous Once Thornton Park, MD       0.9 %  sodium chloride infusion  500 mL Intravenous Once Thornton Park, MD        Allergies as of 01/25/2021 - Review Complete 01/25/2021  Allergen Reaction Noted   Aspirin Other (See Comments)    Codeine Itching 09/26/2017   Niacin and related Itching 01/11/2021   Sulfamethoxazole-trimethoprim Other (See Comments)    Topiramate Other (See Comments) 10/25/2020   Trileptal [oxcarbazepine]  11/28/2018   Trimethoprim Other (See Comments) 11/12/2014   Ultram [tramadol] Itching 09/26/2017   Vioxx [rofecoxib]      Family History  Problem Relation Age of Onset   Heart failure Mother    Hypertension Mother    Hyperlipidemia Mother    Congestive Heart Failure Mother    Other Father        killed   COPD Paternal 3        a lot of aunts and uncle COPD or Emphysema   Emphysema Paternal Uncle    Colon polyps Maternal Aunt    COPD Brother    Heart disease Brother    Pulmonary embolism Brother     Breast cancer Neg Hx    Colon cancer Neg Hx    Esophageal cancer Neg Hx    Pancreatic cancer Neg Hx    Stomach cancer Neg Hx    Liver disease Neg Hx    Rectal cancer Neg Hx      Physical Exam: General:   Alert,  well-nourished, pleasant and cooperative in NAD.  Eyes are open. Head:  Normocephalic and atraumatic. Eyes:  Sclera clear, no icterus.   Conjunctiva pink. Abdomen:  Soft, nontender, nondistended, normal bowel sounds, no rebound or guarding. No hepatosplenomegaly.  No obvious ascites.  No abdominal wall hernias.  No succession splash. Neurologic:  Alert and  oriented x4;  grossly nonfocal Skin:  Intact without significant lesions or rashes. Psych:  Alert and cooperative. Normal mood and affect.   Anahid Eskelson L. Tarri Glenn, MD, MPH [facility] 01/25/2021, 9:11 AM

## 2021-01-25 NOTE — Op Note (Signed)
[facility] Patient Name: Brittany [name] Procedure Date: 01/25/2021 9:05 AM MRN: [id number] Endoscopist: Thornton Park MD, MD Age: 65 Referring MD:  Date of Birth: 02/18/1956 Gender: Female Account #: [id number] Procedure:                Upper GI endoscopy Indications:              Follow-up of benign duodenal tumor - dudoenal                            adenoma removed on upper endoscopy 08/08/20                           Repeat exam recommended in 3-6 months to document                            complete resection. Medicines:                Monitored Anesthesia Care Procedure:                Pre-Anesthesia Assessment:                           - Prior to the procedure, a History and Physical                            was performed, and patient medications and                            allergies were reviewed. The patient's tolerance of                            previous anesthesia was also reviewed. The risks                            and benefits of the procedure and the sedation                            options and risks were discussed with the patient.                            All questions were answered, and informed consent                            was obtained. Prior Anticoagulants: The patient has                            taken no previous anticoagulant or antiplatelet                            agents. ASA Grade Assessment: II - A patient with                            mild systemic disease. After reviewing the risks  and benefits, the patient was deemed in                            satisfactory condition to undergo the procedure.                           After obtaining informed consent, the endoscope was                            passed under direct vision. Throughout the                            procedure, the patient's blood pressure, pulse, and                            oxygen saturations were monitored  continuously. The                            Endoscope was introduced through the mouth, and                            advanced to the third part of duodenum. The upper                            GI endoscopy was accomplished without difficulty.                            The patient tolerated the procedure well. Scope In: Scope Out: Findings:                 The esophagus is normal. The Z-line was regular and                            was found 35 cm from the incisors.                           A few small sessile polyps were found in the                            gastric fundus. The stomach is otherwise normal.                           Residual adenoma, with no bleeding was found in the                            duodenal bulb. The polyp was removed with a cold                            snare but the margins of the polypectomy site were                            irregular. The margins were there cleared with cold  forceps to maximize a complete resection. Estimated                            blood loss was minimal.                           The cardia and gastric fundus were normal on                            retroflexion.                           The exam was otherwise without abnormality. Complications:            No immediate complications. Estimated Blood Loss:     Estimated blood loss: none. Impression:               - Z-line regular, 35 cm from the incisors.                           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                           - Known duodenal bulb adenoma. Residual was removed                            today.                           - The examination was otherwise normal. Recommendation:           - Patient has a contact number available for                            emergencies. The signs and symptoms of potential                            delayed complications were  discussed with the                            patient. Return to normal activities tomorrow.                            Written discharge instructions were provided to the                            patient.                           - Resume previous diet.                           - Continue present medications.                           -  Await pathology results.                           - Repeat upper endoscopy in 6 months surveillance. Thornton Park MD, MD 01/25/2021 9:37:28 AM This report has been signed electronically.

## 2021-01-25 NOTE — Progress Notes (Signed)
Report given to PACU, vss 

## 2021-01-25 NOTE — Progress Notes (Signed)
Called to room to assist during endoscopic procedure.  Patient ID and intended procedure confirmed with present staff. Received instructions for my participation in the procedure from the performing physician.  

## 2021-01-25 NOTE — Patient Instructions (Signed)
Resume previous diet and activities. Awaiting pathology results. Repeat upper endoscopy in 6 months for surveillance.  YOU HAD AN ENDOSCOPIC PROCEDURE TODAY AT [facility]:   Refer to the procedure report that was given to you for any specific questions about what was found during the examination.  If the procedure report does not answer your questions, please call your gastroenterologist to clarify.  If you requested that your care partner not be given the details of your procedure findings, then the procedure report has been included in a sealed envelope for you to review at your convenience later.  YOU SHOULD EXPECT: Some feelings of bloating in the abdomen. Passage of more gas than usual.  Walking can help get rid of the air that was put into your GI tract during the procedure and reduce the bloating. If you had a lower endoscopy (such as a colonoscopy or flexible sigmoidoscopy) you may notice spotting of blood in your stool or on the toilet paper. If you underwent a bowel prep for your procedure, you may not have a normal bowel movement for a few days.  Please Note:  You might notice some irritation and congestion in your nose or some drainage.  This is from the oxygen used during your procedure.  There is no need for concern and it should clear up in a day or so.  SYMPTOMS TO REPORT IMMEDIATELY:   Following upper endoscopy (EGD)  Vomiting of blood or coffee ground material  New chest pain or pain under the shoulder blades  Painful or persistently difficult swallowing  New shortness of breath  Fever of 100F or higher  Black, tarry-looking stools  For urgent or emergent issues, a gastroenterologist can be reached at any hour by calling [phone]. Do not use MyChart messaging for urgent concerns.    DIET:  We do recommend a small meal at first, but then you may proceed to your regular diet.  Drink plenty of fluids but you should avoid alcoholic beverages for 24  hours.  ACTIVITY:  You should plan to take it easy for the rest of today and you should NOT DRIVE or use heavy machinery until tomorrow (because of the sedation medicines used during the test).    FOLLOW UP: Our staff will call the number listed on your records 48-72 hours following your procedure to check on you and address any questions or concerns that you may have regarding the information given to you following your procedure. If we do not reach you, we will leave a message.  We will attempt to reach you two times.  During this call, we will ask if you have developed any symptoms of COVID 19. If you develop any symptoms (ie: fever, flu-like symptoms, shortness of breath, cough etc.) before then, please call [phone].  If you test positive for Covid 19 in the 2 weeks post procedure, please call and report this information to [place].    If any biopsies were taken you will be contacted by phone or by letter within the next 1-3 weeks.  Please call us at [phone] if you have not heard about the biopsies in 3 weeks.    SIGNATURES/CONFIDENTIALITY: You and/or your care partner have signed paperwork which will be entered into your electronic medical record.  These signatures attest to the fact that that the information above on your After Visit Summary has been reviewed and is understood.  Full responsibility of the confidentiality of this discharge information lies with you  and/or your care-partner.

## 2021-01-27 ENCOUNTER — Telehealth: Payer: Self-pay

## 2021-01-27 NOTE — Telephone Encounter (Signed)
NO ANSWER, MESSAGE LEFT FOR PATIENT. 

## 2021-01-27 NOTE — Telephone Encounter (Signed)
Second follow up call attempt, no answer, LM

## 2021-02-03 ENCOUNTER — Telehealth: Payer: Self-pay | Admitting: Family Medicine

## 2021-02-03 NOTE — Progress Notes (Signed)
  Chronic Care Management   Note  02/03/2021 Name: Brittany [name] MRN: [id number] DOB: 04-Dec-1955  Brittany [name] is a 65 y.o. year old female who is a primary care patient of Koberlein, Steele Berg, MD. I reached out to [employer] by phone today in response to a referral sent by [name]'s PCP, Caren Macadam, MD.   [name] was given information about Chronic Care Management services today including:  CCM service includes personalized support from designated clinical staff supervised by her physician, including individualized plan of care and coordination with other care providers 24/7 contact phone numbers for assistance for urgent and routine care needs. Service will only be billed when office clinical staff spend 20 minutes or more in a month to coordinate care. Only one practitioner may furnish and bill the service in a calendar month. The patient may stop CCM services at any time (effective at the end of the month) by phone call to the office staff.   Patient agreed to services and verbal consent obtained.   Follow up plan:   Tatjana Secretary/administrator

## 2021-02-05 ENCOUNTER — Other Ambulatory Visit: Payer: Self-pay | Admitting: Psychiatry

## 2021-02-05 DIAGNOSIS — F515 Nightmare disorder: Secondary | ICD-10-CM

## 2021-02-05 DIAGNOSIS — F4310 Post-traumatic stress disorder, unspecified: Secondary | ICD-10-CM

## 2021-02-15 ENCOUNTER — Other Ambulatory Visit: Payer: Self-pay | Admitting: Critical Care Medicine

## 2021-02-27 ENCOUNTER — Ambulatory Visit: Payer: PPO

## 2021-02-28 ENCOUNTER — Other Ambulatory Visit: Payer: Self-pay

## 2021-02-28 ENCOUNTER — Ambulatory Visit (INDEPENDENT_AMBULATORY_CARE_PROVIDER_SITE_OTHER): Payer: PPO

## 2021-02-28 DIAGNOSIS — Z23 Encounter for immunization: Secondary | ICD-10-CM | POA: Diagnosis not present

## 2021-02-28 NOTE — Progress Notes (Signed)
Per orders from Dr Ethlyn Gallery, pt was given influenza vaccine by Madaline Guthrie, pt tolerated well.

## 2021-03-02 ENCOUNTER — Telehealth: Payer: Self-pay

## 2021-03-02 DIAGNOSIS — R0781 Pleurodynia: Secondary | ICD-10-CM | POA: Diagnosis not present

## 2021-03-02 NOTE — Telephone Encounter (Signed)
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

## 2021-03-02 NOTE — Telephone Encounter (Signed)
Patient called back and stated she will go to an Urgent Care to get treated.

## 2021-03-03 NOTE — Telephone Encounter (Signed)
noted 

## 2021-03-14 ENCOUNTER — Telehealth (INDEPENDENT_AMBULATORY_CARE_PROVIDER_SITE_OTHER): Payer: PPO | Admitting: Family Medicine

## 2021-03-14 ENCOUNTER — Encounter: Payer: Self-pay | Admitting: Family Medicine

## 2021-03-14 VITALS — Temp 97.0°F

## 2021-03-14 DIAGNOSIS — R062 Wheezing: Secondary | ICD-10-CM

## 2021-03-14 DIAGNOSIS — R0981 Nasal congestion: Secondary | ICD-10-CM | POA: Diagnosis not present

## 2021-03-14 DIAGNOSIS — R059 Cough, unspecified: Secondary | ICD-10-CM | POA: Diagnosis not present

## 2021-03-14 MED ORDER — AMOXICILLIN-POT CLAVULANATE 875-125 MG PO TABS: 1.0000 | ORAL_TABLET | Freq: Two times a day (BID) | ORAL | 0 refills | Status: DC

## 2021-03-14 NOTE — Progress Notes (Signed)
Virtual Visit via Video Note  I connected with Brittany [name]  on 03/14/21 at 10:20 AM EDT by a video enabled telemedicine application and verified that I am speaking with the correct person using two identifiers.  Location patient: home, [place] Location provider:work or home office Persons participating in the virtual visit: patient, provider  I discussed the limitations of evaluation and management by telemedicine and the availability of in person appointments. The patient expressed understanding and agreed to proceed.   HPI:  Acute telemedicine visit for flu like symptoms: -Onset: about 10-11 days ago -3 negative covid tests at home -grandson has been sick with similar symptoms -Symptoms include: cough, congestion, fevers intermittently (last night had a temp of 100.4), diarrhea, feels tired, she is using albuterol daily, wheezing - improves with nebulizer, does have increased mucus production - sometimes clear, sometimes yellow, some SOB that she reports is mild and improves with her inhaler -Denies:CP, vomiting, inability to eat/drink/get out of bed -Has tried:albuterol and cough/cold medications, takes her singulair and symbicort -Pertinent past medical history:see below -Pertinent medication allergies:  Allergies  Allergen Reactions   Aspirin Other (See Comments)    discomfort   Codeine Itching   Niacin And Related Itching   Sulfamethoxazole-Trimethoprim Other (See Comments)    Unknown reaction per pt   Topiramate Other (See Comments)    Per patient, it causes stomach burning   Trileptal [Oxcarbazepine]     itching   Trimethoprim Other (See Comments)    Unknown, per pt   Ultram [Tramadol] Itching   Vioxx [Rofecoxib]     Unknown per pt  -COVID-19 vaccine status: covid vaccines x 3, flu shot recently  ROS: See pertinent positives and negatives per HPI.  Past Medical History:  Diagnosis Date   Abnormal EKG    Allergy    Anxiety    Asthma    Bipolar depression ([place]), Anxiety,  PTSD, Panic disorder    -managed by [facility]   Depression    Foot pain    GERD (gastroesophageal reflux disease)    Heart murmur    as a child   Insomnia    Leg swelling    Osteoporosis    osteopenia   Tachycardia     Past Surgical History:  Procedure Laterality Date   BREAST BIOPSY Right    biopsy of nipple   CARPAL TUNNEL RELEASE Left    CESAREAN SECTION     x 2   CHOLECYSTECTOMY     COLONOSCOPY     ENDOMETRIAL ABLATION     KNEE SURGERY Right    reconstruction for patellar dislocation   LAPAROSCOPY     x 2   TUBAL LIGATION     ULNAR NERVE REPAIR Left    UPPER GASTROINTESTINAL ENDOSCOPY       Current Outpatient Medications:    albuterol (PROVENTIL) (2.5 MG/3ML) 0.083% nebulizer solution, Take 3 mLs (2.5 mg total) by nebulization every 6 (six) hours as needed for wheezing or shortness of breath., Disp: 75 mL, Rfl: 12   Albuterol Sulfate (PROAIR RESPICLICK) 270 (90 Base) MCG/ACT AEPB, Inhale 2 puffs into the lungs every 6 (six) hours as needed., Disp: 1 each, Rfl: 6   Ascorbic Acid (VITAMIN C PO), Take by mouth. Super C with vitamin D, Disp: , Rfl:    azelastine (ASTELIN) 0.1 % nasal spray, Place 1 spray into both nostrils 2 (two) times daily. Use in each nostril as directed, Disp: 30 mL, Rfl: 12   BIOTIN PO, Take by mouth.,  Disp: , Rfl:    busPIRone (BUSPAR) 15 MG tablet, Take 1 tablet (15 mg total) by mouth 3 (three) times daily., Disp: 270 tablet, Rfl: 1   cetirizine (ZYRTEC) 10 MG tablet, Take 1 tablet (10 mg total) by mouth daily., Disp: 90 tablet, Rfl: 1   Cholecalciferol (VITAMIN D-3 PO), Take 10,000 Units by mouth daily. , Disp: , Rfl:    colestipol (COLESTID) 1 g tablet, Take 2 tablets (2 g total) by mouth 2 (two) times daily., Disp: 360 tablet, Rfl: 3   COLLAGEN PO, Take by mouth., Disp: , Rfl:    diazepam (VALIUM) 5 MG tablet, Take 1 tablet (5 mg total) by mouth every 6 (six) hours as needed. for anxiety, Disp: 120 tablet, Rfl: 5   dicyclomine  (BENTYL) 20 MG tablet, TAKE 1 TABLET BY MOUTH FOUR TIMES A DAY AS NEEDED FOR SPASMS, Disp: 360 tablet, Rfl: 3   doxazosin (CARDURA) 4 MG tablet, Take 2 tablets (8 mg total) by mouth at bedtime., Disp: 180 tablet, Rfl: 1   famotidine (PEPCID) 20 MG tablet, TAKE ONE TABLET BY MOUTH TWICE A DAY, Disp: 180 tablet, Rfl: 3   ferrous sulfate 325 (65 FE) MG EC tablet, Take 325 mg by mouth 2 (two) times a week., Disp: , Rfl:    fluticasone (FLONASE) 50 MCG/ACT nasal spray, SPRAY TWO SPRAYS IN EACH NOSTRIL TWICE DAILY, Disp: 96 mL, Rfl: 2   MAGNESIUM PO, Calcium citrate, magnesium and vitamin D3 combo, Disp: , Rfl:    mirtazapine (REMERON) 30 MG tablet, TAKE ONE TABLET BY MOUTH EVERY NIGHT AT BEDTIME, Disp: 30 tablet, Rfl: 0   montelukast (SINGULAIR) 10 MG tablet, Take 1 tablet (10 mg total) by mouth at bedtime., Disp: 90 tablet, Rfl: 1   Multiple Vitamin (MULTIVITAMIN WITH MINERALS) TABS tablet, Take 1 tablet by mouth daily., Disp: , Rfl:    Omega-3 Fatty Acids (FISH OIL PO), Take 1,000 mg by mouth daily., Disp: , Rfl:    pantoprazole (PROTONIX) 40 MG tablet, Take 1 tablet (40 mg total) by mouth 2 (two) times daily., Disp: 180 tablet, Rfl: 3   Zinc 50 MG TABS, , Disp: , Rfl:   Current Facility-Administered Medications:    0.9 %  sodium chloride infusion, 500 mL, Intravenous, Once, [place], MD   0.9 %  sodium chloride infusion, 500 mL, Intravenous, Once, [place], MD  EXAM:  VITALS per patient if applicable:  GENERAL: alert, oriented, appears well and in no acute distress  HEENT: atraumatic, conjunttiva clear, no obvious abnormalities on inspection of external nose and ears  NECK: normal movements of the head and neck  LUNGS: on inspection no signs of respiratory distress, breathing rate appears normal, no obvious gross SOB, gasping or wheezing  CV: no obvious cyanosis  MS: moves all visible extremities without noticeable abnormality  PSYCH/NEURO: pleasant and cooperative,  no obvious depression or anxiety, speech and thought processing grossly intact  ASSESSMENT AND PLAN:  Discussed the following assessment and plan:  Cough  Nasal congestion  Wheeze  -we discussed possible serious and likely etiologies, options for evaluation and workup, limitations of telemedicine visit vs in person visit, treatment, treatment risks and precautions. Pt is agreeable to treatment via telemedicine at this moment. She feels she needs an antibiotic. Given had a fever again last night opted to tx with abx; she prefers augmentin as is sensitive to many medications. Reports she can't take oral steroids. Wheeze/mild SOB responds to alb and advised she can use it every  4-6 hours if needed.   Advised to seek prompt in person care if worsening, new symptoms arise, or if is not improving with treatment. Discussed options for inperson care if PCP office not available. Did let this patient know that I only do telemedicine on Tuesdays and Thursdays for [place]. Advised to schedule follow up visit with PCP or [facility] if any further questions or concerns to avoid delays in care.   I discussed the assessment and treatment plan with the patient. The patient was provided an opportunity to ask questions and all were answered. The patient agreed with the plan and demonstrated an understanding of the instructions.     Brittany Kern, DO

## 2021-03-14 NOTE — Patient Instructions (Signed)
-  I sent the medication(s) we discussed to your pharmacy: Meds ordered this encounter  Medications   amoxicillin-clavulanate (AUGMENTIN) 875-125 MG tablet    Sig: Take 1 tablet by mouth 2 (two) times daily.    Dispense:  20 tablet    Refill:  0   Use albuterol every 4-6 hours as needed for wheeze and/ or asthma symptoms. If struggling to breath or symptoms not responding to your albuterol seek immediate inperson care.  I hope you are feeling better soon!  Seek in person care promptly if your symptoms worsen, new concerns arise or you are not improving with treatment.  It was nice to meet you today. I help [facility] out with telemedicine visits on Tuesdays and Thursdays and am available for visits on those days. If you have any concerns or questions following this visit please schedule a follow up visit with your Primary Care doctor or seek care at a local urgent care clinic to avoid delays in care.

## 2021-03-15 ENCOUNTER — Other Ambulatory Visit: Payer: Self-pay | Admitting: Psychiatry

## 2021-03-15 DIAGNOSIS — F4310 Post-traumatic stress disorder, unspecified: Secondary | ICD-10-CM

## 2021-03-15 DIAGNOSIS — F515 Nightmare disorder: Secondary | ICD-10-CM

## 2021-03-20 ENCOUNTER — Telehealth: Payer: Self-pay | Admitting: Family Medicine

## 2021-03-20 NOTE — Telephone Encounter (Signed)
Patient spoke to Dr. Maudie Mercury on 03/14/21 via virtual visit.  She is requesting a prescription for Prednisone via MyChart message.

## 2021-03-20 NOTE — Telephone Encounter (Signed)
Spoke with the patient and she stated oral Prednisone "makes her feel sad one minute and cannot stand herself the next minute".  Patient complains of increased shortness of breath for the past 2 weeks, current pulse oximetry reading of 95%, stated she has used a nebulizer treatment with some relief and is still struggling to breathe.  Message sent to PCP.

## 2021-03-20 NOTE — Telephone Encounter (Signed)
Dr. Maudie Mercury listed in her note that Brittany [name] cannot tolerate oral prednisone? Would need clarification of prior difficulties with prednisone and update on current status before I would feel comfortable prescribing.

## 2021-03-22 NOTE — Telephone Encounter (Signed)
Patient states she is willing to try prednisone.  Patient says it does make her emotional, but she is okay trying.  [facility]

## 2021-03-22 NOTE — Telephone Encounter (Signed)
Suggest CXR - ok to order if she can come in to complete  In past she was on additional inhalers - symbicort, brextri. Is there one she did well with? Since difficulty with oral steroid, adding back in steroid in inhaler may help with cough/breathing.

## 2021-03-22 NOTE — Telephone Encounter (Signed)
Brittany [name]. If not improving then needs in person evaluation. Since her treatment previously was virtual, I feel that is next best step if she is not improving with what Dr. Maudie Mercury treated her with. If she is still taking symbicort 160 and doing 2 puffs twice daily (this has fallen off list so please put back on so we are accurate), then that is max dose and since she cannot tolerate oral prednisone, I don't feel like we have an obvious next step without in person evaluation.

## 2021-03-22 NOTE — Telephone Encounter (Signed)
Patient informed of the message below and was advised she can go the [facility] office for an x-ray.  Patient stated she does not have the time or money to have an x-ray and has been taking Symbicort daily.  Message sent to PCP.

## 2021-03-24 NOTE — Telephone Encounter (Signed)
FYI Spoke with patient and she is "doing a lot better".  She will speak with her pulmonologist int the future for a prednisone prescription.

## 2021-03-24 NOTE — Telephone Encounter (Signed)
Since last conversation was 2 days ago; please check in with her and make sure sx are still stable. If no improvement but no worsening; ok for prednisone 40mg  daily x 5 days. If she is worse she really needs to have in person evaluation. I would set up follow up visit in 1-2 weeks so we can touch base with in office exam and make sure she is doing better.

## 2021-03-29 ENCOUNTER — Encounter: Payer: Self-pay | Admitting: Psychiatry

## 2021-03-29 ENCOUNTER — Ambulatory Visit: Payer: PPO | Admitting: Psychiatry

## 2021-03-29 ENCOUNTER — Telehealth: Payer: Self-pay | Admitting: Pharmacist

## 2021-03-29 ENCOUNTER — Other Ambulatory Visit: Payer: Self-pay

## 2021-03-29 DIAGNOSIS — F4001 Agoraphobia with panic disorder: Secondary | ICD-10-CM

## 2021-03-29 DIAGNOSIS — F3162 Bipolar disorder, current episode mixed, moderate: Secondary | ICD-10-CM | POA: Diagnosis not present

## 2021-03-29 DIAGNOSIS — F515 Nightmare disorder: Secondary | ICD-10-CM | POA: Diagnosis not present

## 2021-03-29 DIAGNOSIS — F4312 Post-traumatic stress disorder, chronic: Secondary | ICD-10-CM

## 2021-03-29 DIAGNOSIS — F4310 Post-traumatic stress disorder, unspecified: Secondary | ICD-10-CM | POA: Diagnosis not present

## 2021-03-29 MED ORDER — BUSPIRONE HCL 15 MG PO TABS: 15.0000 mg | ORAL_TABLET | Freq: Three times a day (TID) | ORAL | 1 refills | Status: DC

## 2021-03-29 MED ORDER — MIRTAZAPINE 30 MG PO TABS: 30.0000 mg | ORAL_TABLET | Freq: Every day | ORAL | 1 refills | Status: DC

## 2021-03-29 NOTE — Progress Notes (Signed)
Chronic Care Management Pharmacy Assistant   Name: Brittany [name]  MRN: [id number] DOB: [birth year]  Brittany [name] is an 65 y.o. year old female who presents for his initial CCM visit with the clinical pharmacist.  Reason for Encounter: Chart prep for initial visit with Jeni Salles the Clinical Pharmacist on 04/07/2021 at 10:30 for a face to face visit   Conditions to be addressed/monitored: HLD, COPD, and GERD, PTSD, Bipolar  Recent office visits:  03/14/2021 Colin Benton DO [facility]) - Patient was seen for cough and other issues. Started Augmentin 872/125 mg. Follow up with PCP.  10/17/2020 Alysia Penna MD (PCP) - Patient was seen for pharyngitis. No medication changes. No follow up noted.  Recent consult visits:  11/30/2020 Trula Slade MD (psychiatry) - Patient was seen for PTSD and additional issues. Changed Doxazosin 8 mg from 2 tablets qhs to 1 tablet qhs. Follow up in 3-4 months.  10/25/2020 Freda Jackson MD (pulmonary) - Patient was seen for Moderate persistent asthma without complication. Started Budeson -Glycopyrrol-Formoterol 160-9-4.8 mcg/ACT, discontinued Rifaximin. Follow up in 4 months.  10/04/2020 Thornton Park MD (gastroenterology) - Patient was seen for frequent stools. Started Rifaximin 550 mg, changed Colestipol to 2gm bid, discontinued Topiramate. No follow up noted.  Hospital visits:  None in previous 6 months  Medications: Outpatient Encounter Medications as of 03/29/2021  Medication Sig   albuterol (PROVENTIL) (2.5 MG/3ML) 0.083% nebulizer solution Take 3 mLs (2.5 mg total) by nebulization every 6 (six) hours as needed for wheezing or shortness of breath.   Albuterol Sulfate (PROAIR RESPICLICK) 973 (90 Base) MCG/ACT AEPB Inhale 2 puffs into the lungs every 6 (six) hours as needed.   Ascorbic Acid (VITAMIN C PO) Take by mouth. Super C with vitamin D   azelastine (ASTELIN) 0.1 % nasal spray Place 1 spray into both nostrils 2  (two) times daily. Use in each nostril as directed   BIOTIN PO Take by mouth.   busPIRone (BUSPAR) 15 MG tablet Take 1 tablet (15 mg total) by mouth 3 (three) times daily.   cetirizine (ZYRTEC) 10 MG tablet Take 1 tablet (10 mg total) by mouth daily.   Cholecalciferol (VITAMIN D-3 PO) Take 10,000 Units by mouth daily.    colestipol (COLESTID) 1 g tablet Take 2 tablets (2 g total) by mouth 2 (two) times daily.   COLLAGEN PO Take by mouth.   diazepam (VALIUM) 5 MG tablet Take 1 tablet (5 mg total) by mouth every 6 (six) hours as needed. for anxiety   dicyclomine (BENTYL) 20 MG tablet TAKE 1 TABLET BY MOUTH FOUR TIMES A DAY AS NEEDED FOR SPASMS   doxazosin (CARDURA) 4 MG tablet Take 2 tablets (8 mg total) by mouth at bedtime.   famotidine (PEPCID) 20 MG tablet TAKE ONE TABLET BY MOUTH TWICE A DAY   ferrous sulfate 325 (65 FE) MG EC tablet Take 325 mg by mouth 2 (two) times a week.   fluticasone (FLONASE) 50 MCG/ACT nasal spray SPRAY TWO SPRAYS IN EACH NOSTRIL TWICE DAILY   MAGNESIUM PO Calcium citrate, magnesium and vitamin D3 combo   mirtazapine (REMERON) 30 MG tablet Take 1 tablet (30 mg total) by mouth at bedtime.   montelukast (SINGULAIR) 10 MG tablet Take 1 tablet (10 mg total) by mouth at bedtime.   Multiple Vitamin (MULTIVITAMIN WITH MINERALS) TABS tablet Take 1 tablet by mouth daily.   Omega-3 Fatty Acids (FISH OIL PO) Take 1,000 mg by mouth daily.   pantoprazole (PROTONIX) 40  MG tablet Take 1 tablet (40 mg total) by mouth 2 (two) times daily.   Zinc 50 MG TABS    Facility-Administered Encounter Medications as of 03/29/2021  Medication   0.9 %  sodium chloride infusion   0.9 %  sodium chloride infusion   Fill History:  ALBUTEROL SULFATE 0.083% NEBULIZATION SOLUTION 03/14/2021 6   AZELASTINE HYDROCHLORIDE 137MCG/SPRAY SOLUTION 02/07/2021 50   COLESTIPOL HCL 1GM TABLET 03/10/2021 30   DICYCLOMINE HYDROCHLORIDE 20MG  TABLET 02/06/2021 90   DOXAZOSIN MESYLATE 4MG  TABLET 01/18/2021  90   MIRTAZAPINE 30MG  TABLET 03/15/2021 30   BUSPIRONE HYDROCHLORIDE 15MG  TABLET 01/18/2021 90   DIAZEPAM 5MG  TABLET 03/14/2021 30   Have you seen any other providers since your last visit? **yes, she went to [facility] in clinic 3-4 weeks ago for wrist injury.   Any changes in your medications or health? no  Any side effects from any medications? Yes, Doxazosin causing some dizziness, Colestid is becoming hard to swallow.   Do you have an symptoms or problems not managed by your medications? Yes, she still has some depression and her anxiety is sky high, no medications work, she is very sensitive to medication.  She does see a psychologist.  Any concerns about your health right now? Yes, patient would like to loose some weight. Patient does follow a ketogenic diet which helps with her GI issue.   Has your provider asked that you check blood pressure, blood sugar, or follow special diet at home? Yes, patient is checking her blood pressures 1-2 times per month and her blood pressures are around 117/72  Do you get any type of exercise on a regular basis? Yes, helping with her grandson 3 days a week, this includes up and down stairs and doing her own house work.  Can you think of a goal you would like to reach for your health? Patient would like to discuss getting healthier and would like to be able to do things without being winded.  Do you have any problems getting your medications? Yes, Symbicort, patient was getting PAP and would like to do this again.  Is there anything that you would like to discuss during the appointment? Yes, patient would like to feel better and find a way she could come off some of her medications.  Please bring blood pressure meter, medications and supplements to appointment  Notes:  Patient states she was set up for this appointment and was not told about this until she saw it on MyChart.  I explained to her what it is we do and she agrees that this would be  a great help for her. I spent 45 min on the phone with the patient.  Care Gaps: AWV - scheduled for 04/11/2021 Last blood pressure - 124/70 on 10/25/2020 Covid vaccine - overdue Dexascan - overdue  Star Rating Drugs: None  [place]  Clinical Pharmacist Assistant [phone]
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
## 2021-04-04 ENCOUNTER — Telehealth: Payer: Self-pay | Admitting: Pharmacist

## 2021-04-04 NOTE — Chronic Care Management (AMB) (Signed)
    Chronic Care Management Pharmacy Assistant   Name: Brynnlie Unterreiner  MRN: [id number] DOB: May 17, 1956   Reason for Encounter: Patient called Dr. Berenice Bouton office wanting to move her appointment with Jeni Salles to later in the day. Spoke with patient and reviewed appointment options with her and she has decided to keep the appointment she has.     Care Gaps: AWV - scheduled for 04/11/2021 Last blood pressure - 124/70 on 10/25/2020 Covid vaccine - overdue Dexascan - overdue Last BP - 124/70 on 10/25/2020 HGA1C - 4.7 on 04/06/2020  Star Rating Drugs: None  [place]  Clinical Pharmacist Assistant [phone]

## 2021-04-06 ENCOUNTER — Telehealth: Payer: Self-pay | Admitting: Pharmacist

## 2021-04-06 NOTE — Chronic Care Management (AMB) (Signed)
    Chronic Care Management Pharmacy Assistant   Name: Brittany [name]  MRN: [id number] DOB: [birth year]  04/07/2021 APPOINTMENT REMINDER   Called Brittany [name], No answer, left message of appointment on 04/07/21 at 10:30 via office visit with Jeni Salles, Pharm D. Notified to have all medications, supplements, blood pressure and/or blood sugar logs and blood pressure monitor available during appointment and to return call if need to reschedule.  Care Gaps: AWV - scheduled for 04/11/2021 Last blood pressure - 124/70 on 10/25/2020 Covid vaccine - overdue Dexascan - overdue Last BP - 124/70 on 10/25/2020 HGA1C - 4.7 on 04/06/2020  Star Rating Drug: None  Any gaps in medications fill history? No  [place]  Catering manager [phone]

## 2021-04-07 ENCOUNTER — Telehealth: Payer: Self-pay | Admitting: Family Medicine

## 2021-04-07 ENCOUNTER — Ambulatory Visit: Payer: PPO

## 2021-04-07 DIAGNOSIS — M8580 Other specified disorders of bone density and structure, unspecified site: Secondary | ICD-10-CM

## 2021-04-07 DIAGNOSIS — Z1239 Encounter for other screening for malignant neoplasm of breast: Secondary | ICD-10-CM

## 2021-04-07 NOTE — Telephone Encounter (Signed)
Patient called stating that she is needing an order for a bone density scan sent to the [facility]. Patient is wanting to schedule her mammogram and is wanting to have her bone density done at the same time. Patient would like to have this done prior to her visit on 11/23 with Dr. Ethlyn Gallery so that she has the results and can review them during her visit.  Please advise.

## 2021-04-07 NOTE — Telephone Encounter (Signed)
Ok to put in orders for dexa and mammogram as requested. Not sure availability at breast center to get completed prior to visit with me as I think they are backed up a little, but she can try! If she can't get done before visit we can always review together by phone after if she has concerns.

## 2021-04-07 NOTE — Progress Notes (Deleted)
Chronic Care Management Pharmacy Note  04/07/2021 Name:  Brittany [name] MRN:  [id number] DOB:  08-16-1955  Summary: ***  Recommendations/Changes made from today's visit: ***  Plan: ***   Subjective: Brittany [name] is an 65 y.o. year old female who is a primary patient of Koberlein, Steele Berg, MD.  The CCM team was consulted for assistance with disease management and care coordination needs.    Engaged with patient face to face for initial visit in response to provider referral for pharmacy case management and/or care coordination services.   Consent to Services:  The patient was given the following information about Chronic Care Management services today, agreed to services, and gave verbal consent: 1. CCM service includes personalized support from designated clinical staff supervised by the primary care provider, including individualized plan of care and coordination with other care providers 2. 24/7 contact phone numbers for assistance for urgent and routine care needs. 3. Service will only be billed when office clinical staff spend 20 minutes or more in a month to coordinate care. 4. Only one practitioner may furnish and bill the service in a calendar month. 5.The patient may stop CCM services at any time (effective at the end of the month) by phone call to the office staff. 6. The patient will be responsible for cost sharing (co-pay) of up to 20% of the service fee (after annual deductible is met). Patient agreed to services and consent obtained.  Patient Care Team: Caren Macadam, MD as PCP - General (Family Medicine) Josue Hector, MD as PCP - Cardiology (Cardiology) Viona Gilmore, [facility] as Pharmacist (Pharmacist)  Recent office visits: 03/14/2021 Colin Benton DO [facility] Medicine) - Patient was seen for cough and other issues. Started Augmentin 872/125 mg. Follow up with PCP.   10/17/2020 Alysia Penna MD (PCP) - Patient was seen for pharyngitis. No  medication changes. No follow up noted.    Recent consult visits: 11/30/2020 Trula Slade MD (psychiatry) - Patient was seen for PTSD and additional issues. Changed Doxazosin 8 mg from 2 tablets qhs to 1 tablet qhs. Follow up in 3-4 months.   10/25/2020 Freda Jackson MD (pulmonary) - Patient was seen for Moderate persistent asthma without complication. Started Budeson -Glycopyrrol-Formoterol 160-9-4.8 mcg/ACT, discontinued Rifaximin. Follow up in 4 months.   10/04/2020 Thornton Park MD (gastroenterology) - Patient was seen for frequent stools. Started Rifaximin 550 mg, changed Colestipol to 2gm bid, discontinued Topiramate. No follow up noted.    Hospital visits: {Hospital DC Yes/No:25215}   Objective:  Lab Results  Component Value Date   CREATININE 0.94 04/06/2020   BUN 14 04/06/2020   GFR 73.16 02/11/2020   GFRNONAA >60 05/01/2019   GFRAA >60 05/01/2019   NA 142 04/06/2020   K 4.5 04/06/2020   CALCIUM 9.6 04/06/2020   CO2 24 04/06/2020   GLUCOSE 80 04/06/2020    Lab Results  Component Value Date/Time   HGBA1C 4.7 04/06/2020 09:16 AM   HGBA1C 4.9 11/28/2018 09:30 AM   GFR 73.16 02/11/2020 12:37 PM   GFR 81.62 05/12/2019 07:45 AM    Last diabetic Eye exam: No results found for: HMDIABEYEEXA  Last diabetic Foot exam: No results found for: HMDIABFOOTEX   Lab Results  Component Value Date   CHOL 243 (H) 04/06/2020   HDL 53 04/06/2020   LDLCALC 166 (H) 04/06/2020   TRIG 111 04/06/2020   CHOLHDL 4.6 04/06/2020    Hepatic Function Latest Ref Rng & Units 04/06/2020 05/12/2019 05/01/2019  Total  Protein 6.1 - 8.1 g/dL 6.3 5.7(L) 5.7(L)  Albumin 3.5 - 5.2 g/dL - 3.5 3.0(L)  AST 10 - 35 U/L _0 ALT 6 - 29 U/L 9 34 42  Alk Phosphatase 39 - 117 U/L - 50 55  Total Bilirubin 0.2 - 1.2 mg/dL 0.5 0.6 0.9    Lab Results  Component Value Date/Time   TSH 1.52 04/06/2020 09:16 AM   TSH 2.26 01/27/2018 11:48 AM    CBC Latest Ref Rng & Units 04/06/2020  02/11/2020 05/12/2019  WBC 3.8 - 10.8 Thousand/uL 4.7 5.2 4.0  Hemoglobin 11.7 - 15.5 g/dL 15.3 15.3(H) 13.3  Hematocrit 35.0 - 45.0 % 45.7(H) 44.5 40.2  Platelets 140 - 400 Thousand/uL 273 321.0 231.0    Lab Results  Component Value Date/Time   VD25OH 56 04/06/2020 09:16 AM   VD25OH 45.19 11/28/2018 09:30 AM   VD25OH 23.89 (L) 01/27/2018 11:48 AM    Clinical ASCVD: {YES/NO:21197} The 10-year ASCVD risk score (Arnett DK, et al., 2019) is: 6.4%   Values used to calculate the score:     Age: 48 years     Sex: Female     Is Non-Hispanic African American: No     Diabetic: No     Tobacco smoker: No     Systolic Blood Pressure: 841 mmHg     Is BP treated: No     HDL Cholesterol: 53 mg/dL     Total Cholesterol: 243 mg/dL    Depression screen [facility] 2/9 04/05/2020 01/22/2020 11/27/2018  Decreased Interest 1 0 3  Down, Depressed, Hopeless 1 0 3  PHQ - 2 Score 2 0 6  Altered sleeping 0 - 3  Tired, decreased energy 0 - 3  Change in appetite 1 - 2  Feeling bad or failure about yourself  0 - 3  Trouble concentrating 1 - 3  Moving slowly or fidgety/restless 0 - 2  Suicidal thoughts 0 - 0  PHQ-9 Score 4 - 22  Difficult doing work/chores - - -  Some recent data might be hidden     ***Other: (CHADS2VASc if Afib, MMRC or CAT for COPD, ACT, DEXA)  Social History   Tobacco Use  Smoking Status Former   Packs/day: 20.00   Years: 1.00   Pack years: 20.00   Types: Cigarettes   Quit date: 06/19/1999   Years since quitting: 21.8  Smokeless Tobacco Never  Tobacco Comments   20 year estimate but probably less    BP Readings from Last 3 Encounters:  01/25/21 129/85  10/25/20 124/70  10/17/20 112/80   Pulse Readings from Last 3 Encounters:  01/25/21 81  10/25/20 78  10/17/20 97   Wt Readings from Last 3 Encounters:  01/25/21 169 lb (76.7 kg)  01/11/21 169 lb (76.7 kg)  10/25/20 179 lb (81.2 kg)   BMI Readings from Last 3 Encounters:  01/25/21 30.91 kg/m  01/11/21 30.91 kg/m   10/25/20 32.74 kg/m    Assessment/Interventions: Review of patient past medical history, allergies, medications, health status, including review of consultants reports, laboratory and other test data, was performed as part of comprehensive evaluation and provision of chronic care management services.   SDOH:  (Social Determinants of Health) assessments and interventions performed: {yes/no:20286}  SDOH Screenings   Alcohol Screen: Not on file  Depression (LKG4-0): Not on file  Financial Resource Strain: Not on file  Food Insecurity: Not on file  Housing: Not on file  Physical Activity: Not on file  Social Connections: Not  on file  Stress: Not on file  Tobacco Use: Medium Risk   Smoking Tobacco Use: Former   Smokeless Tobacco Use: Never   Passive Exposure: Not on file  Transportation Needs: Not on file    CCM Care Plan  Allergies  Allergen Reactions   Aspirin Other (See Comments)    discomfort   Codeine Itching   Niacin And Related Itching   Sulfamethoxazole-Trimethoprim Other (See Comments)    Unknown reaction per pt   Topiramate Other (See Comments)    Per patient, it causes stomach burning   Trileptal [Oxcarbazepine]     itching   Trimethoprim Other (See Comments)    Unknown, per pt   Ultram [Tramadol] Itching   Vioxx [Rofecoxib]     Unknown per pt    Medications Reviewed Today     Reviewed by Purnell Shoemaker., MD (Psychiatrist) on 03/29/21 at [phone]  Med List Status: <None>   Medication Order Taking? Sig Documenting Provider Last Dose Status Informant  0.9 %  sodium chloride infusion [id number]   Thornton Park, MD  Active   0.9 %  sodium chloride infusion [id number]   Thornton Park, MD  Active   albuterol (PROVENTIL) (2.5 MG/3ML) 0.083% nebulizer solution [id number] Yes Take 3 mLs (2.5 mg total) by nebulization every 6 (six) hours as needed for wheezing or shortness of breath. Freddi Starr, MD Taking Active   Albuterol Sulfate (PROAIR RESPICLICK)  527 (90 Base) MCG/ACT AEPB [id number] Yes Inhale 2 puffs into the lungs every 6 (six) hours as needed. Freddi Starr, MD Taking Active   Ascorbic Acid (VITAMIN C PO) [id number] Yes Take by mouth. Super C with vitamin D [provider] Taking Active   azelastine (ASTELIN) 0.1 % nasal spray [id number] Yes Place 1 spray into both nostrils 2 (two) times daily. Use in each nostril as directed Freddi Starr, MD Taking Active   BIOTIN PO [id number] Yes Take by mouth. [provider] Taking Active   busPIRone (BUSPAR) 15 MG tablet [id number] Yes Take 1 tablet (15 mg total) by mouth 3 (three) times daily. Cottle, Billey Co., MD Taking Active   cetirizine (ZYRTEC) 10 MG tablet [id number] Yes Take 1 tablet (10 mg total) by mouth daily. [name], DO Taking Active   Cholecalciferol (VITAMIN D-3 PO) [id number] Yes Take 10,000 Units by mouth daily.  [provider] Taking Active Self  colestipol (COLESTID) 1 g tablet [id number] Yes Take 2 tablets (2 g total) by mouth 2 (two) times daily. Thornton Park, MD Taking Active   COLLAGEN PO [id number] Yes Take by mouth. [provider] Taking Active   diazepam (VALIUM) 5 MG tablet [id number] Yes Take 1 tablet (5 mg total) by mouth every 6 (six) hours as needed. for anxiety Cottle, Billey Co., MD Taking Active   dicyclomine (BENTYL) 20 MG tablet [id number] Yes TAKE 1 TABLET BY MOUTH FOUR TIMES A DAY AS NEEDED FOR Marlene Lard, MD Taking Active   doxazosin (CARDURA) 4 MG tablet [id number] Yes Take 2 tablets (8 mg total) by mouth at bedtime. Cottle, Billey Co., MD Taking Active   famotidine (PEPCID) 20 MG tablet [id number] Yes TAKE ONE TABLET BY MOUTH TWICE A DAY Freddi Starr, MD Taking Active   ferrous sulfate 325 (65 FE) MG EC tablet [id number] Yes Take 325 mg by mouth 2 (two) times a week. [provider] Taking Active   fluticasone (FLONASE) 50 MCG/ACT nasal spray [id number] Yes SPRAY TWO SPRAYS  IN [facility]  NOSTRIL TWICE DAILY Freddi Starr, MD Taking Active   MAGNESIUM PO [id number] Yes Calcium citrate, magnesium and vitamin D3 combo [provider] Taking Active   mirtazapine (REMERON) 30 MG tablet [id number] Yes TAKE ONE TABLET BY MOUTH EVERY NIGHT AT BEDTIME Cottle, Billey Co., MD Taking Active   montelukast (SINGULAIR) 10 MG tablet [id number] Yes Take 1 tablet (10 mg total) by mouth at bedtime. Freddi Starr, MD Taking Active   Multiple Vitamin (MULTIVITAMIN WITH MINERALS) TABS tablet [id number] Yes Take 1 tablet by mouth daily. [provider] Taking Active   Omega-3 Fatty Acids (FISH OIL PO) [id number] Yes Take 1,000 mg by mouth daily. [provider] Taking Active   pantoprazole (PROTONIX) 40 MG tablet [id number] Yes Take 1 tablet (40 mg total) by mouth 2 (two) times daily. Thornton Park, MD Taking Active   Zinc 50 MG TABS [id number] Yes  [provider] Taking Active             Patient Active Problem List   Diagnosis Date Noted   Clotting disorder ([place]) 01/24/2021   Pulmonary fibrosis ([place]) 04/06/2020   Dizziness 02/11/2020   Suspected COVID-19 virus infection 04/22/2019   Melena 04/22/2019   Suprapubic abdominal pain 04/22/2019   Encounter for screening for HIV 04/22/2019   Pneumonia due to COVID-19 virus 04/22/2019   Osteopenia 12/04/2016   Chronic post-traumatic stress disorder (PTSD) 08/23/2016   Bipolar affective disorder, current episode depressed ([place]) 08/23/2016   Hyperlipidemia 08/23/2016   COPD / AB vs ACOS 06/22/2016   Cough variant asthma  vs UACS  06/21/2016   GERD 04/03/2007    Immunization History  Administered Date(s) Administered   Fluad Quad(high Dose 65+) 02/28/2021   Influenza Split 04/26/2011   Influenza Whole 03/18/2009, 03/18/2010, 03/18/2016   Influenza,inj,Quad PF,6+ Mos 04/19/2014, 02/28/2017, 03/06/2018, 02/13/2019, 03/04/2020   PFIZER(Purple Top)SARS-COV-2 Vaccination 09/04/2019, 09/29/2019,  04/06/2020   Pneumococcal Conjugate-13 03/10/2015   Pneumococcal Polysaccharide-23 06/18/2005   Tdap 12/04/2016   Zoster Recombinat (Shingrix) 01/01/2018, 06/22/2018    Conditions to be addressed/monitored:  {USCCMDZASSESSMENTOPTIONS:23563}  There are no care plans that you recently modified to display for this patient.    Medication Assistance: {MEDASSISTANCEINFO:25044}  Compliance/Adherence/Medication fill history: Care Gaps: ***  Star-Rating Drugs: ***  Patient's preferred pharmacy is:  [facility] [id number] - [address] [place] [address] Phone: [phone] Fax: [phone]  Uses pill box? {Yes or If no, why not?:20788} Pt endorses ***% compliance  We discussed: {Pharmacy options:24294} Patient decided to: {US Pharmacy [facility]  Care Plan and Follow Up Patient Decision:  {FOLLOWUP:24991}  Plan: {CM FOLLOW UP AJOI:78676}  Jeni Salles, PharmD, [facility] Clinical Pharmacist {MP practice sites:26434} [phone]

## 2021-04-10 NOTE — Telephone Encounter (Signed)
Orders entered and the patient was informed of the message below.

## 2021-04-11 ENCOUNTER — Other Ambulatory Visit: Payer: Self-pay

## 2021-04-11 ENCOUNTER — Ambulatory Visit (INDEPENDENT_AMBULATORY_CARE_PROVIDER_SITE_OTHER): Payer: PPO

## 2021-04-11 VITALS — BP 129/72 | HR 74

## 2021-04-11 DIAGNOSIS — Z0000 Encounter for general adult medical examination without abnormal findings: Secondary | ICD-10-CM | POA: Diagnosis not present

## 2021-04-11 NOTE — Patient Instructions (Addendum)
Brittany [name] , Thank you for taking time to come for your Medicare Wellness Visit. I appreciate your ongoing commitment to your health goals. Please review the following plan we discussed and let me know if I can assist you in the future.   Screening recommendations/referrals: Colonoscopy: Done 04/09/18 repeat every 5 years  Mammogram: Scheduled 09/13/21 Bone Density: Scheduled  09/13/21 Recommended yearly ophthalmology/optometry visit for glaucoma screening and checkup Recommended yearly dental visit for hygiene and checkup  Vaccinations: Influenza vaccine: Done 02/28/21 repeat every year  Pneumococcal vaccine: Up to date Tdap vaccine: Done 12/04/16 repeat every 10 years  Shingles vaccine: Completed 01/01/18 & 06/22/18 Covid-19:Completed 3/19, 4/13, 04/06/20  Advanced directives: Copies in chart   Conditions/risks identified: Lose 45-50 lbs   Next appointment: Follow up in one year for your annual wellness visit    Preventive Care 36 Years and Older, Female Preventive care refers to lifestyle choices and visits with your health care provider that can promote health and wellness. What does preventive care include? A yearly physical exam. This is also called an annual well check. Dental exams once or twice a year. Routine eye exams. Ask your health care provider how often you should have your eyes checked. Personal lifestyle choices, including: Daily care of your teeth and gums. Regular physical activity. Eating a healthy diet. Avoiding tobacco and drug use. Limiting alcohol use. Practicing safe sex. Taking low-dose aspirin every day. Taking vitamin and mineral supplements as recommended by your health care provider. What happens during an annual well check? The services and screenings done by your health care provider during your annual well check will depend on your age, overall health, lifestyle risk factors, and family history of disease. Counseling  Your health care provider may  ask you questions about your: Alcohol use. Tobacco use. Drug use. Emotional well-being. Home and relationship well-being. Sexual activity. Eating habits. History of falls. Memory and ability to understand (cognition). Work and work Statistician. Reproductive health. Screening  You may have the following tests or measurements: Height, weight, and BMI. Blood pressure. Lipid and cholesterol levels. These may be checked every 5 years, or more frequently if you are over 52 years old. Skin check. Lung cancer screening. You may have this screening every year starting at age 30 if you have a 30-pack-year history of smoking and currently smoke or have quit within the past 15 years. Fecal occult blood test (FOBT) of the stool. You may have this test every year starting at age 16. Flexible sigmoidoscopy or colonoscopy. You may have a sigmoidoscopy every 5 years or a colonoscopy every 10 years starting at age 72. Hepatitis C blood test. Hepatitis B blood test. Sexually transmitted disease (STD) testing. Diabetes screening. This is done by checking your blood sugar (glucose) after you have not eaten for a while (fasting). You may have this done every 1-3 years. Bone density scan. This is done to screen for osteoporosis. You may have this done starting at age 24. Mammogram. This may be done every 1-2 years. Talk to your health care provider about how often you should have regular mammograms. Talk with your health care provider about your test results, treatment options, and if necessary, the need for more tests. Vaccines  Your health care provider may recommend certain vaccines, such as: Influenza vaccine. This is recommended every year. Tetanus, diphtheria, and acellular pertussis (Tdap, Td) vaccine. You may need a Td booster every 10 years. Zoster vaccine. You may need this after age 77. Pneumococcal 13-valent conjugate (  PCV13) vaccine. One dose is recommended after age 66. Pneumococcal  polysaccharide (PPSV23) vaccine. One dose is recommended after age 8. Talk to your health care provider about which screenings and vaccines you need and how often you need them. This information is not intended to replace advice given to you by your health care provider. Make sure you discuss any questions you have with your health care provider. Document Released: 07/01/2015 Document Revised: 02/22/2016 Document Reviewed: 04/05/2015 Elsevier Interactive Patient Education  2017 [employer] Prevention in the Home Falls can cause injuries. They can happen to people of all ages. There are many things you can do to make your home safe and to help prevent falls. What can I do on the outside of my home? Regularly fix the edges of walkways and driveways and fix any cracks. Remove anything that might make you trip as you walk through a door, such as a raised step or threshold. Trim any bushes or trees on the path to your home. Use bright outdoor lighting. Clear any walking paths of anything that might make someone trip, such as rocks or tools. Regularly check to see if handrails are loose or broken. Make sure that both sides of any steps have handrails. Any raised decks and porches should have guardrails on the edges. Have any leaves, snow, or ice cleared regularly. Use sand or salt on walking paths during winter. Clean up any spills in your garage right away. This includes oil or grease spills. What can I do in the bathroom? Use night lights. Install grab bars by the toilet and in the tub and shower. Do not use towel bars as grab bars. Use non-skid mats or decals in the tub or shower. If you need to sit down in the shower, use a plastic, non-slip stool. Keep the floor dry. Clean up any water that spills on the floor as soon as it happens. Remove soap buildup in the tub or shower regularly. Attach bath mats securely with double-sided non-slip rug tape. Do not have throw rugs and other  things on the floor that can make you trip. What can I do in the bedroom? Use night lights. Make sure that you have a light by your bed that is easy to reach. Do not use any sheets or blankets that are too big for your bed. They should not hang down onto the floor. Have a firm chair that has side arms. You can use this for support while you get dressed. Do not have throw rugs and other things on the floor that can make you trip. What can I do in the kitchen? Clean up any spills right away. Avoid walking on wet floors. Keep items that you use a lot in easy-to-reach places. If you need to reach something above you, use a strong step stool that has a grab bar. Keep electrical cords out of the way. Do not use floor polish or wax that makes floors slippery. If you must use wax, use non-skid floor wax. Do not have throw rugs and other things on the floor that can make you trip. What can I do with my stairs? Do not leave any items on the stairs. Make sure that there are handrails on both sides of the stairs and use them. Fix handrails that are broken or loose. Make sure that handrails are as long as the stairways. Check any carpeting to make sure that it is firmly attached to the stairs. Fix any carpet that is  loose or worn. Avoid having throw rugs at the top or bottom of the stairs. If you do have throw rugs, attach them to the floor with carpet tape. Make sure that you have a light switch at the top of the stairs and the bottom of the stairs. If you do not have them, ask someone to add them for you. What else can I do to help prevent falls? Wear shoes that: Do not have high heels. Have rubber bottoms. Are comfortable and fit you well. Are closed at the toe. Do not wear sandals. If you use a stepladder: Make sure that it is fully opened. Do not climb a closed stepladder. Make sure that both sides of the stepladder are locked into place. Ask someone to hold it for you, if possible. Clearly  mark and make sure that you can see: Any grab bars or handrails. First and last steps. Where the edge of each step is. Use tools that help you move around (mobility aids) if they are needed. These include: Canes. Walkers. Scooters. Crutches. Turn on the lights when you go into a dark area. Replace any light bulbs as soon as they burn out. Set up your furniture so you have a clear path. Avoid moving your furniture around. If any of your floors are uneven, fix them. If there are any pets around you, be aware of where they are. Review your medicines with your doctor. Some medicines can make you feel dizzy. This can increase your chance of falling. Ask your doctor what other things that you can do to help prevent falls. This information is not intended to replace advice given to you by your health care provider. Make sure you discuss any questions you have with your health care provider. Document Released: 03/31/2009 Document Revised: 11/10/2015 Document Reviewed: 07/09/2014 Elsevier Interactive Patient Education  2017 [employer].

## 2021-04-11 NOTE — Progress Notes (Addendum)
Virtual Visit via Telephone Note  I connected with  Brittany [name] on 04/11/21 at  8:00 AM EDT by telephone and verified that I am speaking with the correct person using two identifiers.  Medicare Annual Wellness visit completed telephonically due to Covid-19 pandemic.   Persons participating in this call: This Health Coach and this patient.   Location: Patient: Home Provider: Office   I discussed the limitations, risks, security and privacy concerns of performing an evaluation and management service by telephone and the availability of in person appointments. The patient expressed understanding and agreed to proceed.  Unable to perform video visit due to video visit attempted and failed and/or patient does not have video capability.   Some vital signs may be absent or patient reported.   Willette Brace, LPN   Subjective:   Brittany [name] is a 65 y.o. female who presents for Medicare Annual (Subsequent) preventive examination.  Review of Systems     Cardiac Risk Factors include: advanced age (>31men, >47 women);dyslipidemia;obesity (BMI >30kg/m2)     Objective:    Today's Vitals   04/11/21 0805  BP: 129/72  Pulse: 74  PainSc: 5    There is no height or weight on file to calculate BMI.  Advanced Directives 04/11/2021 01/25/2021 04/05/2020 01/22/2020 04/23/2019 04/21/2019 11/19/2017  Does Patient Have a Medical Advance Directive? Yes Yes Yes Yes - No (No Data)  Type of Paramedic of [place];Living will [place]  Does patient want to make changes to medical advance directive? - No - Patient declined - - - - -  Copy of [place] in Chart? Yes - validated most recent copy scanned in chart (See row information) - No - copy requested - - - -  Would patient like information on creating a medical advance directive? - - - - No - Patient declined - -    Current Medications  (verified) Outpatient Encounter Medications as of 04/11/2021  Medication Sig   albuterol (PROVENTIL) (2.5 MG/3ML) 0.083% nebulizer solution Take 3 mLs (2.5 mg total) by nebulization every 6 (six) hours as needed for wheezing or shortness of breath.   Albuterol Sulfate (PROAIR RESPICLICK) 836 (90 Base) MCG/ACT AEPB Inhale 2 puffs into the lungs every 6 (six) hours as needed.   Ascorbic Acid (VITAMIN C PO) Take by mouth. Super C with vitamin D   azelastine (ASTELIN) 0.1 % nasal spray Place 1 spray into both nostrils 2 (two) times daily. Use in each nostril as directed   b complex vitamins capsule Take 1 capsule by mouth daily.   BIOTIN PO Take by mouth.   busPIRone (BUSPAR) 15 MG tablet Take 1 tablet (15 mg total) by mouth 3 (three) times daily.   cetirizine (ZYRTEC) 10 MG tablet Take 1 tablet (10 mg total) by mouth daily.   Cholecalciferol (VITAMIN D-3 PO) Take 10,000 Units by mouth daily.    colestipol (COLESTID) 1 g tablet Take 2 tablets (2 g total) by mouth 2 (two) times daily.   COLLAGEN PO Take by mouth.   diazepam (VALIUM) 5 MG tablet Take 1 tablet (5 mg total) by mouth every 6 (six) hours as needed. for anxiety   dicyclomine (BENTYL) 20 MG tablet TAKE 1 TABLET BY MOUTH FOUR TIMES A DAY AS NEEDED FOR SPASMS   doxazosin (CARDURA) 4 MG tablet Take 2 tablets (8 mg total) by mouth at bedtime.   famotidine (PEPCID) 20  MG tablet TAKE ONE TABLET BY MOUTH TWICE A DAY   ferrous sulfate 325 (65 FE) MG EC tablet Take 325 mg by mouth 2 (two) times a week.   fluticasone (FLONASE) 50 MCG/ACT nasal spray SPRAY TWO SPRAYS IN EACH NOSTRIL TWICE DAILY   MAGNESIUM PO Calcium citrate, magnesium and vitamin D3 combo   mirtazapine (REMERON) 30 MG tablet Take 1 tablet (30 mg total) by mouth at bedtime.   montelukast (SINGULAIR) 10 MG tablet Take 1 tablet (10 mg total) by mouth at bedtime.   Multiple Vitamin (MULTIVITAMIN WITH MINERALS) TABS tablet Take 1 tablet by mouth daily.   Omega-3 Fatty Acids (FISH OIL  PO) Take 1,000 mg by mouth daily.   pantoprazole (PROTONIX) 40 MG tablet Take 1 tablet (40 mg total) by mouth 2 (two) times daily.   Zinc 50 MG TABS    Facility-Administered Encounter Medications as of 04/11/2021  Medication   0.9 %  sodium chloride infusion   0.9 %  sodium chloride infusion    Allergies (verified) Aspirin, Codeine, Niacin and related, Sulfamethoxazole-trimethoprim, Topiramate, Trileptal [oxcarbazepine], Trimethoprim, Ultram [tramadol], and Vioxx [rofecoxib]   History: Past Medical History:  Diagnosis Date   Abnormal EKG    Allergy    Anxiety    Asthma    Bipolar depression ([place]), Anxiety, PTSD, Panic disorder    -managed by [facility]   Depression    Foot pain    GERD (gastroesophageal reflux disease)    Heart murmur    as a child   Insomnia    Leg swelling    Osteoporosis    osteopenia   Tachycardia    Past Surgical History:  Procedure Laterality Date   BREAST BIOPSY Right    biopsy of nipple   CARPAL TUNNEL RELEASE Left    CESAREAN SECTION     x 2   CHOLECYSTECTOMY     COLONOSCOPY     ENDOMETRIAL ABLATION     KNEE SURGERY Right    reconstruction for patellar dislocation   LAPAROSCOPY     x 2   TUBAL LIGATION     ULNAR NERVE REPAIR Left    UPPER GASTROINTESTINAL ENDOSCOPY     Family History  Problem Relation Age of Onset   Heart failure Mother    Hypertension Mother    Hyperlipidemia Mother    Congestive Heart Failure Mother    Other Father        killed   COPD Paternal 36        a lot of aunts and uncle COPD or Emphysema   Emphysema Paternal Uncle    Colon polyps Maternal Aunt    COPD Brother    Heart disease Brother    Pulmonary embolism Brother    Breast cancer Neg Hx    Colon cancer Neg Hx    Esophageal cancer Neg Hx    Pancreatic cancer Neg Hx    Stomach cancer Neg Hx    Liver disease Neg Hx    Rectal cancer Neg Hx    Social History   Socioeconomic History   Marital status: Single    Spouse name: Not on  file   Number of children: 3   Years of education: Not on file   Highest education level: Not on file  Occupational History   Occupation: disabled  Tobacco Use   Smoking status: Former    Packs/day: 20.00    Years: 1.00    Pack years: 20.00    Types: Cigarettes  Quit date: 06/19/1999    Years since quitting: 21.8   Smokeless tobacco: Never   Tobacco comments:    20 year estimate but probably less   Vaping Use   Vaping Use: Never used  Substance and Sexual Activity   Alcohol use: Yes    Alcohol/week: 0.0 standard drinks    Comment: occ   Drug use: No   Sexual activity: Never  Other Topics Concern   Not on file  Social History Narrative   Work or School: Disabled seconndary to psychiatric conditions      Home Situation: lives alone with 2 cats and one dog      Spiritual Beliefs: Christian      Lifestyle: no regular exercise, diet  Not great - wants to embark on healthier lifestyle   Social Determinants of Health   Financial Resource Strain: Low Risk    Difficulty of Paying Living Expenses: Not hard at all  Food Insecurity: No Food Insecurity   Worried About Charity fundraiser in the Last Year: Never true   Arboriculturist in the Last Year: Never true  Transportation Needs: No Transportation Needs   Lack of Transportation (Medical): No   Lack of Transportation (Non-Medical): No  Physical Activity: Inactive   Days of Exercise per Week: 0 days   Minutes of Exercise per Session: 0 min  Stress: Stress Concern Present   Feeling of Stress : To some extent  Social Connections: Moderately Isolated   Frequency of Communication with Friends and Family: More than three times a week   Frequency of Social Gatherings with Friends and Family: More than three times a week   Attends Religious Services: 1 to 4 times per year   Active Member of [employer] or Organizations: No   Attends Music therapist: Never   Marital Status: Never married    Tobacco  Counseling Counseling given: Not Answered Tobacco comments: 20 year estimate but probably less    Clinical Intake:  Pre-visit preparation completed: Yes  Pain : 0-10 Pain Score: 5  Pain Type: Chronic pain Pain Location: Back Pain Orientation: Lower Pain Descriptors / Indicators: Spasm, Sharp, Other (Comment) (pulsating) Pain Onset: More than a month ago Pain Frequency: Intermittent     BMI - recorded: 30.91 Nutritional Status: BMI > 30  Obese Nutritional Risks: Nausea/ vomitting/ diarrhea Diabetes: No  How often do you need to have someone help you when you read instructions, pamphlets, or other written materials from your doctor or pharmacy?: 1 - Never  Diabetic?No  Interpreter Needed?: No  Information entered by :: Charlott Rakes, LPN   Activities of Daily Living In your present state of health, do you have any difficulty performing the following activities: 04/11/2021  Hearing? Y  Comment wax build up  Vision? N  Difficulty concentrating or making decisions? N  Walking or climbing stairs? Y  Comment tired  Dressing or bathing? N  Doing errands, shopping? N  Preparing Food and eating ? N  Using the Toilet? N  In the past six months, have you accidently leaked urine? Y  Comment at times  Do you have problems with loss of bowel control? Y  Comment at times and wears a panty liner  Managing your Medications? N  Managing your Finances? N  Housekeeping or managing your Housekeeping? N  Some recent data might be hidden    Patient Care Team: Caren Macadam, MD as PCP - General (Family Medicine) Josue Hector, MD as  PCP - Cardiology (Cardiology) Viona Gilmore, [facility] as Pharmacist (Pharmacist)  Indicate any recent Medical Services you may have received from other than [facility] providers in the past year (date may be approximate).     Assessment:   This is a routine wellness examination for Jearlean.  Hearing/Vision screen Hearing Screening - Comments::  Pt stated slight hearing issues with wax build up  Vision Screening - Comments:: Pt follows up with [facility] for annual eye exams   Dietary issues and exercise activities discussed: Current Exercise Habits: The patient does not participate in regular exercise at present (pt stated she care for grandchild)   Goals Addressed             This Visit's Progress    Patient Stated       Lose 45-50 lbs        Depression Screen PHQ 2/9 Scores 04/11/2021 04/05/2020 01/22/2020 11/27/2018 11/19/2017 11/16/2016 09/13/2014  PHQ - 2 Score 1 2 0 6 0 0 3  PHQ- 9 Score - 4 - 22 - - 11    Fall Risk Fall Risk  04/11/2021 04/05/2020 01/22/2020 11/27/2018 11/19/2017  Falls in the past year? 0 0 0 0 Yes  Comment - - - - putting an antenna up   Number falls in past yr: 0 0 - 0 1  Injury with Fall? 0 0 - 0 No  Risk for fall due to : Impaired vision;Impaired balance/gait Impaired balance/gait;Impaired vision - - -  Follow up Falls prevention discussed Falls prevention discussed - - Education provided  Comment - - - - -    FALL RISK PREVENTION PERTAINING TO THE HOME:  Any stairs in or around the home? Yes  If so, are there any without handrails? No  Home free of loose throw rugs in walkways, pet beds, electrical cords, etc? Yes  Adequate lighting in your home to reduce risk of falls? Yes   ASSISTIVE DEVICES UTILIZED TO PREVENT FALLS:  Life alert? No  Use of a cane, walker or w/c? Yes  Grab bars in the bathroom? Yes  Shower chair or bench in shower? No  Elevated toilet seat or a handicapped toilet? No   TIMED UP AND GO:  Was the test performed? No .    Cognitive Function: MMSE - Mini Mental State Exam 11/19/2017 11/16/2016  Not completed: (No Data) (No Data)     6CIT Screen 04/11/2021 04/05/2020  What Year? 0 points 0 points  What month? 0 points 0 points  What time? 0 points -  Count back from 20 0 points 0 points  Months in reverse 0 points 0 points  Repeat phrase 0 points 0 points   Total Score 0 -    Immunizations Immunization History  Administered Date(s) Administered   Fluad Quad(high Dose 65+) 02/28/2021   Influenza Split 04/26/2011   Influenza Whole 03/18/2009, 03/18/2010, 03/18/2016   Influenza,inj,Quad PF,6+ Mos 04/19/2014, 02/28/2017, 03/06/2018, 02/13/2019, 03/04/2020   PFIZER(Purple Top)SARS-COV-2 Vaccination 09/04/2019, 09/29/2019, 04/06/2020   Pneumococcal Conjugate-13 03/10/2015   Pneumococcal Polysaccharide-23 06/18/2005   Tdap 12/04/2016   Zoster Recombinat (Shingrix) 01/01/2018, 06/22/2018    TDAP status: Up to date  Flu Vaccine status: Up to date  Pneumococcal vaccine status: Up to date  Covid-19 vaccine status: Completed vaccines  Qualifies for Shingles Vaccine? Yes   Zostavax completed Yes   Shingrix Completed?: Yes  Screening Tests Health Maintenance  Topic Date Due   COVID-19 Vaccine (4 - Booster for [place] series) 06/01/2020  Pneumonia Vaccine 58+ Years old (3 - PPSV23 if available, else PCV20) 08/14/2020   DEXA SCAN  02/12/2021   PAP SMEAR-Modifier  11/27/2021   MAMMOGRAM  03/01/2022   COLONOSCOPY (Pts 45-18yrs Insurance coverage will need to be confirmed)  04/10/2023   TETANUS/TDAP  12/05/2026   INFLUENZA VACCINE  Completed   Hepatitis C Screening  Completed   HIV Screening  Completed   Zoster Vaccines- Shingrix  Completed   HPV VACCINES  Aged Out    Health Maintenance  Health Maintenance Due  Topic Date Due   COVID-19 Vaccine (4 - Booster for Pfizer series) 06/01/2020   Pneumonia Vaccine 45+ Years old (3 - PPSV23 if available, else PCV20) 08/14/2020   DEXA SCAN  02/12/2021    Colorectal cancer screening: Type of screening: Colonoscopy. Completed 04/09/18. Repeat every 5 years  Mammogram status: Completed 03/01/20. Repeat every year pt scheduled 09/13/21  Bone Density status: Completed 02/13/19. Results reflect: Bone density results: OSTEOPENIA. Repeat every 2 years.  Additional Screening:  Hepatitis C  Screening: Completed 06/18/10  Vision Screening: Recommended annual ophthalmology exams for early detection of glaucoma and other disorders of the eye. Is the patient up to date with their annual eye exam?  No  Who is the provider or what is the name of the office in which the patient attends annual eye exams? [facility]  If pt is not established with a provider, would they like to be referred to a provider to establish care? No .   Dental Screening: Recommended annual dental exams for proper oral hygiene  Community Resource Referral / Chronic Care Management: CRR required this visit?  No   CCM required this visit?  No      Plan:     I have personally reviewed and noted the following in the patient's chart:   Medical and social history Use of alcohol, tobacco or illicit drugs  Current medications and supplements including opioid prescriptions.  Functional ability and status Nutritional status Physical activity Advanced directives List of other physicians Hospitalizations, surgeries, and ER visits in previous 12 months Vitals Screenings to include cognitive, depression, and falls Referrals and appointments  In addition, I have reviewed and discussed with patient certain preventive protocols, quality metrics, and best practice recommendations. A written personalized care plan for preventive services as well as general preventive health recommendations were provided to patient.     Willette Brace, LPN   [id number]   Nurse Notes: None

## 2021-05-10 ENCOUNTER — Ambulatory Visit (INDEPENDENT_AMBULATORY_CARE_PROVIDER_SITE_OTHER): Payer: PPO | Admitting: Family Medicine

## 2021-05-10 ENCOUNTER — Encounter: Payer: Self-pay | Admitting: Family Medicine

## 2021-05-10 VITALS — BP 110/78 | HR 82 | Temp 97.8°F | Ht 61.5 in | Wt 173.0 lb

## 2021-05-10 DIAGNOSIS — R790 Abnormal level of blood mineral: Secondary | ICD-10-CM | POA: Diagnosis not present

## 2021-05-10 DIAGNOSIS — L609 Nail disorder, unspecified: Secondary | ICD-10-CM

## 2021-05-10 DIAGNOSIS — E538 Deficiency of other specified B group vitamins: Secondary | ICD-10-CM | POA: Diagnosis not present

## 2021-05-10 DIAGNOSIS — Z0000 Encounter for general adult medical examination without abnormal findings: Secondary | ICD-10-CM | POA: Diagnosis not present

## 2021-05-10 DIAGNOSIS — K219 Gastro-esophageal reflux disease without esophagitis: Secondary | ICD-10-CM | POA: Diagnosis not present

## 2021-05-10 DIAGNOSIS — Z23 Encounter for immunization: Secondary | ICD-10-CM | POA: Diagnosis not present

## 2021-05-10 DIAGNOSIS — R7989 Other specified abnormal findings of blood chemistry: Secondary | ICD-10-CM

## 2021-05-10 DIAGNOSIS — R252 Cramp and spasm: Secondary | ICD-10-CM

## 2021-05-10 DIAGNOSIS — J45991 Cough variant asthma: Secondary | ICD-10-CM | POA: Diagnosis not present

## 2021-05-10 DIAGNOSIS — R5383 Other fatigue: Secondary | ICD-10-CM | POA: Diagnosis not present

## 2021-05-10 DIAGNOSIS — E785 Hyperlipidemia, unspecified: Secondary | ICD-10-CM

## 2021-05-10 LAB — COMPREHENSIVE METABOLIC PANEL
ALT: 14 U/L (ref 0–35)
AST: 15 U/L (ref 0–37)
Albumin: 4.7 g/dL (ref 3.5–5.2)
Alkaline Phosphatase: 58 U/L (ref 39–117)
BUN: 12 mg/dL (ref 6–23)
CO2: 29 mEq/L (ref 19–32)
Calcium: 9.2 mg/dL (ref 8.4–10.5)
Chloride: 104 mEq/L (ref 96–112)
Creatinine, Ser: 0.67 mg/dL (ref 0.40–1.20)
GFR: 91.52 mL/min (ref >60.00)
Glucose, Bld: 97 mg/dL (ref 70–99)
Potassium: 4.4 mEq/L (ref 3.5–5.1)
Sodium: 141 mEq/L (ref 135–145)
Total Bilirubin: 0.4 mg/dL (ref 0.2–1.2)
Total Protein: 7.1 g/dL (ref 6.0–8.3)

## 2021-05-10 LAB — VITAMIN D 25 HYDROXY (VIT D DEFICIENCY, FRACTURES): VITD: 87.95 ng/mL (ref 30.00–100.00)

## 2021-05-10 LAB — CBC WITH DIFFERENTIAL/PLATELET
Basophils Absolute: 0 10*3/uL (ref 0.0–0.1)
Basophils Relative: 1 % (ref 0.0–3.0)
Eosinophils Absolute: 0.1 10*3/uL (ref 0.0–0.7)
Eosinophils Relative: 1.4 % (ref 0.0–5.0)
HCT: 45.9 % (ref 36.0–46.0)
Hemoglobin: 15.5 g/dL — ABNORMAL HIGH (ref 12.0–15.0)
Lymphocytes Relative: 17.8 % (ref 12.0–46.0)
Lymphs Abs: 0.8 10*3/uL (ref 0.7–4.0)
MCHC: 33.8 g/dL (ref 30.0–36.0)
MCV: 91.1 fl (ref 78.0–100.0)
Monocytes Absolute: 0.3 10*3/uL (ref 0.1–1.0)
Monocytes Relative: 6.5 % (ref 3.0–12.0)
Neutro Abs: 3.2 10*3/uL (ref 1.4–7.7)
Neutrophils Relative %: 73.3 % (ref 43.0–77.0)
Platelets: 318 10*3/uL (ref 150.0–400.0)
RBC: 5.04 Mil/uL (ref 3.87–5.11)
RDW: 14.6 % (ref 11.5–15.5)
WBC: 4.4 10*3/uL (ref 4.0–10.5)

## 2021-05-10 LAB — LIPID PANEL
Cholesterol: 243 mg/dL — ABNORMAL HIGH (ref 0–200)
HDL: 70.9 mg/dL (ref >39.00)
LDL Cholesterol: 158 mg/dL — ABNORMAL HIGH (ref 0–99)
NonHDL: 172.08
Total CHOL/HDL Ratio: 3
Triglycerides: 72 mg/dL (ref 0.0–149.0)
VLDL: 14.4 mg/dL (ref 0.0–40.0)

## 2021-05-10 LAB — IBC + FERRITIN
Ferritin: 36.4 ng/mL (ref 10.0–291.0)
Iron: 53 ug/dL (ref 42–145)
Saturation Ratios: 12.8 % — ABNORMAL LOW (ref 20.0–50.0)
TIBC: 413 ug/dL (ref 250.0–450.0)
Transferrin: 295 mg/dL (ref 212.0–360.0)

## 2021-05-10 LAB — TSH: TSH: 1.75 u[IU]/mL (ref 0.35–5.50)

## 2021-05-10 LAB — VITAMIN B12: Vitamin B-12: 533 pg/mL (ref 211–911)

## 2021-05-10 LAB — FOLATE: Folate: >23.4 ng/mL (ref >5.9)

## 2021-05-10 NOTE — Progress Notes (Signed)
Brittany [name] DOB: 1955-11-21 Encounter date: 05/10/2021  This is a 65 y.o. female who presents for complete physical   History of present illness/Additional concerns:  Breathing has been doing better. Just has some post nasal drainage/vocal cord damage - so this triggers a little cough.   Has been staying extremely tired. Helping daughter with childcare 4-5 days/week. Doesn't think that is it. Eating as healthy as she can. She does sleep ok. Sometimes will get in a full 8 hours. Never really feels rested. Just keeps pushing through.   She is concerned about fingernails - they are cracking, splitting, some vertical ridges. If they grow out then they split. She has been taking magnesium because she read this can help. Has been going on at least 4-5 years. What she thinks of is that her mom always said that it was a sign of heart disease and mother did die from chf. Taking zinc as well - separate and as part of multivitamin.   GERD: taking both pepcid and protonix. Break through sx are minimal. Does have bed elevated as well.     Mood: harder time in winter months. Does tend to stay depressed. Looks forward to spring. Does follow with Dr. Clovis Pu. 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   Right now sciatica is bothering her. Not sure what she did, but spends time with grandchildren.   Last pap was 11/2018 normal, negative HPV Mammogram scheduled for 08/2021 along with bone density Last colonoscopy 03/2018; repeat in 10 years listed, but per patient she will repeat in 5 years due to upper endoscopy findings.  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 She has been getting these every 6 months.    Past Medical History:  Diagnosis Date   Abnormal EKG    Allergy    Anxiety    Asthma    Bipolar depression ([place]), Anxiety, PTSD, Panic disorder    -managed by [facility]   Depression    Foot pain    GERD (gastroesophageal reflux disease)     Heart murmur    as a child   Insomnia    Leg swelling    Osteoporosis    osteopenia   Pneumonia due to COVID-19 virus 04/22/2019   Tachycardia    Past Surgical History:  Procedure Laterality Date   BREAST BIOPSY Right    biopsy of nipple   CARPAL TUNNEL RELEASE Left    CESAREAN SECTION     x 2   CHOLECYSTECTOMY     COLONOSCOPY     ENDOMETRIAL ABLATION     KNEE SURGERY Right    reconstruction for patellar dislocation   LAPAROSCOPY     x 2   TUBAL LIGATION     ULNAR NERVE REPAIR Left    UPPER GASTROINTESTINAL ENDOSCOPY     Allergies  Allergen Reactions   Aspirin Other (See Comments)    discomfort   Codeine Itching   Niacin And Related Itching   Sulfamethoxazole-Trimethoprim Other (See Comments)    Unknown reaction per pt   Topiramate Other (See Comments)    Per patient, it causes stomach burning   Trileptal [Oxcarbazepine]     itching   Trimethoprim Other (See Comments)    Unknown, per pt   Ultram [Tramadol] Itching   Vioxx [Rofecoxib]     Unknown per pt   Current Meds  Medication Sig   albuterol (PROVENTIL) (2.5 MG/3ML) 0.083% nebulizer solution Take 3 mLs (2.5 mg total) by nebulization  every 6 (six) hours as needed for wheezing or shortness of breath.   Albuterol Sulfate (PROAIR RESPICLICK) 956 (90 Base) MCG/ACT AEPB Inhale 2 puffs into the lungs every 6 (six) hours as needed.   Ascorbic Acid (VITAMIN C PO) Take by mouth. Super C with vitamin D   azelastine (ASTELIN) 0.1 % nasal spray Place 1 spray into both nostrils 2 (two) times daily. Use in each nostril as directed   b complex vitamins capsule Take 1 capsule by mouth daily.   BIOTIN PO Take by mouth.   busPIRone (BUSPAR) 15 MG tablet Take 1 tablet (15 mg total) by mouth 3 (three) times daily.   cetirizine (ZYRTEC) 10 MG tablet Take 1 tablet (10 mg total) by mouth daily.   Cholecalciferol (VITAMIN D-3 PO) Take 10,000 Units by mouth daily.    colestipol (COLESTID) 1 g tablet Take 2 tablets (2 g total) by  mouth 2 (two) times daily.   COLLAGEN PO Take by mouth.   diazepam (VALIUM) 5 MG tablet Take 1 tablet (5 mg total) by mouth every 6 (six) hours as needed. for anxiety   dicyclomine (BENTYL) 20 MG tablet TAKE 1 TABLET BY MOUTH FOUR TIMES A DAY AS NEEDED FOR SPASMS   doxazosin (CARDURA) 4 MG tablet Take 2 tablets (8 mg total) by mouth at bedtime.   famotidine (PEPCID) 20 MG tablet TAKE ONE TABLET BY MOUTH TWICE A DAY   ferrous sulfate 325 (65 FE) MG EC tablet Take 325 mg by mouth 2 (two) times a week.   fluticasone (FLONASE) 50 MCG/ACT nasal spray SPRAY TWO SPRAYS IN EACH NOSTRIL TWICE DAILY   MAGNESIUM PO Calcium citrate, magnesium and vitamin D3 combo   mirtazapine (REMERON) 30 MG tablet Take 1 tablet (30 mg total) by mouth at bedtime.   montelukast (SINGULAIR) 10 MG tablet Take 1 tablet (10 mg total) by mouth at bedtime.   Multiple Vitamin (MULTIVITAMIN WITH MINERALS) TABS tablet Take 1 tablet by mouth daily.   Omega-3 Fatty Acids (FISH OIL PO) Take 1,000 mg by mouth daily.   pantoprazole (PROTONIX) 40 MG tablet Take 1 tablet (40 mg total) by mouth 2 (two) times daily.   Zinc 50 MG TABS    Current Facility-Administered Medications for the 05/10/21 encounter (Office Visit) with Caren Macadam, MD  Medication   0.9 %  sodium chloride infusion   0.9 %  sodium chloride infusion   Social History   Tobacco Use   Smoking status: Former    Packs/day: 20.00    Years: 1.00    Pack years: 20.00    Types: Cigarettes    Quit date: 06/19/1999    Years since quitting: 21.9   Smokeless tobacco: Never   Tobacco comments:    20 year estimate but probably less   Substance Use Topics   Alcohol use: Yes    Alcohol/week: 0.0 standard drinks    Comment: occ   Family History  Problem Relation Age of Onset   Heart failure Mother    Hypertension Mother    Hyperlipidemia Mother    Congestive Heart Failure Mother    Other Father        killed   COPD Paternal 43        a lot of aunts and  uncle COPD or Emphysema   Emphysema Paternal Uncle    Colon polyps Maternal Aunt    COPD Brother    Heart disease Brother    Pulmonary embolism Brother    Breast  cancer Neg Hx    Colon cancer Neg Hx    Esophageal cancer Neg Hx    Pancreatic cancer Neg Hx    Stomach cancer Neg Hx    Liver disease Neg Hx    Rectal cancer Neg Hx      Review of Systems  Constitutional:  Positive for fatigue. Negative for activity change, appetite change, chills, fever and unexpected weight change.  HENT:  Negative for congestion, ear pain, hearing loss, sinus pressure, sinus pain, sore throat and trouble swallowing.   Eyes:  Negative for pain and visual disturbance.  Respiratory:  Negative for cough, chest tightness, shortness of breath and wheezing.   Cardiovascular:  Negative for chest pain, palpitations and leg swelling.  Gastrointestinal:  Negative for abdominal pain, blood in stool, constipation, diarrhea, nausea and vomiting.  Genitourinary:  Negative for difficulty urinating and menstrual problem.  Musculoskeletal:  Positive for back pain. Negative for arthralgias.  Skin:  Negative for rash.  Neurological:  Negative for dizziness, weakness, numbness and headaches.  Hematological:  Negative for adenopathy. Does not bruise/bleed easily.  Psychiatric/Behavioral:  Negative for sleep disturbance and suicidal ideas. The patient is not nervous/anxious.    CBC:  Lab Results  Component Value Date   WBC 4.7 04/06/2020   HGB 15.3 04/06/2020   HCT 45.7 (H) 04/06/2020   MCH 31.7 04/06/2020   MCHC 33.5 04/06/2020   RDW 12.8 04/06/2020   PLT 273 04/06/2020   MPV 8.9 04/06/2020   CMP: Lab Results  Component Value Date   NA 142 04/06/2020   K 4.5 04/06/2020   CL 109 04/06/2020   CO2 24 04/06/2020   ANIONGAP 13 05/01/2019   GLUCOSE 80 04/06/2020   BUN 14 04/06/2020   CREATININE 0.94 04/06/2020   GFRAA >60 05/01/2019   CALCIUM 9.6 04/06/2020   PROT 6.3 04/06/2020   BILITOT 0.5 04/06/2020    ALKPHOS 50 05/12/2019   ALT 9 04/06/2020   AST 12 04/06/2020   LIPID: Lab Results  Component Value Date   CHOL 243 (H) 04/06/2020   TRIG 111 04/06/2020   HDL 53 04/06/2020   LDLCALC 166 (H) 04/06/2020    Objective:  BP 110/78 (BP Location: Left Arm, Patient Position: Sitting, Cuff Size: Large)   Pulse 82   Temp 97.8 F (36.6 C) (Oral)   Ht 5' 1.5" (1.562 m)   Wt 173 lb (78.5 kg)   SpO2 97%   BMI 32.16 kg/m   Weight: 173 lb (78.5 kg)   BP Readings from Last 3 Encounters:  05/10/21 110/78  04/11/21 129/72  01/25/21 129/85   Wt Readings from Last 3 Encounters:  05/10/21 173 lb (78.5 kg)  01/25/21 169 lb (76.7 kg)  01/11/21 169 lb (76.7 kg)    Physical Exam Constitutional:      General: She is not in acute distress.    Appearance: She is well-developed.  HENT:     Head: Normocephalic and atraumatic.     Right Ear: External ear normal.     Left Ear: External ear normal.     Mouth/Throat:     Pharynx: No oropharyngeal exudate.  Eyes:     Conjunctiva/sclera: Conjunctivae normal.     Pupils: Pupils are equal, round, and reactive to light.  Neck:     Thyroid: No thyromegaly.  Cardiovascular:     Rate and Rhythm: Normal rate and regular rhythm.     Heart sounds: Normal heart sounds. No murmur heard.   No friction rub. No  gallop.  Pulmonary:     Effort: Pulmonary effort is normal.     Breath sounds: Normal breath sounds.  Abdominal:     General: Bowel sounds are normal. There is no distension.     Palpations: Abdomen is soft. There is no mass.     Tenderness: There is no abdominal tenderness. There is no guarding.     Hernia: No hernia is present.  Genitourinary:    Exam position: Supine.     Urethra: Urethral lesion (inferior to uretrha; per patient has been there years) present.     Vagina: Normal.     Cervix: Normal.     Adnexa: Right adnexa normal and left adnexa normal.     Comments: 60mm white horn/skin tag just below urethral opening Musculoskeletal:         General: No tenderness or deformity. Normal range of motion.     Cervical back: Normal range of motion and neck supple.  Lymphadenopathy:     Cervical: No cervical adenopathy.  Skin:    General: Skin is warm and dry.     Findings: No rash.  Neurological:     Mental Status: She is alert and oriented to person, place, and time.     Deep Tendon Reflexes: Reflexes normal.     Reflex Scores:      Tricep reflexes are 2+ on the right side and 2+ on the left side.      Bicep reflexes are 2+ on the right side and 2+ on the left side.      Brachioradialis reflexes are 2+ on the right side and 2+ on the left side.      Patellar reflexes are 2+ on the right side and 2+ on the left side. Psychiatric:        Speech: Speech normal.        Behavior: Behavior normal.        Thought Content: Thought content normal.    Assessment/Plan: Health Maintenance Due  Topic Date Due   Pneumonia Vaccine 59+ Years old (3 - PPSV23 if available, else PCV20) 08/14/2020   DEXA SCAN  02/12/2021   Health Maintenance reviewed - has mammogram, dexa set up. Prevnar 20 today.  1. Preventative health care Keep up with regular activity. Keep up with healthy eating.   2. Cough variant asthma  vs UACS  Breathing has been stable.   3. Fatigue, unspecified type Start with bloodwork; consider further eval pending results.  - CBC with Differential/Platelet; Future - Comprehensive metabolic panel; Future  4. Hyperlipidemia, unspecified hyperlipidemia type - Lipid panel; Future - TSH; Future  5. B12 deficiency - Vitamin B12; Future - Folate; Future - Methylmalonic acid, serum; Future - Homocysteine; Future  6. Low ferritin - IBC + Ferritin; Future  7. Gastroesophageal reflux disease, unspecified whether esophagitis present Continue with protonix, pepcid. Follow up with GI; suggested discussion of swallowing pills/sticking with GI prior to next EGD.   8. Muscle cramps - Magnesium, RBC; Future  9. Low  vitamin D level - VITAMIN D 25 Hydroxy (Vit-D Deficiency, Fractures); Future  10. Nail abnormality - Zinc; Future  11. Need for pneumococcal vaccination - Pneumococcal conjugate vaccine 20-valent (Prevnar 20)   Return in about 6 months (around 11/07/2021) for Chronic condition visit.  Micheline Rough, MD

## 2021-05-17 ENCOUNTER — Ambulatory Visit: Payer: PPO | Admitting: Pulmonary Disease

## 2021-05-18 ENCOUNTER — Other Ambulatory Visit: Payer: Self-pay

## 2021-05-18 ENCOUNTER — Ambulatory Visit: Payer: PPO | Admitting: Pulmonary Disease

## 2021-05-18 ENCOUNTER — Encounter: Payer: Self-pay | Admitting: Pulmonary Disease

## 2021-05-18 ENCOUNTER — Telehealth: Payer: Self-pay | Admitting: Pharmacist

## 2021-05-18 VITALS — BP 118/72 | HR 88 | Ht 61.5 in | Wt 175.6 lb

## 2021-05-18 DIAGNOSIS — J309 Allergic rhinitis, unspecified: Secondary | ICD-10-CM

## 2021-05-18 DIAGNOSIS — J4540 Moderate persistent asthma, uncomplicated: Secondary | ICD-10-CM | POA: Diagnosis not present

## 2021-05-18 MED ORDER — BUDESONIDE-FORMOTEROL FUMARATE 160-4.5 MCG/ACT IN AERO: 2.0000 | INHALATION_SPRAY | Freq: Two times a day (BID) | RESPIRATORY_TRACT | 11 refills | Status: DC

## 2021-05-18 MED ORDER — IPRATROPIUM BROMIDE 0.03 % NA SOLN: 2.0000 | Freq: Two times a day (BID) | NASAL | 12 refills | Status: DC

## 2021-05-18 NOTE — Patient Instructions (Addendum)
Continue on symbicort 2 puffs twice daily  Continue montelukast daily  Continue Zyrtec daily  Continue as needed albuterol  Continue flonase daily  Switch from astelin to ipratropium nasal spray and monitor for any improvement in sinus congestion and post-nasal drainage.

## 2021-05-18 NOTE — Chronic Care Management (AMB) (Signed)
Chronic Care Management Pharmacy Assistant   Name: Brittany [name]  MRN: [id number] DOB: 1956/04/14  Brittany [name] is an 65 y.o. year old female who presents for his initial CCM visit with the clinical pharmacist.  Reason for Encounter: Appointment reminder and  Chart prep for initial visit with Brittany [name] the Clinical Pharmacist on 112/07/2020 at 1:00 for a face to face visit   Conditions to be addressed/monitored: HLD, COPD, and GERD, PTSD, Bipolar   Recent office visits:  05/10/2021 Brittany Rough MD - Patient was seen for Preventative health care and additional issues. No medication changes. Follow up in 6 months.  04/11/2021 Brittany Rakes LPN - Medicare annual wellness exam.  03/14/2021 Brittany Benton DO [facility]) - Patient was seen for cough and other issues. Started Augmentin 872/125 mg. Follow up with PCP.   10/17/2020 Brittany Penna MD (PCP) - Patient was seen for pharyngitis. No medication changes. No follow up noted.  Recent consult visits:  11/30/2020 Brittany Slade MD (psychiatry) - Patient was seen for PTSD and additional issues. Changed Doxazosin 8 mg from 2 tablets qhs to 1 tablet qhs. Follow up in 3-4 months.   10/25/2020 Brittany Jackson MD (pulmonary) - Patient was seen for Moderate persistent asthma without complication. Started Budeson -Glycopyrrol-Formoterol 160-9-4.8 mcg/ACT, discontinued Rifaximin. Follow up in 4 months.   10/04/2020 Brittany Park MD (gastroenterology) - Patient was seen for frequent stools. Started Rifaximin 550 mg, changed Colestipol to 2gm bid, discontinued Topiramate. No follow up noted.  Hospital visits:  None  Medications: Outpatient Encounter Medications as of 05/18/2021  Medication Sig   albuterol (PROVENTIL) (2.5 MG/3ML) 0.083% nebulizer solution Take 3 mLs (2.5 mg total) by nebulization every 6 (six) hours as needed for wheezing or shortness of breath.   Albuterol Sulfate (PROAIR RESPICLICK) 427 (90  Base) MCG/ACT AEPB Inhale 2 puffs into the lungs every 6 (six) hours as needed.   Ascorbic Acid (VITAMIN C PO) Take by mouth. Super C with vitamin D   azelastine (ASTELIN) 0.1 % nasal spray Place 1 spray into both nostrils 2 (two) times daily. Use in each nostril as directed   b complex vitamins capsule Take 1 capsule by mouth daily.   BIOTIN PO Take by mouth.   busPIRone (BUSPAR) 15 MG tablet Take 1 tablet (15 mg total) by mouth 3 (three) times daily.   cetirizine (ZYRTEC) 10 MG tablet Take 1 tablet (10 mg total) by mouth daily.   Cholecalciferol (VITAMIN D-3 PO) Take 10,000 Units by mouth daily.    colestipol (COLESTID) 1 g tablet Take 2 tablets (2 g total) by mouth 2 (two) times daily.   COLLAGEN PO Take by mouth.   diazepam (VALIUM) 5 MG tablet Take 1 tablet (5 mg total) by mouth every 6 (six) hours as needed. for anxiety   dicyclomine (BENTYL) 20 MG tablet TAKE 1 TABLET BY MOUTH FOUR TIMES A DAY AS NEEDED FOR SPASMS   doxazosin (CARDURA) 4 MG tablet Take 2 tablets (8 mg total) by mouth at bedtime.   famotidine (PEPCID) 20 MG tablet TAKE ONE TABLET BY MOUTH TWICE A DAY   ferrous sulfate 325 (65 FE) MG EC tablet Take 325 mg by mouth 2 (two) times a week.   fluticasone (FLONASE) 50 MCG/ACT nasal spray SPRAY TWO SPRAYS IN EACH NOSTRIL TWICE DAILY   MAGNESIUM PO Calcium citrate, magnesium and vitamin D3 combo   mirtazapine (REMERON) 30 MG tablet Take 1 tablet (30 mg total) by mouth at bedtime.  montelukast (SINGULAIR) 10 MG tablet Take 1 tablet (10 mg total) by mouth at bedtime.   Multiple Vitamin (MULTIVITAMIN WITH MINERALS) TABS tablet Take 1 tablet by mouth daily.   Omega-3 Fatty Acids (FISH OIL PO) Take 1,000 mg by mouth daily.   pantoprazole (PROTONIX) 40 MG tablet Take 1 tablet (40 mg total) by mouth 2 (two) times daily.   Zinc 50 MG TABS    Facility-Administered Encounter Medications as of 05/18/2021  Medication   0.9 %  sodium chloride infusion   0.9 %  sodium chloride infusion    Fill History: ALBUTEROL SULFATE 0.083% NEBULIZATION SOLUTION 05/01/2021 6   AZELASTINE HYDROCHLORIDE 137MCG/SPRAY SOLUTION 04/13/2021 50   COLESTIPOL HCL 1GM TABLET 03/10/2021 30   DICYCLOMINE HYDROCHLORIDE 20MG  TABLET 02/06/2021 90   DOXAZOSIN MESYLATE 4MG  TABLET 04/17/2021 90   MIRTAZAPINE 30MG  TABLET 04/17/2021 90   PANTOPRAZOLE SODIUM 40MG  TABLET DELAYED RELEASE 04/13/2021 90   BUSPIRONE HYDROCHLORIDE 15MG  TABLET 04/14/2021 90   DIAZEPAM 5MG  TABLET 04/14/2021 30    05/19/2021 APPOINTMENT REMINDER Called Brittany [name], Patient notified of appointment on 05/19/2021 at 1:00 via office visit with Brittany [name], Pharm D. Notified to have blood pressure monitor, all medications, supplements, blood pressure and/or blood sugar logs available during appointment and to return call if need to reschedule.   Have you seen any other providers since your last visit? **no  Any changes in your medications or health? no  Any side effects from any medications?   Yes, Doxazosin causing some dizziness, Colestid is becoming hard to swallow.  Do you have an symptoms or problems not managed by your medications?  Yes, she still has some depression and her anxiety is sky high, no medications work, she is very sensitive to medication.  She does see a psychologist.  Any concerns about your health right now?   Yes, patient would like to loose some weight. Patient does follow a ketogenic diet which helps with her GI issue.   Has your provider asked that you check blood pressure, blood sugar, or follow special diet at home?   Yes, patient is checking her blood pressures 1-2 times per month and her blood pressures are around 117/72  Do you get any type of exercise on a regular basis?   Yes, helping with her grandson 3 days a week, this includes up and down stairs and doing her own house work.  Can you think of a goal you would like to reach for your health?  Patient would like to  discuss getting healthier and would like to be able to do things without being winded.  Do you have any problems getting your medications?  Yes, Symbicort, patient was getting PAP and would like to do this again.  Is there anything that you would like to discuss during the appointment?  Yes, patient would like to feel better and find a way she could come off some of her medications  Please bring blood pressure monitor, medications and supplements to appointment  Care Gaps: AWV - completed on 04/11/2021 Last blood pressure - 110/78 on 11/23 Dexascan - overdue  Star Rating Drugs: None  [place] Pharmacist Assistant [phone]

## 2021-05-18 NOTE — Progress Notes (Signed)
Synopsis: Referred in 2018 for asthma by Caren Macadam, MD.  Previously patient of Dr. Melvyn Novas and Dr. Gwenette Greet.  Subjective:   PATIENT ID: Brittany [name] GENDER: female DOB: 01-Feb-1956, MRN: [id number]  Chief Complaint  Patient presents with   Follow-up    F/U for asthma. States her breathing has been stable since last visit.      HPI Brittany [name] is a 65 year old woman, former smoker with covid 19 pneumonia in 2020 and asthma who returns to pulmonary clinic for follow up.   She has done well since last visit. She is using symbicort 160-4.58mcg 2 puffs twice daily and as needed albuterol.   No issues with allergies currently. She remains on zyrtec, singulair, flonase and astelin.    Lab Results  Component Value Date   NITRICOXIDE 14 07/16/2016    Past Medical History:  Diagnosis Date   Abnormal EKG    Allergy    Anxiety    Asthma    Bipolar depression ([place]), Anxiety, PTSD, Panic disorder    -managed by [facility]   Depression    Foot pain    GERD (gastroesophageal reflux disease)    Heart murmur    as a child   Insomnia    Leg swelling    Osteoporosis    osteopenia   Pneumonia due to COVID-19 virus 04/22/2019   Tachycardia      Family History  Problem Relation Age of Onset   Heart failure Mother    Hypertension Mother    Hyperlipidemia Mother    Congestive Heart Failure Mother    Other Father        killed   COPD Paternal 90        a lot of aunts and uncle COPD or Emphysema   Emphysema Paternal Uncle    Colon polyps Maternal Aunt    COPD Brother    Heart disease Brother    Pulmonary embolism Brother    Breast cancer Neg Hx    Colon cancer Neg Hx    Esophageal cancer Neg Hx    Pancreatic cancer Neg Hx    Stomach cancer Neg Hx    Liver disease Neg Hx    Rectal cancer Neg Hx      Past Surgical History:  Procedure Laterality Date   BREAST BIOPSY Right    biopsy of nipple   CARPAL TUNNEL RELEASE Left    CESAREAN SECTION      x 2   CHOLECYSTECTOMY     COLONOSCOPY     ENDOMETRIAL ABLATION     KNEE SURGERY Right    reconstruction for patellar dislocation   LAPAROSCOPY     x 2   TUBAL LIGATION     ULNAR NERVE REPAIR Left    UPPER GASTROINTESTINAL ENDOSCOPY      Social History   Socioeconomic History   Marital status: Single    Spouse name: Not on file   Number of children: 3   Years of education: Not on file   Highest education level: Not on file  Occupational History   Occupation: disabled  Tobacco Use   Smoking status: Former    Packs/day: 20.00    Years: 1.00    Pack years: 20.00    Types: Cigarettes    Quit date: 06/19/1999    Years since quitting: 21.9   Smokeless tobacco: Never   Tobacco comments:    20 year estimate but probably less   Vaping Use  Vaping Use: Never used  Substance and Sexual Activity   Alcohol use: Yes    Alcohol/week: 0.0 standard drinks    Comment: occ   Drug use: No   Sexual activity: Never  Other Topics Concern   Not on file  Social History Narrative   Work or School: Disabled seconndary to psychiatric conditions      Home Situation: lives alone with 2 cats and one dog      Spiritual Beliefs: Christian      Lifestyle: no regular exercise, diet  Not great - wants to embark on healthier lifestyle   Social Determinants of Health   Financial Resource Strain: Low Risk    Difficulty of Paying Living Expenses: Not hard at all  Food Insecurity: No Food Insecurity   Worried About Charity fundraiser in the Last Year: Never true   Arboriculturist in the Last Year: Never true  Transportation Needs: No Transportation Needs   Lack of Transportation (Medical): No   Lack of Transportation (Non-Medical): No  Physical Activity: Inactive   Days of Exercise per Week: 0 days   Minutes of Exercise per Session: 0 min  Stress: Stress Concern Present   Feeling of Stress : To some extent  Social Connections: Moderately Isolated   Frequency of Communication with Friends  and Family: More than three times a week   Frequency of Social Gatherings with Friends and Family: More than three times a week   Attends Religious Services: 1 to 4 times per year   Active Member of Clubs or Organizations: No   Attends Archivist Meetings: Never   Marital Status: Never married  Human resources officer Violence: Not At Risk   Fear of Current or Ex-Partner: No   Emotionally Abused: No   Physically Abused: No   Sexually Abused: No     Allergies  Allergen Reactions   Aspirin Other (See Comments)    discomfort   Codeine Itching   Niacin And Related Itching   Sulfamethoxazole-Trimethoprim Other (See Comments)    Unknown reaction per pt   Topiramate Other (See Comments)    Per patient, it causes stomach burning   Trileptal [Oxcarbazepine]     itching   Trimethoprim Other (See Comments)    Unknown, per pt   Ultram [Tramadol] Itching   Vioxx [Rofecoxib]     Unknown per pt     Immunization History  Administered Date(s) Administered   Fluad Quad(high Dose 65+) 02/28/2021   Influenza Split 04/26/2011   Influenza Whole 03/18/2009, 03/18/2010, 03/18/2016   Influenza,inj,Quad PF,6+ Mos 04/19/2014, 02/28/2017, 03/06/2018, 02/13/2019, 03/04/2020   PFIZER(Purple Top)SARS-COV-2 Vaccination 09/04/2019, 09/29/2019, 04/06/2020, 04/09/2021   PNEUMOCOCCAL CONJUGATE-20 05/10/2021   Pneumococcal Conjugate-13 03/10/2015   Pneumococcal Polysaccharide-23 06/18/2005   Tdap 12/04/2016   Zoster Recombinat (Shingrix) 01/01/2018, 06/22/2018    Outpatient Medications Prior to Visit  Medication Sig Dispense Refill   albuterol (PROVENTIL) (2.5 MG/3ML) 0.083% nebulizer solution Take 3 mLs (2.5 mg total) by nebulization every 6 (six) hours as needed for wheezing or shortness of breath. 75 mL 12   Albuterol Sulfate (PROAIR RESPICLICK) 229 (90 Base) MCG/ACT AEPB Inhale 2 puffs into the lungs every 6 (six) hours as needed. 1 each 6   Ascorbic Acid (VITAMIN C PO) Take by mouth. Super C  with vitamin D     b complex vitamins capsule Take 1 capsule by mouth daily.     BIOTIN PO Take by mouth.     budesonide-formoterol (SYMBICORT) 160-4.5  MCG/ACT inhaler Inhale 2 puffs into the lungs 2 (two) times daily.     busPIRone (BUSPAR) 15 MG tablet Take 1 tablet (15 mg total) by mouth 3 (three) times daily. 270 tablet 1   cetirizine (ZYRTEC) 10 MG tablet Take 1 tablet (10 mg total) by mouth daily. 90 tablet 1   Cholecalciferol (VITAMIN D-3 PO) Take 10,000 Units by mouth daily.      colestipol (COLESTID) 1 g tablet Take 2 tablets (2 g total) by mouth 2 (two) times daily. 360 tablet 3   COLLAGEN PO Take by mouth.     diazepam (VALIUM) 5 MG tablet Take 1 tablet (5 mg total) by mouth every 6 (six) hours as needed. for anxiety 120 tablet 5   dicyclomine (BENTYL) 20 MG tablet TAKE 1 TABLET BY MOUTH FOUR TIMES A DAY AS NEEDED FOR SPASMS 360 tablet 3   doxazosin (CARDURA) 4 MG tablet Take 2 tablets (8 mg total) by mouth at bedtime. 180 tablet 1   famotidine (PEPCID) 20 MG tablet TAKE ONE TABLET BY MOUTH TWICE A DAY 180 tablet 3   ferrous sulfate 325 (65 FE) MG EC tablet Take 325 mg by mouth 2 (two) times a week.     fluticasone (FLONASE) 50 MCG/ACT nasal spray SPRAY TWO SPRAYS IN EACH NOSTRIL TWICE DAILY 96 mL 2   MAGNESIUM PO Calcium citrate, magnesium and vitamin D3 combo     mirtazapine (REMERON) 30 MG tablet Take 1 tablet (30 mg total) by mouth at bedtime. 90 tablet 1   montelukast (SINGULAIR) 10 MG tablet Take 1 tablet (10 mg total) by mouth at bedtime. 90 tablet 1   Multiple Vitamin (MULTIVITAMIN WITH MINERALS) TABS tablet Take 1 tablet by mouth daily.     Omega-3 Fatty Acids (FISH OIL PO) Take 1,000 mg by mouth daily.     pantoprazole (PROTONIX) 40 MG tablet Take 1 tablet (40 mg total) by mouth 2 (two) times daily. 180 tablet 3   Zinc 50 MG TABS      azelastine (ASTELIN) 0.1 % nasal spray Place 1 spray into both nostrils 2 (two) times daily. Use in each nostril as directed 30 mL 12    Facility-Administered Medications Prior to Visit  Medication Dose Route Frequency Provider Last Rate Last Admin   0.9 %  sodium chloride infusion  500 mL Intravenous Once Thornton Park, MD       0.9 %  sodium chloride infusion  500 mL Intravenous Once Thornton Park, MD        Review of Systems  Constitutional:  Negative for chills, diaphoresis, fever, malaise/fatigue and weight loss.  HENT:  Negative for congestion and nosebleeds.   Eyes: Negative.   Respiratory:  Negative for cough, hemoptysis, sputum production, shortness of breath and wheezing.   Cardiovascular:  Negative for chest pain, palpitations, orthopnea, claudication and leg swelling.  Gastrointestinal:  Negative for heartburn and nausea.  Genitourinary: Negative.   Musculoskeletal:  Negative for joint pain and myalgias.  Neurological:  Negative for dizziness, weakness and headaches.  Endo/Heme/Allergies:  Positive for environmental allergies.  Psychiatric/Behavioral: Negative.     Objective:   Vitals:   05/18/21 1627  BP: 118/72  Pulse: 88  SpO2: 96%  Weight: 175 lb 9.6 oz (79.7 kg)  Height: 5' 1.5" (1.562 m)   96% on  RA BMI Readings from Last 3 Encounters:  05/18/21 32.64 kg/m  05/10/21 32.16 kg/m  01/25/21 30.91 kg/m   Wt Readings from Last 3 Encounters:  05/18/21 175  lb 9.6 oz (79.7 kg)  05/10/21 173 lb (78.5 kg)  01/25/21 169 lb (76.7 kg)    Physical Exam Vitals reviewed.  Constitutional:      General: She is not in acute distress.    Appearance: She is obese. She is not ill-appearing.  HENT:     Head: Normocephalic and atraumatic.  Eyes:     General: No scleral icterus. Cardiovascular:     Rate and Rhythm: Normal rate and regular rhythm.     Heart sounds: No murmur heard. Pulmonary:     Effort: Pulmonary effort is normal.     Breath sounds: Normal breath sounds. No wheezing, rhonchi or rales.  Abdominal:     General: There is no distension.     Palpations: Abdomen is soft.   Musculoskeletal:     Cervical back: Neck supple.     Right lower leg: No edema.     Left lower leg: No edema.  Lymphadenopathy:     Cervical: No cervical adenopathy.  Skin:    General: Skin is warm and dry.     Findings: No rash.  Neurological:     General: No focal deficit present.     Mental Status: She is alert.     Coordination: Coordination normal.  Psychiatric:        Mood and Affect: Mood normal.        Behavior: Behavior normal.   CBC    Component Value Date/Time   WBC 4.4 05/10/2021 0934   RBC 5.04 05/10/2021 0934   HGB 15.5 (H) 05/10/2021 0934   HCT 45.9 05/10/2021 0934   PLT 318.0 05/10/2021 0934   MCV 91.1 05/10/2021 0934   MCH 31.7 04/06/2020 0916   MCHC 33.8 05/10/2021 0934   RDW 14.6 05/10/2021 0934   LYMPHSABS 0.8 05/10/2021 0934   MONOABS 0.3 05/10/2021 0934   EOSABS 0.1 05/10/2021 0934   BASOSABS 0.0 05/10/2021 0934   Chest Imaging- films reviewed: HRCT Chest 03/2020  Moderate patchy ground glass opacity, reticulation and septal thickening throughout both lungs with associated mild traction bronchiectasis and arcitectural distortion, with an upper lobe predominance, asymmetrically prominent on the left. No frank honeycombing. Previously noted acute opacities on 04/22/2019 chest CTA have improved/resolved while the fibrotic components are new.   CXR, 2 view 05/12/2019-left greater than right opacities and reticulation.  Possible posterior pleural effusion silhouetting hemidiaphragm  CTA chest 04/22/2019-left greater than right peripheral groundglass opacities.  No PE.  CXR 02/11/2020-increased interstitial markings throughout  Echocardiogram 01/26/20: LVEF 60 to 65%, no regional wall motion abnormalities.  Mild concentric LVH, moderate basal septal hypertrophy.  Normal LA.  RV with mildly reduced function, normal size.  Normal RA.  Normal valves.  Pulmonary Functions Testing Results: PFT Results Latest Ref Rng & Units 10/25/2020 09/03/2019  FVC-Pre L 2.74  2.55  FVC-Predicted Pre % 93 86  FVC-Post L 2.90 2.71  FVC-Predicted Post % 99 91  Pre FEV1/FVC % % 70 70  Post FEV1/FCV % % 69 67  FEV1-Pre L 1.92 1.78  FEV1-Predicted Pre % 85 78  FEV1-Post L 2.00 1.83  DLCO uncorrected ml/min/mmHg 19.95 16.97  DLCO UNC% % 107 91  DLCO corrected ml/min/mmHg 19.95 16.97  DLCO COR %Predicted % 107 91  DLVA Predicted % 105 98  TLC L 5.00 4.48  TLC % Predicted % 105 94  RV % Predicted % 105 99   2021- mild obstruction, no bronchodilator reversibility.  No restriction, hyperinflation, or air trapping.  Normal diffusion.  2022 - mild obstruction present.   Assessment & Plan:     ICD-10-CM   1. Moderate persistent asthma without complication  P50.93     2. 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 At prior visit, spoke with the patient and daughter at length that these fibrotic changes are likely from Covid and we will monitor for any concerns of disease progression but this will not likely follow the path of UIP or true pulmonary fibrosis. - PFTs without restrictive or diffsuion defects in 08/2019 and remain stable today  Allergic rhinosinusitis -Continue Zyrtec - flonase daily  - change to ipratropium nasasl spray from azelastine nasal spray daily  Follow up in 6 months.  Freda Jackson, MD [facility] Office: [phone]     Current Outpatient Medications:    albuterol (PROVENTIL) (2.5 MG/3ML) 0.083% nebulizer solution, Take 3 mLs (2.5 mg total) by nebulization every 6 (six) hours as needed for wheezing or shortness of breath., Disp: 75 mL, Rfl: 12   Albuterol Sulfate (PROAIR RESPICLICK) 983 (90 Base) MCG/ACT AEPB, Inhale 2 puffs into the lungs every 6 (six) hours as needed., Disp: 1  each, Rfl: 6   Ascorbic Acid (VITAMIN C PO), Take by mouth. Super C with vitamin D, Disp: , Rfl:    b complex vitamins capsule, Take 1 capsule by mouth daily., Disp: , Rfl:    BIOTIN PO, Take by mouth., Disp: , Rfl:    budesonide-formoterol (SYMBICORT) 160-4.5 MCG/ACT inhaler, Inhale 2 puffs into the lungs 2 (two) times daily., Disp: , Rfl:    busPIRone (BUSPAR) 15 MG tablet, Take 1 tablet (15 mg total) by mouth 3 (three) times daily., Disp: 270 tablet, Rfl: 1   cetirizine (ZYRTEC) 10 MG tablet, Take 1 tablet (10 mg total) by mouth daily., Disp: 90 tablet, Rfl: 1   Cholecalciferol (VITAMIN D-3 PO), Take 10,000 Units by mouth daily. , Disp: , Rfl:    colestipol (COLESTID) 1 g tablet, Take 2 tablets (2 g total) by mouth 2 (two) times daily., Disp: 360 tablet, Rfl: 3   COLLAGEN PO, Take by mouth., Disp: , Rfl:    diazepam (VALIUM) 5 MG tablet, Take 1 tablet (5 mg total) by mouth every 6 (six) hours as needed. for anxiety, Disp: 120 tablet, Rfl: 5   dicyclomine (BENTYL) 20 MG tablet, TAKE 1 TABLET BY MOUTH FOUR TIMES A DAY AS NEEDED FOR SPASMS, Disp: 360 tablet, Rfl: 3   doxazosin (CARDURA) 4 MG tablet, Take 2 tablets (8 mg total) by mouth at bedtime., Disp: 180 tablet, Rfl: 1   famotidine (PEPCID) 20 MG tablet, TAKE ONE TABLET BY MOUTH TWICE A DAY, Disp: 180 tablet, Rfl: 3   ferrous sulfate 325 (65 FE) MG EC tablet, Take 325 mg by mouth 2 (two) times a week., Disp: , Rfl:    fluticasone (FLONASE) 50 MCG/ACT nasal spray, SPRAY TWO SPRAYS IN EACH NOSTRIL TWICE DAILY, Disp: 96 mL, Rfl: 2   ipratropium (ATROVENT) 0.03 % nasal spray, Place 2 sprays into both nostrils every 12 (twelve) hours., Disp: 30 mL, Rfl: 12   MAGNESIUM PO, Calcium citrate, magnesium and vitamin D3 combo, Disp: , Rfl:    mirtazapine (REMERON) 30 MG tablet, Take 1 tablet (30 mg total) by mouth at bedtime., Disp: 90 tablet, Rfl: 1  montelukast (SINGULAIR) 10 MG tablet, Take 1 tablet (10 mg total) by mouth at bedtime., Disp: 90 tablet,  Rfl: 1   Multiple Vitamin (MULTIVITAMIN WITH MINERALS) TABS tablet, Take 1 tablet by mouth daily., Disp: , Rfl:    Omega-3 Fatty Acids (FISH OIL PO), Take 1,000 mg by mouth daily., Disp: , Rfl:    pantoprazole (PROTONIX) 40 MG tablet, Take 1 tablet (40 mg total) by mouth 2 (two) times daily., Disp: 180 tablet, Rfl: 3   Zinc 50 MG TABS, , Disp: , Rfl:   Current Facility-Administered Medications:    0.9 %  sodium chloride infusion, 500 mL, Intravenous, Once, Thornton Park, MD   0.9 %  sodium chloride infusion, 500 mL, Intravenous, Once, Thornton Park, MD

## 2021-05-19 ENCOUNTER — Ambulatory Visit (INDEPENDENT_AMBULATORY_CARE_PROVIDER_SITE_OTHER): Payer: PPO | Admitting: Pharmacist

## 2021-05-19 DIAGNOSIS — K219 Gastro-esophageal reflux disease without esophagitis: Secondary | ICD-10-CM

## 2021-05-19 DIAGNOSIS — E785 Hyperlipidemia, unspecified: Secondary | ICD-10-CM

## 2021-05-19 NOTE — Progress Notes (Signed)
Chronic Care Management Pharmacy Note  06/01/2021 Name:  Brittany [name] MRN:  [id number] DOB:  18-Jan-1956  Summary: LDL not at goal < 100 Pt has concerns about cost of some medications  Recommendations/Changes made from today's visit: -Consider applying for Medicare extra help -Recommend genesight testing -Recommended CAC score to determine benefit of statin -Recommended decreasing vitamin D to 3 capsules daily and stopping super C supplement  Plan: Follow up after discussion with PCP   Subjective: Brittany [name] is an 65 y.o. year old female who is a primary patient of Koberlein, Steele Berg, MD.  The CCM team was consulted for assistance with disease management and care coordination needs.    Engaged with patient face to face for initial visit in response to provider referral for pharmacy case management and/or care coordination services.   Consent to Services:  The patient was given the following information about Chronic Care Management services today, agreed to services, and gave verbal consent: 1. CCM service includes personalized support from designated clinical staff supervised by the primary care provider, including individualized plan of care and coordination with other care providers 2. 24/7 contact phone numbers for assistance for urgent and routine care needs. 3. Service will only be billed when office clinical staff spend 20 minutes or more in a month to coordinate care. 4. Only one practitioner may furnish and bill the service in a calendar month. 5.The patient may stop CCM services at any time (effective at the end of the month) by phone call to the office staff. 6. The patient will be responsible for cost sharing (co-pay) of up to 20% of the service fee (after annual deductible is met). Patient agreed to services and consent obtained.  Patient Care Team: Caren Macadam, MD as PCP - General (Family Medicine) Josue Hector, MD as PCP - Cardiology  (Cardiology) Viona Gilmore, [facility] as Pharmacist (Pharmacist)  Recent office visits: 05/10/2021 Micheline Rough MD - Patient was seen for Preventative health care and additional issues. No medication changes. Follow up in 6 months.   04/11/2021 Charlott Rakes LPN - Medicare annual wellness exam.   03/14/2021 Colin Benton DO [facility] Medicine) - Patient was seen for cough and other issues. Started Augmentin 872/125 mg. Follow up with PCP.  Recent consult visits: 05/18/21 Freda Jackson, MD (pulmonary): Patient presented for asthma follow up. Switched azelastine to ipratropium nasal spay. Follow up in 6 months.  11/30/2020 Trula Slade MD (psychiatry) - Patient was seen for PTSD and additional issues. Changed Doxazosin 8 mg from 2 tablets qhs to 1 tablet qhs. Follow up in 3-4 months.   10/25/2020 Freda Jackson MD (pulmonary) - Patient was seen for Moderate persistent asthma without complication. Started Budeson -Glycopyrrol-Formoterol 160-9-4.8 mcg/ACT, discontinued Rifaximin. Follow up in 4 months.   10/04/2020 Thornton Park MD (gastroenterology) - Patient was seen for frequent stools. Started Rifaximin 550 mg, changed Colestipol to 2gm bid, discontinued Topiramate. No follow up noted.  Hospital visits: None in previous 6 months   Objective:  Lab Results  Component Value Date   CREATININE 0.67 05/10/2021   BUN 12 05/10/2021   GFR 91.52 05/10/2021   GFRNONAA >60 05/01/2019   GFRAA >60 05/01/2019   NA 141 05/10/2021   K 4.4 05/10/2021   CALCIUM 9.2 05/10/2021   CO2 29 05/10/2021   GLUCOSE 97 05/10/2021    Lab Results  Component Value Date/Time   HGBA1C 4.7 04/06/2020 09:16 AM   HGBA1C 4.9 11/28/2018 09:30 AM  GFR 91.52 05/10/2021 09:34 AM   GFR 73.16 02/11/2020 12:37 PM    Last diabetic Eye exam: No results found for: HMDIABEYEEXA  Last diabetic Foot exam: No results found for: HMDIABFOOTEX   Lab Results  Component Value Date   CHOL 243 (H) 05/10/2021   HDL  70.90 05/10/2021   LDLCALC 158 (H) 05/10/2021   TRIG 72.0 05/10/2021   CHOLHDL 3 05/10/2021    Hepatic Function Latest Ref Rng & Units 05/10/2021 04/06/2020 05/12/2019  Total Protein 6.0 - 8.3 g/dL 7.1 6.3 5.7(L)  Albumin 3.5 - 5.2 g/dL 4.7 - 3.5  AST 0 - 37 U/L _0 ALT 0 - 35 U/L 14 9 34  Alk Phosphatase 39 - 117 U/L 58 - 50  Total Bilirubin 0.2 - 1.2 mg/dL 0.4 0.5 0.6    Lab Results  Component Value Date/Time   TSH 1.75 05/10/2021 09:34 AM   TSH 1.52 04/06/2020 09:16 AM    CBC Latest Ref Rng & Units 05/10/2021 04/06/2020 02/11/2020  WBC 4.0 - 10.5 K/uL 4.4 4.7 5.2  Hemoglobin 12.0 - 15.0 g/dL 15.5(H) 15.3 15.3(H)  Hematocrit 36.0 - 46.0 % 45.9 45.7(H) 44.5  Platelets 150.0 - 400.0 K/uL 318.0 273 321.0    Lab Results  Component Value Date/Time   VD25OH 87.95 05/10/2021 09:34 AM   VD25OH 56 04/06/2020 09:16 AM   VD25OH 45.19 11/28/2018 09:30 AM    Clinical ASCVD: No  The 10-year ASCVD risk score (Arnett DK, et al., 2019) is: 4.8%   Values used to calculate the score:     Age: 37 years     Sex: Female     Is Non-Hispanic African American: No     Diabetic: No     Tobacco smoker: No     Systolic Blood Pressure: 921 mmHg     Is BP treated: No     HDL Cholesterol: 70.9 mg/dL     Total Cholesterol: 243 mg/dL    Depression screen [facility] 2/9 05/10/2021 04/11/2021 04/05/2020  Decreased Interest 1 0 1  Down, Depressed, Hopeless _1 PHQ - 2 Score _2 Altered sleeping 0 - 0  Tired, decreased energy 3 - 0  Change in appetite 2 - 1  Feeling bad or failure about yourself  1 - 0  Trouble concentrating 1 - 1  Moving slowly or fidgety/restless 2 - 0  Suicidal thoughts 0 - 0  PHQ-9 Score 11 - 4  Difficult doing work/chores - - -  Some recent data might be hidden     Social History   Tobacco Use  Smoking Status Former   Packs/day: 20.00   Years: 1.00   Pack years: 20.00   Types: Cigarettes   Quit date: 06/19/1999   Years since quitting: 21.9  Smokeless  Tobacco Never  Tobacco Comments   20 year estimate but probably less    BP Readings from Last 3 Encounters:  05/18/21 118/72  05/10/21 110/78  04/11/21 129/72   Pulse Readings from Last 3 Encounters:  05/18/21 88  05/10/21 82  04/11/21 74   Wt Readings from Last 3 Encounters:  05/18/21 175 lb 9.6 oz (79.7 kg)  05/10/21 173 lb (78.5 kg)  01/25/21 169 lb (76.7 kg)   BMI Readings from Last 3 Encounters:  05/18/21 32.64 kg/m  05/10/21 32.16 kg/m  01/25/21 30.91 kg/m    Assessment/Interventions: Review of patient past medical history, allergies, medications, health status, including review of consultants reports, laboratory and  other test data, was performed as part of comprehensive evaluation and provision of chronic care management services.   SDOH:  (Social Determinants of Health) assessments and interventions performed: Yes SDOH Interventions    Flowsheet Row Most Recent Value  SDOH Interventions   Financial Strain Interventions Intervention Not Indicated  Transportation Interventions Intervention Not Indicated      SDOH Screenings   Alcohol Screen: Not on file  Depression (PHQ2-9): Medium Risk   PHQ-2 Score: 11  Financial Resource Strain: Low Risk    Difficulty of Paying Living Expenses: Not hard at all  Food Insecurity: No Food Insecurity   Worried About Charity fundraiser in the Last Year: Never true   Ran Out of Food in the Last Year: Never true  Housing: Low Risk    Last Housing Risk Score: 0  Physical Activity: Inactive   Days of Exercise per Week: 0 days   Minutes of Exercise per Session: 0 min  Social Connections: Moderately Isolated   Frequency of Communication with Friends and Family: More than three times a week   Frequency of Social Gatherings with Friends and Family: More than three times a week   Attends Religious Services: 1 to 4 times per year   Active Member of [employer] or Organizations: No   Attends Archivist Meetings: Never    Marital Status: Never married  Stress: Stress Concern Present   Feeling of Stress : To some extent  Tobacco Use: Medium Risk   Smoking Tobacco Use: Former   Smokeless Tobacco Use: Never   Passive Exposure: Not on file  Transportation Needs: No Transportation Needs   Lack of Transportation (Medical): No   Lack of Transportation (Non-Medical): No    Patient reports her biggest concerns right now are around cost of medications changing for next year. She reports the colestipol is very large and it is going up to $90 next year. She inquired about switching to something else. She is also pretty sure the doxazosin is going up in price as well.  Patient reports she is also going to try a journal for medications to see if she can come off some of them, particularly for her asthma/allergy medications. She feels like she may be taking too many and is not sure what is helping so she is going to try that.   Patient follows a ketogenic diet which she feels like works for her. She is interested in switching to [facility]  which gives her a max of 3 vegetables per week as she likes vegetables.  CCM Care Plan  Allergies  Allergen Reactions   Aspirin Other (See Comments)    discomfort   Codeine Itching   Niacin And Related Itching   Sulfamethoxazole-Trimethoprim Other (See Comments)    Unknown reaction per pt   Topiramate Other (See Comments)    Per patient, it causes stomach burning   Trileptal [Oxcarbazepine]     itching   Trimethoprim Other (See Comments)    Unknown, per pt   Ultram [Tramadol] Itching   Vioxx [Rofecoxib]     Unknown per pt    Medications Reviewed Today     Reviewed by Viona Gilmore, [facility] (Pharmacist) on 05/19/21 at 1430  Med List Status: <None>   Medication Order Taking? Sig Documenting Provider Last Dose Status Informant  0.9 %  sodium chloride infusion [id number]   Thornton Park, MD  Active   0.9 %  sodium chloride infusion [id number]   Thornton Park, MD  Active   albuterol (PROVENTIL) (2.5 MG/3ML) 0.083% nebulizer solution [id number] Yes Take 3 mLs (2.5 mg total) by nebulization every 6 (six) hours as needed for wheezing or shortness of breath. Freddi Starr, MD Taking Active   Albuterol Sulfate (PROAIR RESPICLICK) 185 (90 Base) MCG/ACT AEPB [id number] Yes Inhale 2 puffs into the lungs every 6 (six) hours as needed. Freddi Starr, MD Taking Active   Ascorbic Acid (VITAMIN C PO) [id number]  Take by mouth. Super C with vitamin D [provider]  Active   b complex vitamins capsule [id number]  Take 1 capsule by mouth daily. [provider]  Active   BIOTIN PO [id number] Yes Take 5,000 mcg by mouth daily. [provider] Taking Active   budesonide-formoterol (SYMBICORT) 160-4.5 MCG/ACT inhaler [id number] Yes Inhale 2 puffs into the lungs 2 (two) times daily. Freddi Starr, MD Taking Active   busPIRone (BUSPAR) 15 MG tablet [id number] Yes Take 1 tablet (15 mg total) by mouth 3 (three) times daily. Cottle, Billey Co., MD Taking Active   cetirizine (ZYRTEC) 10 MG tablet [id number] Yes Take 1 tablet (10 mg total) by mouth daily. [name], DO Taking Active   Cholecalciferol (VITAMIN D-3 PO) [id number]  Take 10,000 Units by mouth daily.  [provider]  Active Self  colestipol (COLESTID) 1 g tablet [id number] Yes Take 2 tablets (2 g total) by mouth 2 (two) times daily. Thornton Park, MD Taking Active   COLLAGEN PO [id number]  Take by mouth. [provider]  Active   diazepam (VALIUM) 5 MG tablet [id number] Yes Take 1 tablet (5 mg total) by mouth every 6 (six) hours as needed. for anxiety Cottle, Billey Co., MD Taking Active   dicyclomine (BENTYL) 20 MG tablet [id number] Yes TAKE 1 TABLET BY MOUTH FOUR TIMES A DAY AS NEEDED FOR Marlene Lard, MD Taking Active   doxazosin (CARDURA) 4 MG tablet [id number] Yes Take 2 tablets (8 mg total) by mouth at bedtime.  Patient taking differently: Take 4 mg  by mouth at bedtime.   Cottle, Billey Co., MD Taking Active   famotidine (PEPCID) 20 MG tablet [id number] Yes TAKE ONE TABLET BY MOUTH TWICE A DAY Freddi Starr, MD Taking Active   ferrous sulfate 325 (65 FE) MG EC tablet [id number] Yes Take 325 mg by mouth 2 (two) times a week. [provider] Taking Active   fluticasone (FLONASE) 50 MCG/ACT nasal spray [id number] Yes SPRAY TWO SPRAYS IN EACH NOSTRIL TWICE DAILY Freddi Starr, MD Taking Active   ipratropium (ATROVENT) 0.03 % nasal spray [id number] No Place 2 sprays into both nostrils every 12 (twelve) hours.  Patient not taking: Reported on 05/19/2021   Freddi Starr, MD Not Taking Active   MAGNESIUM PO [id number] Yes Calcium citrate, magnesium and vitamin D3 combo [provider] Taking Active   mirtazapine (REMERON) 30 MG tablet [id number] Yes Take 1 tablet (30 mg total) by mouth at bedtime. Cottle, Billey Co., MD Taking Active   montelukast (SINGULAIR) 10 MG tablet [id number] Yes Take 1 tablet (10 mg total) by mouth at bedtime. Freddi Starr, MD Taking Active   Multiple Vitamin (MULTIVITAMIN WITH MINERALS) TABS tablet [id number] Yes Take 1 tablet by mouth daily. [provider] Taking Active   Omega-3 Fatty Acids (FISH OIL PO) [id number]  Take 1,000 mg by mouth daily. [provider]  Active   pantoprazole (PROTONIX) 40 MG tablet [id number] Yes Take 1 tablet (40 mg total)  by mouth 2 (two) times daily. Thornton Park, MD Taking Active   sodium chloride (OCEAN) 0.65 % SOLN nasal spray [id number] Yes Place 1 spray into both nostrils as needed for congestion. [provider] Taking Active   Zinc 50 MG TABS [id number] Yes  [provider] Taking Active             Patient Active Problem List   Diagnosis Date Noted   Clotting disorder ([place]) 01/24/2021   Pulmonary fibrosis ([place]) 04/06/2020   Dizziness 02/11/2020   Melena 04/22/2019   Suprapubic abdominal pain 04/22/2019    Osteopenia 12/04/2016   Chronic post-traumatic stress disorder (PTSD) 08/23/2016   Bipolar affective disorder, current episode depressed ([place]) 08/23/2016   Hyperlipidemia 08/23/2016   COPD / AB vs ACOS 06/22/2016   Cough variant asthma  vs UACS  06/21/2016   GERD 04/03/2007    Immunization History  Administered Date(s) Administered   Fluad Quad(high Dose 65+) 02/28/2021   Influenza Split 04/26/2011   Influenza Whole 03/18/2009, 03/18/2010, 03/18/2016   Influenza,inj,Quad PF,6+ Mos 04/19/2014, 02/28/2017, 03/06/2018, 02/13/2019, 03/04/2020   PFIZER(Purple Top)SARS-COV-2 Vaccination 09/04/2019, 09/29/2019, 04/06/2020, 04/09/2021   PNEUMOCOCCAL CONJUGATE-20 05/10/2021   Pneumococcal Conjugate-13 03/10/2015   Pneumococcal Polysaccharide-23 06/18/2005   Tdap 12/04/2016   Zoster Recombinat (Shingrix) 01/01/2018, 06/22/2018    Conditions to be addressed/monitored:  Hyperlipidemia, GERD, COPD, Asthma, Osteopenia, and PTSD, Bipolar disorder  Care Plan : CCM Pharmacy Care Plan  Updates made by Viona Gilmore, [place] since 06/01/2021 12:00 AM     Problem: Problem: Hyperlipidemia, GERD, COPD, Asthma, Osteopenia, and PTSD, Bipolar disorder      Long-Range Goal: Patient-Specific Goal   Start Date: 05/19/2021  Expected End Date: 05/19/2022  This Visit's Progress: On track  Priority: High  Note:   Current Barriers:  Unable to independently afford treatment regimen Unable to independently monitor therapeutic efficacy  Pharmacist Clinical Goal(s):  Patient will verbalize ability to afford treatment regimen achieve adherence to monitoring guidelines and medication adherence to achieve therapeutic efficacy through collaboration with PharmD and provider.   Interventions: 1:1 collaboration with Caren Macadam, MD regarding development and update of comprehensive plan of care as evidenced by provider attestation and co-signature Inter-disciplinary care team collaboration (see  longitudinal plan of care) Comprehensive medication review performed; medication list updated in electronic medical record  Hyperlipidemia: (LDL goal < 100) -Uncontrolled -Current treatment: Fish oil 1000 mg daily -Medications previously tried: none  -Current dietary patterns: following ketogenic diet -Current exercise habits: not active right now -Educated on Cholesterol goals;  Importance of limiting foods high in cholesterol; Exercise goal of 150 minutes per week; -Recommended CAC scoring and consider statin therapy.  COPD/Asthma (Goal: control symptoms and prevent exacerbations) -Controlled -Current treatment  Symbicort 160-4.5 mcg/act inhale 2 puffs twice daily Montelukast 10 mg 1 tablet daily Albuterol nebulizer as needed (not using often) Albuterol HFA as needed (not using often) -Medications previously tried: Librarian, academic (made her worse)  -Gold Grade:  -Gold Grade: Gold 1 (FEV1>80%) -Current COPD Classification:  A (low sx, <2 exacerbations/yr) -MMRC/CAT score: n/a -Pulmonary function testing: 10/25/20 -Exacerbations requiring treatment in last 6 months: none -Patient reports consistent use of maintenance inhaler -Frequency of rescue inhaler use: uses more August and September (couple times a week) -Counseled on Proper inhaler technique; Benefits of consistent maintenance inhaler use When to use rescue inhaler Differences between maintenance and rescue inhalers -Counseled on diet and exercise extensively Recommended to continue current medication  Anxiety/PTSD/Bipolar disorder (Goal: minimize symptoms) -Not ideally controlled -Current  treatment: Buspirone 15 mg 1 tablet three times daily Doxazosin 4 mg 2 tablets at bedtime - taking 1 tablet at bedtime Mirtazapine 30 mg 1 tablet at bedtime Diazepam 5 mg 1 tablet every 6 hours as needed for anxiety -Medications previously tried/failed: Xanax (disassociative stages), sertraline, Prozac (side effects), Zoloft (side  effects), Lexapro (side effects) -PHQ9: 11 -GAD7: n/a -Educated on Benefits of medication for symptom control Benefits of cognitive-behavioral therapy with or without medication -Counseled on risk for benzodiazepines (diazepam, clonazepam, alprazolam, etc.) for long term use including worsening of PTSD. Recommended genesight testing.  Osteopenia (Goal prevent fractures) -Not ideally controlled -Last DEXA Scan: 02/13/19   T-Score femoral neck: -1.7  T-Score total hip: n/a  T-Score lumbar spine: -0.9  T-Score forearm radius: n/a  10-year probability of major osteoporotic fracture: 14.9%  10-year probability of hip fracture: 1.7% -Patient is not a candidate for pharmacologic treatment -Current treatment  Multivitamin 1 tablet daily (220 mg of calcium, 1000 units of vitamin D) Calcium, mag & vitamin D (500 mg calcium, 1000 units of vitamin D) 1 tablet daily Vitamin D 4000 units daily (4 x 1000 unit capsules) -Medications previously tried: none  -Recommend [phone] units of vitamin D daily. Recommend 1200 mg of calcium daily from dietary and supplemental sources. Recommend weight-bearing and muscle strengthening exercises for building and maintaining bone density. Recommended increasing calcium, mag & vitamin D supplement to twice daily and stopping super C. -Recommended repeat DEXA. Recommended decreasing vitamin D to 3 capsules per day.  GERD (Goal: minimize symptoms) -Controlled -Current treatment  Pantoprazole 40 mg 1 tablet twice daily Famotidine 20 mg 1 tablet twice daily Cholestyramine 1 g 2 tablets twice daily Dicyclomine 20 mg as needed -Medications previously tried: n/a  -Counseled on non-pharmacologic management of symptoms such as elevating the head of your bed, avoiding eating 2-3 hours before bed, avoiding triggering foods such as acidic, spicy, or fatty foods, eating smaller meals, and wearing clothes that are loose around the waist  Allergic rhinitis (Goal: minimize  symptoms) -Not ideally controlled -Current treatment  Fluticasone 50 macg/act as needed Ipratropium - not started Cetirizine 10 mg 1 tablet daily -Medications previously tried: atrovent  -Counseled on risk for montelukast worsening anxiety. Recommended trial and error for figuring out which medications are helping.   Health Maintenance -Vaccine gaps: none -Current therapy:  Biotin daily Vitamin B complex daily Vitamin C daily Collagen daily Ferrous sulfate 325 mg 1 tablet daily Multivitamin 1 tablet daily Magnesium daily Zinc 50 mg 1 tablet daily -Educated on Herbal supplement research is limited and benefits usually cannot be proven Cost vs benefit of each product must be carefully weighed by individual consumer Supplements may interfere with prescription drugs -Patient is satisfied with current therapy and denies issues -Recommended to continue current medication  Patient Goals/Self-Care Activities Patient will:  - take medications as prescribed as evidenced by patient report and record review target a minimum of 150 minutes of moderate intensity exercise weekly  Follow Up Plan: The care management team will reach out to the patient again over the next 30 days.         Medication Assistance:  Symbicort obtained through [place] medication [place].  Enrollment ends 06/17/21  Compliance/Adherence/Medication fill history: Care Gaps: None  Star-Rating Drugs: None  Patient's preferred pharmacy is:  [facility] [id number] - [place], [place] [address] [place] [address] Phone: [phone] Fax: [phone]  Uses pill box? Yes Pt endorses 99% compliance  We discussed: Current pharmacy is preferred with insurance plan and patient is satisfied with pharmacy services Patient decided to: Continue current medication management strategy  Care Plan and Follow Up Patient Decision:  Patient agrees to Care Plan and  Follow-up.  Plan: The care management team will reach out to the patient again over the next 30 days.  Jeni Salles, PharmD, [place] Pharmacist [facility]

## 2021-05-22 LAB — METHYLMALONIC ACID, SERUM: Methylmalonic Acid, Quant: 171 nmol/L (ref 87–318)

## 2021-05-22 LAB — HOMOCYSTEINE: Homocysteine: 9.6 umol/L (ref <10.4)

## 2021-05-22 LAB — ZINC: Zinc: 74 ug/dL (ref 60–130)

## 2021-05-22 LAB — MAGNESIUM, RBC

## 2021-05-23 NOTE — Addendum Note (Signed)
Addended by: Rosalyn Gess D on: 05/23/2021 09:11 AM   Modules accepted: Orders

## 2021-05-24 ENCOUNTER — Other Ambulatory Visit: Payer: PPO

## 2021-05-24 DIAGNOSIS — R252 Cramp and spasm: Secondary | ICD-10-CM | POA: Diagnosis not present

## 2021-05-29 LAB — MAGNESIUM, RBC: Magnesium RBC: 5.2 mg/dL (ref 4.0–6.4)

## 2021-06-01 NOTE — Patient Instructions (Addendum)
Hi Camora,  It was great to get to meet you in person! Below is a summary of some of the topics we discussed.   For the supplements, we agreed to decrease your vitamin D alone to only 3 capsules per day and stop the super C supplement. Also, increase your calcium, magnesium & vitamin D supplement to 1 tablet twice daily so you can get the recommended amounts of calcium and vitamin D per day.  Also, it looks like your colestipol is going to be $45 per month according to your formulary for next year and there aren't any cheaper alternatives.   Please reach out to me if you have any questions or need anything before our follow up!  Best, Maddie  Jeni Salles, PharmD, [facility]   Visit Information   Goals Addressed   None    Patient Care Plan: CCM Pharmacy Care Plan     Problem Identified: Problem: Hyperlipidemia, GERD, COPD, Asthma, Osteopenia, and PTSD, Bipolar disorder      Long-Range Goal: Patient-Specific Goal   Start Date: 05/19/2021  Expected End Date: 05/19/2022  This Visit's Progress: On track  Priority: High  Note:   Current Barriers:  Unable to independently afford treatment regimen Unable to independently monitor therapeutic efficacy  Pharmacist Clinical Goal(s):  Patient will verbalize ability to afford treatment regimen achieve adherence to monitoring guidelines and medication adherence to achieve therapeutic efficacy through collaboration with PharmD and provider.   Interventions: 1:1 collaboration with Caren Macadam, MD regarding development and update of comprehensive plan of care as evidenced by provider attestation and co-signature Inter-disciplinary care team collaboration (see longitudinal plan of care) Comprehensive medication review performed; medication list updated in electronic medical record  Hyperlipidemia: (LDL goal < 100) -Uncontrolled -Current treatment: Fish oil 1000 mg  daily -Medications previously tried: none  -Current dietary patterns: following ketogenic diet -Current exercise habits: not active right now -Educated on Cholesterol goals;  Importance of limiting foods high in cholesterol; Exercise goal of 150 minutes per week; -Recommended CAC scoring and consider statin therapy.  COPD/Asthma (Goal: control symptoms and prevent exacerbations) -Controlled -Current treatment  Symbicort 160-4.5 mcg/act inhale 2 puffs twice daily Montelukast 10 mg 1 tablet daily Albuterol nebulizer as needed (not using often) Albuterol HFA as needed (not using often) -Medications previously tried: Librarian, academic (made her worse)  -Gold Grade:  -Gold Grade: Gold 1 (FEV1>80%) -Current COPD Classification:  A (low sx, <2 exacerbations/yr) -MMRC/CAT score: n/a -Pulmonary function testing: 10/25/20 -Exacerbations requiring treatment in last 6 months: none -Patient reports consistent use of maintenance inhaler -Frequency of rescue inhaler use: uses more August and September (couple times a week) -Counseled on Proper inhaler technique; Benefits of consistent maintenance inhaler use When to use rescue inhaler Differences between maintenance and rescue inhalers -Counseled on diet and exercise extensively Recommended to continue current medication  Anxiety/PTSD/Bipolar disorder (Goal: minimize symptoms) -Not ideally controlled -Current treatment: Buspirone 15 mg 1 tablet three times daily Doxazosin 4 mg 2 tablets at bedtime - taking 1 tablet at bedtime Mirtazapine 30 mg 1 tablet at bedtime Diazepam 5 mg 1 tablet every 6 hours as needed for anxiety -Medications previously tried/failed: Xanax (disassociative stages), sertraline, Prozac (side effects), Zoloft (side effects), Lexapro (side effects) -PHQ9: 11 -GAD7: n/a -Educated on Benefits of medication for symptom control Benefits of cognitive-behavioral therapy with or without medication -Counseled on risk for benzodiazepines  (diazepam, clonazepam, alprazolam, etc.) for long term use including worsening of PTSD. Recommended genesight testing.  Osteopenia (Goal prevent fractures) -Not ideally controlled -Last DEXA Scan: 02/13/19   T-Score femoral neck: -1.7  T-Score total hip: n/a  T-Score lumbar spine: -0.9  T-Score forearm radius: n/a  10-year probability of major osteoporotic fracture: 14.9%  10-year probability of hip fracture: 1.7% -Patient is not a candidate for pharmacologic treatment -Current treatment  Multivitamin 1 tablet daily (220 mg of calcium, 1000 units of vitamin D) Calcium, mag & vitamin D (500 mg calcium, 1000 units of vitamin D) 1 tablet daily Vitamin D 4000 units daily (4 x 1000 unit capsules) -Medications previously tried: none  -Recommend [phone] units of vitamin D daily. Recommend 1200 mg of calcium daily from dietary and supplemental sources. Recommend weight-bearing and muscle strengthening exercises for building and maintaining bone density. Recommended increasing calcium, mag & vitamin D supplement to twice daily and stopping super C. -Recommended repeat DEXA. Recommended decreasing vitamin D to 3 capsules per day.  GERD (Goal: minimize symptoms) -Controlled -Current treatment  Pantoprazole 40 mg 1 tablet twice daily Famotidine 20 mg 1 tablet twice daily Cholestyramine 1 g 2 tablets twice daily Dicyclomine 20 mg as needed -Medications previously tried: n/a  -Counseled on non-pharmacologic management of symptoms such as elevating the head of your bed, avoiding eating 2-3 hours before bed, avoiding triggering foods such as acidic, spicy, or fatty foods, eating smaller meals, and wearing clothes that are loose around the waist  Allergic rhinitis (Goal: minimize symptoms) -Not ideally controlled -Current treatment  Fluticasone 50 macg/act as needed Ipratropium - not started Cetirizine 10 mg 1 tablet daily -Medications previously tried: atrovent  -Counseled on risk for  montelukast worsening anxiety. Recommended trial and error for figuring out which medications are helping.   Health Maintenance -Vaccine gaps: none -Current therapy:  Biotin daily Vitamin B complex daily Vitamin C daily Collagen daily Ferrous sulfate 325 mg 1 tablet daily Multivitamin 1 tablet daily Magnesium daily Zinc 50 mg 1 tablet daily -Educated on Herbal supplement research is limited and benefits usually cannot be proven Cost vs benefit of each product must be carefully weighed by individual consumer Supplements may interfere with prescription drugs -Patient is satisfied with current therapy and denies issues -Recommended to continue current medication  Patient Goals/Self-Care Activities Patient will:  - take medications as prescribed as evidenced by patient report and record review target a minimum of 150 minutes of moderate intensity exercise weekly  Follow Up Plan: The care management team will reach out to the patient again over the next 30 days.       Ms. Theissen was given information about Chronic Care Management services today including:  CCM service includes personalized support from designated clinical staff supervised by her physician, including individualized plan of care and coordination with other care providers 24/7 contact phone numbers for assistance for urgent and routine care needs. Standard insurance, coinsurance, copays and deductibles apply for chronic care management only during months in which we provide at least 20 minutes of these services. Most insurances cover these services at 100%, however patients may be responsible for any copay, coinsurance and/or deductible if applicable. This service may help you avoid the need for more expensive face-to-face services. Only one practitioner may furnish and bill the service in a calendar month. The patient may stop CCM services at any time (effective at the end of the month) by phone call to the office  staff.  Patient agreed to services and verbal consent obtained.   Patient verbalizes understanding of instructions provided today and agrees to  view in [place].  The pharmacy team will reach out to the patient again over the next 30 days.   Viona Gilmore, [facility]

## 2021-06-06 ENCOUNTER — Telehealth: Payer: Self-pay | Admitting: Pulmonary Disease

## 2021-06-06 NOTE — Telephone Encounter (Signed)
Call made to patient, confirmed DOB. Patient states she was seen recently seen and a refill of her symbicort needed to be sent in. I inquired as to whether she has reached out to [facility]. Patient states she thought that was our job. I made her aware I do see where the script was printed, signed and faxed as a copy is in the providers. Made patient aware we will re-fax the script.   Voiced understanding.   Nothing further needed at this time.

## 2021-06-14 ENCOUNTER — Telehealth (INDEPENDENT_AMBULATORY_CARE_PROVIDER_SITE_OTHER): Payer: PPO | Admitting: Family Medicine

## 2021-06-14 ENCOUNTER — Encounter: Payer: Self-pay | Admitting: Family Medicine

## 2021-06-14 DIAGNOSIS — F3130 Bipolar disorder, current episode depressed, mild or moderate severity, unspecified: Secondary | ICD-10-CM | POA: Diagnosis not present

## 2021-06-14 DIAGNOSIS — F4312 Post-traumatic stress disorder, chronic: Secondary | ICD-10-CM

## 2021-06-14 DIAGNOSIS — Z8241 Family history of sudden cardiac death: Secondary | ICD-10-CM | POA: Diagnosis not present

## 2021-06-14 DIAGNOSIS — E785 Hyperlipidemia, unspecified: Secondary | ICD-10-CM

## 2021-06-14 NOTE — Progress Notes (Signed)
Virtual Visit via Video Note  I connected with Brittany [name]  on 06/14/21 at  1:00 PM EST by a video enabled telemedicine application and verified that I am speaking with the correct person using two identifiers.  Location patient: home Location provider: [place], [place] [address] Persons participating in the virtual visit: patient, provider  I discussed the limitations of evaluation and management by telemedicine and the availability of in person appointments. The patient expressed understanding and agreed to proceed.   Brittany [name] DOB: [birth year] Encounter date: 06/14/2021  This is a 65 y.o. female who presents with Chief Complaint  Patient presents with   Results    History of present illness:  Worried about lab results from 11/23. Wanted to talk about the abnormalities.   This is hard time of year for her with losing daughter; seasonal depression. Has also lost pets in recent time.    Allergies  Allergen Reactions   Aspirin Other (See Comments)    discomfort   Codeine Itching   Niacin And Related Itching   Sulfamethoxazole-Trimethoprim Other (See Comments)    Unknown reaction per pt   Topiramate Other (See Comments)    Per patient, it causes stomach burning   Trileptal [Oxcarbazepine]     itching   Trimethoprim Other (See Comments)    Unknown, per pt   Ultram [Tramadol] Itching   Vioxx [Rofecoxib]     Unknown per pt   Current Meds  Medication Sig   albuterol (PROVENTIL) (2.5 MG/3ML) 0.083% nebulizer solution Take 3 mLs (2.5 mg total) by nebulization every 6 (six) hours as needed for wheezing or shortness of breath.   Albuterol Sulfate (PROAIR RESPICLICK) 315 (90 Base) MCG/ACT AEPB Inhale 2 puffs into the lungs every 6 (six) hours as needed.   Ascorbic Acid (VITAMIN C PO) Take by mouth. Super C with vitamin D   b complex vitamins capsule Take 1 capsule by mouth daily.   BIOTIN PO Take 5,000 mcg by mouth  daily.   budesonide-formoterol (SYMBICORT) 160-4.5 MCG/ACT inhaler Inhale 2 puffs into the lungs 2 (two) times daily.   busPIRone (BUSPAR) 15 MG tablet Take 1 tablet (15 mg total) by mouth 3 (three) times daily.   cetirizine (ZYRTEC) 10 MG tablet Take 1 tablet (10 mg total) by mouth daily.   Cholecalciferol (VITAMIN D-3 PO) Take 10,000 Units by mouth daily.    colestipol (COLESTID) 1 g tablet Take 2 tablets (2 g total) by mouth 2 (two) times daily.   COLLAGEN PO Take by mouth.   diazepam (VALIUM) 5 MG tablet Take 1 tablet (5 mg total) by mouth every 6 (six) hours as needed. for anxiety   dicyclomine (BENTYL) 20 MG tablet TAKE 1 TABLET BY MOUTH FOUR TIMES A DAY AS NEEDED FOR SPASMS   doxazosin (CARDURA) 4 MG tablet Take 2 tablets (8 mg total) by mouth at bedtime. (Patient taking differently: Take 4 mg by mouth at bedtime.)   famotidine (PEPCID) 20 MG tablet TAKE ONE TABLET BY MOUTH TWICE A DAY   ferrous sulfate 325 (65 FE) MG EC tablet Take 325 mg by mouth 2 (two) times a week.   fluticasone (FLONASE) 50 MCG/ACT nasal spray SPRAY TWO SPRAYS IN EACH NOSTRIL TWICE DAILY   ipratropium (ATROVENT) 0.03 % nasal spray Place 2 sprays into both nostrils every 12 (twelve) hours.   MAGNESIUM PO Calcium citrate, magnesium and vitamin D3 combo   mirtazapine (REMERON) 30 MG tablet Take  1 tablet (30 mg total) by mouth at bedtime.   montelukast (SINGULAIR) 10 MG tablet Take 1 tablet (10 mg total) by mouth at bedtime.   Multiple Vitamin (MULTIVITAMIN WITH MINERALS) TABS tablet Take 1 tablet by mouth daily.   Omega-3 Fatty Acids (FISH OIL PO) Take 1,000 mg by mouth daily.   pantoprazole (PROTONIX) 40 MG tablet Take 1 tablet (40 mg total) by mouth 2 (two) times daily.   sodium chloride (OCEAN) 0.65 % SOLN nasal spray Place 1 spray into both nostrils as needed for congestion.   TURMERIC CURCUMIN PO Take by mouth.   Zinc 50 MG TABS    Current Facility-Administered Medications for the 06/14/21 encounter (Video  Visit) with Caren Macadam, MD  Medication   0.9 %  sodium chloride infusion   0.9 %  sodium chloride infusion    Review of Systems  Constitutional:  Negative for chills, fatigue and fever.  Respiratory:  Negative for cough, chest tightness, shortness of breath and wheezing.   Cardiovascular:  Negative for chest pain, palpitations and leg swelling.  Psychiatric/Behavioral:         Mood is depressed    Objective:  There were no vitals taken for this visit.      BP Readings from Last 3 Encounters:  05/18/21 118/72  05/10/21 110/78  04/11/21 129/72   Wt Readings from Last 3 Encounters:  05/18/21 175 lb 9.6 oz (79.7 kg)  05/10/21 173 lb (78.5 kg)  01/25/21 169 lb (76.7 kg)    EXAM:  GENERAL: alert, oriented, appears well and in no acute distress  HEENT: atraumatic, conjunctiva clear, no obvious abnormalities on inspection of external nose and ears  NECK: normal movements of the head and neck  LUNGS: on inspection no signs of respiratory distress, breathing rate appears normal, no obvious gross SOB, gasping or wheezing  CV: no obvious cyanosis  MS: moves all visible extremities without noticeable abnormality  PSYCH/NEURO: mood is depressed. She has difficult time in winter season and it is anniversary of daughter's death, which makes things more difficult. She does follow regularly with Dr. Clovis Pu, but still has hard time with her depression.   Assessment/Plan 1. Bipolar affective disorder, current episode depressed, current episode severity unspecified ([place]) After discussing gene sight testing with pharmacist and with me today, she is interested in completing this. We can get her results to discuss with psychiatry as well since she has had difficulty with mood control/med tolerance/benefit in past. She can call gene sight for pricing before submitting sample.   2. Chronic post-traumatic stress disorder (PTSD) See above.   3. Hyperlipidemia, unspecified  hyperlipidemia type She is interested in preventative health, but does prefer to limit medications when possible. Discussed ascvd scoring, discussed coronary ct and how results from this as well as lipids can help guide preventative treatment options.  - CT CARDIAC SCORING (SELF PAY ONLY); Future  4. Family history of sudden cardiac death See above.  - CT CARDIAC SCORING (SELF PAY ONLY); Future    We reviewed all labwork together. She has made changes as well suggested at last visit with maddie.    I discussed the assessment and treatment plan with the patient. The patient was provided an opportunity to ask questions and all were answered. The patient agreed with the plan and demonstrated an understanding of the instructions.   The patient was advised to call back or seek an in-person evaluation if the symptoms worsen or if the condition fails to improve as  anticipated.  I provided 30 minutes of face-to-face time during this encounter.   Micheline Rough, MD

## 2021-06-17 DIAGNOSIS — E785 Hyperlipidemia, unspecified: Secondary | ICD-10-CM

## 2021-06-21 ENCOUNTER — Encounter: Payer: Self-pay | Admitting: Family Medicine

## 2021-06-26 NOTE — Telephone Encounter (Signed)
I spoke with Randall Hiss from [facility] and he said they will handle the PA completely. If there is ever one that is required, we don't have to do anything for it and they take care of it all.

## 2021-06-27 ENCOUNTER — Other Ambulatory Visit: Payer: Self-pay | Admitting: Psychiatry

## 2021-06-27 DIAGNOSIS — F4001 Agoraphobia with panic disorder: Secondary | ICD-10-CM

## 2021-07-09 ENCOUNTER — Other Ambulatory Visit: Payer: Self-pay | Admitting: Pulmonary Disease

## 2021-07-13 DIAGNOSIS — M5451 Vertebrogenic low back pain: Secondary | ICD-10-CM | POA: Diagnosis not present

## 2021-07-13 DIAGNOSIS — M5459 Other low back pain: Secondary | ICD-10-CM | POA: Diagnosis not present

## 2021-07-17 ENCOUNTER — Other Ambulatory Visit: Payer: Self-pay | Admitting: Psychiatry

## 2021-07-17 DIAGNOSIS — F515 Nightmare disorder: Secondary | ICD-10-CM

## 2021-07-18 NOTE — Telephone Encounter (Signed)
Last filled 10/31 appt on 4/12

## 2021-07-19 DIAGNOSIS — H16223 Keratoconjunctivitis sicca, not specified as Sjogren's, bilateral: Secondary | ICD-10-CM | POA: Diagnosis not present

## 2021-07-21 ENCOUNTER — Other Ambulatory Visit: Payer: Self-pay

## 2021-07-21 ENCOUNTER — Ambulatory Visit (INDEPENDENT_AMBULATORY_CARE_PROVIDER_SITE_OTHER)
Admission: RE | Admit: 2021-07-21 | Discharge: 2021-07-21 | Disposition: A | Discharge status: Home / Self Care | Payer: Self-pay | Source: Ambulatory Visit | Attending: Family Medicine | Admitting: Family Medicine

## 2021-07-21 DIAGNOSIS — Z8241 Family history of sudden cardiac death: Secondary | ICD-10-CM

## 2021-07-21 DIAGNOSIS — E785 Hyperlipidemia, unspecified: Secondary | ICD-10-CM

## 2021-07-23 DIAGNOSIS — M5451 Vertebrogenic low back pain: Secondary | ICD-10-CM | POA: Diagnosis not present

## 2021-07-31 ENCOUNTER — Other Ambulatory Visit: Payer: Self-pay | Admitting: Family Medicine

## 2021-07-31 MED ORDER — ROSUVASTATIN CALCIUM 10 MG PO TABS: 10.0000 mg | ORAL_TABLET | Freq: Every day | ORAL | 5 refills | Status: DC

## 2021-08-02 DIAGNOSIS — M5451 Vertebrogenic low back pain: Secondary | ICD-10-CM | POA: Diagnosis not present

## 2021-08-07 ENCOUNTER — Encounter: Payer: Self-pay | Admitting: Family Medicine

## 2021-08-08 NOTE — Telephone Encounter (Signed)
So I reached out to him and he said somehow the order was canceled. He cannot tell if the patient canceled it or if it was on our end. Can you reorder it?

## 2021-08-13 ENCOUNTER — Encounter: Payer: Self-pay | Admitting: Family Medicine

## 2021-08-23 DIAGNOSIS — M5416 Radiculopathy, lumbar region: Secondary | ICD-10-CM | POA: Diagnosis not present

## 2021-08-26 ENCOUNTER — Other Ambulatory Visit: Payer: Self-pay | Admitting: Pulmonary Disease

## 2021-08-28 ENCOUNTER — Telehealth: Payer: Self-pay | Admitting: Pulmonary Disease

## 2021-08-28 DIAGNOSIS — R1084 Generalized abdominal pain: Secondary | ICD-10-CM

## 2021-08-28 DIAGNOSIS — K219 Gastro-esophageal reflux disease without esophagitis: Secondary | ICD-10-CM

## 2021-08-28 DIAGNOSIS — R140 Abdominal distension (gaseous): Secondary | ICD-10-CM

## 2021-08-28 DIAGNOSIS — R197 Diarrhea, unspecified: Secondary | ICD-10-CM

## 2021-08-29 MED ORDER — FAMOTIDINE 20 MG PO TABS: 20.0000 mg | ORAL_TABLET | Freq: Two times a day (BID) | ORAL | 3 refills | Status: DC

## 2021-08-29 NOTE — Telephone Encounter (Signed)
Lm x1 for patient.  

## 2021-08-29 NOTE — Telephone Encounter (Signed)
Pepcid sent to preferred pharmacy.  ?Patient is aware and voiced her understanding.  ?Nothing further needed.  ? ?

## 2021-08-30 ENCOUNTER — Encounter: Payer: Self-pay | Admitting: Gastroenterology

## 2021-09-01 ENCOUNTER — Ambulatory Visit: Payer: PPO | Admitting: Physician Assistant

## 2021-09-03 NOTE — Progress Notes (Signed)
?Cardiology Office Note:   ? ?Date:  09/04/2021  ? ?ID:  Brittany [name], DOB 14-May-1956, MRN [id number] ? ?PCP:  Caren Macadam, MD ?  ?[facility] ?Cardiologist:  Jenkins Rouge, MD    ? ?Referring MD: Caren Macadam, MD  ? ?Chief Complaint: follow-up elevated calcium score ? ?History of Present Illness:   ? ?Brittany [name] is a 66 y.o. female with a hx of COPD, RBBB, family history CAD, asthma, anxiety, panic attacks, PTSD,  ? ?She established care with our group in 01/2015 and was seen by Dr. Johnsie Cancel for abnormal EKG which revealed RBBB/LAD/LAFB with no prior tracing for comparison. She had non ischemic myovue 02/11/15 with EF 74%.  ? ?She was last seen in our office 01/08/20 for dyspnea. Symptoms were felt to be 2/2 recent COVID-19 infection and she was advised to follow-up with cardiology as needed. ? ?She had a coronary calcium score of 215, 89th percentile for age/sex matched controls. She had hyperlipidemia with LDL 158 in November 2022. ? ?Today, she is here for evaluation due to concern over elevated coronary calcium score.  Strong family history CAD. Has had elevated LDL cholesterol for many years but reports PCP told her it was mildly elevated. She is concerned about recent weight gain despite improvements in diet. She denies chest pain, fatigue, palpitations, melena, hematuria, hemoptysis, diaphoresis, weakness, presyncope, syncope, orthopnea, and PND. Feels like heart is flip-flopping when she lies on her left side, resolves with position change. Has mild bilateral lower extremity edema. Cannot elevate legs when sitting due to issues with her back.  ?Tried rosuvastatin for elevated cholesterol in the past, felt like she was walking in cement. Reports history of asthma and often feels short of breath. Has joined [employer] and is working on [employer]. Previously had good results with keto diet. Is walking at least 7000 steps daily. Reports extensive lab panel done  by PCP due to her insistence with no obvious deficiencies.  ? ?Past Medical History:  ?Diagnosis Date  ? Abnormal EKG   ? Allergy   ? Anxiety   ? Asthma   ? Bipolar depression ([place]), Anxiety, PTSD, Panic disorder   ? -managed by [facility]  ? Depression   ? Foot pain   ? GERD (gastroesophageal reflux disease)   ? Heart murmur   ? as a child  ? Insomnia   ? Leg swelling   ? Osteoporosis   ? osteopenia  ? Pneumonia due to COVID-19 virus 04/22/2019  ? Tachycardia   ? ? ?Past Surgical History:  ?Procedure Laterality Date  ? BREAST BIOPSY Right   ? biopsy of nipple  ? CARPAL TUNNEL RELEASE Left   ? CESAREAN SECTION    ? x 2  ? CHOLECYSTECTOMY    ? COLONOSCOPY    ? ENDOMETRIAL ABLATION    ? KNEE SURGERY Right   ? reconstruction for patellar dislocation  ? LAPAROSCOPY    ? x 2  ? TUBAL LIGATION    ? ULNAR NERVE REPAIR Left   ? UPPER GASTROINTESTINAL ENDOSCOPY    ? ? ?Current Medications: ?Current Meds  ?Medication Sig  ? albuterol (PROVENTIL) (2.5 MG/3ML) 0.083% nebulizer solution Take 3 mLs (2.5 mg total) by nebulization every 6 (six) hours as needed for wheezing or shortness of breath.  ? Albuterol Sulfate (PROAIR RESPICLICK) 269 (90 Base) MCG/ACT AEPB Inhale 2 puffs into the lungs every 6 (six) hours as needed.  ? atorvastatin (LIPITOR) 20 MG tablet Take  1 tablet by mouth daily, If tolerated increase to 40 mg daily on Monday, April 10th  ? Azelastine HCl 137 MCG/SPRAY SOLN PLACE 1 SPRAY IN BOTH NOSTRILS TWO TIMES A DAY AS DIRECTED  ? b complex vitamins capsule Take 1 capsule by mouth daily.  ? BIOTIN PO Take 5,000 mcg by mouth daily.  ? budesonide-formoterol (SYMBICORT) 160-4.5 MCG/ACT inhaler Inhale 2 puffs into the lungs 2 (two) times daily.  ? busPIRone (BUSPAR) 15 MG tablet Take 1 tablet (15 mg total) by mouth 3 (three) times daily.  ? cetirizine (ZYRTEC) 10 MG tablet Take 1 tablet (10 mg total) by mouth daily.  ? Cholecalciferol (VITAMIN D-3 PO) Take 10,000 Units by mouth daily.   ? colestipol (COLESTID)  1 g tablet Take 2 tablets (2 g total) by mouth 2 (two) times daily.  ? COLLAGEN PO Take by mouth.  ? diazepam (VALIUM) 5 MG tablet TAKE ONE TABLET BY MOUTH EVERY 6 HOURS AS NEEDED FOR ANXIETY  ? dicyclomine (BENTYL) 20 MG tablet TAKE 1 TABLET BY MOUTH FOUR TIMES A DAY AS NEEDED FOR SPASMS  ? doxazosin (CARDURA) 4 MG tablet TAKE TWO TABLETS BY MOUTH EVERY NIGHT AT BEDTIME  ? famotidine (PEPCID) 20 MG tablet Take 1 tablet (20 mg total) by mouth 2 (two) times daily.  ? ferrous sulfate 325 (65 FE) MG EC tablet Take 325 mg by mouth 2 (two) times a week.  ? fluticasone (FLONASE) 50 MCG/ACT nasal spray SPRAY TWO SPRAYS IN EACH NOSTRIL TWICE DAILY  ? HYDROcodone-acetaminophen (NORCO/VICODIN) 5-325 MG tablet Take 1 tablet by mouth every 6 (six) hours as needed.  ? MAGNESIUM PO Calcium citrate, magnesium and vitamin D3 combo  ? mirtazapine (REMERON) 30 MG tablet Take 1 tablet (30 mg total) by mouth at bedtime.  ? montelukast (SINGULAIR) 10 MG tablet TAKE ONE TABLET BY MOUTH EVERY NIGHT AT BEDTIME  ? Multiple Vitamin (MULTIVITAMIN WITH MINERALS) TABS tablet Take 1 tablet by mouth daily.  ? pantoprazole (PROTONIX) 40 MG tablet Take 1 tablet (40 mg total) by mouth 2 (two) times daily.  ? sodium chloride (OCEAN) 0.65 % SOLN nasal spray Place 1 spray into both nostrils as needed for congestion.  ? ?Current Facility-Administered Medications for the 09/04/21 encounter (Office Visit) with Ann Maki, Lanice Schwab, NP  ?Medication  ? 0.9 %  sodium chloride infusion  ? 0.9 %  sodium chloride infusion  ?  ? ?Allergies:   Aspirin, Codeine, Crestor [rosuvastatin], Gabapentin, Niacin and related, Sulfa antibiotics, Sulfamethoxazole-trimethoprim, Topiramate, Trileptal [oxcarbazepine], Trimethoprim, Ultram [tramadol], and Vioxx [rofecoxib]  ? ?Social History  ? ?Socioeconomic History  ? Marital status: Single  ?  Spouse name: Not on file  ? Number of children: 3  ? Years of education: Not on file  ? Highest education level: Not on file   ?Occupational History  ? Occupation: disabled  ?Tobacco Use  ? Smoking status: Former  ?  Packs/day: 20.00  ?  Years: 1.00  ?  Pack years: 20.00  ?  Types: Cigarettes  ?  Quit date: 06/19/1999  ?  Years since quitting: 22.2  ? Smokeless tobacco: Never  ? Tobacco comments:  ?  20 year estimate but probably less   ?Vaping Use  ? Vaping Use: Never used  ?Substance and Sexual Activity  ? Alcohol use: Yes  ?  Alcohol/week: 0.0 standard drinks  ?  Comment: occ  ? Drug use: No  ? Sexual activity: Never  ?Other Topics Concern  ? Not on file  ?Social  History Narrative  ? Work or School: Disabled seconndary to psychiatric conditions  ?   ? Home Situation: lives alone with 2 cats and one dog  ?   ? Spiritual Beliefs: Christian  ?   ? Lifestyle: no regular exercise, diet  Not great - wants to embark on healthier lifestyle  ? ?Social Determinants of Health  ? ?Financial Resource Strain: Low Risk   ? Difficulty of Paying Living Expenses: Not hard at all  ?Food Insecurity: No Food Insecurity  ? Worried About Charity fundraiser in the Last Year: Never true  ? Ran Out of Food in the Last Year: Never true  ?Transportation Needs: No Transportation Needs  ? Lack of Transportation (Medical): No  ? Lack of Transportation (Non-Medical): No  ?Physical Activity: Inactive  ? Days of Exercise per Week: 0 days  ? Minutes of Exercise per Session: 0 min  ?Stress: Stress Concern Present  ? Feeling of Stress : To some extent  ?Social Connections: Moderately Isolated  ? Frequency of Communication with Friends and Family: More than three times a week  ? Frequency of Social Gatherings with Friends and Family: More than three times a week  ? Attends Religious Services: 1 to 4 times per year  ? Active Member of Clubs or Organizations: No  ? Attends Archivist Meetings: Never  ? Marital Status: Never married  ?  ? ?Family History: ?The patient's family history includes COPD in her brother and paternal aunt; Colon polyps in her maternal aunt;  Congestive Heart Failure in her mother; Emphysema in her paternal uncle; Heart disease in her brother; Heart failure in her mother; Hyperlipidemia in her mother; Hypertension in her mother; Other in her father; P

## 2021-09-04 ENCOUNTER — Ambulatory Visit: Payer: PPO | Admitting: Nurse Practitioner

## 2021-09-04 ENCOUNTER — Other Ambulatory Visit: Payer: Self-pay

## 2021-09-04 ENCOUNTER — Encounter: Payer: Self-pay | Admitting: Nurse Practitioner

## 2021-09-04 VITALS — BP 124/70 | HR 83 | Ht 61.5 in | Wt 191.2 lb

## 2021-09-04 DIAGNOSIS — E669 Obesity, unspecified: Secondary | ICD-10-CM

## 2021-09-04 DIAGNOSIS — R0600 Dyspnea, unspecified: Secondary | ICD-10-CM

## 2021-09-04 DIAGNOSIS — I2510 Atherosclerotic heart disease of native coronary artery without angina pectoris: Secondary | ICD-10-CM

## 2021-09-04 DIAGNOSIS — R931 Abnormal findings on diagnostic imaging of heart and coronary circulation: Secondary | ICD-10-CM

## 2021-09-04 DIAGNOSIS — I4510 Unspecified right bundle-branch block: Secondary | ICD-10-CM | POA: Diagnosis not present

## 2021-09-04 MED ORDER — ATORVASTATIN CALCIUM 20 MG PO TABS: ORAL_TABLET | ORAL | 3 refills | Status: DC

## 2021-09-04 NOTE — Patient Instructions (Addendum)
Medication Instructions:  ?Start Atorvastatin 20 mg daily, If tolerated increase to 40 mg daily on Monday, April 10 ? ?*If you need a refill on your cardiac medications before your next appointment, please call your pharmacy* ? ? ?Lab Work: ?Your physician recommends that you return for a FASTING lipid profile and hepatic function test on 11/17/21 ? ?If you have labs (blood work) drawn today and your tests are completely normal, you will receive your results only by: ?MyChart Message (if you have MyChart) OR ?A paper copy in the mail ?If you have any lab test that is abnormal or we need to change your treatment, we will call you to review the results. ? ? ?Testing/Procedures: ?None ordered  ? ? ?Follow-Up: ?Follow up as scheduled }  ? ? ?Other Instructions ? ?Exercising to Stay Healthy ?To become healthy and stay healthy, it is recommended that you do moderate-intensity and vigorous-intensity exercise. You can tell that you are exercising at a moderate intensity if your heart starts beating faster and you start breathing faster but can still hold a conversation. You can tell that you are exercising at a vigorous intensity if you are breathing much harder and faster and cannot hold a conversation while exercising. ?How can exercise benefit me? ?Exercising regularly is important. It has many health benefits, such as: ?Improving overall fitness, flexibility, and endurance. ?Increasing bone density. ?Helping with weight control. ?Decreasing body fat. ?Increasing muscle strength and endurance. ?Reducing stress and tension, anxiety, depression, or anger. ?Improving overall health. ?What guidelines should I follow while exercising? ?Before you start a new exercise program, talk with your health care provider. ?Do not exercise so much that you hurt yourself, feel dizzy, or get very short of breath. ?Wear comfortable clothes and wear shoes with good support. ?Drink plenty of water while you exercise to prevent dehydration or  heat stroke. ?Work out until your breathing and your heartbeat get faster (moderate intensity). ?How often should I exercise? ?Choose an activity that you enjoy, and set realistic goals. Your health care provider can help you make an activity plan that is individually designed and works best for you. ?Exercise regularly as told by your health care provider. This may include: ?Doing strength training two times a week, such as: ?Lifting weights. ?Using resistance bands. ?Push-ups. ?Sit-ups. ?Yoga. ?Doing a certain intensity of exercise for a given amount of time. Choose from these options: ?A total of 150 minutes of moderate-intensity exercise every week. ?A total of 75 minutes of vigorous-intensity exercise every week. ?A mix of moderate-intensity and vigorous-intensity exercise every week. ?Children, pregnant women, people who have not exercised regularly, people who are overweight, and older adults may need to talk with a health care provider about what activities are safe to perform. If you have a medical condition, be sure to talk with your health care provider before you start a new exercise program. ?What are some exercise ideas? ?Moderate-intensity exercise ideas include: ?Walking 1 mile (1.6 km) in about 15 minutes. ?Biking. ?Hiking. ?Golfing. ?Dancing. ?Water aerobics. ?Vigorous-intensity exercise ideas include: ?Walking 4.5 miles (7.2 km) or more in about 1 hour. ?Jogging or running 5 miles (8 km) in about 1 hour. ?Biking 10 miles (16.1 km) or more in about 1 hour. ?Lap swimming. ?Roller-skating or in-line skating. ?Cross-country skiing. ?Vigorous competitive sports, such as football, basketball, and soccer. ?Jumping rope. ?Aerobic dancing. ?What are some everyday activities that can help me get exercise? ?[place] work, such as: ?Pushing a Conservation officer, nature. ?Raking and bagging leaves. ?Washing  your car. ?Pushing a stroller. ?Shoveling snow. ?Gardening. ?Washing windows or floors. ?How can I be more active in my  day-to-day activities? ?Use stairs instead of an elevator. ?Take a walk during your lunch break. ?If you drive, park your car farther away from your work or school. ?If you take public transportation, get off one stop early and walk the rest of the way. ?Stand up or walk around during all of your indoor phone calls. ?Get up, stretch, and walk around every 30 minutes throughout the day. ?Enjoy exercise with a friend. Support to continue exercising will help you keep a regular routine of activity. ?Where to find more information ?You can find more information about exercising to stay healthy from: ?[place] Department of Health and Human Services: BondedCompany.at ?Centers for Disease Control and Prevention (CDC): http://www.wolf.info/ ?Summary ?Exercising regularly is important. It will improve your overall fitness, flexibility, and endurance. ?Regular exercise will also improve your overall health. It can help you control your weight, reduce stress, and improve your bone density. ?Do not exercise so much that you hurt yourself, feel dizzy, or get very short of breath. ?Before you start a new exercise program, talk with your health care provider. ?This information is not intended to replace advice given to you by your health care provider. Make sure you discuss any questions you have with your health care provider. ?Document Revised: 09/30/2020 Document Reviewed: 09/30/2020 ?Elsevier Patient Education ? [employer]. ? ?Exercise recommendations: ?Goal of exercising for at least 30 minutes a day, at least 5 times per week.  Please exercise to a moderate exertion.  This means that while exercising it is difficult to speak in full sentences, however you are not so short of breath that you feel you must stop, and not so comfortable that you can carry on a full conversation.  Exertion level should be approximately a 5/10, if 10 is the most exertion you can perform. ? ?Mediterranean Diet ?A Mediterranean diet refers to food and  lifestyle choices that are based on the traditions of countries located on the [employer]. It focuses on eating more fruits, vegetables, whole grains, beans, nuts, seeds, and heart-healthy fats, and eating less dairy, meat, eggs, and processed foods with added sugar, salt, and fat. This way of eating has been shown to help prevent certain conditions and improve outcomes for people who have chronic diseases, like kidney disease and heart disease. ?What are tips for following this plan? ?Reading food labels ?Check the serving size of packaged foods. For foods such as rice and pasta, the serving size refers to the amount of cooked product, not dry. ?Check the total fat in packaged foods. Avoid foods that have saturated fat or trans fats. ?Check the ingredient list for added sugars, such as corn syrup. ?Shopping ? ?Buy a variety of foods that offer a balanced diet, including: ?Fresh fruits and vegetables (produce). ?Grains, beans, nuts, and seeds. Some of these may be available in unpackaged forms or large amounts (in bulk). ?Fresh seafood. ?Poultry and eggs. ?Low-fat dairy products. ?Buy whole ingredients instead of prepackaged foods. ?Buy fresh fruits and vegetables in-season from local farmers markets. ?Buy plain frozen fruits and vegetables. ?If you do not have access to quality fresh seafood, buy precooked frozen shrimp or canned fish, such as tuna, salmon, or sardines. ?Stock your pantry so you always have certain foods on hand, such as olive oil, canned tuna, canned tomatoes, rice, pasta, and beans. ?Cooking ?Cook foods with extra-virgin olive oil instead of  using butter or other vegetable oils. ?Have meat as a side dish, and have vegetables or grains as your main dish. This means having meat in small portions or adding small amounts of meat to foods like pasta or stew. ?Use beans or vegetables instead of meat in common dishes like chili or lasagna. ?Experiment with different cooking methods. Try roasting,  broiling, steaming, and saut?ing vegetables. ?Add frozen vegetables to soups, stews, pasta, or rice. ?Add nuts or seeds for added healthy fats and plant protein at each meal. You can add these to yogurt, salads,

## 2021-09-05 ENCOUNTER — Other Ambulatory Visit: Payer: Self-pay

## 2021-09-05 DIAGNOSIS — R197 Diarrhea, unspecified: Secondary | ICD-10-CM

## 2021-09-05 MED ORDER — COLESTIPOL HCL 1 G PO TABS: 2.0000 g | ORAL_TABLET | Freq: Two times a day (BID) | ORAL | 2 refills | Status: DC

## 2021-09-06 DIAGNOSIS — M5416 Radiculopathy, lumbar region: Secondary | ICD-10-CM | POA: Diagnosis not present

## 2021-09-06 NOTE — Telephone Encounter (Signed)
I just spoke with Randall Hiss and they should be reaching back out to her this week to follow up. I think they thought we did the sample in the office for some reason but will send her a sample kit if she doesn't already have one. ?

## 2021-09-13 ENCOUNTER — Ambulatory Visit
Admission: RE | Admit: 2021-09-13 | Discharge: 2021-09-13 | Disposition: A | Discharge status: Home / Self Care | Payer: PPO | Source: Ambulatory Visit | Attending: Family Medicine | Admitting: Family Medicine

## 2021-09-13 ENCOUNTER — Other Ambulatory Visit: Payer: Self-pay

## 2021-09-13 DIAGNOSIS — Z780 Asymptomatic menopausal state: Secondary | ICD-10-CM | POA: Diagnosis not present

## 2021-09-13 DIAGNOSIS — M85851 Other specified disorders of bone density and structure, right thigh: Secondary | ICD-10-CM | POA: Diagnosis not present

## 2021-09-13 DIAGNOSIS — Z1231 Encounter for screening mammogram for malignant neoplasm of breast: Secondary | ICD-10-CM | POA: Diagnosis not present

## 2021-09-13 DIAGNOSIS — M8580 Other specified disorders of bone density and structure, unspecified site: Secondary | ICD-10-CM

## 2021-09-13 DIAGNOSIS — Z1239 Encounter for other screening for malignant neoplasm of breast: Secondary | ICD-10-CM

## 2021-09-14 ENCOUNTER — Telehealth: Payer: Self-pay | Admitting: Pharmacist

## 2021-09-14 NOTE — Chronic Care Management (AMB) (Signed)
? ? ?Chronic Care Management ?Pharmacy Assistant  ? ?Name: Brittany [name]  MRN: [id number] DOB: 04/07/1956 ? ?Reason for Encounter: Disease State Hyperlipidemia and Asthma Assessment Call ?  ?Conditions to be addressed/monitored: ?HTN and Asthma ? ?Recent office visits:  ?06/14/2021 Micheline Rough MD - Patient was seen for Bipolar affective disorder, current episode depressed, current episode severity unspecified and additional issues. No medication changes. No follow up noted. ? ?Recent consult visits:  ?09/04/2021 Christen Bame NP [facility]) - Patient was seen for CAD in native artery and additional issues. Started atorvastatin 20 mg daily to increase to 40 mg daily if tolerated on 09/25/2021. Follow up at scheduled.  ? ?08/02/2021 Susa Day (orthopedic) - Patient was seen for vertebrogenic low back pain. No other chart notes. ? ?07/23/2021 Susa Day (orthopedic) - Patient was seen for vertebrogenic low back pain. No other chart notes. ? ?07/19/2021 Willow Ora (optometry) - Patient was seen for Keratoconjunctivitis sicca, not specified as Sjogren's, bilateral. No other chart notes.  ? ?07/13/2021 Lacie Draft (orthopedic) - Patient was seen for Other low back pain and Vertebrogenic low back pain.  No other chart notes.  ? ?Hospital visits:  ?None ? ?Medications: ?Outpatient Encounter Medications as of 09/14/2021  ?Medication Sig  ? albuterol (PROVENTIL) (2.5 MG/3ML) 0.083% nebulizer solution Take 3 mLs (2.5 mg total) by nebulization every 6 (six) hours as needed for wheezing or shortness of breath.  ? Albuterol Sulfate (PROAIR RESPICLICK) 341 (90 Base) MCG/ACT AEPB Inhale 2 puffs into the lungs every 6 (six) hours as needed.  ? Ascorbic Acid (VITAMIN C PO) Take by mouth. Super C with vitamin D  ? atorvastatin (LIPITOR) 20 MG tablet Take 1 tablet by mouth daily, If tolerated increase to 40 mg daily on Monday, April 10th  ? Azelastine HCl 137 MCG/SPRAY SOLN PLACE 1 SPRAY IN BOTH NOSTRILS TWO TIMES A  DAY AS DIRECTED  ? b complex vitamins capsule Take 1 capsule by mouth daily.  ? BIOTIN PO Take 5,000 mcg by mouth daily.  ? budesonide-formoterol (SYMBICORT) 160-4.5 MCG/ACT inhaler Inhale 2 puffs into the lungs 2 (two) times daily.  ? busPIRone (BUSPAR) 15 MG tablet Take 1 tablet (15 mg total) by mouth 3 (three) times daily.  ? cetirizine (ZYRTEC) 10 MG tablet Take 1 tablet (10 mg total) by mouth daily.  ? Cholecalciferol (VITAMIN D-3 PO) Take 10,000 Units by mouth daily.   ? colestipol (COLESTID) 1 g tablet Take 2 tablets (2 g total) by mouth 2 (two) times daily.  ? COLLAGEN PO Take by mouth.  ? diazepam (VALIUM) 5 MG tablet TAKE ONE TABLET BY MOUTH EVERY 6 HOURS AS NEEDED FOR ANXIETY  ? dicyclomine (BENTYL) 20 MG tablet TAKE 1 TABLET BY MOUTH FOUR TIMES A DAY AS NEEDED FOR SPASMS  ? doxazosin (CARDURA) 4 MG tablet TAKE TWO TABLETS BY MOUTH EVERY NIGHT AT BEDTIME  ? famotidine (PEPCID) 20 MG tablet Take 1 tablet (20 mg total) by mouth 2 (two) times daily.  ? ferrous sulfate 325 (65 FE) MG EC tablet Take 325 mg by mouth 2 (two) times a week.  ? fluticasone (FLONASE) 50 MCG/ACT nasal spray SPRAY TWO SPRAYS IN EACH NOSTRIL TWICE DAILY  ? HYDROcodone-acetaminophen (NORCO/VICODIN) 5-325 MG tablet Take 1 tablet by mouth every 6 (six) hours as needed.  ? ipratropium (ATROVENT) 0.03 % nasal spray Place 2 sprays into both nostrils every 12 (twelve) hours.  ? MAGNESIUM PO Calcium citrate, magnesium and vitamin D3 combo  ? mirtazapine (REMERON) 30 MG  tablet Take 1 tablet (30 mg total) by mouth at bedtime.  ? montelukast (SINGULAIR) 10 MG tablet TAKE ONE TABLET BY MOUTH EVERY NIGHT AT BEDTIME  ? Multiple Vitamin (MULTIVITAMIN WITH MINERALS) TABS tablet Take 1 tablet by mouth daily.  ? Omega-3 Fatty Acids (FISH OIL PO) Take 1,000 mg by mouth daily.  ? pantoprazole (PROTONIX) 40 MG tablet Take 1 tablet (40 mg total) by mouth 2 (two) times daily.  ? sodium chloride (OCEAN) 0.65 % SOLN nasal spray Place 1 spray into both nostrils  as needed for congestion.  ? TURMERIC CURCUMIN PO Take by mouth.  ? Zinc 50 MG TABS   ? ?Facility-Administered Encounter Medications as of 09/14/2021  ?Medication  ? 0.9 %  sodium chloride infusion  ? 0.9 %  sodium chloride infusion  ?Fill History: ?ALBUTEROL SULFATE 2.5 MG /3 ML (0.083 %) VIAL, NEBULIZER (ML) 08/26/2021 6  ? ?ATORVASTATIN 20 MG TABLET 09/04/2021 90  ? ?AZELASTINE 137 MCG (0.1 %) AEROSOL, SPRAY WITH PUMP (ML) 08/28/2021 50  ? ?COLESTIPOL HCL 1GM TABLET 03/10/2021 30  ? ?DICYCLOMINE 20 MG TABLET 08/26/2021 90  ? ?DOXAZOSIN MESYLATE '4MG'$  TABLET 04/17/2021 90  ? ?MIRTAZAPINE 30 MG TABLET 07/12/2021 90  ? ?montelukast (SINGULAIR) tablet 10 mg 07/10/2021 90  ? ?PANTOPRAZOLE 40 MG TABLET, DELAYED RELEASE (ENTERIC COATED) 07/09/2021 90  ? ?BUSPIRONE 15 MG TABLET 06/21/2021 90  ? ?DIAZEPAM 5 MG TABLET 08/26/2021 30  ? ?   ? View : No data to display.  ?  ?  ?  ? ? ?09/14/2021 ?Name: Brittany [name] MRN: [id number] DOB: 15-Apr-1956 ?[name] is a 66 y.o. year old female who is a primary care patient of Koberlein, Steele Berg, MD.  ?Comprehensive medication review performed; Spoke to patient regarding cholesterol ? ?Lipid Panel ?   ?Component Value Date/Time  ? CHOL 243 (H) 05/10/2021 0934  ? TRIG 72.0 05/10/2021 0934  ? HDL 70.90 05/10/2021 0934  ? [place] 158 (H) 05/10/2021 0934  ? [place] 166 (H) 04/06/2020 8841  ?  ?10-year ASCVD risk score: The 10-year ASCVD risk score (Arnett DK, et al., 2019) is: 5.8% ?  Values used to calculate the score: ?    Age: 66 years ?    Sex: Female ?    Is Non-Hispanic African American: No ?    Diabetic: No ?    Tobacco smoker: No ?    Systolic Blood Pressure: 660 mmHg ?    Is BP treated: No ?    HDL Cholesterol: 70.9 mg/dL ?    Total Cholesterol: 243 mg/dL ? ?Current antihyperlipidemic regimen: Atorvastatin 20 mg daily ? ?Previous antihyperlipidemic medications tried: Simvastatin ? ?ASCVD risk enhancing conditions: age >61 ? ?What recent interventions/DTPs have been made by  any provider to improve Cholesterol control since last CPP Visit: Started atorvastatin 20 mg daily to increase to 40 mg daily if tolerated on 09/25/2021 ? ?Any recent hospitalizations or ED visits since last visit with CPP? No recent hospital visits ? ?What diet changes have been made to improve Cholesterol?  ?Patient follows a ketogenic diet ?Breakfast - patient doesn't generally eat breakfast ?Lunch - patient will have a light lunch or sometimes doesn't eat lunch ?Dinner - patient will have a sandwich, keto pizza or meat and vegetable for dinner ? ?What exercise is being done to improve Cholesterol?  ?Patient will walk on her treadmill daily for 15 min, to the park at times and will go to the gym at times.  ? ?Adherence  Review: ?Does the patient have >5 day gap between last estimated fill dates? No ? ? ?Any recent hospitalizations or ED visits since last visit with CPP? Patient denies any recent hospital visits. ? ?Reports  asthma symptoms of: Patient mentions since starting on Atorvastatin she is having some mild muscle fatigue and also the feeling of exercise induced asthma, she doesn't feel the need to use the inhaler but can sense the feeling.  She was instructed to contact her cardiologist and let them know about this side effect.  ? ?What recent interventions/DTPs have been made by any provider to improve breathing since last visit: No recent interventions.  ? ?Have you had exacerbation/flare-up since last visit? Patient denies other than the issues with Atorvastatin.  ? ?What do you do when you are short of breath?  Patient will use her Albuterol respiclick  and if needed she will use nebulizer.  ? ?Respiratory Devices/Equipment ?Do you have a nebulizer? Albuterol nebulizer as needed  ? ?Do you use a Peak Flow Meter? No ? ?Do you use a maintenance inhaler? Symbicort 160-4.5 mcg/act inhale 2 puffs twice daily. Patient also uses Montelukast 10 mg daily.  ? ?How often do you forget to use your daily inhaler?  Patient does not forget to use daily inhaler. ? ?Do you use a rescue inhaler? Albuterol respiclick as needed  ? ?How often do you use your rescue inhaler?  Patient always uses Albuterol respiclick prior

## 2021-09-27 ENCOUNTER — Ambulatory Visit: Payer: PPO | Admitting: Psychiatry

## 2021-09-27 ENCOUNTER — Encounter: Payer: Self-pay | Admitting: Psychiatry

## 2021-09-27 DIAGNOSIS — F4001 Agoraphobia with panic disorder: Secondary | ICD-10-CM | POA: Diagnosis not present

## 2021-09-27 DIAGNOSIS — F515 Nightmare disorder: Secondary | ICD-10-CM

## 2021-09-27 DIAGNOSIS — F4310 Post-traumatic stress disorder, unspecified: Secondary | ICD-10-CM

## 2021-09-27 DIAGNOSIS — F4312 Post-traumatic stress disorder, chronic: Secondary | ICD-10-CM | POA: Diagnosis not present

## 2021-09-27 DIAGNOSIS — F3162 Bipolar disorder, current episode mixed, moderate: Secondary | ICD-10-CM

## 2021-09-27 NOTE — Progress Notes (Signed)
Brittany [name] ?[id number] ?1956/05/18 ?66 y.o.  ? ? ? ?Subjective:  ? ?Patient ID:  Brittany [name] is a 66 y.o. (DOB 1955-06-29) female. ? ?Chief Complaint:  ?Chief Complaint  ?Patient presents with  ? Follow-up  ? Post-Traumatic Stress Disorder  ? Bipolar 1 disorder, mixed, moderate (HCC)  ? ? ?Anxiety ?Symptoms include nervous/anxious behavior and shortness of breath. Patient reports no confusion, decreased concentration, dizziness or suicidal ideas.  ? ? ?Depression ?       Associated symptoms include fatigue.  Associated symptoms include no decreased concentration and no suicidal ideas.  Past medical history includes anxiety.   ?COPELAND LAPIER presents to the office today for follow-up of long-term depression and anxiety and insomnia.   ? ?At visit September 08, 2018 at which time the patient was weaned off of gabapentin and started on Trileptal up to twice daily.  She has been very med sensitive and difficult to treat because of that. ? ?When seen September 19, 2018.  No meds were changed.  She stopped oxcarbazapine on her own DT itching. ? ?seen July 2020.  She did not want to try any additional medications despite ongoing depression.  She wanted to stop lithium despite risk of worsening depression.  She was given instructions about how to do it in the safest manner possible. ? ?Last seen August 12, 2019..   Still taking diazepam 5 TID, mirtazapine 15, doxazosin increased to 8 mg nightly for nightmares from 6 mg.   ? ?10/16/2019 appointment, the following noted: ?NM much better with increase prazosin to 8 mg HS and anxiety better that was related and tolerated.  Overall anxiety still a problem.  ?depression and anxiety much worse for a week after 2nd Covid vaccine recently.   ?Also overall more anxious and agoraphobia is bad.  Does force herself to go to [employer] weekly.   ?Averaging diazepam 5 mg BID-TID daily.  No drowsiness.  Avoids more at night bc of worsening NM from it. ?Weird dreams and occ awakens with panic.   Lost another "fur baby" that's 2 in 1 year early 2021.  $ and health stress.  Dreams are worse than last visit.  No SE.  Mirtazapine helps sleep generally and much better with it than without it.  ?Post Covid November 2020. ?Got significantly better in depression since here.  Prayed a lot about it and thought the lithium was helping.  Feels better now off it.  Was going through a lot at the time.  But after a month more peace and joy. ?  Buspirone helped.  Diazepam better than Xanax which she feels triggered dissociation and irritabilty. ? Lost her dog which is hard.  Prayed and that helped.  Oldest GS joined special forces and gave her his car.  Anxiety driving but does it.   ?Plan: no med changes ? ?02/09/20 appt with the following noted: ?Went back down on doxazosin to 4 mg bc of dizziness but it's not better.  She plans to talk to her other doctors about it.Marland Kitchen ?CO anxiety and wanting to take more meds for it.  Anxiety without reason generally. ?Increased Diazepam from 5 mg TID to QID end May 2021.   ?Worsening mirtazapine also.  Has done some night eating without memory the next day. ?Doesn't seem to be anxiety causing insomnia.  ?Plan: Start topiramate off label for anxiety and weight loss at 25 mg daily and increase gradually to 100 mg daily. ? ?06/21/2020 appointment with the following noted: ?  Had problems with night terrors again with stress around $ and pet sick.  Getting better again.   ?Still taking topiramate 100 mg started last visit. ?Lost weight DT stomach problems.  Doctors cannot explain.  ?Feels like poor absorption of nutrients.  Difficulty tolerating foods.  Dx bacterial overgrowth in small intestine but corrected. Stomach constantly burns.  Sx were present before topiramate? ?Anxiety is OK if stays home but is worse in public.  Meds are helping anxiety.  Not markedly depressed.  Meds are helping. ? ?11/30/20 appt noted: ?Chronic anxiety averaging ? Amount daily of diazepam.  Anxiety is higher out of  the house.  Gets out of the house regularly to help care for 7 mos old GS.  D took her to beach last week.   ?Has 5 gallon fish tank with some guppies.   ?Occ night terrrors. Occ EMA ?Taking doxazosin 6 mg HS, 4 mg was less effective. ?Generally more trouble with anxiety than depres but some depression occ with $ stress and despise where she lives.   ?Ketogenic diet helped GI usually but some pain today.  Haunted with medical bills. ?Had Covid hosp 11 days and then home with O2 for a month.  Pulm said don't get Covid again.  Keeps her from going to church regularly but that would help. ?Plan: Also topiramate ?Continue buspirone 15 mg 3 times daily,  and mirtazapine 30 mg nightly for sleep. ? Has been compliant with Valium.  She asks about an increase to increase.  OK continue Valium 5 mg  QID prn but take LED. ?Cont buspirone 15 mg TID, doxazosin 6-8 mg HS ? ?03/29/2021 appointment with the following noted: ?Has aquarium. ?Doing OK except some seasonal depression and irritability noticed. ?Increased doxazosin between 6-8 mg HS.  Still having weird dreams but not having NM or night terrrors usually. ?No med changes ? ?09/27/2021 appointment with the following noted: ?A lot better than normal Oct=Feb.  New GS, D married and some things to look forward to but still some anniversaries of losses.  Helping D with 18 mo old GS helps her a lot.  But did gain weight over the winter.  Dx CAD and put on Statin with cog and depressive and anxiety SE.  Current one not as bad but a little more anxiety and using a little more diazepam.  Nervous about people working in her appt. ?Going to [facility] on plane for first time with her family.  Excited but nervous too.   ? ?Worries about weight gain with meds generally fearful of meds.  Chronic $ problems. ? ?Stopped therapy about September 2020. ? ?Multiple med failures. , restoril, diazepam,  ?NO MORE Xanax DT dissociation and irritability,  ?Trazodone NR, Ambien, hyrdroxyzine,  doxazosin, propranolol, mirtazapine  ?sertraline, fluoxetine NR, Lexapro,   ?Buspar 15 TID, gabapentin, ?Latuda, Trileptal itching, lithium NR, Olanzapine, CBZ SE, Lamotrigine, risperidone with AE,   Seroquel SE, VPA, Geodon, Saphris, Abilify, Rexulti seemed to help for short while but $, ? ?Review of Systems:  ?Review of Systems  ?Constitutional:  Positive for fatigue. Negative for unexpected weight change.  ?HENT:  Negative for congestion.   ?Respiratory:  Positive for shortness of breath and wheezing.   ?Gastrointestinal:  Positive for abdominal distention.  ?Musculoskeletal:  Positive for back pain. Negative for gait problem.  ?Neurological:  Negative for dizziness, tremors, weakness and light-headedness.  ?Psychiatric/Behavioral:  Positive for dysphoric mood. Negative for agitation, behavioral problems, confusion, decreased concentration, hallucinations, self-injury, sleep disturbance and suicidal  ideas. The patient is nervous/anxious. The patient is not hyperactive.   ?     Please refer to HPI Stress eating. ? ?Medications: I have reviewed the patient's current medications. ? ?Current Outpatient Medications  ?Medication Sig Dispense Refill  ? albuterol (PROVENTIL) (2.5 MG/3ML) 0.083% nebulizer solution Take 3 mLs (2.5 mg total) by nebulization every 6 (six) hours as needed for wheezing or shortness of breath. 75 mL 12  ? Albuterol Sulfate (PROAIR RESPICLICK) 245 (90 Base) MCG/ACT AEPB Inhale 2 puffs into the lungs every 6 (six) hours as needed. 1 each 6  ? atorvastatin (LIPITOR) 20 MG tablet Take 1 tablet by mouth daily, If tolerated increase to 40 mg daily on Monday, April 10th 180 tablet 3  ? Azelastine HCl 137 MCG/SPRAY SOLN PLACE 1 SPRAY IN BOTH NOSTRILS TWO TIMES A DAY AS DIRECTED 30 mL 1  ? b complex vitamins capsule Take 1 capsule by mouth daily.    ? budesonide-formoterol (SYMBICORT) 160-4.5 MCG/ACT inhaler Inhale 2 puffs into the lungs 2 (two) times daily. 1 each 11  ? busPIRone (BUSPAR) 15 MG tablet  Take 1 tablet (15 mg total) by mouth 3 (three) times daily. 270 tablet 1  ? cetirizine (ZYRTEC) 10 MG tablet Take 1 tablet (10 mg total) by mouth daily. 90 tablet 1  ? Cholecalciferol (VITAMIN D-3 PO) Take 1

## 2021-10-16 ENCOUNTER — Other Ambulatory Visit: Payer: Self-pay | Admitting: Gastroenterology

## 2021-10-16 ENCOUNTER — Other Ambulatory Visit: Payer: Self-pay | Admitting: Psychiatry

## 2021-10-16 DIAGNOSIS — F4310 Post-traumatic stress disorder, unspecified: Secondary | ICD-10-CM

## 2021-10-16 DIAGNOSIS — F515 Nightmare disorder: Secondary | ICD-10-CM

## 2021-10-25 DIAGNOSIS — F3132 Bipolar disorder, current episode depressed, moderate: Secondary | ICD-10-CM | POA: Diagnosis not present

## 2021-10-25 DIAGNOSIS — F4312 Post-traumatic stress disorder, chronic: Secondary | ICD-10-CM | POA: Diagnosis not present

## 2021-10-25 DIAGNOSIS — F331 Major depressive disorder, recurrent, moderate: Secondary | ICD-10-CM | POA: Diagnosis not present

## 2021-10-27 DIAGNOSIS — M7742 Metatarsalgia, left foot: Secondary | ICD-10-CM | POA: Diagnosis not present

## 2021-10-27 DIAGNOSIS — M2042 Other hammer toe(s) (acquired), left foot: Secondary | ICD-10-CM | POA: Diagnosis not present

## 2021-10-27 DIAGNOSIS — M79675 Pain in left toe(s): Secondary | ICD-10-CM | POA: Diagnosis not present

## 2021-11-10 ENCOUNTER — Encounter: Payer: Self-pay | Admitting: Family Medicine

## 2021-11-15 ENCOUNTER — Encounter: Payer: Self-pay | Admitting: Pulmonary Disease

## 2021-11-15 ENCOUNTER — Ambulatory Visit: Payer: PPO | Admitting: Pulmonary Disease

## 2021-11-15 VITALS — BP 118/84 | HR 82 | Ht 61.5 in | Wt 187.2 lb

## 2021-11-15 DIAGNOSIS — R0982 Postnasal drip: Secondary | ICD-10-CM

## 2021-11-15 DIAGNOSIS — J4540 Moderate persistent asthma, uncomplicated: Secondary | ICD-10-CM | POA: Diagnosis not present

## 2021-11-15 NOTE — Patient Instructions (Addendum)
Continue on symbicort 2 puffs twice daily - rinse mouth out after each use  We will provide you with a new spacer   Continue montelukast daily   Continue Zyrtec daily   Continue as needed albuterol   Continue flonase daily  Continue azelastine 1 spray per nostril twice daily  Try saline sinus rinses 1-2 times per day before you use your nasal sprays

## 2021-11-15 NOTE — Progress Notes (Unsigned)
Synopsis: Referred in 2018 for asthma by Caren Macadam, MD.  Previously patient of Dr. Melvyn Novas and Dr. Gwenette Greet.  Subjective:   PATIENT ID: Brittany [name] GENDER: female DOB: [birth year], MRN: [id number]  No chief complaint on file.    HPI Brittany [name] is a 66 year old woman, former smoker with covid 19 pneumonia in 2020 and asthma who returns to pulmonary clinic for follow up.   She has done well since last visit. She is using symbicort 160-4.23mg 2 puffs twice daily and as needed albuterol.   No issues with allergies currently. She remains on zyrtec, singulair, flonase and astelin.   Constant post nasal drainage and congestion in her throat  Lab Results  Component Value Date   NITRICOXIDE 14 07/16/2016    Past Medical History:  Diagnosis Date   Abnormal EKG    Allergy    Anxiety    Asthma    Bipolar depression ([place], Anxiety, PTSD, Panic disorder    -managed by [facility]   Depression    Foot pain    GERD (gastroesophageal reflux disease)    Heart murmur    as a child   Insomnia    Leg swelling    Osteoporosis    osteopenia   Pneumonia due to COVID-19 virus 04/22/2019   Tachycardia      Family History  Problem Relation Age of Onset   Heart failure Mother    Hypertension Mother    Hyperlipidemia Mother    Congestive Heart Failure Mother    Other Father        killed   COPD Paternal A41       a lot of aunts and uncle COPD or Emphysema   Emphysema Paternal Uncle    Colon polyps Maternal Aunt    COPD Brother    Heart disease Brother    Pulmonary embolism Brother    Breast cancer Neg Hx    Colon cancer Neg Hx    Esophageal cancer Neg Hx    Pancreatic cancer Neg Hx    Stomach cancer Neg Hx    Liver disease Neg Hx    Rectal cancer Neg Hx      Past Surgical History:  Procedure Laterality Date   BREAST BIOPSY Right    biopsy of nipple   CARPAL TUNNEL RELEASE Left    CESAREAN SECTION     x 2   CHOLECYSTECTOMY     COLONOSCOPY      ENDOMETRIAL ABLATION     KNEE SURGERY Right    reconstruction for patellar dislocation   LAPAROSCOPY     x 2   TUBAL LIGATION     ULNAR NERVE REPAIR Left    UPPER GASTROINTESTINAL ENDOSCOPY      Social History   Socioeconomic History   Marital status: Single    Spouse name: Not on file   Number of children: 3   Years of education: Not on file   Highest education level: Not on file  Occupational History   Occupation: disabled  Tobacco Use   Smoking status: Former    Packs/day: 20.00    Years: 1.00    Pack years: 20.00    Types: Cigarettes    Quit date: 06/19/1999    Years since quitting: 22.4   Smokeless tobacco: Never   Tobacco comments:    20 year estimate but probably less   Vaping Use   Vaping Use: Never used  Substance and Sexual Activity  Alcohol use: Yes    Alcohol/week: 0.0 standard drinks    Comment: occ   Drug use: No   Sexual activity: Never  Other Topics Concern   Not on file  Social History Narrative   Work or School: Disabled seconndary to psychiatric conditions      Home Situation: lives alone with 2 cats and one dog      Spiritual Beliefs: Christian      Lifestyle: no regular exercise, diet  Not great - wants to embark on healthier lifestyle   Social Determinants of Health   Financial Resource Strain: Low Risk    Difficulty of Paying Living Expenses: Not hard at all  Food Insecurity: No Food Insecurity   Worried About Charity fundraiser in the Last Year: Never true   Arboriculturist in the Last Year: Never true  Transportation Needs: No Transportation Needs   Lack of Transportation (Medical): No   Lack of Transportation (Non-Medical): No  Physical Activity: Inactive   Days of Exercise per Week: 0 days   Minutes of Exercise per Session: 0 min  Stress: Stress Concern Present   Feeling of Stress : To some extent  Social Connections: Moderately Isolated   Frequency of Communication with Friends and Family: More than three times a week    Frequency of Social Gatherings with Friends and Family: More than three times a week   Attends Religious Services: 1 to 4 times per year   Active Member of Clubs or Organizations: No   Attends Archivist Meetings: Never   Marital Status: Never married  Human resources officer Violence: Not At Risk   Fear of Current or Ex-Partner: No   Emotionally Abused: No   Physically Abused: No   Sexually Abused: No     Allergies  Allergen Reactions   Aspirin Other (See Comments)    discomfort   Codeine Itching   Crestor [Rosuvastatin]     Dizzy, leg pain   Gabapentin    Niacin And Related Itching   Sulfa Antibiotics     Other reaction(s): Unknown   Sulfamethoxazole-Trimethoprim Other (See Comments)    Unknown reaction per pt   Topiramate Other (See Comments)    Per patient, it causes stomach burning   Trileptal [Oxcarbazepine]     itching   Trimethoprim Other (See Comments)    Unknown, per pt   Ultram [Tramadol] Itching   Vioxx [Rofecoxib]     Unknown per pt     Immunization History  Administered Date(s) Administered   Fluad Quad(high Dose 65+) 02/28/2021   Influenza Split 04/26/2011   Influenza Whole 03/18/2009, 03/18/2010, 03/18/2016   Influenza,inj,Quad PF,6+ Mos 04/19/2014, 02/28/2017, 03/06/2018, 02/13/2019, 03/04/2020   PFIZER(Purple Top)SARS-COV-2 Vaccination 09/04/2019, 09/29/2019, 04/06/2020, 04/09/2021   PNEUMOCOCCAL CONJUGATE-20 05/10/2021   Pneumococcal Conjugate-13 03/10/2015   Pneumococcal Polysaccharide-23 06/18/2005   Tdap 12/04/2016   Zoster Recombinat (Shingrix) 01/01/2018, 06/22/2018    Outpatient Medications Prior to Visit  Medication Sig Dispense Refill   albuterol (PROVENTIL) (2.5 MG/3ML) 0.083% nebulizer solution Take 3 mLs (2.5 mg total) by nebulization every 6 (six) hours as needed for wheezing or shortness of breath. 75 mL 12   Albuterol Sulfate (PROAIR RESPICLICK) 983 (90 Base) MCG/ACT AEPB Inhale 2 puffs into the lungs every 6 (six) hours as  needed. 1 each 6   atorvastatin (LIPITOR) 20 MG tablet Take 1 tablet by mouth daily, If tolerated increase to 40 mg daily on Monday, April 10th 180 tablet 3   Azelastine  HCl 137 MCG/SPRAY SOLN PLACE 1 SPRAY IN BOTH NOSTRILS TWO TIMES A DAY AS DIRECTED 30 mL 1   b complex vitamins capsule Take 1 capsule by mouth daily.     BIOTIN PO Take 5,000 mcg by mouth daily. (Patient not taking: Reported on 09/27/2021)     budesonide-formoterol (SYMBICORT) 160-4.5 MCG/ACT inhaler Inhale 2 puffs into the lungs 2 (two) times daily. 1 each 11   busPIRone (BUSPAR) 15 MG tablet Take 1 tablet (15 mg total) by mouth 3 (three) times daily. 270 tablet 1   cetirizine (ZYRTEC) 10 MG tablet Take 1 tablet (10 mg total) by mouth daily. 90 tablet 1   Cholecalciferol (VITAMIN D-3 PO) Take 10,000 Units by mouth daily.      colestipol (COLESTID) 1 g tablet Take 2 tablets (2 g total) by mouth 2 (two) times daily. (Patient taking differently: Take 3 g by mouth daily.) 120 tablet 2   COLLAGEN PO Take by mouth.     diazepam (VALIUM) 5 MG tablet TAKE ONE TABLET BY MOUTH EVERY 6 HOURS AS NEEDED FOR ANXIETY 120 tablet 3   dicyclomine (BENTYL) 20 MG tablet TAKE 1 TABLET BY MOUTH FOUR TIMES A DAY AS NEEDED FOR SPASMS 360 tablet 3   doxazosin (CARDURA) 4 MG tablet TAKE TWO TABLETS BY MOUTH EVERY NIGHT AT BEDTIME (Patient taking differently: Take 4 mg by mouth. Takes 1-2 as needed.) 180 tablet 0   famotidine (PEPCID) 20 MG tablet Take 1 tablet (20 mg total) by mouth 2 (two) times daily. 180 tablet 3   ferrous sulfate 325 (65 FE) MG EC tablet Take 325 mg by mouth 2 (two) times a week.     fluticasone (FLONASE) 50 MCG/ACT nasal spray SPRAY TWO SPRAYS IN EACH NOSTRIL TWICE DAILY 96 mL 2   HYDROcodone-acetaminophen (NORCO/VICODIN) 5-325 MG tablet Take 1 tablet by mouth every 6 (six) hours as needed.     ipratropium (ATROVENT) 0.03 % nasal spray Place 2 sprays into both nostrils every 12 (twelve) hours. 30 mL 12   MAGNESIUM PO Calcium citrate,  magnesium and vitamin D3 combo     mirtazapine (REMERON) 30 MG tablet TAKE ONE TABLET BY MOUTH AT BEDTIME 90 tablet 1   montelukast (SINGULAIR) 10 MG tablet TAKE ONE TABLET BY MOUTH EVERY NIGHT AT BEDTIME 90 tablet 1   Multiple Vitamin (MULTIVITAMIN WITH MINERALS) TABS tablet Take 1 tablet by mouth daily.     Omega-3 Fatty Acids (FISH OIL PO) Take 1,000 mg by mouth daily.     pantoprazole (PROTONIX) 40 MG tablet TAKE ONE TABLET BY MOUTH TWICE A DAY 180 tablet 3   sodium chloride (OCEAN) 0.65 % SOLN nasal spray Place 1 spray into both nostrils as needed for congestion.     TURMERIC CURCUMIN PO Take by mouth.     Zinc 50 MG TABS      Facility-Administered Medications Prior to Visit  Medication Dose Route Frequency Provider Last Rate Last Admin   0.9 %  sodium chloride infusion  500 mL Intravenous Once [place] Park, MD       0.9 %  sodium chloride infusion  500 mL Intravenous Once Thornton Park, MD        Review of Systems  Constitutional:  Negative for chills, diaphoresis, fever, malaise/fatigue and weight loss.  HENT:  Negative for congestion and nosebleeds.   Eyes: Negative.   Respiratory:  Negative for cough, hemoptysis, sputum production, shortness of breath and wheezing.   Cardiovascular:  Negative for chest pain, palpitations,  orthopnea, claudication and leg swelling.  Gastrointestinal:  Negative for heartburn and nausea.  Genitourinary: Negative.   Musculoskeletal:  Negative for joint pain and myalgias.  Neurological:  Negative for dizziness, weakness and headaches.  Endo/Heme/Allergies:  Positive for environmental allergies.  Psychiatric/Behavioral: Negative.     Objective:   There were no vitals filed for this visit.    on  RA BMI Readings from Last 3 Encounters:  09/04/21 35.54 kg/m  05/18/21 32.64 kg/m  05/10/21 32.16 kg/m   Wt Readings from Last 3 Encounters:  09/04/21 191 lb 3.2 oz (86.7 kg)  05/18/21 175 lb 9.6 oz (79.7 kg)  05/10/21 173 lb (78.5 kg)     Physical Exam Vitals reviewed.  Constitutional:      General: She is not in acute distress.    Appearance: She is obese. She is not ill-appearing.  HENT:     Head: Normocephalic and atraumatic.  Eyes:     General: No scleral icterus. Cardiovascular:     Rate and Rhythm: Normal rate and regular rhythm.     Heart sounds: No murmur heard. Pulmonary:     Effort: Pulmonary effort is normal.     Breath sounds: Normal breath sounds. No wheezing, rhonchi or rales.  Abdominal:     General: There is no distension.     Palpations: Abdomen is soft.  Musculoskeletal:     Cervical back: Neck supple.     Right lower leg: No edema.     Left lower leg: No edema.  Lymphadenopathy:     Cervical: No cervical adenopathy.  Skin:    General: Skin is warm and dry.     Findings: No rash.  Neurological:     General: No focal deficit present.     Mental Status: She is alert.     Coordination: Coordination normal.  Psychiatric:        Mood and Affect: Mood normal.        Behavior: Behavior normal.   CBC    Component Value Date/Time   WBC 4.4 05/10/2021 0934   RBC 5.04 05/10/2021 0934   HGB 15.5 (H) 05/10/2021 0934   HCT 45.9 05/10/2021 0934   PLT 318.0 05/10/2021 0934   MCV 91.1 05/10/2021 0934   MCH 31.7 04/06/2020 0916   MCHC 33.8 05/10/2021 0934   RDW 14.6 05/10/2021 0934   LYMPHSABS 0.8 05/10/2021 0934   MONOABS 0.3 05/10/2021 0934   EOSABS 0.1 05/10/2021 0934   BASOSABS 0.0 05/10/2021 0934   Chest Imaging- films reviewed: HRCT Chest 03/2020  Moderate patchy ground glass opacity, reticulation and septal thickening throughout both lungs with associated mild traction bronchiectasis and arcitectural distortion, with an upper lobe predominance, asymmetrically prominent on the left. No frank honeycombing. Previously noted acute opacities on 04/22/2019 chest CTA have improved/resolved while the fibrotic components are new.   CXR, 2 view 05/12/2019-left greater than right opacities and  reticulation.  Possible posterior pleural effusion silhouetting hemidiaphragm  CTA chest 04/22/2019-left greater than right peripheral groundglass opacities.  No PE.  CXR 02/11/2020-increased interstitial markings throughout  Echocardiogram 01/26/20: LVEF 60 to 65%, no regional wall motion abnormalities.  Mild concentric LVH, moderate basal septal hypertrophy.  Normal LA.  RV with mildly reduced function, normal size.  Normal RA.  Normal valves.  Pulmonary Functions Testing Results:    Latest Ref Rng & Units 10/25/2020    9:43 AM 09/03/2019    8:41 AM  PFT Results  FVC-Pre L 2.74   2.55  FVC-Predicted Pre % 93   86    FVC-Post L 2.90   2.71    FVC-Predicted Post % 99   91    Pre FEV1/FVC % % 70   70    Post FEV1/FCV % % 69   67    FEV1-Pre L 1.92   1.78    FEV1-Predicted Pre % 85   78    FEV1-Post L 2.00   1.83    DLCO uncorrected ml/min/mmHg 19.95   16.97    DLCO UNC% % 107   91    DLCO corrected ml/min/mmHg 19.95   16.97    DLCO COR %Predicted % 107   91    DLVA Predicted % 105   98    TLC L 5.00   4.48    TLC % Predicted % 105   94    RV % Predicted % 105   99     2021- mild obstruction, no bronchodilator reversibility.  No restriction, hyperinflation, or air trapping.  Normal diffusion.  2022 - mild obstruction present.   Assessment & Plan:   No diagnosis found.  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 At prior visit, spoke with the patient and daughter at length that these fibrotic changes are likely from Covid and we will monitor for any concerns of disease progression but this will not likely follow the path of UIP or true pulmonary fibrosis. - PFTs without restrictive or diffsuion defects in 08/2019 and remain stable today  Allergic rhinosinusitis -Continue Zyrtec - flonase daily  - change to ipratropium nasasl spray from azelastine nasal spray  daily  Follow up in 6 months.  JFreda Jackson MD [facility] Office: [phone]    Current Outpatient Medications:    albuterol (PROVENTIL) (2.5 MG/3ML) 0.083% nebulizer solution, Take 3 mLs (2.5 mg total) by nebulization every 6 (six) hours as needed for wheezing or shortness of breath., Disp: 75 mL, Rfl: 12   Albuterol Sulfate (PROAIR RESPICLICK) 1623(90 Base) MCG/ACT AEPB, Inhale 2 puffs into the lungs every 6 (six) hours as needed., Disp: 1 each, Rfl: 6   atorvastatin (LIPITOR) 20 MG tablet, Take 1 tablet by mouth daily, If tolerated increase to 40 mg daily on Monday, April 10th, Disp: 180 tablet, Rfl: 3   Azelastine HCl 137 MCG/SPRAY SOLN, PLACE 1 SPRAY IN BOTH NOSTRILS TWO TIMES A DAY AS DIRECTED, Disp: 30 mL, Rfl: 1   b complex vitamins capsule, Take 1 capsule by mouth daily., Disp: , Rfl:    BIOTIN PO, Take 5,000 mcg by mouth daily. (Patient not taking: Reported on 09/27/2021), Disp: , Rfl:    budesonide-formoterol (SYMBICORT) 160-4.5 MCG/ACT inhaler, Inhale 2 puffs into the lungs 2 (two) times daily., Disp: 1 each, Rfl: 11   busPIRone (BUSPAR) 15 MG tablet, Take 1 tablet (15 mg total) by mouth 3 (three) times daily., Disp: 270 tablet, Rfl: 1   cetirizine (ZYRTEC) 10 MG tablet, Take 1 tablet (10 mg total) by mouth daily., Disp: 90 tablet, Rfl: 1   Cholecalciferol (VITAMIN D-3 PO), Take 10,000 Units by mouth daily. , Disp: , Rfl:    colestipol (COLESTID) 1 g tablet, Take 2 tablets (2 g total) by mouth 2 (two) times daily. (Patient taking differently: Take 3 g by mouth daily.), Disp: 120 tablet, Rfl: 2   COLLAGEN PO, Take by mouth., Disp: , Rfl:  diazepam (VALIUM) 5 MG tablet, TAKE ONE TABLET BY MOUTH EVERY 6 HOURS AS NEEDED FOR ANXIETY, Disp: 120 tablet, Rfl: 3   dicyclomine (BENTYL) 20 MG tablet, TAKE 1 TABLET BY MOUTH FOUR TIMES A DAY AS NEEDED FOR SPASMS, Disp: 360 tablet, Rfl: 3   doxazosin (CARDURA) 4 MG tablet, TAKE TWO TABLETS BY MOUTH EVERY NIGHT  AT BEDTIME (Patient taking differently: Take 4 mg by mouth. Takes 1-2 as needed.), Disp: 180 tablet, Rfl: 0   famotidine (PEPCID) 20 MG tablet, Take 1 tablet (20 mg total) by mouth 2 (two) times daily., Disp: 180 tablet, Rfl: 3   ferrous sulfate 325 (65 FE) MG EC tablet, Take 325 mg by mouth 2 (two) times a week., Disp: , Rfl:    fluticasone (FLONASE) 50 MCG/ACT nasal spray, SPRAY TWO SPRAYS IN EACH NOSTRIL TWICE DAILY, Disp: 96 mL, Rfl: 2   HYDROcodone-acetaminophen (NORCO/VICODIN) 5-325 MG tablet, Take 1 tablet by mouth every 6 (six) hours as needed., Disp: , Rfl:    ipratropium (ATROVENT) 0.03 % nasal spray, Place 2 sprays into both nostrils every 12 (twelve) hours., Disp: 30 mL, Rfl: 12   MAGNESIUM PO, Calcium citrate, magnesium and vitamin D3 combo, Disp: , Rfl:    mirtazapine (REMERON) 30 MG tablet, TAKE ONE TABLET BY MOUTH AT BEDTIME, Disp: 90 tablet, Rfl: 1   montelukast (SINGULAIR) 10 MG tablet, TAKE ONE TABLET BY MOUTH EVERY NIGHT AT BEDTIME, Disp: 90 tablet, Rfl: 1   Multiple Vitamin (MULTIVITAMIN WITH MINERALS) TABS tablet, Take 1 tablet by mouth daily., Disp: , Rfl:    Omega-3 Fatty Acids (FISH OIL PO), Take 1,000 mg by mouth daily., Disp: , Rfl:    pantoprazole (PROTONIX) 40 MG tablet, TAKE ONE TABLET BY MOUTH TWICE A DAY, Disp: 180 tablet, Rfl: 3   sodium chloride (OCEAN) 0.65 % SOLN nasal spray, Place 1 spray into both nostrils as needed for congestion., Disp: , Rfl:    TURMERIC CURCUMIN PO, Take by mouth., Disp: , Rfl:    Zinc 50 MG TABS, , Disp: , Rfl:   Current Facility-Administered Medications:    0.9 %  sodium chloride infusion, 500 mL, Intravenous, Once, Thornton Park, MD   0.9 %  sodium chloride infusion, 500 mL, Intravenous, Once, [place] Park, MD

## 2021-11-16 ENCOUNTER — Encounter: Payer: Self-pay | Admitting: Pulmonary Disease

## 2021-11-16 NOTE — Progress Notes (Unsigned)
Cardiology Office Note:    Date:  11/17/2021   ID:  Brittany [name], DOB [birth year], MRN [id number]  PCP:  Caren Macadam, MD   [facility] Providers Cardiologist:  Jenkins Rouge, MD     Referring MD: Caren Macadam, MD   Chief Complaint: follow-up elevated calcium score  History of Present Illness:    Brittany [name] is a 66 y.o. female with a hx of COPD, RBBB, family history CAD, asthma, anxiety, panic attacks, PTSD,   She established care with our group in 01/2015 and was seen by Dr. Johnsie Cancel for abnormal EKG which revealed RBBB/LAD/LAFB with no prior tracing for comparison. She had non ischemic myovue 02/11/15 with EF 74%.   She was last seen in our office 01/08/20 for dyspnea. Symptoms were felt to be 2/2 recent COVID-19 infection and she was advised to follow-up with cardiology as needed.  She had a coronary calcium score of 215, 89th percentile for age/sex matched controls. She had hyperlipidemia with LDL 158 in November 2022.  She was last seen in our office by me on 09/05/21 for evaluation due to concern over elevated coronary calcium score.  Strong family history CAD. Has had elevated LDL cholesterol for many years but reports PCP told her it was mildly elevated. She is concerned about recent weight gain despite improvements in diet. She did not have any cardiac symptoms with the exception of feels like her heart is flip-flopping when she lies on her left side, resolves with position change. Has mild bilateral lower extremity edema. Cannot elevate legs when sitting due to issues with her back.  Tried rosuvastatin for elevated cholesterol in the past, felt like she was walking in cement. Reports history of asthma and often feels short of breath. Has joined [employer] and is working on [employer]. Previously had good results with keto diet. Is walking at least 7000 steps daily. Reports extensive lab panel done by PCP due to her insistence with no obvious deficiencies.   We discussed cardiovascular risk reduction strategies and decided that we would plan for soon follow-up for further discussion.   Today, she reports she is very fatigued. Had extensive lab work by PCP November 2022 and is taking some supplements as a result. Does not have the motivation to exercise. We had discussed the importance of CV risk reduction at last office visit. Reports dietary limitations due to lots of GI issues, has recently realized she cannot tolerate most forms of sugar. Questionable tolerance of fruit. High fiber diet causes her GI distress.  Is seeking a second opinion from [place] but cannot get an appointment until August. Wants to return to therapy due to increased anxiety. She was under the impression that she had an 89% higher chance of having a stroke or heart attack due to her recent coronary calcium score. She denies chest pain, shortness of breath, lower extremity edema, palpitations, melena, hematuria, hemoptysis, diaphoresis, weakness, presyncope, syncope, orthopnea, and PND.  Past Medical History:  Diagnosis Date   Abnormal EKG    Allergy    Anxiety    Asthma    Bipolar depression ([place]), Anxiety, PTSD, Panic disorder    -managed by [facility]   Depression    Foot pain    GERD (gastroesophageal reflux disease)    Heart murmur    as a child   Insomnia    Leg swelling    Osteoporosis    osteopenia   Pneumonia due to COVID-19 virus 04/22/2019  Tachycardia     Past Surgical History:  Procedure Laterality Date   BREAST BIOPSY Right    biopsy of nipple   CARPAL TUNNEL RELEASE Left    CESAREAN SECTION     x 2   CHOLECYSTECTOMY     COLONOSCOPY     ENDOMETRIAL ABLATION     KNEE SURGERY Right    reconstruction for patellar dislocation   LAPAROSCOPY     x 2   TUBAL LIGATION     ULNAR NERVE REPAIR Left    UPPER GASTROINTESTINAL ENDOSCOPY      Current Medications: Current Meds  Medication Sig   albuterol (PROVENTIL) (2.5 MG/3ML) 0.083%  nebulizer solution Take 3 mLs (2.5 mg total) by nebulization every 6 (six) hours as needed for wheezing or shortness of breath.   Albuterol Sulfate (PROAIR RESPICLICK) 403 (90 Base) MCG/ACT AEPB Inhale 2 puffs into the lungs every 6 (six) hours as needed.   atorvastatin (LIPITOR) 20 MG tablet Take 1 tablet by mouth daily, If tolerated increase to 40 mg daily on Monday, April 10th   Azelastine HCl 137 MCG/SPRAY SOLN PLACE 1 SPRAY IN BOTH NOSTRILS TWO TIMES A DAY AS DIRECTED   b complex vitamins capsule Take 1 capsule by mouth daily.   budesonide-formoterol (SYMBICORT) 160-4.5 MCG/ACT inhaler Inhale 2 puffs into the lungs 2 (two) times daily.   busPIRone (BUSPAR) 15 MG tablet Take 1 tablet (15 mg total) by mouth 3 (three) times daily.   cetirizine (ZYRTEC) 10 MG tablet Take 1 tablet (10 mg total) by mouth daily.   Cholecalciferol (VITAMIN D-3 PO) Take 10,000 Units by mouth daily.    colestipol (COLESTID) 1 g tablet Take 2 tablets (2 g total) by mouth 2 (two) times daily.   COLLAGEN PO Take by mouth.   diazepam (VALIUM) 5 MG tablet TAKE ONE TABLET BY MOUTH EVERY 6 HOURS AS NEEDED FOR ANXIETY   dicyclomine (BENTYL) 20 MG tablet TAKE 1 TABLET BY MOUTH FOUR TIMES A DAY AS NEEDED FOR SPASMS   doxazosin (CARDURA) 4 MG tablet TAKE TWO TABLETS BY MOUTH EVERY NIGHT AT BEDTIME   famotidine (PEPCID) 20 MG tablet Take 1 tablet (20 mg total) by mouth 2 (two) times daily.   ferrous sulfate 325 (65 FE) MG EC tablet Take 325 mg by mouth 2 (two) times a week.   fluticasone (FLONASE) 50 MCG/ACT nasal spray SPRAY TWO SPRAYS IN EACH NOSTRIL TWICE DAILY   HYDROcodone-acetaminophen (NORCO/VICODIN) 5-325 MG tablet Take 1 tablet by mouth every 6 (six) hours as needed.   MAGNESIUM PO Calcium citrate, magnesium and vitamin D3 combo   mirtazapine (REMERON) 30 MG tablet TAKE ONE TABLET BY MOUTH AT BEDTIME   montelukast (SINGULAIR) 10 MG tablet TAKE ONE TABLET BY MOUTH EVERY NIGHT AT BEDTIME   Multiple Vitamin (MULTIVITAMIN  WITH MINERALS) TABS tablet Take 1 tablet by mouth daily.   pantoprazole (PROTONIX) 40 MG tablet TAKE ONE TABLET BY MOUTH TWICE A DAY   sodium chloride (OCEAN) 0.65 % SOLN nasal spray Place 1 spray into both nostrils as needed for congestion.   Current Facility-Administered Medications for the 11/17/21 encounter (Office Visit) with Ann Maki, Lanice Schwab, NP  Medication   0.9 %  sodium chloride infusion   0.9 %  sodium chloride infusion     Allergies:   Aspirin, Codeine, Crestor [rosuvastatin], Gabapentin, Niacin and related, Sulfa antibiotics, Sulfamethoxazole-trimethoprim, Topiramate, Trileptal [oxcarbazepine], Trimethoprim, Ultram [tramadol], and Vioxx [rofecoxib]   Social History   Socioeconomic History   Marital status: Single  Spouse name: Not on file   Number of children: 3   Years of education: Not on file   Highest education level: Not on file  Occupational History   Occupation: disabled  Tobacco Use   Smoking status: Former    Packs/day: 20.00    Years: 1.00    Pack years: 20.00    Types: Cigarettes    Quit date: 06/19/1999    Years since quitting: 22.4   Smokeless tobacco: Never   Tobacco comments:    20 year estimate but probably less   Vaping Use   Vaping Use: Never used  Substance and Sexual Activity   Alcohol use: Yes    Alcohol/week: 0.0 standard drinks    Comment: occ   Drug use: No   Sexual activity: Never  Other Topics Concern   Not on file  Social History Narrative   Work or School: Disabled seconndary to psychiatric conditions      Home Situation: lives alone with 2 cats and one dog      Spiritual Beliefs: Christian      Lifestyle: no regular exercise, diet  Not great - wants to embark on healthier lifestyle   Social Determinants of Health   Financial Resource Strain: Low Risk    Difficulty of Paying Living Expenses: Not hard at all  Food Insecurity: No Food Insecurity   Worried About Charity fundraiser in the Last Year: Never true   Youth worker in the Last Year: Never true  Transportation Needs: No Transportation Needs   Lack of Transportation (Medical): No   Lack of Transportation (Non-Medical): No  Physical Activity: Inactive   Days of Exercise per Week: 0 days   Minutes of Exercise per Session: 0 min  Stress: Stress Concern Present   Feeling of Stress : To some extent  Social Connections: Moderately Isolated   Frequency of Communication with Friends and Family: More than three times a week   Frequency of Social Gatherings with Friends and Family: More than three times a week   Attends Religious Services: 1 to 4 times per year   Active Member of [employer] or Organizations: No   Attends Archivist Meetings: Never   Marital Status: Never married     Family History: The patient's family history includes COPD in her brother and paternal aunt; Colon polyps in her maternal aunt; Congestive Heart Failure in her mother; Emphysema in her paternal uncle; Heart disease in her brother; Heart failure in her mother; Hyperlipidemia in her mother; Hypertension in her mother; Other in her father; Pulmonary embolism in her brother. There is no history of Breast cancer, Colon cancer, Esophageal cancer, Pancreatic cancer, Stomach cancer, Liver disease, or Rectal cancer.  ROS:   Please see the history of present illness.  + bilateral lower extremity swelling  All other systems reviewed and are negative.  Labs/Other Studies Reviewed:    The following studies were reviewed today:  CT Calcium Score 07/21/21  Coronary arteries: Normal origins.   Coronary Calcium Score:   Left main: 0   Left anterior descending artery: 181   Left circumflex artery: 0   Right coronary artery: 33.8   Total: 215   Percentile: 89th   Pericardium: Normal.   Ascending Aorta: Normal caliber.   Non-cardiac: See separate report from [facility].   IMPRESSION: Coronary calcium score of 215. This was 89th percentile for  age-, race-, and sex-matched controls.  Echo 01/2020  Left Ventricle: Left ventricular ejection  fraction, by estimation, is 60  to 65%. The left ventricle has normal function. The left ventricle has no  regional wall motion abnormalities. The average left ventricular global  longitudinal strain is -22.6 %.  The global longitudinal strain is normal. The left ventricular internal  cavity size was normal in size. There is mild concentric left ventricular  hypertrophy. Left ventricular diastolic parameters are consistent with  Grade I diastolic dysfunction  (impaired relaxation). Elevated left ventricular end-diastolic pressure.  Right Ventricle: The right ventricular size is normal. No increase in  right ventricular wall thickness. Right ventricular systolic function is  mildly reduced. There is normal pulmonary artery systolic pressure. The  tricuspid regurgitant velocity is 2.08  m/s, and with an assumed right atrial pressure of 3 mmHg, the estimated  right ventricular systolic pressure is 67.2 mmHg.  Left Atrium: Left atrial size was normal in size.  Right Atrium: Right atrial size was normal in size.  Pericardium: There is no evidence of pericardial effusion.  Mitral Valve: The mitral valve is normal in structure. Normal mobility of  the mitral valve leaflets. No evidence of mitral valve regurgitation. No  evidence of mitral valve stenosis.  Tricuspid Valve: The tricuspid valve is normal in structure. Tricuspid  valve regurgitation is trivial. No evidence of tricuspid stenosis.  Aortic Valve: The aortic valve is normal in structure. Aortic valve  regurgitation is not visualized. No aortic stenosis is present.  Pulmonic Valve: The pulmonic valve was normal in structure. Pulmonic valve  regurgitation is trivial. No evidence of pulmonic stenosis.  Aorta: The aortic root is normal in size and structure.  Venous: The inferior vena cava is normal in size with greater than 50%  respiratory  variability, suggesting right atrial pressure of 3 mmHg.  IAS/Shunts: No atrial level shunt detected by color flow Doppler.    Recent Labs: 05/10/2021: ALT 14; BUN 12; Creatinine, Ser 0.67; Hemoglobin 15.5; Platelets 318.0; Potassium 4.4; Sodium 141; TSH 1.75  Recent Lipid Panel    Component Value Date/Time   CHOL 243 (H) 05/10/2021 0934   TRIG 72.0 05/10/2021 0934   HDL 70.90 05/10/2021 0934   CHOLHDL 3 05/10/2021 0934   VLDL 14.4 05/10/2021 0934   LDLCALC 158 (H) 05/10/2021 0934   LDLCALC 166 (H) 04/06/2020 0916     Risk Assessment/Calculations:       Physical Exam:    VS:  BP 102/64   Pulse 82   Ht '5\' 1"'$  (1.549 m)   Wt 187 lb 3.2 oz (84.9 kg)   SpO2 96%   BMI 35.37 kg/m     Wt Readings from Last 3 Encounters:  11/17/21 187 lb 3.2 oz (84.9 kg)  11/15/21 187 lb 3.2 oz (84.9 kg)  09/04/21 191 lb 3.2 oz (86.7 kg)     GEN:  Well developed, obese female in no acute distress HEENT: Normal NECK: No JVD; No carotid bruits CARDIAC: RRR, no murmurs, rubs, gallops RESPIRATORY:  Clear to auscultation without rales, wheezing or rhonchi  ABDOMEN: Soft, non-tender, non-distended MUSCULOSKELETAL:  Mild bilateral non-pitting lower extremity edema; No deformity. 2+ pedal pulses, equal bilaterally SKIN: Warm and dry NEUROLOGIC:  Alert and oriented x 3 PSYCHIATRIC:  Normal affect   EKG:  EKG is not ordered today.    Diagnoses:    1. CAD in native artery   2. Hyperlipidemia LDL goal <70   3. Obesity (BMI 30-39.9)   4. Right bundle branch block   5. Dyspnea, unspecified type   6. Elevated coronary  artery calcium score     Assessment and Plan:     CAD without angina/Elevated coronary calcium score: Again discussed the importance of risk factor reduction due to calcium score of 215.  Clarified the meaning of 89th percentile calcium score for her age/race/sex matched controls. ASCVD risk score is 7.2%. She is grateful for the clarification. She denies chest pain, dyspnea, or  other symptoms concerning for angina.  No indication for further ischemic evaluation at this time. Encouraged regular physical activity to achieve 150 minutes moderate intensity exercise each week.  Heart healthy mostly plant-based Mediterranean style diet. Aggressive cholesterol management. She is tolerating atorvastatin 40 mg daily.   Dyspnea: Reported at last office visit, she denies c/o dyspnea today. Normal LVEF, mild LVH by echo 01/2020. No edema, orthopnea, or PND. Hx asthma.   Hyperlipidemia LDL goal < 70: LDL 158 05/10/21. Now on high dose statin therapy since March. We will recheck lipid and liver panel today.   Depression: Reports difficulty executing the things she thinks she wants to do. We discussed the correlation of fatigue with depression and she agrees she thinks she may be depressed. Is followed by a psychiatrist and is currently seeking a therapist. Good family support. Regular exercise encouraged.   RBBB: EKG not done today. Will repeat EKG at next office visit.   Hypertension: Takes doxazosin for nightmares associated with PTSD. BP is well-controlled.   Obesity: Encouraged lifestyle modification including increased moderate physical activity to achieve 150 minutes per week, heart healthy low sodium diet, such as mediterranean, weight loss.   Disposition: 1 year with Dr. Johnsie Cancel   Medication Adjustments/Labs and Tests Ordered: Current medicines are reviewed at length with the patient today.  Concerns regarding medicines are outlined above.  Orders Placed This Encounter  Procedures   Lipid Profile   Comprehensive metabolic panel   No orders of the defined types were placed in this encounter.   Patient Instructions  Medication Instructions:  Your physician recommends that you continue on your current medications as directed. Please refer to the Current Medication list given to you today.  *If you need a refill on your cardiac medications before your next appointment,  please call your pharmacy*   Lab Work: Lipid and CMET today If you have labs (blood work) drawn today and your tests are completely normal, you will receive your results only by: [place] (if you have MyChart) OR A paper copy in the mail If you have any lab test that is abnormal or we need to change your treatment, we will call you to review the results.  Follow-Up: At [facility], you and your health needs are our priority.  As part of our continuing mission to provide you with exceptional heart care, we have created designated Provider Care Teams.  These Care Teams include your primary Cardiologist (physician) and Advanced Practice Providers (APPs -  Physician Assistants and Nurse Practitioners) who all work together to provide you with the care you need, when you need it.  Your next appointment:   1 year(s)  The format for your next appointment:   In Person  Provider:   Jenkins Rouge, MD {  Important Information About Sugar        Adopting a Healthy Lifestyle.   Weight: Know what a healthy weight is for you (roughly BMI <25) and aim to maintain this. You can calculate your body mass index on your smart phone  Diet: Aim for 7+ servings of fruits and vegetables daily Limit  animal fats in diet for cholesterol and heart health - choose grass fed whenever available Avoid highly processed foods (fast food burgers, tacos, fried chicken, pizza, hot dogs, french fries)  Saturated fat comes in the form of butter, lard, coconut oil, margarine, partially hydrogenated oils, and fat in meat. These increase your risk of cardiovascular disease.  Use healthy plant oils, such as olive, canola, soy, corn, sunflower and peanut.  Whole foods such as fruits, vegetables and whole grains have fiber  Men need > 38 grams of fiber per day Women need > 25 grams of fiber per day  Load up on vegetables and fruits - one-half of your plate: Aim for color and variety, and remember that potatoes  dont count. Go for whole grains - one-quarter of your plate: Whole wheat, barley, wheat berries, quinoa, oats, brown rice, and foods made with them. If you want pasta, go with whole wheat pasta. Protein power - one-quarter of your plate: Fish, chicken, beans, and nuts are all healthy, versatile protein sources. Limit red meat. You need carbohydrates for energy! The type of carbohydrate is more important than the amount. Choose carbohydrates such as vegetables, fruits, whole grains, beans, and nuts in the place of white rice, white pasta, potatoes (baked or fried), macaroni and cheese, cakes, cookies, and donuts.  If youre thirsty, drink water. Coffee and tea are good in moderation, but skip sugary drinks and limit milk and dairy products to one or two daily servings. Keep sugar intake at 6 teaspoons or 24 grams or LESS       Exercise: Aim for 150 min of moderate intensity exercise weekly for heart health, and weights twice weekly for bone health Stay active - any steps are better than no steps! Aim for 7-9 hours of sleep daily           Signed, Emmaline Life, NP  11/17/2021 9:58 AM    [place]

## 2021-11-17 ENCOUNTER — Ambulatory Visit: Payer: PPO | Admitting: Nurse Practitioner

## 2021-11-17 ENCOUNTER — Encounter: Payer: Self-pay | Admitting: Nurse Practitioner

## 2021-11-17 VITALS — BP 102/64 | HR 82 | Ht 61.0 in | Wt 187.2 lb

## 2021-11-17 DIAGNOSIS — E785 Hyperlipidemia, unspecified: Secondary | ICD-10-CM

## 2021-11-17 DIAGNOSIS — I4510 Unspecified right bundle-branch block: Secondary | ICD-10-CM | POA: Diagnosis not present

## 2021-11-17 DIAGNOSIS — R931 Abnormal findings on diagnostic imaging of heart and coronary circulation: Secondary | ICD-10-CM

## 2021-11-17 DIAGNOSIS — R0600 Dyspnea, unspecified: Secondary | ICD-10-CM

## 2021-11-17 DIAGNOSIS — I2510 Atherosclerotic heart disease of native coronary artery without angina pectoris: Secondary | ICD-10-CM

## 2021-11-17 DIAGNOSIS — E669 Obesity, unspecified: Secondary | ICD-10-CM | POA: Diagnosis not present

## 2021-11-17 DIAGNOSIS — E7800 Pure hypercholesterolemia, unspecified: Secondary | ICD-10-CM | POA: Diagnosis not present

## 2021-11-17 LAB — LIPID PANEL
Chol/HDL Ratio: 2.7 ratio (ref 0.0–4.4)
Cholesterol, Total: 166 mg/dL (ref 100–199)
HDL: 62 mg/dL (ref >39)
LDL Chol Calc (NIH): 90 mg/dL (ref 0–99)
Triglycerides: 76 mg/dL (ref 0–149)
VLDL Cholesterol Cal: 14 mg/dL (ref 5–40)

## 2021-11-17 LAB — COMPREHENSIVE METABOLIC PANEL
ALT: 23 IU/L (ref 0–32)
AST: 16 IU/L (ref 0–40)
Albumin/Globulin Ratio: 2.7 — ABNORMAL HIGH (ref 1.2–2.2)
Albumin: 4.8 g/dL (ref 3.8–4.8)
Alkaline Phosphatase: 68 IU/L (ref 44–121)
BUN/Creatinine Ratio: 21 (ref 12–28)
BUN: 16 mg/dL (ref 8–27)
Bilirubin Total: 0.4 mg/dL (ref 0.0–1.2)
CO2: 25 mmol/L (ref 20–29)
Calcium: 9.7 mg/dL (ref 8.7–10.3)
Chloride: 102 mmol/L (ref 96–106)
Creatinine, Ser: 0.75 mg/dL (ref 0.57–1.00)
Globulin, Total: 1.8 g/dL (ref 1.5–4.5)
Glucose: 94 mg/dL (ref 70–99)
Potassium: 4.4 mmol/L (ref 3.5–5.2)
Sodium: 141 mmol/L (ref 134–144)
Total Protein: 6.6 g/dL (ref 6.0–8.5)
eGFR: 88 mL/min/{1.73_m2} (ref >59)

## 2021-11-17 NOTE — Patient Instructions (Addendum)
Medication Instructions:  Your physician recommends that you continue on your current medications as directed. Please refer to the Current Medication list given to you today.  *If you need a refill on your cardiac medications before your next appointment, please call your pharmacy*   Lab Work: Lipid and CMET today If you have labs (blood work) drawn today and your tests are completely normal, you will receive your results only by: [place] (if you have MyChart) OR A paper copy in the mail If you have any lab test that is abnormal or we need to change your treatment, we will call you to review the results.  Follow-Up: At [facility], you and your health needs are our priority.  As part of our continuing mission to provide you with exceptional heart care, we have created designated Provider Care Teams.  These Care Teams include your primary Cardiologist (physician) and Advanced Practice Providers (APPs -  Physician Assistants and Nurse Practitioners) who all work together to provide you with the care you need, when you need it.  Your next appointment:   1 year(s)  The format for your next appointment:   In Person  Provider:   Jenkins Rouge, MD {  Important Information About Sugar        Adopting a Healthy Lifestyle.   Weight: Know what a healthy weight is for you (roughly BMI <25) and aim to maintain this. You can calculate your body mass index on your smart phone  Diet: Aim for 7+ servings of fruits and vegetables daily Limit animal fats in diet for cholesterol and heart health - choose grass fed whenever available Avoid highly processed foods (fast food burgers, tacos, fried chicken, pizza, hot dogs, french fries)  Saturated fat comes in the form of butter, lard, coconut oil, margarine, partially hydrogenated oils, and fat in meat. These increase your risk of cardiovascular disease.  Use healthy plant oils, such as olive, canola, soy, corn, sunflower and peanut.   Whole foods such as fruits, vegetables and whole grains have fiber  Men need > 38 grams of fiber per day Women need > 25 grams of fiber per day  Load up on vegetables and fruits - one-half of your plate: Aim for color and variety, and remember that potatoes dont count. Go for whole grains - one-quarter of your plate: Whole wheat, barley, wheat berries, quinoa, oats, brown rice, and foods made with them. If you want pasta, go with whole wheat pasta. Protein power - one-quarter of your plate: Fish, chicken, beans, and nuts are all healthy, versatile protein sources. Limit red meat. You need carbohydrates for energy! The type of carbohydrate is more important than the amount. Choose carbohydrates such as vegetables, fruits, whole grains, beans, and nuts in the place of white rice, white pasta, potatoes (baked or fried), macaroni and cheese, cakes, cookies, and donuts.  If youre thirsty, drink water. Coffee and tea are good in moderation, but skip sugary drinks and limit milk and dairy products to one or two daily servings. Keep sugar intake at 6 teaspoons or 24 grams or LESS       Exercise: Aim for 150 min of moderate intensity exercise weekly for heart health, and weights twice weekly for bone health Stay active - any steps are better than no steps! Aim for 7-9 hours of sleep daily

## 2021-11-20 ENCOUNTER — Telehealth: Payer: Self-pay | Admitting: *Deleted

## 2021-11-20 DIAGNOSIS — E785 Hyperlipidemia, unspecified: Secondary | ICD-10-CM

## 2021-11-20 NOTE — Telephone Encounter (Signed)
See Mychart message.  Printed and left at the front desk for the patient to pick up.

## 2021-11-20 NOTE — Telephone Encounter (Signed)
Pt viewed recommendations via Social research officer, government

## 2021-11-20 NOTE — Telephone Encounter (Signed)
-----   Message from Sheila Oats sent at 11/17/2021  4:47 PM EDT ----- Please abstract and route to provider

## 2021-11-26 ENCOUNTER — Other Ambulatory Visit: Payer: Self-pay | Admitting: Gastroenterology

## 2021-11-26 ENCOUNTER — Other Ambulatory Visit: Payer: Self-pay | Admitting: Psychiatry

## 2021-11-26 DIAGNOSIS — F4001 Agoraphobia with panic disorder: Secondary | ICD-10-CM

## 2021-11-26 DIAGNOSIS — K219 Gastro-esophageal reflux disease without esophagitis: Secondary | ICD-10-CM

## 2021-11-26 DIAGNOSIS — E739 Lactose intolerance, unspecified: Secondary | ICD-10-CM

## 2021-11-26 DIAGNOSIS — K6389 Other specified diseases of intestine: Secondary | ICD-10-CM

## 2021-11-26 DIAGNOSIS — R140 Abdominal distension (gaseous): Secondary | ICD-10-CM

## 2021-11-26 DIAGNOSIS — R197 Diarrhea, unspecified: Secondary | ICD-10-CM

## 2021-11-27 NOTE — Telephone Encounter (Signed)
Last filled 5/2 appt on 8/10

## 2021-12-05 ENCOUNTER — Telehealth: Payer: Self-pay | Admitting: Gastroenterology

## 2021-12-06 ENCOUNTER — Other Ambulatory Visit: Payer: Self-pay

## 2021-12-06 NOTE — Telephone Encounter (Signed)
Confirmed with patient that she does want an Endocrinology referral due to chronic abdominal pain & diarrhea. She requested referral be sent to [facility] [phone]. Referral has been faxed, and patient advised to call back if she has not heard from their office within two weeks. Patient verbalized all understanding, no further questions.

## 2021-12-06 NOTE — Telephone Encounter (Signed)
Mailed to home address per patient request.

## 2021-12-08 DIAGNOSIS — M7742 Metatarsalgia, left foot: Secondary | ICD-10-CM | POA: Diagnosis not present

## 2021-12-08 DIAGNOSIS — M2042 Other hammer toe(s) (acquired), left foot: Secondary | ICD-10-CM | POA: Diagnosis not present

## 2021-12-12 ENCOUNTER — Telehealth: Payer: Self-pay | Admitting: *Deleted

## 2021-12-13 ENCOUNTER — Other Ambulatory Visit: Payer: Self-pay | Admitting: Pulmonary Disease

## 2021-12-13 NOTE — Telephone Encounter (Signed)
I will fax over clearance notes to requesting office per pre op provider

## 2021-12-13 NOTE — Telephone Encounter (Signed)
   Primary Cardiologist: Jenkins Rouge, MD  Chart reviewed as part of pre-operative protocol coverage. Given past medical history and time since last visit, based on ACC/AHA guidelines, Brittany [name] would be at acceptable risk for the planned procedure without further cardiovascular testing.   She can achieve > 4 METS activity without cardiac symptoms. Her RCRI for MACE is low at 3.9% (0 points) using Goldman Cardiac Risk Index.   I will route this recommendation to the requesting party via Epic fax function and remove from pre-op pool.  Please call with questions.  Emmaline Life, NP-C    12/13/2021, 7:12 AM [place] [address], [address] Office [phone] Fax [phone]

## 2021-12-25 NOTE — Telephone Encounter (Signed)
I s/w the pt and assured her that clearance notes have been re-faxed again to Dr. Nona Dell office. I received a confirmation page that fax went through.

## 2021-12-25 NOTE — Telephone Encounter (Signed)
Pt is calling back stating that Dr. Jenny Reichmann Hewitt's office has not received a fax for the pre-op information. Requesting a call back to discuss this.

## 2021-12-25 NOTE — Telephone Encounter (Signed)
I will re-fax the clearance notes again to the surgeon office.

## 2021-12-26 ENCOUNTER — Other Ambulatory Visit (HOSPITAL_COMMUNITY): Payer: Self-pay | Admitting: Orthopedic Surgery

## 2022-01-04 ENCOUNTER — Encounter (HOSPITAL_BASED_OUTPATIENT_CLINIC_OR_DEPARTMENT_OTHER): Payer: Self-pay | Admitting: Orthopedic Surgery

## 2022-01-04 ENCOUNTER — Other Ambulatory Visit: Payer: Self-pay

## 2022-01-05 NOTE — Progress Notes (Signed)
Pt states is allergic to pre surgery drink, instructed pt to drink 8 oz. Water on day of surgery, pt verbalized understanding.

## 2022-01-05 NOTE — Progress Notes (Signed)
   Established Patient Office Visit  Subjective   Patient ID: Brittany [name], female    DOB: 12/20/1955  Age: 66 y.o. MRN: [id number]  No chief complaint on file.   HPI  {History (Optional):23778}  ROS    Objective:     There were no vitals taken for this visit. {Vitals History (Optional):23777}  Physical Exam   No results found for any visits on 01/08/22.  {Labs (Optional):23779}  The 10-year ASCVD risk score (Arnett DK, et al., 2019) is: 3.5%    Assessment & Plan:   Problem List Items Addressed This Visit   None   No follow-ups on file.    Farrel Conners, MD

## 2022-01-08 ENCOUNTER — Encounter: Payer: Self-pay | Admitting: Family Medicine

## 2022-01-08 ENCOUNTER — Other Ambulatory Visit: Payer: Self-pay | Admitting: Pulmonary Disease

## 2022-01-08 ENCOUNTER — Ambulatory Visit (INDEPENDENT_AMBULATORY_CARE_PROVIDER_SITE_OTHER): Payer: PPO | Admitting: Family Medicine

## 2022-01-08 VITALS — BP 102/72 | HR 80 | Temp 98.0°F | Ht 61.0 in | Wt 189.7 lb

## 2022-01-08 DIAGNOSIS — M8580 Other specified disorders of bone density and structure, unspecified site: Secondary | ICD-10-CM | POA: Diagnosis not present

## 2022-01-08 DIAGNOSIS — J449 Chronic obstructive pulmonary disease, unspecified: Secondary | ICD-10-CM

## 2022-01-08 DIAGNOSIS — R5383 Other fatigue: Secondary | ICD-10-CM

## 2022-01-08 DIAGNOSIS — E782 Mixed hyperlipidemia: Secondary | ICD-10-CM

## 2022-01-08 LAB — POCT GLYCOSYLATED HEMOGLOBIN (HGB A1C): Hemoglobin A1C: 5.1 % (ref 4.0–5.6)

## 2022-01-08 NOTE — Assessment & Plan Note (Addendum)
Unclear etiology. Differential dx is very broad and can be from multiple etiologies. A1C today is office is 5.1, she has multiple specialists including pulmonary, GI, psychiatry, etc. Her A1C is negative for DM today, she has had multiple labs and testing done. No other associated symptoms that would indicate a specific source to her fatigue. Will continue to work on this problem in subsequent visits.

## 2022-01-08 NOTE — Assessment & Plan Note (Signed)
Pt currently on vitamin D supplements, last level was very good, will repeat DEXA in 2 years to recheck bone density.

## 2022-01-08 NOTE — Assessment & Plan Note (Signed)
Patient currently on cholestid for her GI issues but this is likely also helping with her cholesterol. We reviewed her lipid panel today and it Is very well controlled despite her CT calcium score. I reassured the patient that her overal CAD risk is very low (3.5%) and that we do not need to make any medication changes at this time. Will continue to monitor lipids every 6 months.

## 2022-01-08 NOTE — Assessment & Plan Note (Signed)
Current BMI is 35, patient has had great success with keto in the past, is currently on the Mediterranean diet per her report. Her weight is stable from previous. She reports that because of foot and hip pain she cannot exercise like she really wants to, still tried to walk regularly every day. I advised she continue to reduce carb in her diet and we will follow up on this problem in 3 months.

## 2022-01-08 NOTE — Assessment & Plan Note (Signed)
Lung are clear on exam, she sees Pulmonary regularly and is controlled on her current meds. Oxygen levels also WNL. Continue to monitor.

## 2022-01-11 ENCOUNTER — Ambulatory Visit (HOSPITAL_BASED_OUTPATIENT_CLINIC_OR_DEPARTMENT_OTHER): Payer: PPO

## 2022-01-11 ENCOUNTER — Other Ambulatory Visit: Payer: Self-pay

## 2022-01-11 ENCOUNTER — Encounter (HOSPITAL_BASED_OUTPATIENT_CLINIC_OR_DEPARTMENT_OTHER)
Admission: RE | Disposition: A | Discharge status: Home / Self Care | Payer: Self-pay | Source: Home / Self Care | Attending: Orthopedic Surgery

## 2022-01-11 ENCOUNTER — Ambulatory Visit (HOSPITAL_BASED_OUTPATIENT_CLINIC_OR_DEPARTMENT_OTHER): Payer: PPO | Admitting: Anesthesiology

## 2022-01-11 ENCOUNTER — Ambulatory Visit (HOSPITAL_BASED_OUTPATIENT_CLINIC_OR_DEPARTMENT_OTHER)
Admission: RE | Admit: 2022-01-11 | Discharge: 2022-01-11 | Disposition: A | Discharge status: Home / Self Care | Payer: PPO | Attending: Orthopedic Surgery | Admitting: Orthopedic Surgery

## 2022-01-11 ENCOUNTER — Encounter (HOSPITAL_BASED_OUTPATIENT_CLINIC_OR_DEPARTMENT_OTHER): Payer: Self-pay | Admitting: Orthopedic Surgery

## 2022-01-11 DIAGNOSIS — M2042 Other hammer toe(s) (acquired), left foot: Secondary | ICD-10-CM | POA: Diagnosis not present

## 2022-01-11 DIAGNOSIS — M25375 Other instability, left foot: Secondary | ICD-10-CM | POA: Insufficient documentation

## 2022-01-11 DIAGNOSIS — J449 Chronic obstructive pulmonary disease, unspecified: Secondary | ICD-10-CM

## 2022-01-11 DIAGNOSIS — Z87891 Personal history of nicotine dependence: Secondary | ICD-10-CM | POA: Insufficient documentation

## 2022-01-11 DIAGNOSIS — F419 Anxiety disorder, unspecified: Secondary | ICD-10-CM | POA: Diagnosis not present

## 2022-01-11 DIAGNOSIS — F319 Bipolar disorder, unspecified: Secondary | ICD-10-CM | POA: Diagnosis not present

## 2022-01-11 DIAGNOSIS — L603 Nail dystrophy: Secondary | ICD-10-CM | POA: Diagnosis not present

## 2022-01-11 DIAGNOSIS — U099 Post covid-19 condition, unspecified: Secondary | ICD-10-CM | POA: Insufficient documentation

## 2022-01-11 DIAGNOSIS — Z6836 Body mass index (BMI) 36.0-36.9, adult: Secondary | ICD-10-CM | POA: Diagnosis not present

## 2022-01-11 DIAGNOSIS — F418 Other specified anxiety disorders: Secondary | ICD-10-CM | POA: Diagnosis not present

## 2022-01-11 DIAGNOSIS — G5762 Lesion of plantar nerve, left lower limb: Secondary | ICD-10-CM | POA: Diagnosis not present

## 2022-01-11 DIAGNOSIS — E669 Obesity, unspecified: Secondary | ICD-10-CM | POA: Diagnosis not present

## 2022-01-11 DIAGNOSIS — J8410 Pulmonary fibrosis, unspecified: Secondary | ICD-10-CM | POA: Insufficient documentation

## 2022-01-11 DIAGNOSIS — M205X2 Other deformities of toe(s) (acquired), left foot: Secondary | ICD-10-CM | POA: Diagnosis not present

## 2022-01-11 DIAGNOSIS — M7742 Metatarsalgia, left foot: Secondary | ICD-10-CM | POA: Diagnosis not present

## 2022-01-11 DIAGNOSIS — G8918 Other acute postprocedural pain: Secondary | ICD-10-CM | POA: Diagnosis not present

## 2022-01-11 HISTORY — PX: WEIL OSTEOTOMY: SHX5044

## 2022-01-11 HISTORY — PX: EXCISION MORTON'S NEUROMA: SHX5013

## 2022-01-11 HISTORY — PX: HAMMER TOE SURGERY: SHX385

## 2022-01-11 HISTORY — PX: TOENAIL EXCISION: SHX183

## 2022-01-11 SURGERY — OSTEOTOMY, WEIL
Anesthesia: General | Site: Toe | Laterality: Left

## 2022-01-11 MED ORDER — OXYCODONE HCL 5 MG/5ML PO SOLN: 5.0000 mg | Freq: Once | ORAL | Status: DC | PRN

## 2022-01-11 MED ORDER — LACTATED RINGERS IV SOLN: INTRAVENOUS | Status: DC

## 2022-01-11 MED ORDER — MIDAZOLAM HCL 2 MG/2ML IJ SOLN
INTRAMUSCULAR | Status: AC
  Filled 2022-01-11: qty 2

## 2022-01-11 MED ORDER — ACETAMINOPHEN 500 MG PO TABS
1000.0000 mg | ORAL_TABLET | Freq: Once | ORAL | Status: AC
Start: 2022-01-11 — End: 2022-01-11
  Administered 2022-01-11: 1000 mg via ORAL

## 2022-01-11 MED ORDER — DEXAMETHASONE SODIUM PHOSPHATE 10 MG/ML IJ SOLN
INTRAMUSCULAR | Status: DC | PRN
  Administered 2022-01-11 (×2): 10 mg

## 2022-01-11 MED ORDER — ACETAMINOPHEN 500 MG PO TABS
ORAL_TABLET | ORAL | Status: AC
Start: 2022-01-11 — End: ?
  Filled 2022-01-11: qty 2

## 2022-01-11 MED ORDER — ROPIVACAINE HCL 5 MG/ML IJ SOLN
INTRAMUSCULAR | Status: DC | PRN
  Administered 2022-01-11: 40 mL via PERINEURAL

## 2022-01-11 MED ORDER — VANCOMYCIN HCL 500 MG IV SOLR
INTRAVENOUS | Status: AC
  Filled 2022-01-11: qty 10

## 2022-01-11 MED ORDER — ONDANSETRON HCL 4 MG/2ML IJ SOLN: 4.0000 mg | Freq: Once | INTRAMUSCULAR | Status: DC | PRN

## 2022-01-11 MED ORDER — ONDANSETRON HCL 4 MG/2ML IJ SOLN
INTRAMUSCULAR | Status: DC | PRN
  Administered 2022-01-11: 4 mg via INTRAVENOUS

## 2022-01-11 MED ORDER — PROPOFOL 10 MG/ML IV BOLUS
INTRAVENOUS | Status: DC | PRN
  Administered 2022-01-11: 150 mg via INTRAVENOUS

## 2022-01-11 MED ORDER — HYDROMORPHONE HCL 1 MG/ML IJ SOLN: 0.2500 mg | INTRAMUSCULAR | Status: DC | PRN

## 2022-01-11 MED ORDER — LIDOCAINE 2% (20 MG/ML) 5 ML SYRINGE
INTRAMUSCULAR | Status: AC
Start: 2022-01-11 — End: ?
  Filled 2022-01-11: qty 5

## 2022-01-11 MED ORDER — DEXAMETHASONE SODIUM PHOSPHATE 10 MG/ML IJ SOLN
INTRAMUSCULAR | Status: AC
  Filled 2022-01-11: qty 1

## 2022-01-11 MED ORDER — OXYCODONE HCL 5 MG PO TABS: 5.0000 mg | ORAL_TABLET | Freq: Once | ORAL | Status: DC | PRN

## 2022-01-11 MED ORDER — MIDAZOLAM HCL 2 MG/2ML IJ SOLN
1.0000 mg | Freq: Once | INTRAMUSCULAR | Status: AC
  Administered 2022-01-11: 1 mg via INTRAVENOUS

## 2022-01-11 MED ORDER — OXYCODONE HCL 5 MG PO TABS: 5.0000 mg | ORAL_TABLET | Freq: Four times a day (QID) | ORAL | 0 refills | Status: AC | PRN

## 2022-01-11 MED ORDER — SODIUM CHLORIDE 0.9 % IV SOLN: INTRAVENOUS | Status: DC

## 2022-01-11 MED ORDER — FENTANYL CITRATE (PF) 100 MCG/2ML IJ SOLN
50.0000 ug | Freq: Once | INTRAMUSCULAR | Status: AC
  Administered 2022-01-11: 50 ug via INTRAVENOUS

## 2022-01-11 MED ORDER — PHENYLEPHRINE 80 MCG/ML (10ML) SYRINGE FOR IV PUSH (FOR BLOOD PRESSURE SUPPORT)
PREFILLED_SYRINGE | INTRAVENOUS | Status: AC
  Filled 2022-01-11: qty 10

## 2022-01-11 MED ORDER — SENNA 8.6 MG PO TABS: 2.0000 | ORAL_TABLET | Freq: Two times a day (BID) | ORAL | 0 refills | Status: DC

## 2022-01-11 MED ORDER — 0.9 % SODIUM CHLORIDE (POUR BTL) OPTIME
TOPICAL | Status: DC | PRN
  Administered 2022-01-11: 120 mL

## 2022-01-11 MED ORDER — PROPOFOL 10 MG/ML IV BOLUS
INTRAVENOUS | Status: AC
Start: 2022-01-11 — End: ?
  Filled 2022-01-11: qty 20

## 2022-01-11 MED ORDER — LIDOCAINE HCL (CARDIAC) PF 100 MG/5ML IV SOSY
PREFILLED_SYRINGE | INTRAVENOUS | Status: DC | PRN
  Administered 2022-01-11: 40 mg via INTRATRACHEAL

## 2022-01-11 MED ORDER — FENTANYL CITRATE (PF) 100 MCG/2ML IJ SOLN
INTRAMUSCULAR | Status: AC
  Filled 2022-01-11: qty 2

## 2022-01-11 MED ORDER — VANCOMYCIN HCL 500 MG IV SOLR
INTRAVENOUS | Status: DC | PRN
  Administered 2022-01-11: 500 mg via TOPICAL

## 2022-01-11 MED ORDER — ONDANSETRON HCL 4 MG/2ML IJ SOLN
INTRAMUSCULAR | Status: AC
Start: 2022-01-11 — End: ?
  Filled 2022-01-11: qty 2

## 2022-01-11 MED ORDER — DOCUSATE SODIUM 100 MG PO CAPS: 100.0000 mg | ORAL_CAPSULE | Freq: Two times a day (BID) | ORAL | 0 refills | Status: DC

## 2022-01-11 MED ORDER — PROPOFOL 500 MG/50ML IV EMUL
INTRAVENOUS | Status: DC | PRN
  Administered 2022-01-11: 25 ug/kg/min via INTRAVENOUS

## 2022-01-11 MED ORDER — CEFAZOLIN SODIUM-DEXTROSE 2-4 GM/100ML-% IV SOLN
2.0000 g | INTRAVENOUS | Status: AC
  Administered 2022-01-11: 2 g via INTRAVENOUS

## 2022-01-11 MED ORDER — AMISULPRIDE (ANTIEMETIC) 5 MG/2ML IV SOLN: 10.0000 mg | Freq: Once | INTRAVENOUS | Status: DC | PRN

## 2022-01-11 SURGICAL SUPPLY — 81 items
APL PRP STRL LF DISP 70% ISPRP (MISCELLANEOUS) ×2
BANDAGE ESMARK 6X9 LF (GAUZE/BANDAGES/DRESSINGS) IMPLANT
BIT DRILL 1.8 CANN MAX VPC (BIT) ×1 IMPLANT
BLADE AVERAGE 25X9 (BLADE) IMPLANT
BLADE LONG MED 25X9 (BLADE) ×4 IMPLANT
BLADE MINI RND TIP GREEN BEAV (BLADE) ×1 IMPLANT
BLADE OSC/SAG .038X5.5 CUT EDG (BLADE) IMPLANT
BLADE SURG 15 STRL LF DISP TIS (BLADE) ×6 IMPLANT
BLADE SURG 15 STRL SS (BLADE) ×9
BNDG CMPR 75X21 PLY HI ABS (MISCELLANEOUS)
BNDG CMPR 9X4 STRL LF SNTH (GAUZE/BANDAGES/DRESSINGS)
BNDG CMPR 9X6 STRL LF SNTH (GAUZE/BANDAGES/DRESSINGS)
BNDG ELASTIC 4X5.8 VLCR STR LF (GAUZE/BANDAGES/DRESSINGS) ×4 IMPLANT
BNDG ESMARK 4X9 LF (GAUZE/BANDAGES/DRESSINGS) IMPLANT
BNDG ESMARK 6X9 LF (GAUZE/BANDAGES/DRESSINGS)
BNDG GZE 12X3 1 PLY HI ABS (GAUZE/BANDAGES/DRESSINGS) ×2
BNDG STRETCH GAUZE 3IN X12FT (GAUZE/BANDAGES/DRESSINGS) ×4 IMPLANT
CHLORAPREP W/TINT 26 (MISCELLANEOUS) ×4 IMPLANT
CORD BIPOLAR FORCEPS 12FT (ELECTRODE) ×4 IMPLANT
COVER BACK TABLE 60X90IN (DRAPES) ×4 IMPLANT
COVER SURGICAL LIGHT HANDLE (MISCELLANEOUS) ×1 IMPLANT
CUFF TOURN SGL QUICK 18X4 (TOURNIQUET CUFF) IMPLANT
CUFF TOURN SGL QUICK 24 (TOURNIQUET CUFF)
CUFF TOURN SGL QUICK 34 (TOURNIQUET CUFF)
CUFF TRNQT CYL 24X4X16.5-23 (TOURNIQUET CUFF) IMPLANT
CUFF TRNQT CYL 34X4.125X (TOURNIQUET CUFF) IMPLANT
DRAPE EXTREMITY T 121X128X90 (DISPOSABLE) ×4 IMPLANT
DRAPE OEC MINIVIEW 54X84 (DRAPES) ×4 IMPLANT
DRAPE U-SHAPE 47X51 STRL (DRAPES) ×4 IMPLANT
DRSG MEPITEL 4X7.2 (GAUZE/BANDAGES/DRESSINGS) ×4 IMPLANT
DRSG PAD ABDOMINAL 8X10 ST (GAUZE/BANDAGES/DRESSINGS) ×1 IMPLANT
ELECT REM PT RETURN 9FT ADLT (ELECTROSURGICAL) ×3
ELECTRODE REM PT RTRN 9FT ADLT (ELECTROSURGICAL) ×3 IMPLANT
GAUZE SPONGE 4X4 12PLY STRL (GAUZE/BANDAGES/DRESSINGS) ×4 IMPLANT
GAUZE STRETCH 2X75IN STRL (MISCELLANEOUS) ×3 IMPLANT
GLOVE BIO SURGEON STRL SZ8 (GLOVE) ×4 IMPLANT
GLOVE BIOGEL PI IND STRL 7.0 (GLOVE) IMPLANT
GLOVE BIOGEL PI IND STRL 8 (GLOVE) ×6 IMPLANT
GLOVE BIOGEL PI INDICATOR 7.0 (GLOVE) ×2
GLOVE BIOGEL PI INDICATOR 8 (GLOVE) ×2
GLOVE ECLIPSE 8.0 STRL XLNG CF (GLOVE) ×5 IMPLANT
GLOVE SURG SS PI 7.0 STRL IVOR (GLOVE) ×1 IMPLANT
GOWN STRL REUS W/ TWL LRG LVL3 (GOWN DISPOSABLE) ×3 IMPLANT
GOWN STRL REUS W/ TWL XL LVL3 (GOWN DISPOSABLE) ×6 IMPLANT
GOWN STRL REUS W/TWL LRG LVL3 (GOWN DISPOSABLE) ×3
GOWN STRL REUS W/TWL XL LVL3 (GOWN DISPOSABLE) ×6
K-WIRE COCR 0.9X95 (WIRE) ×3
K-WIRE DBL .054X4 NSTRL (WIRE)
KWIRE COCR 0.9X95 (WIRE) IMPLANT
KWIRE DBL .054X4 NSTRL (WIRE) IMPLANT
NDL HYPO 25X1 1.5 SAFETY (NEEDLE) IMPLANT
NEEDLE HYPO 22GX1.5 SAFETY (NEEDLE) IMPLANT
NEEDLE HYPO 25X1 1.5 SAFETY (NEEDLE) IMPLANT
NS IRRIG 1000ML POUR BTL (IV SOLUTION) ×4 IMPLANT
PACK BASIN DAY SURGERY FS (CUSTOM PROCEDURE TRAY) ×4 IMPLANT
PAD CAST 4YDX4 CTTN HI CHSV (CAST SUPPLIES) ×3 IMPLANT
PADDING CAST ABS 4INX4YD NS (CAST SUPPLIES)
PADDING CAST ABS COTTON 4X4 ST (CAST SUPPLIES) IMPLANT
PADDING CAST COTTON 4X4 STRL (CAST SUPPLIES) ×3
PENCIL SMOKE EVACUATOR (MISCELLANEOUS) ×4 IMPLANT
SANITIZER HAND PURELL 535ML FO (MISCELLANEOUS) ×4 IMPLANT
SCREW HCS TWIST-OFF 2.0X12MM (Screw) ×3 IMPLANT
SCREW VPC 2.5X16MM (Screw) ×2 IMPLANT
SHEET MEDIUM DRAPE 40X70 STRL (DRAPES) ×4 IMPLANT
SLEEVE SCD COMPRESS KNEE MED (STOCKING) ×4 IMPLANT
SPONGE T-LAP 18X18 ~~LOC~~+RFID (SPONGE) ×4 IMPLANT
STOCKINETTE 6  STRL (DRAPES) ×3
STOCKINETTE 6 STRL (DRAPES) ×3 IMPLANT
SUCTION FRAZIER HANDLE 10FR (MISCELLANEOUS) ×3
SUCTION TUBE FRAZIER 10FR DISP (MISCELLANEOUS) ×3 IMPLANT
SUT ETHILON 3 0 PS 1 (SUTURE) ×5 IMPLANT
SUT MNCRL AB 3-0 PS2 18 (SUTURE) ×4 IMPLANT
SUT VIC AB 2-0 SH 27 (SUTURE) ×3
SUT VIC AB 2-0 SH 27XBRD (SUTURE) IMPLANT
SUT VICRYL 0 UR6 27IN ABS (SUTURE) ×2 IMPLANT
SYR BULB EAR ULCER 3OZ GRN STR (SYRINGE) ×4 IMPLANT
SYR CONTROL 10ML LL (SYRINGE) IMPLANT
TOWEL GREEN STERILE FF (TOWEL DISPOSABLE) ×4 IMPLANT
TUBE CONNECTING 20X1/4 (TUBING) ×1 IMPLANT
UNDERPAD 30X36 HEAVY ABSORB (UNDERPADS AND DIAPERS) ×4 IMPLANT
YANKAUER SUCT BULB TIP NO VENT (SUCTIONS) IMPLANT

## 2022-01-11 NOTE — Transfer of Care (Signed)
Immediate Anesthesia Transfer of Care Note  Patient: Brittany [name]  Procedure(s) Performed: SECOND, THIRD AND FOURTH WEIL OSTEOTOMIES (Left: Foot) COLLATERAL LIGAMENT REPAIRS WITH SECOND AND THIRD HAMMER TOE CORRECTION (Left: Toe) SECOND WEBSPACE MORTON'S NEUROMA EXCISION (Left: Foot) HALLUX TOENAIL PERMANENT EXCISION (Left: Toe)  Patient Location: PACU  Anesthesia Type:General  Level of Consciousness: drowsy and patient cooperative  Airway & Oxygen Therapy: Patient Spontanous Breathing and Patient connected to face mask oxygen  Post-op Assessment: Report given to RN and Post -op Vital signs reviewed and stable  Post vital signs: Reviewed and stable  Last Vitals:  Vitals Value Taken Time  BP 114/68 01/11/22 1021  Temp    Pulse 89 01/11/22 1022  Resp 12 01/11/22 1022  SpO2 98 % 01/11/22 1022  Vitals shown include unvalidated device data.  Last Pain:  Vitals:   01/11/22 0814  TempSrc: Oral  PainSc: 2          Complications: No notable events documented.

## 2022-01-11 NOTE — H&P (Signed)
Brittany [name] is an 66 y.o. female.   Chief Complaint: Left forefoot pain HPI: 65 year old female without significant past medical history complains of a long history of left forefoot pain due to metatarsalgia and hammertoe deformities.  She also complains of pain at her hallux toenail.  She has failed nonoperative treatment to date including activity modification, oral anti-inflammatories and shoewear modification.  She presents today for surgical treatment of her painful forefoot deformities.  Past Medical History:  Diagnosis Date   Abnormal EKG    Allergy    Anxiety    Asthma    Bipolar depression ([place]), Anxiety, PTSD, Panic disorder    -managed by [facility]   Depression    Foot pain    GERD (gastroesophageal reflux disease)    Heart murmur    as a child   Insomnia    Leg swelling    Osteoporosis    osteopenia   Pneumonia due to COVID-19 virus 04/22/2019   Tachycardia     Past Surgical History:  Procedure Laterality Date   BREAST BIOPSY Right    biopsy of nipple   CARPAL TUNNEL RELEASE Left    CESAREAN SECTION     x 2   CHOLECYSTECTOMY     COLONOSCOPY     ENDOMETRIAL ABLATION     KNEE SURGERY Right    reconstruction for patellar dislocation   LAPAROSCOPY     x 2   TUBAL LIGATION     ULNAR NERVE REPAIR Left    UPPER GASTROINTESTINAL ENDOSCOPY      Family History  Problem Relation Age of Onset   Heart failure Mother    Hypertension Mother    Hyperlipidemia Mother    Congestive Heart Failure Mother    Other Father        killed   COPD Paternal 5        a lot of aunts and uncle COPD or Emphysema   Emphysema Paternal Uncle    Colon polyps Maternal Aunt    COPD Brother    Heart disease Brother    Pulmonary embolism Brother    Breast cancer Neg Hx    Colon cancer Neg Hx    Esophageal cancer Neg Hx    Pancreatic cancer Neg Hx    Stomach cancer Neg Hx    Liver disease Neg Hx    Rectal cancer Neg Hx    Social History:  reports that she quit  smoking about 22 years ago. Her smoking use included cigarettes. She has a 20.00 pack-year smoking history. She has never used smokeless tobacco. She reports current alcohol use. She reports that she does not use drugs.  Allergies:  Allergies  Allergen Reactions   Aspirin Other (See Comments)    discomfort   Atorvastatin     Patient states she felt suicidal   Codeine Itching   Crestor [Rosuvastatin]     Dizzy, leg pain and states she felt suicidal   Gabapentin    Niacin And Related Itching   Sulfa Antibiotics     Other reaction(s): Unknown   Sulfamethoxazole-Trimethoprim Other (See Comments)    Unknown reaction per pt   Topiramate Other (See Comments)    Per patient, it causes stomach burning   Trileptal [Oxcarbazepine]     itching   Trimethoprim Other (See Comments)    Unknown, per pt   Ultram [Tramadol] Itching   Vioxx [Rofecoxib]     Unknown per pt    Facility-Administered Medications Prior to Admission  Medication Dose Route Frequency Provider Last Rate Last Admin   0.9 %  sodium chloride infusion  500 mL Intravenous Once Thornton Park, MD       0.9 %  sodium chloride infusion  500 mL Intravenous Once Thornton Park, MD       Medications Prior to Admission  Medication Sig Dispense Refill   albuterol (PROVENTIL) (2.5 MG/3ML) 0.083% nebulizer solution Take 3 mLs (2.5 mg total) by nebulization every 6 (six) hours as needed for wheezing or shortness of breath. 75 mL 12   Albuterol Sulfate (PROAIR RESPICLICK) 950 (90 Base) MCG/ACT AEPB Inhale 2 puffs into the lungs every 6 (six) hours as needed. 1 each 6   Azelastine HCl 137 MCG/SPRAY SOLN SPRAY ONE SPRAY IN EACH NOSTRIL TWICE DAILY AS DIRECTED 30 mL 1   b complex vitamins capsule Take 1 capsule by mouth daily.     budesonide-formoterol (SYMBICORT) 160-4.5 MCG/ACT inhaler Inhale 2 puffs into the lungs 2 (two) times daily. 1 each 11   busPIRone (BUSPAR) 15 MG tablet Take 1 tablet (15 mg total) by mouth 3 (three) times  daily. 270 tablet 1   cetirizine (ZYRTEC) 10 MG tablet Take 1 tablet (10 mg total) by mouth daily. 90 tablet 1   Cholecalciferol (VITAMIN D-3 PO) Take 10,000 Units by mouth daily.      colestipol (COLESTID) 1 g tablet Take 2 tablets (2 g total) by mouth 2 (two) times daily. 120 tablet 2   COLLAGEN PO Take by mouth.     diazepam (VALIUM) 5 MG tablet TAKE ONE TABLET BY MOUTH EVERY 6 HOURS AS NEEDED FOR ANXIETY 120 tablet 1   dicyclomine (BENTYL) 20 MG tablet TAKE ONE TABLET BY MOUTH FOUR TIMES A DAY AS NEEDED FOR SPASMS 360 tablet 3   doxazosin (CARDURA) 4 MG tablet TAKE TWO TABLETS BY MOUTH EVERY NIGHT AT BEDTIME 180 tablet 0   famotidine (PEPCID) 20 MG tablet Take 1 tablet (20 mg total) by mouth 2 (two) times daily. (Patient taking differently: Take 20 mg by mouth as needed.) 180 tablet 3   ferrous sulfate 325 (65 FE) MG EC tablet Take 325 mg by mouth 2 (two) times a week.     fluticasone (FLONASE) 50 MCG/ACT nasal spray SPRAY TWO SPRAYS IN EACH NOSTRIL TWICE DAILY 96 mL 2   HYDROcodone-acetaminophen (NORCO/VICODIN) 5-325 MG tablet Take 1 tablet by mouth every 6 (six) hours as needed.     MAGNESIUM PO Calcium citrate, magnesium and vitamin D3 combo     mirtazapine (REMERON) 30 MG tablet TAKE ONE TABLET BY MOUTH AT BEDTIME 90 tablet 1   montelukast (SINGULAIR) 10 MG tablet TAKE ONE TABLET BY MOUTH EVERY NIGHT AT BEDTIME 90 tablet 2   Multiple Vitamin (MULTIVITAMIN WITH MINERALS) TABS tablet Take 1 tablet by mouth daily.     pantoprazole (PROTONIX) 40 MG tablet TAKE ONE TABLET BY MOUTH TWICE A DAY 180 tablet 3   sodium chloride (OCEAN) 0.65 % SOLN nasal spray Place 1 spray into both nostrils as needed for congestion.      No results found for this or any previous visit (from the past 48 hour(s)). No results found.  Review of Systems no recent fever, chills, nausea, vomiting or changes in her appetite  Blood pressure 127/78, pulse 84, temperature 98.2 F (36.8 C), temperature source Oral,  resp. rate 13, height '5\' 1"'$  (1.549 m), weight 85.5 kg, SpO2 95 %. Physical Exam  Well-nourished well-developed woman in no apparent distress.  Alert and  oriented x4.  Normal mood and affect.  Gait is normal.  The left foot has second, third and fourth hammertoe deformities.  There is instability at the MTP joints.  She has a thickened deformed hallux toenail.  Pulses are palpable in the foot.  No lymphadenopathy.  Intact sensibility to light touch dorsally and plantarly at the forefoot.   Assessment/Plan Left second through fourth hammertoe deformities with MTP joint instability and metatarsalgia.  Hallux dystrophic toenail -to the operating room today for 2 through 4 metatarsal Weil osteotomies and collateral ligament repairs with hammertoe corrections and excision of the hallux toenail.  The risks and benefits of the alternative treatment options have been discussed in detail.  The patient wishes to proceed with surgery and specifically understands risks of bleeding, infection, nerve damage, blood clots, need for additional surgery, amputation and death.   Wylene Simmer, MD 01-24-2022, 8:44 AM

## 2022-01-11 NOTE — Anesthesia Postprocedure Evaluation (Signed)
Anesthesia Post Note  Patient: Brittany [name]  Procedure(s) Performed: SECOND, THIRD AND FOURTH WEIL OSTEOTOMIES (Left: Foot) COLLATERAL LIGAMENT REPAIRS WITH SECOND AND THIRD HAMMER TOE CORRECTION (Left: Toe) SECOND WEBSPACE MORTON'S NEUROMA EXCISION (Left: Foot) HALLUX TOENAIL PERMANENT EXCISION (Left: Toe)     Patient location during evaluation: PACU Anesthesia Type: General and Regional Level of consciousness: awake and alert, oriented and patient cooperative Pain management: pain level controlled Vital Signs Assessment: post-procedure vital signs reviewed and stable Respiratory status: spontaneous breathing, nonlabored ventilation and respiratory function stable Cardiovascular status: blood pressure returned to baseline and stable Postop Assessment: no apparent nausea or vomiting Anesthetic complications: no   No notable events documented.  Last Vitals:  Vitals:   01/11/22 1045 01/11/22 1100  BP: 119/70 (!) 123/53  Pulse: 83 91  Resp: 13 18  Temp:  (!) 36.3 C  SpO2: 96% 94%    Last Pain:  Vitals:   01/11/22 1100  TempSrc: Oral  PainSc: 0-No pain                 Pervis Hocking

## 2022-01-11 NOTE — Anesthesia Procedure Notes (Signed)
Procedure Name: LMA Insertion Date/Time: 01/11/2022 9:11 AM  Performed by: Glory Buff, CRNAPre-anesthesia Checklist: Patient identified, Emergency Drugs available, Suction available and Patient being monitored Patient Re-evaluated:Patient Re-evaluated prior to induction Oxygen Delivery Method: Circle system utilized Preoxygenation: Pre-oxygenation with 100% oxygen Induction Type: IV induction LMA: LMA inserted LMA Size: 4.0 Number of attempts: 1 Placement Confirmation: positive ETCO2 Tube secured with: Tape Dental Injury: Teeth and Oropharynx as per pre-operative assessment

## 2022-01-11 NOTE — Op Note (Signed)
01/11/2022  10:24 AM  PATIENT:  Brittany [name]  66 y.o. female  PRE-OPERATIVE DIAGNOSIS: 1.  Left 2-4 hammertoes      2.  Left 2nd webspace Morton's neuroma      3.  Left forefoot metatarsalgia with MTP joint instability      4.  Left hallux dystrophic nail  POST-OPERATIVE DIAGNOSIS:  same  Procedure(s): 1.  Left second webspace Morton's neuroma excision 2.  Left second, third and fourth metatarsal Weil osteotomies through separate incisions 3.  Left second, third and fourth MTP joint lateral collateral ligament repairs 4.  Left second and third hammertoe corrections 5.  Excision of left hallux dystrophic toenail and matrixectomy 6.  Left foot AP, lateral and oblique radiographs  SURGEON:  Wylene Simmer, MD  ASSISTANT: Mechele Claude, PA-C  ANESTHESIA:   General, regional  EBL:  minimal   TOURNIQUET: Less than 1 hour at 267 mmHg  COMPLICATIONS:  None apparent  DISPOSITION:  Extubated, awake and stable to recovery.  INDICATION FOR PROCEDURE: 66 year old female with complicated past medical history complains of a long history of left forefoot pain due to hammertoe deformities, metatarsalgia, second webspace Morton's neuroma and dystrophic hallux toenail.  She has failed nonoperative treatment including activity modification, oral anti-inflammatories and shoewear modification.  She presents today for left forefoot reconstruction.  The risks and benefits of the alternative treatment options have been discussed in detail.  The patient wishes to proceed with surgery and specifically understands risks of bleeding, infection, nerve damage, blood clots, need for additional surgery, amputation and death.   PROCEDURE IN DETAIL:  After pre operative consent was obtained, and the correct operative site was identified, the patient was brought to the operating room and placed supine on the OR table.  Anesthesia was administered.  Pre-operative antibiotics were administered.  A surgical timeout was  taken.  The left lower extremity was prepped and draped in standard sterile fashion with a tourniquet around the thigh.  The extremity was elevated, and the tourniquet was inflated to 250 mmHg.  A longitudinal incision was made at the second webspace.  Dissection was carried sharply down through the subcutaneous tissues.  The intermetatarsal ligament was identified.  It was divided under direct vision.  The interdigital nerve was identified.  It was transected proximally at the level of the interossei.  It was traced distally past the bifurcation and transected at each digital nerve.  Attention was turned to the second MTP joint.  The dorsal joint capsule was incised.  The medial collateral ligament was released.  A Weil osteotomy was made with the oscillating saw.  The head of the metatarsal was allowed to retract proximally and was fixed with a 2 mm [employer] FRS screw.  Overhanging bone was trimmed with a rondure.  A box suture of 0 Vicryl was placed in the lateral collateral ligament and tagged for later tying.  Attention was then turned to the third MTP joint where the same procedure was performed including the Weil osteotomy and lateral collateral ligament repair.  An incision was then made lateral to the fourth MTP joint.  The same 2 procedures were performed for the fourth MTP joint.  Attention was turned to the second toe where a transverse incision was made over the PIP joint.  Dissection was carried sharply down through the subcutaneous tissues and extensor mechanism.  The head of the proximal phalanx was resected with the oscillating saw.  The base of the middle phalanx was resected with a  rondure.  The joint was reduced and fixed with a 2.5 mm Zimmer [employer] VPC screw.  Same procedure was performed for the third toe.  The fourth toe was passively correctable after the Weil osteotomy and did not require PIP joint arthrodesis.  Attention was returned to the MTP joints.  The fourth MTP joint was  reduced and the lateral collateral ligament suture tied securely.  This was repeated for the third MTP joint followed by the second.  AP, lateral and oblique radiographs confirmed appropriate reduction of the MTP joints in appropriate position and length of all hardware.  The wounds were irrigated copiously and sprinkled with vancomycin powder.  Incisions were all closed with horizontal mattress sutures of 3-0 nylon.  Attention was turned to the hallux.  Corner incisions were made at the eponychial fold.  The proximal fold was elevated.  The nail was then elevated from the nailbed and removed in its entirety.  The germinal matrix was sharply excised.  The wound was irrigated.  The incisions were closed with 3-0 Monocryl approximating the eponychial folds to the remaining nailbed.  Sterile dressings were applied followed by a well-padded compression wrap with the toes splinted laterally.  The tourniquet was released after application of the dressings.  The patient was awakened from anesthesia and transported to the recovery room in stable condition.  FOLLOW UP PLAN: Weightbearing as tolerated in a flat postop shoe.  Follow-up in the office in 2 weeks for suture removal and conversion to a toe spacer.  Toe taping may be necessary based on evident stability at that time.  No indication for DVT prophylaxis in this ambulatory patient.   RADIOGRAPHS: AP, lateral and oblique radiographs are obtained of the left foot intraoperatively.  These show interval shortening of the second, third and fourth metatarsals and arthrodesis of the second and third toe PIP joints.  Hardware is appropriately positioned and of the appropriate lengths.  Toes are appropriately aligned through the MTP joints.    Mechele Claude PA-C was present and scrubbed for the duration of the operative case. His assistance was essential in positioning the patient, prepping and draping, gaining and maintaining exposure, performing the operation,  closing and dressing the wounds and applying the splint.

## 2022-01-11 NOTE — Anesthesia Procedure Notes (Addendum)
  Anesthesia Regional Block: Popliteal block   Pre-Anesthetic Checklist: , timeout performed,  Correct Patient, Correct Site, Correct Laterality,  Correct Procedure, Correct Position, site marked,  Risks and benefits discussed,  Surgical consent,  Pre-op evaluation,  At surgeon's request and post-op pain management  Laterality: Left  Prep: Maximum Sterile Barrier Precautions used, chloraprep       Needles:  Injection technique: Single-shot  Needle Type: Echogenic Stimulator Needle     Needle Length: 9cm  Needle Gauge: 22     Additional Needles:   Procedures:,,,, ultrasound used (permanent image in chart),,    Narrative:  Start time: 01/11/2022 8:50 AM End time: 01/11/2022 8:53 AM Injection made incrementally with aspirations every 5 mL.  Performed by: Personally  Anesthesiologist: Pervis Hocking, DO  Additional Notes: Monitors applied. No increased pain on injection. No increased resistance to injection. Injection made in 5cc increments. Good needle visualization. Patient tolerated procedure well.

## 2022-01-11 NOTE — Anesthesia Preprocedure Evaluation (Addendum)
Anesthesia Evaluation  Patient identified by MRN, date of birth, ID band Patient awake    Reviewed: Allergy & Precautions, NPO status , Patient's Chart, lab work & pertinent test results  Airway Mallampati: III  TM Distance: >3 FB Neck ROM: Full    Dental  (+) Dental Advisory Given   Pulmonary asthma , COPD,  COPD inhaler, former smoker,  Quit smoking 2001, 20 pack year history  Uses nebulizer 1-2x/wk Has residual COVID pulmonary fibrosis No home O2   Pulmonary exam normal breath sounds clear to auscultation       Cardiovascular negative cardio ROS Normal cardiovascular exam Rhythm:Regular Rate:Normal     Neuro/Psych PSYCHIATRIC DISORDERS Anxiety Depression Bipolar Disorder negative neurological ROS     GI/Hepatic Neg liver ROS, GERD  Controlled,  Endo/Other  Obesity BMI 36  Renal/GU negative Renal ROS  negative genitourinary   Musculoskeletal L foot metatarsalgia    Abdominal (+) + obese,   Peds  Hematology negative hematology ROS (+)   Anesthesia Other Findings   Reproductive/Obstetrics negative OB ROS                           Anesthesia Physical Anesthesia Plan  ASA: 3  Anesthesia Plan: General and Regional   Post-op Pain Management: Tylenol PO (pre-op)* and Regional block*   Induction: Intravenous  PONV Risk Score and Plan: 3 and Ondansetron, Dexamethasone, Midazolam and Treatment may vary due to age or medical condition  Airway Management Planned: LMA  Additional Equipment: None  Intra-op Plan:   Post-operative Plan: Extubation in OR  Informed Consent: I have reviewed the patients History and Physical, chart, labs and discussed the procedure including the risks, benefits and alternatives for the proposed anesthesia with the patient or authorized representative who has indicated his/her understanding and acceptance.     Dental advisory given  Plan Discussed with:  CRNA  Anesthesia Plan Comments:       Anesthesia Quick Evaluation

## 2022-01-11 NOTE — Anesthesia Procedure Notes (Signed)
Anesthesia Regional Block: Adductor canal block   Pre-Anesthetic Checklist: , timeout performed,  Correct Patient, Correct Site, Correct Laterality,  Correct Procedure, Correct Position, site marked,  Risks and benefits discussed,  Surgical consent,  Pre-op evaluation,  At surgeon's request and post-op pain management  Laterality: Left  Prep: Maximum Sterile Barrier Precautions used, chloraprep       Needles:  Injection technique: Single-shot  Needle Type: Echogenic Stimulator Needle     Needle Length: 9cm  Needle Gauge: 22     Additional Needles:   Procedures:,,,, ultrasound used (permanent image in chart),,    Narrative:  Start time: 01/11/2022 8:53 AM End time: 01/11/2022 8:55 AM Injection made incrementally with aspirations every 5 mL.  Performed by: Personally  Anesthesiologist: Pervis Hocking, DO  Additional Notes: Monitors applied. No increased pain on injection. No increased resistance to injection. Injection made in 5cc increments. Good needle visualization. Patient tolerated procedure well.

## 2022-01-11 NOTE — Discharge Instructions (Addendum)
Brittany Simmer, MD [facility]  Please read the following information regarding your care after surgery.  Medications  You only need a prescription for the narcotic pain medicine (ex. oxycodone, Percocet, Norco).  All of the other medicines listed below are available over the counter. ? Aleve 2 pills twice a day for the first 3 days after surgery. ? acetominophen (Tylenol) 650 mg every 4-6 hours as you need for minor to moderate pain ? oxycodone as prescribed for severe pain  Narcotic pain medicine (ex. oxycodone, Percocet, Vicodin) will cause constipation.  To prevent this problem, take the following medicines while you are taking any pain medicine. ? docusate sodium (Colace) 100 mg twice a day ? senna (Senokot) 2 tablets twice a day  Weight Bearing ? Bear weight only on your operated foot in the post-op shoe.   Cast / Splint / Dressing ? Keep your splint, cast or dressing clean and dry.  Don't put anything (coat hanger, pencil, etc) down inside of it.  If it gets damp, use a hair dryer on the cool setting to dry it.  If it gets soaked, call the office to schedule an appointment for a cast change.   After your dressing, cast or splint is removed; you may shower, but do not soak or scrub the wound.  Allow the water to run over it, and then gently pat it dry.  Swelling It is normal for you to have swelling where you had surgery.  To reduce swelling and pain, keep your toes above your nose for at least 3 days after surgery.  It may be necessary to keep your foot or leg elevated for several weeks.  If it hurts, it should be elevated.  Follow Up Call my office at [phone] when you are discharged from the hospital or surgery center to schedule an appointment to be seen two weeks after surgery.  Call my office at [phone] if you develop a fever >101.5 F, nausea, vomiting, bleeding from the surgical site or severe pain.    No Tylenol until after 2:15pm today, if needed.   Post  Anesthesia Home Care Instructions  Activity: Get plenty of rest for the remainder of the day. A responsible individual must stay with you for 24 hours following the procedure.  For the next 24 hours, DO NOT: -Drive a car -Paediatric nurse -Drink alcoholic beverages -Take any medication unless instructed by your physician -Make any legal decisions or sign important papers.  Meals: Start with liquid foods such as gelatin or soup. Progress to regular foods as tolerated. Avoid greasy, spicy, heavy foods. If nausea and/or vomiting occur, drink only clear liquids until the nausea and/or vomiting subsides. Call your physician if vomiting continues.  Special Instructions/Symptoms: Your throat may feel dry or sore from the anesthesia or the breathing tube placed in your throat during surgery. If this causes discomfort, gargle with warm salt water. The discomfort should disappear within 24 hours.  If you had a scopolamine patch placed behind your ear for the management of post- operative nausea and/or vomiting:  1. The medication in the patch is effective for 72 hours, after which it should be removed.  Wrap patch in a tissue and discard in the trash. Wash hands thoroughly with soap and water. 2. You may remove the patch earlier than 72 hours if you experience unpleasant side effects which may include dry mouth, dizziness or visual disturbances. 3. Avoid touching the patch. Wash your hands with soap and water after contact with the  patch.    Regional Anesthesia Blocks  1. Numbness or the inability to move the "blocked" extremity may last from 3-48 hours after placement. The length of time depends on the medication injected and your individual response to the medication. If the numbness is not going away after 48 hours, call your surgeon.  2. The extremity that is blocked will need to be protected until the numbness is gone and the  Strength has returned. Because you cannot feel it, you will need to  take extra care to avoid injury. Because it may be weak, you may have difficulty moving it or using it. You may not know what position it is in without looking at it while the block is in effect.  3. For blocks in the legs and feet, returning to weight bearing and walking needs to be done carefully. You will need to wait until the numbness is entirely gone and the strength has returned. You should be able to move your leg and foot normally before you try and bear weight or walk. You will need someone to be with you when you first try to ensure you do not fall and possibly risk injury.  4. Bruising and tenderness at the needle site are common side effects and will resolve in a few days.  5. Persistent numbness or new problems with movement should be communicated to the surgeon or the [place] [phone] [place] [phone]).

## 2022-01-12 ENCOUNTER — Encounter (HOSPITAL_BASED_OUTPATIENT_CLINIC_OR_DEPARTMENT_OTHER): Payer: Self-pay | Admitting: Orthopedic Surgery

## 2022-01-23 ENCOUNTER — Telehealth: Payer: Self-pay | Admitting: Pharmacist

## 2022-01-23 NOTE — Chronic Care Management (AMB) (Signed)
    Chronic Care Management Pharmacy Assistant   Name: SAYDIE GERDTS  MRN: [id number] DOB: Dec 04, 1955  01/24/2022 [place], No answer, left message of appointment on 01/24/2022 at 11:00 via telephone visit with Jeni Salles, Pharm D. Notified to have all medications, supplements, blood pressure and/or blood sugar logs available during appointment and to return call if need to reschedule.  Care Gaps: AWV - scheduled 04/18/2022 Last BP - 102/72 on 01/08/2022 Last A1C - 5.1 on 01/08/2022 Flu - due Covid booster - postponed  Star Rating Drug: None  Any gaps in medications fill history? No  [facility]  Catering manager [phone]

## 2022-01-24 ENCOUNTER — Telehealth: Payer: Self-pay | Admitting: *Deleted

## 2022-01-24 ENCOUNTER — Ambulatory Visit: Payer: PPO | Admitting: Pharmacist

## 2022-01-24 DIAGNOSIS — E782 Mixed hyperlipidemia: Secondary | ICD-10-CM

## 2022-01-24 DIAGNOSIS — M8580 Other specified disorders of bone density and structure, unspecified site: Secondary | ICD-10-CM

## 2022-01-24 NOTE — Telephone Encounter (Signed)
-----   Message from Farrel Conners, MD sent at 01/23/2022  4:32 PM EDT ----- Regarding: FW: CCM referral Can you put in a CCM referral for this patient? Thanks! ----- Message ----- From: Viona Gilmore, [facility] Sent: 01/23/2022   8:50 AM EDT To: Farrel Conners, MD Subject: CCM referral                                   Hi,  I have Ms. Hassell Done on my schedule tomorrow for a follow up telephone call and unfortunately with [facility], they require that the current PCP puts in a CCM referral in order for me to see the patients (even though the previous PCP put one in already). I will try to send these gradually but I apologize in advance if you see a lot of messages from me about this. Can you possibly put in a referral for her? The code is [id number] and you have to select pharmacy. I would be happy to walk you through how to put it in. If you would prefer for me to send these to your CMA, I am happy to do that as well.  Just let me know either way! And I am waiting to request these from you until after you have a TOC visit of course.  Thanks, Maddie

## 2022-01-24 NOTE — Progress Notes (Signed)
Chronic Care Management Pharmacy Note  02/05/2022 Name:  Brittany [name] MRN:  [id number] DOB:  Jan 16, 1956  Summary: LDL not at goal < 100 but pt has stopped medications since Pt had serious side effects with statin and does not wish to try again Pt has concerns about cost of some medications  Recommendations/Changes made from today's visit: -Recommend Zetia 10 mg daily for LDL lowering -Recommended against red yeast rice supplementation due to safety and efficacy -Recommended switching colestipol to colesevelam due to cost  Plan: Follow up after PCP visit   Subjective: Brittany [name] is an 66 y.o. year old female who is a primary patient of Brittany [name], Brittany Cowper, MD.  The CCM team was consulted for assistance with disease management and care coordination needs.    Engaged with patient by telephone for follow up visit in response to provider referral for pharmacy case management and/or care coordination services.   Consent to Services:  The patient was given information about Chronic Care Management services, agreed to services, and gave verbal consent prior to initiation of services.  Please see initial visit note for detailed documentation.   Patient Care Team: Brittany Conners, MD as PCP - General (Family Medicine) Brittany Hector, MD as PCP - Cardiology (Cardiology) Brittany [name], [facility] as Pharmacist (Pharmacist)  Recent office visits: 01/08/22 Brittany Freshwater, MD: Patient presented to establish care. Follow up in 3 months for fatigue.  Recent consult visits: 01/12/22 Brittany Claude, PA-C ([facility]): Patient presented for hammer toe post op visit.  11/17/21 Brittany Bame, NP (cardiology): Patient presented for CAD and elevated CAC score. Recommended repeat lipid panel in 3 months.  11/15/21 Brittany Jackson, MD (pulmonary): Patient presented for asthma follow up.  09/27/21 Brittany Slade, MD (psychiatry): Patient presented for PSTD and bipolar follow up. No medication  changes. Follow up in 3-4 months.  09/04/2021 Brittany Bame NP (heartcare) - Patient was seen for CAD in native artery and additional issues. Started atorvastatin 20 mg daily to increase to 40 mg daily if tolerated on 09/25/2021. Follow up at scheduled.    08/02/2021 Brittany [name] (orthopedic) - Patient was seen for vertebrogenic low back pain. No other chart notes.   07/23/2021 Brittany [name] (orthopedic) - Patient was seen for vertebrogenic low back pain. No other chart notes.     10/04/2020 Brittany Park MD (gastroenterology) - Patient was seen for frequent stools. Started Rifaximin 550 mg, changed Colestipol to 2gm bid, discontinued Topiramate. No follow up noted.  Hospital visits: 01/11/22 Patient admitted to [facility] for 4 hours for second, third and fourth weil osteotomies.   Objective:  Lab Results  Component Value Date   CREATININE 0.75 11/17/2021   BUN 16 11/17/2021   GFR 91.52 05/10/2021   GFRNONAA >60 05/01/2019   GFRAA >60 05/01/2019   NA 141 11/17/2021   K 4.4 11/17/2021   CALCIUM 9.7 11/17/2021   CO2 25 11/17/2021   GLUCOSE 94 11/17/2021    Lab Results  Component Value Date/Time   HGBA1C 5.1 01/08/2022 11:43 AM   HGBA1C 4.7 04/06/2020 09:16 AM   HGBA1C 4.9 11/28/2018 09:30 AM   GFR 91.52 05/10/2021 09:34 AM   GFR 73.16 02/11/2020 12:37 PM    Last diabetic Eye exam: No results found for: "HMDIABEYEEXA"  Last diabetic Foot exam: No results found for: "HMDIABFOOTEX"   Lab Results  Component Value Date   CHOL 166 11/17/2021   HDL 62 11/17/2021   LDLCALC 90 11/17/2021   TRIG  76 11/17/2021   CHOLHDL 2.7 11/17/2021       Latest Ref Rng & Units 11/17/2021    8:35 AM 05/10/2021    9:34 AM 04/06/2020    9:16 AM  Hepatic Function  Total Protein 6.0 - 8.5 g/dL 6.6  7.1  6.3   Albumin 3.8 - 4.8 g/dL 4.8  4.7    AST 0 - 40 IU/L '16  15  12   ' ALT 0 - 32 IU/L '23  14  9   ' Alk Phosphatase 44 - 121 IU/L 68  58    Total Bilirubin 0.0 - 1.2  mg/dL 0.4  0.4  0.5     Lab Results  Component Value Date/Time   TSH 1.75 05/10/2021 09:34 AM   TSH 1.52 04/06/2020 09:16 AM       Latest Ref Rng & Units 05/10/2021    9:34 AM 04/06/2020    9:16 AM 02/11/2020   12:37 PM  CBC  WBC 4.0 - 10.5 K/uL 4.4  4.7  5.2   Hemoglobin 12.0 - 15.0 g/dL 15.5  15.3  15.3   Hematocrit 36.0 - 46.0 % 45.9  45.7  44.5   Platelets 150.0 - 400.0 K/uL 318.0  273  321.0     Lab Results  Component Value Date/Time   VD25OH 87.95 05/10/2021 09:34 AM   VD25OH 56 04/06/2020 09:16 AM   VD25OH 45.19 11/28/2018 09:30 AM    Clinical ASCVD: No  The 10-year ASCVD risk score (Arnett DK, et al., 2019) is: 5%   Values used to calculate the score:     Age: 37 years     Sex: Female     Is Non-Hispanic African American: No     Diabetic: No     Tobacco smoker: No     Systolic Blood Pressure: 932 mmHg     Is BP treated: No     HDL Cholesterol: 62 mg/dL     Total Cholesterol: 166 mg/dL       01/08/2022    1:00 PM 05/10/2021    8:01 AM 04/11/2021    8:16 AM  Depression screen PHQ 2/9  Decreased Interest 3 1 0  Down, Depressed, Hopeless '3 1 1  ' PHQ - 2 Score '6 2 1  ' Altered sleeping 1 0   Tired, decreased energy 2 3   Change in appetite 2 2   Feeling bad or failure about yourself  3 1   Trouble concentrating 3 1   Moving slowly or fidgety/restless 2 2   Suicidal thoughts 2 0   PHQ-9 Score 21 11   Difficult doing work/chores Very difficult       Social History   Tobacco Use  Smoking Status Former   Packs/[name]: 20.00   Years: 1.00   Total pack years: 20.00   Types: Cigarettes   Quit date: 06/19/1999   Years since quitting: 22.6  Smokeless Tobacco Never  Tobacco Comments   20 year estimate but probably less    BP Readings from Last 3 Encounters:  01/11/22 (!) 123/53  01/08/22 102/72  11/17/21 102/64   Pulse Readings from Last 3 Encounters:  01/11/22 91  01/08/22 80  11/17/21 82   Wt Readings from Last 3 Encounters:  01/11/22 188 lb 7.9  oz (85.5 kg)  01/08/22 189 lb 11.2 oz (86 kg)  11/17/21 187 lb 3.2 oz (84.9 kg)   BMI Readings from Last 3 Encounters:  01/11/22 35.62 kg/m  01/08/22 35.84 kg/m  11/17/21 35.37  kg/m    Assessment/Interventions: Review of patient past medical history, allergies, medications, health status, including review of consultants reports, laboratory and other test data, was performed as part of comprehensive evaluation and provision of chronic care management services.   SDOH:  (Social Determinants of Health) assessments and interventions performed: Yes   SDOH Screenings   Alcohol Screen: Not on file  Depression (PHQ2-9): High Risk (01/08/2022)   Depression (PHQ2-9)    PHQ-2 Score: 21  Financial Resource Strain: Low Risk  (06/01/2021)   Overall Financial Resource Strain (CARDIA)    Difficulty of Paying Living Expenses: Not hard at all  Food Insecurity: No Food Insecurity (04/11/2021)   Hunger Vital Sign    Worried About Running Out of Food in the Last Year: Never true    Ran Out of Food in the Last Year: Never true  Housing: Low Risk  (04/11/2021)   Housing    Last Housing Risk Score: 0  Physical Activity: Inactive (04/11/2021)   Exercise Vital Sign    Days of Exercise per Week: 0 days    Minutes of Exercise per Session: 0 min  Social Connections: Moderately Isolated (04/11/2021)   Social Connection and Isolation Panel [NHANES]    Frequency of Communication with Friends and Family: More than three times a week    Frequency of Social Gatherings with Friends and Family: More than three times a week    Attends Religious Services: 1 to 4 times per year    Active Member of [employer] or Organizations: No    Attends Archivist Meetings: Never    Marital Status: Never married  Stress: Stress Concern Present (04/11/2021)   [employer]    Feeling of Stress : To some extent  Tobacco Use: Medium Risk (01/25/2022)    Patient History    Smoking Tobacco Use: Former    Smokeless Tobacco Use: Never    Passive Exposure: Not on file  Transportation Needs: No Transportation Needs (06/01/2021)   PRAPARE - Hydrologist (Medical): No    Lack of Transportation (Non-Medical): No      CCM Care Plan  Allergies  Allergen Reactions   Aspirin Other (See Comments)    discomfort   Atorvastatin     Patient states she felt suicidal   Codeine Itching   Crestor [Rosuvastatin]     Dizzy, leg pain and states she felt suicidal   Gabapentin    Niacin And Related Itching   Sulfa Antibiotics     Other reaction(s): Unknown   Sulfamethoxazole-Trimethoprim Other (See Comments)    Unknown reaction per pt   Topiramate Other (See Comments)    Per patient, it causes stomach burning   Trileptal [Oxcarbazepine]     itching   Trimethoprim Other (See Comments)    Unknown, per pt   Ultram [Tramadol] Itching   Vioxx [Rofecoxib]     Unknown per pt    Medications Reviewed Today     Reviewed by Purnell Shoemaker., MD (Psychiatrist) on 01/25/22 at [place] List Status: <None>   Medication Order Taking? Sig Documenting Provider Last Dose Status Informant  0.9 %  sodium chloride infusion [id number]   Brittany Park, MD  Active   0.9 %  sodium chloride infusion [id number]   Brittany Park, MD  Active   albuterol (PROVENTIL) (2.5 MG/3ML) 0.083% nebulizer solution [id number] Yes Take 3 mLs (2.5 mg total) by nebulization every 6 (six) hours  as needed for wheezing or shortness of breath. Freddi Starr, MD Taking Active   Albuterol Sulfate (PROAIR RESPICLICK) 546 (90 Base) MCG/ACT AEPB [id number] Yes Inhale 2 puffs into the lungs every 6 (six) hours as needed. Freddi Starr, MD Taking Active   Azelastine HCl 137 MCG/SPRAY SOLN [id number] Yes SPRAY ONE SPRAY IN EACH NOSTRIL TWICE DAILY AS DIRECTED Freddi Starr, MD Taking Active   b complex vitamins capsule [id number] Yes Take 1 capsule by  mouth daily. [provider] Taking Active   budesonide-formoterol (SYMBICORT) 160-4.5 MCG/ACT inhaler [id number] Yes Inhale 2 puffs into the lungs 2 (two) times daily. Freddi Starr, MD Taking Active   busPIRone (BUSPAR) 15 MG tablet [id number] Yes Take 1 tablet (15 mg total) by mouth 3 (three) times daily. Cottle, [employer] MD Taking Active   cetirizine (ZYRTEC) 10 MG tablet [id number] Yes Take 1 tablet (10 mg total) by mouth daily. [name], DO Taking Active   Cholecalciferol (VITAMIN D-3 PO) [id number] Yes Take 10,000 Units by mouth daily.  [provider] Taking Active Self  colestipol (COLESTID) 1 g tablet [id number] Yes Take 2 tablets (2 g total) by mouth 2 (two) times daily. Brittany Park, MD Taking Active   COLLAGEN PO [id number] Yes Take by mouth. [provider] Taking Active   diazepam (VALIUM) 5 MG tablet [id number] Yes TAKE ONE TABLET BY MOUTH EVERY 6 HOURS AS NEEDED FOR ANXIETY Cottle, [employer] MD Taking Active   dicyclomine (BENTYL) 20 MG tablet [id number] Yes TAKE ONE TABLET BY MOUTH FOUR TIMES A [name] AS NEEDED FOR Marlene Lard, MD Taking Active   docusate sodium (COLACE) 100 MG capsule [id number] Yes Take 1 capsule (100 mg total) by mouth 2 (two) times daily. While taking narcotic pain medicine. Corky Sing, [place] Taking Active   doxazosin (CARDURA) 4 MG tablet [id number] Yes TAKE TWO TABLETS BY MOUTH EVERY NIGHT AT BEDTIME Cottle, Billey Co., MD Taking Active   famotidine (PEPCID) 20 MG tablet [id number] Yes Take 1 tablet (20 mg total) by mouth 2 (two) times daily.  Patient taking differently: Take 20 mg by mouth as needed.   Freddi Starr, MD Taking Active   ferrous sulfate 325 (65 FE) MG EC tablet [id number] Yes Take 325 mg by mouth 2 (two) times a week. [provider] Taking Active   fluticasone (FLONASE) 50 MCG/ACT nasal spray [id number] Yes SPRAY TWO SPRAYS IN EACH NOSTRIL TWICE DAILY Freddi Starr, MD  Taking Active   MAGNESIUM PO [id number] Yes Calcium citrate, magnesium and vitamin D3 combo [provider] Taking Active   mirtazapine (REMERON) 30 MG tablet [id number] Yes TAKE ONE TABLET BY MOUTH AT BEDTIME Cottle, [employer]ley [employer]., MD Taking Active   montelukast (SINGULAIR) 10 MG tablet [id number] Yes TAKE ONE TABLET BY MOUTH EVERY NIGHT AT BEDTIME Freddi Starr, MD Taking Active   Multiple Vitamin (MULTIVITAMIN WITH MINERALS) TABS tablet [id number] Yes Take 1 tablet by mouth daily. [provider] Taking Active   pantoprazole (PROTONIX) 40 MG tablet [id number] Yes TAKE ONE TABLET BY MOUTH TWICE A Wylene Simmer, MD Taking Active   Red Yeast Rice 600 MG CAPS [id number] Yes Take 1 capsule by mouth daily. [provider] Taking Active   senna (SENOKOT) 8.6 MG TABS tablet [id number] No Take 2 tablets (17.2 mg total) by mouth 2 (two) times daily.  Patient not taking: Reported on 01/25/2022   Corky Sing, [place] Not Taking Active  sodium chloride (OCEAN) 0.65 % SOLN nasal spray [id number] Yes Place 1 spray into both nostrils as needed for congestion. [provider] Taking Active             Patient Active Problem List   Diagnosis Date Noted   Fatigue 01/08/2022   Morbid obesity ([place]) 01/08/2022   Clotting disorder ([place]) 01/24/2021   Pulmonary fibrosis ([place]) 04/06/2020   Dizziness 02/11/2020   Melena 04/22/2019   Suprapubic abdominal pain 04/22/2019   Osteopenia 12/04/2016   Chronic post-traumatic stress disorder (PTSD) 08/23/2016   Bipolar affective disorder, current episode depressed ([place]) 08/23/2016   Hyperlipidemia 08/23/2016   COPD / AB vs ACOS 06/22/2016   Cough variant asthma  vs UACS  06/21/2016   GERD 04/03/2007    Immunization History  Administered Date(s) Administered   Fluad Quad(high Dose 65+) 02/28/2021   Influenza Split 04/26/2011   Influenza Whole 03/18/2009, 03/18/2010, 03/18/2016   Influenza,inj,Quad PF,6+ Mos  04/19/2014, 02/28/2017, 03/06/2018, 02/13/2019, 03/04/2020   PFIZER(Purple Top)SARS-COV-2 Vaccination 09/04/2019, 09/29/2019, 04/06/2020, 04/09/2021   PNEUMOCOCCAL CONJUGATE-20 05/10/2021   Pneumococcal Conjugate-13 03/10/2015   Pneumococcal Polysaccharide-23 06/18/2005   Tdap 12/04/2016   Zoster Recombinat (Shingrix) 01/01/2018, 06/22/2018   Patient reports she is very out of breath and just got a different walking shoe. She is in quite a bit of pain because she has been on it so much over the last couple of hours.   Patient is taking red yeast rice supplement. She felt like the statin wasn't really physical side effects but felt it affected her mental health. She reports it was the worst she was in the last 15 years. She has been taking it for 3 days so far. She reports she had some heartburn with it the first [name]. Patient did look at Zetia with savings card and was $30 a month, which she cannot afford.   Patient is only taking pantoprazole once a [name], famotidine as needed, and Symbicort 2 puffs in the morning and 1 puff in the evening due to cost. She is also taking colestipol 1 tablet twice daily due to cost as well and she feels like she doesn't need as high of a dose that she is on.    Patient report she makes too much to qualify for medicare extra help.  Conditions to be addressed/monitored:  Hyperlipidemia, GERD, COPD, Asthma, Osteopenia, and PTSD, Bipolar disorder  Conditions addressed this visit: Hyperlipidemia, GERD  Care Plan : [place]  Updates made by Brittany [name], [place] since 02/05/2022 12:00 AM     Problem: Problem: Hyperlipidemia, GERD, COPD, Asthma, Osteopenia, and PTSD, Bipolar disorder      Long-Range Goal: Patient-Specific Goal   Start Date: 05/19/2021  Expected End Date: 05/19/2022  Recent Progress: On track  Priority: High  Note:   Current Barriers:  Unable to independently afford treatment regimen Unable to independently monitor therapeutic  efficacy  Pharmacist Clinical Goal(s):  Patient will verbalize ability to afford treatment regimen achieve adherence to monitoring guidelines and medication adherence to achieve therapeutic efficacy through collaboration with PharmD and provider.   Interventions: 1:1 collaboration with Caren Macadam, MD regarding development and update of comprehensive plan of care as evidenced by provider attestation and co-signature Inter-disciplinary care team collaboration (see longitudinal plan of care) Comprehensive medication review performed; medication list updated in electronic medical record  Hyperlipidemia: (LDL goal < 100) -Uncontrolled (without medications) -Current treatment: Fish oil 1000 mg daily - Query Appropriate, Query effective, Safe, Accessible Red yeast  rice daily - Query Appropriate, Query effective, Query Safe, Accessible -Medications previously tried: atorvastatin (side effects) -Current dietary patterns: following ketogenic diet -Current exercise habits: not active right now -Educated on Cholesterol goals;  Importance of limiting foods high in cholesterol; Exercise goal of 150 minutes per week; -Counseled on risks of taking red yeast rice and advised against using it.  COPD/Asthma (Goal: control symptoms and prevent exacerbations) -Controlled -Current treatment  Symbicort 160-4.5 mcg/act inhale 2 puffs twice daily - Appropriate, Effective, Safe, Query accessible Montelukast 10 mg 1 tablet daily - Appropriate, Effective, Query Safe, Accessible Albuterol nebulizer as needed (not using often) - Appropriate, Effective, Safe, Accessible Albuterol HFA as needed (not using often) - Appropriate, Effective, Safe, Accessible -Medications previously tried: Librarian, academic (made her worse)  -Gold Grade:  -Gold Grade: Gold 1 (FEV1>80%) -Current COPD Classification:  A (low sx, <2 exacerbations/yr) -MMRC/CAT score: n/a -Pulmonary function testing: 10/25/20 -Exacerbations requiring  treatment in last 6 months: none -Patient reports consistent use of maintenance inhaler -Frequency of rescue inhaler use: uses more August and September (couple times a week) -Counseled on Proper inhaler technique; Benefits of consistent maintenance inhaler use When to use rescue inhaler Differences between maintenance and rescue inhalers -Counseled on diet and exercise extensively Recommended to continue current medication  Anxiety/PTSD/Bipolar disorder (Goal: minimize symptoms) -Not ideally controlled -Current treatment: Buspirone 15 mg 1 tablet three times daily - Appropriate, Query effective, Safe, Accessible Doxazosin 4 mg 2 tablets at bedtime - taking 1 tablet at bedtime - Appropriate, Query effective, Safe, Accessible Mirtazapine 30 mg 1 tablet at bedtime - Appropriate, Query effective, Safe, Accessible Diazepam 5 mg 1 tablet every 6 hours as needed for anxiety - Appropriate, Query effective, Query Safe, Accessible -Medications previously tried/failed: Xanax (disassociative stages), sertraline, Prozac (side effects), Zoloft (side effects), Lexapro (side effects) -PHQ9: 11 -GAD7: n/a -Educated on Benefits of medication for symptom control Benefits of cognitive-behavioral therapy with or without medication -Counseled on risk for benzodiazepines (diazepam, clonazepam, alprazolam, etc.) for long term use including worsening of PTSD.  Osteopenia (Goal prevent fractures) -Not ideally controlled -Last DEXA Scan: 02/13/19   T-Score femoral neck: -1.7  T-Score total hip: n/a  T-Score lumbar spine: -0.9  T-Score forearm radius: n/a  10-year probability of major osteoporotic fracture: 14.9%  10-year probability of hip fracture: 1.7% -Patient is not a candidate for pharmacologic treatment -Current treatment  Multivitamin 1 tablet daily (220 mg of calcium, 1000 units of vitamin D) - Appropriate, Effective, Safe, Accessible Calcium, mag & vitamin D (500 mg calcium, 1000 units of vitamin  D) 1 tablet daily - Appropriate, Effective, Safe, Accessible Vitamin D 3000 units daily (3 x 1000 unit capsules) - Appropriate, Effective, Safe, Accessible -Medications previously tried: none  -Recommend [phone] units of vitamin D daily. Recommend 1200 mg of calcium daily from dietary and supplemental sources. Recommend weight-bearing and muscle strengthening exercises for building and maintaining bone density. Recommended increasing calcium, mag & vitamin D supplement to twice daily and stopping super C. -Recommended repeat DEXA.  GERD (Goal: minimize symptoms) -Controlled -Current treatment  Pantoprazole 40 mg 1 tablet twice daily (taking once daily) - Appropriate, Effective, Safe, Query accessible Famotidine 20 mg 1 tablet twice daily - Appropriate, Effective, Safe, Accessible Cholestyramine 1 g 2 tablets twice daily Appropriate, Effective, Safe, Query accessible Dicyclomine 20 mg as needed - Appropriate, Effective, Safe, Accessible -Medications previously tried: n/a  -Counseled on non-pharmacologic management of symptoms such as elevating the head of your bed, avoiding eating 2-3 hours before bed, avoiding triggering  foods such as acidic, spicy, or fatty foods, eating smaller meals, and wearing clothes that are loose around the waist Recommended colesevelam due to cost.  Allergic rhinitis (Goal: minimize symptoms) -Not ideally controlled -Current treatment  Fluticasone 50 macg/act as needed - Appropriate, Effective, Safe, Accessible Cetirizine 10 mg 1 tablet daily - Appropriate, Effective, Safe, Accessible -Medications previously tried: atrovent  -Counseled on risk for montelukast worsening anxiety. Recommended trial and error for figuring out which medications are helping.   Health Maintenance -Vaccine gaps: none -Current therapy:  Biotin daily Vitamin B complex daily Vitamin C daily Collagen daily Ferrous sulfate 325 mg 1 tablet daily Multivitamin 1 tablet daily Magnesium  daily Zinc 50 mg 1 tablet daily -Educated on Herbal supplement research is limited and benefits usually cannot be proven Cost vs benefit of each product must be carefully weighed by individual consumer Supplements may interfere with prescription drugs -Patient is satisfied with current therapy and denies issues -Recommended to continue current medication  Patient Goals/Self-Care Activities Patient will:  - take medications as prescribed as evidenced by patient report and record review target a minimum of 150 minutes of moderate intensity exercise weekly  Follow Up Plan: The care management team will reach out to the patient again over the next 60 days.          Medication Assistance:  Symbicort obtained through [place] medication assistance program.  Enrollment ends 06/17/22  Compliance/Adherence/Medication fill history: Care Gaps: Influenza vaccine  Star-Rating Drugs: Atorvastatin 20 mg - last filled 11/26/21 for 90 ds at [facility]  Patient's preferred pharmacy is:  [facility] [id number] - [address], [place] [address] [place] [address] Phone: [phone] Fax: [phone]   Uses pill box? Yes Pt endorses 99% compliance  We discussed: Current pharmacy is preferred with insurance plan and patient is satisfied with pharmacy services Patient decided to: Continue current medication management strategy  Care Plan and Follow Up Patient Decision:  Patient agrees to Care Plan and Follow-up.  Plan: The care management team will reach out to the patient again over the next 60 days.  Jeni Salles, PharmD, [place] Pharmacist [facility] [place] at [place]

## 2022-01-24 NOTE — Patient Instructions (Addendum)
Hi Adi,  It was great to get to catch up with you again! Like I mentioned on the phone, I usually advised against using red yeast rice because it is not regulated and there have been cases of serious liver problems with it which I don't want to happen here. I really think you should strongly consider trying another non statin prescription cholesterol medication.  I'll reach back out after your next visit with Dr. Legrand Como.   Please reach out to me if you have any questions or need anything in the meantime!  Best, Maddie  Jeni Salles, PharmD, [place] Pharmacist [place] at [place]

## 2022-01-24 NOTE — Telephone Encounter (Signed)
Order entered

## 2022-01-25 ENCOUNTER — Ambulatory Visit: Payer: PPO | Admitting: Psychiatry

## 2022-01-25 ENCOUNTER — Encounter: Payer: Self-pay | Admitting: Psychiatry

## 2022-01-25 DIAGNOSIS — F3162 Bipolar disorder, current episode mixed, moderate: Secondary | ICD-10-CM | POA: Diagnosis not present

## 2022-01-25 DIAGNOSIS — F4310 Post-traumatic stress disorder, unspecified: Secondary | ICD-10-CM | POA: Diagnosis not present

## 2022-01-25 DIAGNOSIS — F515 Nightmare disorder: Secondary | ICD-10-CM | POA: Diagnosis not present

## 2022-01-25 DIAGNOSIS — F4001 Agoraphobia with panic disorder: Secondary | ICD-10-CM

## 2022-01-25 DIAGNOSIS — F4312 Post-traumatic stress disorder, chronic: Secondary | ICD-10-CM | POA: Diagnosis not present

## 2022-01-25 MED ORDER — BUSPIRONE HCL 15 MG PO TABS: 15.0000 mg | ORAL_TABLET | Freq: Three times a day (TID) | ORAL | 1 refills | Status: DC

## 2022-01-25 MED ORDER — DIAZEPAM 5 MG PO TABS: 5.0000 mg | ORAL_TABLET | Freq: Four times a day (QID) | ORAL | 5 refills | Status: DC | PRN
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
## 2022-01-29 ENCOUNTER — Telehealth: Payer: Self-pay | Admitting: *Deleted

## 2022-01-29 NOTE — Telephone Encounter (Signed)
Referral previously entered on 8/9.

## 2022-01-29 NOTE — Telephone Encounter (Signed)
-----   Message from Farrel Conners, MD sent at 01/28/2022  9:02 PM EDT ----- Regarding: FW: CCM referral Hey! I'm not sure how to put in a CCM referral from a message-- can you put in the referral for me? Thanks! ----- Message ----- From: Viona Gilmore, [facility] Sent: 01/23/2022   8:50 AM EDT To: Farrel Conners, MD Subject: CCM referral                                   Hi,  I have Ms. Hassell Done on my schedule tomorrow for a follow up telephone call and unfortunately with [facility], they require that the current PCP puts in a CCM referral in order for me to see the patients (even though the previous PCP put one in already). I will try to send these gradually but I apologize in advance if you see a lot of messages from me about this. Can you possibly put in a referral for her? The code is [id number] and you have to select pharmacy. I would be happy to walk you through how to put it in. If you would prefer for me to send these to your CMA, I am happy to do that as well.  Just let me know either way! And I am waiting to request these from you until after you have a TOC visit of course.  Thanks, Maddie

## 2022-01-31 ENCOUNTER — Ambulatory Visit: Payer: PPO | Admitting: Psychiatry

## 2022-02-12 DIAGNOSIS — K580 Irritable bowel syndrome with diarrhea: Secondary | ICD-10-CM | POA: Diagnosis not present

## 2022-02-15 ENCOUNTER — Encounter: Payer: Self-pay | Admitting: Family Medicine

## 2022-02-23 ENCOUNTER — Other Ambulatory Visit: Payer: PPO

## 2022-02-28 DIAGNOSIS — Z4789 Encounter for other orthopedic aftercare: Secondary | ICD-10-CM | POA: Diagnosis not present

## 2022-02-28 DIAGNOSIS — M7742 Metatarsalgia, left foot: Secondary | ICD-10-CM | POA: Diagnosis not present

## 2022-03-01 ENCOUNTER — Telehealth: Payer: Self-pay | Admitting: Psychiatry

## 2022-03-01 ENCOUNTER — Other Ambulatory Visit: Payer: Self-pay

## 2022-03-01 MED ORDER — MIRTAZAPINE 45 MG PO TABS: 45.0000 mg | ORAL_TABLET | Freq: Every day | ORAL | 1 refills | Status: DC

## 2022-03-01 NOTE — Telephone Encounter (Signed)
Usually sleep is better with lower doses of mirtazapine rather than higher but if she is in her case the opposite is true that is fine. But let her know they make a 45 mg tablet please send in a prescription for mirtazapine 45 mg tablets, 1 nightly, #30, 1 additional refill

## 2022-03-01 NOTE — Telephone Encounter (Signed)
Rx sent 

## 2022-03-01 NOTE — Telephone Encounter (Signed)
Brittany [name] called this morning at 9:50 to report that she tried the adjustment of her mirtazapine.  She has found that taking 1.5 tablets per day helps the most.  She may have an occasional waking in the night but again for the most part the 1.5 tabs works.  She will run out early of her medication if she continues this dose.  If you are ok with this dose, please send a new prescription at the new dose. Appt 07/31/22.  Kristopher Oppenheim on [address]

## 2022-03-01 NOTE — Telephone Encounter (Signed)
Patient is taking 45 mg of mirtazapine. She said she still has an occasional night where she doesn't sleep all night, but this dose works much better than 15 or 30. Your last note mentions option to decrease dose but not increase. Please advise if ok to take increased dose and I can send in new Rx.   From your 8/10 note:  Continue buspirone 15 mg 3 times daily,  and mirtazapine 30 mg nightly for sleep. Option to reduce mirtazapine to see if sleep is better.

## 2022-03-16 DIAGNOSIS — R140 Abdominal distension (gaseous): Secondary | ICD-10-CM | POA: Diagnosis not present

## 2022-03-16 DIAGNOSIS — R197 Diarrhea, unspecified: Secondary | ICD-10-CM | POA: Diagnosis not present

## 2022-03-20 ENCOUNTER — Encounter: Payer: Self-pay | Admitting: Family Medicine

## 2022-03-28 DIAGNOSIS — M7742 Metatarsalgia, left foot: Secondary | ICD-10-CM | POA: Diagnosis not present

## 2022-04-06 ENCOUNTER — Encounter: Payer: Self-pay | Admitting: Primary Care

## 2022-04-06 ENCOUNTER — Ambulatory Visit (INDEPENDENT_AMBULATORY_CARE_PROVIDER_SITE_OTHER): Payer: PPO | Admitting: Primary Care

## 2022-04-06 VITALS — BP 110/74 | HR 85 | Temp 98.3°F | Ht 61.0 in | Wt 188.8 lb

## 2022-04-06 DIAGNOSIS — J4521 Mild intermittent asthma with (acute) exacerbation: Secondary | ICD-10-CM | POA: Diagnosis not present

## 2022-04-06 DIAGNOSIS — J4540 Moderate persistent asthma, uncomplicated: Secondary | ICD-10-CM | POA: Diagnosis not present

## 2022-04-06 DIAGNOSIS — K638219 Small intestinal bacterial overgrowth, unspecified: Secondary | ICD-10-CM | POA: Diagnosis not present

## 2022-04-06 MED ORDER — FLUTICASONE-SALMETEROL 230-21 MCG/ACT IN AERO: 2.0000 | INHALATION_SPRAY | Freq: Two times a day (BID) | RESPIRATORY_TRACT | 1 refills | Status: DC

## 2022-04-06 MED ORDER — PSEUDOEPHEDRINE-GUAIFENESIN ER 60-600 MG PO TB12: 1.0000 | ORAL_TABLET | Freq: Two times a day (BID) | ORAL | 0 refills | Status: DC

## 2022-04-06 MED ORDER — METHYLPREDNISOLONE ACETATE 80 MG/ML IJ SUSP
80.0000 mg | Freq: Once | INTRAMUSCULAR | Status: AC
  Administered 2022-04-06: 80 mg via INTRAMUSCULAR

## 2022-04-06 MED ORDER — AZITHROMYCIN 250 MG PO TABS: ORAL_TABLET | ORAL | 0 refills | Status: DC

## 2022-04-06 NOTE — Patient Instructions (Addendum)
Recommendations: Continue Symbicort 156mg two puffs morning and evening Use Albuterol 2 puffs every 4-6 hours as needed for breakthrough shortness of breath.wheezing   Take mucinex-d '600mg'$  twice daily x 5-7 days (rx sent to pharmacy) [place](Rx sent to pharmacy) Continue nasal sprays as directed  If not better may want chest imaging and/or change maintenance inhaler  Office treatment: Depomedrol IM x1 in office  Follow-up: First available 6-8 weeks with Dr. DErin Fulling

## 2022-04-06 NOTE — Progress Notes (Unsigned)
$'@Patient'y$  ID: Brittany [name], female    DOB: Sep 30, 1955, 66 y.o.   MRN: [id number]  No chief complaint on file.   Referring provider: Farrel Conners, MD  HPI: 66 year old female, former smoker quit in 2001 (20-pack-year history).  Past medical history significant for moderate persistent asthma.  Patient of Dr. Erin Fulling, last seen in office on 11/15/2021.   04/06/2022 Patient presents today for acute office visit.  He has symptoms of congestion and wheezing for the last 2 months. Congestion is more in her throat. Typically decongestants help. She is coughing a lot more. She has associated chest pressure and shortness of breath. Does not feel like her typical asthma flare. She is taking Symbicort 2 puffs twice daily, singular 10 mg at bedtime, Zyrtec 10 mg daily, Flonase/Astelin nasal spray. She uses albuterol   Seeing GI specialist at [facility] d/x SIBO/IMO. Currently on doxycycline, awaiting PA for xifaxin.  She has bloating, diarrhea, severe abdominal pain  Hx post inflammatory fibrosis d.t covid    Allergies  Allergen Reactions   Aspirin Other (See Comments)    discomfort   Atorvastatin     Patient states she felt suicidal   Codeine Itching   Crestor [Rosuvastatin]     Dizzy, leg pain and states she felt suicidal   Gabapentin    Niacin And Related Itching   Sulfa Antibiotics     Other reaction(s): Unknown   Sulfamethoxazole-Trimethoprim Other (See Comments)    Unknown reaction per pt   Topiramate Other (See Comments)    Per patient, it causes stomach burning   Trileptal [Oxcarbazepine]     itching   Trimethoprim Other (See Comments)    Unknown, per pt   Ultram [Tramadol] Itching   Vioxx [Rofecoxib]     Unknown per pt    Immunization History  Administered Date(s) Administered   Fluad Quad(high Dose 65+) 02/28/2021   Influenza Split 04/26/2011   Influenza Whole 03/18/2009, 03/18/2010, 03/18/2016   Influenza,inj,Quad PF,6+ Mos 04/19/2014, 02/28/2017, 03/06/2018,  02/13/2019, 03/04/2020   Influenza-Unspecified 02/22/2022   PFIZER(Purple Top)SARS-COV-2 Vaccination 09/04/2019, 09/29/2019, 04/06/2020, 04/09/2021   PNEUMOCOCCAL CONJUGATE-20 05/10/2021   [place] Bivalent Pediatric Vaccine(1mo to <552yr 03/17/2022   Pneumococcal Conjugate-13 03/10/2015   Pneumococcal Polysaccharide-23 06/18/2005   Respiratory Syncytial Virus Vaccine,Recomb Aduvanted(Arexvy) 03/08/2022   Tdap 12/04/2016   Zoster Recombinat (Shingrix) 01/01/2018, 06/22/2018    Past Medical History:  Diagnosis Date   Abnormal EKG    Allergy    Anxiety    Asthma    Bipolar depression ([place] Anxiety, PTSD, Panic disorder    -managed by [facility]   Depression    Foot pain    GERD (gastroesophageal reflux disease)    Heart murmur    as a child   Insomnia    Leg swelling    Osteoporosis    osteopenia   Pneumonia due to COVID-19 virus 04/22/2019   Tachycardia     Tobacco History: Social History   Tobacco Use  Smoking Status Former   Packs/day: 20.00   Years: 1.00   Total pack years: 20.00   Types: Cigarettes   Quit date: 06/19/1999   Years since quitting: 22.8  Smokeless Tobacco Never  Tobacco Comments   20 year estimate but probably less    Counseling given: Not Answered Tobacco comments: 20 year estimate but probably less    Outpatient Medications Prior to Visit  Medication Sig Dispense Refill   albuterol (PROVENTIL) (2.5 MG/3ML) 0.083% nebulizer solution Take 3 mLs (2.5 mg  total) by nebulization every 6 (six) hours as needed for wheezing or shortness of breath. 75 mL 12   Albuterol Sulfate (PROAIR RESPICLICK) 151 (90 Base) MCG/ACT AEPB Inhale 2 puffs into the lungs every 6 (six) hours as needed. 1 each 6   Azelastine HCl 137 MCG/SPRAY SOLN SPRAY ONE SPRAY IN EACH NOSTRIL TWICE DAILY AS DIRECTED 30 mL 1   b complex vitamins capsule Take 1 capsule by mouth daily.     budesonide-formoterol (SYMBICORT) 160-4.5 MCG/ACT inhaler Inhale 2 puffs into the  lungs 2 (two) times daily. 1 each 11   busPIRone (BUSPAR) 15 MG tablet Take 1 tablet (15 mg total) by mouth 3 (three) times daily. 270 tablet 1   cetirizine (ZYRTEC) 10 MG tablet Take 1 tablet (10 mg total) by mouth daily. 90 tablet 1   Cholecalciferol (VITAMIN D-3 PO) Take 10,000 Units by mouth daily.      colestipol (COLESTID) 1 g tablet Take 2 tablets (2 g total) by mouth 2 (two) times daily. 120 tablet 2   COLLAGEN PO Take by mouth.     diazepam (VALIUM) 5 MG tablet Take 1 tablet (5 mg total) by mouth every 6 (six) hours as needed. for anxiety 120 tablet 5   dicyclomine (BENTYL) 20 MG tablet TAKE ONE TABLET BY MOUTH FOUR TIMES A DAY AS NEEDED FOR SPASMS 360 tablet 3   docusate sodium (COLACE) 100 MG capsule Take 1 capsule (100 mg total) by mouth 2 (two) times daily. While taking narcotic pain medicine. 30 capsule 0   doxazosin (CARDURA) 4 MG tablet TAKE TWO TABLETS BY MOUTH EVERY NIGHT AT BEDTIME 180 tablet 0   famotidine (PEPCID) 20 MG tablet Take 1 tablet (20 mg total) by mouth 2 (two) times daily. (Patient taking differently: Take 20 mg by mouth as needed.) 180 tablet 3   ferrous sulfate 325 (65 FE) MG EC tablet Take 325 mg by mouth 2 (two) times a week.     fluticasone (FLONASE) 50 MCG/ACT nasal spray SPRAY TWO SPRAYS IN EACH NOSTRIL TWICE DAILY 96 mL 2   MAGNESIUM PO Calcium citrate, magnesium and vitamin D3 combo     mirtazapine (REMERON) 45 MG tablet Take 1 tablet (45 mg total) by mouth at bedtime. 30 tablet 1   montelukast (SINGULAIR) 10 MG tablet TAKE ONE TABLET BY MOUTH EVERY NIGHT AT BEDTIME 90 tablet 2   Multiple Vitamin (MULTIVITAMIN WITH MINERALS) TABS tablet Take 1 tablet by mouth daily.     pantoprazole (PROTONIX) 40 MG tablet TAKE ONE TABLET BY MOUTH TWICE A DAY 180 tablet 3   Red Yeast Rice 600 MG CAPS Take 1 capsule by mouth daily.     senna (SENOKOT) 8.6 MG TABS tablet Take 2 tablets (17.2 mg total) by mouth 2 (two) times daily. (Patient not taking: Reported on 01/25/2022)  30 tablet 0   sodium chloride (OCEAN) 0.65 % SOLN nasal spray Place 1 spray into both nostrils as needed for congestion.     Facility-Administered Medications Prior to Visit  Medication Dose Route Frequency Provider Last Rate Last Admin   0.9 %  sodium chloride infusion  500 mL Intravenous Once [place], MD       0.9 %  sodium chloride infusion  500 mL Intravenous Once Thornton Park, MD       Review of Systems  Review of Systems  Constitutional: Negative.   HENT:  Positive for congestion.   Respiratory:  Positive for cough, chest tightness and shortness of breath.  Cardiovascular: Negative.     Physical Exam  There were no vitals taken for this visit. Physical Exam Constitutional:      Appearance: Normal appearance.  HENT:     Head: Normocephalic and atraumatic.  Cardiovascular:     Rate and Rhythm: Normal rate and regular rhythm.  Pulmonary:     Effort: Pulmonary effort is normal.     Breath sounds: Normal breath sounds. No wheezing, rhonchi or rales.     Comments: CTA Neurological:     General: No focal deficit present.     Mental Status: She is alert and oriented to person, place, and time. Mental status is at baseline.     Coordination: Coordination normal.  Psychiatric:        Mood and Affect: Mood normal.        Behavior: Behavior normal.        Thought Content: Thought content normal.        Judgment: Judgment normal.      Lab Results:  CBC    Component Value Date/Time   WBC 4.4 05/10/2021 0934   RBC 5.04 05/10/2021 0934   HGB 15.5 (H) 05/10/2021 0934   HCT 45.9 05/10/2021 0934   PLT 318.0 05/10/2021 0934   MCV 91.1 05/10/2021 0934   MCH 31.7 04/06/2020 0916   MCHC 33.8 05/10/2021 0934   RDW 14.6 05/10/2021 0934   LYMPHSABS 0.8 05/10/2021 0934   MONOABS 0.3 05/10/2021 0934   EOSABS 0.1 05/10/2021 0934   BASOSABS 0.0 05/10/2021 0934    BMET    Component Value Date/Time   NA 141 11/17/2021 0835   K 4.4 11/17/2021 0835   CL 102  11/17/2021 0835   CO2 25 11/17/2021 0835   GLUCOSE 94 11/17/2021 0835   GLUCOSE 97 05/10/2021 0934   BUN 16 11/17/2021 0835   CREATININE 0.75 11/17/2021 0835   CREATININE 0.94 04/06/2020 0916   CALCIUM 9.7 11/17/2021 0835   GFRNONAA >60 05/01/2019 0155   GFRAA >60 05/01/2019 0155    BNP    Component Value Date/Time   BNP 22.3 05/01/2019 0155    ProBNP No results found for: "PROBNP"  Imaging: No results found.   Assessment & Plan:   No problem-specific Assessment & Plan notes found for this encounter.     Martyn Ehrich, NP 04/06/2022

## 2022-04-09 DIAGNOSIS — K638219 Small intestinal bacterial overgrowth, unspecified: Secondary | ICD-10-CM | POA: Insufficient documentation

## 2022-04-09 DIAGNOSIS — J45901 Unspecified asthma with (acute) exacerbation: Secondary | ICD-10-CM | POA: Insufficient documentation

## 2022-04-09 NOTE — Assessment & Plan Note (Signed)
-   Patient is following with specialist at [facility] for possible SIBO/IMO. She has symptoms of bloating, diarrhea and abdominal pain. She is currently on Doxycycline and awaiting PA for xifaxin

## 2022-04-09 NOTE — Assessment & Plan Note (Addendum)
-   Hx post inflammatory fibrosis d/t covid. She reports increased chest congestion, chest tightness and wheezing for 2 months. Symptoms consistent with acute flare vs worsening disease state. Lungs were clear on exam. She can not tolerate oral prednisone but has received and done well with depomedrol shot - Continue Symbicort 167mg two puffs morning and evening and Albuterol 2 puffs every 4-6 hours as needed for breakthrough shortness of breath.wheezing   - Advised she take mucinex-d '600mg'$  twice daily x 5-7 days (rx sent to pharmacy) - Add Zpack (Rx sent to pharmacy) - Continue nasal sprays as directed  - If not better may want check chest imaging d/t inflammatory fibrosis, update PFTs and/or change maintenance inhaler  Office treatment: Depomedrol '80mg'$  IM x1 in office  Follow-up: First available 6-8 weeks with Dr. DErin Fulling

## 2022-04-10 ENCOUNTER — Encounter: Payer: PPO | Admitting: Family Medicine

## 2022-04-18 ENCOUNTER — Telehealth: Payer: Self-pay

## 2022-04-18 NOTE — Telephone Encounter (Signed)
Unsuccessful attempt to reach patient on preferred number listed in notes for scheduled AWV. Left message on voicemail okay to reschedule. 

## 2022-04-19 ENCOUNTER — Other Ambulatory Visit: Payer: Self-pay | Admitting: *Deleted

## 2022-04-19 MED ORDER — FLUTICASONE PROPIONATE 50 MCG/ACT NA SUSP: 2.0000 | Freq: Every day | NASAL | 2 refills | Status: DC

## 2022-04-23 ENCOUNTER — Encounter: Payer: Self-pay | Admitting: Family Medicine

## 2022-04-23 ENCOUNTER — Ambulatory Visit (INDEPENDENT_AMBULATORY_CARE_PROVIDER_SITE_OTHER): Payer: PPO | Admitting: Family Medicine

## 2022-04-23 VITALS — BP 110/78 | HR 77 | Temp 97.5°F | Ht 62.0 in | Wt 183.9 lb

## 2022-04-23 DIAGNOSIS — R5383 Other fatigue: Secondary | ICD-10-CM | POA: Diagnosis not present

## 2022-04-23 DIAGNOSIS — E782 Mixed hyperlipidemia: Secondary | ICD-10-CM

## 2022-04-23 DIAGNOSIS — Z0000 Encounter for general adult medical examination without abnormal findings: Secondary | ICD-10-CM

## 2022-04-23 LAB — LIPID PANEL
Cholesterol: 255 mg/dL — ABNORMAL HIGH (ref 0–200)
HDL: 71.9 mg/dL (ref >39.00)
LDL Cholesterol: 167 mg/dL — ABNORMAL HIGH (ref 0–99)
NonHDL: 183.51
Total CHOL/HDL Ratio: 4
Triglycerides: 81 mg/dL (ref 0.0–149.0)
VLDL: 16.2 mg/dL (ref 0.0–40.0)

## 2022-04-23 LAB — TSH: TSH: 1.7 u[IU]/mL (ref 0.35–5.50)

## 2022-04-23 NOTE — Progress Notes (Signed)
Complete physical exam  Patient: Brittany [name]   DOB: Oct 24, 1955   66 y.o. Female  MRN: [id number]  Subjective:    Chief Complaint  Patient presents with  . Annual Exam    Brittany [name] is a 66 y.o. female who presents today for a complete physical exam. She reports consuming a general diet. *** She generally feels well. She reports sleeping fairly well. She does not have additional problems to discuss today.   States she was recently seen by the GI doctor -- states that he is putting her on Xifaximin or her IBS, but is waiting for the patient assistance program to be able to afford this medication.   Most recent fall risk assessment:    04/23/2022    8:52 AM  [place] in the past year? 0  Number falls in past yr: 0  Injury with Fall? 0  Risk for fall due to : No Fall Risks  Follow up Falls evaluation completed     Most recent depression screenings:    04/23/2022    8:52 AM 01/08/2022    1:00 PM  PHQ 2/9 Scores  PHQ - 2 Score 4 6  PHQ- 9 Score 13 21    Vision:Within last year and Dental: No regular dental care   Patient Active Problem List   Diagnosis Date Noted  . Asthma exacerbation 04/09/2022  . Small intestinal bacterial overgrowth (SIBO) 04/09/2022  . Fatigue 01/08/2022  . Morbid obesity ([place]) 01/08/2022  . Clotting disorder ([place]) 01/24/2021  . Pulmonary fibrosis ([place]) 04/06/2020  . Dizziness 02/11/2020  . Melena 04/22/2019  . Suprapubic abdominal pain 04/22/2019  . Osteopenia 12/04/2016  . Chronic post-traumatic stress disorder (PTSD) 08/23/2016  . Bipolar affective disorder, current episode depressed ([place]) 08/23/2016  . Hyperlipidemia 08/23/2016  . COPD / AB vs ACOS 06/22/2016  . Cough variant asthma  vs UACS  06/21/2016  . GERD 04/03/2007      Patient Care Team: Farrel Conners, MD as PCP - General (Family Medicine) Josue Hector, MD as PCP - Cardiology (Cardiology) Viona Gilmore, [facility] as Pharmacist (Pharmacist)   Outpatient  Medications Prior to Visit  Medication Sig  . albuterol (PROVENTIL) (2.5 MG/3ML) 0.083% nebulizer solution Take 3 mLs (2.5 mg total) by nebulization every 6 (six) hours as needed for wheezing or shortness of breath.  . Albuterol Sulfate (PROAIR RESPICLICK) 347 (90 Base) MCG/ACT AEPB Inhale 2 puffs into the lungs every 6 (six) hours as needed.  . Azelastine HCl 137 MCG/SPRAY SOLN SPRAY ONE SPRAY IN EACH NOSTRIL TWICE DAILY AS DIRECTED  . b complex vitamins capsule Take 1 capsule by mouth daily.  . busPIRone (BUSPAR) 15 MG tablet Take 1 tablet (15 mg total) by mouth 3 (three) times daily.  . cetirizine (ZYRTEC) 10 MG tablet Take 1 tablet (10 mg total) by mouth daily.  . Cholecalciferol (VITAMIN D-3 PO) Take 10,000 Units by mouth daily.   . COLLAGEN PO Take by mouth.  . diazepam (VALIUM) 5 MG tablet Take 1 tablet (5 mg total) by mouth every 6 (six) hours as needed. for anxiety  . dicyclomine (BENTYL) 20 MG tablet TAKE ONE TABLET BY MOUTH FOUR TIMES A DAY AS NEEDED FOR SPASMS  . docusate sodium (COLACE) 100 MG capsule Take 1 capsule (100 mg total) by mouth 2 (two) times daily. While taking narcotic pain medicine.  . doxazosin (CARDURA) 4 MG tablet TAKE TWO TABLETS BY MOUTH EVERY NIGHT AT BEDTIME  .  famotidine (PEPCID) 20 MG tablet Take 1 tablet (20 mg total) by mouth 2 (two) times daily. (Patient taking differently: Take 20 mg by mouth as needed.)  . ferrous sulfate 325 (65 FE) MG EC tablet Take 325 mg by mouth 2 (two) times a week.  . fluticasone (FLONASE) 50 MCG/ACT nasal spray Place 2 sprays into both nostrils daily.  Marland Kitchen MAGNESIUM PO Calcium citrate, magnesium and vitamin D3 combo  . mirtazapine (REMERON) 45 MG tablet Take 1 tablet (45 mg total) by mouth at bedtime.  . montelukast (SINGULAIR) 10 MG tablet TAKE ONE TABLET BY MOUTH EVERY NIGHT AT BEDTIME  . Multiple Vitamin (MULTIVITAMIN WITH MINERALS) TABS tablet Take 1 tablet by mouth daily.  . pantoprazole (PROTONIX) 40 MG tablet TAKE ONE TABLET  BY MOUTH TWICE A DAY  . pseudoephedrine-guaifenesin (MUCINEX D) 60-600 MG 12 hr tablet Take 1 tablet by mouth every 12 (twelve) hours.  . Red Yeast Rice 600 MG CAPS Take 1 capsule by mouth daily.  . sodium chloride (OCEAN) 0.65 % SOLN nasal spray Place 1 spray into both nostrils as needed for congestion.  . [DISCONTINUED] azithromycin (ZITHROMAX) 250 MG tablet Take 2 tablets on day one; Then take 1 tablet daily x 4 days  . [DISCONTINUED] colestipol (COLESTID) 1 g tablet Take 2 tablets (2 g total) by mouth 2 (two) times daily.  . [DISCONTINUED] senna (SENOKOT) 8.6 MG TABS tablet Take 2 tablets (17.2 mg total) by mouth 2 (two) times daily.   Facility-Administered Medications Prior to Visit  Medication Dose Route Frequency Provider  . 0.9 %  sodium chloride infusion  500 mL Intravenous Once Thornton Park, MD  . 0.9 %  sodium chloride infusion  500 mL Intravenous Once Thornton Park, MD    Review of Systems  HENT:  Negative for hearing loss.   Eyes:  Negative for blurred vision.  Respiratory:  Negative for shortness of breath.   Cardiovascular:  Negative for chest pain.  Gastrointestinal: Negative.   Genitourinary: Negative.   Musculoskeletal:  Negative for back pain.  Neurological:  Negative for headaches.  Psychiatric/Behavioral:  Negative for depression.   All other systems reviewed and are negative.         Objective:     BP 110/78 (BP Location: Left Arm, Patient Position: Sitting, Cuff Size: Large)   Pulse 77   Temp (!) 97.5 F (36.4 C) (Oral)   Ht '5\' 2"'$  (1.575 m)   Wt 183 lb 14.4 oz (83.4 kg)   SpO2 98%   BMI 33.64 kg/m  {Vitals History (Optional):23777}  Physical Exam Vitals reviewed.  Constitutional:      Appearance: Normal appearance. She is well-groomed. She is obese.  Eyes:     Conjunctiva/sclera: Conjunctivae normal.  Neck:     Thyroid: No thyromegaly.  Cardiovascular:     Rate and Rhythm: Normal rate and regular rhythm.     Pulses: Normal pulses.      Heart sounds: S1 normal and S2 normal.  Pulmonary:     Effort: Pulmonary effort is normal.     Breath sounds: Normal breath sounds and air entry.  Abdominal:     General: Bowel sounds are normal.  Musculoskeletal:     Right lower leg: No edema.     Left lower leg: No edema.  Neurological:     Mental Status: She is alert and oriented to person, place, and time. Mental status is at baseline.     Gait: Gait is intact.  Psychiatric:  Mood and Affect: Mood and affect normal.        Speech: Speech normal.        Behavior: Behavior normal.        Judgment: Judgment normal.     No results found for any visits on 04/23/22. {Show previous labs (optional):23779}    Assessment & Plan:    Routine Health Maintenance and Physical Exam  Immunization History  Administered Date(s) Administered  . Fluad Quad(high Dose 65+) 02/28/2021  . Influenza Split 04/26/2011  . Influenza Whole 03/18/2009, 03/18/2010, 03/18/2016  . Influenza,inj,Quad PF,6+ Mos 04/19/2014, 02/28/2017, 03/06/2018, 02/13/2019, 03/04/2020  . Influenza-Unspecified 02/22/2022  . PFIZER(Purple Top)SARS-COV-2 Vaccination 09/04/2019, 09/29/2019, 04/06/2020, 04/09/2021  . PNEUMOCOCCAL CONJUGATE-20 05/10/2021  . [place] Pediatric Vaccine(22mo to <538yr 03/17/2022  . Pneumococcal Conjugate-13 03/10/2015  . Pneumococcal Polysaccharide-23 06/18/2005  . Respiratory Syncytial Virus Vaccine,Recomb Aduvanted(Arexvy) 03/08/2022  . Tdap 12/04/2016  . Zoster Recombinat (Shingrix) 01/01/2018, 06/22/2018    Health Maintenance  Topic Date Due  . Medicare Annual Wellness (AWV)  04/11/2022  . COVID-19 Vaccine (6 - Pfizer series) 07/17/2022  . COLONOSCOPY (Pts [id number] coverage will need to be confirmed)  04/10/2023  . MAMMOGRAM  09/14/2023  . DEXA SCAN  09/14/2023  . TETANUS/TDAP  12/05/2026  . Pneumonia Vaccine 65+58ears old  Completed  . INFLUENZA VACCINE  Completed  . Hepatitis C Screening  Completed   . Zoster Vaccines- Shingrix  Completed  . HPV VACCINES  Aged Out    Discussed health benefits of physical activity, and encouraged her to engage in regular exercise appropriate for her age and condition.  Problem List Items Addressed This Visit       Other   Hyperlipidemia - Primary   Relevant Orders   Lipid Panel   Fatigue   Relevant Orders   TSH   Return in about 6 months (around 10/22/2022) for follow up on medical issues.     BarFarrel ConnersD

## 2022-04-25 DIAGNOSIS — Z0000 Encounter for general adult medical examination without abnormal findings: Secondary | ICD-10-CM | POA: Insufficient documentation

## 2022-04-25 NOTE — Assessment & Plan Note (Signed)
Needs new lipid panel today, she is allergic to statins. She has not been tried on the injections for high cholesterol. Will wait to see what her lipid panel looks like today.

## 2022-04-25 NOTE — Assessment & Plan Note (Signed)
Chronic, on going, although she appears improved today since her last visit, will recheck TSH today.

## 2022-05-01 IMAGING — MG MM DIGITAL SCREENING BILAT W/ TOMO AND CAD
8 series · 8 of 24 positions shown · non-contrast
Comparison: Previous exam(s).

CLINICAL DATA: Screening.

EXAM:
DIGITAL SCREENING BILATERAL MAMMOGRAM WITH TOMOSYNTHESIS AND CAD
TECHNIQUE: Bilateral screening digital craniocaudal and mediolateral oblique
mammograms were obtained. Bilateral screening digital breast
tomosynthesis was performed. The images were evaluated with
computer-aided detection.

[R MLO synth-2D]
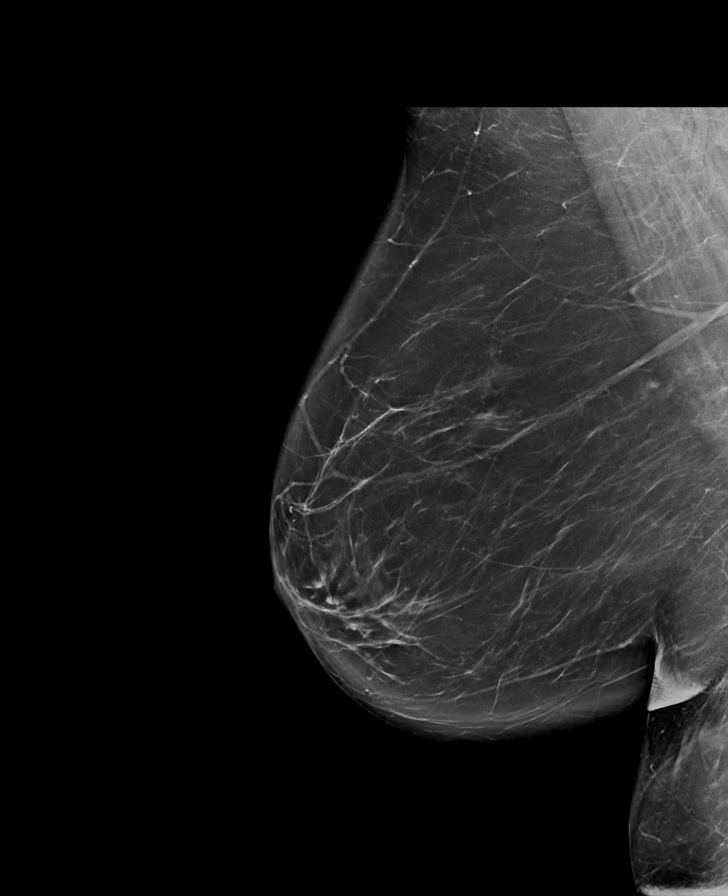

[L CC synth-2D]
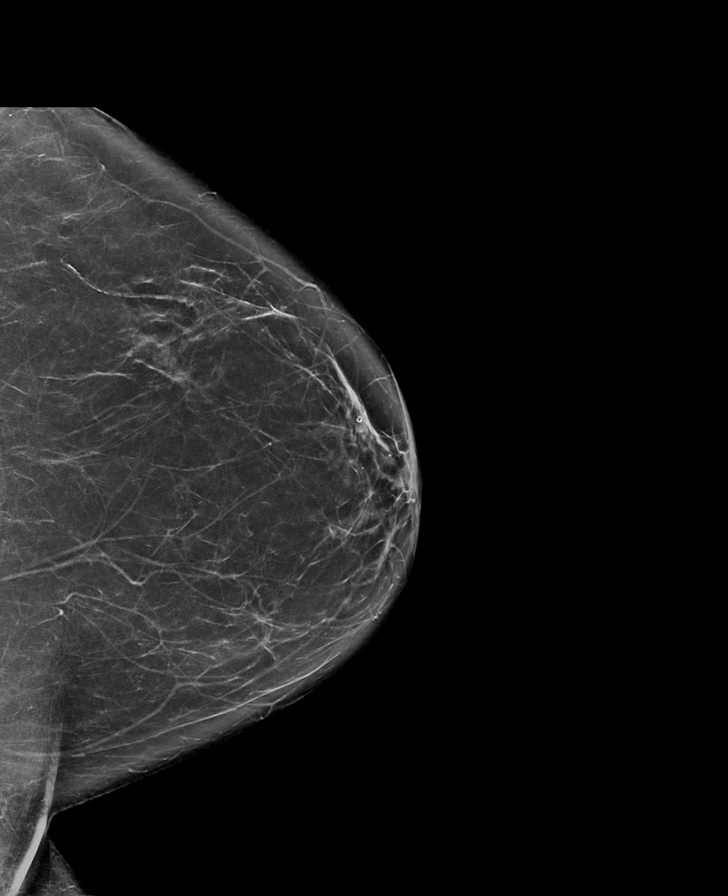

[L MLO synth-2D]
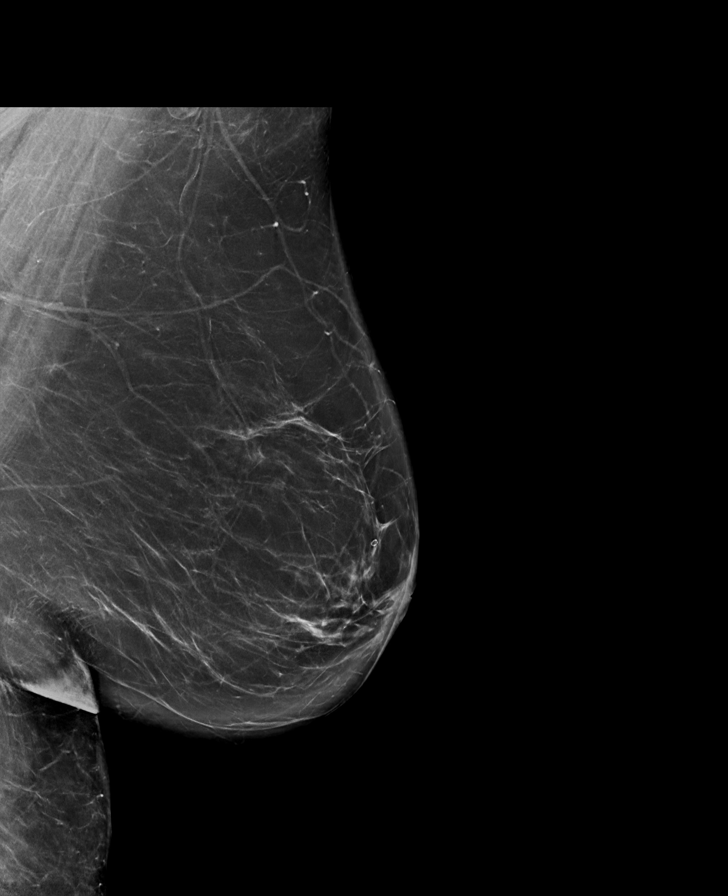

[R CC synth-2D]
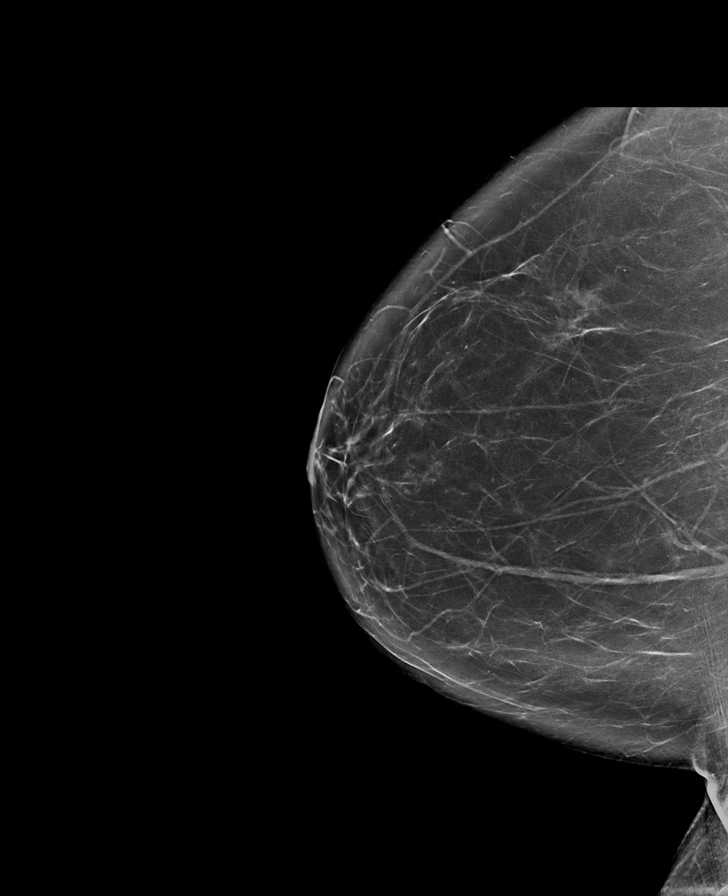

[L CC tomo · tomo slice 39/77.0]
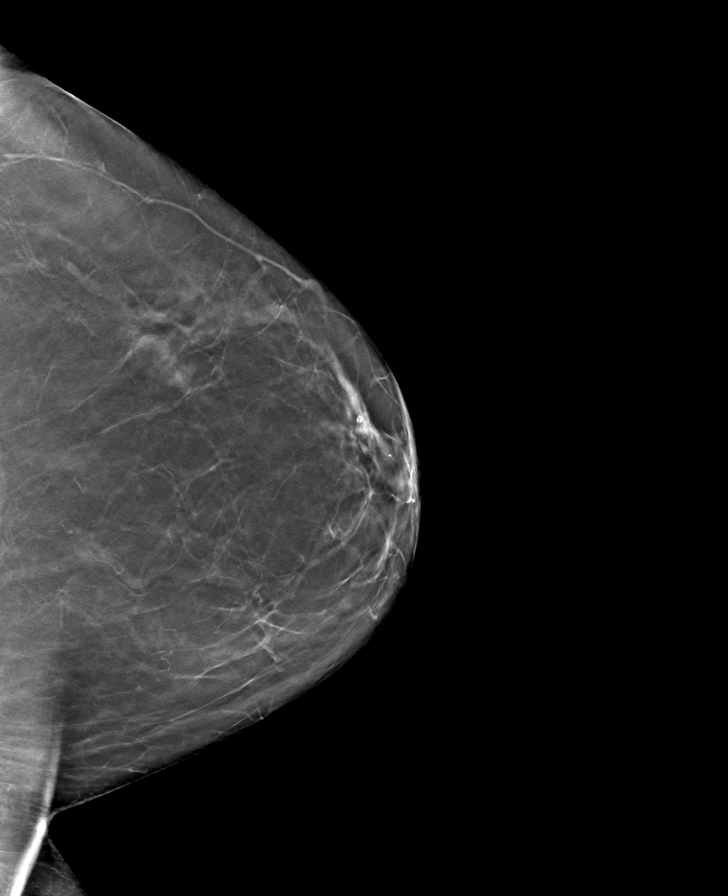

[L MLO tomo · tomo slice 48/95.0]
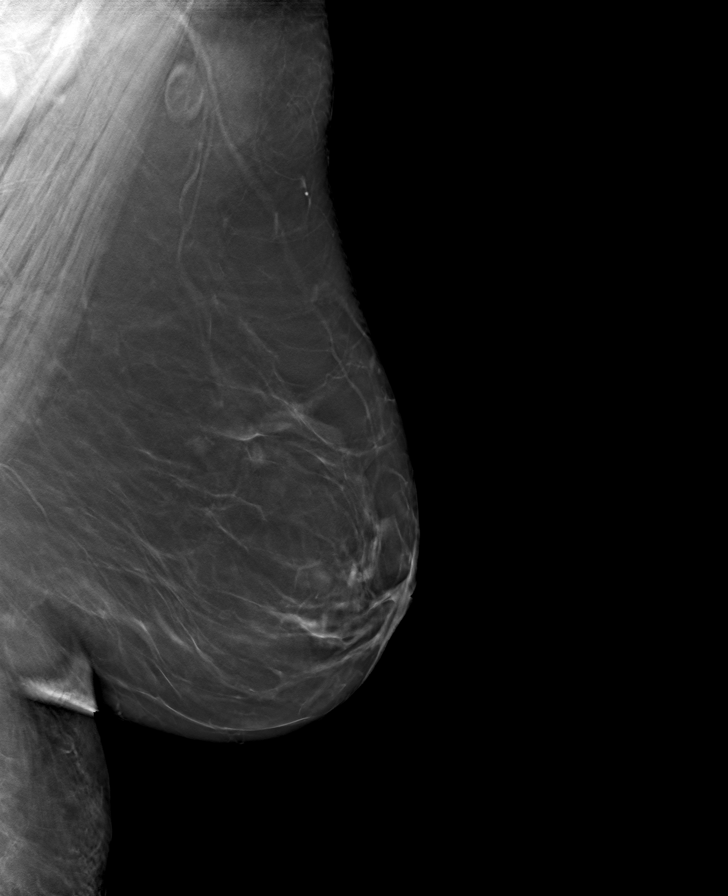

[R CC tomo · tomo slice 41/82.0]
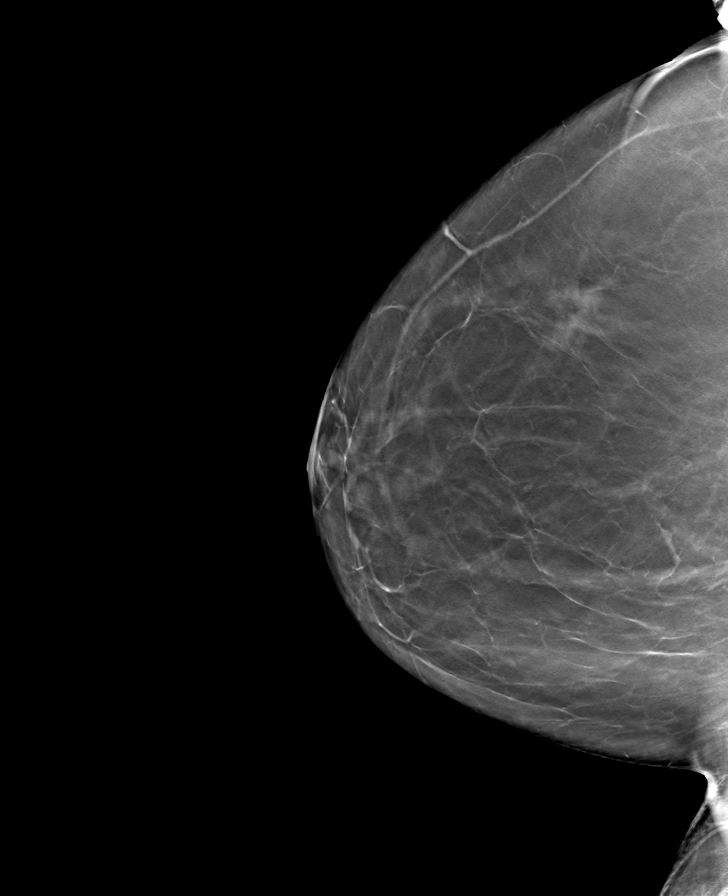

[R MLO tomo · tomo slice 47/92.0]
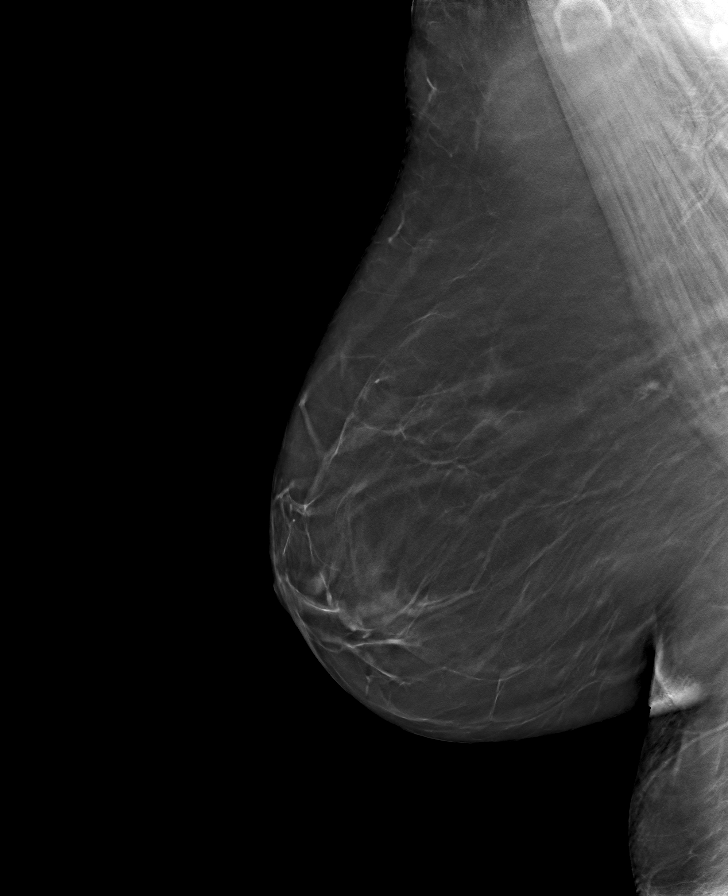

[8 of 24 positions shown; findings below may reference images not displayed]

ACR Breast Density Category b: There are scattered areas of
fibroglandular density.
FINDINGS: There are no findings suspicious for malignancy.
IMPRESSION: No mammographic evidence of malignancy. A result letter of this
screening mammogram will be mailed directly to the patient.

RECOMMENDATION:
Screening mammogram in one year. (Code:51-O-LD2)

BI-RADS CATEGORY  1: Negative.

## 2022-05-04 ENCOUNTER — Telehealth: Payer: Self-pay | Admitting: Pharmacist

## 2022-05-04 NOTE — Chronic Care Management (AMB) (Signed)
Chronic Care Management Pharmacy Assistant   Name: Brittany [name]  MRN: [id number] DOB: 03-04-1956  Reason for Encounter: Disease State / General Assessment Call   Conditions to be addressed/monitored: HLD, COPD, GERD, and Osteopenia  Recent office visits:  04/23/2022 [name] MD - Patient was seen for mixed hyperlipidemia and additional issues. Discontinued Azithromycin, Colestipol and Sennosides. Follow up in 6 months.   Recent consult visits:  04/06/2022 Geraldo Pitter NP (pulmonary) - Patient was seen for moderate persistent asthma without complication and additional concerns. Started Azithromycin and Mucinex D. Discontinued Symbicort. Follow up in 6-8 weeks.   03/28/2022 Mechele Claude PA-C [facility]) - Patient was seen for metatarsalgia of left foot. No additional chart notes.   02/28/2022 Lyn Records MD ([facility]) - Patient was seen for metatarsalgia of left foot and follow up orthopedic assessment. No additional chart notes.   02/12/2022 Chales Abrahams MD (GI) - Patient was seen for Irritable bowel syndrome with diarrhea. Trial of rifaximin for IBS-D and/or SIBO. No follow up notes.    02/07/2022 Mechele Claude PA-C [facility]) - Patient was seen for Postoperative care and Hammer toe. No additional chart notes.   01/31/2022 Mechele Claude PA-C [facility]) - Patient was seen for Postoperative care and Hammer toe. No additional chart notes.   01/25/2022 Trula Slade MD (psychiatry) - Patient was seen for Bipolar 1 disorder, mixed, moderate and additional concerns. No medication changes. Follow up in 3-4 months.   Hospital visits:  None  Medications: Outpatient Encounter Medications as of 05/04/2022  Medication Sig   albuterol (PROVENTIL) (2.5 MG/3ML) 0.083% nebulizer solution Take 3 mLs (2.5 mg total) by nebulization every 6 (six) hours as needed for wheezing or shortness of breath.   Albuterol Sulfate (PROAIR RESPICLICK) 284 (90 Base) MCG/ACT AEPB Inhale 2 puffs into  the lungs every 6 (six) hours as needed.   Azelastine HCl 137 MCG/SPRAY SOLN SPRAY ONE SPRAY IN EACH NOSTRIL TWICE DAILY AS DIRECTED   b complex vitamins capsule Take 1 capsule by mouth daily.   busPIRone (BUSPAR) 15 MG tablet Take 1 tablet (15 mg total) by mouth 3 (three) times daily.   cetirizine (ZYRTEC) 10 MG tablet Take 1 tablet (10 mg total) by mouth daily.   Cholecalciferol (VITAMIN D-3 PO) Take 10,000 Units by mouth daily.    COLLAGEN PO Take by mouth.   diazepam (VALIUM) 5 MG tablet Take 1 tablet (5 mg total) by mouth every 6 (six) hours as needed. for anxiety   dicyclomine (BENTYL) 20 MG tablet TAKE ONE TABLET BY MOUTH FOUR TIMES A DAY AS NEEDED FOR SPASMS   docusate sodium (COLACE) 100 MG capsule Take 1 capsule (100 mg total) by mouth 2 (two) times daily. While taking narcotic pain medicine.   doxazosin (CARDURA) 4 MG tablet TAKE TWO TABLETS BY MOUTH EVERY NIGHT AT BEDTIME   famotidine (PEPCID) 20 MG tablet Take 1 tablet (20 mg total) by mouth 2 (two) times daily. (Patient taking differently: Take 20 mg by mouth as needed.)   ferrous sulfate 325 (65 FE) MG EC tablet Take 325 mg by mouth 2 (two) times a week.   fluticasone (FLONASE) 50 MCG/ACT nasal spray Place 2 sprays into both nostrils daily.   MAGNESIUM PO Calcium citrate, magnesium and vitamin D3 combo   mirtazapine (REMERON) 45 MG tablet Take 1 tablet (45 mg total) by mouth at bedtime.   montelukast (SINGULAIR) 10 MG tablet TAKE ONE TABLET BY MOUTH EVERY NIGHT AT BEDTIME   Multiple Vitamin (MULTIVITAMIN  WITH MINERALS) TABS tablet Take 1 tablet by mouth daily.   pantoprazole (PROTONIX) 40 MG tablet TAKE ONE TABLET BY MOUTH TWICE A DAY   pseudoephedrine-guaifenesin (MUCINEX D) 60-600 MG 12 hr tablet Take 1 tablet by mouth every 12 (twelve) hours.   Red Yeast Rice 600 MG CAPS Take 1 capsule by mouth daily.   sodium chloride (OCEAN) 0.65 % SOLN nasal spray Place 1 spray into both nostrils as needed for congestion.    Facility-Administered Encounter Medications as of 05/04/2022  Medication   0.9 %  sodium chloride infusion   0.9 %  sodium chloride infusion  Fill History:   Dispensed Days Supply Quantity Provider Pharmacy  ALBUTEROL SULFATE 2.5 MG /3 ML (0.083 %) VIAL, NEBULIZER (ML) 08/26/2021 6 75 each      Dispensed Days Supply Quantity Provider Pharmacy  AZELASTINE 137 MCG (0.1 %) AEROSOL, SPRAY WITH PUMP (ML) 08/28/2021 50 30 mL      Dispensed Days Supply Quantity Provider Pharmacy  busPIRone (BUSPAR) tablet 02/07/2022 90 270      Dispensed Days Supply Quantity Provider Pharmacy  diazepam (VALIUM) tablet 03/12/2022 30 120      Dispensed Days Supply Quantity Provider Pharmacy  dicyclomine (BENTYL) 20 mg tablet 11/27/2021 90 360      Dispensed Days Supply Quantity Provider Pharmacy  DOXAZOSIN MESYLATE '4MG'$  TABLET 04/17/2021 90 180 tablet      Dispensed Days Supply Quantity Provider Pharmacy  FAMOTIDINE '40MG'$  TAB 06/16/2019 30 30 each      Dispensed Days Supply Quantity Provider Pharmacy  FLUTICASONE PROPIONATE 50MCG/ACT SUSPENSION 03/16/2020 90 96 each  [facility]...  FLUTICASONE 50MCG   SPR 06/16/2019 30 16 g      Dispensed Days Supply Quantity Provider Pharmacy  mirtazapine (REMERON) tablet 03/01/2022 30 30      Dispensed Days Supply Quantity Provider Pharmacy  montelukast (SINGULAIR) tablet 10 mg 07/10/2021 90 90      Dispensed Days Supply Quantity Provider Pharmacy  PANTOPRAZOLE 40 MG TABLET, DELAYED RELEASE (ENTERIC COATED) 12/13/2021 90 180 tablet     Contacted Lady Deutscher for General Review Call  Chart Review:  Have there been any documented new, changed, or discontinued medications since last visit? 04/23/2022 discontinued Azithromycin, Colestipol and Sennosides. 04/06/2022 discontinued Symbicort   Has there been any documented recent hospitalizations or ED visits since last visit with Clinical Pharmacist? No recent hospital visits.   Disease State  Questions:  Able to connect with Patient? Yes  Did patient have any problems with their health recently? Patient denies any new health issues.   Have you had any visits with new specialists or providers since your last visit? Patient denies any new health providers.   Have you had any new health care problem(s) since your last visit? Patient denies any new health care problems.   Have you run out of any of your medications since you last spoke with clinical pharmacist? Patient denies running out of any of her medications.   Are there any medications you are not taking as prescribed? Patient was started on Atorvastatin 20 mg qd by Dr. Kyla Balzarine office, she has had to cut the dose due to 10 mg qd due to side effects.  Are you having any issues or side effects with your medications? Atorvastatin causes her to have emotional side effects, since cutting the dose to 10 mg the symptoms have improved and are very mild. She states Dr. Kyla Balzarine office is aware of this.   Care Gaps: AWV - scheduled 05/24/2022  Last BP - 110/78 on 04/23/2022 Last A1C - 5.1 on 01/08/2022 AWV - overdue - scheduled 05/24/22  Star Rating Drugs: None  [place]  Clinical Pharmacist Assistant [phone]

## 2022-05-17 ENCOUNTER — Other Ambulatory Visit: Payer: Self-pay | Admitting: Psychiatry

## 2022-05-19 ENCOUNTER — Other Ambulatory Visit: Payer: Self-pay | Admitting: Pulmonary Disease

## 2022-05-23 ENCOUNTER — Encounter: Payer: Self-pay | Admitting: Pulmonary Disease

## 2022-05-23 ENCOUNTER — Ambulatory Visit (INDEPENDENT_AMBULATORY_CARE_PROVIDER_SITE_OTHER): Payer: PPO | Admitting: Pulmonary Disease

## 2022-05-23 VITALS — BP 128/84 | HR 93 | Ht 61.0 in | Wt 185.4 lb

## 2022-05-23 DIAGNOSIS — J4540 Moderate persistent asthma, uncomplicated: Secondary | ICD-10-CM | POA: Diagnosis not present

## 2022-05-23 NOTE — Patient Instructions (Addendum)
We will check with our pharmacy team about which inhalers are covered by your insurance plan.  Continue symbicort 2 puffs twice daily - rinse mouth out after each use.   Continue as needed albuterol  Continue zyrtec and nasal sprays for allergies  Follow up in 6 months

## 2022-05-23 NOTE — Progress Notes (Signed)
Synopsis: Referred in 2018 for asthma by Farrel Conners, MD.  Previously patient of Dr. Melvyn Novas and Dr. Gwenette Greet.  Subjective:   PATIENT ID: Brittany [name] GENDER: female DOB: [birth year], MRN: [id number]  Chief Complaint  Patient presents with   Follow-up   HPI Brittany [name] is a 66 year old woman, former smoker with covid 19 pneumonia in 2020 and asthma who returns to pulmonary clinic for follow up.   She was treated for asthma exacerbation in 04/06/22 with azithromycin and depomedrol injection.  She has been doing ok since her exacerbation.  [employer] is not covering symbicort this coming year but still qualifies for patient assistance.   OV 11/15/21 She has done well since last visit. She is using symbicort 160-4.44mg 2 puffs twice daily and as needed albuterol.   No issues with allergies currently. She remains on zyrtec, singulair, flonase and astelin.   Constant post nasal drainage and congestion in her throat  Lab Results  Component Value Date   NITRICOXIDE 14 07/16/2016    Past Medical History:  Diagnosis Date   Abnormal EKG    Allergy    Anxiety    Asthma    Bipolar depression ([place], Anxiety, PTSD, Panic disorder    -managed by [facility]   Depression    Foot pain    GERD (gastroesophageal reflux disease)    Heart murmur    as a child   Insomnia    Leg swelling    Osteoporosis    osteopenia   Pneumonia due to COVID-19 virus 04/22/2019   Tachycardia      Family History  Problem Relation Age of Onset   Heart failure Mother    Hypertension Mother    Hyperlipidemia Mother    Congestive Heart Failure Mother    Other Father        killed   COPD Paternal A78       a lot of aunts and uncle COPD or Emphysema   Emphysema Paternal Uncle    Colon polyps Maternal Aunt    COPD Brother    Heart disease Brother    Pulmonary embolism Brother    Breast cancer Neg Hx    Colon cancer Neg Hx    Esophageal cancer Neg Hx    Pancreatic cancer Neg Hx     Stomach cancer Neg Hx    Liver disease Neg Hx    Rectal cancer Neg Hx      Past Surgical History:  Procedure Laterality Date   BREAST BIOPSY Right    biopsy of nipple   CARPAL TUNNEL RELEASE Left    CESAREAN SECTION     x 2   CHOLECYSTECTOMY     COLONOSCOPY     ENDOMETRIAL ABLATION     EXCISION MORTON'S NEUROMA Left 01/11/2022   Procedure: SECOND WEBSPACE MORTON'S NEUROMA EXCISION;  Surgeon: HWylene Simmer MD;  [facility]  Service: Orthopedics;  Laterality: Left;   FOOT SURGERY Left    HAMMER TOE SURGERY Left 01/11/2022   Procedure: COLLATERAL LIGAMENT REPAIRS WITH SECOND AND THIRD HAMMER TOE CORRECTION;  Surgeon: HWylene Simmer MD;  [facility]  Service: Orthopedics;  Laterality: Left;   KNEE SURGERY Right    reconstruction for patellar dislocation   LAPAROSCOPY     x 2   TOENAIL EXCISION Left 01/11/2022   Procedure: HALLUX TOENAIL PERMANENT EXCISION;  Surgeon: HWylene Simmer MD;  [facility]  Service: Orthopedics;  Laterality:  Left;   TUBAL LIGATION     ULNAR NERVE REPAIR Left    UPPER GASTROINTESTINAL ENDOSCOPY     WEIL OSTEOTOMY Left 01/11/2022   Procedure: SECOND, THIRD AND FOURTH WEIL OSTEOTOMIES;  Surgeon: Wylene Simmer, MD;  Location: [facility];  Service: Orthopedics;  Laterality: Left;    Social History   Socioeconomic History   Marital status: Single    Spouse name: Not on file   Number of children: 3   Years of education: Not on file   Highest education level: Not on file  Occupational History   Occupation: disabled  Tobacco Use   Smoking status: Former    Packs/day: 20.00    Years: 1.00    Total pack years: 20.00    Types: Cigarettes    Quit date: 06/19/1999    Years since quitting: 22.9   Smokeless tobacco: Never   Tobacco comments:    20 year estimate but probably less   Vaping Use   Vaping Use: Never used  Substance and Sexual Activity   Alcohol use: Yes     Alcohol/week: 0.0 standard drinks of alcohol    Comment: occ   Drug use: No   Sexual activity: Never  Other Topics Concern   Not on file  Social History Narrative   Work or School: Disabled seconndary to psychiatric conditions      Home Situation: lives alone with 2 cats and one dog      Spiritual Beliefs: Christian      Lifestyle: no regular exercise, diet  Not great - wants to embark on healthier lifestyle   Social Determinants of Health   Financial Resource Strain: Low Risk  (05/24/2022)   Overall Financial Resource Strain (CARDIA)    Difficulty of Paying Living Expenses: Not hard at all  Food Insecurity: No Food Insecurity (05/24/2022)   Hunger Vital Sign    Worried About Running Out of Food in the Last Year: Never true    Independence in the Last Year: Never true  Transportation Needs: No Transportation Needs (05/24/2022)   PRAPARE - Hydrologist (Medical): No    Lack of Transportation (Non-Medical): No  Physical Activity: Insufficiently Active (05/24/2022)   Exercise Vital Sign    Days of Exercise per Week: 5 days    Minutes of Exercise per Session: 20 min  Stress: Stress Concern Present (05/24/2022)   [place]    Feeling of Stress : To some extent  Social Connections: Moderately Integrated (05/24/2022)   Social Connection and Isolation Panel [NHANES]    Frequency of Communication with Friends and Family: More than three times a week    Frequency of Social Gatherings with Friends and Family: More than three times a week    Attends Religious Services: More than 4 times per year    Active Member of [employer] or Organizations: Yes    Attends Music therapist: More than 4 times per year    Marital Status: Divorced  Intimate Partner Violence: Not At Risk (05/24/2022)   Humiliation, Afraid, Rape, and Kick questionnaire    Fear of Current or Ex-Partner: No    Emotionally  Abused: No    Physically Abused: No    Sexually Abused: No     Allergies  Allergen Reactions   Aspirin Other (See Comments)    discomfort   Atorvastatin     Patient states she felt suicidal  Codeine Itching   Crestor [Rosuvastatin]     Dizzy, leg pain and states she felt suicidal   Gabapentin    Niacin And Related Itching   Sulfa Antibiotics     Other reaction(s): Unknown   Sulfamethoxazole-Trimethoprim Other (See Comments)    Unknown reaction per pt   Topiramate Other (See Comments)    Per patient, it causes stomach burning   Trileptal [Oxcarbazepine]     itching   Trimethoprim Other (See Comments)    Unknown, per pt   Ultram [Tramadol] Itching   Vioxx [Rofecoxib]     Unknown per pt     Immunization History  Administered Date(s) Administered   Fluad Quad(high Dose 65+) 02/28/2021   Influenza Split 04/26/2011   Influenza Whole 03/18/2009, 03/18/2010, 03/18/2016   Influenza,inj,Quad PF,6+ Mos 04/19/2014, 02/28/2017, 03/06/2018, 02/13/2019, 03/04/2020   Influenza-Unspecified 02/22/2022   PFIZER(Purple Top)SARS-COV-2 Vaccination 09/04/2019, 09/29/2019, 04/06/2020, 04/09/2021   PNEUMOCOCCAL CONJUGATE-20 05/10/2021   [place] Covid Bivalent Pediatric Vaccine(66mo to <563yr 03/17/2022   Pneumococcal Conjugate-13 03/10/2015   Pneumococcal Polysaccharide-23 06/18/2005   Respiratory Syncytial Virus Vaccine,Recomb Aduvanted(Arexvy) 03/08/2022   Tdap 12/04/2016   Zoster Recombinat (Shingrix) 01/01/2018, 06/22/2018    Outpatient Medications Prior to Visit  Medication Sig Dispense Refill   albuterol (PROVENTIL) (2.5 MG/3ML) 0.083% nebulizer solution Take 3 mLs (2.5 mg total) by nebulization every 6 (six) hours as needed for wheezing or shortness of breath. 75 mL 12   Albuterol Sulfate (PROAIR RESPICLICK) 1026390 Base) MCG/ACT AEPB Inhale 2 puffs into the lungs every 6 (six) hours as needed. 1 each 6   Azelastine HCl 137 MCG/SPRAY SOLN SPRAY 1 SPRAY IN EACH NOSTRIL TWICE DAILY AS  DIRECTED 30 mL 1   b complex vitamins capsule Take 1 capsule by mouth daily.     busPIRone (BUSPAR) 15 MG tablet Take 1 tablet (15 mg total) by mouth 3 (three) times daily. 270 tablet 1   cetirizine (ZYRTEC) 10 MG tablet Take 1 tablet (10 mg total) by mouth daily. 90 tablet 1   Cholecalciferol (VITAMIN D-3 PO) Take 10,000 Units by mouth daily.      COLLAGEN PO Take by mouth.     diazepam (VALIUM) 5 MG tablet Take 1 tablet (5 mg total) by mouth every 6 (six) hours as needed. for anxiety 120 tablet 5   dicyclomine (BENTYL) 20 MG tablet TAKE ONE TABLET BY MOUTH FOUR TIMES A DAY AS NEEDED FOR SPASMS 360 tablet 3   docusate sodium (COLACE) 100 MG capsule Take 1 capsule (100 mg total) by mouth 2 (two) times daily. While taking narcotic pain medicine. (Patient not taking: Reported on 05/24/2022) 30 capsule 0   doxazosin (CARDURA) 4 MG tablet TAKE TWO TABLETS BY MOUTH EVERY NIGHT AT BEDTIME (Patient not taking: Reported on 05/24/2022) 180 tablet 0   famotidine (PEPCID) 20 MG tablet Take 1 tablet (20 mg total) by mouth 2 (two) times daily. (Patient taking differently: Take 20 mg by mouth as needed.) 180 tablet 3   ferrous sulfate 325 (65 FE) MG EC tablet Take 325 mg by mouth 2 (two) times a week.     fluticasone (FLONASE) 50 MCG/ACT nasal spray Place 2 sprays into both nostrils daily. 96 mL 2   MAGNESIUM PO Calcium citrate, magnesium and vitamin D3 combo     mirtazapine (REMERON) 45 MG tablet TAKE 1 TABLET BY MOUTH AT BEDTIME 30 tablet 2   montelukast (SINGULAIR) 10 MG tablet TAKE ONE TABLET BY MOUTH EVERY NIGHT AT BEDTIME 90 tablet 2  Multiple Vitamin (MULTIVITAMIN WITH MINERALS) TABS tablet Take 1 tablet by mouth daily.     pantoprazole (PROTONIX) 40 MG tablet TAKE ONE TABLET BY MOUTH TWICE A DAY 180 tablet 3   pseudoephedrine-guaifenesin (MUCINEX D) 60-600 MG 12 hr tablet Take 1 tablet by mouth every 12 (twelve) hours. 15 tablet 0   Red Yeast Rice 600 MG CAPS Take 1 capsule by mouth daily.     sodium  chloride (OCEAN) 0.65 % SOLN nasal spray Place 1 spray into both nostrils as needed for congestion.     Facility-Administered Medications Prior to Visit  Medication Dose Route Frequency Provider Last Rate Last Admin   0.9 %  sodium chloride infusion  500 mL Intravenous Once Thornton Park, MD       0.9 %  sodium chloride infusion  500 mL Intravenous Once Thornton Park, MD        Review of Systems  Constitutional:  Negative for chills, diaphoresis, fever, malaise/fatigue and weight loss.  HENT:  Negative for congestion and nosebleeds.   Eyes: Negative.   Respiratory:  Positive for cough. Negative for hemoptysis, sputum production, shortness of breath and wheezing.   Cardiovascular:  Negative for chest pain, palpitations, orthopnea, claudication and leg swelling.  Gastrointestinal:  Negative for heartburn and nausea.  Genitourinary: Negative.   Musculoskeletal:  Negative for joint pain and myalgias.  Neurological:  Negative for dizziness, weakness and headaches.  Psychiatric/Behavioral: Negative.      Objective:   Vitals:   05/23/22 0957  BP: 128/84  Pulse: 93  SpO2: 96%  Weight: 185 lb 6.4 oz (84.1 kg)  Height: '5\' 1"'$  (1.549 m)   96% on  RA BMI Readings from Last 3 Encounters:  05/24/22 34.01 kg/m  05/23/22 35.03 kg/m  04/23/22 33.64 kg/m   Wt Readings from Last 3 Encounters:  05/24/22 180 lb (81.6 kg)  05/23/22 185 lb 6.4 oz (84.1 kg)  04/23/22 183 lb 14.4 oz (83.4 kg)    Physical Exam Vitals reviewed.  Constitutional:      General: She is not in acute distress.    Appearance: She is obese. She is not ill-appearing.  HENT:     Head: Normocephalic and atraumatic.  Eyes:     General: No scleral icterus. Cardiovascular:     Rate and Rhythm: Normal rate and regular rhythm.     Heart sounds: No murmur heard. Pulmonary:     Effort: Pulmonary effort is normal.     Breath sounds: Normal breath sounds. No wheezing, rhonchi or rales.  Musculoskeletal:      Cervical back: Neck supple.     Right lower leg: No edema.     Left lower leg: No edema.  Skin:    General: Skin is warm and dry.     Findings: No rash.  Neurological:     General: No focal deficit present.     Mental Status: She is alert.     Coordination: Coordination normal.    CBC    Component Value Date/Time   WBC 4.4 05/10/2021 0934   RBC 5.04 05/10/2021 0934   HGB 15.5 (H) 05/10/2021 0934   HCT 45.9 05/10/2021 0934   PLT 318.0 05/10/2021 0934   MCV 91.1 05/10/2021 0934   MCH 31.7 04/06/2020 0916   MCHC 33.8 05/10/2021 0934   RDW 14.6 05/10/2021 0934   LYMPHSABS 0.8 05/10/2021 0934   MONOABS 0.3 05/10/2021 0934   EOSABS 0.1 05/10/2021 0934   BASOSABS 0.0 05/10/2021 0934   Chest Imaging-  films reviewed: HRCT Chest 03/2020  Moderate patchy ground glass opacity, reticulation and septal thickening throughout both lungs with associated mild traction bronchiectasis and arcitectural distortion, with an upper lobe predominance, asymmetrically prominent on the left. No frank honeycombing. Previously noted acute opacities on 04/22/2019 chest CTA have improved/resolved while the fibrotic components are new.   CXR, 2 view 05/12/2019-left greater than right opacities and reticulation.  Possible posterior pleural effusion silhouetting hemidiaphragm  CTA chest 04/22/2019-left greater than right peripheral groundglass opacities.  No PE.  CXR 02/11/2020-increased interstitial markings throughout  Echocardiogram 01/26/20: LVEF 60 to 65%, no regional wall motion abnormalities.  Mild concentric LVH, moderate basal septal hypertrophy.  Normal LA.  RV with mildly reduced function, normal size.  Normal RA.  Normal valves.  Pulmonary Functions Testing Results:    Latest Ref Rng & Units 10/25/2020    9:43 AM 09/03/2019    8:41 AM  PFT Results  FVC-Pre L 2.74  2.55   FVC-Predicted Pre % 93  86   FVC-Post L 2.90  2.71   FVC-Predicted Post % 99  91   Pre FEV1/FVC % % 70  70   Post FEV1/FCV %  % 69  67   FEV1-Pre L 1.92  1.78   FEV1-Predicted Pre % 85  78   FEV1-Post L 2.00  1.83   DLCO uncorrected ml/min/mmHg 19.95  16.97   DLCO UNC% % 107  91   DLCO corrected ml/min/mmHg 19.95  16.97   DLCO COR %Predicted % 107  91   DLVA Predicted % 105  98   TLC L 5.00  4.48   TLC % Predicted % 105  94   RV % Predicted % 105  99    2021- mild obstruction, no bronchodilator reversibility.  No restriction, hyperinflation, or air trapping.  Normal diffusion.  2022 - mild obstruction present.   Assessment & Plan:     ICD-10-CM   1. Moderate persistent asthma without complication  Z61.09      Asthma -will switch to advair 230-30mg 2 puffs twice daily as she reports symbicort 160-4.552m will not be covered by patient assistance anymore. 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  - PFTs without restrictive or diffsuion defects in 08/2019 and remain stable in 2022  Allergic rhinosinusitis -Continue Zyrtec - flonase daily  - continue azelastine nasal spray daily - try saline nasal rinses daily  Follow up in 6 months  JoFreda JacksonMD [facility] Office: [phone]   Current Outpatient Medications:    albuterol (PROVENTIL) (2.5 MG/3ML) 0.083% nebulizer solution, Take 3 mLs (2.5 mg total) by nebulization every 6 (six) hours as needed for wheezing or shortness of breath., Disp: 75 mL, Rfl: 12   Albuterol Sulfate (PROAIR RESPICLICK) [id number] Base) MCG/ACT AEPB, Inhale 2 puffs into the lungs every 6 (six) hours as needed., Disp: 1 each, Rfl: 6   Azelastine HCl 137 MCG/SPRAY SOLN, SPRAY 1 SPRAY IN EACH NOSTRIL TWICE DAILY AS DIRECTED, Disp: 30 mL, Rfl: 1   b complex vitamins capsule, Take 1 capsule by mouth daily., Disp: , Rfl:    busPIRone (BUSPAR) 15 MG tablet, Take 1 tablet (15 mg total) by mouth 3 (three) times daily., Disp: 270 tablet, Rfl: 1   cetirizine (ZYRTEC) 10 MG tablet, Take 1 tablet (10  mg total) by mouth daily., Disp: 90 tablet, Rfl: 1   Cholecalciferol (VITAMIN D-3 PO), Take 10,000 Units by  mouth daily. , Disp: , Rfl:    COLLAGEN PO, Take by mouth., Disp: , Rfl:    diazepam (VALIUM) 5 MG tablet, Take 1 tablet (5 mg total) by mouth every 6 (six) hours as needed. for anxiety, Disp: 120 tablet, Rfl: 5   dicyclomine (BENTYL) 20 MG tablet, TAKE ONE TABLET BY MOUTH FOUR TIMES A DAY AS NEEDED FOR SPASMS, Disp: 360 tablet, Rfl: 3   docusate sodium (COLACE) 100 MG capsule, Take 1 capsule (100 mg total) by mouth 2 (two) times daily. While taking narcotic pain medicine. (Patient not taking: Reported on 05/24/2022), Disp: 30 capsule, Rfl: 0   doxazosin (CARDURA) 4 MG tablet, TAKE TWO TABLETS BY MOUTH EVERY NIGHT AT BEDTIME (Patient not taking: Reported on 05/24/2022), Disp: 180 tablet, Rfl: 0   famotidine (PEPCID) 20 MG tablet, Take 1 tablet (20 mg total) by mouth 2 (two) times daily. (Patient taking differently: Take 20 mg by mouth as needed.), Disp: 180 tablet, Rfl: 3   ferrous sulfate 325 (65 FE) MG EC tablet, Take 325 mg by mouth 2 (two) times a week., Disp: , Rfl:    fluticasone (FLONASE) 50 MCG/ACT nasal spray, Place 2 sprays into both nostrils daily., Disp: 96 mL, Rfl: 2   MAGNESIUM PO, Calcium citrate, magnesium and vitamin D3 combo, Disp: , Rfl:    mirtazapine (REMERON) 45 MG tablet, TAKE 1 TABLET BY MOUTH AT BEDTIME, Disp: 30 tablet, Rfl: 2   montelukast (SINGULAIR) 10 MG tablet, TAKE ONE TABLET BY MOUTH EVERY NIGHT AT BEDTIME, Disp: 90 tablet, Rfl: 2   Multiple Vitamin (MULTIVITAMIN WITH MINERALS) TABS tablet, Take 1 tablet by mouth daily., Disp: , Rfl:    pantoprazole (PROTONIX) 40 MG tablet, TAKE ONE TABLET BY MOUTH TWICE A DAY, Disp: 180 tablet, Rfl: 3   pseudoephedrine-guaifenesin (MUCINEX D) 60-600 MG 12 hr tablet, Take 1 tablet by mouth every 12 (twelve) hours., Disp: 15 tablet, Rfl: 0   Red Yeast Rice 600 MG CAPS, Take 1 capsule by mouth daily., Disp: , Rfl:    sodium  chloride (OCEAN) 0.65 % SOLN nasal spray, Place 1 spray into both nostrils as needed for congestion., Disp: , Rfl:   Current Facility-Administered Medications:    0.9 %  sodium chloride infusion, 500 mL, Intravenous, Once, Thornton Park, MD   0.9 %  sodium chloride infusion, 500 mL, Intravenous, Once, Thornton Park, MD

## 2022-05-24 ENCOUNTER — Ambulatory Visit (INDEPENDENT_AMBULATORY_CARE_PROVIDER_SITE_OTHER): Payer: PPO

## 2022-05-24 VITALS — Ht 61.0 in | Wt 180.0 lb

## 2022-05-24 DIAGNOSIS — Z0000 Encounter for general adult medical examination without abnormal findings: Secondary | ICD-10-CM

## 2022-05-24 NOTE — Patient Instructions (Addendum)
Brittany [name] , Thank you for taking time to come for your Medicare Wellness Visit. I appreciate your ongoing commitment to your health goals. Please review the following plan we discussed and let me know if I can assist you in the future.   These are the goals we discussed:  Goals       Lose weight (pt-stated)      Get healthier.      Patient Stated      Restart keto genic diet       Patient Stated      Lose weight       Patient Stated      Lose 45-50 lbs       Weight (lb) < 150 lb (68 kg)      Will start walking!  Can walk in [place] plus community Would walk at 6:30 in am and pm  Benefits of doing this weight loss, strengthening lung and heart Energy; building up metabolism  Benefit of not doing it? Losing muscle tone and weight gain More sagging skin   Motivation 1-10 it is a 4 Why isn't it a 1 or 2; because you will get up and do what you can in your apt. What is the comparison of walking vs not walking? Picture the outcome of more inelasticity;  Can be better;   Will take inhaler prior to walking; start off slow as it has hills  Keep a log daily and see how you feel at certain temps or humidity           This is a list of the screening recommended for you and due dates:  Health Maintenance  Topic Date Due   COVID-19 Vaccine (6 - 2023-24 season) 06/09/2022*   Colon Cancer Screening  04/10/2023   Medicare Annual Wellness Visit  05/25/2023   Mammogram  09/14/2023   DEXA scan (bone density measurement)  09/14/2023   DTaP/Tdap/Td vaccine (2 - Td or Tdap) 12/05/2026   Pneumonia Vaccine  Completed   Flu Shot  Completed   Hepatitis C Screening: USPSTF Recommendation to screen - Ages 18-79 yo.  Completed   Zoster (Shingles) Vaccine  Completed   HPV Vaccine  Aged Out  *Topic was postponed. The date shown is not the original due date.    Advanced directives: In Chart  Conditions/risks identified: None  Next appointment: Follow up in one year for your annual wellness  visit     Preventive Care 65 Years and Older, Female Preventive care refers to lifestyle choices and visits with your health care provider that can promote health and wellness. What does preventive care include? A yearly physical exam. This is also called an annual well check. Dental exams once or twice a year. Routine eye exams. Ask your health care provider how often you should have your eyes checked. Personal lifestyle choices, including: Daily care of your teeth and gums. Regular physical activity. Eating a healthy diet. Avoiding tobacco and drug use. Limiting alcohol use. Practicing safe sex. Taking low-dose aspirin every day. Taking vitamin and mineral supplements as recommended by your health care provider. What happens during an annual well check? The services and screenings done by your health care provider during your annual well check will depend on your age, overall health, lifestyle risk factors, and family history of disease. Counseling  Your health care provider may ask you questions about your: Alcohol use. Tobacco use. Drug use. Emotional well-being. Home and relationship well-being. Sexual activity. Eating habits. History of falls.  Memory and ability to understand (cognition). Work and work Statistician. Reproductive health. Screening  You may have the following tests or measurements: Height, weight, and BMI. Blood pressure. Lipid and cholesterol levels. These may be checked every 5 years, or more frequently if you are over 24 years old. Skin check. Lung cancer screening. You may have this screening every year starting at age 78 if you have a 30-pack-year history of smoking and currently smoke or have quit within the past 15 years. Fecal occult blood test (FOBT) of the stool. You may have this test every year starting at age 51. Flexible sigmoidoscopy or colonoscopy. You may have a sigmoidoscopy every 5 years or a colonoscopy every 10 years starting at age  68. Hepatitis C blood test. Hepatitis B blood test. Sexually transmitted disease (STD) testing. Diabetes screening. This is done by checking your blood sugar (glucose) after you have not eaten for a while (fasting). You may have this done every 1-3 years. Bone density scan. This is done to screen for osteoporosis. You may have this done starting at age 58. Mammogram. This may be done every 1-2 years. Talk to your health care provider about how often you should have regular mammograms. Talk with your health care provider about your test results, treatment options, and if necessary, the need for more tests. Vaccines  Your health care provider may recommend certain vaccines, such as: Influenza vaccine. This is recommended every year. Tetanus, diphtheria, and acellular pertussis (Tdap, Td) vaccine. You may need a Td booster every 10 years. Zoster vaccine. You may need this after age 67. Pneumococcal 13-valent conjugate (PCV13) vaccine. One dose is recommended after age 48. Pneumococcal polysaccharide (PPSV23) vaccine. One dose is recommended after age 16. Talk to your health care provider about which screenings and vaccines you need and how often you need them. This information is not intended to replace advice given to you by your health care provider. Make sure you discuss any questions you have with your health care provider. Document Released: 07/01/2015 Document Revised: 02/22/2016 Document Reviewed: 04/05/2015 Elsevier Interactive Patient Education  2017 [place] Prevention in the Home Falls can cause injuries. They can happen to people of all ages. There are many things you can do to make your home safe and to help prevent falls. What can I do on the outside of my home? Regularly fix the edges of walkways and driveways and fix any cracks. Remove anything that might make you trip as you walk through a door, such as a raised step or threshold. Trim any bushes or trees on the  path to your home. Use bright outdoor lighting. Clear any walking paths of anything that might make someone trip, such as rocks or tools. Regularly check to see if handrails are loose or broken. Make sure that both sides of any steps have handrails. Any raised decks and porches should have guardrails on the edges. Have any leaves, snow, or ice cleared regularly. Use sand or salt on walking paths during winter. Clean up any spills in your garage right away. This includes oil or grease spills. What can I do in the bathroom? Use night lights. Install grab bars by the toilet and in the tub and shower. Do not use towel bars as grab bars. Use non-skid mats or decals in the tub or shower. If you need to sit down in the shower, use a plastic, non-slip stool. Keep the floor dry. Clean up any water that spills on the floor  as soon as it happens. Remove soap buildup in the tub or shower regularly. Attach bath mats securely with double-sided non-slip rug tape. Do not have throw rugs and other things on the floor that can make you trip. What can I do in the bedroom? Use night lights. Make sure that you have a light by your bed that is easy to reach. Do not use any sheets or blankets that are too big for your bed. They should not hang down onto the floor. Have a firm chair that has side arms. You can use this for support while you get dressed. Do not have throw rugs and other things on the floor that can make you trip. What can I do in the kitchen? Clean up any spills right away. Avoid walking on wet floors. Keep items that you use a lot in easy-to-reach places. If you need to reach something above you, use a strong step stool that has a grab bar. Keep electrical cords out of the way. Do not use floor polish or wax that makes floors slippery. If you must use wax, use non-skid floor wax. Do not have throw rugs and other things on the floor that can make you trip. What can I do with my stairs? Do not  leave any items on the stairs. Make sure that there are handrails on both sides of the stairs and use them. Fix handrails that are broken or loose. Make sure that handrails are as long as the stairways. Check any carpeting to make sure that it is firmly attached to the stairs. Fix any carpet that is loose or worn. Avoid having throw rugs at the top or bottom of the stairs. If you do have throw rugs, attach them to the floor with carpet tape. Make sure that you have a light switch at the top of the stairs and the bottom of the stairs. If you do not have them, ask someone to add them for you. What else can I do to help prevent falls? Wear shoes that: Do not have high heels. Have rubber bottoms. Are comfortable and fit you well. Are closed at the toe. Do not wear sandals. If you use a stepladder: Make sure that it is fully opened. Do not climb a closed stepladder. Make sure that both sides of the stepladder are locked into place. Ask someone to hold it for you, if possible. Clearly mark and make sure that you can see: Any grab bars or handrails. First and last steps. Where the edge of each step is. Use tools that help you move around (mobility aids) if they are needed. These include: Canes. Walkers. Scooters. Crutches. Turn on the lights when you go into a dark area. Replace any light bulbs as soon as they burn out. Set up your furniture so you have a clear path. Avoid moving your furniture around. If any of your floors are uneven, fix them. If there are any pets around you, be aware of where they are. Review your medicines with your doctor. Some medicines can make you feel dizzy. This can increase your chance of falling. Ask your doctor what other things that you can do to help prevent falls. This information is not intended to replace advice given to you by your health care provider. Make sure you discuss any questions you have with your health care provider. Document Released:  03/31/2009 Document Revised: 11/10/2015 Document Reviewed: 07/09/2014 Elsevier Interactive Patient Education  2017 [employer].

## 2022-05-24 NOTE — Progress Notes (Addendum)
Subjective:   Brittany [name] is a 66 y.o. female who presents for Medicare Annual (Subsequent) preventive examination.  Review of Systems    Virtual Visit via Telephone Note  I connected with  Brittany [name] on 05/24/22 at  2:30 PM EST by telephone and verified that I am speaking with the correct person using two identifiers.  Location: Patient: Home Provider: Office Persons participating in the virtual visit: patient/Nurse Health Advisor   I discussed the limitations, risks, security and privacy concerns of performing an evaluation and management service by telephone and the availability of in person appointments. The patient expressed understanding and agreed to proceed.  Interactive audio and video telecommunications were attempted between this nurse and patient, however failed, due to patient having technical difficulties OR patient did not have access to video capability.  We continued and completed visit with audio only.  Some vital signs may be absent or patient reported.   Criselda Peaches, LPN  Cardiac Risk Factors include: advanced age (>51mn, >>32women);Other (see comment), Risk factor comments: Dx COPD     Objective:    Today's Vitals   05/24/22 1441  Weight: 180 lb (81.6 kg)  Height: '5\' 1"'$  (1.549 m)   Body mass index is 34.01 kg/m.     05/24/2022    2:59 PM 01/11/2022    8:07 AM 01/04/2022    2:20 PM 04/11/2021    8:18 AM 01/25/2021    8:52 AM 04/05/2020    8:34 AM 01/22/2020    9:24 AM  Advanced Directives  Does Patient Have a Medical Advance Directive? Yes Yes Yes Yes Yes Yes Yes  Type of AParamed[place]of [place] will Healthcare Power of ANew [place] A[place]igerof A[place]Living will H[place]  Does patient want to make changes to medical advance directive? No - Patient declined No - Patient declined No - Patient declined  No - Patient declined     Copy of [place] Chart? Yes - validated most recent copy scanned in chart (See row information) No - copy requested Yes - validated most recent copy scanned in chart (See row information) Yes - validated most recent copy scanned in chart (See row information)  No - copy requested   Would patient like information on creating a medical advance directive?   No - Patient declined        Current Medications (verified) Outpatient Encounter Medications as of 05/24/2022  Medication Sig   albuterol (PROVENTIL) (2.5 MG/3ML) 0.083% nebulizer solution Take 3 mLs (2.5 mg total) by nebulization every 6 (six) hours as needed for wheezing or shortness of breath.   Albuterol Sulfate (PROAIR RESPICLICK) 1952(90 Base) MCG/ACT AEPB Inhale 2 puffs into the lungs every 6 (six) hours as needed.   Azelastine HCl 137 MCG/SPRAY SOLN SPRAY 1 SPRAY IN EACH NOSTRIL TWICE DAILY AS DIRECTED   b complex vitamins capsule Take 1 capsule by mouth daily.   busPIRone (BUSPAR) 15 MG tablet Take 1 tablet (15 mg total) by mouth 3 (three) times daily.   cetirizine (ZYRTEC) 10 MG tablet Take 1 tablet (10 mg total) by mouth daily.   Cholecalciferol (VITAMIN D-3 PO) Take 10,000 Units by mouth daily.    COLLAGEN PO Take by mouth.   diazepam (VALIUM) 5 MG tablet Take 1 tablet (5 mg total) by mouth every 6 (six) hours as needed. for anxiety   dicyclomine (BENTYL) 20  MG tablet TAKE ONE TABLET BY MOUTH FOUR TIMES A DAY AS NEEDED FOR SPASMS   docusate sodium (COLACE) 100 MG capsule Take 1 capsule (100 mg total) by mouth 2 (two) times daily. While taking narcotic pain medicine. (Patient not taking: Reported on 05/24/2022)   doxazosin (CARDURA) 4 MG tablet TAKE TWO TABLETS BY MOUTH EVERY NIGHT AT BEDTIME (Patient not taking: Reported on 05/24/2022)   famotidine (PEPCID) 20 MG tablet Take 1 tablet (20 mg total) by mouth 2 (two) times daily. (Patient taking differently: Take 20 mg by mouth as needed.)   ferrous sulfate 325 (65  FE) MG EC tablet Take 325 mg by mouth 2 (two) times a week.   fluticasone (FLONASE) 50 MCG/ACT nasal spray Place 2 sprays into both nostrils daily.   MAGNESIUM PO Calcium citrate, magnesium and vitamin D3 combo   mirtazapine (REMERON) 45 MG tablet TAKE 1 TABLET BY MOUTH AT BEDTIME   montelukast (SINGULAIR) 10 MG tablet TAKE ONE TABLET BY MOUTH EVERY NIGHT AT BEDTIME   Multiple Vitamin (MULTIVITAMIN WITH MINERALS) TABS tablet Take 1 tablet by mouth daily.   pantoprazole (PROTONIX) 40 MG tablet TAKE ONE TABLET BY MOUTH TWICE A DAY   pseudoephedrine-guaifenesin (MUCINEX D) 60-600 MG 12 hr tablet Take 1 tablet by mouth every 12 (twelve) hours.   Red Yeast Rice 600 MG CAPS Take 1 capsule by mouth daily.   sodium chloride (OCEAN) 0.65 % SOLN nasal spray Place 1 spray into both nostrils as needed for congestion.   Facility-Administered Encounter Medications as of 05/24/2022  Medication   0.9 %  sodium chloride infusion   0.9 %  sodium chloride infusion    Allergies (verified) Aspirin, Atorvastatin, Codeine, Crestor [rosuvastatin], Gabapentin, Niacin and related, Sulfa antibiotics, Sulfamethoxazole-trimethoprim, Topiramate, Trileptal [oxcarbazepine], Trimethoprim, Ultram [tramadol], and Vioxx [rofecoxib]   History: Past Medical History:  Diagnosis Date   Abnormal EKG    Allergy    Anxiety    Asthma    Bipolar depression ([place]), Anxiety, PTSD, Panic disorder    -managed by [facility]   Depression    Foot pain    GERD (gastroesophageal reflux disease)    Heart murmur    as a child   Insomnia    Leg swelling    Osteoporosis    osteopenia   Pneumonia due to COVID-19 virus 04/22/2019   Tachycardia    Past Surgical History:  Procedure Laterality Date   BREAST BIOPSY Right    biopsy of nipple   CARPAL TUNNEL RELEASE Left    CESAREAN SECTION     x 2   CHOLECYSTECTOMY     COLONOSCOPY     ENDOMETRIAL ABLATION     EXCISION MORTON'S NEUROMA Left 01/11/2022   Procedure: SECOND  WEBSPACE MORTON'S NEUROMA EXCISION;  Surgeon: Wylene Simmer, MD;  Location: [place];  Service: Orthopedics;  Laterality: Left;   FOOT SURGERY Left    HAMMER TOE SURGERY Left 01/11/2022   Procedure: COLLATERAL LIGAMENT REPAIRS WITH SECOND AND THIRD HAMMER TOE CORRECTION;  Surgeon: Wylene Simmer, MD;  Location: [place];  Service: Orthopedics;  Laterality: Left;   KNEE SURGERY Right    reconstruction for patellar dislocation   LAPAROSCOPY     x 2   TOENAIL EXCISION Left 01/11/2022   Procedure: HALLUX TOENAIL PERMANENT EXCISION;  Surgeon: Wylene Simmer, MD;  Location: [place];  Service: Orthopedics;  Laterality: Left;   TUBAL LIGATION     ULNAR NERVE REPAIR Left  UPPER GASTROINTESTINAL ENDOSCOPY     WEIL OSTEOTOMY Left 01/11/2022   Procedure: SECOND, THIRD AND FOURTH WEIL OSTEOTOMIES;  Surgeon: Wylene Simmer, MD;  Location: [place];  Service: Orthopedics;  Laterality: Left;   Family History  Problem Relation Age of Onset   Heart failure Mother    Hypertension Mother    Hyperlipidemia Mother    Congestive Heart Failure Mother    Other Father        killed   COPD Paternal 80        a lot of aunts and uncle COPD or Emphysema   Emphysema Paternal Uncle    Colon polyps Maternal Aunt    COPD Brother    Heart disease Brother    Pulmonary embolism Brother    Breast cancer Neg Hx    Colon cancer Neg Hx    Esophageal cancer Neg Hx    Pancreatic cancer Neg Hx    Stomach cancer Neg Hx    Liver disease Neg Hx    Rectal cancer Neg Hx    Social History   Socioeconomic History   Marital status: Single    Spouse name: Not on file   Number of children: 3   Years of education: Not on file   Highest education level: Not on file  Occupational History   Occupation: disabled  Tobacco Use   Smoking status: Former    Packs/day: 20.00    Years: 1.00    Total pack years: 20.00    Types: Cigarettes    Quit date: 06/19/1999     Years since quitting: 22.9   Smokeless tobacco: Never   Tobacco comments:    20 year estimate but probably less   Vaping Use   Vaping Use: Never used  Substance and Sexual Activity   Alcohol use: Yes    Alcohol/week: 0.0 standard drinks of alcohol    Comment: occ   Drug use: No   Sexual activity: Never  Other Topics Concern   Not on file  Social History Narrative   Work or School: Disabled seconndary to psychiatric conditions      Home Situation: lives alone with 2 cats and one dog      Spiritual Beliefs: Christian      Lifestyle: no regular exercise, diet  Not great - wants to embark on healthier lifestyle   Social Determinants of Health   Financial Resource Strain: Low Risk  (05/24/2022)   Overall Financial Resource Strain (CARDIA)    Difficulty of Paying Living Expenses: Not hard at all  Food Insecurity: No Food Insecurity (05/24/2022)   Hunger Vital Sign    Worried About Running Out of Food in the Last Year: Never true    [place] in the Last Year: Never true  Transportation Needs: No Transportation Needs (05/24/2022)   PRAPARE - Hydrologist (Medical): No    Lack of Transportation (Non-Medical): No  Physical Activity: Insufficiently Active (05/24/2022)   Exercise Vital Sign    Days of Exercise per Week: 5 days    Minutes of Exercise per Session: 20 min  Stress: Stress Concern Present (05/24/2022)   [place]    Feeling of Stress : To some extent  Social Connections: Moderately Integrated (05/24/2022)   Social Connection and Isolation Panel [NHANES]    Frequency of Communication with Friends and Family: More than three times a week    Frequency of Social  Gatherings with Friends and Family: More than three times a week    Attends Religious Services: More than 4 times per year    Active Member of Clubs or Organizations: Yes    Attends Music therapist:  More than 4 times per year    Marital Status: Divorced    Tobacco Counseling Counseling given: Not Answered Tobacco comments: 20 year estimate but probably less    Clinical Intake:  Pre-visit preparation completed: No  Pain : No/denies pain     BMI - recorded: 34.01 Nutritional Status: BMI > 30  Obese Nutritional Risks: None Diabetes: No  How often do you need to have someone help you when you read instructions, pamphlets, or other written materials from your doctor or pharmacy?: 1 - Never  Diabetic?  No  Interpreter Needed?: No  Information entered by :: Rolene Arbour LPN   Activities of Daily Living    05/24/2022    2:56 PM 01/11/2022    8:11 AM  In your present state of health, do you have any difficulty performing the following activities:  Hearing? 0 0  Vision? 0 0  Difficulty concentrating or making decisions? 0 0  Walking or climbing stairs? 0 0  Dressing or bathing? 0 0  Doing errands, shopping? 0   Preparing Food and eating ? N   Using the Toilet? N   In the past six months, have you accidently leaked urine? N   Do you have problems with loss of bowel control? N   Managing your Medications? N   Managing your Finances? N   Housekeeping or managing your Housekeeping? N     Patient Care Team: Farrel Conners, MD as PCP - General (Family Medicine) Josue Hector, MD as PCP - Cardiology (Cardiology) Viona Gilmore, [facility] as Pharmacist (Pharmacist)  Indicate any recent Medical Services you may have received from other than [facility] providers in the past year (date may be approximate).     Assessment:   This is a routine wellness examination for [place].  Hearing/Vision screen Hearing Screening - Comments:: Denies hearing difficulties   Vision Screening - Comments:: Wears rx glasses - up to date with routine eye exams with  Dr Laban Emperor  Dietary issues and exercise activities discussed: Exercise limited by: None identified   Goals Addressed                This Visit's Progress     Lose weight (pt-stated)        Get healthier.       Depression Screen    05/24/2022    2:53 PM 05/24/2022    2:51 PM 04/23/2022    8:52 AM 01/08/2022    1:00 PM 05/10/2021    8:01 AM 04/11/2021    8:16 AM 04/05/2020    8:26 AM  PHQ 2/9 Scores  PHQ - 2 Score 4 0 '4 6 2 1 2  '$ PHQ- 9 Score 5 0 '13 21 11  4    '$ Fall Risk    05/24/2022    2:58 PM 04/23/2022    8:52 AM 05/10/2021    7:58 AM 04/11/2021    8:19 AM 04/05/2020    8:36 AM  Fall Risk   Falls in the past year? 0 0 0 0 0  Number falls in past yr: 0 0 0 0 0  Injury with Fall? 0 0  0 0  Risk for fall due to : No Fall Risks No Fall Risks  Impaired  vision;Impaired balance/gait Impaired balance/gait;Impaired vision  Follow up Falls prevention discussed Falls evaluation completed  Falls prevention discussed Falls prevention discussed    FALL RISK PREVENTION PERTAINING TO THE HOME:  Any stairs in or around the home? No  If so, are there any without handrails? No  Home free of loose throw rugs in walkways, pet beds, electrical cords, etc? Yes  Adequate lighting in your home to reduce risk of falls? Yes   ASSISTIVE DEVICES UTILIZED TO PREVENT FALLS:  Life alert? No  Use of a cane, walker or w/c? No  Grab bars in the bathroom? Yes  Shower chair or bench in shower? No  Elevated toilet seat or a handicapped toilet? Yes   TIMED UP AND GO:  Was the test performed? No . Audio Visit  Cognitive Function:        05/24/2022    3:00 PM 04/11/2021    8:23 AM 04/05/2020    8:40 AM  6CIT Screen  What Year? 0 points 0 points 0 points  What month? 0 points 0 points 0 points  What time? 0 points 0 points   Count back from 20 0 points 0 points 0 points  Months in reverse 0 points 0 points 0 points  Repeat phrase 0 points 0 points 0 points  Total Score 0 points 0 points     Immunizations Immunization History  Administered Date(s) Administered   Fluad Quad(high Dose 65+) 02/28/2021    Influenza Split 04/26/2011   Influenza Whole 03/18/2009, 03/18/2010, 03/18/2016   Influenza,inj,Quad PF,6+ Mos 04/19/2014, 02/28/2017, 03/06/2018, 02/13/2019, 03/04/2020   Influenza-Unspecified 02/22/2022   PFIZER(Purple Top)SARS-COV-2 Vaccination 09/04/2019, 09/29/2019, 04/06/2020, 04/09/2021   PNEUMOCOCCAL CONJUGATE-20 05/10/2021   [place] Covid Bivalent Pediatric Vaccine(53mo to <531yr 03/17/2022   Pneumococcal Conjugate-13 03/10/2015   Pneumococcal Polysaccharide-23 06/18/2005   Respiratory Syncytial Virus Vaccine,Recomb Aduvanted(Arexvy) 03/08/2022   Tdap 12/04/2016   Zoster Recombinat (Shingrix) 01/01/2018, 06/22/2018    TDAP status: Up to date  Flu Vaccine status: Up to date  Pneumococcal vaccine status: Up to date  Covid-19 vaccine status: Completed vaccines  Qualifies for Shingles Vaccine? Yes   Zostavax completed Yes   Shingrix Completed?: Yes  Screening Tests Health Maintenance  Topic Date Due   COVID-19 Vaccine (6 - 2023-24 season) 06/09/2022 (Originally 05/12/2022)   COLONOSCOPY (Pts [id number] coverage will need to be confirmed)  04/10/2023   Medicare Annual Wellness (AWV)  05/25/2023   MAMMOGRAM  09/14/2023   DEXA SCAN  09/14/2023   DTaP/Tdap/Td (2 - Td or Tdap) 12/05/2026   Pneumonia Vaccine 65+66ears old  Completed   INFLUENZA VACCINE  Completed   Hepatitis C Screening  Completed   Zoster Vaccines- Shingrix  Completed   HPV VACCINES  Aged Out    Health Maintenance  There are no preventive care reminders to display for this patient.   Colorectal cancer screening: Type of screening: Colonoscopy. Completed 04/09/18. Repeat every 5 years  Mammogram status: Completed 09/13/21. Repeat every year  Bone Density status: Completed 09/13/21. Results reflect: Bone density results: OSTEOPOROSIS. Repeat every   years.  Lung Cancer Screening: (Low Dose CT Chest recommended if Age 56-6-80ars, 30 pack-year currently smoking OR have quit w/in 15years.) does  not qualify.     Additional Screening:  Hepatitis C Screening: does qualify; Completed 06/18/10  Vision Screening: Recommended annual ophthalmology exams for early detection of glaucoma and other disorders of the eye. Is the patient up to date with their annual eye exam?  Yes  Who  is the provider or what is the name of the office in which the patient attends annual eye exams? Dr Laban Emperor If pt is not established with a provider, would they like to be referred to a provider to establish care? No .   Dental Screening: Recommended annual dental exams for proper oral hygiene  Community Resource Referral / Chronic Care Management:  CRR required this visit?  No   CCM required this visit?  No      Plan:     I have personally reviewed and noted the following in the patient's chart:   Medical and social history Use of alcohol, tobacco or illicit drugs  Current medications and supplements including opioid prescriptions. Patient is not currently taking opioid prescriptions. Functional ability and status Nutritional status Physical activity Advanced directives List of other physicians Hospitalizations, surgeries, and ER visits in previous 12 months Vitals Screenings to include cognitive, depression, and falls Referrals and appointments  In addition, I have reviewed and discussed with patient certain preventive protocols, quality metrics, and best practice recommendations. A written personalized care plan for preventive services as well as general preventive health recommendations were provided to patient.     Criselda Peaches, LPN   [id number]   Nurse Notes:  Patient request f/u with personal and medical concerns.

## 2022-05-29 ENCOUNTER — Other Ambulatory Visit (HOSPITAL_COMMUNITY): Payer: Self-pay

## 2022-06-02 ENCOUNTER — Encounter: Payer: Self-pay | Admitting: Pulmonary Disease

## 2022-06-02 MED ORDER — FLUTICASONE-SALMETEROL 230-21 MCG/ACT IN AERO: 2.0000 | INHALATION_SPRAY | Freq: Two times a day (BID) | RESPIRATORY_TRACT | 12 refills | Status: DC

## 2022-06-05 ENCOUNTER — Other Ambulatory Visit (HOSPITAL_COMMUNITY): Payer: Self-pay

## 2022-06-05 NOTE — Telephone Encounter (Signed)
Generic Advair Diskus is covered.

## 2022-06-08 MED ORDER — FLUTICASONE-SALMETEROL 250-50 MCG/ACT IN AEPB: 1.0000 | INHALATION_SPRAY | Freq: Two times a day (BID) | RESPIRATORY_TRACT | 3 refills | Status: DC

## 2022-07-02 ENCOUNTER — Telehealth: Payer: Self-pay | Admitting: Psychiatry

## 2022-07-02 NOTE — Telephone Encounter (Signed)
I need to see her to address this.  Put her on cancellation list.

## 2022-07-02 NOTE — Telephone Encounter (Signed)
Patient said her anxiety is "off the chart". She said her depression is an 11/10. Having financial stressors, but doesn't verbalize any others. She said she adopted a dog and thought that might help but it isn't and she has to make herself get out of bed to take him out. She is only getting a "solid 4 hours of sleep" nightly. She can get to sleep because of all the medications she takes but tosses and turns. She does say she drinks 3 cups of coffee in the a.m. and thought that it might be causing the anxiety, though she hasn't felt like it was an issue previously. Did suggest she cut back to no more than 2 cups. When questioned admits to passive SI but said as a [place] she doesn't believe in it and has her grandchildren and dog to live for, but thinks about it constantly, no plan. She said this seems to have started when she started taking a statin, but she stopped this summer due to inability to tolerate. She was not tearful.   Meds:   Diazepam 5 mg q.6h prn Vitamin D3 Mirtazapine 45 mg QHS Buspirone 15 mg TID Doxazosin 4 mg - 8 mg QHS  She is on multiple inhalers.

## 2022-07-02 NOTE — Telephone Encounter (Signed)
Pt called needing sooner apt than 2/13. On canc list. Her anxiety if out the roof. Wanted to reach out to see if anything can be done before apt. Also, asking who CC recommends in the office for therapy. Contact # [phone]

## 2022-07-03 ENCOUNTER — Encounter: Payer: Self-pay | Admitting: Psychiatry

## 2022-07-03 ENCOUNTER — Ambulatory Visit (INDEPENDENT_AMBULATORY_CARE_PROVIDER_SITE_OTHER): Payer: PPO | Admitting: Psychiatry

## 2022-07-03 DIAGNOSIS — F515 Nightmare disorder: Secondary | ICD-10-CM | POA: Diagnosis not present

## 2022-07-03 DIAGNOSIS — F4001 Agoraphobia with panic disorder: Secondary | ICD-10-CM

## 2022-07-03 DIAGNOSIS — F3162 Bipolar disorder, current episode mixed, moderate: Secondary | ICD-10-CM | POA: Diagnosis not present

## 2022-07-03 DIAGNOSIS — F4312 Post-traumatic stress disorder, chronic: Secondary | ICD-10-CM | POA: Diagnosis not present

## 2022-07-03 DIAGNOSIS — F4310 Post-traumatic stress disorder, unspecified: Secondary | ICD-10-CM | POA: Diagnosis not present

## 2022-07-03 NOTE — Progress Notes (Signed)
Brittany [name] [id number] 12/25/1955 67 y.o.     Subjective:   Patient ID:  Brittany [name] is a 67 y.o. (DOB Jul 22, 1955) female.  Chief Complaint:  Chief Complaint  Patient presents with   Follow-up    Bipolar 1 disorder, mixed, moderate ([place])   Post-Traumatic Stress Disorder   Depression   Anxiety    Anxiety Symptoms include nervous/anxious behavior and shortness of breath. Patient reports no confusion, decreased concentration, dizziness or suicidal ideas.    Depression        Associated symptoms include fatigue.  Associated symptoms include no decreased concentration and no suicidal ideas.  Past medical history includes anxiety.    Brittany [name] presents to the office today for follow-up of long-term depression and anxiety and insomnia.    At visit September 08, 2018 at which time the patient was weaned off of gabapentin and started on Trileptal up to twice daily.  She has been very med sensitive and difficult to treat because of that.  When seen September 19, 2018.  No meds were changed.  She stopped oxcarbazapine on her own DT itching.  seen July 2020.  She did not want to try any additional medications despite ongoing depression.  She wanted to stop lithium despite risk of worsening depression.  She was given instructions about how to do it in the safest manner possible.  Last seen August 12, 2019..   Still taking diazepam 5 TID, mirtazapine 15, doxazosin increased to 8 mg nightly for nightmares from 6 mg.    10/16/2019 appointment, the following noted: NM much better with increase prazosin to 8 mg HS and anxiety better that was related and tolerated.  Overall anxiety still a problem.  depression and anxiety much worse for a week after 2nd Covid vaccine recently.   Also overall more anxious and agoraphobia is bad.  Does force herself to go to [employer] weekly.   Averaging diazepam 5 mg BID-TID daily.  No drowsiness.  Avoids more at night bc of worsening NM from it. Weird dreams  and occ awakens with panic.  Lost another "fur baby" that's 2 in 1 year early 2021.  $ and health stress.  Dreams are worse than last visit.  No SE.  Mirtazapine helps sleep generally and much better with it than without it.  Post Covid November 2020. Got significantly better in depression since here.  Prayed a lot about it and thought the lithium was helping.  Feels better now off it.  Was going through a lot at the time.  But after a month more peace and joy.   Buspirone helped.  Diazepam better than Xanax which she feels triggered dissociation and irritabilty.  Lost her dog which is hard.  Prayed and that helped.  Oldest GS joined special forces and gave her his car.  Anxiety driving but does it.   Plan: no med changes  02/09/20 appt with the following noted: Went back down on doxazosin to 4 mg bc of dizziness but it's not better.  She plans to talk to her other doctors about it.. CO anxiety and wanting to take more meds for it.  Anxiety without reason generally. Increased Diazepam from 5 mg TID to QID end May 2021.   Worsening mirtazapine also.  Has done some night eating without memory the next day. Doesn't seem to be anxiety causing insomnia.  Plan: Start topiramate off label for anxiety and weight loss at 25 mg daily and increase gradually to 100 mg  daily.  06/21/2020 appointment with the following noted: Had problems with night terrors again with stress around $ and pet sick.  Getting better again.   Still taking topiramate 100 mg started last visit. Lost weight DT stomach problems.  Doctors cannot explain.  Feels like poor absorption of nutrients.  Difficulty tolerating foods.  Dx bacterial overgrowth in small intestine but corrected. Stomach constantly burns.  Sx were present before topiramate? Anxiety is OK if stays home but is worse in public.  Meds are helping anxiety.  Not markedly depressed.  Meds are helping.  11/30/20 appt noted: Chronic anxiety averaging ? Amount daily of  diazepam.  Anxiety is higher out of the house.  Gets out of the house regularly to help care for 7 mos old GS.  D took her to beach last week.   Has 5 gallon fish tank with some guppies.   Occ night terrrors. Occ EMA Taking doxazosin 6 mg HS, 4 mg was less effective. Generally more trouble with anxiety than depres but some depression occ with $ stress and despise where she lives.   Ketogenic diet helped GI usually but some pain today.  Haunted with medical bills. Had Covid hosp 11 days and then home with O2 for a month.  Pulm said don't get Covid again.  Keeps her from going to church regularly but that would help. Plan: Also topiramate Continue buspirone 15 mg 3 times daily,  and mirtazapine 30 mg nightly for sleep.  Has been compliant with Valium.  She asks about an increase to increase.  OK continue Valium 5 mg  QID prn but take LED. Cont buspirone 15 mg TID, doxazosin 6-8 mg HS  03/29/2021 appointment with the following noted: Has aquarium. Doing OK except some seasonal depression and irritability noticed. Increased doxazosin between 6-8 mg HS.  Still having weird dreams but not having NM or night terrrors usually. No med changes  09/27/2021 appointment with the following noted: A lot better than normal Oct=Feb.  New GS, D married and some things to look forward to but still some anniversaries of losses.  Helping D with 76 mo old GS helps her a lot.  But did gain weight over the winter.  Dx CAD and put on Statin with cog and depressive and anxiety SE.  Current one not as bad but a little more anxiety and using a little more diazepam.  Nervous about people working in her appt. Going to [facility] [place] on plane for first time with her family.  Excited but nervous too.   Plan no med changes  01/25/2022 appointment noted: No concerns about psych meds except Chronic $ stress.  Has to make choices about essentials.  I put myself into dept living on credit cards. Will go to sleep but EMA 40% of time  despite mirtazapine.  Not usually a napper but on occ.    Can't tolerate statins. Even with CoQ10. Chronic med sensitivity.  Brought Genesight test. Got SI about 2-4 weeks into statin and better off it after 3-4 weeks off it. Minimal depression at this time.  Some chronic anxiety which is worse than depression. Disc problematic night eating.  07/02/2022 phone call complaining of high depression and anxiety.  Appointment was moved up.  07/03/22 appointment noted: Current psych meds diazepam 5 mg every 6 hours as needed, mirtazapine 45 mg nightly, buspirone 15 mg 3 times daily, doxazosin 4 to 8 mg nightly prn. Thinks mood got worse when on statins.  Stopped it in summer.  Then took red yeast rice and had a little with it too.   Gained wt in summer from foot surgery.  $ stress from living on credit cards. Complaining of much worse depression and anxiety.  Increased SI persistent like 10-15 years ago. Don't like crowds.  Frustrated and irritable. Thoutghts jumbled. Was at diazepam 5 mg 3 daily and now 4 daily.  At times has taken 10 mg at a time. Plans to restart therapy.  Wants Christian therapist. Chronically fearful of psych meds and doesn't want to start something extra. Still a lot of GI problems.  Worries about weight gain with meds generally fearful of meds.  Chronic $ problems.  Stopped therapy about September 2020.  Multiple med failures. , restoril, diazepam is ok. NO MORE Xanax DT dissociation and irritability,   Trazodone NR, Ambien, hyrdroxyzine, prazosin, doxazosin, propranolol, mirtazapine 45 sertraline, fluoxetine NR, Lexapro,   Buspar 15 TID, gabapentin,  Latuda, Olanzapine, risperidone with AE,   Seroquel SE, Geodon, Saphris,  Abilify, Rexulti seemed to help for short while but $,  Trileptal itching, lithium NR, CBZ SE, Lamotrigine, VPA,   Review of Systems:  Review of Systems  Constitutional:  Positive for fatigue. Negative for unexpected weight change.  HENT:   Negative for congestion.   Respiratory:  Positive for shortness of breath and wheezing.   Gastrointestinal:  Positive for abdominal distention.  Musculoskeletal:  Positive for back pain.  Neurological:  Negative for dizziness, tremors, weakness and light-headedness.  Psychiatric/Behavioral:  Positive for dysphoric mood and sleep disturbance. Negative for agitation, behavioral problems, confusion, decreased concentration, hallucinations, self-injury and suicidal ideas. The patient is nervous/anxious. The patient is not hyperactive.        Please refer to HPI  Stress eating.  Medications: I have reviewed the patient's current medications.  Current Outpatient Medications  Medication Sig Dispense Refill   albuterol (PROVENTIL) (2.5 MG/3ML) 0.083% nebulizer solution Take 3 mLs (2.5 mg total) by nebulization every 6 (six) hours as needed for wheezing or shortness of breath. 75 mL 12   Albuterol Sulfate (PROAIR RESPICLICK) 749 (90 Base) MCG/ACT AEPB Inhale 2 puffs into the lungs every 6 (six) hours as needed. 1 each 6   Azelastine HCl 137 MCG/SPRAY SOLN SPRAY 1 SPRAY IN EACH NOSTRIL TWICE DAILY AS DIRECTED 30 mL 1   b complex vitamins capsule Take 1 capsule by mouth daily.     busPIRone (BUSPAR) 15 MG tablet Take 1 tablet (15 mg total) by mouth 3 (three) times daily. 270 tablet 1   cetirizine (ZYRTEC) 10 MG tablet Take 1 tablet (10 mg total) by mouth daily. 90 tablet 1   Cholecalciferol (VITAMIN D-3 PO) Take 10,000 Units by mouth daily.      COLLAGEN PO Take by mouth.     diazepam (VALIUM) 5 MG tablet Take 1 tablet (5 mg total) by mouth every 6 (six) hours as needed. for anxiety 120 tablet 5   dicyclomine (BENTYL) 20 MG tablet TAKE ONE TABLET BY MOUTH FOUR TIMES A DAY AS NEEDED FOR SPASMS 360 tablet 3   docusate sodium (COLACE) 100 MG capsule Take 1 capsule (100 mg total) by mouth 2 (two) times daily. While taking narcotic pain medicine. 30 capsule 0   doxazosin (CARDURA) 4 MG tablet TAKE TWO  TABLETS BY MOUTH EVERY NIGHT AT BEDTIME (Patient taking differently: PRN) 180 tablet 0   famotidine (PEPCID) 20 MG tablet Take 1 tablet (20 mg total) by mouth 2 (two) times daily. (Patient taking differently: Take 20 mg  by mouth as needed.) 180 tablet 3   ferrous sulfate 325 (65 FE) MG EC tablet Take 325 mg by mouth 2 (two) times a week.     fluticasone (FLONASE) 50 MCG/ACT nasal spray Place 2 sprays into both nostrils daily. 96 mL 2   fluticasone-salmeterol (ADVAIR HFA) 230-21 MCG/ACT inhaler Inhale 2 puffs into the lungs 2 (two) times daily. 1 each 12   fluticasone-salmeterol (ADVAIR) 250-50 MCG/ACT AEPB Inhale 1 puff into the lungs every 12 (twelve) hours. 60 each 3   MAGNESIUM PO Calcium citrate, magnesium and vitamin D3 combo     montelukast (SINGULAIR) 10 MG tablet TAKE ONE TABLET BY MOUTH EVERY NIGHT AT BEDTIME 90 tablet 2   Multiple Vitamin (MULTIVITAMIN WITH MINERALS) TABS tablet Take 1 tablet by mouth daily.     pantoprazole (PROTONIX) 40 MG tablet TAKE ONE TABLET BY MOUTH TWICE A DAY 180 tablet 3   pseudoephedrine-guaifenesin (MUCINEX D) 60-600 MG 12 hr tablet Take 1 tablet by mouth every 12 (twelve) hours. 15 tablet 0   Red Yeast Rice 600 MG CAPS Take 1 capsule by mouth daily.     sodium chloride (OCEAN) 0.65 % SOLN nasal spray Place 1 spray into both nostrils as needed for congestion.     Current Facility-Administered Medications  Medication Dose Route Frequency Provider Last Rate Last Admin   0.9 %  sodium chloride infusion  500 mL Intravenous Once [place], MD       0.9 %  sodium chloride infusion  500 mL Intravenous Once [place], MD        Medication Side Effects: None  Allergies:  Allergies  Allergen Reactions   Aspirin Other (See Comments)    discomfort   Atorvastatin     Patient states she felt suicidal   Codeine Itching   Crestor [Rosuvastatin]     Dizzy, leg pain and states she felt suicidal   Gabapentin    Niacin And Related Itching   Sulfa  Antibiotics     Other reaction(s): Unknown   Sulfamethoxazole-Trimethoprim Other (See Comments)    Unknown reaction per pt   Topiramate Other (See Comments)    Per patient, it causes stomach burning   Trileptal [Oxcarbazepine]     itching   Trimethoprim Other (See Comments)    Unknown, per pt   Ultram [Tramadol] Itching   Vioxx [Rofecoxib]     Unknown per pt    Past Medical History:  Diagnosis Date   Abnormal EKG    Allergy    Anxiety    Asthma    Bipolar depression ([place]), Anxiety, PTSD, Panic disorder    -managed by [facility]   Depression    Foot pain    GERD (gastroesophageal reflux disease)    Heart murmur    as a child   Insomnia    Leg swelling    Osteoporosis    osteopenia   Pneumonia due to COVID-19 virus 04/22/2019   Tachycardia     Family History  Problem Relation Age of Onset   Heart failure Mother    Hypertension Mother    Hyperlipidemia Mother    Congestive Heart Failure Mother    Other Father        killed   COPD Paternal 46        a lot of aunts and uncle COPD or Emphysema   Emphysema Paternal Uncle    Colon polyps Maternal Aunt    COPD Brother    Heart disease Brother  Pulmonary embolism Brother    Breast cancer Neg Hx    Colon cancer Neg Hx    Esophageal cancer Neg Hx    Pancreatic cancer Neg Hx    Stomach cancer Neg Hx    Liver disease Neg Hx    Rectal cancer Neg Hx     Social History   Socioeconomic History   Marital status: Single    Spouse name: Not on file   Number of children: 3   Years of education: Not on file   Highest education level: Not on file  Occupational History   Occupation: disabled  Tobacco Use   Smoking status: Former    Packs/day: 20.00    Years: 1.00    Total pack years: 20.00    Types: Cigarettes    Quit date: 06/19/1999    Years since quitting: 23.0   Smokeless tobacco: Never   Tobacco comments:    20 year estimate but probably less   Vaping Use   Vaping Use: Never used  Substance  and Sexual Activity   Alcohol use: Yes    Alcohol/week: 0.0 standard drinks of alcohol    Comment: occ   Drug use: No   Sexual activity: Never  Other Topics Concern   Not on file  Social History Narrative   Work or School: Disabled seconndary to psychiatric conditions      Home Situation: lives alone with 2 cats and one dog      Spiritual Beliefs: Christian      Lifestyle: no regular exercise, diet  Not great - wants to embark on healthier lifestyle   Social Determinants of Health   Financial Resource Strain: Low Risk  (05/24/2022)   Overall Financial Resource Strain (CARDIA)    Difficulty of Paying Living Expenses: Not hard at all  Food Insecurity: No Food Insecurity (05/24/2022)   Hunger Vital Sign    Worried About Running Out of Food in the Last Year: Never true    [place] in the Last Year: Never true  Transportation Needs: No Transportation Needs (05/24/2022)   PRAPARE - Hydrologist (Medical): No    Lack of Transportation (Non-Medical): No  Physical Activity: Insufficiently Active (05/24/2022)   Exercise Vital Sign    Days of Exercise per Week: 5 days    Minutes of Exercise per Session: 20 min  Stress: Stress Concern Present (05/24/2022)   [place]    Feeling of Stress : To some extent  Social Connections: Moderately Integrated (05/24/2022)   Social Connection and Isolation Panel [NHANES]    Frequency of Communication with Friends and Family: More than three times a week    Frequency of Social Gatherings with Friends and Family: More than three times a week    Attends Religious Services: More than 4 times per year    Active Member of [employer] or Organizations: Yes    Attends Archivist Meetings: More than 4 times per year    Marital Status: Divorced  Intimate Partner Violence: Not At Risk (05/24/2022)   Humiliation, Afraid, Rape, and Kick questionnaire    Fear of  Current or Ex-Partner: No    Emotionally Abused: No    Physically Abused: No    Sexually Abused: No    Past Medical History, Surgical history, Social history, and Family history were reviewed and updated as appropriate.   Please see review of systems for further details on the  patient's review from today.   Objective:   Physical Exam:  There were no vitals taken for this visit.  Physical Exam Constitutional:      General: She is not in acute distress. Musculoskeletal:        General: No deformity.  Neurological:     Mental Status: She is alert and oriented to person, place, and time.     Cranial Nerves: No dysarthria.     Coordination: Coordination normal.  Psychiatric:        Attention and Perception: Attention and perception normal. She does not perceive auditory or visual hallucinations.        Mood and Affect: Mood is anxious and depressed. Affect is not labile, blunt, angry or inappropriate.        Speech: Speech normal. Speech is not rapid and pressured or slurred.        Behavior: Behavior normal. Behavior is cooperative.        Thought Content: Thought content normal. Thought content is not paranoid or delusional. Thought content does not include homicidal or suicidal ideation. Thought content does not include suicidal plan.        Cognition and Memory: Cognition and memory normal.        Judgment: Judgment normal.     Comments: Insight and judgment fair.  not overtly manic.  chronic $ stress Anxiety and depression. talkative     Lab Review:     Component Value Date/Time   NA 141 11/17/2021 0835   K 4.4 11/17/2021 0835   CL 102 11/17/2021 0835   CO2 25 11/17/2021 0835   GLUCOSE 94 11/17/2021 0835   GLUCOSE 97 05/10/2021 0934   BUN 16 11/17/2021 0835   CREATININE 0.75 11/17/2021 0835   CREATININE 0.94 04/06/2020 0916   CALCIUM 9.7 11/17/2021 0835   PROT 6.6 11/17/2021 0835   ALBUMIN 4.8 11/17/2021 0835   AST 16 11/17/2021 0835   ALT 23 11/17/2021 0835    ALKPHOS 68 11/17/2021 0835   BILITOT 0.4 11/17/2021 0835   GFRNONAA >60 05/01/2019 0155   GFRAA >60 05/01/2019 0155       Component Value Date/Time   WBC 4.4 05/10/2021 0934   RBC 5.04 05/10/2021 0934   HGB 15.5 (H) 05/10/2021 0934   HCT 45.9 05/10/2021 0934   PLT 318.0 05/10/2021 0934   MCV 91.1 05/10/2021 0934   MCH 31.7 04/06/2020 0916   MCHC 33.8 05/10/2021 0934   RDW 14.6 05/10/2021 0934   LYMPHSABS 0.8 05/10/2021 0934   MONOABS 0.3 05/10/2021 0934   EOSABS 0.1 05/10/2021 0934   BASOSABS 0.0 05/10/2021 0934    No results found for: "POCLITH", "LITHIUM"   Lab Results  Component Value Date   VALPROATE 25.3 (L) 10/26/2008     .res Assessment: Plan:    Brittany [name] was seen today for follow-up, post-traumatic stress disorder, depression and anxiety.  Diagnoses and all orders for this visit:  Bipolar 1 disorder, mixed, moderate ([facility])  PTSD (post-traumatic stress disorder)  Panic disorder with agoraphobia  Nightmares associated with chronic post-traumatic stress disorder  Chronic noncompliance complicates treatment but bettter lately Likey borderline personality disorder.  Greater than 50% of 30 min face to face time with patient was spent on counseling and coordination of care. We discussed her chronic sx of depression, anxiety and insomnia combined with medication sensitivity which makes it very difficult to treat her.  Multiple med failures.  Limited insight into bipolar vs PD and has refused mood stabilizers.  This was  discussed with her repeatedly. But she has not had manic episodes off mood stabilizers..  Her faith helps.   At the visit July 03, 2022 she is complaining of more symptoms of depression and anxiety and irritability.  She is having additional financial stress but financial stress has been chronic.  She feels like her mood worsened while on statins and has never recovered since the summer 2023 but it is progressively worse. She has failed to respond to  multiple SSRIs and other psychiatric medications.  She would like for some med change if it is possible to give her more relief.  We discussed the short-term risks associated with benzodiazepines including sedation and increased fall risk among others.  She reports better anxiety control with diazepam as opposed to Xanax intially until now.  She does not want to be prescribed Xanax at any point in the future bc history of dissociation with it.  Discussed long-term side effect risk including dependence, potential withdrawal symptoms, and the potential eventual dose-related risk of dementia.  Evidently developing tolerance.  No higher BZ dose bc of this. Disc use of diazepam for fear of flying upcoming.    Reviewed Genesight results in detail.  Option Viibryd for anxiety  Continue buspirone 15 mg 3 times daily,   DC mirtazapine. Caplyta 42 mg HS trial Caplyta is a versatile medication which should be able to help both depressive anxiety and irritability symptoms.  Discussed the side effect risks in detail.  She agrees to the trial.  Has been compliant with Valium.  OK continue Valium 5 mg  QID prn but take LED.  Continue Vitamin D  FU 8 weeks.  Lynder Parents, MD, DFAPA   Please see After Visit Summary for patient specific instructions.  Future Appointments  Date Time Provider [place]  07/31/2022  9:30 AM Cottle, Billey Co., MD [facility] None  09/14/2022 10:30 AM [facility] CCM PHARMACIST [facility]  11/22/2022 10:00 AM Josue Hector, MD [facility]  05/30/2023 10:15 AM [facility]SE HEALTH ADVISOR [facility]    No orders of the defined types were placed in this encounter.     -------------------------------

## 2022-07-03 NOTE — Telephone Encounter (Signed)
Has apt today at 1:00.

## 2022-07-06 ENCOUNTER — Ambulatory Visit (INDEPENDENT_AMBULATORY_CARE_PROVIDER_SITE_OTHER): Payer: PPO | Admitting: Behavioral Health

## 2022-07-06 DIAGNOSIS — F4001 Agoraphobia with panic disorder: Secondary | ICD-10-CM

## 2022-07-06 DIAGNOSIS — F3162 Bipolar disorder, current episode mixed, moderate: Secondary | ICD-10-CM

## 2022-07-06 DIAGNOSIS — F411 Generalized anxiety disorder: Secondary | ICD-10-CM | POA: Diagnosis not present
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
## 2022-07-13 ENCOUNTER — Ambulatory Visit (INDEPENDENT_AMBULATORY_CARE_PROVIDER_SITE_OTHER): Payer: PPO | Admitting: Behavioral Health

## 2022-07-13 DIAGNOSIS — F411 Generalized anxiety disorder: Secondary | ICD-10-CM | POA: Diagnosis not present

## 2022-07-13 NOTE — Progress Notes (Unsigned)
[facility] Counselor/Therapist Progress Note  Patient ID: Brittany [name], MRN: [id number],    Date: 07/13/2022  Time Spent: 50 minutes   Treatment Type: Individual Therapy  Reported Symptoms: Anxiety   Mental Status Exam:  Appearance:   Casual     Behavior:  Appropriate and Sharing  Motor:  Normal  Speech/Language:   Clear and Coherent  Affect:  Appropriate and Congruent  Mood:  anxious  Thought process:  normal  Thought content:    WNL  Sensory/Perceptual disturbances:    WNL  Orientation:  oriented to person  Attention:  Good  Concentration:  Good  Memory:  WNL  Fund of knowledge:   Good  Insight:    Good  Judgment:   Good  Impulse Control:  Good   Risk Assessment: Danger to Self:  No Self-injurious Behavior: No Danger to Others: No Duty to Warn:no Physical Aggression / Violence:No  Access to Firearms a concern: No  Gang Involvement:No   Subjective:   The patient reports her current medication regimen has changed and she states experiencing side effects of having trouble with sleep and feeling light headed. The patient reports interest with allowing her body time to adjust to the new medication change. She reports belief that she is less anxious and less depressed due to her medication change. She repots having a dream that created anxiety. The patient states she is monitoring her symptoms due to her recent medication change. She denied having any recent panic attacks and reports "Negativity" is a trigger for her panic attacks. The patient reports to cope with panic attacks by "I try to remove myself from the situation, if I can't remove myself from the situation I try to remember my tools". She reports counting things have been helpful. However, she states she has forgotten other coping strategies she learned in the past. The patient reflected on past memory that has allowed her to gain insight on misinformation that she states affected her for many years  regarding the death of her father. She rated her anxiety at an 8 and depression at a 2 on a scale of 1 to 10 with 10 being severe. She reports her depression has gotten better. The patient identified her take away from today's session as "Coping techniques". She reports utilizing coping as repeating Bible verses. The patient reports interest with attending [employer] again once she is able to gain coping strategies to assist her with being around others and states interest with overcoming fear of being around others. Denied SI/HI, AH/VH.   Counselor conducted check in with the patient. Counselor discussed mental health symptoms experienced and requested the patient rate her symptoms. Counselor discussed coping strategies of deep breathing, progressive muscle relaxation technique, imagery and grounding technique to assist the patient with managing anxiety. The counselor provided the patient with psycho educational information to assist the patient with learning how to implement the skills in her daily routine. Counselor assessed for SI/HI, AH/VH.    Interventions: Motivational Interviewing  Diagnosis:   ICD-10-CM   1. Anxiety state  F41.1       Plan:   1) Long Term Goal: Develop strategies to reduce symptoms.     Short Term Goal: Reduce anxiety and improve coping skills.     Objective: Learn two new ways of coping with daily stressors.     Objective: Develop strategies for thought distraction when fixating on the future.     Objective: Manage panic episodes.   2) Long  Term Goal: Maintain stability of mood.     Short Term Goal: Increase ability to manage moods.     Objective: Discuss and resolve troubling personal and interpersonal issues.     Jannifer Hick, [facility]

## 2022-07-15 ENCOUNTER — Encounter: Payer: Self-pay | Admitting: Behavioral Health

## 2022-07-16 ENCOUNTER — Encounter: Payer: Self-pay | Admitting: Behavioral Health

## 2022-07-20 ENCOUNTER — Ambulatory Visit (INDEPENDENT_AMBULATORY_CARE_PROVIDER_SITE_OTHER): Payer: HMO | Admitting: Behavioral Health

## 2022-07-20 DIAGNOSIS — F411 Generalized anxiety disorder: Secondary | ICD-10-CM | POA: Diagnosis not present

## 2022-07-20 NOTE — Progress Notes (Unsigned)
[facility] Counselor/Therapist Progress Note  Patient ID: JOYCLYN PLAZOLA, MRN: [id number],    Date: 07/20/2022  Time Spent: 60 minutes   Treatment Type: Psychotherapy  Reported Symptoms: *** Anxiety   Mental Status Exam:  Appearance:   Casual     Behavior:  Appropriate and Sharing  Motor:  Normal  Speech/Language:   Clear and Coherent  Affect:  Appropriate and Congruent  Mood:  anxious  Thought process:  normal  Thought content:    Rumination  Sensory/Perceptual disturbances:    WNL  Orientation:  oriented to person  Attention:  Good  Concentration:  Good  Memory:  WNL  Fund of knowledge:   Good  Insight:    Good  Judgment:   Good  Impulse Control:  Good   Risk Assessment: Danger to Self:  No Self-injurious Behavior: No Danger to Others: No Duty to Warn:no Physical Aggression / Violence:No  Access to Firearms a concern: No  Gang Involvement:No   Subjective: ***  The patient reports doing "Okay" still having the light headness and dizziness off and on". She reports belief that it maybe side effects she is experiencing from a new medication she has been prescribed by her [facility] prescriber.  She reports attempting to try to accomplish something everyday she states it is not due to depression, it is due to her body temperature of feeling cold  Patient was open to rescheduling her med management appointment for a earlier appointment than being seen in March The patient reports experiencing anxiety this morning states she has attempted to incorporate coping skills of progressive muscle relaxation and square breathing  Patient reported experiencing anxiety due to she will be taking care of her grandson soon for approximately a week to help her daughter she states anxiety due to fear of her routine changing and her pet routine changing  She reported experiencing anxiety due to her friend not respecting her personal boundaries  Patient reports experiencing  anxiety when she has to go to the grocery store due to being "Agoraphobic I feel scared" when she is out in public  The patient reported having memories of three of her pets dying in the moth of February and March  The patient identified her take away from today's session as "ANTS another tool to put in my tool box " Patient denied SI/HI   Counselor conducted check in Counselor utilized reflective listening and validated the patients concerns Counselor discussed thoughts/feelings/behaviors are connected  Counselor educated the patient on automatic negative thoughts Counselor conducted an assignment in session with the patient titled "ANTS" automatic negative thoughts to assist the patient with managing her moods Counselor assessed for SI/HI    Interventions: Cognitive Behavioral Therapy  Diagnosis:   ICD-10-CM   1. Anxiety state  F41.1       Plan: ***  The patient reports experiencing "Fear, anxiety sometimes leads to panic attacks, fear leads to panic attacks and it lead to depression". The patient reports to experience fear of being a burden to her daughter. She reports interest to work on coping strategies to address fear and anxiety.    1) Long Term Goal: Develop strategies to reduce symptoms.     Short Term Goal: Reduce anxiety and improve coping skills.     Objective: Learn two new ways of coping with daily stressors.     Objective: Develop strategies for thought distraction when fixating on the future.     Objective: Manage panic episodes.  2) Long Term Goal: Maintain stability of mood     Short Term Goal: Increase ability to manage moods.     Objective: Discuss and resolve troubling personal and interpersonal issues.   Jannifer Hick, [facility]

## 2022-07-23 ENCOUNTER — Encounter: Payer: Self-pay | Admitting: Behavioral Health

## 2022-07-23 DIAGNOSIS — K638219 Small intestinal bacterial overgrowth, unspecified: Secondary | ICD-10-CM | POA: Diagnosis not present

## 2022-07-26 ENCOUNTER — Ambulatory Visit (INDEPENDENT_AMBULATORY_CARE_PROVIDER_SITE_OTHER): Payer: HMO | Admitting: Behavioral Health

## 2022-07-26 DIAGNOSIS — F411 Generalized anxiety disorder: Secondary | ICD-10-CM

## 2022-07-26 DIAGNOSIS — F3162 Bipolar disorder, current episode mixed, moderate: Secondary | ICD-10-CM | POA: Diagnosis not present

## 2022-07-26 NOTE — Progress Notes (Signed)
[facility] Counselor/Therapist Progress Note  Patient ID: AIME ENER, MRN: [id number],    Date: 08/05/2022  Time Spent: 60 minutes  Treatment Type: Psychotherapy  Reported Symptoms: Depression   Mental Status Exam:  Appearance:   Casual     Behavior:  Appropriate and Sharing  Motor:  Normal  Speech/Language:   Clear and Coherent  Affect:  Appropriate  Mood:  normal  Thought process:  Ruminating   Thought content:    WNL  Sensory/Perceptual disturbances:    WNL  Orientation:  oriented to person  Attention:  Good  Concentration:  Good  Memory:  WNL  Fund of knowledge:   Good  Insight:    Good  Judgment:   Good  Impulse Control:  Good   Risk Assessment: Danger to Self:  No Self-injurious Behavior: No Danger to Others: No Duty to Warn:no Physical Aggression / Violence:No  Access to Firearms a concern: No  Gang Involvement:No   Subjective:   The patient reports "Its been rough past four days" due to a personal circustance regarding her daughter. The patient reports intially experiencing sadness when her daughter shared the difficult time she was experiencing. The patient states the conversation with her daughter triggered past memories she experienced. The patient reports consoling her daughter in her time of need. The patient reflected on memories from her past were her mother was supportive of her. The patient reports utlizing coping skills of prayer, square breathing, and meditating on scripture. In addition, the patient discussed utilzing reframing irrational thughts and implementing positive self talk. Patient reported experiencing nuemerous negative thoughts and received an activity from this counselor to assist her with coping with negative thoughts. She reports negative thoughts, abondment and rejection are things she is working on. The patient identified a personal goal of hers is to grow out her hair and donate it to charity. Patient identified her take away  from today's session as "Relief and hope". Patient denied SI/HI, AH/VH.   Counselor conducted check in. Counselor utilizied reflective listening and validated the patients feelings.  Counselor discussed mental health symptoms experienced. Counselor questioned coping stratgies utilized.  Counselor acknolweedged the patients efforts to reframe her automatic negative thoughts. Counselor provided the patient with an activity to work on at home titled Negative Thoughts Trigger Negative Feelings and explained the acitivity will be process and discussed with the patient in the next session. Counselor assessed for SI/HI, AH/VH.     Interventions: Cognitive Behavioral Therapy  Diagnosis:   ICD-10-CM   1. Bipolar 1 disorder, mixed, moderate ([facility])  F31.62     2. Anxiety state  F41.1       Plan:   The patient reports experiencing "Fear, anxiety sometimes leads to panic attacks, fear leads to panic attacks and it lead to depression". The patient reports to experience fear of being a burden to her daughter. She reports interest to work on coping strategies to address fear and anxiety.    1) Long Term Goal: Develop strategies to reduce symptoms.     Short Term Goal: Reduce anxiety and improve coping skills.     Objective: Learn two new ways of coping with daily stressors.     Objective: Develop strategies for thought distraction when fixating on the future.     Objective: Manage panic episodes.   2) Long Term Goal: Maintain stability of mood     Short Term Goal: Increase ability to manage moods.     Objective: Discuss and resolve  troubling personal and interpersonal issues.     Jannifer Hick, [facility]

## 2022-07-31 ENCOUNTER — Telehealth: Payer: Self-pay | Admitting: Psychiatry

## 2022-07-31 ENCOUNTER — Ambulatory Visit: Payer: PPO | Admitting: Psychiatry

## 2022-07-31 NOTE — Telephone Encounter (Signed)
Next visit is 09/04/22. Brittany [name] has been taking Caplyta samples for about a month and has noticed that she has been getting light headed, freezing cold and dizzy. Are these symptoms of Caplyta? Does she need to stop taking it? Her phone number is [phone].

## 2022-07-31 NOTE — Telephone Encounter (Signed)
RTC to pt and she reports when she first started medication she couldn't sleep, Dr. Clovis Pu advised to take at [facility] due to sleepiness. She discussed with her daughter and thought she would try in the AM. So she did and after the first two mornings she noticed she was lightheaded and having dizziness. She reports noticing that she was cold more but due to the weather didn't think a lot about it at first but now she is cold all the time. She also reports feeling confusion, she really has to think about what she is doing and it takes a bit of time. She has been taking for about a month now, since last office visit. Asking what she should do? She said today she is going to start taking every other day until Dr. Clovis Pu has a recommendation. Informed her I would discuss with Dr. Clovis Pu as soon as I could with him being out of town. She said that's no problem. Advised to call office if symptoms worsen before we call back.

## 2022-07-31 NOTE — Telephone Encounter (Signed)
Please see message about possible SE with Caplyta.

## 2022-08-01 NOTE — Telephone Encounter (Signed)
This need sto wait until I get back in town

## 2022-08-03 ENCOUNTER — Ambulatory Visit (INDEPENDENT_AMBULATORY_CARE_PROVIDER_SITE_OTHER): Payer: HMO | Admitting: Behavioral Health

## 2022-08-03 DIAGNOSIS — F3162 Bipolar disorder, current episode mixed, moderate: Secondary | ICD-10-CM

## 2022-08-03 DIAGNOSIS — F411 Generalized anxiety disorder: Secondary | ICD-10-CM | POA: Diagnosis not present

## 2022-08-03 NOTE — Progress Notes (Signed)
[facility] Counselor/Therapist Progress Note  Patient ID: Brittany [name], MRN: [id number],    Date: 08/05/2022  Time Spent: 60 minutes  Treatment Type: Psychotherapy  Reported Symptoms: Anxiety  Mental Status Exam:  Appearance:   Casual     Behavior:  Appropriate and Sharing  Motor:  Normal  Speech/Language:   Clear and Coherent  Affect:  Appropriate and Congruent  Mood:  anxious  Thought process:  Ruminating   Thought content:    WNL  Sensory/Perceptual disturbances:    WNL  Orientation:  oriented to person  Attention:  Good  Concentration:  Good  Memory:  WNL  Fund of knowledge:   Good  Insight:    Good  Judgment:   Good  Impulse Control:  Good   Risk Assessment: Danger to Self:  No Self-injurious Behavior: No Danger to Others: No Duty to Warn:no Physical Aggression / Violence:No  Access to Firearms a concern: No  Gang Involvement:No   Subjective:   The patient reports she has decided to discontinue her Caplyta prescribed medication due to experiencing uncomfortable side effects. She reports she is tapering off the medication.  She reports having "Gloom and doom thoughts" this morning. She reports attempting to cope by reframing her negative thoughts. She states belief that she is experiencing anxiety due to tapering herself off of the medication. The patient rated her depression at an 8 today and anxiety at a 10 on a scale of 1 to 10 with 10 being severe. The patient states she is attempting to think positive thoughts. The patient states she has discontinued her activity on social media to prevent herself from having negative memories. The patient reports being disappointed in herself due to not engaging in her cleaning routine as usual. She reports belief to have observed a decline in her mental health after her last pets expired.   The patient participated in a activity in session and identified irrational thinking as black and white thinking, exaggerating,  mind reading, and feelings as facts. The patient identified her negative thoughts that lead to black and white thinking and reframed and replaced her negative thoughts with a positive thought. The patient identified her take away from today's session as "I need to believe in myself more and not feel like I have to crawl in a hole and hide".  Counselor conducted check in. Counselor questioned the patients mental health symptoms and requested the patient rate the symptoms. Counselor discussed coping stratgies utilized. Counselor provided assitance to the patient with completing an activity in sessin tilted Negative Thoughts Trigger Negative Feelings. Counselor requested feedback from the patient on what thoughts she identified with and how to reframe the negative thoughts. Counselor and patient planned to continue in the next session with challenging her negative thoughts. Counselor assessed for SI/HI, AH/VH.    Interventions: Cognitive Behavioral Therapy  Diagnosis:   ICD-10-CM   1. Anxiety state  F41.1     2. Bipolar 1 disorder, mixed, moderate (HCC)  F31.62       Plan:   The patient reports experiencing "Fear, anxiety sometimes leads to panic attacks, fear leads to panic attacks and it lead to depression". The patient reports to experience fear of being a burden to her daughter. She reports interest to work on coping strategies to address fear and anxiety.    1) Long Term Goal: Develop strategies to reduce symptoms.     Short Term Goal: Reduce anxiety and improve coping skills.  Objective: Learn two new ways of coping with daily stressors.     Objective: Develop strategies for thought distraction when fixating on the future.     Objective: Manage panic episodes.   2) Long Term Goal: Maintain stability of mood     Short Term Goal: Increase ability to manage moods.     Objective: Discuss and resolve troubling personal and interpersonal issues.      Clydia Llano,  [facility]

## 2022-08-05 ENCOUNTER — Encounter: Payer: Self-pay | Admitting: Behavioral Health

## 2022-08-09 ENCOUNTER — Ambulatory Visit (INDEPENDENT_AMBULATORY_CARE_PROVIDER_SITE_OTHER): Payer: HMO | Admitting: Behavioral Health

## 2022-08-09 ENCOUNTER — Encounter: Payer: Self-pay | Admitting: Behavioral Health

## 2022-08-09 DIAGNOSIS — F411 Generalized anxiety disorder: Secondary | ICD-10-CM | POA: Diagnosis not present

## 2022-08-09 NOTE — Progress Notes (Signed)
[facility] Counselor/Therapist Progress Note  Patient ID: LENIYAH BETO, MRN: [id number],    Date: 08/09/2022  Time Spent:  60 minutes  Treatment Type: Individual Therapy  Reported Symptoms: Anxiety  Mental Status Exam:  Appearance:   Casual     Behavior:  Appropriate and Sharing  Motor:  Normal  Speech/Language:   Clear and Coherent  Affect:  Appropriate and Congruent  Mood:  normal  Thought process:  normal  Thought content:    WNL  Sensory/Perceptual disturbances:    WNL  Orientation:  oriented to person  Attention:  Good  Concentration:  Good  Memory:  WNL  Fund of knowledge:   Good  Insight:    Good  Judgment:   Good  Impulse Control:  Good   Risk Assessment: Danger to Self:  No Self-injurious Behavior: No Danger to Others: No Duty to Warn:no Physical Aggression / Violence:No  Access to Firearms a concern: No  Gang Involvement:No   Subjective:   The patient reports she has discontinued her Caplyta due to side effects she experienced. She reports coping by utilizing her spirituality and reframing her negative thougts with positive self talk. She reports encouraging herself by saying "This is gonna pass". She states she has began walking on her tredmill and expressed gratitude with deep cleaning her home. She reports having something to look forward to with keeping her grandson soon. The patient states belief that "My mindset has changed, talk therapy has helped me tremedously coming to talk to you has helped". The patient reports experiencing hope.    She identified a negative thinking error as "Forecasting" she states she has began reframing this negative thought to help her manage her anxiety. The patient reported establishing a healthy boundary with her friend to assist her with maintaing her mood stability. The patient discussed concerns of her grandson soon coming to stay with her. She identified a plan she has created to assist her with her two year old  grandsons arrival. She reported plans to give herself grace if she has not remembered everything to prepare for his stay. She reported feeling more confident in session after hearing herself discuss her strategy she has designed. She states she is looking forward to her plans for her grandson to stay with her. She identified her take away from today's session as "Being able to share the happiness and hope". The patient rated her anxiety at a 3 and her depressioon at a 1. The patient denied SI/HI, AH/VH.   The counselor conducted check in. The counselor discussed the patients mental health symptoms experienced and requested she rate her symptoms. The counselor utilized reflective listening and validated the patients concerns.The counselor the counselor discussed negative thinking errors and utilizing positive self talk to help her reframe negative automatic thoughts. The counselor assessed for SI/HI, AH/VH.  Interventions: Cognitive Behavioral Therapy  Diagnosis:   ICD-10-CM   1. Anxiety state  F41.1       Plan:   The patient reports experiencing "Fear, anxiety sometimes leads to panic attacks, fear leads to panic attacks and it lead to depression". The patient reports to experience fear of being a burden to her daughter. She reports interest to work on coping strategies to address fear and anxiety.    1) Long Term Goal: Develop strategies to reduce symptoms.     Short Term Goal: Reduce anxiety and improve coping skills.     Objective: Learn two new ways of coping with daily stressors.  Objective: Develop strategies for thought distraction when fixating on the future.     Objective: Manage panic episodes.   2) Long Term Goal: Maintain stability of mood     Short Term Goal: Increase ability to manage moods.     Objective: Discuss and resolve troubling personal and interpersonal issues.       Jannifer Hick, [facility]

## 2022-08-09 NOTE — Telephone Encounter (Signed)
There's 10% chance of dizziness or sleepiness. It should not interfere with sleep.  She should take it at night.    I did not see any information about it making people "cold".  It was prescribed to help depression, anxiety and irritability.  Are those sx better?  If they are not better she can stop it if she wants.  If sx are better, she could try the lower dose but I don't have samples of it.  I would need to send in a RX.  I doubt taking it every other day will work to either improve SE or control mood sx.

## 2022-08-13 NOTE — Telephone Encounter (Signed)
LVM to [facility] 

## 2022-08-13 NOTE — Telephone Encounter (Signed)
Patient stopped the Caplyta, said she could not tolerate SE. She said her issues since starting therapy with [facility] are better and she just wants to continue therapy at this point and not try another medication.

## 2022-08-14 ENCOUNTER — Encounter: Payer: Self-pay | Admitting: Behavioral Health

## 2022-08-14 ENCOUNTER — Ambulatory Visit (INDEPENDENT_AMBULATORY_CARE_PROVIDER_SITE_OTHER): Payer: HMO | Admitting: Behavioral Health

## 2022-08-14 DIAGNOSIS — F4312 Post-traumatic stress disorder, chronic: Secondary | ICD-10-CM | POA: Diagnosis not present

## 2022-08-14 DIAGNOSIS — F515 Nightmare disorder: Secondary | ICD-10-CM | POA: Diagnosis not present

## 2022-08-14 DIAGNOSIS — F411 Generalized anxiety disorder: Secondary | ICD-10-CM | POA: Diagnosis not present

## 2022-08-14 NOTE — Progress Notes (Signed)
[facility] Counselor/Therapist Progress Note  Patient ID: Brittany [name], MRN: [id number],    Date: 08/14/2022  Time Spent: 60 minutes   Treatment Type: Psychotherapy  Reported Symptoms: Anxiety   Mental Status Exam:  Appearance:   Casual     Behavior:  Appropriate and Sharing  Motor:  Normal  Speech/Language:   Clear and Coherent  Affect:  Appropriate and Congruent  Mood:  normal  Thought process:  normal  Thought content:    WNL  Sensory/Perceptual disturbances:    WNL  Orientation:  oriented to person  Attention:  Good  Concentration:  Good  Memory:  WNL  Fund of knowledge:   Good  Insight:    Good  Judgment:   Good  Impulse Control:  Good   Risk Assessment: Danger to Self:  No Self-injurious Behavior: No Danger to Others: No Duty to Warn:no Physical Aggression / Violence:No  Access to Firearms a concern: No  Gang Involvement:No   Subjective:   The patient states plans to register her pet as an emotional support animal. She states concerns when she has to kennel her pet due to his separation anxiety. She reports to have vivid dreams once or twice a month she states to cope by deep breathing exercises and positive self talk. She reports to adhere to her Doxazosin prescribed medication and states she has discontinued her Caplyta. The patient identified distorted thinking errors as forecasting, self blaming, feelings as facts, and discounting the positives. The patient reports coping via reframing her automatic negative thoughts and implementing positive self talk. The patient expressed gratitude with looking forward to seeing her grandson tomorrow. She identified her take away from today's session as "Its still hope and joy. I feel a little bit more joyful than I did when I walked in here". The patient denied experiencing SI/HI, AH/VH.  Counselor conducted check in. Counselor discussed mental health symptoms experienced. Counselor discussed medication compliance.  Counselor and patient reviewd automatic negative thoughts and thinking errors. Counselor discussed anticipatory anxiety. Counselor educated the patient on grounding techniques and provided her with Psychoeducational information due to her report of experiencing trauma related symptoms.    Counselor assesed for SI/HI, AH/VH.  Interventions: Cognitive Behavioral Therapy  Diagnosis:   ICD-10-CM   1. Anxiety state  F41.1     2. Nightmares associated with chronic post-traumatic stress disorder  F51.5    F43.12       Plan:   The patient reports experiencing "Fear, anxiety sometimes leads to panic attacks, fear leads to panic attacks and it lead to depression". The patient reports to experience fear of being a burden to her daughter. She reports interest to work on coping strategies to address fear and anxiety.    1) Long Term Goal: Develop strategies to reduce symptoms.     Short Term Goal: Reduce anxiety and improve coping skills.     Objective: Learn two new ways of coping with daily stressors.     Objective: Develop strategies for thought distraction when fixating on the future.     Objective: Manage panic episodes.   2) Long Term Goal: Maintain stability of mood.     Short Term Goal: Increase ability to manage moods.     Objective: Discuss and resolve troubling personal and interpersonal issues.     Jannifer Hick, [facility]

## 2022-08-16 ENCOUNTER — Other Ambulatory Visit: Payer: Self-pay | Admitting: Psychiatry

## 2022-08-16 DIAGNOSIS — F4310 Post-traumatic stress disorder, unspecified: Secondary | ICD-10-CM

## 2022-08-16 DIAGNOSIS — F4001 Agoraphobia with panic disorder: Secondary | ICD-10-CM

## 2022-08-17 ENCOUNTER — Other Ambulatory Visit: Payer: Self-pay

## 2022-08-17 ENCOUNTER — Telehealth: Payer: Self-pay

## 2022-08-17 MED ORDER — MIRTAZAPINE 45 MG PO TABS: 45.0000 mg | ORAL_TABLET | Freq: Every day | ORAL | 0 refills | Status: DC

## 2022-08-17 NOTE — Telephone Encounter (Signed)
Rx sent 

## 2022-08-17 NOTE — Telephone Encounter (Signed)
Mirtazapine was discontinued by Dr. Clovis Pu at 1/16 visit.

## 2022-08-17 NOTE — Telephone Encounter (Signed)
I refused patient's RF for mirtazapine because in her 1/16 note it says to discontinue. She said that was because she was started on Caplyta but she is no longer taking that and wants a RF of her mirtazapine.  I can send in a RF, just want to check that it is ok.

## 2022-08-17 NOTE — Telephone Encounter (Signed)
Pt called at 10:50a.  She was asking why her Mirtazapine script was denied.  Pls send in.  Next appt 3/19

## 2022-08-17 NOTE — Telephone Encounter (Signed)
ok 

## 2022-08-21 ENCOUNTER — Ambulatory Visit (INDEPENDENT_AMBULATORY_CARE_PROVIDER_SITE_OTHER): Payer: HMO | Admitting: Behavioral Health

## 2022-08-21 DIAGNOSIS — F411 Generalized anxiety disorder: Secondary | ICD-10-CM | POA: Diagnosis not present

## 2022-08-21 NOTE — Progress Notes (Unsigned)
[facility] Counselor/Therapist Progress Note  Patient ID: Brittany [name], MRN: [id number],    Date: 08/22/2022  Time Spent: 60 minutes   Treatment Type: Psychotherapy  Reported Symptoms:  Anxiety   Mental Status Exam:  Appearance:   Casual     Behavior:  Appropriate and Sharing  Motor:  Normal  Speech/Language:   Clear and Coherent  Affect:  Appropriate and Congruent  Mood:  anxious  Thought process:  normal  Thought content:    WNL  Sensory/Perceptual disturbances:    WNL  Orientation:  oriented to person  Attention:  Good  Concentration:  Good  Memory:  WNL  Fund of knowledge:   Good  Insight:    Good  Judgment:   Good  Impulse Control:  Good   Risk Assessment: Danger to Self:  No Self-injurious Behavior: No Danger to Others: No Duty to Warn:no Physical Aggression / Violence:No  Access to Firearms a concern: No  Gang Involvement:No   Subjective:   The patient reports since last being seen she is doing "Fairly good, I've had a few moments when I had to get my paperwork out and say rearrange your thoughts". She reports feeling anxious about going to her daughter's house with her pet and being around her daughters pet for over week. The patient identified a plan for her to utilize when she goes to stay at her daughter's home. The patient reports attempting to set goals of completing daily task. She reports attempting to de-clutter her home to make her feel at ease. The patient reported feeling guilty due to setting a boundary with her friend. The patient identified various scenarios where she has helped her friend in the past. The patient expressed reasons she identified as valid regarding her reason for setting boundaries. The patient identified her take away from today's session as "Hope". The patient requested a female [place] therapist due to being informed this therapist time is limited with [facility]. The patient denied SI/HI, AH/VH.    Counselor conducted check in. Counselor utilized reflective listening and validated the patients concerns. Counselor discussed establishing healthy boundaries. Counselor provided the patient with psycho educational information to assist her with managing her mental health. Counselor informed the patient her time is limited with [facility] and discussed the patients interest with being referred to another therapist.  Counselor assessed for SI/HI, AH/VH.  Interventions: Solution-Oriented/Positive Psychology  Diagnosis:   ICD-10-CM   1. Anxiety state  F41.1       Plan:   The patient reports experiencing "Fear, anxiety sometimes leads to panic attacks, fear leads to panic attacks and it lead to depression". The patient reports to experience fear of being a burden to her daughter. She reports interest to work on coping strategies to address fear and anxiety.    1) Long Term Goal: Develop strategies to reduce symptoms.     Short Term Goal: Reduce anxiety and improve coping skills.     Objective: Learn two new ways of coping with daily stressors.     Objective: Develop strategies for thought distraction when fixating on the future.     Objective: Manage panic episodes.   2) Long Term Goal: Maintain stability of mood.     Short Term Goal: Increase ability to manage moods.     Objective: Discuss and resolve troubling personal and interpersonal issues.       Jannifer Hick, [facility]

## 2022-08-22 ENCOUNTER — Encounter: Payer: Self-pay | Admitting: Behavioral Health

## 2022-09-03 ENCOUNTER — Ambulatory Visit: Payer: PPO | Admitting: Psychiatry

## 2022-09-04 ENCOUNTER — Ambulatory Visit (INDEPENDENT_AMBULATORY_CARE_PROVIDER_SITE_OTHER): Payer: HMO | Admitting: Psychiatry

## 2022-09-04 ENCOUNTER — Encounter: Payer: Self-pay | Admitting: Psychiatry

## 2022-09-04 DIAGNOSIS — F5105 Insomnia due to other mental disorder: Secondary | ICD-10-CM | POA: Diagnosis not present

## 2022-09-04 DIAGNOSIS — F4001 Agoraphobia with panic disorder: Secondary | ICD-10-CM | POA: Diagnosis not present

## 2022-09-04 DIAGNOSIS — F515 Nightmare disorder: Secondary | ICD-10-CM | POA: Diagnosis not present

## 2022-09-04 DIAGNOSIS — F4310 Post-traumatic stress disorder, unspecified: Secondary | ICD-10-CM

## 2022-09-04 DIAGNOSIS — F4312 Post-traumatic stress disorder, chronic: Secondary | ICD-10-CM | POA: Diagnosis not present

## 2022-09-04 MED ORDER — MIRTAZAPINE 45 MG PO TABS: 45.0000 mg | ORAL_TABLET | Freq: Every day | ORAL | 1 refills | Status: DC

## 2022-09-04 MED ORDER — DOXAZOSIN MESYLATE 4 MG PO TABS: 8.0000 mg | ORAL_TABLET | Freq: Every evening | ORAL | 1 refills | Status: DC

## 2022-09-04 MED ORDER — BUSPIRONE HCL 15 MG PO TABS: 15.0000 mg | ORAL_TABLET | Freq: Three times a day (TID) | ORAL | 1 refills | Status: DC

## 2022-09-04 MED ORDER — DIAZEPAM 5 MG PO TABS: 5.0000 mg | ORAL_TABLET | Freq: Four times a day (QID) | ORAL | 5 refills | Status: DC | PRN

## 2022-09-04 NOTE — Progress Notes (Signed)
Brittany [name] [id number] 01/01/1956 67 y.o.     Subjective:   Patient ID:  Brittany [name] is a 67 y.o. (DOB September 20, 1955) female.  Chief Complaint:  Chief Complaint  Patient presents with   Follow-up   Depression   Anxiety   Sleeping Problem    Anxiety Symptoms include nervous/anxious behavior and shortness of breath. Patient reports no confusion, decreased concentration, dizziness or suicidal ideas.    Depression        Associated symptoms include fatigue.  Associated symptoms include no decreased concentration and no suicidal ideas.  Past medical history includes anxiety.    Brittany [name] presents to the office today for follow-up of long-term depression and anxiety and insomnia.    At visit September 08, 2018 at which time the patient was weaned off of gabapentin and started on Trileptal up to twice daily.  She has been very med sensitive and difficult to treat because of that.  When seen September 19, 2018.  No meds were changed.  She stopped oxcarbazapine on her own DT itching.  seen July 2020.  She did not want to try any additional medications despite ongoing depression.  She wanted to stop lithium despite risk of worsening depression.  She was given instructions about how to do it in the safest manner possible.  Last seen August 12, 2019..   Still taking diazepam 5 TID, mirtazapine 15, doxazosin increased to 8 mg nightly for nightmares from 6 mg.    10/16/2019 appointment, the following noted: NM much better with increase prazosin to 8 mg HS and anxiety better that was related and tolerated.  Overall anxiety still a problem.  depression and anxiety much worse for a week after 2nd Covid vaccine recently.   Also overall more anxious and agoraphobia is bad.  Does force herself to go to [employer] weekly.   Averaging diazepam 5 mg BID-TID daily.  No drowsiness.  Avoids more at night bc of worsening NM from it. Weird dreams and occ awakens with panic.  Lost another "fur baby" that's 2  in 1 year early 2021.  $ and health stress.  Dreams are worse than last visit.  No SE.  Mirtazapine helps sleep generally and much better with it than without it.  Post Covid November 2020. Got significantly better in depression since here.  Prayed a lot about it and thought the lithium was helping.  Feels better now off it.  Was going through a lot at the time.  But after a month more peace and joy.   Buspirone helped.  Diazepam better than Xanax which she feels triggered dissociation and irritabilty.  Lost her dog which is hard.  Prayed and that helped.  Oldest GS joined special forces and gave her his car.  Anxiety driving but does it.   Plan: no med changes  02/09/20 appt with the following noted: Went back down on doxazosin to 4 mg bc of dizziness but it's not better.  She plans to talk to her other doctors about it.. CO anxiety and wanting to take more meds for it.  Anxiety without reason generally. Increased Diazepam from 5 mg TID to QID end May 2021.   Worsening mirtazapine also.  Has done some night eating without memory the next day. Doesn't seem to be anxiety causing insomnia.  Plan: Start topiramate off label for anxiety and weight loss at 25 mg daily and increase gradually to 100 mg daily.  06/21/2020 appointment with the following noted: Had problems  with night terrors again with stress around $ and pet sick.  Getting better again.   Still taking topiramate 100 mg started last visit. Lost weight DT stomach problems.  Doctors cannot explain.  Feels like poor absorption of nutrients.  Difficulty tolerating foods.  Dx bacterial overgrowth in small intestine but corrected. Stomach constantly burns.  Sx were present before topiramate? Anxiety is OK if stays home but is worse in public.  Meds are helping anxiety.  Not markedly depressed.  Meds are helping.  11/30/20 appt noted: Chronic anxiety averaging ? Amount daily of diazepam.  Anxiety is higher out of the house.  Gets out of the house  regularly to help care for 7 mos old GS.  D took her to beach last week.   Has 5 gallon fish tank with some guppies.   Occ night terrrors. Occ EMA Taking doxazosin 6 mg HS, 4 mg was less effective. Generally more trouble with anxiety than depres but some depression occ with $ stress and despise where she lives.   Ketogenic diet helped GI usually but some pain today.  Haunted with medical bills. Had Covid hosp 11 days and then home with O2 for a month.  Pulm said don't get Covid again.  Keeps her from going to church regularly but that would help. Plan: Also topiramate Continue buspirone 15 mg 3 times daily,  and mirtazapine 30 mg nightly for sleep.  Has been compliant with Valium.  She asks about an increase to increase.  OK continue Valium 5 mg  QID prn but take LED. Cont buspirone 15 mg TID, doxazosin 6-8 mg HS  03/29/2021 appointment with the following noted: Has aquarium. Doing OK except some seasonal depression and irritability noticed. Increased doxazosin between 6-8 mg HS.  Still having weird dreams but not having NM or night terrrors usually. No med changes  09/27/2021 appointment with the following noted: A lot better than normal Oct=Feb.  New GS, D married and some things to look forward to but still some anniversaries of losses.  Helping D with 36 mo old GS helps her a lot.  But did gain weight over the winter.  Dx CAD and put on Statin with cog and depressive and anxiety SE.  Current one not as bad but a little more anxiety and using a little more diazepam.  Nervous about people working in her appt. Going to [facility] on plane for first time with her family.  Excited but nervous too.   Plan no med changes  01/25/2022 appointment noted: No concerns about psych meds except Chronic $ stress.  Has to make choices about essentials.  I put myself into dept living on credit cards. Will go to sleep but EMA 40% of time despite mirtazapine.  Not usually a napper but on occ.    Can't  tolerate statins. Even with CoQ10. Chronic med sensitivity.  Brought Genesight test. Got SI about 2-4 weeks into statin and better off it after 3-4 weeks off it. Minimal depression at this time.  Some chronic anxiety which is worse than depression. Disc problematic night eating.  07/02/2022 phone call complaining of high depression and anxiety.  Appointment was moved up.  07/03/22 appointment noted: Current psych meds diazepam 5 mg every 6 hours as needed, mirtazapine 45 mg nightly, buspirone 15 mg 3 times daily, doxazosin 4 to 8 mg nightly prn. Thinks mood got worse when on statins.  Stopped it in summer.  Then took red yeast rice and had a little  with it too.   Gained wt in summer from foot surgery.  $ stress from living on credit cards. Complaining of much worse depression and anxiety.  Increased SI persistent like 10-15 years ago. Don't like crowds.  Frustrated and irritable. Thoutghts jumbled. Was at diazepam 5 mg 3 daily and now 4 daily.  At times has taken 10 mg at a time. Plans to restart therapy.  Wants Christian therapist. Chronically fearful of psych meds and doesn't want to start something extra. Still a lot of GI problems. Plan: DC mirtazapine. Caplyta 42 mg HS trial  07/31/22 TC stopped Caplyta and doesn't want to try another med.  Complained of dizziness and being cold with it.  Didn't make her sleepy.    09/04/22 appt noted: Resumed mirtazapine 45 HS, diazepam 5 , buspirone 15 TID, doxazosine 4-8 mg HS. NM occ but less severe.   Still has some EMA.  She doesn't want to change doses. Seeing therapist has helped her cope and mood and anxiety is better.  She doesn't feel she needs more meds or other meds.  No severe mood swings.  Not severely depressed.  Anxiety manageable usually.   Was more irritable when visited D and 2yo grandson.  Did miss meds more while there. No SE with current meds. Insurance will cover 100 days of meds. I think I have a control problem.    Worries  about weight gain with meds generally fearful of meds.  Chronic $ problems.  Stopped therapy about September 2020.  Multiple med failures. , restoril, diazepam is ok. NO MORE Xanax DT dissociation and irritability,   Trazodone NR, Ambien, hyrdroxyzine, prazosin, doxazosin, propranolol, mirtazapine 45 sertraline, fluoxetine NR, Lexapro,   Buspar 15 TID, gabapentin,  Latuda, Olanzapine, risperidone with AE,   Seroquel SE, Geodon, Saphris,  Abilify, Rexulti seemed to help for short while but $, Caplyta 42 dizzy  Trileptal itching, lithium NR, CBZ SE, Lamotrigine, VPA,   Review of Systems:  Review of Systems  Constitutional:  Positive for fatigue. Negative for unexpected weight change.  HENT:  Negative for congestion.   Respiratory:  Positive for shortness of breath and wheezing.   Gastrointestinal:  Positive for abdominal distention.  Musculoskeletal:  Positive for back pain.  Neurological:  Negative for dizziness, tremors and light-headedness.  Psychiatric/Behavioral:  Positive for dysphoric mood and sleep disturbance. Negative for agitation, behavioral problems, confusion, decreased concentration, hallucinations, self-injury and suicidal ideas. The patient is nervous/anxious. The patient is not hyperactive.        Please refer to HPI  Stress eating.  Medications: I have reviewed the patient's current medications.  Current Outpatient Medications  Medication Sig Dispense Refill   albuterol (PROVENTIL) (2.5 MG/3ML) 0.083% nebulizer solution Take 3 mLs (2.5 mg total) by nebulization every 6 (six) hours as needed for wheezing or shortness of breath. 75 mL 12   Albuterol Sulfate (PROAIR RESPICLICK) 123XX123 (90 Base) MCG/ACT AEPB Inhale 2 puffs into the lungs every 6 (six) hours as needed. 1 each 6   Azelastine HCl 137 MCG/SPRAY SOLN SPRAY 1 SPRAY IN EACH NOSTRIL TWICE DAILY AS DIRECTED 30 mL 1   b complex vitamins capsule Take 1 capsule by mouth daily.     cetirizine (ZYRTEC) 10 MG tablet  Take 1 tablet (10 mg total) by mouth daily. 90 tablet 1   Cholecalciferol (VITAMIN D-3 PO) Take 10,000 Units by mouth daily.      COLLAGEN PO Take by mouth.     dicyclomine (BENTYL) 20 MG tablet  TAKE ONE TABLET BY MOUTH FOUR TIMES A DAY AS NEEDED FOR SPASMS 360 tablet 3   docusate sodium (COLACE) 100 MG capsule Take 1 capsule (100 mg total) by mouth 2 (two) times daily. While taking narcotic pain medicine. 30 capsule 0   famotidine (PEPCID) 20 MG tablet Take 1 tablet (20 mg total) by mouth 2 (two) times daily. (Patient taking differently: Take 20 mg by mouth as needed.) 180 tablet 3   ferrous sulfate 325 (65 FE) MG EC tablet Take 325 mg by mouth 2 (two) times a week.     fluticasone (FLONASE) 50 MCG/ACT nasal spray Place 2 sprays into both nostrils daily. 96 mL 2   fluticasone-salmeterol (ADVAIR HFA) 230-21 MCG/ACT inhaler Inhale 2 puffs into the lungs 2 (two) times daily. 1 each 12   fluticasone-salmeterol (ADVAIR) 250-50 MCG/ACT AEPB Inhale 1 puff into the lungs every 12 (twelve) hours. 60 each 3   MAGNESIUM PO Calcium citrate, magnesium and vitamin D3 combo     montelukast (SINGULAIR) 10 MG tablet TAKE ONE TABLET BY MOUTH EVERY NIGHT AT BEDTIME 90 tablet 2   Multiple Vitamin (MULTIVITAMIN WITH MINERALS) TABS tablet Take 1 tablet by mouth daily.     pantoprazole (PROTONIX) 40 MG tablet TAKE ONE TABLET BY MOUTH TWICE A DAY 180 tablet 3   pseudoephedrine-guaifenesin (MUCINEX D) 60-600 MG 12 hr tablet Take 1 tablet by mouth every 12 (twelve) hours. 15 tablet 0   Red Yeast Rice 600 MG CAPS Take 1 capsule by mouth daily.     sodium chloride (OCEAN) 0.65 % SOLN nasal spray Place 1 spray into both nostrils as needed for congestion.     busPIRone (BUSPAR) 15 MG tablet Take 1 tablet (15 mg total) by mouth 3 (three) times daily. 300 tablet 1   diazepam (VALIUM) 5 MG tablet Take 1 tablet (5 mg total) by mouth every 6 (six) hours as needed. for anxiety 120 tablet 5   doxazosin (CARDURA) 4 MG tablet Take 2  tablets (8 mg total) by mouth at bedtime. 200 tablet 1   mirtazapine (REMERON) 45 MG tablet Take 1 tablet (45 mg total) by mouth at bedtime. 100 tablet 1   Current Facility-Administered Medications  Medication Dose Route Frequency Provider Last Rate Last Admin   0.9 %  sodium chloride infusion  500 mL Intravenous Once [place], MD       0.9 %  sodium chloride infusion  500 mL Intravenous Once [place], MD        Medication Side Effects: None  Allergies:  Allergies  Allergen Reactions   Aspirin Other (See Comments)    discomfort   Atorvastatin     Patient states she felt suicidal   Codeine Itching   Crestor [Rosuvastatin]     Dizzy, leg pain and states she felt suicidal   Gabapentin    Niacin And Related Itching   Sulfa Antibiotics     Other reaction(s): Unknown   Sulfamethoxazole-Trimethoprim Other (See Comments)    Unknown reaction per pt   Topiramate Other (See Comments)    Per patient, it causes stomach burning   Trileptal [Oxcarbazepine]     itching   Trimethoprim Other (See Comments)    Unknown, per pt   Ultram [Tramadol] Itching   Vioxx [Rofecoxib]     Unknown per pt    Past Medical History:  Diagnosis Date   Abnormal EKG    Allergy    Anxiety    Asthma    Bipolar depression (  [place]), Anxiety, PTSD, Panic disorder    -managed by [facility]   Depression    Foot pain    GERD (gastroesophageal reflux disease)    Heart murmur    as a child   Insomnia    Leg swelling    Osteoporosis    osteopenia   Pneumonia due to COVID-19 virus 04/22/2019   Tachycardia     Family History  Problem Relation Age of Onset   Heart failure Mother    Hypertension Mother    Hyperlipidemia Mother    Congestive Heart Failure Mother    Other Father        killed   COPD Paternal 11        a lot of aunts and uncle COPD or Emphysema   Emphysema Paternal Uncle    Colon polyps Maternal Aunt    COPD Brother    Heart disease Brother    Pulmonary  embolism Brother    Breast cancer Neg Hx    Colon cancer Neg Hx    Esophageal cancer Neg Hx    Pancreatic cancer Neg Hx    Stomach cancer Neg Hx    Liver disease Neg Hx    Rectal cancer Neg Hx     Social History   Socioeconomic History   Marital status: Single    Spouse name: Not on file   Number of children: 3   Years of education: Not on file   Highest education level: Not on file  Occupational History   Occupation: disabled  Tobacco Use   Smoking status: Former    Packs/day: 20.00    Years: 1.00    Additional pack years: 0.00    Total pack years: 20.00    Types: Cigarettes    Quit date: 06/19/1999    Years since quitting: 23.2   Smokeless tobacco: Never   Tobacco comments:    20 year estimate but probably less   Vaping Use   Vaping Use: Never used  Substance and Sexual Activity   Alcohol use: Yes    Alcohol/week: 0.0 standard drinks of alcohol    Comment: occ   Drug use: No   Sexual activity: Never  Other Topics Concern   Not on file  Social History Narrative   Work or School: Disabled seconndary to psychiatric conditions      Home Situation: lives alone with 2 cats and one dog      Spiritual Beliefs: Christian      Lifestyle: no regular exercise, diet  Not great - wants to embark on healthier lifestyle   Social Determinants of Health   Financial Resource Strain: Low Risk  (05/24/2022)   Overall Financial Resource Strain (CARDIA)    Difficulty of Paying Living Expenses: Not hard at all  Food Insecurity: No Food Insecurity (05/24/2022)   Hunger Vital Sign    Worried About Running Out of Food in the Last Year: Never true    [place] in the Last Year: Never true  Transportation Needs: No Transportation Needs (05/24/2022)   PRAPARE - Hydrologist (Medical): No    Lack of Transportation (Non-Medical): No  Physical Activity: Insufficiently Active (05/24/2022)   Exercise Vital Sign    Days of Exercise per Week: 5 days     Minutes of Exercise per Session: 20 min  Stress: Stress Concern Present (05/24/2022)   [place]    Feeling of Stress :  To some extent  Social Connections: Moderately Integrated (05/24/2022)   Social Connection and Isolation Panel [NHANES]    Frequency of Communication with Friends and Family: More than three times a week    Frequency of Social Gatherings with Friends and Family: More than three times a week    Attends Religious Services: More than 4 times per year    Active Member of [employer] or Organizations: Yes    Attends Archivist Meetings: More than 4 times per year    Marital Status: Divorced  Intimate Partner Violence: Not At Risk (05/24/2022)   Humiliation, Afraid, Rape, and Kick questionnaire    Fear of Current or Ex-Partner: No    Emotionally Abused: No    Physically Abused: No    Sexually Abused: No    Past Medical History, Surgical history, Social history, and Family history were reviewed and updated as appropriate.   Please see review of systems for further details on the patient's review from today.   Objective:   Physical Exam:  There were no vitals taken for this visit.  Physical Exam Constitutional:      General: She is not in acute distress. Musculoskeletal:        General: No deformity.  Neurological:     Mental Status: She is alert and oriented to person, place, and time.     Cranial Nerves: No dysarthria.     Coordination: Coordination normal.  Psychiatric:        Attention and Perception: Attention and perception normal. She does not perceive auditory or visual hallucinations.        Mood and Affect: Mood is anxious and depressed. Affect is not labile, blunt, angry or inappropriate.        Speech: Speech normal. Speech is not rapid and pressured or slurred.        Behavior: Behavior normal. Behavior is cooperative.        Thought Content: Thought content normal. Thought content is  not paranoid or delusional. Thought content does not include homicidal or suicidal ideation. Thought content does not include suicidal plan.        Cognition and Memory: Cognition and memory normal.        Judgment: Judgment normal.     Comments: Insight and judgment fair.  not overtly manic.  chronic $ stress Anxiety and depression better with therapy. talkative     Lab Review:     Component Value Date/Time   NA 141 11/17/2021 0835   K 4.4 11/17/2021 0835   CL 102 11/17/2021 0835   CO2 25 11/17/2021 0835   GLUCOSE 94 11/17/2021 0835   GLUCOSE 97 05/10/2021 0934   BUN 16 11/17/2021 0835   CREATININE 0.75 11/17/2021 0835   CREATININE 0.94 04/06/2020 0916   CALCIUM 9.7 11/17/2021 0835   PROT 6.6 11/17/2021 0835   ALBUMIN 4.8 11/17/2021 0835   AST 16 11/17/2021 0835   ALT 23 11/17/2021 0835   ALKPHOS 68 11/17/2021 0835   BILITOT 0.4 11/17/2021 0835   GFRNONAA >60 05/01/2019 0155   GFRAA >60 05/01/2019 0155       Component Value Date/Time   WBC 4.4 05/10/2021 0934   RBC 5.04 05/10/2021 0934   HGB 15.5 (H) 05/10/2021 0934   HCT 45.9 05/10/2021 0934   PLT 318.0 05/10/2021 0934   MCV 91.1 05/10/2021 0934   MCH 31.7 04/06/2020 0916   MCHC 33.8 05/10/2021 0934   RDW 14.6 05/10/2021 0934   LYMPHSABS 0.8 05/10/2021 0934  MONOABS 0.3 05/10/2021 0934   EOSABS 0.1 05/10/2021 0934   BASOSABS 0.0 05/10/2021 0934    No results found for: "POCLITH", "LITHIUM"   Lab Results  Component Value Date   VALPROATE 25.3 (L) 10/26/2008     .res Assessment: Plan:    Brittany [name] was seen today for follow-up, depression, anxiety and sleeping problem.  Diagnoses and all orders for this visit:  PTSD (post-traumatic stress disorder) -     busPIRone (BUSPAR) 15 MG tablet; Take 1 tablet (15 mg total) by mouth 3 (three) times daily. -     mirtazapine (REMERON) 45 MG tablet; Take 1 tablet (45 mg total) by mouth at bedtime.  Panic disorder with agoraphobia -     mirtazapine (REMERON) 45 MG  tablet; Take 1 tablet (45 mg total) by mouth at bedtime. -     diazepam (VALIUM) 5 MG tablet; Take 1 tablet (5 mg total) by mouth every 6 (six) hours as needed. for anxiety  Nightmares associated with chronic post-traumatic stress disorder -     doxazosin (CARDURA) 4 MG tablet; Take 2 tablets (8 mg total) by mouth at bedtime. -     mirtazapine (REMERON) 45 MG tablet; Take 1 tablet (45 mg total) by mouth at bedtime.  Insomnia due to mental condition -     mirtazapine (REMERON) 45 MG tablet; Take 1 tablet (45 mg total) by mouth at bedtime.  Chronic noncompliance complicates treatment but bettter lately Likey borderline personality disorder.  Greater than 50% of 30 min face to face time with patient was spent on counseling and coordination of care. We discussed her chronic sx of depression, anxiety and insomnia combined with medication sensitivity which makes it very difficult to treat her.  Multiple med failures.  Limited insight into bipolar vs PD and has refused mood stabilizers.  This was discussed with her repeatedly. But she has not had manic episodes off mood stabilizers..  Her faith helps.  Seems unusual that she's not having manic episodes apparently without mood stabilizer.    At the visit July 03, 2022 she is complaining of more symptoms of depression and anxiety and irritability.  She is having additional financial stress but financial stress has been chronic.  She feels like her mood worsened while on statins and has never recovered since the summer 2023 but it is progressively worse. She has failed to respond to multiple SSRIs and other psychiatric medications.  She would like for some med change if it is possible to give her more relief.  We discussed the short-term risks associated with benzodiazepines including sedation and increased fall risk among others.  She reports better anxiety control with diazepam as opposed to Xanax intially until now.  She does not want to be prescribed  Xanax at any point in the future bc history of dissociation with it.  Discussed long-term side effect risk including dependence, potential withdrawal symptoms, and the potential eventual dose-related risk of dementia.  Evidently developing tolerance.  No higher BZ dose bc of this. Disc use of diazepam for fear of flying upcoming.    Reviewed Genesight results in detail.  Option Viibryd for anxiety  Continue buspirone 15 mg 3 times daily,   Resume mirtazapine per her request at 22.5-45 mg HS.  Marland Kitchen  Has been compliant with Valium.  OK continue Valium 5 mg  QID prn but take LED.  Continue Vitamin D  FU 8 weeks.  Lynder Parents, MD, DFAPA   Please see After Visit Summary for patient  specific instructions.  Future Appointments  Date Time Provider [place]  09/06/2022 10:00 AM Jannifer Hick, [facility] None  09/13/2022 11:00 AM Jannifer Hick, [facility] CP-CP None  09/14/2022 10:30 AM Herring, Theo Dills, RPH [facility] None  09/20/2022  9:00 AM Jannifer Hick, [facility] CP-CP None  10/02/2022 10:00 AM Shanon Ace, LCSW CP-CP None  11/22/2022 10:00 AM Josue Hector, MD [facility]  05/30/2023 10:15 AM [facility]-ANNUAL WELLNESS VISIT [facility]    No orders of the defined types were placed in this encounter.     -------------------------------

## 2022-09-06 ENCOUNTER — Ambulatory Visit: Payer: HMO | Admitting: Behavioral Health

## 2022-09-12 NOTE — Progress Notes (Signed)
Care Management & Coordination Services Pharmacy Note  09/12/2022 Name:  Brittany [name] MRN:  [id number] DOB:  08-03-1955  Summary: -LDL elevated, refuses statins due to suicidal ideation while taking in the past -Reports well-controlled breathing, using rescue inhaler less than once a week -Pt requests updated lipid panel, iron panel, Vit B, Vit D, Magnesium, and CMP at her next OV  Recommendations/Changes made from today's visit: -Scheduled OV for 5/6 with PCP per patient request, with note for desired labs -If LDL still elevated, consider addition of zetia once daily  Follow up plan: General review in 2 months Pharmacist visit in 6 months   Subjective: Brittany [name] is an 67 y.o. year old female who is a primary patient of Farrel Conners, MD.  The care coordination team was consulted for assistance with disease management and care coordination needs.    Engaged with patient by telephone for follow up visit.  Recent office visits: 05/24/22 Rolene Arbour, LPN - For AWV  Recent consult visits: 09/04/22 Lynder Parents, MD [facility]) For PTSD, no other info available  08/21/22 Emily Filbert [facility]) - For Anxiety State - no other info available  07/23/22 Chales Abrahams, MD (GI) For small intestinal bactermia. START Augmentin BID x 14 DAYS  06/02/2022 Freda Jackson, MD (Pulm) - START Advair 250-50 1 puff q12  Hospital visits: None in previous 6 months   Objective:  Lab Results  Component Value Date   CREATININE 0.75 11/17/2021   BUN 16 11/17/2021   GFR 91.52 05/10/2021   EGFR 88 11/17/2021   GFRNONAA >60 05/01/2019   GFRAA >60 05/01/2019   NA 141 11/17/2021   K 4.4 11/17/2021   CALCIUM 9.7 11/17/2021   CO2 25 11/17/2021   GLUCOSE 94 11/17/2021    Lab Results  Component Value Date/Time   HGBA1C 5.1 01/08/2022 11:43 AM   HGBA1C 4.7 04/06/2020 09:16 AM   HGBA1C 4.9 11/28/2018 09:30 AM   GFR 91.52 05/10/2021 09:34 AM   GFR 73.16  02/11/2020 12:37 PM    Last diabetic Eye exam: No results found for: "HMDIABEYEEXA"  Last diabetic Foot exam: No results found for: "HMDIABFOOTEX"   Lab Results  Component Value Date   CHOL 255 (H) 04/23/2022   HDL 71.90 04/23/2022   LDLCALC 167 (H) 04/23/2022   TRIG 81.0 04/23/2022   CHOLHDL 4 04/23/2022       Latest Ref Rng & Units 11/17/2021    8:35 AM 05/10/2021    9:34 AM 04/06/2020    9:16 AM  Hepatic Function  Total Protein 6.0 - 8.5 g/dL 6.6  7.1  6.3   Albumin 3.8 - 4.8 g/dL 4.8  4.7    AST 0 - 40 IU/L 16  15  12    ALT 0 - 32 IU/L 23  14  9    Alk Phosphatase 44 - 121 IU/L 68  58    Total Bilirubin 0.0 - 1.2 mg/dL 0.4  0.4  0.5     Lab Results  Component Value Date/Time   TSH 1.70 04/23/2022 09:39 AM   TSH 1.75 05/10/2021 09:34 AM       Latest Ref Rng & Units 05/10/2021    9:34 AM 04/06/2020    9:16 AM 02/11/2020   12:37 PM  CBC  WBC 4.0 - 10.5 K/uL 4.4  4.7  5.2   Hemoglobin 12.0 - 15.0 g/dL 15.5  15.3  15.3   Hematocrit 36.0 - 46.0 % 45.9  45.7  44.5  Platelets 150.0 - 400.0 K/uL 318.0  273  321.0     Lab Results  Component Value Date/Time   VD25OH 87.95 05/10/2021 09:34 AM   VD25OH 56 04/06/2020 09:16 AM   VD25OH 45.19 11/28/2018 09:30 AM   VITAMINB12 533 05/10/2021 09:34 AM   VITAMINB12 319 04/06/2020 09:16 AM    Clinical ASCVD: No  The 10-year ASCVD risk score (Arnett DK, et al., 2019) is: 5.4%   Values used to calculate the score:     Age: 53 years     Sex: Female     Is Non-Hispanic African American: No     Diabetic: No     Tobacco smoker: No     Systolic Blood Pressure: [id number] mmHg     Is BP treated: No     HDL Cholesterol: 71.9 mg/dL     Total Cholesterol: 255 mg/dL    DEXA - 09/13/21 - Osteopenia, tx not indicated based on FRAX assessment, rescan in 2025     05/24/2022    2:53 PM 05/24/2022    2:51 PM 04/23/2022    8:52 AM  Depression screen PHQ 2/9  Decreased Interest 1 0 2  Down, Depressed, Hopeless 3 0 2  PHQ - 2 Score 4 0 4   Altered sleeping 0 0 2  Tired, decreased energy 0 0 2  Change in appetite 0 0 2  Feeling bad or failure about yourself  1 0 1  Trouble concentrating 0 0 2  Moving slowly or fidgety/restless 0 0 0  Suicidal thoughts 0 0 0  PHQ-9 Score 5 0 13  Difficult doing work/chores Somewhat difficult       Social History   Tobacco Use  Smoking Status Former   Packs/day: 20.00   Years: 1.00   Additional pack years: 0.00   Total pack years: 20.00   Types: Cigarettes   Quit date: 06/19/1999   Years since quitting: 23.2  Smokeless Tobacco Never  Tobacco Comments   20 year estimate but probably less    BP Readings from Last 3 Encounters:  05/23/22 128/84  04/23/22 110/78  04/06/22 110/74   Pulse Readings from Last 3 Encounters:  05/23/22 93  04/23/22 77  04/06/22 85   Wt Readings from Last 3 Encounters:  05/24/22 180 lb (81.6 kg)  05/23/22 185 lb 6.4 oz (84.1 kg)  04/23/22 183 lb 14.4 oz (83.4 kg)   BMI Readings from Last 3 Encounters:  05/24/22 34.01 kg/m  05/23/22 35.03 kg/m  04/23/22 33.64 kg/m    Allergies  Allergen Reactions   Aspirin Other (See Comments)    discomfort   Atorvastatin     Patient states she felt suicidal   Codeine Itching   Crestor [Rosuvastatin]     Dizzy, leg pain and states she felt suicidal   Gabapentin    Niacin And Related Itching   Sulfa Antibiotics     Other reaction(s): Unknown   Sulfamethoxazole-Trimethoprim Other (See Comments)    Unknown reaction per pt   Topiramate Other (See Comments)    Per patient, it causes stomach burning   Trileptal [Oxcarbazepine]     itching   Trimethoprim Other (See Comments)    Unknown, per pt   Ultram [Tramadol] Itching   Vioxx [Rofecoxib]     Unknown per pt    Medications Reviewed Today     Reviewed by Purnell Shoemaker., MD (Psychiatrist) on 09/04/22 at 1029  Med List Status: <None>   Medication Order Taking? Sig Documenting  Provider Last Dose Status Informant  0.9 %  sodium chloride  infusion [id number]   Thornton Park, MD  Active   0.9 %  sodium chloride infusion [id number]   Thornton Park, MD  Active   albuterol (PROVENTIL) (2.5 MG/3ML) 0.083% nebulizer solution [id number] Yes Take 3 mLs (2.5 mg total) by nebulization every 6 (six) hours as needed for wheezing or shortness of breath. Freddi Starr, MD Taking Active   Albuterol Sulfate (PROAIR RESPICLICK) 123XX123 (90 Base) MCG/ACT AEPB [id number] Yes Inhale 2 puffs into the lungs every 6 (six) hours as needed. Freddi Starr, MD Taking Active   Azelastine HCl 137 MCG/SPRAY SOLN [id number] Yes SPRAY 1 SPRAY IN EACH NOSTRIL TWICE DAILY AS DIRECTED Freddi Starr, MD Taking Active   b complex vitamins capsule [id number] Yes Take 1 capsule by mouth daily. [provider] Taking Active   busPIRone (BUSPAR) 15 MG tablet [id number] Yes TAKE ONE TABLET BY MOUTH THREE TIMES A DAY Cottle, Billey Co., MD Taking Active   cetirizine (ZYRTEC) 10 MG tablet [id number] Yes Take 1 tablet (10 mg total) by mouth daily. [name], DO Taking Active   Cholecalciferol (VITAMIN D-3 PO) [id number] Yes Take 10,000 Units by mouth daily.  [provider] Taking Active Self  COLLAGEN PO [id number] Yes Take by mouth. [provider] Taking Active   diazepam (VALIUM) 5 MG tablet [id number] Yes TAKE ONE TABLET BY MOUTH EVERY 6 HOURS AS NEEDED FOR ANXIETY [place], Berdie Ogren, NP Taking Active   dicyclomine (BENTYL) 20 MG tablet [id number] Yes TAKE ONE TABLET BY MOUTH FOUR TIMES A DAY AS NEEDED FOR Marlene Lard, MD Taking Active   docusate sodium (COLACE) 100 MG capsule [id number] Yes Take 1 capsule (100 mg total) by mouth 2 (two) times daily. While taking narcotic pain medicine. Corky Sing, [place] Taking Active   doxazosin (CARDURA) 4 MG tablet [id number] Yes TAKE TWO TABLETS BY MOUTH EVERY NIGHT AT BEDTIME  Patient taking differently: PRN   Cottle, Billey Co., MD Taking Active   famotidine (PEPCID) 20  MG tablet [id number] Yes Take 1 tablet (20 mg total) by mouth 2 (two) times daily.  Patient taking differently: Take 20 mg by mouth as needed.   Freddi Starr, MD Taking Active   ferrous sulfate 325 (65 FE) MG EC tablet [id number] Yes Take 325 mg by mouth 2 (two) times a week. [provider] Taking Active   fluticasone (FLONASE) 50 MCG/ACT nasal spray [id number] Yes Place 2 sprays into both nostrils daily. Martyn Ehrich, NP Taking Active   fluticasone-salmeterol (ADVAIR HFA) [phone] MCG/ACT inhaler [id number] Yes Inhale 2 puffs into the lungs 2 (two) times daily. Freddi Starr, MD Taking Active   fluticasone-salmeterol (ADVAIR) 250-50 MCG/ACT AEPB [id number] Yes Inhale 1 puff into the lungs every 12 (twelve) hours. Freddi Starr, MD Taking Active   MAGNESIUM PO [id number] Yes Calcium citrate, magnesium and vitamin D3 combo [provider] Taking Active   mirtazapine (REMERON) 45 MG tablet [id number] Yes Take 1 tablet (45 mg total) by mouth at bedtime. Cottle, Billey Co., MD Taking Active   montelukast (SINGULAIR) 10 MG tablet [id number] Yes TAKE ONE TABLET BY MOUTH EVERY NIGHT AT BEDTIME Freddi Starr, MD Taking Active   Multiple Vitamin (MULTIVITAMIN WITH MINERALS) TABS tablet [id number] Yes Take 1 tablet by mouth daily. [provider] Taking Active   pantoprazole (PROTONIX) 40 MG tablet [id number] Yes TAKE ONE TABLET BY MOUTH TWICE A DAY  Thornton Park, MD Taking Active   pseudoephedrine-guaifenesin [facility] D) 60-600 MG 12 hr tablet [id number] Yes Take 1 tablet by mouth every 12 (twelve) hours. Martyn Ehrich, NP Taking Active   Red Yeast Rice 600 MG CAPS [id number] Yes Take 1 capsule by mouth daily. [provider] Taking Active   sodium chloride (OCEAN) 0.65 % SOLN nasal spray [id number] Yes Place 1 spray into both nostrils as needed for congestion. [provider] Taking Active             SDOH:  (Social Determinants of  Health) assessments and interventions performed: Yes SDOH Interventions    Flowsheet Row Clinical Support from 05/24/2022 in [place] at [place] Management from 05/19/2021 in [place] at [place] from 04/05/2020 in [place] at [place] Erroneous Telephone Encounter from 09/13/2014 in [place] Interventions Intervention Not Indicated -- -- --  Housing Interventions Intervention Not Indicated -- -- --  Transportation Interventions Intervention Not Indicated Intervention Not Indicated -- --  Utilities Interventions Intervention Not Indicated -- -- --  Alcohol Usage Interventions Intervention Not Indicated (Score <7) -- -- --  Depression Interventions/Treatment  Counseling -- Medication, Currently on Treatment Referral to Psychiatry, Medication, Counseling, Currently on Treatment  Financial Strain Interventions Intervention Not Indicated Intervention Not Indicated -- --  Physical Activity Interventions Intervention Not Indicated -- -- --  Stress Interventions Intervention Not Indicated -- -- --  Social Connections Interventions Intervention Not Indicated -- -- --       Medication Assistance: None required.  Patient affirms current coverage meets needs.  Medication Access: Within the past 30 days, how often has patient missed a dose of medication? None Is a pillbox or other method used to improve adherence? Yes  Factors that may affect medication adherence? adverse effects of medications Are meds synced by current pharmacy? No  Are meds delivered by current pharmacy? No  Does patient experience delays in picking up medications due to transportation concerns? No   [facility] Reviewed: Is patient disadvantaged to use [facility]?: Yes  Current Rx insurance plan: HTA Name and location of Current pharmacy:  [facility] [id number] - [address] [place] [address] [place] [address] Phone: [phone] Fax: [phone]  [facility] services reviewed with patient today?: No  Patient requests to transfer care to [facility]?: No  Reason patient declined to change pharmacies: Not mentioned at this visit  Compliance/Adherence/Medication fill history: Care Gaps: AWV - scheduled 05/24/2022 Last BP - 110/78 on 04/23/2022 Last A1C - 5.1 on 01/08/2022  Star-Rating Drugs: None   Assessment/Plan   Hyperlipidemia: (LDL goal < 100) -Uncontrolled -Current treatment: Red yeast rice otc -Medications previously tried: Colestipol  -Current dietary patterns: trying to follow a low cholesterol diet -Current exercise habits: is going back to the gym today, getting on treadmill -Educated on Cholesterol goals;  Importance of limiting foods high in cholesterol; Exercise goal of 150 minutes per week; - ORDER updated lipid panel at next OV, with PCP approval. If LDL   COPD/[place] (Goal: control symptoms and prevent exacerbations) -Controlled -Current treatment  Proair Resplick prn Appropriate, Effective, Safe, Accessible Advair 250-50 1 every 12 hours Appropriate, Effective, Safe, Accessible -Medications previously tried: Symbicort (stopped due to cost)  -Gold Grade: Gold 2 (FEV1 50-79%) -Current COPD Classification:  A (low sx, 0-1 moderate exacerbations, no hospitalizations) -Pulmonary function  testing: 69% on 10/25/20 -Exacerbations requiring treatment in last 6 months: None -Patient reports consistent use of maintenance inhaler -Frequency of rescue inhaler use: less than once a week -Counseled on Proper inhaler technique; Benefits of consistent maintenance inhaler use When to use rescue inhaler -Recommended to continue current medication  [place] Pharmacist [phone]

## 2022-09-13 ENCOUNTER — Ambulatory Visit (INDEPENDENT_AMBULATORY_CARE_PROVIDER_SITE_OTHER): Payer: HMO | Admitting: Behavioral Health

## 2022-09-13 ENCOUNTER — Telehealth: Payer: Self-pay

## 2022-09-13 DIAGNOSIS — F3162 Bipolar disorder, current episode mixed, moderate: Secondary | ICD-10-CM

## 2022-09-13 NOTE — Progress Notes (Signed)
Care Management & Coordination Services Pharmacy Team  Reason for Encounter: Appointment Reminder  Contacted patient to confirm telephone appointment with Burman Riis, PharmD on 09/13/2021 at 10:30. Spoke with patient on 09/13/2022   Do you have any problems getting your medications? No  What is your top health concern you would like to discuss at your upcoming visit? Patient would like to get some labs to see if she may be deficient for vitamins and minerals.  Have you seen any other providers since your last visit with PCP? Yes, Patient has seen Dr Clovis Cao, GI at [facility].   Care Gaps: AWV - completed 05/24/2022,  scheduled 05/30/2023 Last BP - 128/84 on 05/23/2022 Last A1C - 5.1 on 01/08/2022 Covid - overdue  Star Rating Drugs: None  [place] Pharmacist Assistant [phone]

## 2022-09-13 NOTE — Progress Notes (Signed)
[facility] Counselor/Therapist Progress Note  Patient ID: Brittany [name], MRN: [id number],    Date: 09/13/2022  Time Spent: 60 minutes   Treatment Type: Individual Therapy  Reported Symptoms: Depression   Mental Status Exam:  Appearance:   Casual     Behavior:  Appropriate and Sharing  Motor:  Normal  Speech/Language:   Clear and Coherent  Affect:  Appropriate and Congruent  Mood:  normal  Thought process:  normal  Thought content:    WNL  Sensory/Perceptual disturbances:    WNL  Orientation:  oriented to person  Attention:  Good  Concentration:  Good  Memory:  WNL  Fund of knowledge:   Good  Insight:    Good  Judgment:   Good  Impulse Control:  Good   Risk Assessment: Danger to Self:  No Self-injurious Behavior: No Danger to Others: No Duty to Warn:no Physical Aggression / Violence:No  Access to Firearms a concern: No  Gang Involvement:No   Subjective:   The patient reports she has been busy getting out into the community. She reports continued interest with getting her hair cut and donating it to a Science writer. The patient expressed gratitude with finding a church home and she states her family also attends the church. She reports feeling excited about attending the church. She plans to start working with a Physiological scientist at the gym one day a week. The patient states "I am trying to get up and get out and get out of my comfort zone". The patient reports she is attempting to decrease her cholesterol level.   The patient rated depression at a 3 today on a scale of 0 to 10 with 10 being severe. She is coping by staying busy inside and outside her home. She reported no anxiety today. She reports having a better perspective regarding life situations. She is utilizing positive self talk to assist her with managing her moods. The patient reports experiencing fear of "Getting everything right, getting everything done" regarding obtaining information to take  care of her housing re-certification. She expressed gratitude with going to a local store that typically is crowded and not avoiding going into the store to purchase what she needed.  The patient states she is managing panic episodes. She reports to continue to have a fear of being a burden to her daughter.   She reports her mood stability has improved she reports "Having therapy has been a tremendous help". She states to utilize a coping strategy of journaling to help her manage her mood stability. The patient denied SI/HI, AH/VH. She presents with understanding that this counselor's time is limited with [facility]. She is receptive of the counselor she will transition to for therapy.    Counselor conducted check in. Counselor utilized reflective listening and validated the patient. Counselor questioned mental health symptoms and coping strategies utilized. Counselor reviewed the patients treat plan goals for therapy and discussed progress with the patients goals. Counselor assessed for SI/HI, AH/VH. Counselor reminded the patient of what counselor she will transition to in order to receive continuity of care.     Interventions: Motivational Interviewing  Diagnosis:   ICD-10-CM   1. Bipolar 1 disorder, mixed, moderate (HCC)  F31.62       Plan:   The patient reports experiencing "Fear, anxiety sometimes leads to panic attacks, fear leads to panic attacks and it lead to depression". The patient reports to experience fear of being a burden to her daughter. She  reports interest to work on coping strategies to address fear and anxiety.    1) Long Term Goal: Develop strategies to reduce symptoms.     Short Term Goal: Reduce anxiety and improve coping skills.     Objective: Learn two new ways of coping with daily stressors.     Objective: Develop strategies for thought distraction when fixating on the future.     Objective: Manage panic episodes.   2) Long Term Goal: Maintain  stability of mood.     Short Term Goal: Increase ability to manage moods.     Objective: Discuss and resolve troubling personal and interpersonal issues.       Jannifer Hick, [facility]

## 2022-09-14 ENCOUNTER — Ambulatory Visit: Payer: PPO

## 2022-09-16 ENCOUNTER — Encounter: Payer: Self-pay | Admitting: Behavioral Health

## 2022-09-20 ENCOUNTER — Ambulatory Visit: Payer: HMO | Admitting: Behavioral Health

## 2022-09-27 ENCOUNTER — Ambulatory Visit: Payer: HMO | Admitting: Behavioral Health

## 2022-10-02 ENCOUNTER — Ambulatory Visit (INDEPENDENT_AMBULATORY_CARE_PROVIDER_SITE_OTHER): Payer: HMO | Admitting: Psychiatry

## 2022-10-02 DIAGNOSIS — F411 Generalized anxiety disorder: Secondary | ICD-10-CM

## 2022-10-02 NOTE — Progress Notes (Signed)
[facility] Counselor/Therapist Progress Note  Patient ID: Brittany [name], MRN: [id number],    Date: 10/02/2022  Time Spent: 55 minutes  Treatment Type: Individual Therapy  Reported Symptoms: anxiety, fear triggering anxiety, depression improved some, don't like surprises  Mental Status Exam:  Appearance:   Casual     Behavior:  Appropriate, Sharing, and Motivated  Motor:  Normal  Speech/Language:   Clear and Coherent  Affect:  anxious  Mood:  anxious  Thought process:  goal directed  Thought content:    Some "episodic obsessive thinking"  Sensory/Perceptual disturbances:    WNL  Orientation:  oriented to person, place, time/date, situation, day of week, month of year, year, and stated date of October 02, 2022  Attention:  Fair  Concentration:  Good and Fair  Memory:  Short term memory issues but hasn't needed meds  Fund of knowledge:   Good  Insight:    Good and Fair  Judgment:   Good and Fair  Impulse Control:  Fair   Risk Assessment: Danger to Self:  No Self-injurious Behavior: No Danger to Others: No Duty to Warn:no Physical Aggression / Violence:No  Access to Firearms a concern: No  Gang Involvement:No   Subjective: Patient today seen as a transfer from another therapist who moved out of town.  Needed session today to get significant information and history from patient, as well as hear her priorities. Is divorced, a son 72 married with 4 kids, a daughter 2 married and 1 child. Close to family. Faith is important and currently watches services online. [facility] (has gone with family  before). Has had some history of impulsiveness with over-spending and is currently working to stop that behavior. Processed some measures that can be useful to patient in controlling her being able to over-spend. Also reporting how her fear and anxiety can lead to panic attacks although not recently.  Often when it does lead to panic attacks she will end up depressed.  Has  concerns about not wanting to be a burden to her daughter into the future.  Does seem motivated to work with increasing her coping strategies especially in using them to reduce fear and anxiety.  Patient seemed comfortable today in session even though it was our first 1 together and also talked in ways that support good internal motivation.  Interventions: Cognitive Behavioral Therapy, Solution-Oriented/Positive Psychology, and Ego-Supportive  1) Long Term Goal: Develop strategies to reduce symptoms.     Short Term Goal: Reduce anxiety and improve coping skills.     Objective: Learn two new ways of coping with daily stressors.     Objective: Develop strategies for thought distraction when fixating on the future.     Objective: Manage panic episodes.   2) Long Term Goal: Maintain stability of mood     Short Term Goal: Increase ability to manage moods.     Objective: Discuss and resolve troubling personal and interpersonal issues.    Diagnosis:   ICD-10-CM   1. Anxiety state  F41.1      Plan:  Patient in today as a transfer from her prior therapist who moved away recently. Very grounded in her faith and feels "I couldn't have gotten this far along without His help." States loud, unexpected noises triggers her fears and anxious responses. Discussed treatment goals with patient and she will continue with same goals she has just begun with previous therapist. Shella Maxim some personal traits that she feels would be helpful for  me to know, including her journey to "go out on FMLA and eventually get on disability." The first 3 yrs after that "I felt like a failure not working." But "I'm much better now with that" and doesn't negatively judge herself. Concerns now: "staying stable as much as possible, overcoming the fear and anxiety and learn to better manage it."  Patient motivated and needs to continue working with goal-directed behaviors in order to move in a forward direction.  Goal review and  progress/challenges noted with patient.  Next appointment within 2 to 3 weeks.  This record has been created using [employer].  Chart creation errors have been sought, but may not always have been located and corrected.  Such creation errors do not reflect on the standard of medical care provided.   Mathis Fare, LCSW

## 2022-10-11 ENCOUNTER — Other Ambulatory Visit: Payer: Self-pay | Admitting: Pulmonary Disease

## 2022-10-12 ENCOUNTER — Ambulatory Visit (INDEPENDENT_AMBULATORY_CARE_PROVIDER_SITE_OTHER): Payer: HMO | Admitting: Psychiatry

## 2022-10-12 DIAGNOSIS — F3162 Bipolar disorder, current episode mixed, moderate: Secondary | ICD-10-CM

## 2022-10-12 NOTE — Progress Notes (Addendum)
[facility] Counselor/Therapist Progress Note  Patient ID: Brittany [name], MRN: [id number],    Date: 10/12/2022  Time Spent: 50 minutes   Treatment Type: Individual Therapy  Reported Symptoms: anxiety  Mental Status Exam:  Appearance:   Casual     Behavior:  Appropriate, Sharing, and Motivated  Motor:  Normal  Speech/Language:   Clear and Coherent  Affect:  Depressed and anxious  Mood:  anxious and depressed  Thought process:  goal directed  Thought content:    WNL  Sensory/Perceptual disturbances:    WNL  Orientation:  oriented to person, place, time/date, situation, day of week, month of year, year, and stated date of October 12, 2022  Attention:  Good  Concentration:  Good  Memory:  WNL  Fund of knowledge:   Good  Insight:    Good and Fair  Judgment:   Fair  Impulse Control:  Good   Risk Assessment: Danger to Self:  No Self-injurious Behavior: No Danger to Others: No Duty to Warn:no Physical Aggression / Violence:No  Access to Firearms a concern: No  Gang Involvement:No   Subjective:   Patient in session today and reporting anxiety and some depression including about her asthma which worsens this time of year. Has to remain masked and carries her nebulizer and rescue inhaler at all times. "For a while I was doing better as I was feeling more hopeful and joyful but since my asthma has worsened significantly, that has affected my anxiety and depression. States it helped her to to to church recently and "took the risk to attend a women's Bible study" she had wanted to attend but had been very nervous. I went and it went really well, they were welcoming. "But it was a good distance away", but not letting that discourage her. "My faith is important to me" and shared more today about how she relies on her faith as she works on her emotional issues. Continues to work on decreasing her impulsiveness and over-spending and feeling some increased optimism. Had a panic attack  since last appt and able to talk herself through it. Continues trying to not focus as much on the negatives and the fears, and be able to interrupt some of her anxiety rather than being overpowered by it. Very hard to let go of assumptions that "things will go wrong" although states "I do want to live my life and not just exist." Was able to become calmer the more she spoke and seemed to feel some increase I confidence and felt better about herself. Reported anxiety and fear were not as strong by end of session. Shares that daughter has noticed some of her "positives" also but patient "not really seeing the myself but starting to feel some changes." To continue working on fear and anxiety reduction, and believing more in herself to pursue the changes she is seeking. Showing increased motivation.  Interventions: Cognitive Behavioral Therapy, Solution-Oriented/Positive Psychology, and Ego-Supportive  1) Long Term Goal: Develop strategies to reduce symptoms.     Short Term Goal: Reduce anxiety and improve coping skills.     Objective: Learn two new ways of coping with daily stressors.     Objective: Develop strategies for thought distraction when fixating on the future.     Objective: Manage panic episodes.   2) Long Term Goal: Maintain stability of mood     Short Term Goal: Increase ability to manage moods.     Objective: Discuss and resolve troubling personal and interpersonal  issues.   Diagnosis:   ICD-10-CM   1. Bipolar 1 disorder, mixed, moderate (HCC)  F31.62      Plan:   Patient today showing good participation in session and also some increased self-confidence and being more open in session.  Feeling a little more confident, more grounded at times, not quite as on edge, and working to let go of prior feelings of being a failure.  Needs to continue with goal-directed behaviors so as to move in a forward direction. Encouraged patient and her use of positive and self affirming statements and  behaviors including: Refrain from identifying herself as a failure, refrain from negative self judgment, stay in contact with supportive people, stay involved in activities that she knows helps her maintain stability in her mood, refrain from self negating, continue work on her strategies and thought distraction and not get fixated on the future, be able to live more in the present, emphasize her positives, give herself a "timeout" as needed, saying no without feeling guilty, let her faith be a resource for her emotionally as well as spiritually, practice "the pause" when needed, believing herself more, understand that her thoughts are "just thoughts" and are not necessarily going to play out in reality the way she might fear, pay attention to small steps of progress, and realize the strength she shows working with goal-directed behaviors to move in a direction of overall improved emotional health and wellbeing.  Self rating scales: 1-10 depression scale-6 1-10 anxiety scale-7/8 1-10 hopefulness scale-5/6  Goal Review and progress/challenges noted with patient.  Next appointment within 2 to 3 weeks.   Mathis Fare, LCSW

## 2022-10-15 ENCOUNTER — Telehealth: Payer: Self-pay | Admitting: Pulmonary Disease

## 2022-10-15 NOTE — Telephone Encounter (Signed)
PT states she is out of Fluticason. Nurse called in 2 sprays twice a day but she needed only one spray once a day so not she is out.  [facility] is: [facility] on [address]   Her # is [phone]

## 2022-10-17 ENCOUNTER — Other Ambulatory Visit: Payer: Self-pay | Admitting: Pulmonary Disease

## 2022-10-17 MED ORDER — FLUTICASONE PROPIONATE 50 MCG/ACT NA SUSP: 1.0000 | Freq: Every day | NASAL | 2 refills | Status: DC

## 2022-10-17 MED ORDER — FLUTICASONE PROPIONATE 50 MCG/ACT NA SUSP: 2.0000 | Freq: Two times a day (BID) | NASAL | 2 refills | Status: DC

## 2022-10-17 NOTE — Telephone Encounter (Signed)
Rx had been sent for 1 spray daily. Called and spoke with pt about this and she said instructions were supposed to be for 2 sprays twice daily. Called the pharmacy and gave a verbal to have instructions changed to 2 sprays twice daily. Have changed instructions on pt's medlist and put that this was phoned in since I verbally gave these instructions to pharmacy. Nothing further needed.

## 2022-10-23 ENCOUNTER — Encounter: Payer: Self-pay | Admitting: Family Medicine

## 2022-10-23 ENCOUNTER — Ambulatory Visit (INDEPENDENT_AMBULATORY_CARE_PROVIDER_SITE_OTHER): Payer: HMO | Admitting: Family Medicine

## 2022-10-23 VITALS — BP 100/68 | HR 80 | Temp 98.0°F | Ht 61.0 in | Wt 169.9 lb

## 2022-10-23 DIAGNOSIS — K219 Gastro-esophageal reflux disease without esophagitis: Secondary | ICD-10-CM

## 2022-10-23 DIAGNOSIS — E782 Mixed hyperlipidemia: Secondary | ICD-10-CM | POA: Diagnosis not present

## 2022-10-23 DIAGNOSIS — R5383 Other fatigue: Secondary | ICD-10-CM

## 2022-10-23 DIAGNOSIS — M8580 Other specified disorders of bone density and structure, unspecified site: Secondary | ICD-10-CM

## 2022-10-23 DIAGNOSIS — T466X5A Adverse effect of antihyperlipidemic and antiarteriosclerotic drugs, initial encounter: Secondary | ICD-10-CM

## 2022-10-23 DIAGNOSIS — G720 Drug-induced myopathy: Secondary | ICD-10-CM | POA: Diagnosis not present

## 2022-10-23 LAB — VITAMIN B12: Vitamin B-12: 447 pg/mL (ref 211–911)

## 2022-10-23 LAB — LIPID PANEL
Cholesterol: 259 mg/dL — ABNORMAL HIGH (ref 0–200)
HDL: 57.6 mg/dL (ref >39.00)
LDL Cholesterol: 187 mg/dL — ABNORMAL HIGH (ref 0–99)
NonHDL: 201.66
Total CHOL/HDL Ratio: 5
Triglycerides: 74 mg/dL (ref 0.0–149.0)
VLDL: 14.8 mg/dL (ref 0.0–40.0)

## 2022-10-23 LAB — VITAMIN D 25 HYDROXY (VIT D DEFICIENCY, FRACTURES): VITD: 77.22 ng/mL (ref 30.00–100.00)

## 2022-10-23 MED ORDER — PANTOPRAZOLE SODIUM 40 MG PO TBEC: 40.0000 mg | DELAYED_RELEASE_TABLET | Freq: Every day | ORAL | 3 refills | Status: DC

## 2022-10-23 NOTE — Assessment & Plan Note (Signed)
Pt requesting vitamin D level today, orders placed.

## 2022-10-23 NOTE — Progress Notes (Signed)
Established Patient Office Visit  Subjective   Patient ID: Brittany [name], female    DOB: 30-Dec-1955  Age: 67 y.o. MRN: [id number]  Chief Complaint  Patient presents with   Medical Management of Chronic Issues    Patient is here for follow up today. She reports that she wants to make sure she is taking the correct supplements and check up on her nutrition. She reports that she feels very sluggish, states that she feels she needs to "make herself" do things physically.    Pt states she would like to have her cholesterol rechecked. States that she used to be on cholestipol for her bowels in the past, cannot tolerate statins due to muscle cramps/pain. States she has been exercising more, taking red yeast rice and garlic capsules, also she has lost about 16 pounds and she is watching her diet well.   GERD-- pt reports she is cutting back on the pantoprazole, states that she will have occasional flare ups of her heartburn but she only takes the PPI during those times of flare up.    Current Outpatient Medications  Medication Instructions   albuterol (PROVENTIL) 2.5 mg, Nebulization, Every 6 hours PRN   Albuterol Sulfate (PROAIR RESPICLICK) 108 (90 Base) MCG/ACT AEPB 2 puffs, Inhalation, Every 6 hours PRN   Azelastine HCl 137 MCG/SPRAY SOLN SPRAY 1 SPRAY IN EACH NOSTRIL TWICE DAILY AS NEEDED   b complex vitamins capsule 1 capsule, Oral, Daily   busPIRone (BUSPAR) 15 mg, Oral, 3 times daily   cetirizine (ZYRTEC) 10 mg, Oral, Daily   Cholecalciferol (VITAMIN D-3 PO) 10,000 Units, Oral, Daily   COLLAGEN PO Oral   diazepam (VALIUM) 5 mg, Oral, Every 6 hours PRN, for anxiety   dicyclomine (BENTYL) 20 MG tablet TAKE ONE TABLET BY MOUTH FOUR TIMES A DAY AS NEEDED FOR SPASMS   doxazosin (CARDURA) 8 mg, Oral, Nightly   famotidine (PEPCID) 20 mg, Oral, 2 times daily   ferrous sulfate 325 mg, Oral, 2 times weekly   fluticasone (FLONASE) 50 MCG/ACT nasal spray 2 sprays, Each Nare, 2 times daily    fluticasone-salmeterol (ADVAIR) 250-50 MCG/ACT AEPB 1 puff, Inhalation, Every 12 hours   MAGNESIUM PO Calcium citrate, magnesium and vitamin D3 combo   mirtazapine (REMERON) 45 mg, Oral, Daily at bedtime   montelukast (SINGULAIR) 10 MG tablet TAKE ONE TABLET BY MOUTH EVERY NIGHT AT BEDTIME   Multiple Vitamin (MULTIVITAMIN WITH MINERALS) TABS tablet 1 tablet, Oral, Daily   pantoprazole (PROTONIX) 40 mg, Oral, Daily   pseudoephedrine-guaifenesin (MUCINEX D) 60-600 MG 12 hr tablet 1 tablet, Oral, Every 12 hours   Red Yeast Rice 600 MG CAPS 1 capsule, Oral, Daily   sodium chloride (OCEAN) 0.65 % SOLN nasal spray 1 spray, Each Nare, As needed    Patient Active Problem List   Diagnosis Date Noted   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   Healthy adult on routine physical examination 04/25/2022   Asthma exacerbation 04/09/2022   Small intestinal bacterial overgrowth (SIBO) 04/09/2022   Fatigue 01/08/2022   Morbid obesity (HCC) 01/08/2022   Pulmonary fibrosis (HCC) 04/06/2020   Dizziness 02/11/2020   Melena 04/22/2019   Suprapubic abdominal pain 04/22/2019   Osteopenia 12/04/2016   Chronic post-traumatic stress disorder (PTSD) 08/23/2016   Bipolar affective disorder, current episode depressed (HCC) 08/23/2016   Hyperlipidemia 08/23/2016   COPD / AB vs ACOS 06/22/2016   Cough variant asthma  vs UACS  06/21/2016   GERD 04/03/2007      Review of Systems  All other systems reviewed and are negative.     Objective:     BP 100/68 (BP Location: Left Arm, Patient Position: Sitting, Cuff Size: Large)   Pulse 80   Temp 98 F (36.7 C) (Oral)   Ht 5\' 1"  (1.549 m)   Wt 169 lb 14.4 oz (77.1 kg)   SpO2 98%   BMI 32.10 kg/m    Physical Exam Vitals reviewed.  Constitutional:      Appearance: Normal appearance. She is well-groomed and normal weight.  Eyes:     Conjunctiva/sclera: Conjunctivae normal.  Neck:     Thyroid: No thyromegaly.  Cardiovascular:     Rate and Rhythm: Normal rate and  regular rhythm.     Pulses: Normal pulses.     Heart sounds: S1 normal and S2 normal.  Pulmonary:     Effort: Pulmonary effort is normal.     Breath sounds: Normal breath sounds and air entry.  Abdominal:     General: Bowel sounds are normal.  Musculoskeletal:     Right lower leg: No edema.     Left lower leg: No edema.  Neurological:     Mental Status: She is alert and oriented to person, place, and time. Mental status is at baseline.     Gait: Gait is intact.  Psychiatric:        Mood and Affect: Mood and affect normal.        Speech: Speech normal.        Behavior: Behavior normal.        Judgment: Judgment normal.      No results found for any visits on 10/23/22.    The 10-year ASCVD risk score (Arnett DK, et al., 2019) is: 4.4%    Assessment & Plan:  Statin myopathy  Mixed hyperlipidemia Assessment & Plan: Needs new lipid panel today in follow up, cannot tolerate statins.   Orders: -     Lipid panel  Other fatigue Assessment & Plan: Pt requesting B12 level checked today, orders placed, this is a chronic issue that is ongoing.   Orders: -     Vitamin B12  Osteopenia, unspecified location Assessment & Plan: Pt requesting vitamin D level today, orders placed.   Orders: -     VITAMIN D 25 Hydroxy (Vit-D Deficiency, Fractures)  Gastroesophageal reflux disease, unspecified whether esophagitis present Assessment & Plan: Pt on once daily pantoprazole 40 mg, she uses it as needed for her symptoms, well controlled, refilled rx .  Orders: -     Pantoprazole Sodium; Take 1 tablet (40 mg total) by mouth daily.  Dispense: 90 tablet; Refill: 3     Return in about 6 months (around 04/25/2023).    Karie Georges, MD

## 2022-10-23 NOTE — Assessment & Plan Note (Signed)
Pt requesting B12 level checked today, orders placed, this is a chronic issue that is ongoing.

## 2022-10-23 NOTE — Assessment & Plan Note (Addendum)
Needs new lipid panel today in follow up, cannot tolerate statins.

## 2022-10-23 NOTE — Assessment & Plan Note (Signed)
Pt on once daily pantoprazole 40 mg, she uses it as needed for her symptoms, well controlled, refilled rx .

## 2022-10-24 ENCOUNTER — Encounter: Payer: Self-pay | Admitting: Family Medicine

## 2022-10-26 ENCOUNTER — Ambulatory Visit (INDEPENDENT_AMBULATORY_CARE_PROVIDER_SITE_OTHER): Payer: HMO | Admitting: Psychiatry

## 2022-10-26 DIAGNOSIS — F3162 Bipolar disorder, current episode mixed, moderate: Secondary | ICD-10-CM

## 2022-10-26 NOTE — Progress Notes (Signed)
[facility] Counselor/Therapist Progress Note  Patient ID: Brittany [name], MRN: [id number],    Date: 10/26/2022  Time Spent: 50 minutes   Treatment Type: Individual Therapy  Reported Symptoms: anxiety, depression  Mental Status Exam:  Appearance:   Casual     Behavior:  Appropriate, Sharing, and Motivated  Motor:  Normal  Speech/Language:   Clear and Coherent  Affect:  Appropriate  Mood:  anxious and depressed  Thought process:  goal directed  Thought content:    Some obsessiveness  Sensory/Perceptual disturbances:    WNL  Orientation:  oriented to person, place, time/date, situation, day of week, month of year, year, and stated date of Oct 26, 2022  Attention:  Good  Concentration:  Good and Fair  Memory:  WNL  Fund of knowledge:   Good  Insight:    Good and Fair  Judgment:   Good and Fair  Impulse Control:  Good and Fair   Risk Assessment: Danger to Self:  No Self-injurious Behavior: No Danger to Others: No Duty to Warn:no Physical Aggression / Violence:No  Access to Firearms a concern: No  Gang Involvement:No   Subjective:  Patient in session today reporting anxiety and depression mostly related to personal, family, health, and finances. Very difficult to say "no" especially within family. I feel that others have not treated me well throughout the years and it's hard to me to say "no" to anybody. Remains concerned about her asthma especially this time of year as it can also increase my anxiety. As part of working on her anxiety and depression, patient focusing on her difficulty saying "no" to others as it leads to her feeling more anxious and depressed, and fears of rejection. "Tends to let a lot of things get me down". Involvement in women's Bible study which she feels is helpful for her but difficulty not letting other things get in the way, but then feels overwhelmed. Happens often with over-committing, gets caught up in this and ends up frustrated. Shared more  about growing up in foster care with her sister when they were young, including some bad experiences. Wanting to get back going to the gym a couple times a week. Worked very specifically on healthier boundaries and ways she can begin to put these into action. Hard to not assume negatives. Interrupted those some today and re-focused on healthier/more positive thoughts, which is what patient says she wants to be able to do more often.  Patient to continue working on fear and anxiety reduction, believing in herself more, following through on suggestions and therapy, and understand better that setting limits and boundaries with other people is a Chiropractor.  Interventions: Cognitive Behavioral Therapy and Ego-Supportive  1) Long Term Goal: Develop strategies to reduce symptoms.     Short Term Goal: Reduce anxiety and improve coping skills.     Objective: Learn two new ways of coping with daily stressors.     Objective: Develop strategies for thought distraction when fixating on the future.     Objective: Manage panic episodes.   2) Long Term Goal: Maintain stability of mood     Short Term Goal: Increase ability to manage moods.     Objective: Discuss and resolve troubling personal and interpersonal issues.    Diagnosis:   ICD-10-CM   1. Bipolar 1 disorder, mixed, moderate (HCC)  F31.62      Plan:   Patient today participating well in session as she worked further on her anxiety, depression,  need to set limits, and understanding boundaries better.  She is making progress and needs to continue working with goal-directed behaviors to move in a forward direction. Encouraged patient in using more positive and self affirming statements/behaviors including: Refrain from identifying herself as a "failure", refrain from negative self judgment, stay in contact with supportive people, remain involved in activities that she knows helps her maintain stability in her mood, refrain from self negating,  continue working on her strategies and thought distraction and not get fixated on the future, be able to live more in the present, emphasize her positives, give herself a "timeout" as needed, saying no without feeling guilty, letting her faith be a resource for her emotionally as well as spiritually, practice "the pause" when needed, believing more in herself, understand that her thoughts are "just thoughts" and are not necessarily going to play out in reality the way she might fear, pay attention to small steps of progress, and recognize the strengths she shows working with goal-directed behaviors to move in a direction of improved overall emotional health and outlook.  Self rating scales: 1-10 depression scale-5/6 1-10 anxiety scale-7/8 1-10 hopefulness scale-5/6 1-10 self-esteem scale-5  Goal review and progress/challenges noted with patient.  Next appointment within 2 to 3 weeks.   Mathis Fare, LCSW

## 2022-11-01 ENCOUNTER — Telehealth: Payer: Self-pay

## 2022-11-01 NOTE — Progress Notes (Signed)
[facility] Management [facility] Coordination Services Pharmacy Team  Reason for Encounter: General adherence update   Contacted patient for general health update and medication adherence call.  Spoke with patient on 11/01/2022    What concerns do you have about your medications? Patient denies.   She mentions she has been staying strict with her diet and increased exercise, hoping her cholesterol labs will be good at next check.  The patient denies side effects with their medications.   Are you having any problems getting your medications from your pharmacy? Patient denies  Since last visit with PharmD, no interventions have been made.   The patient has not had an ED visit since last contact.   The patient denies problems with their health. Patients digestive issues.   Patient denies concerns or questions for Delano Metz, PharmD at this time.   Care Gaps: AWV - completed 05/24/2022 Last BP - 100/68 on 10/23/2022 Last A1C - 5.1 on 01/08/2022 Covid - postponed   Star Rating Drugs: None   Chart Updates:  Recent office visits:  10/23/2022 Nira Conn MD - Patient was seen for statin myopathy and additional concerns. Increased Advair HFA to 250/50 mcg/act. Decreased Pantoprazole 40 mg to daily. Discontinued Colase.   Recent consult visits:  None  Hospital visits:  None  Medications: Outpatient Encounter Medications as of 11/01/2022  Medication Sig   albuterol (PROVENTIL) (2.5 MG/3ML) 0.083% nebulizer solution Take 3 mLs (2.5 mg total) by nebulization every 6 (six) hours as needed for wheezing or shortness of breath.   Albuterol Sulfate (PROAIR RESPICLICK) 108 (90 Base) MCG/ACT AEPB Inhale 2 puffs into the lungs every 6 (six) hours as needed.   Azelastine HCl 137 MCG/SPRAY SOLN SPRAY 1 SPRAY IN EACH NOSTRIL TWICE DAILY AS NEEDED   b complex vitamins capsule Take 1 capsule by mouth daily.   busPIRone (BUSPAR) 15 MG tablet Take 1 tablet (15 mg total) by mouth 3 (three) times daily.    cetirizine (ZYRTEC) 10 MG tablet Take 1 tablet (10 mg total) by mouth daily.   Cholecalciferol (VITAMIN D-3 PO) Take 10,000 Units by mouth daily.    COLLAGEN PO Take by mouth.   diazepam (VALIUM) 5 MG tablet Take 1 tablet (5 mg total) by mouth every 6 (six) hours as needed. for anxiety   dicyclomine (BENTYL) 20 MG tablet TAKE ONE TABLET BY MOUTH FOUR TIMES A DAY AS NEEDED FOR SPASMS   doxazosin (CARDURA) 4 MG tablet Take 2 tablets (8 mg total) by mouth at bedtime.   famotidine (PEPCID) 20 MG tablet Take 1 tablet (20 mg total) by mouth 2 (two) times daily. (Patient taking differently: Take 20 mg by mouth as needed.)   ferrous sulfate 325 (65 FE) MG EC tablet Take 325 mg by mouth 2 (two) times a week.   fluticasone (FLONASE) 50 MCG/ACT nasal spray Place 2 sprays into both nostrils in the morning and at bedtime.   fluticasone-salmeterol (ADVAIR) 250-50 MCG/ACT AEPB Inhale 1 puff into the lungs every 12 (twelve) hours.   MAGNESIUM PO Calcium citrate, magnesium and vitamin D3 combo   mirtazapine (REMERON) 45 MG tablet Take 1 tablet (45 mg total) by mouth at bedtime.   montelukast (SINGULAIR) 10 MG tablet TAKE ONE TABLET BY MOUTH EVERY NIGHT AT BEDTIME   Multiple Vitamin (MULTIVITAMIN WITH MINERALS) TABS tablet Take 1 tablet by mouth daily.   pantoprazole (PROTONIX) 40 MG tablet Take 1 tablet (40 mg total) by mouth daily.   pseudoephedrine-guaifenesin (MUCINEX D) 60-600 MG 12 hr  tablet Take 1 tablet by mouth every 12 (twelve) hours.   Red Yeast Rice 600 MG CAPS Take 1 capsule by mouth daily.   sodium chloride (OCEAN) 0.65 % SOLN nasal spray Place 1 spray into both nostrils as needed for congestion.   Facility-Administered Encounter Medications as of 11/01/2022  Medication   0.9 %  sodium chloride infusion   0.9 %  sodium chloride infusion  Fill History:   Dispensed Days Supply Quantity Provider Pharmacy  ALBUTEROL SULFATE 2.5 MG /3 ML (0.083 %) VIAL, NEBULIZER (ML) 08/26/2021 6 75 each       Dispensed Days Supply Quantity Provider Pharmacy  AZELASTINE 137 MCG (0.1 %) AEROSOL, SPRAY WITH PUMP (ML) 10/12/2022 50 30 mL      Dispensed Days Supply Quantity Provider Pharmacy  BUSPIRONE 15 MG TABLET 10/24/2022 100 300 tablet      Dispensed Days Supply Quantity Provider Pharmacy  DIAZEPAM 5 MG TABLET 10/17/2022 30 120 tablet      Dispensed Days Supply Quantity Provider Pharmacy  DICYCLOMINE 20 MG TABLET 09/13/2022 90 360 tablet      Dispensed Days Supply Quantity Provider Pharmacy  DOXAZOSIN 4 MG TABLET 10/19/2022 100 200 tablet      Dispensed Days Supply Quantity Provider Pharmacy  FLUTICASONE PROPIONATE 50 MCG/ACTUATION SPRAY, SUSPENSION 10/17/2022 90 96 mL      Dispensed Days Supply Quantity Provider Pharmacy  FLUTICASONE PROPION-SALMETEROL 250-50 MCG/DOSE BLISTER, WITH INHALATION DEVICE 10/14/2022 30 60 each      Dispensed Days Supply Quantity Provider Pharmacy  MONTELUKAST 10 MG TABLET 10/18/2022 90 90 tablet      Dispensed Days Supply Quantity Provider Pharmacy  PANTOPRAZOLE 40 MG TABLET, DELAYED RELEASE (ENTERIC COATED) 09/13/2022 90 180 tablet     Recent vitals BP Readings from Last 3 Encounters:  10/23/22 100/68  05/23/22 128/84  04/23/22 110/78   Pulse Readings from Last 3 Encounters:  10/23/22 80  05/23/22 93  04/23/22 77   Wt Readings from Last 3 Encounters:  10/23/22 169 lb 14.4 oz (77.1 kg)  05/24/22 180 lb (81.6 kg)  05/23/22 185 lb 6.4 oz (84.1 kg)   BMI Readings from Last 3 Encounters:  10/23/22 32.10 kg/m  05/24/22 34.01 kg/m  05/23/22 35.03 kg/m    Recent lab results    Component Value Date/Time   NA 141 11/17/2021 0835   K 4.4 11/17/2021 0835   CL 102 11/17/2021 0835   CO2 25 11/17/2021 0835   GLUCOSE 94 11/17/2021 0835   GLUCOSE 97 05/10/2021 0934   BUN 16 11/17/2021 0835   CREATININE 0.75 11/17/2021 0835   CREATININE 0.94 04/06/2020 0916   CALCIUM 9.7 11/17/2021 0835    Lab Results  Component Value Date   CREATININE 0.75  11/17/2021   GFR 91.52 05/10/2021   EGFR 88 11/17/2021   GFRNONAA >60 05/01/2019   GFRAA >60 05/01/2019   Lab Results  Component Value Date/Time   HGBA1C 5.1 01/08/2022 11:43 AM   HGBA1C 4.7 04/06/2020 09:16 AM   HGBA1C 4.9 11/28/2018 09:30 AM    Lab Results  Component Value Date   CHOL 259 (H) 10/23/2022   HDL 57.60 10/23/2022   LDLCALC 187 (H) 10/23/2022   TRIG 74.0 10/23/2022   CHOLHDL 5 10/23/2022    Inetta Fermo CMA  Clinical Pharmacist Assistant [phone]

## 2022-11-09 ENCOUNTER — Ambulatory Visit (INDEPENDENT_AMBULATORY_CARE_PROVIDER_SITE_OTHER): Payer: HMO | Admitting: Psychiatry

## 2022-11-09 DIAGNOSIS — F3162 Bipolar disorder, current episode mixed, moderate: Secondary | ICD-10-CM | POA: Diagnosis not present

## 2022-11-09 NOTE — Progress Notes (Signed)
[facility] Counselor/Therapist Progress Note  Patient ID: ASHLEYN AGNE, MRN: [id number],    Date: 11/09/2022  Time Spent: 55 minutes   Treatment Type: Individual Therapy  Reported Symptoms: anxiety, "hard to say No to others", my "fight or flight" tendencies are some better  Mental Status Exam:  Appearance:   Neat     Behavior:  Appropriate, Sharing, and Motivated  Motor:  Normal  Speech/Language:   Clear and Coherent  Affect:  anxious  Mood:  anxious  Thought process:  goal directed  Thought content:    Rumination  Sensory/Perceptual disturbances:    WNL  Orientation:  oriented to person, place, time/date, situation, day of week, month of year, year, and stated date of Nov 09, 2022  Attention:  Fair  Concentration:  Good and Fair  Memory:  WNL  Fund of knowledge:   Good  Insight:    Good and Fair  Judgment:   Good  Impulse Control:  Good   Risk Assessment: Danger to Self:  No Self-injurious Behavior: No Danger to Others: No Duty to Warn:no Physical Aggression / Violence:No  Access to Firearms a concern: No  Gang Involvement:No   Subjective:  Patient in session today and reports continued anxiety and depression which she states most is related to health, personal, family, and financial issues.  Recognizing more and more that she needs to be able to say "no" at times within the family, and finding this quite difficult, which she discussed more at length today as she acknowledges personal and family issues contribute heavily to her anxiety and depression. Obsessive at times, worries, easily stressed and difficulty staying on subject in conversation. Worked on this some in session today and able to be more calm with herself versus being as rigid. States "I watch way too much TV and I think it affects my memory and processed this more today." Plans to decrease some of her TV time and see if she notices a difference. Worked more today on her "being able to say No" to  others as needed as she has shared that is really challenging for her even though there are situations/people in her life where she need to say No and set healthier limits. Continues her attendance at Bible study. Shared with patient a new handout on "The Ability to Say No" including some exercises and journaling she can do between sessions that can  be of help.  Will follow up more at next session.  Good motivation today and some improved focus or easier in re-directing to help get back on subject. Working on "self love" and worrying less of what others think about her. Wants to believe in herself more as she continues working on reducing her anxiety, following through on therapy suggestions, having better boundaries with other, and experiencing more self-love and acceptance.   Interventions: Cognitive Behavioral Therapy and Ego-Supportive  1) Long Term Goal: Develop strategies to reduce symptoms.     Short Term Goal: Reduce anxiety and improve coping skills.     Objective: Learn two new ways of coping with daily stressors.     Objective: Develop strategies for thought distraction when fixating on the future.     Objective: Manage panic episodes.   2) Long Term Goal: Maintain stability of mood     Short Term Goal: Increase ability to manage moods.     Objective: Discuss and resolve troubling personal and interpersonal issues.     Diagnosis:   ICD-10-CM  1. Bipolar 1 disorder, mixed, moderate (HCC)  F31.62      Plan:   Patient participating actively in session today as she continued work on her anxiety, boundary issues, difficulty saying no to others, difficulty believing in herself, worrying, depression, and her need to set limits.  Has followed-through on homework assignments and actively participates in sessions.  Is putting forth good effort and making some initial progress.  Needs to continue working with her goal-directed behaviors to move in a forward direction. Encouraged patient in  using more positive and self affirming self talk/behaviors as noted in session today including: Refrain from identifying herself as a "failure" which she often does, refrain from other forms of self judgment, stay in contact with supportive people, stay involved in activities that she knows helps her maintain stability in her mood, refrain from self negating, continue working on her strategies and thought distraction, refrain from getting overly fixated on the future, be able to live more in the present, emphasize her positives, give herself a "timeout" as needed, acquired the ability to say no when she needs to say no without guilt, let her faith be a resource for her emotionally as well as spiritually, practice "the pause" when needed, believe more in herself, understand that her thoughts are "just thoughts" and are not necessarily going to play out in reality the way she might fear, pay attention to small steps of progress, and recognize the strength she shows working with goal-directed behaviors to move in a direction of improved emotional health and overall wellbeing.   Self rating scales: 1-10 depression scale-5/6 1-10 anxiety scale-7/8 1-10 hopefulness scale-6/7 (improved) 1-10 self-esteem scale-5  Goal review and progress/challenges noted with patient.  Next appointment within 2 to 3 weeks.  Mathis Fare, LCSW

## 2022-11-13 NOTE — Progress Notes (Signed)
CARDIOLOGY CONSULT NOTE       Patient ID: Brittany [name] MRN: [id number] DOB/AGE: Apr 10, 1956 67 y.o.  Primary Physician: Karie Georges, MD Primary Cardiologist: New/Dealie Koelzer last seen 2021    HPI:  67 y.o. referred back by Dr Britta Mccreedy for f/u dyspnea and CAD. Last seen by me in 2021 History of COPD, abnormal ECG RBBB/LAFB, PTSD, anxiety agoraphobia sees psych, chronic dyspnea and asthma. Normal myovue in 2016 with EF 74% Calcium score done 07/21/21 was 215 which is 89 th percentile for age/sex She is on lipitor for HLD. She had normal EF on TTE August 2021 Sees Dr Francine Graven pulmonary for her asthma Former smoker and had COVID in 2020 Uses Symbicort and albuterol as needed  Some fibrotic changes on CT 03/2020 but PFTls without restrictive/diffusion deficits 2022  LDL high and has not tolerated crestor /lipitor muscle pains suicidal ideaation. Red yeast rice not adequate LDL 187 10/23/22 Discussed need for Rx with family history and high calcium score for age. Refer to lipid clinic to consider PSK9    ROS All other systems reviewed and negative except as noted above  Past Medical History:  Diagnosis Date   Abnormal EKG    Allergy    Anxiety    Asthma    Bipolar depression (HCC), Anxiety, PTSD, Panic disorder    -managed by [facility]   Depression    Foot pain    GERD (gastroesophageal reflux disease)    Heart murmur    as a child   Insomnia    Leg swelling    Osteoporosis    osteopenia   Pneumonia due to COVID-19 virus 04/22/2019   Tachycardia     Family History  Problem Relation Age of Onset   Heart failure Mother    Hypertension Mother    Hyperlipidemia Mother    Congestive Heart Failure Mother    Other Father        killed   COPD Paternal Aunt        a lot of aunts and uncle COPD or Emphysema   Emphysema Paternal Uncle    Colon polyps Maternal Aunt    COPD Brother    Heart disease Brother    Pulmonary embolism Brother    Breast cancer Neg Hx    Colon  cancer Neg Hx    Esophageal cancer Neg Hx    Pancreatic cancer Neg Hx    Stomach cancer Neg Hx    Liver disease Neg Hx    Rectal cancer Neg Hx     Social History   Socioeconomic History   Marital status: Single    Spouse name: Not on file   Number of children: 3   Years of education: Not on file   Highest education level: Associate degree: occupational, Scientist, product/process development, or vocational program  Occupational History   Occupation: disabled  Tobacco Use   Smoking status: Former    Packs/day: 20.00    Years: 1.00    Additional pack years: 0.00    Total pack years: 20.00    Types: Cigarettes    Quit date: 06/19/1999    Years since quitting: 23.4   Smokeless tobacco: Never   Tobacco comments:    20 year estimate but probably less   Vaping Use   Vaping Use: Never used  Substance and Sexual Activity   Alcohol use: Yes    Alcohol/week: 0.0 standard drinks of alcohol    Comment: occ   Drug use: No   Sexual activity:  Never  Other Topics Concern   Not on file  Social History Narrative   Work or School: Disabled seconndary to psychiatric conditions      Home Situation: lives alone with 2 cats and one dog      Spiritual Beliefs: Christian      Lifestyle: no regular exercise, diet  Not great - wants to embark on healthier lifestyle   Social Determinants of Health   Financial Resource Strain: High Risk (10/22/2022)   Overall Financial Resource Strain (CARDIA)    Difficulty of Paying Living Expenses: Hard  Food Insecurity: Food Insecurity Present (10/22/2022)   Hunger Vital Sign    Worried About Running Out of Food in the Last Year: Often true    Ran Out of Food in the Last Year: Sometimes true  Transportation Needs: No Transportation Needs (10/22/2022)   PRAPARE - Administrator, Civil Service (Medical): No    Lack of Transportation (Non-Medical): No  Physical Activity: Sufficiently Active (10/22/2022)   Exercise Vital Sign    Days of Exercise per Week: 5 days    Minutes of  Exercise per Session: 30 min  Stress: Stress Concern Present (10/22/2022)   [employer] of Occupational Health - Occupational Stress Questionnaire    Feeling of Stress : Rather much  Social Connections: Moderately Integrated (10/22/2022)   Social Connection and Isolation Panel [NHANES]    Frequency of Communication with Friends and Family: Three times a week    Frequency of Social Gatherings with Friends and Family: Once a week    Attends Religious Services: More than 4 times per year    Active Member of [employer] or Organizations: Yes    Attends Engineer, structural: More than 4 times per year    Marital Status: Divorced  Intimate Partner Violence: Not At Risk (05/24/2022)   Humiliation, Afraid, Rape, and Kick questionnaire    Fear of Current or Ex-Partner: No    Emotionally Abused: No    Physically Abused: No    Sexually Abused: No    Past Surgical History:  Procedure Laterality Date   BREAST BIOPSY Right    biopsy of nipple   CARPAL TUNNEL RELEASE Left    CESAREAN SECTION     x 2   CHOLECYSTECTOMY     COLONOSCOPY     ENDOMETRIAL ABLATION     EXCISION MORTON'S NEUROMA Left 01/11/2022   Procedure: SECOND WEBSPACE MORTON'S NEUROMA EXCISION;  Surgeon: Toni Arthurs, MD;  Location: [facility];  Service: Orthopedics;  Laterality: Left;   FOOT SURGERY Left    HAMMER TOE SURGERY Left 01/11/2022   Procedure: COLLATERAL LIGAMENT REPAIRS WITH SECOND AND THIRD HAMMER TOE CORRECTION;  Surgeon: Toni Arthurs, MD;  Location: [facility];  Service: Orthopedics;  Laterality: Left;   KNEE SURGERY Right    reconstruction for patellar dislocation   LAPAROSCOPY     x 2   TOENAIL EXCISION Left 01/11/2022   Procedure: HALLUX TOENAIL PERMANENT EXCISION;  Surgeon: Toni Arthurs, MD;  Location: [facility];  Service: Orthopedics;  Laterality: Left;   TUBAL LIGATION     ULNAR NERVE REPAIR Left    UPPER GASTROINTESTINAL ENDOSCOPY     WEIL OSTEOTOMY Left  01/11/2022   Procedure: SECOND, THIRD AND FOURTH WEIL OSTEOTOMIES;  Surgeon: Toni Arthurs, MD;  Location: [facility];  Service: Orthopedics;  Laterality: Left;      Current Outpatient Medications:    albuterol (PROVENTIL) (2.5 MG/3ML) 0.083% nebulizer  solution, Take 3 mLs (2.5 mg total) by nebulization every 6 (six) hours as needed for wheezing or shortness of breath., Disp: 75 mL, Rfl: 12   Albuterol Sulfate (PROAIR RESPICLICK) 108 (90 Base) MCG/ACT AEPB, Inhale 2 puffs into the lungs every 6 (six) hours as needed., Disp: 1 each, Rfl: 6   Azelastine HCl 137 MCG/SPRAY SOLN, SPRAY 1 SPRAY IN EACH NOSTRIL TWICE DAILY AS NEEDED, Disp: 30 mL, Rfl: 1   azithromycin (ZITHROMAX) 250 MG tablet, Take as directed, Disp: 6 tablet, Rfl: 0   b complex vitamins capsule, Take 1 capsule by mouth daily., Disp: , Rfl:    busPIRone (BUSPAR) 15 MG tablet, Take 1 tablet (15 mg total) by mouth 3 (three) times daily., Disp: 300 tablet, Rfl: 1   cetirizine (ZYRTEC) 10 MG tablet, Take 1 tablet (10 mg total) by mouth daily., Disp: 90 tablet, Rfl: 1   Cholecalciferol (VITAMIN D-3 PO), Take 10,000 Units by mouth daily. , Disp: , Rfl:    COLLAGEN PO, Take by mouth., Disp: , Rfl:    diazepam (VALIUM) 5 MG tablet, Take 1 tablet (5 mg total) by mouth every 6 (six) hours as needed. for anxiety, Disp: 120 tablet, Rfl: 5   dicyclomine (BENTYL) 20 MG tablet, TAKE ONE TABLET BY MOUTH FOUR TIMES A DAY AS NEEDED FOR SPASMS, Disp: 360 tablet, Rfl: 3   doxazosin (CARDURA) 4 MG tablet, Take 2 tablets (8 mg total) by mouth at bedtime., Disp: 200 tablet, Rfl: 1   famotidine (PEPCID) 20 MG tablet, Take 1 tablet (20 mg total) by mouth 2 (two) times daily. (Patient taking differently: Take 20 mg by mouth as needed.), Disp: 180 tablet, Rfl: 3   ferrous sulfate 325 (65 FE) MG EC tablet, Take 325 mg by mouth 2 (two) times a week., Disp: , Rfl:    fluticasone (FLONASE) 50 MCG/ACT nasal spray, Place 2 sprays into both nostrils in  the morning and at bedtime., Disp: 96 mL, Rfl: 2   fluticasone-salmeterol (ADVAIR) 250-50 MCG/ACT AEPB, Inhale 1 puff into the lungs every 12 (twelve) hours., Disp: 60 each, Rfl: 3   MAGNESIUM PO, Calcium citrate, magnesium and vitamin D3 combo, Disp: , Rfl:    mirtazapine (REMERON) 45 MG tablet, Take 1 tablet (45 mg total) by mouth at bedtime., Disp: 100 tablet, Rfl: 1   montelukast (SINGULAIR) 10 MG tablet, Take 1 tablet (10 mg total) by mouth at bedtime., Disp: 100 tablet, Rfl: 2   Multiple Vitamin (MULTIVITAMIN WITH MINERALS) TABS tablet, Take 1 tablet by mouth daily., Disp: , Rfl:    pantoprazole (PROTONIX) 40 MG tablet, Take 1 tablet (40 mg total) by mouth daily., Disp: 90 tablet, Rfl: 3   pseudoephedrine-guaifenesin (MUCINEX D) 60-600 MG 12 hr tablet, Take 1 tablet by mouth every 12 (twelve) hours., Disp: 15 tablet, Rfl: 0   Red Yeast Rice 600 MG CAPS, Take 1 capsule by mouth daily., Disp: , Rfl:    sodium chloride (OCEAN) 0.65 % SOLN nasal spray, Place 1 spray into both nostrils as needed for congestion., Disp: , Rfl:   Current Facility-Administered Medications:    0.9 %  sodium chloride infusion, 500 mL, Intravenous, Once, Tressia Danas, MD   0.9 %  sodium chloride infusion, 500 mL, Intravenous, Once, Tressia Danas, MD   sodium chloride     sodium chloride      Physical Exam: Blood pressure 120/82, pulse 68, height 5\' 1"  (1.549 m), weight 164 lb (74.4 kg), SpO2 96 %.    Affect  appropriate Obese HEENT: normal Neck supple with no adenopathy JVP normal no bruits no thyromegaly Lungs clear with no wheezing and good diaphragmatic motion Heart:  S1/S2 no murmur, no rub, gallop or click PMI normal Abdomen: benighn, BS positve, no tenderness, no AAA no bruit.  No HSM or HJR Distal pulses intact with no bruits No edema Neuro non-focal Skin warm and dry No muscular weakness   Labs:   Lab Results  Component Value Date   WBC 4.4 05/10/2021   HGB 15.5 (H) 05/10/2021    HCT 45.9 05/10/2021   MCV 91.1 05/10/2021   PLT 318.0 05/10/2021   No results for input(s): "NA", "K", "CL", "CO2", "BUN", "CREATININE", "CALCIUM", "PROT", "BILITOT", "ALKPHOS", "ALT", "AST", "GLUCOSE" in the last 168 hours.  Invalid input(s): "LABALBU" No results found for: "CKTOTAL", "CKMB", "CKMBINDEX", "TROPONINI"  Lab Results  Component Value Date   CHOL 259 (H) 10/23/2022   CHOL 255 (H) 04/23/2022   CHOL 166 11/17/2021   Lab Results  Component Value Date   HDL 57.60 10/23/2022   HDL 71.90 04/23/2022   HDL 62 11/17/2021   Lab Results  Component Value Date   LDLCALC 187 (H) 10/23/2022   LDLCALC 167 (H) 04/23/2022   LDLCALC 90 11/17/2021   Lab Results  Component Value Date   TRIG 74.0 10/23/2022   TRIG 81.0 04/23/2022   TRIG 76 11/17/2021   Lab Results  Component Value Date   CHOLHDL 5 10/23/2022   CHOLHDL 4 04/23/2022   CHOLHDL 2.7 11/17/2021   No results found for: "LDLDIRECT"    Radiology: No results found.  EKG: 11/22/2022  SR rate 68  RBBB/LAD   ASSESSMENT AND PLAN:   CAD:  subclinical calcium noted on CT Elevated score for age continue lifestyle changes and statin No chest pain or indication for stress testing Abnormal ECG:  RBBB/LAD chronic ? Related to lung dx Yearly ECG Dyspnea related to prior smoking, asthma obesity and prior COVID F/U Dr Francine Graven pulmonary Continue inhalers No active wheezing on exam EF normal on myovue 2016 and TTE August 2021 PTSD: with anxiety/depression f/u psych on Buspar and valium HLD:  ? Suicidal on lipitor and crestor LDL 187 labs 10/23/22 High calcium score for age and family history of premature CAD Refer to lipid clinic for PSK9   Lipid clinic   F/U cardiology in a year    Signed: Charlton Haws 11/22/2022, 10:09 AM

## 2022-11-15 ENCOUNTER — Encounter: Payer: Self-pay | Admitting: Pulmonary Disease

## 2022-11-15 ENCOUNTER — Ambulatory Visit (INDEPENDENT_AMBULATORY_CARE_PROVIDER_SITE_OTHER): Payer: HMO | Admitting: Pulmonary Disease

## 2022-11-15 VITALS — BP 122/68 | HR 74 | Ht 61.0 in | Wt 167.6 lb

## 2022-11-15 DIAGNOSIS — J309 Allergic rhinitis, unspecified: Secondary | ICD-10-CM | POA: Diagnosis not present

## 2022-11-15 DIAGNOSIS — J4540 Moderate persistent asthma, uncomplicated: Secondary | ICD-10-CM

## 2022-11-15 DIAGNOSIS — J0101 Acute recurrent maxillary sinusitis: Secondary | ICD-10-CM

## 2022-11-15 MED ORDER — ALBUTEROL SULFATE (2.5 MG/3ML) 0.083% IN NEBU: 2.5000 mg | INHALATION_SOLUTION | Freq: Four times a day (QID) | RESPIRATORY_TRACT | 12 refills | Status: AC | PRN

## 2022-11-15 MED ORDER — MONTELUKAST SODIUM 10 MG PO TABS: 10.0000 mg | ORAL_TABLET | Freq: Every day | ORAL | 2 refills | Status: DC

## 2022-11-15 MED ORDER — AZITHROMYCIN 250 MG PO TABS: ORAL_TABLET | ORAL | 0 refills | Status: DC

## 2022-11-15 NOTE — Patient Instructions (Addendum)
Continue advair diksus 250-48mcg 1 puff twice daily - rinse mouth out after each use  Use albuterol inhaler 1-2 puffs every 4-6 hours as needed  Continue nasal sprays  Start Zpak for concern of sinusitis  Follow up in 1 year

## 2022-11-15 NOTE — Addendum Note (Signed)
Addended by: Maurene Capes on: 11/15/2022 09:44 AM   Modules accepted: Orders

## 2022-11-15 NOTE — Progress Notes (Signed)
Synopsis: Referred in 2018 for asthma by Karie Georges, MD.  Previously patient of Dr. Sherene Sires and Dr. Shelle Iron.  Subjective:   PATIENT ID: Brittany [name] GENDER: female DOB: 06-18-1956, MRN: [id number]  Chief Complaint  Patient presents with   Follow-up    6 mo f/u for asthma. States she believes she has a sinus infection starting. Increased facial pressure around her eyes and a headache. Denies any nasal congestion, chest congestion or fever.    HPI Brooke Zandi is a 67 year old woman, former smoker with covid 19 pneumonia in 2020 and asthma who returns to pulmonary clinic for follow up.   Her breathing has been doing ok, she reports increased nebulizer use this spring but no need for prednisone taper.   She complains of 3-4 days of right sided pressure headache and facial pressure concerning for sinus infection  OV 05/23/22 She was treated for asthma exacerbation in 04/06/22 with azithromycin and depomedrol injection.  She has been doing ok since her exacerbation.  [employer] is not covering symbicort this coming year but still qualifies for patient assistance.   OV 11/15/21 She has done well since last visit. She is using symbicort 160-4.77mcg 2 puffs twice daily and as needed albuterol.   No issues with allergies currently. She remains on zyrtec, singulair, flonase and astelin.   Constant post nasal drainage and congestion in her throat  Lab Results  Component Value Date   NITRICOXIDE 14 07/16/2016    Past Medical History:  Diagnosis Date   Abnormal EKG    Allergy    Anxiety    Asthma    Bipolar depression (HCC), Anxiety, PTSD, Panic disorder    -managed by [facility]   Depression    Foot pain    GERD (gastroesophageal reflux disease)    Heart murmur    as a child   Insomnia    Leg swelling    Osteoporosis    osteopenia   Pneumonia due to COVID-19 virus 04/22/2019   Tachycardia      Family History  Problem Relation Age of Onset   Heart  failure Mother    Hypertension Mother    Hyperlipidemia Mother    Congestive Heart Failure Mother    Other Father        killed   COPD Paternal Aunt        a lot of aunts and uncle COPD or Emphysema   Emphysema Paternal Uncle    Colon polyps Maternal Aunt    COPD Brother    Heart disease Brother    Pulmonary embolism Brother    Breast cancer Neg Hx    Colon cancer Neg Hx    Esophageal cancer Neg Hx    Pancreatic cancer Neg Hx    Stomach cancer Neg Hx    Liver disease Neg Hx    Rectal cancer Neg Hx      Past Surgical History:  Procedure Laterality Date   BREAST BIOPSY Right    biopsy of nipple   CARPAL TUNNEL RELEASE Left    CESAREAN SECTION     x 2   CHOLECYSTECTOMY     COLONOSCOPY     ENDOMETRIAL ABLATION     EXCISION MORTON'S NEUROMA Left 01/11/2022   Procedure: SECOND WEBSPACE MORTON'S NEUROMA EXCISION;  Surgeon: Toni Arthurs, MD;  Location: [facility];  Service: Orthopedics;  Laterality: Left;   FOOT SURGERY Left    HAMMER TOE SURGERY Left 01/11/2022   Procedure: COLLATERAL  LIGAMENT REPAIRS WITH SECOND AND THIRD HAMMER TOE CORRECTION;  Surgeon: Toni Arthurs, MD;  Location: [facility];  Service: Orthopedics;  Laterality: Left;   KNEE SURGERY Right    reconstruction for patellar dislocation   LAPAROSCOPY     x 2   TOENAIL EXCISION Left 01/11/2022   Procedure: HALLUX TOENAIL PERMANENT EXCISION;  Surgeon: Toni Arthurs, MD;  Location: [facility];  Service: Orthopedics;  Laterality: Left;   TUBAL LIGATION     ULNAR NERVE REPAIR Left    UPPER GASTROINTESTINAL ENDOSCOPY     WEIL OSTEOTOMY Left 01/11/2022   Procedure: SECOND, THIRD AND FOURTH WEIL OSTEOTOMIES;  Surgeon: Toni Arthurs, MD;  Location: [facility];  Service: Orthopedics;  Laterality: Left;    Social History   Socioeconomic History   Marital status: Single    Spouse name: Not on file   Number of children: 3   Years of education: Not on file    Highest education level: Associate degree: occupational, Scientist, product/process development, or vocational program  Occupational History   Occupation: disabled  Tobacco Use   Smoking status: Former    Packs/day: 20.00    Years: 1.00    Additional pack years: 0.00    Total pack years: 20.00    Types: Cigarettes    Quit date: 06/19/1999    Years since quitting: 23.4   Smokeless tobacco: Never   Tobacco comments:    20 year estimate but probably less   Vaping Use   Vaping Use: Never used  Substance and Sexual Activity   Alcohol use: Yes    Alcohol/week: 0.0 standard drinks of alcohol    Comment: occ   Drug use: No   Sexual activity: Never  Other Topics Concern   Not on file  Social History Narrative   Work or School: Disabled seconndary to psychiatric conditions      Home Situation: lives alone with 2 cats and one dog      Spiritual Beliefs: Christian      Lifestyle: no regular exercise, diet  Not great - wants to embark on healthier lifestyle   Social Determinants of Health   Financial Resource Strain: High Risk (10/22/2022)   Overall Financial Resource Strain (CARDIA)    Difficulty of Paying Living Expenses: Hard  Food Insecurity: Food Insecurity Present (10/22/2022)   Hunger Vital Sign    Worried About Running Out of Food in the Last Year: Often true    Ran Out of Food in the Last Year: Sometimes true  Transportation Needs: No Transportation Needs (10/22/2022)   PRAPARE - Administrator, Civil Service (Medical): No    Lack of Transportation (Non-Medical): No  Physical Activity: Sufficiently Active (10/22/2022)   Exercise Vital Sign    Days of Exercise per Week: 5 days    Minutes of Exercise per Session: 30 min  Stress: Stress Concern Present (10/22/2022)   [employer] of Occupational Health - Occupational Stress Questionnaire    Feeling of Stress : Rather much  Social Connections: Moderately Integrated (10/22/2022)   Social Connection and Isolation Panel [NHANES]    Frequency of  Communication with Friends and Family: Three times a week    Frequency of Social Gatherings with Friends and Family: Once a week    Attends Religious Services: More than 4 times per year    Active Member of [employer] or Organizations: Yes    Attends Banker Meetings: More than 4 times per year  Marital Status: Divorced  Catering manager Violence: Not At Risk (05/24/2022)   Humiliation, Afraid, Rape, and Kick questionnaire    Fear of Current or Ex-Partner: No    Emotionally Abused: No    Physically Abused: No    Sexually Abused: No     Allergies  Allergen Reactions   Aspirin Other (See Comments)    discomfort   Atorvastatin     Patient states she felt suicidal   Codeine Itching   Crestor [Rosuvastatin]     Dizzy, leg pain and states she felt suicidal   Gabapentin    Niacin And Related Itching   Sulfa Antibiotics     Other reaction(s): Unknown   Sulfamethoxazole-Trimethoprim Other (See Comments)    Unknown reaction per pt   Topiramate Other (See Comments)    Per patient, it causes stomach burning   Trileptal [Oxcarbazepine]     itching   Trimethoprim Other (See Comments)    Unknown, per pt   Ultram [Tramadol] Itching   Vioxx [Rofecoxib]     Unknown per pt     Immunization History  Administered Date(s) Administered   Fluad Quad(high Dose 65+) 02/28/2021   Influenza Split 04/26/2011   Influenza Whole 03/18/2009, 03/18/2010, 03/18/2016   Influenza,inj,Quad PF,6+ Mos 04/19/2014, 02/28/2017, 03/06/2018, 02/13/2019, 03/04/2020   Influenza-Unspecified 02/22/2022   PFIZER(Purple Top)SARS-COV-2 Vaccination 09/04/2019, 09/29/2019, 04/06/2020, 04/09/2021   PNEUMOCOCCAL CONJUGATE-20 05/10/2021   Pfizer Covid Bivalent Pediatric Vaccine(42mos to <43yrs) 03/17/2022   Pneumococcal Conjugate-13 03/10/2015   Pneumococcal Polysaccharide-23 06/18/2005   Respiratory Syncytial Virus Vaccine,Recomb Aduvanted(Arexvy) 03/08/2022   Tdap 12/04/2016   Zoster Recombinat (Shingrix)  01/01/2018, 06/22/2018    Outpatient Medications Prior to Visit  Medication Sig Dispense Refill   albuterol (PROVENTIL) (2.5 MG/3ML) 0.083% nebulizer solution Take 3 mLs (2.5 mg total) by nebulization every 6 (six) hours as needed for wheezing or shortness of breath. 75 mL 12   Albuterol Sulfate (PROAIR RESPICLICK) 108 (90 Base) MCG/ACT AEPB Inhale 2 puffs into the lungs every 6 (six) hours as needed. 1 each 6   Azelastine HCl 137 MCG/SPRAY SOLN SPRAY 1 SPRAY IN EACH NOSTRIL TWICE DAILY AS NEEDED 30 mL 1   b complex vitamins capsule Take 1 capsule by mouth daily.     busPIRone (BUSPAR) 15 MG tablet Take 1 tablet (15 mg total) by mouth 3 (three) times daily. 300 tablet 1   cetirizine (ZYRTEC) 10 MG tablet Take 1 tablet (10 mg total) by mouth daily. 90 tablet 1   Cholecalciferol (VITAMIN D-3 PO) Take 10,000 Units by mouth daily.      COLLAGEN PO Take by mouth.     diazepam (VALIUM) 5 MG tablet Take 1 tablet (5 mg total) by mouth every 6 (six) hours as needed. for anxiety 120 tablet 5   dicyclomine (BENTYL) 20 MG tablet TAKE ONE TABLET BY MOUTH FOUR TIMES A DAY AS NEEDED FOR SPASMS 360 tablet 3   doxazosin (CARDURA) 4 MG tablet Take 2 tablets (8 mg total) by mouth at bedtime. 200 tablet 1   famotidine (PEPCID) 20 MG tablet Take 1 tablet (20 mg total) by mouth 2 (two) times daily. (Patient taking differently: Take 20 mg by mouth as needed.) 180 tablet 3   ferrous sulfate 325 (65 FE) MG EC tablet Take 325 mg by mouth 2 (two) times a week.     fluticasone (FLONASE) 50 MCG/ACT nasal spray Place 2 sprays into both nostrils in the morning and at bedtime. 96 mL 2   fluticasone-salmeterol (ADVAIR) 250-50  MCG/ACT AEPB Inhale 1 puff into the lungs every 12 (twelve) hours. 60 each 3   MAGNESIUM PO Calcium citrate, magnesium and vitamin D3 combo     mirtazapine (REMERON) 45 MG tablet Take 1 tablet (45 mg total) by mouth at bedtime. 100 tablet 1   montelukast (SINGULAIR) 10 MG tablet TAKE ONE TABLET BY MOUTH  EVERY NIGHT AT BEDTIME 90 tablet 2   Multiple Vitamin (MULTIVITAMIN WITH MINERALS) TABS tablet Take 1 tablet by mouth daily.     pantoprazole (PROTONIX) 40 MG tablet Take 1 tablet (40 mg total) by mouth daily. 90 tablet 3   pseudoephedrine-guaifenesin (MUCINEX D) 60-600 MG 12 hr tablet Take 1 tablet by mouth every 12 (twelve) hours. 15 tablet 0   Red Yeast Rice 600 MG CAPS Take 1 capsule by mouth daily.     sodium chloride (OCEAN) 0.65 % SOLN nasal spray Place 1 spray into both nostrils as needed for congestion.     Facility-Administered Medications Prior to Visit  Medication Dose Route Frequency Provider Last Rate Last Admin   0.9 %  sodium chloride infusion  500 mL Intravenous Once Tressia Danas, MD       0.9 %  sodium chloride infusion  500 mL Intravenous Once Tressia Danas, MD        Review of Systems  Constitutional:  Negative for chills, diaphoresis, fever, malaise/fatigue and weight loss.  HENT:  Positive for congestion and sinus pain. Negative for nosebleeds.   Eyes: Negative.   Respiratory:  Positive for cough. Negative for hemoptysis, sputum production, shortness of breath and wheezing.   Cardiovascular:  Negative for chest pain, palpitations, orthopnea, claudication and leg swelling.  Gastrointestinal:  Negative for heartburn and nausea.  Genitourinary: Negative.   Musculoskeletal:  Negative for joint pain and myalgias.  Neurological:  Negative for dizziness, weakness and headaches.  Psychiatric/Behavioral: Negative.      Objective:   Vitals:   11/15/22 0920  BP: 122/68  Pulse: 74  SpO2: 97%  Weight: 167 lb 9.6 oz (76 kg)  Height: 5\' 1"  (1.549 m)   97% on  RA BMI Readings from Last 3 Encounters:  11/15/22 31.67 kg/m  10/23/22 32.10 kg/m  05/24/22 34.01 kg/m   Wt Readings from Last 3 Encounters:  11/15/22 167 lb 9.6 oz (76 kg)  10/23/22 169 lb 14.4 oz (77.1 kg)  05/24/22 180 lb (81.6 kg)    Physical Exam Vitals reviewed.  Constitutional:       General: She is not in acute distress.    Appearance: She is obese. She is not ill-appearing.  HENT:     Head: Normocephalic and atraumatic.     Nose: Congestion present.     Right Sinus: Maxillary sinus tenderness present.  Eyes:     General: No scleral icterus. Cardiovascular:     Rate and Rhythm: Normal rate and regular rhythm.     Heart sounds: No murmur heard. Pulmonary:     Effort: Pulmonary effort is normal.     Breath sounds: Normal breath sounds. No wheezing, rhonchi or rales.  Musculoskeletal:     Cervical back: Neck supple.     Right lower leg: No edema.     Left lower leg: No edema.  Skin:    General: Skin is warm and dry.     Findings: No rash.  Neurological:     General: No focal deficit present.     Mental Status: She is alert.     Coordination: Coordination normal.  CBC    Component Value Date/Time   WBC 4.4 05/10/2021 0934   RBC 5.04 05/10/2021 0934   HGB 15.5 (H) 05/10/2021 0934   HCT 45.9 05/10/2021 0934   PLT 318.0 05/10/2021 0934   MCV 91.1 05/10/2021 0934   MCH 31.7 04/06/2020 0916   MCHC 33.8 05/10/2021 0934   RDW 14.6 05/10/2021 0934   LYMPHSABS 0.8 05/10/2021 0934   MONOABS 0.3 05/10/2021 0934   EOSABS 0.1 05/10/2021 0934   BASOSABS 0.0 05/10/2021 0934   Chest Imaging- films reviewed: HRCT Chest 03/2020  Moderate patchy ground glass opacity, reticulation and septal thickening throughout both lungs with associated mild traction bronchiectasis and arcitectural distortion, with an upper lobe predominance, asymmetrically prominent on the left. No frank honeycombing. Previously noted acute opacities on 04/22/2019 chest CTA have improved/resolved while the fibrotic components are new.   CXR, 2 view 05/12/2019-left greater than right opacities and reticulation.  Possible posterior pleural effusion silhouetting hemidiaphragm  CTA chest 04/22/2019-left greater than right peripheral groundglass opacities.  No PE.  CXR 02/11/2020-increased interstitial  markings throughout  Echocardiogram 01/26/20: LVEF 60 to 65%, no regional wall motion abnormalities.  Mild concentric LVH, moderate basal septal hypertrophy.  Normal LA.  RV with mildly reduced function, normal size.  Normal RA.  Normal valves.  Pulmonary Functions Testing Results:    Latest Ref Rng & Units 10/25/2020    9:43 AM 09/03/2019    8:41 AM  PFT Results  FVC-Pre L 2.74  2.55   FVC-Predicted Pre % 93  86   FVC-Post L 2.90  2.71   FVC-Predicted Post % 99  91   Pre FEV1/FVC % % 70  70   Post FEV1/FCV % % 69  67   FEV1-Pre L 1.92  1.78   FEV1-Predicted Pre % 85  78   FEV1-Post L 2.00  1.83   DLCO uncorrected ml/min/mmHg 19.95  16.97   DLCO UNC% % 107  91   DLCO corrected ml/min/mmHg 19.95  16.97   DLCO COR %Predicted % 107  91   DLVA Predicted % 105  98   TLC L 5.00  4.48   TLC % Predicted % 105  94   RV % Predicted % 105  99    2021- mild obstruction, no bronchodilator reversibility.  No restriction, hyperinflation, or air trapping.  Normal diffusion.  2022 - mild obstruction present.   Assessment & Plan:     ICD-10-CM   1. Moderate persistent asthma without complication  J45.40     2. Acute recurrent maxillary sinusitis  J01.01 azithromycin (ZITHROMAX) 250 MG tablet    3. 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  - PFTs without restrictive or diffsuion defects in 08/2019 and remain stable in 2022  Acute Maxillary Sinusitis Allergic rhinosinusitis -Continue Zyrtec - flonase daily  - continue azelastine nasal spray daily - try saline nasal rinses daily - take Zpak for 5 days  Follow up in 1 year  Melody Comas, MD [facility] Office: [phone]     Current Outpatient Medications:    albuterol (PROVENTIL) (2.5 MG/3ML) 0.083% nebulizer  solution, Take 3 mLs (2.5 mg total) by nebulization every 6 (six) hours as needed for wheezing or shortness of breath., Disp: 75 mL, Rfl: 12   Albuterol Sulfate (PROAIR  RESPICLICK) 108 (90 Base) MCG/ACT AEPB, Inhale 2 puffs into the lungs every 6 (six) hours as needed., Disp: 1 each, Rfl: 6   Azelastine HCl 137 MCG/SPRAY SOLN, SPRAY 1 SPRAY IN EACH NOSTRIL TWICE DAILY AS NEEDED, Disp: 30 mL, Rfl: 1   azithromycin (ZITHROMAX) 250 MG tablet, Take as directed, Disp: 6 tablet, Rfl: 0   b complex vitamins capsule, Take 1 capsule by mouth daily., Disp: , Rfl:    busPIRone (BUSPAR) 15 MG tablet, Take 1 tablet (15 mg total) by mouth 3 (three) times daily., Disp: 300 tablet, Rfl: 1   cetirizine (ZYRTEC) 10 MG tablet, Take 1 tablet (10 mg total) by mouth daily., Disp: 90 tablet, Rfl: 1   Cholecalciferol (VITAMIN D-3 PO), Take 10,000 Units by mouth daily. , Disp: , Rfl:    COLLAGEN PO, Take by mouth., Disp: , Rfl:    diazepam (VALIUM) 5 MG tablet, Take 1 tablet (5 mg total) by mouth every 6 (six) hours as needed. for anxiety, Disp: 120 tablet, Rfl: 5   dicyclomine (BENTYL) 20 MG tablet, TAKE ONE TABLET BY MOUTH FOUR TIMES A DAY AS NEEDED FOR SPASMS, Disp: 360 tablet, Rfl: 3   doxazosin (CARDURA) 4 MG tablet, Take 2 tablets (8 mg total) by mouth at bedtime., Disp: 200 tablet, Rfl: 1   famotidine (PEPCID) 20 MG tablet, Take 1 tablet (20 mg total) by mouth 2 (two) times daily. (Patient taking differently: Take 20 mg by mouth as needed.), Disp: 180 tablet, Rfl: 3   ferrous sulfate 325 (65 FE) MG EC tablet, Take 325 mg by mouth 2 (two) times a week., Disp: , Rfl:    fluticasone (FLONASE) 50 MCG/ACT nasal spray, Place 2 sprays into both nostrils in the morning and at bedtime., Disp: 96 mL, Rfl: 2   fluticasone-salmeterol (ADVAIR) 250-50 MCG/ACT AEPB, Inhale 1 puff into the lungs every 12 (twelve) hours., Disp: 60 each, Rfl: 3   MAGNESIUM PO, Calcium citrate, magnesium and vitamin D3 combo, Disp: , Rfl:     mirtazapine (REMERON) 45 MG tablet, Take 1 tablet (45 mg total) by mouth at bedtime., Disp: 100 tablet, Rfl: 1   montelukast (SINGULAIR) 10 MG tablet, TAKE ONE TABLET BY MOUTH EVERY NIGHT AT BEDTIME, Disp: 90 tablet, Rfl: 2   Multiple Vitamin (MULTIVITAMIN WITH MINERALS) TABS tablet, Take 1 tablet by mouth daily., Disp: , Rfl:    pantoprazole (PROTONIX) 40 MG tablet, Take 1 tablet (40 mg total) by mouth daily., Disp: 90 tablet, Rfl: 3   pseudoephedrine-guaifenesin (MUCINEX D) 60-600 MG 12 hr tablet, Take 1 tablet by mouth every 12 (twelve) hours., Disp: 15 tablet, Rfl: 0   Red Yeast Rice 600 MG CAPS, Take 1 capsule by mouth daily., Disp: , Rfl:    sodium chloride (OCEAN) 0.65 % SOLN nasal spray, Place 1 spray into both nostrils as needed for congestion., Disp: , Rfl:   Current Facility-Administered Medications:    0.9 %  sodium chloride infusion, 500 mL, Intravenous, Once, Tressia Danas, MD   0.9 %  sodium chloride infusion, 500 mL, Intravenous, Once, Tressia Danas, MD

## 2022-11-22 ENCOUNTER — Encounter: Payer: Self-pay | Admitting: Cardiovascular Disease

## 2022-11-22 ENCOUNTER — Ambulatory Visit: Payer: HMO | Attending: Cardiovascular Disease | Admitting: Cardiovascular Disease

## 2022-11-22 VITALS — BP 120/82 | HR 68 | Ht 61.0 in | Wt 164.0 lb

## 2022-11-22 DIAGNOSIS — I2510 Atherosclerotic heart disease of native coronary artery without angina pectoris: Secondary | ICD-10-CM

## 2022-11-22 DIAGNOSIS — E785 Hyperlipidemia, unspecified: Secondary | ICD-10-CM

## 2022-11-22 NOTE — Patient Instructions (Addendum)
Medication Instructions:  Your physician recommends that you continue on your current medications as directed. Please refer to the Current Medication list given to you today.  *If you need a refill on your cardiac medications before your next appointment, please call your pharmacy*  Lab Work: If you have labs (blood work) drawn today and your tests are completely normal, you will receive your results only by: MyChart Message (if you have MyChart) OR A paper copy in the mail If you have any lab test that is abnormal or we need to change your treatment, we will call you to review the results.  Testing/Procedures: None ordered today.  Follow-Up: At [facility], you and your health needs are our priority.  As part of our continuing mission to provide you with exceptional heart care, we have created designated Provider Care Teams.  These Care Teams include your primary Cardiologist (physician) and Advanced Practice Providers (APPs -  Physician Assistants and Nurse Practitioners) who all work together to provide you with the care you need, when you need it.  We recommend signing up for the patient portal called "MyChart".  Sign up information is provided on this After Visit Summary.  MyChart is used to connect with patients for Virtual Visits (Telemedicine).  Patients are able to view lab/test results, encounter notes, upcoming appointments, etc.  Non-urgent messages can be sent to your provider as well.   To learn more about what you can do with MyChart, go to [email or web address].    Your next appointment:   1 year(s)  Provider:   Charlton Haws, MD     Other Instructions You have been referred to [facility].

## 2022-11-23 ENCOUNTER — Ambulatory Visit (INDEPENDENT_AMBULATORY_CARE_PROVIDER_SITE_OTHER): Payer: HMO | Admitting: Psychiatry

## 2022-11-23 DIAGNOSIS — F3162 Bipolar disorder, current episode mixed, moderate: Secondary | ICD-10-CM

## 2022-11-23 NOTE — Progress Notes (Signed)
[facility] Counselor/Therapist Progress Note  Patient ID: Brittany [name], MRN: [id number],    Date: 11/23/2022  Time Spent: 55 minutes   Treatment Type: Individual Therapy  Reported Symptoms:  anxiety, some depression  Mental Status Exam:  Appearance:   Casual and Neat     Behavior:  Appropriate, Sharing, and Motivated  Motor:  Normal  Speech/Language:   Clear and Coherent  Affect:  Anxious, some depression  Mood:  anxious and depressed  Thought process:  goal directed  Thought content:    Rumination  Sensory/Perceptual disturbances:    WNL  Orientation:  oriented to person, place, time/date, situation, day of week, month of year, year, and stated date of November 23, 2022  Attention:  Good  Concentration:  Good  Memory:  WNL  Fund of knowledge:   Good  Insight:    Good and Fair  Judgment:   Good  Impulse Control:  Good   Risk Assessment: Danger to Self:  No Self-injurious Behavior: No Danger to Others: No Duty to Warn:no Physical Aggression / Violence:No  Access to Firearms a concern: No  Gang Involvement:No   Subjective:  Patient in session today, very actively working on treatment goals. Reporting anxiety and depression related to patient's health, family, financial, and personal issues. Financial stressor currently "are weighing on me" and processed more details of this in session today, including how she is working to get in better financial shape. Prioritizing expenses and making payment to get her credit balance down. Cardiac issues and saw her Dr yesterday who talked with her about concerns with "my calcium and how it affects my heart". Reviewed objectives from last session and reports she is making efforts in her setting healthier boundaries with some success, has decreased her TV time, and has started back working on her "word and number puzzles" which she feels is helpful for her brain health. "I do bounce of the walls at timed but I have gotten better sleep  lately and energy is better and I have changed to eating healthier and feel that helps my energy too." Working on saying "no" more often as she needs to do this, admits she needs to decrease TV time, trying to decrease her obsessiveness and overthinking. Has a lot on her "wanting to fix" list, and am encourage her to focus on a few things at a time versus all at once. Better focus in some ways today. Motivated.  Still struggling with some obsessiveness, worrying/overthinking, and difficulty staying on topic in conversation however she is very committed and working on these challenges and there is evidence of some growth as she works on issues and therapy.  She seems to be feeling a little more hopeful about being able to make changes that would benefit her and her health.  Discussed the handout I gave her last session on "the ability to say no" and she is to follow-up on this more in between sessions.  Continues to work on self love, and not do well so much on what "others may think about me".  Really wanting to believe in herself more and is working with goal-directed behaviors daily.   Interventions: Cognitive Behavioral Therapy and Ego-Supportive  1) Long Term Goal: Develop strategies to reduce symptoms.     Short Term Goal: Reduce anxiety and improve coping skills.     Objective: Learn two new ways of coping with daily stressors.     Objective: Develop strategies for thought distraction when fixating on  the future.     Objective: Manage panic episodes.  2) Long Term Goal: Maintain stability of mood     Short Term Goal: Increase ability to manage moods.     Objective: Discuss and resolve troubling personal and interpersonal issues.   Diagnosis:   ICD-10-CM   1. Bipolar 1 disorder, mixed, moderate (HCC)  F31.62      Plan:  Patient today participating well in session as she continued to work on her boundary issues, difficulty saying no and setting appropriate limits with others, her anxiety,  difficulty believing in herself, worrying, and depression.  Follows through well on homework assignments between sessions and showing some good strength.  She seems to also recognize some initial progress.  Needs to continue her work with goal-directed behaviors so as to move and a forward direction. Encouraged patient to use more positive and self affirming self talk/behaviors as noted in session today including: Refrain from identifying herself as a "failure" which she often does, refrain from other forms of self judgment, staying in contact with supportive people, remain involved in activities she knows helps her maintain stability in her mood, refrain from self negating, continue working on her strategies and thought distraction, refrain from getting overly fixated on the future, be able to live more in the present, emphasize her positives, give herself a "timeout" as needed, acquire the ability to say no when she needs to say no without guilt, let her faith be a resource for her emotionally as well as spiritually, practice "the pause" when she needs to do so, believe more in herself, understand her thoughts are "just thoughts" and are not necessarily going to play out in reality the way she might fear, pay attention to small steps of progress, use the "reset" button as needed, and realize the strength she shows working with goal-directed behaviors to move in a direction of improved emotional health and overall outlook.  Self rating scales: (updated 11/23/2022) 1-10 depression scale-5/6 1-10 anxiety scale-7 1-10 hopefulness scale-6/7  1-10 self-esteem scale-5/6  Goal review and progress/challenges noted with patient.  Next appointment within 2 weeks.   Mathis Fare, LCSW

## 2022-12-04 ENCOUNTER — Telehealth: Payer: Self-pay | Admitting: Cardiovascular Disease

## 2022-12-04 DIAGNOSIS — Z79899 Other long term (current) drug therapy: Secondary | ICD-10-CM

## 2022-12-04 DIAGNOSIS — E785 Hyperlipidemia, unspecified: Secondary | ICD-10-CM

## 2022-12-04 NOTE — Telephone Encounter (Signed)
Spoke to the patient,explained Brittany [name] [facility] recommendation:  Appt with PharmD is on 7/2. She just had her cholesterol checked on 5/7 and her LDL was very elevated at 187. Her goal is < 70. Ok to recheck her lipids before PharmD appt but lifestyle changes will not bring her LDL to goal, will still require medication.   Pt is scheduled with [facility] lab on 6/27, advised pt to fast 8 hours prior to appointment. Pt voiced understanding.

## 2022-12-04 NOTE — Telephone Encounter (Signed)
New Message:       Patient says she have changed the way she have been eating.and lost weight.  She would like to get her lab worked checked for her Cholesterol before her appointment with the pharmacist on 12-18-22.

## 2022-12-04 NOTE — Telephone Encounter (Signed)
Appt with PharmD is on 7/2. She just had her cholesterol checked on 5/7 and her LDL was very elevated at 187. Her goal is < 70. Ok to recheck her lipids before PharmD appt but lifestyle changes will not bring her LDL to goal, will still require medication.

## 2022-12-06 ENCOUNTER — Telehealth: Payer: Self-pay | Admitting: Pulmonary Disease

## 2022-12-06 NOTE — Telephone Encounter (Signed)
Pharmacy calling pt. Needs refills on fluticasone-salmeterol (ADVAIR) 250-50 MCG/ACT AEPB  but its saying too soon to fill for pt pleased advise

## 2022-12-07 ENCOUNTER — Ambulatory Visit (INDEPENDENT_AMBULATORY_CARE_PROVIDER_SITE_OTHER): Payer: HMO | Admitting: Psychiatry

## 2022-12-07 ENCOUNTER — Other Ambulatory Visit: Payer: Self-pay | Admitting: Pulmonary Disease

## 2022-12-07 DIAGNOSIS — F3162 Bipolar disorder, current episode mixed, moderate: Secondary | ICD-10-CM | POA: Diagnosis not present

## 2022-12-07 MED ORDER — FLUTICASONE-SALMETEROL 250-50 MCG/ACT IN AEPB: 1.0000 | INHALATION_SPRAY | Freq: Two times a day (BID) | RESPIRATORY_TRACT | 1 refills | Status: DC

## 2022-12-07 MED ORDER — AZELASTINE HCL 137 MCG/SPRAY NA SOLN: NASAL | 1 refills | Status: DC

## 2022-12-07 NOTE — Progress Notes (Signed)
[facility] Counselor/Therapist Progress Note  Patient ID: Brittany [name], MRN: [id number],    Date: 12/07/2022  Time Spent: 55 minutes   Treatment Type: Individual Therapy  Reported Symptoms: anxiety, some depression, frustration, anger, difficulty saying "No"  Mental Status Exam:  Appearance:   Casual     Behavior:  Appropriate, Sharing, and Motivated  Motor:  Normal and but is having some pain with right knee and has appt next week  Speech/Language:   Clear and Coherent  Affect:  Anxious, depressed  Mood:  anxious and depressed  Thought process:  goal directed  Thought content:    Rumination  Sensory/Perceptual disturbances:    WNL  Orientation:  oriented to person, place, time/date, situation, day of week, month of year, year, and stated date of 12/07/2022  Attention:  Good  Concentration:  Fair  Memory:  Some occasional   Fund of knowledge:   Good  Insight:    Good  Judgment:   Good  Impulse Control:  Good   Risk Assessment: Danger to Self:  No Self-injurious Behavior: No Danger to Others: No Duty to Warn:no Physical Aggression / Violence:No  Access to Firearms a concern: No  Gang Involvement:No   Subjective: Patient in session today participating well with active focus on her treatment goals and seems to be believing more in herself. Worked today on her anxiety, depression, frustration, some anger, and particularly focused on her ability to say "NO" when she needs to, as she has described how "not saying no, is related to a lot of her mood issues." Trying to say "NO" more often "especially in my spending". Shared how "things from my past are getting tripped up a lot but I am working to manage them differently" in ways that can help her heal versus feel "re-injured". Did read through handout I gave her last session on "being able to say No without feeling guilty" and processed her work with this.To continue her work with this between sessions. Sleep has been some  better. Energy improved. Reports trying to work on and interrupt her overthinking and wants to continue working on this between now and next session.  Good participation and good energy/motivation.  A sense of hopefulness.  Focus seems a little better today but still easily distracted at times.  Wants to believe in herself more.  Working to improve her self love and self acceptance and worry less about what others think about her.   Interventions: Cognitive Behavioral Therapy and Ego-Supportive  1) Long Term Goal: Develop strategies to reduce symptoms.     Short Term Goal: Reduce anxiety and improve coping skills.     Objective: Learn two new ways of coping with daily stressors.     Objective: Develop strategies for thought distraction when fixating on the future.     Objective: Manage panic episodes.   2) Long Term Goal: Maintain stability of mood     Short Term Goal: Increase ability to manage moods.     Objective: Discuss and resolve troubling personal and interpersonal issues.   Diagnosis:   ICD-10-CM   1. Bipolar 1 disorder, mixed, moderate (HCC)  F31.62      Plan:   Patient showing good motivation and participation in session today as she continued working on trying to set more appropriate limits and boundaries with others, and be able to say no when she needs to say no, as she realizes these behaviors impact her anxiety, depression, worrying, and difficulty believing in  herself.  Has shown some significant work in between sessions in completing her homework and trying new behaviors although the new behaviors have been a challenge.  She does seem pleased that she can see some progress.  Needs to continue with goal-directed behaviors so as to move in a forward direction.  Encouraged patient and her use of more positive/self affirming self talk as noted in session today including: Refrain from identifying herself as a "failure" which she often does, refrain from other forms of self judgment,  stay in contact with supportive people, remain involved in activities she knows helps her maintain stability in her mood, refrain from self negating, continue working on her strategies and thought distraction, refrain from getting overly fixated on the future, be able to live more in the present, emphasize her positives, give herself a "timeout" as needed, be able to say no when she needs to say no without guilt, let her faith be a resource for her emotionally as well as spiritually, practice "the pause "when she needs to do so, believe more in herself, understand her thoughts are "just thoughts" and are not necessarily going to play out in reality the way she might fear, pay attention to small steps of progress, use the "reset" button as needed, and recognize the strengths she shows working with goal-directed behaviors to move in a direction of improved emotional health and wellbeing.   Self rating scales: (updated 12/07/2022) 1-10 depression scale-5 1-10 anxiety scale-6/7 1-10 hopefulness scale-6/7  1-10 self-esteem scale-7   Goal review and progress/challenges noted with patient.  Next appointment within 2 weeks.   Mathis Fare, LCSW

## 2022-12-07 NOTE — Telephone Encounter (Signed)
Spoke with pharmacy. There are requesting a new script for Advair for 180 day supply. Script has been re-sent. She advised they would contact patient. NFN

## 2022-12-07 NOTE — Telephone Encounter (Signed)
Brittany [name] is checking on RX for Advair. Patient out of medication. Brittany phone number is [phone].

## 2022-12-07 NOTE — Addendum Note (Signed)
Addended byMathis Fare on: 12/07/2022 11:29 AM   Modules accepted: Level of Service

## 2022-12-11 DIAGNOSIS — M1711 Unilateral primary osteoarthritis, right knee: Secondary | ICD-10-CM | POA: Diagnosis not present

## 2022-12-13 ENCOUNTER — Ambulatory Visit: Payer: HMO | Attending: Cardiovascular Disease

## 2022-12-13 DIAGNOSIS — E785 Hyperlipidemia, unspecified: Secondary | ICD-10-CM

## 2022-12-13 DIAGNOSIS — Z79899 Other long term (current) drug therapy: Secondary | ICD-10-CM | POA: Diagnosis not present

## 2022-12-14 LAB — LIPID PANEL
Chol/HDL Ratio: 3.9 ratio (ref 0.0–4.4)
Cholesterol, Total: 256 mg/dL — ABNORMAL HIGH (ref 100–199)
HDL: 65 mg/dL (ref >39)
LDL Chol Calc (NIH): 179 mg/dL — ABNORMAL HIGH (ref 0–99)
Triglycerides: 72 mg/dL (ref 0–149)
VLDL Cholesterol Cal: 12 mg/dL (ref 5–40)

## 2022-12-18 ENCOUNTER — Ambulatory Visit: Payer: HMO | Attending: Interventional Cardiology | Admitting: Student

## 2022-12-18 DIAGNOSIS — E7849 Other hyperlipidemia: Secondary | ICD-10-CM | POA: Diagnosis not present

## 2022-12-18 DIAGNOSIS — E785 Hyperlipidemia, unspecified: Secondary | ICD-10-CM

## 2022-12-18 MED ORDER — EZETIMIBE 10 MG PO TABS: 10.0000 mg | ORAL_TABLET | Freq: Every day | ORAL | 3 refills | Status: DC

## 2022-12-18 NOTE — Progress Notes (Signed)
Patient ID: Brittany [name]                 DOB: 1956/04/04                    MRN: [id number]      HPI: Brittany [name] is a 67 y.o. female patient referred to lipid clinic by Dr.Nishan. PMH is significant for Asthma, COPD, GERD, statin induced myopathy, obesity, HLD, CAD.  Patient presented today for lipid clinic. Reports she had made major changes to her diet in past couple months and she has started doing regular exercise. She has tried Crestor and Lipitor various doses, due to myalgia she could not tolerate them. Even low dose of Crestor or Lipitor would give her muscle aches. We reviewed options for lowering LDL cholesterol, including ezetimibe, PCSK-9 inhibitors, bempedoic acid and inclisiran.  Discussed mechanisms of action, dosing, side effects and potential decreases in LDL cholesterol.  Also reviewed cost information and potential options for patient assistance.   Current Medications: Red yeast Rice 600 mg daily  Intolerances: Crestor ,Lipitor- muscle pain, suicidal ideation Risk Factors: CAD, CAC score 215, LDL goal: <70 mg/dl  Last lab : LDLc 119, TC 256, TG 72, HDL 65 (11/2022)  Diet: have GI issue no sugar, no bread and no fried food.  Mainly eats lean meat, eggs and vegetables   Exercise: walking 1 mile every day   Family History:      Relation Problem Comments  Mother (Deceased) Congestive Heart Failure   Heart failure   Hyperlipidemia   Hypertension     Father (Deceased) Other killed    Sister Metallurgist)   Brother Metallurgist)   Brother Metallurgist)   Brother (Deceased) COPD   Heart disease   Pulmonary embolism     Maternal Aunt (Deceased) Colon polyps     Paternal Aunt COPD a lot of aunts and uncle COPD or Emphysema    Paternal Uncle Emphysema        Labs: Lipid Panel     Component Value Date/Time   CHOL 256 (H) 12/13/2022 0800   TRIG 72 12/13/2022 0800   HDL 65 12/13/2022 0800   CHOLHDL 3.9 12/13/2022 0800   CHOLHDL 5 10/23/2022 0935   VLDL 14.8  10/23/2022 0935   LDLCALC 179 (H) 12/13/2022 0800   LDLCALC 166 (H) 04/06/2020 0916   LABVLDL 12 12/13/2022 0800    Past Medical History:  Diagnosis Date   Abnormal EKG    Allergy    Anxiety    Asthma    Bipolar depression (HCC), Anxiety, PTSD, Panic disorder    -managed by [facility]   Depression    Foot pain    GERD (gastroesophageal reflux disease)    Heart murmur    as a child   Insomnia    Leg swelling    Osteoporosis    osteopenia   Pneumonia due to COVID-19 virus 04/22/2019   Tachycardia     Current Outpatient Medications on File Prior to Visit  Medication Sig Dispense Refill   albuterol (PROVENTIL) (2.5 MG/3ML) 0.083% nebulizer solution Take 3 mLs (2.5 mg total) by nebulization every 6 (six) hours as needed for wheezing or shortness of breath. 75 mL 12   Albuterol Sulfate (PROAIR RESPICLICK) 108 (90 Base) MCG/ACT AEPB Inhale 2 puffs into the lungs every 6 (six) hours as needed. 1 each 6   Azelastine HCl 137 MCG/SPRAY SOLN SPRAY 1 SPRAY IN EACH NOSTRIL TWICE DAILY AS NEEDED 60 mL  1   b complex vitamins capsule Take 1 capsule by mouth daily.     busPIRone (BUSPAR) 15 MG tablet Take 1 tablet (15 mg total) by mouth 3 (three) times daily. 300 tablet 1   cetirizine (ZYRTEC) 10 MG tablet Take 1 tablet (10 mg total) by mouth daily. 90 tablet 1   Cholecalciferol (VITAMIN D-3 PO) Take 10,000 Units by mouth daily.      COLLAGEN PO Take by mouth.     diazepam (VALIUM) 5 MG tablet Take 1 tablet (5 mg total) by mouth every 6 (six) hours as needed. for anxiety 120 tablet 5   dicyclomine (BENTYL) 20 MG tablet TAKE ONE TABLET BY MOUTH FOUR TIMES A DAY AS NEEDED FOR SPASMS 360 tablet 3   doxazosin (CARDURA) 4 MG tablet Take 2 tablets (8 mg total) by mouth at bedtime. 200 tablet 1   famotidine (PEPCID) 20 MG tablet Take 1 tablet (20 mg total) by mouth 2 (two) times daily. (Patient taking differently: Take 20 mg by mouth as needed.) 180 tablet 3   ferrous sulfate 325 (65 FE) MG  EC tablet Take 325 mg by mouth 2 (two) times a week.     fluticasone (FLONASE) 50 MCG/ACT nasal spray Place 2 sprays into both nostrils in the morning and at bedtime. 96 mL 2   fluticasone-salmeterol (ADVAIR) 250-50 MCG/ACT AEPB Inhale 1 puff into the lungs every 12 (twelve) hours. 180 each 1   MAGNESIUM PO Calcium citrate, magnesium and vitamin D3 combo     mirtazapine (REMERON) 45 MG tablet Take 1 tablet (45 mg total) by mouth at bedtime. 100 tablet 1   montelukast (SINGULAIR) 10 MG tablet Take 1 tablet (10 mg total) by mouth at bedtime. 100 tablet 2   Multiple Vitamin (MULTIVITAMIN WITH MINERALS) TABS tablet Take 1 tablet by mouth daily.     pantoprazole (PROTONIX) 40 MG tablet Take 1 tablet (40 mg total) by mouth daily. 90 tablet 3   Red Yeast Rice 600 MG CAPS Take 1 capsule by mouth daily.     sodium chloride (OCEAN) 0.65 % SOLN nasal spray Place 1 spray into both nostrils as needed for congestion.     Current Facility-Administered Medications on File Prior to Visit  Medication Dose Route Frequency Provider Last Rate Last Admin   0.9 %  sodium chloride infusion  500 mL Intravenous Once Tressia Danas, MD       0.9 %  sodium chloride infusion  500 mL Intravenous Once Tressia Danas, MD        Allergies  Allergen Reactions   Aspirin Other (See Comments)    discomfort   Atorvastatin     Patient states she felt suicidal   Codeine Itching   Crestor [Rosuvastatin]     Dizzy, leg pain and states she felt suicidal   Gabapentin    Niacin And Related Itching   Sulfa Antibiotics     Other reaction(s): Unknown   Sulfamethoxazole-Trimethoprim Other (See Comments)    Unknown reaction per pt   Topiramate Other (See Comments)    Per patient, it causes stomach burning   Trileptal [Oxcarbazepine]     itching   Trimethoprim Other (See Comments)    Unknown, per pt   Ultram [Tramadol] Itching   Vioxx [Rofecoxib]     Unknown per pt    Assessment/Plan:  1. Hyperlipidemia -  Problem   Hyperlipidemia   Current Medications: Red yeast Rice 600 mg daily  Intolerances: Crestor ,Lipitor- muscle pain, suicidal ideation  Risk Factors: CAD, CAC score 215, LDL goal: <70 mg/dl  Last lab : LDLc 161, TC 256, TG 72, HDL 65 (11/2022)    Hyperlipidemia Assessment:  LDL goal: < 70 mg/dl last LDLc 096 mg/dl (09/5407) Intolerance to statins  Discussed next potential options (ezetimibe, PCSK-9 inhibitors, bempedoic acid and inclisiran); cost, dosing efficacy, side effects  Willing to try ezetimibe but Repatha co-pay is cost prohibitive for patient, however eligible for patient assistance for Repatha  Follows low fat diet and exercises regularly   Plan: Stop taking red yeast rice supplement  Start taking Zetia 10 mg daily will apply for patient assistance for Repatha  Follow up lab: LFT- on Aug 2, FLP in 2-3 months of starting Repatha     Thank you,  Carmela Hurt, Pharm.D [facility] [address], [place], [place] [address]  Phone: [phone]; Fax: [phone]

## 2022-12-18 NOTE — Assessment & Plan Note (Addendum)
Assessment:  LDL goal: < 70 mg/dl last LDLc 161 mg/dl (02/6044) Intolerance to statins  Discussed next potential options (ezetimibe, PCSK-9 inhibitors, bempedoic acid and inclisiran); cost, dosing efficacy, side effects  Willing to try ezetimibe but Repatha co-pay is cost prohibitive for patient, however eligible for patient assistance for Repatha  Follows low fat diet and exercises regularly   Plan: Stop taking red yeast rice supplement  Start taking Zetia 10 mg daily will apply for patient assistance for Repatha  Follow up lab: LFT- on Aug 2, FLP in 2-3 months of starting Repatha

## 2022-12-18 NOTE — Patient Instructions (Signed)
Your Results:             Your most recent labs Goal  Total Cholesterol 256 < 200  Triglycerides 72 < 150  HDL (happy/good cholesterol) 65 > 40  LDL (lousy/bad cholesterol 179 < 70   Medication changes:  Start taking Zetia 10 mg daily, will apply for patient assistance for Repatha     Repatha is a cholesterol medication that improved your body's ability to get rid of "bad cholesterol" known as LDL. It can lower your LDL up to 60%! It is an injection that is given under the skin every 2 weeks. he most common side effects of Repatha include runny nose, symptoms of the common cold, rarely flu or flu-like symptoms, back/muscle pain in about 3-4% of the patients, and redness, pain, or bruising at the injection site.   Lab orders: We want to repeat labs after 2-3 months.  We will send you a lab order to remind you once we get closer to that time.

## 2022-12-21 ENCOUNTER — Ambulatory Visit: Payer: HMO | Admitting: Psychiatry

## 2022-12-26 ENCOUNTER — Telehealth: Payer: Self-pay | Admitting: Pharmacist

## 2022-12-26 NOTE — Telephone Encounter (Signed)
Call to remind to drop off Repatha patient assistance form. Patient had family emergency so she was not able to bring it to [place] last week but she will drop it off at our [facility] location on July 11,2024.

## 2022-12-27 ENCOUNTER — Ambulatory Visit (INDEPENDENT_AMBULATORY_CARE_PROVIDER_SITE_OTHER): Payer: HMO | Admitting: Psychiatry

## 2022-12-27 ENCOUNTER — Telehealth: Payer: Self-pay | Admitting: Student

## 2022-12-27 DIAGNOSIS — F3162 Bipolar disorder, current episode mixed, moderate: Secondary | ICD-10-CM | POA: Diagnosis not present

## 2022-12-27 NOTE — Telephone Encounter (Signed)
Patient brought in Application for PharmD Carmela Hurt. Going to hand it to her

## 2022-12-27 NOTE — Progress Notes (Signed)
[facility] Counselor/Therapist Progress Note  Patient ID: Brittany [name], MRN: [id number],    Date: 12/27/2022  Time Spent: 53 minutes   Treatment Type: Individual Therapy  Reported Symptoms: anxiety, "no anger", fear/dread re: health issues "cholesterol" and is in touch with her doctor  Mental Status Exam:  Appearance:   Casual     Behavior:  Appropriate, Sharing, and Motivated  Motor:  Normal  Speech/Language:   Clear and Coherent  Affect:  anxious  Mood:  anxious  Thought process:  goal directed  Thought content:    Rumination and some obssessiveness  Sensory/Perceptual disturbances:    WNL  Orientation:  oriented to person, place, time/date, situation, day of week, month of year, year, and stated date of December 27, 2022  Attention:  Good  Concentration:  Good and Fair  Memory:  "Fair" , some short term memory issues and Dr is aware  Fund of knowledge:   Good  Insight:    Good and Fair  Judgment:   Good  Impulse Control:  Good   Risk Assessment: Danger to Self:  No Self-injurious Behavior: No Danger to Others: No Duty to Warn:no Physical Aggression / Violence:No  Access to Firearms a concern: No  Gang Involvement:No   Subjective:  Patient in session today motivated and actively participating in session reporting anxiety, blaming herself for things in the past and are out of her control, no anger, and fearful re: some health issues. Is in touch with her Dr and following any Dr recommendations. Focused more on financial limitations for herself, setting better limits with grandson, stepping out more in meeting other people particularly within her church and Bible study group that she has joined, and more contact with some of her neighbors.  Worked more on the tendency to blame herself for things that are out of her control.  Talks freely about certain changes that she needs to make but finding it very difficult to make those changes and maintain them.  Discussed this  more with patient today especially in terms of what gets in her way on this and how she might could approach some situations differently and have a better outcome, particularly in trying to follow through on her goals and in relationship to other people.  Energy continues to seem improved.  Does struggle with overthinking which affects her decision making and sticking with decisions at times.  Processed some of the overthinking that she does quite often and ways of interrupting that habit.  Less of a sense of hopelessness and instead is strengthening a sense of hopefulness.  Wants to believe in herself more and that she has the strength to make the changes she is wanting to make.  Does have worry cycles at times and worked on this some today especially as it interferes with decision making.  Interventions: Cognitive Behavioral Therapy and Ego-Supportive  1) Long Term Goal: Develop strategies to reduce symptoms.     Short Term Goal: Reduce anxiety and improve coping skills.     Objective: Learn two new ways of coping with daily stressors.     Objective: Develop strategies for thought distraction when fixating on the future.     Objective: Manage panic episodes. 2) Long Term Goal: Maintain stability of mood     Short Term Goal: Increase ability to manage moods.     Objective: Discuss and resolve troubling personal and interpersonal issues.    Diagnosis:   ICD-10-CM   1. Bipolar 1 disorder,  mixed, moderate (HCC)  F31.62      Plan: Patient today is motivated and working on focusing more on her goals related to her anxiety, overall coping skills especially in better managing stress and anxiety, relationship skills and setting limits with her spending that has been problematic for past few years. Is making some progress and needs to continue working on goal-directed behaviors to move in a forward direction.  Reviewed some of the work on limits and boundaries from last session which she found helpful.   Does see some progress. Encouraged patient in her use of positive/self affirming behaviors and self talk as noted in session today including: Refrain from identifying herself as a "failure", refrain from other forms of self judgment, stay in contact with supportive people, remain involved in activities she knows helps her maintain stability in her mood, continue work on her strategies regarding thought distraction, refrain from getting overly fixated on the future, be able to live more in the present, emphasize her positives, give herself a "timeout" as needed, be able to say no when she needs to say no without guilt, let her faith be a resource for her emotionally as well as spiritually, practice "the pause" when she needs to do so, believe more in herself, understand her anxious thoughts are "just thoughts" and are not necessarily going to play out in in reality the way she might fear, pay attention to small steps of progress, use the "reset" button as needed, and realize the strength she shows working with goal-directed behaviors to move in a direction of improved emotional health and overall outlook.  Self rating scales: (updated 12/27/2022) 1-10 depression scale-5 1-10 anxiety scale-6 1-10 hopefulness scale-7  1-10 self-esteem scale-7  Goal review and progress/challenges noted with patient.  Next appointment within 2 weeks.   Mathis Fare, LCSW

## 2022-12-28 MED ORDER — REPATHA SURECLICK 140 MG/ML ~~LOC~~ SOAJ: 140.0000 mg | SUBCUTANEOUS | 3 refills | Status: DC

## 2022-12-28 NOTE — Addendum Note (Signed)
Addended by: Tylene Fantasia on: 12/28/2022 09:31 AM   Modules accepted: Orders

## 2023-01-01 ENCOUNTER — Ambulatory Visit (INDEPENDENT_AMBULATORY_CARE_PROVIDER_SITE_OTHER): Payer: HMO | Admitting: Psychiatry

## 2023-01-01 ENCOUNTER — Telehealth: Payer: Self-pay | Admitting: Psychiatry

## 2023-01-01 ENCOUNTER — Encounter: Payer: Self-pay | Admitting: Psychiatry

## 2023-01-01 DIAGNOSIS — F4312 Post-traumatic stress disorder, chronic: Secondary | ICD-10-CM | POA: Diagnosis not present

## 2023-01-01 DIAGNOSIS — F515 Nightmare disorder: Secondary | ICD-10-CM | POA: Diagnosis not present

## 2023-01-01 DIAGNOSIS — F4001 Agoraphobia with panic disorder: Secondary | ICD-10-CM | POA: Diagnosis not present

## 2023-01-01 DIAGNOSIS — F5105 Insomnia due to other mental disorder: Secondary | ICD-10-CM | POA: Diagnosis not present

## 2023-01-01 DIAGNOSIS — F4310 Post-traumatic stress disorder, unspecified: Secondary | ICD-10-CM | POA: Diagnosis not present

## 2023-01-01 MED ORDER — MIRTAZAPINE 45 MG PO TABS: 45.0000 mg | ORAL_TABLET | Freq: Every day | ORAL | 1 refills | Status: DC

## 2023-01-01 MED ORDER — CLONAZEPAM 0.5 MG PO TABS
ORAL_TABLET | ORAL | 0 refills | Status: DC
Start: 2023-01-01 — End: 2023-03-27

## 2023-01-01 NOTE — Telephone Encounter (Signed)
Teriah called at 2:10 to report that insurance will not cover the Klonopin as written.The dose will need to be changed so she doesn't take so many per day or a PA needs to be done.

## 2023-01-01 NOTE — Telephone Encounter (Signed)
Will submit a PA for #400/100 day supply is the request with 4/day.

## 2023-01-01 NOTE — Progress Notes (Signed)
BIJAL SIGLIN [id number] 13-Sep-1955 67 y.o.     Subjective:   Patient ID:  Brittany [name] is a 67 y.o. (DOB 1955-07-16) female.  Chief Complaint:  Chief Complaint  Patient presents with   Follow-up   Depression   Anxiety    Anxiety Symptoms include nervous/anxious behavior and shortness of breath. Patient reports no confusion, decreased concentration, dizziness or suicidal ideas.    Depression        Associated symptoms include fatigue.  Associated symptoms include no decreased concentration and no suicidal ideas.  Past medical history includes anxiety.    Brittany [name] presents to the office today for follow-up of long-term depression and anxiety and insomnia.    At visit September 08, 2018 at which time the patient was weaned off of gabapentin and started on Trileptal up to twice daily.  She has been very med sensitive and difficult to treat because of that.  When seen September 19, 2018.  No meds were changed.  She stopped oxcarbazapine on her own DT itching.  seen July 2020.  She did not want to try any additional medications despite ongoing depression.  She wanted to stop lithium despite risk of worsening depression.  She was given instructions about how to do it in the safest manner possible.  Last seen August 12, 2019..   Still taking diazepam 5 TID, mirtazapine 15, doxazosin increased to 8 mg nightly for nightmares from 6 mg.    10/16/2019 appointment, the following noted: NM much better with increase prazosin to 8 mg HS and anxiety better that was related and tolerated.  Overall anxiety still a problem.  depression and anxiety much worse for a week after 2nd Covid vaccine recently.   Also overall more anxious and agoraphobia is bad.  Does force herself to go to [employer] weekly.   Averaging diazepam 5 mg BID-TID daily.  No drowsiness.  Avoids more at night bc of worsening NM from it. Weird dreams and occ awakens with panic.  Lost another "fur baby" that's 2 in 1 year early  2021.  $ and health stress.  Dreams are worse than last visit.  No SE.  Mirtazapine helps sleep generally and much better with it than without it.  Post Covid November 2020. Got significantly better in depression since here.  Prayed a lot about it and thought the lithium was helping.  Feels better now off it.  Was going through a lot at the time.  But after a month more peace and joy.   Buspirone helped.  Diazepam better than Xanax which she feels triggered dissociation and irritabilty.  Lost her dog which is hard.  Prayed and that helped.  Oldest GS joined special forces and gave her his car.  Anxiety driving but does it.   Plan: no med changes  02/09/20 appt with the following noted: Went back down on doxazosin to 4 mg bc of dizziness but it's not better.  She plans to talk to her other doctors about it.. CO anxiety and wanting to take more meds for it.  Anxiety without reason generally. Increased Diazepam from 5 mg TID to QID end May 2021.   Worsening mirtazapine also.  Has done some night eating without memory the next day. Doesn't seem to be anxiety causing insomnia.  Plan: Start topiramate off label for anxiety and weight loss at 25 mg daily and increase gradually to 100 mg daily.  06/21/2020 appointment with the following noted: Had problems with night terrors again  with stress around $ and pet sick.  Getting better again.   Still taking topiramate 100 mg started last visit. Lost weight DT stomach problems.  Doctors cannot explain.  Feels like poor absorption of nutrients.  Difficulty tolerating foods.  Dx bacterial overgrowth in small intestine but corrected. Stomach constantly burns.  Sx were present before topiramate? Anxiety is OK if stays home but is worse in public.  Meds are helping anxiety.  Not markedly depressed.  Meds are helping.  11/30/20 appt noted: Chronic anxiety averaging ? Amount daily of diazepam.  Anxiety is higher out of the house.  Gets out of the house regularly to  help care for 7 mos old GS.  D took her to beach last week.   Has 5 gallon fish tank with some guppies.   Occ night terrrors. Occ EMA Taking doxazosin 6 mg HS, 4 mg was less effective. Generally more trouble with anxiety than depres but some depression occ with $ stress and despise where she lives.   Ketogenic diet helped GI usually but some pain today.  Haunted with medical bills. Had Covid hosp 11 days and then home with O2 for a month.  Pulm said don't get Covid again.  Keeps her from going to church regularly but that would help. Plan: Also topiramate Continue buspirone 15 mg 3 times daily,  and mirtazapine 30 mg nightly for sleep.  Has been compliant with Valium.  She asks about an increase to increase.  OK continue Valium 5 mg  QID prn but take LED. Cont buspirone 15 mg TID, doxazosin 6-8 mg HS  03/29/2021 appointment with the following noted: Has aquarium. Doing OK except some seasonal depression and irritability noticed. Increased doxazosin between 6-8 mg HS.  Still having weird dreams but not having NM or night terrrors usually. No med changes  09/27/2021 appointment with the following noted: A lot better than normal Oct=Feb.  New GS, D married and some things to look forward to but still some anniversaries of losses.  Helping D with 54 mo old GS helps her a lot.  But did gain weight over the winter.  Dx CAD and put on Statin with cog and depressive and anxiety SE.  Current one not as bad but a little more anxiety and using a little more diazepam.  Nervous about people working in her appt. Going to [facility] on plane for first time with her family.  Excited but nervous too.   Plan no med changes  01/25/2022 appointment noted: No concerns about psych meds except Chronic $ stress.  Has to make choices about essentials.  I put myself into dept living on credit cards. Will go to sleep but EMA 40% of time despite mirtazapine.  Not usually a napper but on occ.    Can't tolerate statins.  Even with CoQ10. Chronic med sensitivity.  Brought Genesight test. Got SI about 2-4 weeks into statin and better off it after 3-4 weeks off it. Minimal depression at this time.  Some chronic anxiety which is worse than depression. Disc problematic night eating.  07/02/2022 phone call complaining of high depression and anxiety.  Appointment was moved up.  07/03/22 appointment noted: Current psych meds diazepam 5 mg every 6 hours as needed, mirtazapine 45 mg nightly, buspirone 15 mg 3 times daily, doxazosin 4 to 8 mg nightly prn. Thinks mood got worse when on statins.  Stopped it in summer.  Then took red yeast rice and had a little with it too.  Gained wt in summer from foot surgery.  $ stress from living on credit cards. Complaining of much worse depression and anxiety.  Increased SI persistent like 10-15 years ago. Don't like crowds.  Frustrated and irritable. Thoutghts jumbled. Was at diazepam 5 mg 3 daily and now 4 daily.  At times has taken 10 mg at a time. Plans to restart therapy.  Wants Christian therapist. Chronically fearful of psych meds and doesn't want to start something extra. Still a lot of GI problems. Plan: DC mirtazapine. Caplyta 42 mg HS trial  07/31/22 TC stopped Caplyta and doesn't want to try another med.  Complained of dizziness and being cold with it.  Didn't make her sleepy.    09/04/22 appt noted: Resumed mirtazapine 45 HS, diazepam 5 , buspirone 15 TID, doxazosine 4-8 mg HS. NM occ but less severe.   Still has some EMA.  She doesn't want to change doses. Seeing therapist has helped her cope and mood and anxiety is better.  She doesn't feel she needs more meds or other meds.  No severe mood swings.  Not severely depressed.  Anxiety manageable usually.   Was more irritable when visited D and 2yo grandson.  Did miss meds more while there. No SE with current meds. Insurance will cover 100 days of meds. I think I have a control problem.   Plan no changes  01/01/23  appt noted: Meds as above:  buspirone 15 TID, diazepam 5-10 mg TID prn, doxazosin 4-8 mg HS, mirtazapine 45 HS. Anxiety worse in the am.  Has increased diazepam use and feels it is less effective.  More $ strain DT inflation & credit card debt and trying to get out of the problem. Lost 30#.  Disc wt loss and temp sensitivity.  Stopped bread and sugar and dairy.   Tries to walk her dog daily.   Working on heart health bc genetics.   Night terrors stopped and reduced doxazosin but didn't slep as well.  So went back up and didn't solve the problems.   More EMA than before.  Sometimes naps an hour.   Mood "rollercoaster".  This weekend dep with SI without reason. New cholesterol med; not a statin.  HX statins with strong SI. Mind constantly racing on random things with some worry related to health and $. At times dep worse than anxiety.   Thinks mirtazapine may help her fall asleep faster.  Doesn't sleep any better with less than 45 mg mirtazapine.    Worries about weight gain with meds generally fearful of meds.  Chronic $ problems.  Stopped therapy about September 2020.  Multiple med failures.  Med sensitive & cost limit options. restoril, diazepam is ok. NO MORE Xanax DT dissociation and irritability,   Trazodone NR, Ambien, hyrdroxyzine, prazosin, doxazosin,  propranolol,  mirtazapine 45 sertraline, fluoxetine NR, Lexapro,   Buspar 15 TID, gabapentin,  Latuda, Olanzapine, risperidone with AE, Seroquel SE, Geodon, Saphris,  Abilify, Rexulti seemed to help for short while but $, Caplyta 42 dizzy  Trileptal itching, lithium NR, CBZ SE, Lamotrigine, VPA,  History statins strong SI  Review of Systems:  Review of Systems  Constitutional:  Positive for fatigue.  HENT:  Negative for congestion.   Respiratory:  Positive for shortness of breath and wheezing.   Gastrointestinal:  Negative for abdominal distention.  Musculoskeletal:  Positive for back pain.  Neurological:  Negative for  dizziness, tremors and light-headedness.  Psychiatric/Behavioral:  Positive for dysphoric mood and sleep disturbance. Negative for agitation,  behavioral problems, confusion, decreased concentration, hallucinations, self-injury and suicidal ideas. The patient is nervous/anxious. The patient is not hyperactive.        Please refer to HPI  Stress eating.  Medications: I have reviewed the patient's current medications.  Current Outpatient Medications  Medication Sig Dispense Refill   albuterol (PROVENTIL) (2.5 MG/3ML) 0.083% nebulizer solution Take 3 mLs (2.5 mg total) by nebulization every 6 (six) hours as needed for wheezing or shortness of breath. 75 mL 12   Albuterol Sulfate (PROAIR RESPICLICK) 108 (90 Base) MCG/ACT AEPB Inhale 2 puffs into the lungs every 6 (six) hours as needed. 1 each 6   Azelastine HCl 137 MCG/SPRAY SOLN SPRAY 1 SPRAY IN EACH NOSTRIL TWICE DAILY AS NEEDED 60 mL 1   b complex vitamins capsule Take 1 capsule by mouth daily.     busPIRone (BUSPAR) 15 MG tablet Take 1 tablet (15 mg total) by mouth 3 (three) times daily. 300 tablet 1   cetirizine (ZYRTEC) 10 MG tablet Take 1 tablet (10 mg total) by mouth daily. 90 tablet 1   Cholecalciferol (VITAMIN D-3 PO) Take 10,000 Units by mouth daily.      clonazePAM (KLONOPIN) 0.5 MG tablet 1 twice daily and 2 at night 400 tablet 0   COLLAGEN PO Take by mouth.     dicyclomine (BENTYL) 20 MG tablet TAKE ONE TABLET BY MOUTH FOUR TIMES A DAY AS NEEDED FOR SPASMS 360 tablet 3   doxazosin (CARDURA) 4 MG tablet Take 2 tablets (8 mg total) by mouth at bedtime. 200 tablet 1   Evolocumab (REPATHA SURECLICK) 140 MG/ML SOAJ Inject 140 mg into the skin every 14 (fourteen) days. 6 mL 3   ezetimibe (ZETIA) 10 MG tablet Take 1 tablet (10 mg total) by mouth daily. 90 tablet 3   famotidine (PEPCID) 20 MG tablet Take 1 tablet (20 mg total) by mouth 2 (two) times daily. (Patient taking differently: Take 20 mg by mouth as needed.) 180 tablet 3   ferrous  sulfate 325 (65 FE) MG EC tablet Take 325 mg by mouth 2 (two) times a week.     fluticasone (FLONASE) 50 MCG/ACT nasal spray Place 2 sprays into both nostrils in the morning and at bedtime. 96 mL 2   fluticasone-salmeterol (ADVAIR) 250-50 MCG/ACT AEPB Inhale 1 puff into the lungs every 12 (twelve) hours. 180 each 1   MAGNESIUM PO Calcium citrate, magnesium and vitamin D3 combo     montelukast (SINGULAIR) 10 MG tablet Take 1 tablet (10 mg total) by mouth at bedtime. 100 tablet 2   Multiple Vitamin (MULTIVITAMIN WITH MINERALS) TABS tablet Take 1 tablet by mouth daily.     pantoprazole (PROTONIX) 40 MG tablet Take 1 tablet (40 mg total) by mouth daily. 90 tablet 3   Red Yeast Rice 600 MG CAPS Take 1 capsule by mouth daily.     sodium chloride (OCEAN) 0.65 % SOLN nasal spray Place 1 spray into both nostrils as needed for congestion.     mirtazapine (REMERON) 45 MG tablet Take 1 tablet (45 mg total) by mouth at bedtime. 100 tablet 1   Current Facility-Administered Medications  Medication Dose Route Frequency Provider Last Rate Last Admin   0.9 %  sodium chloride infusion  500 mL Intravenous Once Tressia Danas, MD       0.9 %  sodium chloride infusion  500 mL Intravenous Once Tressia Danas, MD        Medication Side Effects: None  Allergies:  Allergies  Allergen Reactions   Aspirin Other (See Comments)    discomfort   Atorvastatin     Patient states she felt suicidal   Codeine Itching   Crestor [Rosuvastatin]     Dizzy, leg pain and states she felt suicidal   Gabapentin    Niacin And Related Itching   Sulfa Antibiotics     Other reaction(s): Unknown   Sulfamethoxazole-Trimethoprim Other (See Comments)    Unknown reaction per pt   Topiramate Other (See Comments)    Per patient, it causes stomach burning   Trileptal [Oxcarbazepine]     itching   Trimethoprim Other (See Comments)    Unknown, per pt   Ultram [Tramadol] Itching   Vioxx [Rofecoxib]     Unknown per pt    Past  Medical History:  Diagnosis Date   Abnormal EKG    Allergy    Anxiety    Asthma    Bipolar depression (HCC), Anxiety, PTSD, Panic disorder    -managed by [facility]   Depression    Foot pain    GERD (gastroesophageal reflux disease)    Heart murmur    as a child   Insomnia    Leg swelling    Osteoporosis    osteopenia   Pneumonia due to COVID-19 virus 04/22/2019   Tachycardia     Family History  Problem Relation Age of Onset   Heart failure Mother    Hypertension Mother    Hyperlipidemia Mother    Congestive Heart Failure Mother    Other Father        killed   COPD Paternal Aunt        a lot of aunts and uncle COPD or Emphysema   Emphysema Paternal Uncle    Colon polyps Maternal Aunt    COPD Brother    Heart disease Brother    Pulmonary embolism Brother    Breast cancer Neg Hx    Colon cancer Neg Hx    Esophageal cancer Neg Hx    Pancreatic cancer Neg Hx    Stomach cancer Neg Hx    Liver disease Neg Hx    Rectal cancer Neg Hx     Social History   Socioeconomic History   Marital status: Single    Spouse name: Not on file   Number of children: 3   Years of education: Not on file   Highest education level: Associate degree: occupational, Scientist, product/process development, or vocational program  Occupational History   Occupation: disabled  Tobacco Use   Smoking status: Former    Current packs/day: 0.00    Average packs/day: 20.0 packs/day for 1 year (20.0 ttl pk-yrs)    Types: Cigarettes    Start date: 06/18/1998    Quit date: 06/19/1999    Years since quitting: 23.5   Smokeless tobacco: Never   Tobacco comments:    20 year estimate but probably less   Vaping Use   Vaping status: Never Used  Substance and Sexual Activity   Alcohol use: Yes    Alcohol/week: 0.0 standard drinks of alcohol    Comment: occ   Drug use: No   Sexual activity: Never  Other Topics Concern   Not on file  Social History Narrative   Work or School: Disabled seconndary to psychiatric  conditions      Home Situation: lives alone with 2 cats and one dog      Spiritual Beliefs: Christian      Lifestyle: no regular exercise, diet  Not  great - wants to embark on healthier lifestyle   Social Determinants of Health   Financial Resource Strain: High Risk (10/22/2022)   Overall Financial Resource Strain (CARDIA)    Difficulty of Paying Living Expenses: Hard  Food Insecurity: Food Insecurity Present (10/22/2022)   Hunger Vital Sign    Worried About Running Out of Food in the Last Year: Often true    Ran Out of Food in the Last Year: Sometimes true  Transportation Needs: No Transportation Needs (10/22/2022)   PRAPARE - Administrator, Civil Service (Medical): No    Lack of Transportation (Non-Medical): No  Physical Activity: Sufficiently Active (10/22/2022)   Exercise Vital Sign    Days of Exercise per Week: 5 days    Minutes of Exercise per Session: 30 min  Stress: Stress Concern Present (10/22/2022)   [employer] of Occupational Health - Occupational Stress Questionnaire    Feeling of Stress : Rather much  Social Connections: Moderately Integrated (10/22/2022)   Social Connection and Isolation Panel [NHANES]    Frequency of Communication with Friends and Family: Three times a week    Frequency of Social Gatherings with Friends and Family: Once a week    Attends Religious Services: More than 4 times per year    Active Member of [employer] or Organizations: Yes    Attends Engineer, structural: More than 4 times per year    Marital Status: Divorced  Intimate Partner Violence: Not At Risk (05/24/2022)   Humiliation, Afraid, Rape, and Kick questionnaire    Fear of Current or Ex-Partner: No    Emotionally Abused: No    Physically Abused: No    Sexually Abused: No    Past Medical History, Surgical history, Social history, and Family history were reviewed and updated as appropriate.   Please see review of systems for further details on the patient's review  from today.   Objective:   Physical Exam:  There were no vitals taken for this visit.  Physical Exam Constitutional:      General: She is not in acute distress. Musculoskeletal:        General: No deformity.  Neurological:     Mental Status: She is alert and oriented to person, place, and time.     Cranial Nerves: No dysarthria.     Coordination: Coordination normal.  Psychiatric:        Attention and Perception: Attention and perception normal. She does not perceive auditory or visual hallucinations.        Mood and Affect: Mood is anxious and depressed. Affect is not labile, blunt, angry or inappropriate.        Speech: Speech normal. Speech is not rapid and pressured or slurred.        Behavior: Behavior normal. Behavior is cooperative.        Thought Content: Thought content normal. Thought content is not paranoid or delusional. Thought content does not include homicidal or suicidal ideation. Thought content does not include suicidal plan.        Cognition and Memory: Cognition and memory normal.        Judgment: Judgment normal.     Comments: Insight and judgment fair.  not overtly manic.  chronic $ stress Anxiety and depression  Talkative Racing thoughts     Lab Review:     Component Value Date/Time   NA 141 11/17/2021 0835   K 4.4 11/17/2021 0835   CL 102 11/17/2021 0835   CO2 25 11/17/2021 0835  GLUCOSE 94 11/17/2021 0835   GLUCOSE 97 05/10/2021 0934   BUN 16 11/17/2021 0835   CREATININE 0.75 11/17/2021 0835   CREATININE 0.94 04/06/2020 0916   CALCIUM 9.7 11/17/2021 0835   PROT 6.6 11/17/2021 0835   ALBUMIN 4.8 11/17/2021 0835   AST 16 11/17/2021 0835   ALT 23 11/17/2021 0835   ALKPHOS 68 11/17/2021 0835   BILITOT 0.4 11/17/2021 0835   GFRNONAA >60 05/01/2019 0155   GFRAA >60 05/01/2019 0155       Component Value Date/Time   WBC 4.4 05/10/2021 0934   RBC 5.04 05/10/2021 0934   HGB 15.5 (H) 05/10/2021 0934   HCT 45.9 05/10/2021 0934   PLT 318.0  05/10/2021 0934   MCV 91.1 05/10/2021 0934   MCH 31.7 04/06/2020 0916   MCHC 33.8 05/10/2021 0934   RDW 14.6 05/10/2021 0934   LYMPHSABS 0.8 05/10/2021 0934   MONOABS 0.3 05/10/2021 0934   EOSABS 0.1 05/10/2021 0934   BASOSABS 0.0 05/10/2021 0934    No results found for: "POCLITH", "LITHIUM"   Lab Results  Component Value Date   VALPROATE 25.3 (L) 10/26/2008     .res Assessment: Plan:    Vandora was seen today for follow-up, depression and anxiety.  Diagnoses and all orders for this visit:  PTSD (post-traumatic stress disorder) -     clonazePAM (KLONOPIN) 0.5 MG tablet; 1 twice daily and 2 at night -     mirtazapine (REMERON) 45 MG tablet; Take 1 tablet (45 mg total) by mouth at bedtime.  Panic disorder with agoraphobia -     clonazePAM (KLONOPIN) 0.5 MG tablet; 1 twice daily and 2 at night -     mirtazapine (REMERON) 45 MG tablet; Take 1 tablet (45 mg total) by mouth at bedtime.  Nightmares associated with chronic post-traumatic stress disorder -     mirtazapine (REMERON) 45 MG tablet; Take 1 tablet (45 mg total) by mouth at bedtime.  Insomnia due to mental condition -     clonazePAM (KLONOPIN) 0.5 MG tablet; 1 twice daily and 2 at night -     mirtazapine (REMERON) 45 MG tablet; Take 1 tablet (45 mg total) by mouth at bedtime.   Chronic noncompliance complicates treatment but bettter lately Likey borderline personality disorder vs PTSD complication.  30 min face to face time with patient was spent on counseling and coordination of care. We discussed her chronic sx of depression, anxiety and insomnia combined with medication sensitivity which makes it very difficult to treat her.  Multiple med failures.  Limited insight into bipolar vs PD and has refused mood stabilizers.  This was discussed with her repeatedly. But she has not had manic episodes off mood stabilizers. Seems unusual that she's not having manic episodes apparently without mood stabilizer.  ? Racing thought  related but not real prominent euphoria or irritability.  Cycles of dep.   Her faith helps.   At the visit July 03, 2022 she is complaining of more symptoms of depression and anxiety and irritability.  She is having additional financial stress but financial stress has been chronic.  She feels like her mood worsened while on statins and has never recovered since the summer 2023 but it is progressively worse. She has failed to respond to multiple SSRIs and other psychiatric medications.  She would like for some med change if it is possible to give her more relief.  Option clonidine We discussed the short-term risks associated with benzodiazepines including sedation and increased fall risk  among others.  She reports better anxiety control with diazepam as opposed to Xanax intially until now.  She does not want to be prescribed Xanax at any point in the future bc history of dissociation with it.  Discussed long-term side effect risk including dependence, potential withdrawal symptoms, and the potential eventual dose-related risk of dementia.  Evidently developing tolerance.  No higher BZ dose bc of this.  Disc risk.   Disc use of diazepam for fear of flying upcoming.    Reviewed Genesight results in detail.  Option Viibryd for anxiety.  T4 tier and she doesn't think she can afford.  Only options are generic.  Continue buspirone 15 mg 3 times daily,   mirtazapine per her request at 22.5-45 mg HS.  Marland Kitchen  Switch clonazepam 0.5 mg BID & 1mg  hS  Continue Vitamin D  FU 8 weeks.  Meredith Staggers, MD, DFAPA   Please see After Visit Summary for patient specific instructions.  Future Appointments  Date Time Provider Department Center  01/04/2023 10:00 AM Mathis Fare, LCSW [facility] None  01/18/2023  9:15 AM [facility]  01/18/2023 10:00 AM Mathis Fare, LCSW [facility] None  02/01/2023 10:00 AM Mathis Fare, LCSW [facility] None  02/15/2023 10:00 AM Mathis Fare, LCSW [facility] None   03/01/2023 10:00 AM Mathis Fare, LCSW [facility] None  04/25/2023  9:30 AM Karie Georges, MD [facility]  05/30/2023 10:15 AM [facility]    No orders of the defined types were placed in this encounter.     -------------------------------

## 2023-01-02 NOTE — Telephone Encounter (Signed)
Prior Authorization submitted through [employer] for quantity, pending determination.

## 2023-01-02 NOTE — Telephone Encounter (Signed)
Quantity approved for pt's Clonazepam 0.5 mg

## 2023-01-02 NOTE — Telephone Encounter (Signed)
[employer] approved CLONAZEPAM 0.5mg    approved for 4 per day. 01/02/23-06/18/23

## 2023-01-04 ENCOUNTER — Telehealth: Payer: Self-pay

## 2023-01-04 ENCOUNTER — Ambulatory Visit (INDEPENDENT_AMBULATORY_CARE_PROVIDER_SITE_OTHER): Payer: HMO | Admitting: Psychiatry

## 2023-01-04 ENCOUNTER — Other Ambulatory Visit (HOSPITAL_COMMUNITY): Payer: Self-pay

## 2023-01-04 DIAGNOSIS — F3162 Bipolar disorder, current episode mixed, moderate: Secondary | ICD-10-CM | POA: Diagnosis not present

## 2023-01-04 NOTE — Telephone Encounter (Signed)
Pharmacy Patient Advocate Encounter   Received notification from [facility] that prior authorization for REPATHA is required/requested for patient assistance purposes.   Insurance verification completed.   The patient is insured through [employer]/ [employer] .   Per test claim: PA started via CoverMyMeds. KEY [id number] .

## 2023-01-07 NOTE — Progress Notes (Signed)
[facility] Counselor/Therapist Progress Note  Patient ID: Brittany [name], MRN: [id number],    Date: 01/07/2023  Time Spent: 53 minutes   Treatment Type: Individual Therapy  Reported Symptoms: anxiety, depression, relationship issues  Mental Status Exam:  Appearance:   Casual     Behavior:  Appropriate, Sharing, and Motivated  Motor:  Normal  Speech/Language:   Clear and Coherent  Affect:  Depressed and anxious  Mood:  anxious and depressed  Thought process:  goal directed  Thought content:    Rumination and some obssessive thinking  Sensory/Perceptual disturbances:    WNL  Orientation:  oriented to person, place, time/date, situation, day of week, month of year, year, and stated date of Jly 22, 2024  Attention:  Good  Concentration:  Good and Fair  Memory:  Some short term memory issues and Dr is aware  Fund of knowledge:   Good and Fair  Insight:    Good  Judgment:   Good  Impulse Control:  Good   Risk Assessment: Danger to Self:  No Self-injurious Behavior: No Danger to Others: No Duty to Warn:no Physical Aggression / Violence:No  Access to Firearms a concern: No  Gang Involvement:No   Subjective: Patient in session today reporting further symptoms of anxiety, depression, blaming herself for things in the past that are out of her control, and some fearfulness regarding potential health issues.  Denies any anger currently.  Difficulty following through on some therapeutic strategies between sessions which we discussed today.  Is trying to participate more in activities with other people including a Bible study group and having more contact with neighbors that are healthier for her and setting limits were needed.  Easy to blame herself.  Wants to make changes and finds it difficult to actually follow through and make changes and then be able to maintain those changes as we worked on some last session.  Reviewed some of this with patient today in addition to how she  might be more successful with certain changes and being sure to feel positive even with some small changes right now.  Working on removing certain barriers to progression on her goals.  Energy seems good.  Reports feeling less hopelessness and overall some increase in hopefulness.  Trying to have more belief in herself and using more positive self talk.  Continues to have some difficulty with making decisions due to her "worry cycles" at times and we discussed some ways that she might work to break free of the cycles.  Interventions: Cognitive Behavioral Therapy and Ego-Supportive  1) Long Term Goal: Develop strategies to reduce symptoms.     Short Term Goal: Reduce anxiety and improve coping skills.     Objective: Learn two new ways of coping with daily stressors.     Objective: Develop strategies for thought distraction when fixating on the future.     Objective: Manage panic episodes. 2) Long Term Goal: Maintain stability of mood     Short Term Goal: Increase ability to manage moods.     Objective: Discuss and resolve troubling personal and interpersonal issues.   Diagnosis:   ICD-10-CM   1. Bipolar 1 disorder, mixed, moderate (HCC)  F31.62      Plan:  Patient in today and continues to show good motivation, working further on her anxiety, overall coping skills especially related to managing stress and anxiety in healthier ways, relationship skills and trying to develop better habits and mindset about money management in healthier ways.  Coping skills improving some gradually especially in trying to better manage stress and anxiety.  Has worked further on some relationship skills especially regarding at least one of her friendships that is potentially unhealthy.  Worked further on limits and boundaries in relationships.  Patient is making progress and needs to continue her work with goal-directed behaviors in order to keep moving in a forward direction. Encouraged patient in using more  positive/self affirming behaviors and encouraging self talk as noted in session today including: Refrain from identifying herself as a "failure", refrain from other forms of self judgment, stay in contact with supportive people, stay involved in activities that she knows helps her maintain stability in her mood, continue work on her strategies regarding thought distraction, refrain from getting overly fixated on the future, be able to live more in the present, emphasize her positives, give herself a timeout as needed, be able to say no when she needs to say no without guilt, let her faith be a resource for her emotionally as well as spiritually, practice "the pause" when she needs to do so, believe more in herself, understand her anxious thoughts are "just thoughts" and are not necessarily going to play out in reality the way she might assume, pay attention to small steps of progress, use the "reset" button as needed, and recognize the strength she shows working with goal-directed behaviors to move in a direction of improved emotional health and overall wellbeing.  Self rating scales:  1-10 depression scale-4/5 1-10 anxiety scale-6/7 1-10 hopefulness scale-7  1-10 self-esteem scale-7  Goal review and progress/challenges noted with patient.  Next appointment within 2 weeks.   Mathis Fare, LCSW

## 2023-01-08 ENCOUNTER — Ambulatory Visit: Payer: HMO | Admitting: Family Medicine

## 2023-01-08 ENCOUNTER — Encounter: Payer: Self-pay | Admitting: Family Medicine

## 2023-01-08 VITALS — BP 100/70 | HR 85 | Temp 98.7°F | Wt 171.2 lb

## 2023-01-08 DIAGNOSIS — R059 Cough, unspecified: Secondary | ICD-10-CM | POA: Diagnosis not present

## 2023-01-08 DIAGNOSIS — J019 Acute sinusitis, unspecified: Secondary | ICD-10-CM

## 2023-01-08 DIAGNOSIS — M25561 Pain in right knee: Secondary | ICD-10-CM | POA: Diagnosis not present

## 2023-01-08 LAB — POC COVID19 BINAXNOW: SARS Coronavirus 2 Ag: NEGATIVE

## 2023-01-08 MED ORDER — AMOXICILLIN-POT CLAVULANATE 875-125 MG PO TABS: 1.0000 | ORAL_TABLET | Freq: Two times a day (BID) | ORAL | 0 refills | Status: DC

## 2023-01-08 NOTE — Telephone Encounter (Signed)
Patient made aware of approval. Patient called [employer] and she has cleared the income question and patient were told she should hear from them in next 48 hrs.

## 2023-01-08 NOTE — Telephone Encounter (Signed)
Pt called in about her mychart message. She states she is confused on what additional information is needed. Please advise.

## 2023-01-08 NOTE — Telephone Encounter (Signed)
Pharmacy Patient Advocate Encounter  Received notification from [employer]/ [employer] that Prior Authorization for REPATHA has been APPROVED from 7.19.24 to 1.19.25.

## 2023-01-08 NOTE — Progress Notes (Signed)
   Subjective:    Patient ID: Brittany [name], female    DOB: 1955/09/26, 67 y.o.   MRN: [id number]  HPI Here for 5 days of sinus pressure, PND, ST, and coughing up clear sputum. She had some fever at first but not now. She has asthma, and this infection has caused some mild SOB. She has used her nebulizer a few times, and this worked quite well. Taking Nyquil and Dayquil.    Review of Systems  Constitutional:  Positive for fever.  HENT:  Positive for congestion, postnasal drip, sinus pressure and sore throat. Negative for ear pain.   Eyes: Negative.   Respiratory:  Positive for cough, shortness of breath and wheezing.   Cardiovascular:  Negative for chest pain.       Objective:   Physical Exam Constitutional:      Appearance: Normal appearance. She is not ill-appearing.  HENT:     Right Ear: Tympanic membrane, ear canal and external ear normal.     Left Ear: Tympanic membrane, ear canal and external ear normal.     Nose: Nose normal.     Mouth/Throat:     Pharynx: Oropharynx is clear.  Eyes:     Conjunctiva/sclera: Conjunctivae normal.  Cardiovascular:     Rate and Rhythm: Normal rate and regular rhythm.     Pulses: Normal pulses.     Heart sounds: Normal heart sounds.  Pulmonary:     Effort: Pulmonary effort is normal. No respiratory distress.     Breath sounds: Wheezing present. No rhonchi or rales.  Lymphadenopathy:     Cervical: No cervical adenopathy.  Neurological:     Mental Status: She is alert.           Assessment & Plan:  Sinusitis, treat with 10 days of Augmentin.  Gershon Crane, MD

## 2023-01-18 ENCOUNTER — Ambulatory Visit: Payer: HMO | Admitting: Psychiatry

## 2023-01-18 ENCOUNTER — Ambulatory Visit: Payer: HMO

## 2023-01-18 DIAGNOSIS — F4310 Post-traumatic stress disorder, unspecified: Secondary | ICD-10-CM

## 2023-01-18 NOTE — Progress Notes (Signed)
[facility] Counselor/Therapist Progress Note  Patient ID: Brittany [name], MRN: [id number],    Date: 01/18/2023  Time Spent: 58 minutes   Treatment Type: Individual Therapy  Reported Symptoms: anxiety (better), depression, loneliness, trauma from past, racing thoughts much decreased  Mental Status Exam:  Appearance:   Casual     Behavior:  Appropriate, Sharing, and Motivated  Motor:  Normal  Speech/Language:   Clear and Coherent  Affect:  Depressed and anxious  Mood:  anxious and some irritability and over-sensitive towards others  Thought process:  goal directed  Thought content:    Some obsessiveness in thougths  Sensory/Perceptual disturbances:    WNL  Orientation:  oriented to person, place, time/date, situation, day of week, month of year, year, and stated date of Aug. 2, 2024  Attention:  Good  Concentration:  Good and Fair  Memory:  WNL  Fund of knowledge:   Good  Insight:    Good and Fair  Judgment:   Good  Impulse Control:  Good   Risk Assessment: Danger to Self:  No Self-injurious Behavior: No Danger to Others: No Duty to Warn:no Physical Aggression / Violence:No  Access to Firearms a concern: No  Gang Involvement:No   Subjective:  Patient in session today, trying to manage feelings related to her PTSD, focusing more on her anxiety, blaming herself for things in the past that are out of her control, depression, and fearfulness related more to potential health issues and the future (things related to her and some that are not personally related but weight on her).  Working on her not blaming herself as much as we talked about in last session, especially when things do not go as planned or wanted. Shared several incidents recently where she felt hurt and possibly misunderstood what had been said to her by a couple other women from her church group and left abruptly. Due to her past history of hurt and trauma, patient assumed a lot of negativity and was able to  talk through this in session today. Very hard for patient to see and consider other ways of viewing things other than her own assumptions which are typically defensive and self-limiting. Worked with her on this trying to help her understand how she sometimes limits herself by assuming things can't change, including herself at times.  Did seem more open towards end of session in terms of considering some of the ways she is blocking herself either unintentionally or in efforts to what she feels may be self-protection in not getting hurt again.  Continue to work with her on removing certain barriers in order for her to move forward.  More emotionally challenged today and realizing how some of the negatives from her past are very hard to let go of and not recycle.  Interventions: Cognitive Behavioral Therapy and Ego-Supportive  1) Long Term Goal: Develop strategies to reduce symptoms.     Short Term Goal: Reduce anxiety and improve coping skills.     Objective: Learn two new ways of coping with daily stressors.     Objective: Develop strategies for thought distraction when fixating on the future.     Objective: Manage panic episodes. 2) Long Term Goal: Maintain stability of mood     Short Term Goal: Increase ability to manage moods.     Objective: Discuss and resolve troubling personal and interpersonal issues.   Diagnosis:   ICD-10-CM   1. PTSD (post-traumatic stress disorder)  F43.10  Plan:  Patient in for session today sensing some progress as she continues to work on her anxiety and coping skills especially related to managing stress and anxiety in healthier ways, setting appropriate limits and boundaries within relationships, and developing a healthier mindset and strategies regarding many management.  Also sensing the challenges she faces and trying to work through challenges from her past.  Continues to work on coping skills to better manage stress and anxiety "especially in the moment".   Also working on relationship skills and "trying to not rush to judgment".  Needing healthy limits and boundaries in relationship but also being able to risk trusting again.  She does recognize some of the progress she is making and needs to continue her work with goal-directed behaviors to keep moving in a forward direction. Reminded and encouraged patient in her use of more positive/self affirming behaviors in addition to encouraging self talk including: Refraining from identifying herself as a "failure", stay in contact with supportive people, remain involved in activities that she knows helps her maintain stability in her mood, continue work on her strategies regarding thought distraction, refrain from getting overly fixated on the future, be able to live more in the present, emphasize her positives, give herself a timeout as needed, be able to say no when she needs to say no, letting her faith be a resource for her emotionally as well as spiritually, practice "the pause" as needed, believe more in herself, understand that her anxious thoughts are "just thoughts" and are not necessarily going to play out in reality the way she might assume, use the "reset" button as needed, and recognize the strengths she shows working with goal-directed behaviors to move and a direction of improved emotional health and outlook into the future.  Goal review and progress/challenges noted with patient.  Next appointment within 2 weeks.   Mathis Fare, LCSW

## 2023-02-01 ENCOUNTER — Ambulatory Visit (INDEPENDENT_AMBULATORY_CARE_PROVIDER_SITE_OTHER): Payer: HMO | Admitting: Psychiatry

## 2023-02-01 DIAGNOSIS — F4310 Post-traumatic stress disorder, unspecified: Secondary | ICD-10-CM

## 2023-02-01 NOTE — Progress Notes (Signed)
[facility] Counselor/Therapist Progress Note  Patient ID: Brittany [name], MRN: [id number],    Date: 02/01/2023  Time Spent: 55 minutes   Treatment Type: Individual Therapy  Reported Symptoms: anxiety "sky high", depression, overthinking, overstressing over things I can't control, loneliness at times  Mental Status Exam:  Appearance:   Casual     Behavior:  Appropriate, Sharing, and Motivated  Motor:  Normal  Speech/Language:   Clear and Coherent  Affect:  Depressed and anxious  Mood:  anxious and depressed  Thought process:  goal directed  Thought content:    Rumination  Sensory/Perceptual disturbances:    WNL  Orientation:  oriented to person, place, time/date, situation, day of week, month of year, year, and stated date of Aug. 16, 2024  Attention:  Good  Concentration:  Good and Fair  Memory:  WNL  Fund of knowledge:   Good  Insight:    Good and Fair  Judgment:   Good  Impulse Control:  Good   Risk Assessment: Danger to Self:  No Self-injurious Behavior: No Danger to Others: No Duty to Warn:no Physical Aggression / Violence:No  Access to Firearms a concern: No  Gang Involvement:No   Subjective:  Patient in for session today reporting anxiety "sky high", depression, over-stressing over things I can't control, and some loneliness. Focusing today more on her anxiety, being open to trying different strategies, and difficulty setting some healthy boundaries with certain issues with adult children (son's family). Trying to help patient be more willing to try different strategies versus assume negatives. "Wants to be a more purposeful person" and did discuss some helpful options for her including clearing her home of more clutter, wants to be able to walk more, wants to explore more, and eventually have more happiness and fulfillment. Not quite as much blaming of self today for the past. Processed the fact that self-care is not selfish.  Working to not assume the  "negatives" in situations as that feeds her depression and anxiety. Working also not to recycle the negatives.   Interventions: Cognitive Behavioral Therapy and Ego-Supportive  1) Long Term Goal: Develop and follow through on strategies to reduce symptoms.     Short Term Goal: Reduce anxiety and improve coping skills.     Objective: Learn two new ways of coping with daily stressors.     Objective: Develop strategies for thought distraction when fixating on the future.     Objective: Manage panic episodes. 2) Long Term Goal: Maintain stability of mood     Short Term Goal: Increase ability to manage moods.     Objective: Discuss and resolve troubling personal and interpersonal issues.   Diagnosis:   ICD-10-CM   1. PTSD (post-traumatic stress disorder)  F43.10      Plan: Patient in session today reporting that she is sensing some more progress and therapist encouraging her today to continue working on improved coping skills especially in managing stress and anxiety in healthier ways, setting appropriate boundaries and limits in relationships, not over committing herself, and developing a healthier mindset and strategies regarding money management.  Continues to spend part of her time and working on some of her challenges from the past and seems to feel she is gaining some in that area.  Intentionally focusing with her at times on her management of stress and anxiety "in the moment".  Wanting and needing healthier limits and boundaries in relationships but also being able to take risks in order to grow.  Patient is making progress and needs to continue her work with goal-directed behaviors moving in a forward direction. Reminded and encouraged patient in her use of more positive/self affirming behaviors as noted in session including: More encouraging/positive self talk, refrain from identifying herself as a "failure", remain in contact with supportive people, stay involved in activities that she knows  helps her maintain stability in her mood, continue work on her strategies regarding thought distraction, refrain from getting overly fixated on the future, be able to live more in the present, emphasize her positives, give herself a timeout as needed, be able to say no when she needs to say no, letting her faith be a resource for her emotionally as well as spiritually, practice "the pause" as needed, have more belief in herself and her ability to make changes, understand that her anxious thoughts are "just thoughts" and are not necessarily going to play out in reality the way she might assume, use the "reset" button as needed, and realize the strength she shows working with goal-directed behaviors to move in a direction of improved emotional health and outlook into the future.  Goal review and progress/challenges noted with patient.  Next appointment within 2 weeks.   Mathis Fare, LCSW

## 2023-02-15 ENCOUNTER — Ambulatory Visit (INDEPENDENT_AMBULATORY_CARE_PROVIDER_SITE_OTHER): Payer: HMO | Admitting: Psychiatry

## 2023-02-15 DIAGNOSIS — M25561 Pain in right knee: Secondary | ICD-10-CM | POA: Diagnosis not present

## 2023-02-15 DIAGNOSIS — F4310 Post-traumatic stress disorder, unspecified: Secondary | ICD-10-CM

## 2023-02-15 NOTE — Progress Notes (Signed)
[facility] Counselor/Therapist Progress Note  Patient ID: COZY EADS, MRN: [id number],    Date: 02/15/2023  Time Spent: 55 minutes   Treatment Type: Individual Therapy  Reported Symptoms: anxiety "but not quite as high as previous session", depression, overthinking, overstressing, some loneliness, needing to follow through on healthier strategies as discussed in sessions, unhealthy decisions  Mental Status Exam:  Appearance:   Casual     Behavior:  Appropriate, Sharing, and Motivated  Motor:  Normal  Speech/Language:   Clear and Coherent  Affect:  Anxious, some depression  Mood:  anxious and depressed  Thought process:  goal directed  Thought content:    WNL  Sensory/Perceptual disturbances:    WNL  Orientation:  oriented to person, place, time/date, situation, day of week, month of year, year, and stated date of Aug. 30, 2024  Attention:  Good/Fair  Concentration:  Good and Fair  Memory:  Some short term issues at times and Dr is aware  Fund of knowledge:   Good  Insight:    Good and Fair  Judgment:   Good and Fair  Impulse Control:  Good and Fair   Risk Assessment: Danger to Self:  No Self-injurious Behavior: No Danger to Others: No Duty to Warn:no Physical Aggression / Violence:No  Access to Firearms a concern: No  Gang Involvement:No   Subjective: Patient in today and we worked hard to cover multiple bases as far as her concerns and what she is working on in order to feel better.  Has joined [employer] to do "some basic exercises" went to 2 food pantry's and got assistance and that has been helpful, except "there was an issue with food diabetic shoe and use so I exchanged some items out".  Patient states that she does not have the willpower to not eat the things that she should.  Processing more "hard feelings" of situations at a church she has attended, encouraging her to be able to vent more but also eventually work towards "letting go for her own sake" and  being able to move forward rather than harboring resentment.  We looked at other areas of her life.  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  Some loneliness and needing realistic expectations of others, "depression has not been as often but when it is, it is a problem,".  Today the depression she discussed seem to be mostly related to issues with her family, neighbors, church, and boundary issues with her adult children at times.  Shared and needed to process some "church hurt" at a church that she has been attending.  Continue to try and encouraged patient to be willing to try different strategies versus assuming negatives and "staying stuck".  States that she wants to be a more purposeful person and we discussed more options today of how that can happen for her and some changes that need to occur, and can occur gradually.  Trying not to assume the negatives in most cases nor to be "recycling the negatives".  Interventions: Cognitive Behavioral Therapy and Ego-Supportive  1) Long Term Goal: Develop and follow through on strategies to reduce symptoms.     Short Term Goal: Reduce anxiety and improve coping skills.     Objective: Learn two new ways of coping with daily stressors.     Objective: Develop strategies for thought distraction when fixating on the future.     Objective: Manage panic episodes. 2) Long Term Goal: Maintain  stability of mood     Short Term Goal: Increase ability to manage moods.     Objective: Discuss and resolve troubling personal and interpersonal issues.   Diagnosis:   ICD-10-CM   1. PTSD (post-traumatic stress disorder)  F43.10      Plan:  Patient in today motivated and actively participating in session as she worked further on her management of stress, hurt feelings, anxiety, trying to have healthy boundaries, not make negative assumptions about others in relation to her, and trying to have a healthier mindset and effective strategies regarding  money management/choices.  Patient is working with her goals and making progress.  She needs to continue working with goal-directed behaviors to keep moving and a more hopeful and positive direction.  Encouraged her in her use of more positive/self affirming behaviors as noted in session including: Use of more positive/encouraging self talk, refrain from identifying herself as a "failure", stay in contact with supportive people, remain involved in activities that help her maintain stability in mood, refrain from getting overly fixated on the future, continue working on her strategies regarding thought distractions, live in the present more, emphasize her on positives, give herself a timeout for "reset" as needed, be able to say no when she needs to say no, letting her faith be a resource for her emotionally as well as spiritually, practice "the pause" as needed, have more belief in herself and her ability to make changes and healthy friendships, understand that her anxious thoughts are "just thoughts" and are not necessarily going to play out in reality the way she might assume, and recognize the strength she shows working with goal-directed behaviors to move in a direction of improved emotional health and overall wellbeing.  Goal review progress/challenges noted with patient.  Next appointment within 2 weeks.   Mathis Fare, LCSW

## 2023-02-25 ENCOUNTER — Telehealth: Payer: Self-pay | Admitting: Family Medicine

## 2023-02-25 NOTE — Telephone Encounter (Signed)
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  Pt would like to know what vaccinations she should get to ensure her daughter's health safety? Please return Pt's call at your earliest convenience.

## 2023-02-26 NOTE — Telephone Encounter (Signed)
Patient informed of the message below.

## 2023-02-26 NOTE — Telephone Encounter (Signed)
She has had all vaccinations except for the seasonal flu and COVID booster. She can get these at any pharmacy.

## 2023-02-26 NOTE — Telephone Encounter (Signed)
Pt call and stated she was talking to you and you couldn't here her so she want to give her a call back on this # [phone].

## 2023-03-01 ENCOUNTER — Ambulatory Visit (INDEPENDENT_AMBULATORY_CARE_PROVIDER_SITE_OTHER): Payer: HMO | Admitting: Psychiatry

## 2023-03-01 DIAGNOSIS — F4310 Post-traumatic stress disorder, unspecified: Secondary | ICD-10-CM | POA: Diagnosis not present

## 2023-03-01 NOTE — Progress Notes (Signed)
[facility] Counselor/Therapist Progress Note  Patient ID: Brittany [name], MRN: [id number],    Date: 03/01/2023  Time Spent: 50 minutes   Treatment Type: Individual Therapy  Reported Symptoms: unhealthy decisions but is decreasing that, loneliness decreased some, overthinking, overstressing decreasing, trying to follow through on strategies as noted in session today  Mental Status Exam:  Appearance:   Casual     Behavior:  Appropriate, Sharing, and Motivated  Motor:  Normal  Speech/Language:   Clear and Coherent  Affect:  Anxious  Mood:  anxious  Thought process:  goal directed  Thought content:    Rumination  Sensory/Perceptual disturbances:    WNL  Orientation:  oriented to person, place, time/date, situation, day of week, month of year, year, and stated date of Sept. 13, 2024  Attention:  Good  Concentration:  Good and Fair  Memory:  WNL  Fund of knowledge:   Good  Insight:    Good and Fair  Judgment:   Good  Impulse Control:  Good   Risk Assessment: Danger to Self:  No Self-injurious Behavior: No Danger to Others: No Duty to Warn:no Physical Aggression / Violence:No  Access to Firearms a concern: No  Gang Involvement:No   Subjective:   Patient in session today reporting higher anxiety but not really depressed. States a lot has been going on including: daughter diagnosed with 2A breast cancer, 53 yr old grandson hit by car in [place] and is back home now and to have a surgery on his leg, and issues with neighbor with whom patient has had "issues".  Having some difficulties that seem to be continuing at her apt complex with a particular lady. Patient involved in situationswhere there is differing opinions, and has had recent interpersonal issues with a neighbor; very hard for her to not keep responding to others that disagree with patient. Talked through a couple more of these situations within her apt community which seemed helpful for patient. Processed more of  her "church hurt" and encouraged behaviors that could help her when things like that occur, particularly behaviors that do not add to nor keep negative communication going.  Letting go, is very difficult.  Focused more on her daughter's cancer as noted above as patient needed to vent more of her thoughts and feelings, and shared a lot of support she has already received from other people as she shared information with them.  Stated that she seems to feel better about some of those people based on the way they have responded with concern about her daughter.  Trying not to assume negatives nor recycle the negatives.  Encouraged behaviors and contacts that she feels supported by and is healthy for her emotionally.  Interventions: Cognitive Behavioral Therapy and Ego-Supportive  1) Long Term Goal: Develop and follow through on strategies to reduce symptoms.     Short Term Goal: Reduce anxiety and improve coping skills.     Objective: Learn two new ways of coping with daily stressors.     Objective: Develop strategies for thought distraction when fixating on the future.     Objective: Manage panic episodes. 2) Long Term Goal: Maintain stability of mood     Short Term Goal: Increase ability to manage moods.     Objective: Discuss and resolve troubling personal and interpersonal issues.   Diagnosis:   ICD-10-CM   1. PTSD (post-traumatic stress disorder)  F43.10      Plan:  Patient continues to show good motivation and very  active participation in sessions working on specific problem areas each session particularly, having healthier boundaries, not making negative assumptions about others in relation to herself, stress management, healing of her feelings, anxiety, trying to have more effective money management/choices, and working on having healthier mindset overall.  She is making progress and needs to continue her work with goal-directed behaviors to keep moving in a positive and more hopeful direction.  Reminded and encouraged patient to be using more positive/self affirming behaviors as noted in session including: Refrain from identifying herself as a "failure", use of more positive/encouraging self talk, remain in contact with supportive people, stay involved in activities that help her maintain stability in mood, refrain from getting overly fixated on the future, continue working on her strategies regarding thought distractions, live in the present more, emphasize her own positives, give herself a timeout or "reset" as needed, saying no when she needs to say no, practice "the pause" as needed, let her faith be a resource for emotional stability as well as spiritual, understand that her anxious thoughts are "just thoughts" and are not necessarily going to play out in reality the way she might assume, and realize the strength she shows working with goal-directed behaviors to move in a direction of improved emotional health and outlook into the future.  Goal review and progress/challenges noted with patient.  Next appointment within 2 weeks.   Mathis Fare, LCSW

## 2023-03-05 ENCOUNTER — Encounter: Payer: Self-pay | Admitting: Psychiatry

## 2023-03-05 ENCOUNTER — Ambulatory Visit (INDEPENDENT_AMBULATORY_CARE_PROVIDER_SITE_OTHER): Payer: HMO | Admitting: Psychiatry

## 2023-03-05 DIAGNOSIS — F4312 Post-traumatic stress disorder, chronic: Secondary | ICD-10-CM

## 2023-03-05 DIAGNOSIS — F515 Nightmare disorder: Secondary | ICD-10-CM | POA: Diagnosis not present

## 2023-03-05 DIAGNOSIS — F4310 Post-traumatic stress disorder, unspecified: Secondary | ICD-10-CM

## 2023-03-05 DIAGNOSIS — F5105 Insomnia due to other mental disorder: Secondary | ICD-10-CM

## 2023-03-05 DIAGNOSIS — F3162 Bipolar disorder, current episode mixed, moderate: Secondary | ICD-10-CM

## 2023-03-05 DIAGNOSIS — F4001 Agoraphobia with panic disorder: Secondary | ICD-10-CM | POA: Diagnosis not present

## 2023-03-05 MED ORDER — BUSPIRONE HCL 15 MG PO TABS: 15.0000 mg | ORAL_TABLET | Freq: Three times a day (TID) | ORAL | 1 refills | Status: DC
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
## 2023-03-15 ENCOUNTER — Ambulatory Visit: Payer: HMO | Admitting: Psychiatry

## 2023-03-15 DIAGNOSIS — F3162 Bipolar disorder, current episode mixed, moderate: Secondary | ICD-10-CM

## 2023-03-18 ENCOUNTER — Encounter: Payer: Self-pay | Admitting: Family Medicine

## 2023-03-18 ENCOUNTER — Ambulatory Visit (INDEPENDENT_AMBULATORY_CARE_PROVIDER_SITE_OTHER): Payer: HMO | Admitting: Family Medicine

## 2023-03-18 VITALS — BP 112/68 | HR 90 | Temp 97.6°F | Wt 174.0 lb

## 2023-03-18 DIAGNOSIS — R0989 Other specified symptoms and signs involving the circulatory and respiratory systems: Secondary | ICD-10-CM

## 2023-03-18 DIAGNOSIS — J40 Bronchitis, not specified as acute or chronic: Secondary | ICD-10-CM | POA: Diagnosis not present

## 2023-03-18 DIAGNOSIS — R509 Fever, unspecified: Secondary | ICD-10-CM | POA: Diagnosis not present

## 2023-03-18 LAB — POCT INFLUENZA A/B
Influenza A, POC: NEGATIVE
Influenza B, POC: NEGATIVE

## 2023-03-18 LAB — POC COVID19 BINAXNOW: SARS Coronavirus 2 Ag: NEGATIVE

## 2023-03-18 MED ORDER — METHYLPREDNISOLONE ACETATE 40 MG/ML IJ SUSP
40.0000 mg | Freq: Once | INTRAMUSCULAR | Status: AC
Start: 2023-03-18 — End: 2023-03-18
  Administered 2023-03-18: 40 mg via INTRAMUSCULAR

## 2023-03-18 MED ORDER — METHYLPREDNISOLONE ACETATE 80 MG/ML IJ SUSP
80.0000 mg | Freq: Once | INTRAMUSCULAR | Status: AC
Start: 2023-03-18 — End: 2023-03-18
  Administered 2023-03-18: 80 mg via INTRAMUSCULAR

## 2023-03-18 MED ORDER — AMOXICILLIN-POT CLAVULANATE 875-125 MG PO TABS: 1.0000 | ORAL_TABLET | Freq: Two times a day (BID) | ORAL | 0 refills | Status: DC

## 2023-03-18 NOTE — Progress Notes (Signed)
Subjective:    Patient ID: Brittany [name], female    DOB: Jan 19, 1956, 67 y.o.   MRN: [id number]  HPI Here for one week of chest congestion, SOB, and coughing up green sputum. No fever. Using her inhaler and her nebulizer.    Review of Systems  Constitutional: Negative.   HENT: Negative.    Eyes: Negative.   Respiratory:  Positive for cough, chest tightness and wheezing. Negative for shortness of breath.   Cardiovascular: Negative.        Objective:   Physical Exam Constitutional:      Appearance: Normal appearance. She is not ill-appearing.  HENT:     Right Ear: Tympanic membrane, ear canal and external ear normal.     Left Ear: Tympanic membrane, ear canal and external ear normal.     Nose: Nose normal.     Mouth/Throat:     Pharynx: Oropharynx is clear.  Eyes:     Conjunctiva/sclera: Conjunctivae normal.  Pulmonary:     Effort: Pulmonary effort is normal.     Breath sounds: Wheezing present. No rhonchi or rales.  Lymphadenopathy:     Cervical: No cervical adenopathy.  Neurological:     Mental Status: She is alert.           Assessment & Plan:  Bronchitis, treat with 10 days of Augmentin. Given a shot of DepoMedrol.  Gershon Crane, MD

## 2023-03-18 NOTE — Progress Notes (Signed)
A user error has taken place.

## 2023-03-18 NOTE — Addendum Note (Signed)
Addended by: Carola Rhine on: 03/18/2023 05:05 PM   Modules accepted: Orders

## 2023-03-27 ENCOUNTER — Other Ambulatory Visit: Payer: Self-pay | Admitting: Psychiatry

## 2023-03-27 DIAGNOSIS — F5105 Insomnia due to other mental disorder: Secondary | ICD-10-CM

## 2023-03-27 DIAGNOSIS — F4310 Post-traumatic stress disorder, unspecified: Secondary | ICD-10-CM

## 2023-03-27 DIAGNOSIS — F4001 Agoraphobia with panic disorder: Secondary | ICD-10-CM

## 2023-03-29 ENCOUNTER — Ambulatory Visit: Payer: HMO | Admitting: Psychiatry

## 2023-03-29 DIAGNOSIS — F4310 Post-traumatic stress disorder, unspecified: Secondary | ICD-10-CM

## 2023-03-29 NOTE — Progress Notes (Signed)
[facility] Counselor/Therapist Progress Note  Patient ID: SAMYIAH HALVORSEN, MRN: [id number],    Date: 03/29/2023  Time Spent: 55 minutes   Treatment Type: Individual Therapy  Reported Symptoms:  anxiety, some unhealthy decisions but getting better, loneliness at times, overthinking, overstressing, working with strategies per session, depression improving   Mental Status Exam:  Appearance:   Casual and Neat     Behavior:  Appropriate, Sharing, and Motivated  Motor:  Normal  Speech/Language:   Clear and Coherent  Affect:  Anxious, some depression  Mood:  anxious and depressed  Thought process:  goal directed  Thought content:    WNL  Sensory/Perceptual disturbances:    WNL  Orientation:  oriented to person, place, time/date, situation, day of week, month of year, year, and stated date of Oct. 11, 2024  Attention:  Fair  Concentration:  Fair  Memory:  WNL  Fund of knowledge:   Good  Insight:    Good and Fair  Judgment:   Good  Impulse Control:  Good   Risk Assessment: Danger to Self:  No Self-injurious Behavior: No Danger to Others: No Duty to Warn:no Physical Aggression / Violence:No  Access to Firearms a concern: No  Gang Involvement:No   Subjective:  Patient in today and reports her follow through on suggested strategies as discussed in prior sessions. Symptoms include anxiety, loneliness at times, some improvement in decision-making, overstressing, overthinking, and depression decreasing some. Has made progress in working with strategies discussed in sessions. Working today on being able to respond in healthier ways to stress, anger, frustration, and hurt with other people and in a variety of situations including her church, and has had several encounters with a women's group at the church that have been hurtful.  Has worked on responding in healthier ways when feeling hurt by others. Daughter diagnosed with breast cancer starting treatment soon. Continued  interpersonal issues with another lady at her apt complex. Letting go remains difficult and patient is putting forth more effort towards healthier changes. Encouraged to not assume the negatives nor worst case scenarios, and not recycle the negatives. Also encouraged continued behaviors and contacts that help patient feel more supported and encouraged emotionally.   Interventions: Cognitive Behavioral Therapy and Ego-Supportive  1) Long Term Goal: Develop and follow through on strategies to reduce symptoms.     Short Term Goal: Reduce anxiety and improve coping skills.     Objective: Learn two new ways of coping with daily stressors.     Objective: Develop strategies for thought distraction when fixating on the future.     Objective: Manage panic episodes. 2) Long Term Goal: Maintain stability of mood     Short Term Goal: Increase ability to manage moods.     Objective: Discuss and resolve troubling personal and interpersonal issues.   Diagnosis:   ICD-10-CM   1. PTSD (post-traumatic stress disorder)  F43.10      Plan: Patient actively participating in session as she continues her work on specific target areas each session and currently is focusing more on not making negative assumptions about others in relation to herself, having healthier boundaries, stress management, "healing of my feelings", anxiety, more effective money management/positive choices, aiming to have a healthier mindset.  Patient has definitely made some progress and needs to continue working with goal-directed behaviors to keep moving in a healthier and more hopeful direction. Reminded and encouraged patient to be using more positive/self affirming behaviors as noted in session including: Refrain  from identifying herself as a "failure", use of more positive/encouraging self talk, remain in contact with supportive people, stay involved in activities that help her maintain stability in mood, refrain from getting overly fixated on  the future, continue working on her strategies regarding thought distractions, live in the present more, emphasize her own positives, give herself a timeout or "reset" as needed, saying no when she needs to say no, practice "the pause" as needed, let her faith be a resource for emotional stability as well as spiritual, understand that her anxious thoughts are "just thoughts" and are not necessarily going to play out in reality the way she might assume, and recognize the strength she shows working with goal-directed behaviors to move in a direction of improved emotional health and overall wellbeing.  Goal review and progress/challenges noted with patient.  Next appointment within 2 weeks.  Mathis Fare, LCSW

## 2023-04-12 ENCOUNTER — Ambulatory Visit: Payer: HMO | Admitting: Psychiatry

## 2023-04-18 DIAGNOSIS — H25043 Posterior subcapsular polar age-related cataract, bilateral: Secondary | ICD-10-CM | POA: Diagnosis not present

## 2023-04-18 DIAGNOSIS — H2513 Age-related nuclear cataract, bilateral: Secondary | ICD-10-CM | POA: Diagnosis not present

## 2023-04-18 DIAGNOSIS — H02403 Unspecified ptosis of bilateral eyelids: Secondary | ICD-10-CM | POA: Diagnosis not present

## 2023-04-18 DIAGNOSIS — H16223 Keratoconjunctivitis sicca, not specified as Sjogren's, bilateral: Secondary | ICD-10-CM | POA: Diagnosis not present

## 2023-04-25 ENCOUNTER — Encounter: Payer: Self-pay | Admitting: Family Medicine

## 2023-04-25 ENCOUNTER — Ambulatory Visit (INDEPENDENT_AMBULATORY_CARE_PROVIDER_SITE_OTHER): Payer: HMO | Admitting: Family Medicine

## 2023-04-25 VITALS — BP 118/70 | HR 83 | Temp 97.7°F | Ht 61.0 in | Wt 176.6 lb

## 2023-04-25 DIAGNOSIS — Z780 Asymptomatic menopausal state: Secondary | ICD-10-CM | POA: Diagnosis not present

## 2023-04-25 DIAGNOSIS — K219 Gastro-esophageal reflux disease without esophagitis: Secondary | ICD-10-CM | POA: Diagnosis not present

## 2023-04-25 DIAGNOSIS — R5383 Other fatigue: Secondary | ICD-10-CM | POA: Diagnosis not present

## 2023-04-25 DIAGNOSIS — E782 Mixed hyperlipidemia: Secondary | ICD-10-CM

## 2023-04-25 DIAGNOSIS — Z1231 Encounter for screening mammogram for malignant neoplasm of breast: Secondary | ICD-10-CM

## 2023-04-25 NOTE — Progress Notes (Signed)
Established Patient Office Visit  Subjective   Patient ID: Brittany [name], female    DOB: 26-Sep-1955  Age: 67 y.o. MRN: [id number]  Chief Complaint  Patient presents with   Medical Management of Chronic Issues    Pt is here for follow up today.  She report she had a recent bout of bronchitis that has taken longer to heal than previous.   HLD-- pt reports she is tolerating repatha without much side effect. State she gets a little runny nose with it but otherwise no issues. She has a follow up with the cardiologist on 12/3 for a cholesterol check.  GERD -- pt is requesting a new referral to GI-- states she was sent to somewhere in [facility] but it is too far, used to see Dr. Orvan Falconer but she is no longer with the practice. She would like a female physician if possible.     Current Outpatient Medications  Medication Instructions   albuterol (PROVENTIL) 2.5 mg, Nebulization, Every 6 hours PRN   Albuterol Sulfate (PROAIR RESPICLICK) 108 (90 Base) MCG/ACT AEPB 2 puffs, Inhalation, Every 6 hours PRN   Azelastine HCl 137 MCG/SPRAY SOLN SPRAY 1 SPRAY IN EACH NOSTRIL TWICE DAILY AS NEEDED   busPIRone (BUSPAR) 15 mg, Oral, 3 times daily   cetirizine (ZYRTEC) 10 mg, Oral, Daily   Cholecalciferol (VITAMIN D-3 PO) 10,000 Units, Oral, Daily   clonazePAM (KLONOPIN) 0.5 MG tablet TAKE 1 TABLET BY MOUTH 2 TIMES A DAY AND 2 AT BEDTIME   dicyclomine (BENTYL) 20 MG tablet TAKE ONE TABLET BY MOUTH FOUR TIMES A DAY AS NEEDED FOR SPASMS   doxazosin (CARDURA) 8 mg, Oral, Nightly   ezetimibe (ZETIA) 10 mg, Oral, Daily   famotidine (PEPCID) 20 mg, Oral, 2 times daily   fluticasone (FLONASE) 50 MCG/ACT nasal spray 2 sprays, Each Nare, 2 times daily   fluticasone-salmeterol (ADVAIR) 250-50 MCG/ACT AEPB 1 puff, Inhalation, Every 12 hours   mirtazapine (REMERON) 45 mg, Oral, Daily at bedtime   montelukast (SINGULAIR) 10 mg, Oral, Daily at bedtime   Multiple Vitamin (MULTIVITAMIN WITH MINERALS) TABS tablet  1 tablet, Oral, Daily   pantoprazole (PROTONIX) 40 mg, Oral, Daily   Repatha SureClick 140 mg, Subcutaneous, Every 14 days   sodium chloride (OCEAN) 0.65 % SOLN nasal spray 1 spray, Each Nare, As needed    Patient Active Problem List   Diagnosis Date Noted   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   Healthy adult on routine physical examination 04/25/2022   Asthma exacerbation 04/09/2022   Small intestinal bacterial overgrowth (SIBO) 04/09/2022   Fatigue 01/08/2022   Morbid obesity (HCC) 01/08/2022   Pulmonary fibrosis (HCC) 04/06/2020   Dizziness 02/11/2020   Melena 04/22/2019   Suprapubic abdominal pain 04/22/2019   Osteopenia 12/04/2016   Chronic post-traumatic stress disorder (PTSD) 08/23/2016   Bipolar affective disorder, current episode depressed (HCC) 08/23/2016   Hyperlipidemia 08/23/2016   COPD / AB vs ACOS 06/22/2016   Cough variant asthma  vs UACS  06/21/2016   GERD 04/03/2007      Review of Systems  All other systems reviewed and are negative.     Objective:     BP 118/70 (BP Location: Left Arm, Patient Position: Sitting, Cuff Size: Large)   Pulse 83   Temp 97.7 F (36.5 C) (Oral)   Ht 5\' 1"  (1.549 m)   Wt 176 lb 9.6 oz (80.1 kg)   SpO2 96%   BMI 33.37 kg/m    Physical Exam Vitals reviewed.  Constitutional:  Appearance: Normal appearance. She is well-groomed. She is obese.  Cardiovascular:     Rate and Rhythm: Normal rate and regular rhythm.     Pulses: Normal pulses.     Heart sounds: S1 normal and S2 normal.  Pulmonary:     Effort: Pulmonary effort is normal.     Breath sounds: Normal breath sounds and air entry.  Musculoskeletal:     Right lower leg: No edema.     Left lower leg: No edema.  Neurological:     Mental Status: She is alert and oriented to person, place, and time. Mental status is at baseline.     Gait: Gait is intact.  Psychiatric:        Mood and Affect: Mood and affect normal.        Speech: Speech normal.        Behavior:  Behavior normal.      No results found for any visits on 04/25/23.    The 10-year ASCVD risk score (Arnett DK, et al., 2019) is: 6.2%    Assessment & Plan:  Mixed hyperlipidemia Assessment & Plan: Now on ezetimibe and repatha, no side effects reported, she goes back to the cardiologist in 1 month for lipid panel, checking CMP today  Orders: -     Comprehensive metabolic panel; Future  Other fatigue Assessment & Plan: Chronic, ongoing, will check CBC and A1C today  Orders: -     CBC; Future -     Hemoglobin A1c; Future  Gastroesophageal reflux disease, unspecified whether esophagitis present Assessment & Plan: Pt requesting a new referral to GI, will place orders, continue PPI as prescribed at 40 mg once daily  Orders: -     Ambulatory referral to Gastroenterology  Postmenopausal state -     DG Bone Density; Future  Breast cancer screening by mammogram -     3D Screening Mammogram, Left and Right; Future     Return in about 6 months (around 10/23/2023).    Karie Georges, MD

## 2023-04-25 NOTE — Assessment & Plan Note (Signed)
Now on ezetimibe and repatha, no side effects reported, she goes back to the cardiologist in 1 month for lipid panel, checking CMP today

## 2023-04-26 ENCOUNTER — Other Ambulatory Visit (INDEPENDENT_AMBULATORY_CARE_PROVIDER_SITE_OTHER): Payer: HMO

## 2023-04-26 ENCOUNTER — Ambulatory Visit: Payer: HMO | Admitting: Psychiatry

## 2023-04-26 DIAGNOSIS — E782 Mixed hyperlipidemia: Secondary | ICD-10-CM | POA: Diagnosis not present

## 2023-04-26 DIAGNOSIS — R5383 Other fatigue: Secondary | ICD-10-CM | POA: Diagnosis not present

## 2023-04-26 DIAGNOSIS — F3162 Bipolar disorder, current episode mixed, moderate: Secondary | ICD-10-CM

## 2023-04-26 LAB — COMPREHENSIVE METABOLIC PANEL
ALT: 14 U/L (ref 0–35)
AST: 16 U/L (ref 0–37)
Albumin: 4.5 g/dL (ref 3.5–5.2)
Alkaline Phosphatase: 58 U/L (ref 39–117)
BUN: 19 mg/dL (ref 6–23)
CO2: 28 meq/L (ref 19–32)
Calcium: 9.2 mg/dL (ref 8.4–10.5)
Chloride: 105 meq/L (ref 96–112)
Creatinine, Ser: 0.73 mg/dL (ref 0.40–1.20)
GFR: 84.93 mL/min (ref >60.00)
Glucose, Bld: 92 mg/dL (ref 70–99)
Potassium: 4.1 meq/L (ref 3.5–5.1)
Sodium: 139 meq/L (ref 135–145)
Total Bilirubin: 0.3 mg/dL (ref 0.2–1.2)
Total Protein: 6.9 g/dL (ref 6.0–8.3)

## 2023-04-26 LAB — CBC
HCT: 45.8 % (ref 36.0–46.0)
Hemoglobin: 15.5 g/dL — ABNORMAL HIGH (ref 12.0–15.0)
MCHC: 33.8 g/dL (ref 30.0–36.0)
MCV: 93.8 fL (ref 78.0–100.0)
Platelets: 338 10*3/uL (ref 150.0–400.0)
RBC: 4.89 Mil/uL (ref 3.87–5.11)
RDW: 13.6 % (ref 11.5–15.5)
WBC: 6.8 10*3/uL (ref 4.0–10.5)

## 2023-04-26 LAB — HEMOGLOBIN A1C: Hgb A1c MFr Bld: 5 % (ref 4.6–6.5)

## 2023-04-26 NOTE — Assessment & Plan Note (Signed)
Chronic, ongoing, will check CBC and A1C today

## 2023-04-26 NOTE — Progress Notes (Signed)
[facility] Counselor/Therapist Progress Note  Patient ID: Brittany [name], MRN: [id number],    Date: 04/26/2023  Time Spent:  53 minutes  Treatment Type: Individual Therapy  Reported Symptoms: anxiety,  working on unhealthy decisions, overthinking improved, loneliness, sadness, depression (improving some)   Mental Status Exam:  Appearance:   Casual     Behavior:  Appropriate, Sharing, and Motivated  Motor:  Normal (but currently using a brace to help with knee pain)  Speech/Language:   Clear and Coherent  Affect:  Depressed and anxiety  Mood:  anxious, depressed, and sad  Thought process:  normal  Thought content:    WNL  Sensory/Perceptual disturbances:    WNL  Orientation:  oriented to person, place, time/date, situation, day of week, month of year, year, and stated date of Nov. 8, 2024  Attention:  Good  Concentration:  Good  Memory:  WNL  Fund of knowledge:   Good  Insight:    Fair  Judgment:   Good and Fair  Impulse Control:  Good and Fair   Risk Assessment: Danger to Self:  No Self-injurious Behavior: No Danger to Others: No Duty to Warn:no Physical Aggression / Violence:No  Access to Firearms a concern: No  Gang Involvement:No   Subjective:   Patient in for session today and shares several ways in which she is trying to work on strategies discussed in session and apply them outside of sessions as she confronts her anxiety, difficulty and decision making, overthinking, over stressing, depression, and loneliness at times.  Is aware of some of her progress.  Works consistently in in sessions most of the time.  Continues to work particularly on anger, stress management, and frustration, as well as dealing with "hurt from others".  Has shown some changes and responding in healthier ways when hurt or angered, but is definitely needing to continue work on this.  Daughter with cancer and in treatment. Other lady at her apt complex, less conflict. Trying to make better  financial decisions. "Letting go" remains very difficult for patient. Working to interrupt thought patterns that are negative or represent "worst case scenarios" and refrain from over-focusing on the negatives. Encouraging patient to stay in contact with others that help her feel more connected emotionally and supported.  Interventions: Cognitive Behavioral Therapy and Ego-Supportive  1) Long Term Goal: Develop and follow through on strategies to reduce symptoms.     Short Term Goal: Reduce anxiety and improve coping skills.     Objective: Learn two new ways of coping with daily stressors.     Objective: Develop strategies for thought distraction when fixating on the future.     Objective: Manage panic episodes. 2) Long Term Goal: Maintain stability of mood     Short Term Goal: Increase ability to manage moods.     Objective: Discuss and resolve troubling personal and interpersonal issues.   Diagnosis:   ICD-10-CM   1. Bipolar 1 disorder, mixed, moderate (HCC)  F31.62      Plan:  Patient continuing her work today on several areas that we have targeted and that she is motivated to make changes including: Having healthier boundaries, stress management, "healing of my feelings", not making negative assumptions about others in relation to herself, anxiety, more effective money management/positive choices, aiming to have a healthier mindset.  She has made progress and needs to continue working with goal-directed behaviors to keep moving and a more hopeful and healthier direction.  Reminded and encouraged patient to  be using more positive and self affirming behaviors as noted in session including: Use of more positive/encouraging self talk, stay in contact with supportive people, remain involved in activities that nurture and help her maintain stability in mood, refrain from identifying herself as a "failure", refrain from getting overly fixated on the future, continue working on her strategies regarding  thought distractions, live in the present more, emphasize her own positives, give herself a timeout or "reset" as needed, saying no when she needs to say no, practicing "the pause" as needed, let her faith be a resource for emotional stability as well as spiritual, really tried to look more for the positives versus the negatives and refrain from assuming negatives, understand that her anxious thoughts are "just thoughts" and are not necessarily going to play out in reality the way she might assume, and realize the strength she shows working with goal-directed behaviors to move in a direction of improved emotional health and her outlook into the future.  Goal review and progress/challenges noted with patient.  Next appointment within 2 weeks.   Mathis Fare, LCSW

## 2023-04-26 NOTE — Assessment & Plan Note (Signed)
Pt requesting a new referral to GI, will place orders, continue PPI as prescribed at 40 mg once daily

## 2023-05-07 ENCOUNTER — Telehealth: Payer: Self-pay | Admitting: Internal Medicine

## 2023-05-07 NOTE — Telephone Encounter (Signed)
Hi, Dr. Leonides Schanz,     We received a received a referral for patient for GERD. Patient is also wishing to have a colonoscopy. Patient was previously a patient with Dr. Orvan Falconer but transferred her care to [facility]. Patient is wishing to transfer her care due to no longer having transportation to [facility]. Her previous records are in [facility] for you to review and advise on scheduling.    Thank you.

## 2023-05-09 ENCOUNTER — Ambulatory Visit: Payer: HMO | Admitting: Psychiatry

## 2023-05-09 DIAGNOSIS — F3162 Bipolar disorder, current episode mixed, moderate: Secondary | ICD-10-CM

## 2023-05-09 NOTE — Progress Notes (Signed)
[facility] Counselor/Therapist Progress Note  Patient ID: Brittany [name], MRN: [id number],    Date: 05/09/2023  Time Spent: 53 minutes   Treatment Type: Individual Therapy  Reported Symptoms: more loneliness, more sadness, anxiety but not as bad, depression, overthinking     Mental Status Exam:  Appearance:   Casual and Neat     Behavior:  Appropriate, Sharing, and Motivated  Motor:  Normal  Speech/Language:   Clear and Coherent  Affect:  Depressed  Mood:  anxious and depressed  Thought process:  goal directed  Thought content:    Rumination  Sensory/Perceptual disturbances:    WNL  Orientation:  oriented to person, place, time/date, situation, day of week, month of year, year, and stated date of Nov. 21, 2024  Attention:  Good  Concentration:  Good and Fair  Memory:  WNL  Fund of knowledge:   Good  Insight:    Good and Fair  Judgment:   Good and Fair  Impulse Control:  Good   Risk Assessment: Danger to Self:  No Self-injurious Behavior: No Danger to Others: No Duty to Warn:no Physical Aggression / Violence:No  Access to Firearms a concern: No  Gang Involvement:No   Subjective:  Patient in today sharing her need to talk, "feeling alone, feeling angry and resentful towards others", feeling anxious, depressed and negative.  Overthinking, irritable, feels hurt by others, difficulty with decisions to make, overstressing, and tends to personalize things from others. Hard to sometimes consider positive options, and holds onto negatives. Today worked more on negative thought patterns and trying to consider some positive thought patterns. Working further on anger, frustration, stress management, and trying to better manage "hurt from others." Daughter still in cancer treatment. Working to make better financial decisions. Conflict reduced at her apt complex after patient spoke up and set boundaries. Daughter still in cancer treatment. Working to make improved Engineer, technical sales.  Finds it hard to "let go", interrupt negative thoughts at times, and work to stop thinking "worst case scenarios". Working to help her feel more supported and emotionally connected.   Interventions: Cognitive Behavioral Therapy and Ego-Supportive  1) Long Term Goal: Develop and follow through on strategies to reduce symptoms.     Short Term Goal: Reduce anxiety and improve coping skills.     Objective: Learn two new ways of coping with daily stressors.     Objective: Develop strategies for thought distraction when fixating on the future.     Objective: Manage panic episodes. 2) Long Term Goal: Maintain stability of mood     Short Term Goal: Increase ability to manage moods.     Objective: Discuss and resolve troubling personal and interpersonal issues.   Diagnosis:   ICD-10-CM   1. Bipolar 1 disorder, mixed, moderate (HCC)  F31.62      Plan:  Patient today working further on her anxiety, depression, negativity, better boundaries, stress management, looking for more positives, better choice-making, and trying to have a healthier mindset.  States he remains motivated to make changes and wants to "heal bad feelings".  To continue working on not making negative assumptions about others nor herself, trying to have a healthier mindset, good money management, use of more positive/encouraging self talk, remain involved in activities that nurture and help her to maintain stability in mood, refrain from identifying herself as a failure, refrain from getting overly fixated on the future, continue working with strategies regarding thought distractions, stay in the present versus the past or  the future, emphasize her own positives, practice "the pause" as needed, let her faith be a resource for emotional stability as well as spiritual, look for more positives versus negatives, understand that anxious thoughts are "just thoughts" and are not necessarily going to play out in reality the way she might  assume, and recognize the strength she can show when working with goal-directed behaviors to move in a direction of improved emotional health and overall wellbeing.  Goal review and progress/challenges noted with patient.  Next appointment within 2 weeks.   Mathis Fare, LCSW

## 2023-05-10 ENCOUNTER — Ambulatory Visit: Payer: HMO | Admitting: Psychiatry

## 2023-05-13 ENCOUNTER — Encounter: Payer: Self-pay | Admitting: Physician Assistant

## 2023-05-17 ENCOUNTER — Other Ambulatory Visit: Payer: Self-pay | Admitting: Psychiatry

## 2023-05-17 DIAGNOSIS — F515 Nightmare disorder: Secondary | ICD-10-CM

## 2023-05-21 ENCOUNTER — Telehealth: Payer: Self-pay | Admitting: Pulmonary Disease

## 2023-05-21 ENCOUNTER — Ambulatory Visit: Payer: HMO

## 2023-05-21 DIAGNOSIS — J4540 Moderate persistent asthma, uncomplicated: Secondary | ICD-10-CM

## 2023-05-21 DIAGNOSIS — E785 Hyperlipidemia, unspecified: Secondary | ICD-10-CM

## 2023-05-21 NOTE — Telephone Encounter (Signed)
Patient states needs refill for Advair inhaler and Albuterol inhaler. Pharmacy is [facility] [place]. Patient phone number is [phone].

## 2023-05-22 ENCOUNTER — Ambulatory Visit: Payer: HMO | Attending: Cardiovascular Disease

## 2023-05-22 DIAGNOSIS — E785 Hyperlipidemia, unspecified: Secondary | ICD-10-CM | POA: Diagnosis not present

## 2023-05-23 ENCOUNTER — Encounter: Payer: Self-pay | Admitting: Cardiovascular Disease

## 2023-05-23 DIAGNOSIS — E785 Hyperlipidemia, unspecified: Secondary | ICD-10-CM

## 2023-05-23 DIAGNOSIS — Z79899 Other long term (current) drug therapy: Secondary | ICD-10-CM

## 2023-05-23 LAB — HEPATIC FUNCTION PANEL
ALT: 16 [IU]/L (ref 0–32)
AST: 13 [IU]/L (ref 0–40)
Albumin: 4.4 g/dL (ref 3.9–4.9)
Alkaline Phosphatase: 61 [IU]/L (ref 44–121)
Bilirubin Total: 0.3 mg/dL (ref 0.0–1.2)
Bilirubin, Direct: 0.12 mg/dL (ref 0.00–0.40)
Total Protein: 6.3 g/dL (ref 6.0–8.5)

## 2023-05-24 ENCOUNTER — Ambulatory Visit: Payer: HMO | Admitting: Psychiatry

## 2023-05-24 DIAGNOSIS — F3162 Bipolar disorder, current episode mixed, moderate: Secondary | ICD-10-CM

## 2023-05-24 NOTE — Progress Notes (Signed)
[facility] Counselor/Therapist Progress Note  Patient ID: Brittany [name], MRN: [id number],    Date: 05/24/2023  Time Spent: 53 minutes   Treatment Type: Individual Therapy  Reported Symptoms: anxiety, depression, overthinking, some loneliness    Mental Status Exam:  Appearance:   Casual     Behavior:  Appropriate, Sharing, and Motivated  Motor:  Normal  Speech/Language:   Clear and Coherent  Affect:  Depressed and anxious  Mood:  anxious, depressed, and sad  Thought process:  goal directed  Thought content:    Rumination  Sensory/Perceptual disturbances:    WNL  Orientation:  oriented to person, place, time/date, situation, day of week, month of year, year, and stated date of Dec. 6, 2024  Attention:  Fair  Concentration:  Good and Fair  Memory:  WNL  Fund of knowledge:   Good  Insight:    Fair  Judgment:   Good and Fair  Impulse Control:  Good   Risk Assessment: Danger to Self:  No Self-injurious Behavior: No Danger to Others: No Duty to Warn:no Physical Aggression / Violence:No  Access to Firearms a concern: No  Gang Involvement:No   Subjective:   Patient in session today reporting anxiety, depression, overthinking, and loneliness. Has been working more on her anxiety, overthinking, easily hurt by others especially her son who she feels she should have more time with. Sadness re: feeling adults and  kids in family don't offer her much time with them. "Feeling alone or ignored at times, but trying not to dwell on it as much."  Talked at length about feeling alone or forgotten at times often by other family. Daughter with cancer going through her tx and to eventually have mastectomy afterwards. Working on "her need to talk "and paying attention to trying to make it less forced, and rather let it unfold without pushing it with others, especially certain family members. Working more on trying to not jump to negative assumptions which always feeds her depression and  anxiety.  Some less difficulty and making certain decisions, working not to overthink and over stress is much.  More awareness and more effort in trying not to over-personalize things in relationships.  Trying to hold onto more positive thought patterns.  The previous conflict at her apartment complex has remained reduced over the last few weeks and patient continues to use healthier boundaries.  Trying to make healthier financial decisions and refrain from worst-case scenarios.  Interventions: Cognitive Behavioral Therapy and Ego-Supportive 1) Long Term Goal: Develop and follow through on strategies to reduce symptoms.     Short Term Goal: Reduce anxiety and improve coping skills.     Objective: Learn two new ways of coping with daily stressors.     Objective: Develop strategies for thought distraction when fixating on the future.     Objective: Manage panic episodes. 2) Long Term Goal: Maintain stability of mood     Short Term Goal: Increase ability to manage moods.     Objective: Discuss and resolve troubling personal and interpersonal issues.   Diagnosis:   ICD-10-CM   1. Bipolar 1 disorder, mixed, moderate (HCC)  F31.62      Plan: Patient in session today as she worked further on her depression, anxiety, trying to make better choices, trying to look for more positives versus negatives, trying not to jump to conclusions, trying to look less for what might go wrong versus right and have an overall healthier mindset.  She is showing some  progress and needs to continue her work on goal-directed behaviors in order to move in a more hopeful and healthier direction.  Supported patient in her continuing to work on not making negative assumptions about others no herself, trying to have a Sport and exercise psychologist, good Tree surgeon, use of more positive/encouraging self talk, remain involved in activities that nurture and help her to maintain stability in mood, refrain from identifying herself as a failure,  refrain from getting overly fixated on the future, continue working with strategies regarding thought distractions, stay in the present versus the past with the future, emphasize her own positives, practice "the pause" as needed, let her faith be a resource for emotional stability as well as spiritual, understand that her anxious thoughts are "just thoughts" and are not necessarily going to play out in reality the way she might assume, and realize the strength she shows when working with goal-directed behaviors to move in a direction of improved emotional health and more hope and her outlook at the future.  Goal review and progress/challenges noted with patient.  Next appointment within 2 weeks.   Mathis Fare, LCSW

## 2023-05-27 MED ORDER — FLUTICASONE-SALMETEROL 250-50 MCG/ACT IN AEPB: 1.0000 | INHALATION_SPRAY | Freq: Two times a day (BID) | RESPIRATORY_TRACT | 1 refills | Status: DC

## 2023-05-27 MED ORDER — PROAIR RESPICLICK 108 (90 BASE) MCG/ACT IN AEPB: 2.0000 | INHALATION_SPRAY | Freq: Four times a day (QID) | RESPIRATORY_TRACT | 5 refills | Status: AC | PRN

## 2023-05-27 NOTE — Telephone Encounter (Signed)
Refill sent per pt request. Pt notified.

## 2023-05-27 NOTE — Telephone Encounter (Signed)
Called patient, when she is feeling better, patient will go to [facility] and get lipid panel done. Right now patient has a cold.

## 2023-05-30 ENCOUNTER — Ambulatory Visit (INDEPENDENT_AMBULATORY_CARE_PROVIDER_SITE_OTHER): Payer: HMO | Admitting: Family Medicine

## 2023-05-30 DIAGNOSIS — Z0000 Encounter for general adult medical examination without abnormal findings: Secondary | ICD-10-CM | POA: Diagnosis not present

## 2023-05-30 NOTE — Patient Instructions (Signed)
I really enjoyed getting to talk with you today! I am available on Tuesdays and Thursdays for virtual visits if you have any questions or concerns, or if I can be of any further assistance.   CHECKLIST FROM ANNUAL WELLNESS VISIT:  -Follow up (please call to schedule if not scheduled after visit):   -yearly for annual wellness visit with primary care office  Here is a list of your preventive care/health maintenance measures and the plan for each if any are due:  PLAN For any measures below that may be due:   Health Maintenance  Topic Date Due   Colonoscopy  04/10/2023   COVID-19 Vaccine (7 - 2024-25 season) 04/24/2023   MAMMOGRAM  09/14/2023   DEXA SCAN  09/14/2023   Medicare Annual Wellness (AWV)  05/29/2024   DTaP/Tdap/Td (2 - Td or Tdap) 12/05/2026   Pneumonia Vaccine 35+ Years old  Completed   INFLUENZA VACCINE  Completed   Hepatitis C Screening  Completed   Zoster Vaccines- Shingrix  Completed   HPV VACCINES  Aged Out    -See a dentist at least yearly  -Get your eyes checked and then per your eye specialist's recommendations  -Other issues addressed today:   -I have included below further information regarding a healthy whole foods based diet, physical activity guidelines for adults, stress management and opportunities for social connections. I hope you find this information useful.   -----------------------------------------------------------------------------------------------------------------------------------------------------------------------------------------------------------------------------------------------------------  NUTRITION: -eat real food: lots of colorful vegetables (half the plate) and fruits -5-7 servings of vegetables and fruits per day (fresh or steamed is best), exp. 2 servings of vegetables with lunch and dinner and 2 servings of fruit per day. Berries and greens such as kale and collards are great choices.  -consume on a regular basis: whole  grains (make sure first ingredient on label contains the word "whole"), fresh fruits, fish, nuts, seeds, healthy oils (such as olive oil, avocado oil, grape seed oil) -may eat small amounts of dairy and lean meat on occasion, but avoid processed meats such as ham, bacon, lunch meat, etc. -drink water -try to avoid fast food and pre-packaged foods, processed meat -most experts advise limiting sodium to < 2300mg  per day, should limit further is any chronic conditions such as high blood pressure, heart disease, diabetes, etc. The American Heart Association advised that < 1500mg  is is ideal -try to avoid foods that contain any ingredients with names you do not recognize  -try to avoid sugar/sweets (except for the natural sugar that occurs in fresh fruit) -try to avoid sweet drinks -try to avoid white rice, white bread, pasta (unless whole grain), white or yellow potatoes  EXERCISE GUIDELINES FOR ADULTS: -if you wish to increase your physical activity, do so gradually and with the approval of your doctor -STOP and seek medical care immediately if you have any chest pain, chest discomfort or trouble breathing when starting or increasing exercise  -move and stretch your body, legs, feet and arms when sitting for long periods -Physical activity guidelines for optimal health in adults: -least 150 minutes per week of aerobic exercise (can talk, but not sing) once approved by your doctor, 20-30 minutes of sustained activity or two 10 minute episodes of sustained activity every day.  -resistance training at least 2 days per week if approved by your doctor -balance exercises 3+ days per week:   Stand somewhere where you have something sturdy to hold onto if you lose balance.    1) lift up on toes, start with  5x per day and work up to 20x   2) stand and lift on leg straight out to the side so that foot is a few inches of the floor, start with 5x each side and work up to 20x each side   3) stand on one foot,  start with 5 seconds each side and work up to 20 seconds on each side  If you need ideas or help with getting more active:  -Silver sneakers https://tools.silversneakers.com  -Walk with a Doc: http://www.duncan-williams.com/  -try to include resistance (weight lifting/strength building) and balance exercises twice per week: or the following link for ideas: http://castillo-powell.com/  BuyDucts.dk  STRESS MANAGEMENT: -can try meditating, or just sitting quietly with deep breathing while intentionally relaxing all parts of your body for 5 minutes daily -if you need further help with stress, anxiety or depression please follow up with your primary doctor or contact the wonderful folks at [facility]: [phone]  SOCIAL CONNECTIONS: -options in [place] if you wish to engage in more social and exercise related activities:  -Silver sneakers https://tools.silversneakers.com  -Walk with a Doc: http://www.duncan-williams.com/  -Check out the [facility] Active Adults 50+ section on the [employer] of [employer] (hiking clubs, book clubs, cards and games, chess, exercise classes, aquatic classes and much more) - see the website for details: https://www.[place].gov/departments/parks-recreation/active-adults50  -YouTube has lots of exercise videos for different ages and abilities as well  -[facility] (a variety of indoor and outdoor inperson activities for adults). [phone]. [address]  -Virtual Online Classes (a variety of topics): see seniorplanet.org or call [phone]  -consider volunteering at a school, hospice center, church, senior center or elsewhere

## 2023-05-30 NOTE — Progress Notes (Signed)
PATIENT CHECK-IN and HEALTH RISK ASSESSMENT QUESTIONNAIRE:  -completed by phone/video for upcoming Medicare Preventive Visit  -PLEASE SELECT "NOT IN PERSON" for the method of visit.   Pre-Visit Check-in: 1)Vitals (height, wt, BP, etc) - record in vitals section for visit on day of visit Request home vitals (wt, BP, etc.) and enter into vitals, THEN update Vital Signs SmartPhrase below at the top of the HPI. See below.  2)Review and Update Medications, Allergies PMH, Surgeries, Social history in Epic 3)Hospitalizations in the last year with date/reason? no  4)Review and Update Care Team (patient's specialists) in Epic 5) Complete PHQ9 in Epic  6) Complete Fall Screening in Epic 7)Review all Health Maintenance Due and order under PCP if not done.  Medicare Wellness Patient Questionnaire:  Answer theses question about your habits: How often do you have a drink containing alcohol? 1x per 6 month How many drinks containing alcohol do you have on a typical day when you are drinking? 1 How often do you have six or more drinks on one occasion?never Have you ever smoked?quit over 20 years ago Do you use an illicit drugs? no On average, how many days per week do you engage in moderate to strenuous exercise (like a brisk walk)?  None - plans to get back into it, stopped with head cold - was walking 3 miles per day On average, how many minutes do you engage in exercise at this level? 20-40 minutes Working on diet, she eliminated dairy, added sugars and is trying to eat less processed foods  Answer theses question about your everyday activities: Can you perform most household chores? yes Are you deaf or have significant trouble hearing? no Do you feel that you have a problem with memory?no, usual for her Do you feel safe at home? yes Last dentist visit? Needs to schedule 8. Do you have any difficulty performing your everyday activities? no Are you having any difficulty walking, taking  medications on your own, and or difficulty managing daily home needs?no Do you have difficulty walking or climbing stairs?no Do you have difficulty dressing or bathing?no Do you have difficulty doing errands alone such as visiting a doctor's office or shopping?no Do you currently have any difficulty preparing food and eating?no Do you currently have any difficulty using the toilet?no Do you have any difficulty managing your finances?no Do you have any difficulties with housekeeping of managing your housekeeping?no   Do you have Advanced Directives in place (Living Will, Healthcare Power or Attorney)?  yes   Last eye Exam and location? Reports just went to eye doc   Do you currently use prescribed or non-prescribed narcotic or opioid pain medications?no   ----------------------------------------------------------------------------------------------------------------------------------------------------------------------------------------------------------------------  Because this visit was a virtual/telehealth visit, some criteria may be missing or patient reported. Any vitals not documented were not able to be obtained and vitals that have been documented are patient reported.    MEDICARE ANNUAL PREVENTIVE VISIT WITH PROVIDER: (Welcome to Medicare, initial annual wellness or annual wellness exam)  Virtual Visit via Video Note  I connected with ENCARNACION BURRI on 05/30/23 by a video enabled telemedicine application and verified that I am speaking with the correct person using two identifiers.  Location patient: home Location provider:work or home office Persons participating in the virtual visit: patient, provider  Concerns and/or follow up today: reports all is stable, sees therapist on a regular basis. Has head cold the last few days but it is improving. Wears knee brace currently - waiting on knee surgery.  Daughter is battling breast cancer.    See HM section in Epic for other  details of completed HM.    ROS: negative for report of fevers, unintentional weight loss, vision changes, vision loss, hearing loss or change, chest pain,  sob changed from baseline, hemoptysis, melena, hematochezia, hematuria, falls, bleeding or bruising, thoughts of suicide or self harm, memory loss  Patient-completed extensive health risk assessment - reviewed and discussed with the patient: See Health Risk Assessment completed with patient prior to the visit either above or in recent phone note. This was reviewed in detailed with the patient today and appropriate recommendations, orders and referrals were placed as needed per Summary below and patient instructions.   Review of Medical History: -PMH, PSH, Family History and current specialty and care providers reviewed and updated and listed below   Patient Care Team: Karie Georges, MD as PCP - General (Family Medicine) Wendall Stade, MD as PCP - Cardiology (Cardiology)   Past Medical History:  Diagnosis Date   Abnormal EKG    Allergy    Anxiety    Asthma    Bipolar depression (HCC), Anxiety, PTSD, Panic disorder    -managed by [facility]   Depression    Foot pain    GERD (gastroesophageal reflux disease)    Heart murmur    as a child   Insomnia    Leg swelling    Osteoporosis    osteopenia   Pneumonia due to COVID-19 virus 04/22/2019   Tachycardia     Past Surgical History:  Procedure Laterality Date   BREAST BIOPSY Right    biopsy of nipple   CARPAL TUNNEL RELEASE Left    CESAREAN SECTION     x 2   CHOLECYSTECTOMY     COLONOSCOPY     ENDOMETRIAL ABLATION     EXCISION MORTON'S NEUROMA Left 01/11/2022   Procedure: SECOND WEBSPACE MORTON'S NEUROMA EXCISION;  Surgeon: Toni Arthurs, MD;  Location: [facility];  Service: Orthopedics;  Laterality: Left;   FOOT SURGERY Left    HAMMER TOE SURGERY Left 01/11/2022   Procedure: COLLATERAL LIGAMENT REPAIRS WITH SECOND AND THIRD HAMMER TOE  CORRECTION;  Surgeon: Toni Arthurs, MD;  Location: [facility];  Service: Orthopedics;  Laterality: Left;   KNEE SURGERY Right    reconstruction for patellar dislocation   LAPAROSCOPY     x 2   TOENAIL EXCISION Left 01/11/2022   Procedure: HALLUX TOENAIL PERMANENT EXCISION;  Surgeon: Toni Arthurs, MD;  Location: [facility];  Service: Orthopedics;  Laterality: Left;   TUBAL LIGATION     ULNAR NERVE REPAIR Left    UPPER GASTROINTESTINAL ENDOSCOPY     WEIL OSTEOTOMY Left 01/11/2022   Procedure: SECOND, THIRD AND FOURTH WEIL OSTEOTOMIES;  Surgeon: Toni Arthurs, MD;  Location: [facility];  Service: Orthopedics;  Laterality: Left;    Social History   Socioeconomic History   Marital status: Single    Spouse name: Not on file   Number of children: 3   Years of education: Not on file   Highest education level: Associate degree: occupational, Scientist, product/process development, or vocational program  Occupational History   Occupation: disabled  Tobacco Use   Smoking status: Former    Current packs/day: 0.00    Average packs/day: 20.0 packs/day for 1 year (20.0 ttl pk-yrs)    Types: Cigarettes    Start date: 06/18/1998    Quit date: 06/19/1999    Years since quitting: 23.9  Smokeless tobacco: Never   Tobacco comments:    20 year estimate but probably less   Vaping Use   Vaping status: Never Used  Substance and Sexual Activity   Alcohol use: Yes    Alcohol/week: 0.0 standard drinks of alcohol    Comment: occ   Drug use: No   Sexual activity: Never  Other Topics Concern   Not on file  Social History Narrative   Work or School: Disabled seconndary to psychiatric conditions      Home Situation: lives alone with 2 cats and one dog      Spiritual Beliefs: Christian      Lifestyle: no regular exercise, diet  Not great - wants to embark on healthier lifestyle   Social Drivers of Health   Financial Resource Strain: High Risk (04/24/2023)   Overall Financial  Resource Strain (CARDIA)    Difficulty of Paying Living Expenses: Hard  Food Insecurity: Food Insecurity Present (04/24/2023)   Hunger Vital Sign    Worried About Running Out of Food in the Last Year: Often true    Ran Out of Food in the Last Year: Often true  Transportation Needs: Unmet Transportation Needs (04/24/2023)   PRAPARE - Administrator, Civil Service (Medical): Yes    Lack of Transportation (Non-Medical): Yes  Physical Activity: Sufficiently Active (04/24/2023)   Exercise Vital Sign    Days of Exercise per Week: 5 days    Minutes of Exercise per Session: 30 min  Stress: Stress Concern Present (04/24/2023)   [employer] of Occupational Health - Occupational Stress Questionnaire    Feeling of Stress : To some extent  Social Connections: Moderately Isolated (04/24/2023)   Social Connection and Isolation Panel [NHANES]    Frequency of Communication with Friends and Family: Once a week    Frequency of Social Gatherings with Friends and Family: Once a week    Attends Religious Services: More than 4 times per year    Active Member of [employer] or Organizations: No    Attends Engineer, structural: More than 4 times per year    Marital Status: Divorced  Catering manager Violence: Not At Risk (05/24/2022)   Humiliation, Afraid, Rape, and Kick questionnaire    Fear of Current or Ex-Partner: No    Emotionally Abused: No    Physically Abused: No    Sexually Abused: No    Family History  Problem Relation Age of Onset   Heart failure Mother    Hypertension Mother    Hyperlipidemia Mother    Congestive Heart Failure Mother    Other Father        killed   COPD Paternal Aunt        a lot of aunts and uncle COPD or Emphysema   Emphysema Paternal Uncle    Colon polyps Maternal Aunt    COPD Brother    Heart disease Brother    Pulmonary embolism Brother    Breast cancer Neg Hx    Colon cancer Neg Hx    Esophageal cancer Neg Hx    Pancreatic cancer Neg Hx     Stomach cancer Neg Hx    Liver disease Neg Hx    Rectal cancer Neg Hx     Current Outpatient Medications on File Prior to Visit  Medication Sig Dispense Refill   albuterol (PROVENTIL) (2.5 MG/3ML) 0.083% nebulizer solution Take 3 mLs (2.5 mg total) by nebulization every 6 (six) hours as needed for wheezing or shortness of  breath. 75 mL 12   Albuterol Sulfate (PROAIR RESPICLICK) 108 (90 Base) MCG/ACT AEPB Inhale 2 puffs into the lungs every 6 (six) hours as needed. 1 each 5   Azelastine HCl 137 MCG/SPRAY SOLN SPRAY 1 SPRAY IN EACH NOSTRIL TWICE DAILY AS NEEDED 60 mL 1   busPIRone (BUSPAR) 15 MG tablet Take 1 tablet (15 mg total) by mouth 3 (three) times daily. 300 tablet 1   cetirizine (ZYRTEC) 10 MG tablet Take 1 tablet (10 mg total) by mouth daily. 90 tablet 1   Cholecalciferol (VITAMIN D-3 PO) Take 10,000 Units by mouth daily.      clonazePAM (KLONOPIN) 0.5 MG tablet TAKE 1 TABLET BY MOUTH 2 TIMES A DAY AND 2 AT BEDTIME 400 tablet 0   dicyclomine (BENTYL) 20 MG tablet TAKE ONE TABLET BY MOUTH FOUR TIMES A DAY AS NEEDED FOR SPASMS 360 tablet 3   doxazosin (CARDURA) 4 MG tablet TAKE TWO TABLETS BY MOUTH AT BEDTIME 60 tablet 0   Evolocumab (REPATHA SURECLICK) 140 MG/ML SOAJ Inject 140 mg into the skin every 14 (fourteen) days. 6 mL 3   ezetimibe (ZETIA) 10 MG tablet Take 1 tablet (10 mg total) by mouth daily. 90 tablet 3   famotidine (PEPCID) 20 MG tablet Take 1 tablet (20 mg total) by mouth 2 (two) times daily. (Patient taking differently: Take 20 mg by mouth as needed.) 180 tablet 3   fluticasone (FLONASE) 50 MCG/ACT nasal spray Place 2 sprays into both nostrils in the morning and at bedtime. 96 mL 2   fluticasone-salmeterol (ADVAIR) 250-50 MCG/ACT AEPB Inhale 1 puff into the lungs every 12 (twelve) hours. 180 each 1   mirtazapine (REMERON) 45 MG tablet Take 1 tablet (45 mg total) by mouth at bedtime. 100 tablet 1   montelukast (SINGULAIR) 10 MG tablet Take 1 tablet (10 mg total) by mouth at  bedtime. 100 tablet 2   Multiple Vitamin (MULTIVITAMIN WITH MINERALS) TABS tablet Take 1 tablet by mouth daily.     pantoprazole (PROTONIX) 40 MG tablet Take 1 tablet (40 mg total) by mouth daily. 90 tablet 3   sodium chloride (OCEAN) 0.65 % SOLN nasal spray Place 1 spray into both nostrils as needed for congestion.     No current facility-administered medications on file prior to visit.    Allergies  Allergen Reactions   Aspirin Other (See Comments)    discomfort   Atorvastatin     Patient states she felt suicidal   Codeine Itching   Crestor [Rosuvastatin]     Dizzy, leg pain and states she felt suicidal   Gabapentin    Niacin And Related Itching   Sulfa Antibiotics     Other reaction(s): Unknown   Sulfamethoxazole-Trimethoprim Other (See Comments)    Unknown reaction per pt   Topiramate Other (See Comments)    Per patient, it causes stomach burning   Trileptal [Oxcarbazepine]     itching   Trimethoprim Other (See Comments)    Unknown, per pt   Ultram [Tramadol] Itching   Vioxx [Rofecoxib]     Unknown per pt       Physical Exam Vitals requested from patient and listed below if patient had equipment and was able to obtain at home for this virtual visit: There were no vitals filed for this visit. Estimated body mass index is 33.37 kg/m as calculated from the following:   Height as of 04/25/23: 5\' 1"  (1.549 m).   Weight as of 04/25/23: 176 lb 9.6 oz (80.1  kg).  EKG (optional): deferred due to virtual visit  GENERAL: alert, oriented, no acute distress detected, full vision exam deferred due to pandemic and/or virtual encounter  HEENT: atraumatic, conjunttiva clear, no obvious abnormalities on inspection of external nose and ears  NECK: normal movements of the head and neck  LUNGS: on inspection no signs of respiratory distress, breathing rate appears normal, no obvious gross SOB, gasping or wheezing  CV: no obvious cyanosis  MS: moves all visible extremities without  noticeable abnormality  PSYCH/NEURO: pleasant and cooperative, no obvious depression or anxiety, speech and thought processing grossly intact, Cognitive function grossly intact  Flowsheet Row Office Visit from [facility]/25/2023 in [facility] [place]  PHQ-9 Total Score 9           04/25/2023    9:22 AM 03/18/2023    2:21 PM 10/23/2022    8:53 AM 05/24/2022    2:53 PM 05/24/2022    2:51 PM  Depression screen PHQ 2/9  Decreased Interest 1 1 1 1  0  Down, Depressed, Hopeless 1 1 1 3  0  PHQ - 2 Score 2 2 2 4  0  Altered sleeping 0 1 1 0 0  Tired, decreased energy 3 1 3  0 0  Change in appetite 1 2 1  0 0  Feeling bad or failure about yourself  3 2 2 1  0  Trouble concentrating 0 1 2 0 0  Moving slowly or fidgety/restless 0 0 1 0 0  Suicidal thoughts 0 1 0 0 0  PHQ-9 Score 9 10 12 5  0  Difficult doing work/chores   Somewhat difficult Somewhat difficult   Sees psychiatry and therapist for management of depression - feels unmotivated, but not really depressed per report. This time of year is worse for her in terms of motivation.      10/22/2022    4:08 PM 03/18/2023    2:21 PM 04/25/2023    9:21 AM 05/30/2023    9:28 AM 05/30/2023   10:20 AM  Fall Risk  Falls in the past year? 0  0 1 0  1  Was there an injury with Fall?  0 1 1  1   Fall Risk Category Calculator  0 2 1  2   Patient at Risk for Falls Due to  No Fall Risks Impaired balance/gait  History of fall(s)  Fall risk Follow up  Falls evaluation completed Falls evaluation completed  Falls prevention discussed     Patient-reported     SUMMARY AND PLAN:  Encounter for Medicare annual wellness exam   Discussed applicable health maintenance/preventive health measures and advised and referred or ordered per patient preferences: -she is currently set to meet her new gastroenterologist soon and then will get colonoscopy -had her updated covid vaccine in September  Health Maintenance  Topic Date Due   Colonoscopy   04/10/2023   COVID-19 Vaccine (7 - 2024-25 season) 04/24/2023   MAMMOGRAM  09/14/2023   DEXA SCAN  09/14/2023   Medicare Annual Wellness (AWV)  05/29/2024   DTaP/Tdap/Td (2 - Td or Tdap) 12/05/2026   Pneumonia Vaccine 4+ Years old  Completed   INFLUENZA VACCINE  Completed   Hepatitis C Screening  Completed   Zoster Vaccines- Shingrix  Completed   HPV VACCINES  Aged [employer] and counseling on the following was provided based on the above review of health and a plan/checklist for the patient, along with additional information discussed, was provided for the patient in  the patient instructions :  -Provided counseling and plan for increased risk of falling if applicable per above screening. Reviewed and discussed safe balance exercises that can be done at home to improve balance and discussed exercise guidelines for adults with include balance exercises at least 3 days per week.  -Advised and counseled on a healthy lifestyle - including the importance of a healthy diet, regular physical activity, stress management - seeing psych and therapist -Reviewed patient's current diet. Advised and counseled on a whole foods based healthy diet. A summary of a healthy diet was provided in the Patient Instructions. She may need to make adjustments slowly due to her bowel issues.  -reviewed patient's current physical activity level and discussed exercise guidelines for adults. Discussed community resources and ideas for safe exercise at home to assist in meeting exercise guideline recommendations in a safe and healthy way.  -Advise yearly dental visits at minimum and regular eye exams -Advised and counseled on alcohol safe limits, risks  Follow up: see patient instructions     Patient Instructions  I really enjoyed getting to talk with you today! I am available on Tuesdays and Thursdays for virtual visits if you have any questions or concerns, or if I can be of any further assistance.    CHECKLIST FROM ANNUAL WELLNESS VISIT:  -Follow up (please call to schedule if not scheduled after visit):   -yearly for annual wellness visit with primary care office  Here is a list of your preventive care/health maintenance measures and the plan for each if any are due:  PLAN For any measures below that may be due:   Health Maintenance  Topic Date Due   Colonoscopy  04/10/2023   COVID-19 Vaccine (7 - 2024-25 season) 04/24/2023   MAMMOGRAM  09/14/2023   DEXA SCAN  09/14/2023   Medicare Annual Wellness (AWV)  05/29/2024   DTaP/Tdap/Td (2 - Td or Tdap) 12/05/2026   Pneumonia Vaccine 79+ Years old  Completed   INFLUENZA VACCINE  Completed   Hepatitis C Screening  Completed   Zoster Vaccines- Shingrix  Completed   HPV VACCINES  Aged Out    -See a dentist at least yearly  -Get your eyes checked and then per your eye specialist's recommendations  -Other issues addressed today:   -I have included below further information regarding a healthy whole foods based diet, physical activity guidelines for adults, stress management and opportunities for social connections. I hope you find this information useful.   -----------------------------------------------------------------------------------------------------------------------------------------------------------------------------------------------------------------------------------------------------------  NUTRITION: -eat real food: lots of colorful vegetables (half the plate) and fruits -5-7 servings of vegetables and fruits per day (fresh or steamed is best), exp. 2 servings of vegetables with lunch and dinner and 2 servings of fruit per day. Berries and greens such as kale and collards are great choices.  -consume on a regular basis: whole grains (make sure first ingredient on label contains the word "whole"), fresh fruits, fish, nuts, seeds, healthy oils (such as olive oil, avocado oil, grape seed oil) -may eat small amounts  of dairy and lean meat on occasion, but avoid processed meats such as ham, bacon, lunch meat, etc. -drink water -try to avoid fast food and pre-packaged foods, processed meat -most experts advise limiting sodium to < 2300mg  per day, should limit further is any chronic conditions such as high blood pressure, heart disease, diabetes, etc. The American Heart Association advised that < 1500mg  is is ideal -try to avoid foods that contain any ingredients with names you do not  recognize  -try to avoid sugar/sweets (except for the natural sugar that occurs in fresh fruit) -try to avoid sweet drinks -try to avoid white rice, white bread, pasta (unless whole grain), white or yellow potatoes  EXERCISE GUIDELINES FOR ADULTS: -if you wish to increase your physical activity, do so gradually and with the approval of your doctor -STOP and seek medical care immediately if you have any chest pain, chest discomfort or trouble breathing when starting or increasing exercise  -move and stretch your body, legs, feet and arms when sitting for long periods -Physical activity guidelines for optimal health in adults: -least 150 minutes per week of aerobic exercise (can talk, but not sing) once approved by your doctor, 20-30 minutes of sustained activity or two 10 minute episodes of sustained activity every day.  -resistance training at least 2 days per week if approved by your doctor -balance exercises 3+ days per week:   Stand somewhere where you have something sturdy to hold onto if you lose balance.    1) lift up on toes, start with 5x per day and work up to 20x   2) stand and lift on leg straight out to the side so that foot is a few inches of the floor, start with 5x each side and work up to 20x each side   3) stand on one foot, start with 5 seconds each side and work up to 20 seconds on each side  If you need ideas or help with getting more active:  -Silver sneakers https://tools.silversneakers.com  -Walk with  a Doc: http://www.duncan-williams.com/  -try to include resistance (weight lifting/strength building) and balance exercises twice per week: or the following link for ideas: http://castillo-powell.com/  BuyDucts.dk  STRESS MANAGEMENT: -can try meditating, or just sitting quietly with deep breathing while intentionally relaxing all parts of your body for 5 minutes daily -if you need further help with stress, anxiety or depression please follow up with your primary doctor or contact the wonderful folks at [facility]: [phone]  SOCIAL CONNECTIONS: -options in [place] if you wish to engage in more social and exercise related activities:  -Silver sneakers https://tools.silversneakers.com  -Walk with a Doc: http://www.duncan-williams.com/  -Check out the [facility] Active Adults 50+ section on the [place] of [employer] (hiking clubs, book clubs, cards and games, chess, exercise classes, aquatic classes and much more) - see the website for details: https://www.[place].gov/departments/parks-recreation/active-adults50  -YouTube has lots of exercise videos for different ages and abilities as well  -[facility] (a variety of indoor and outdoor inperson activities for adults). [phone]. [address]  -Virtual Online Classes (a variety of topics): see seniorplanet.org or call 706-498-2363  -consider volunteering at a school, hospice center, church, senior center or elsewhere           Terressa Koyanagi, DO

## 2023-06-04 ENCOUNTER — Ambulatory Visit: Payer: HMO | Admitting: Psychiatry

## 2023-06-07 ENCOUNTER — Ambulatory Visit: Payer: HMO | Admitting: Psychiatry

## 2023-06-07 DIAGNOSIS — F3162 Bipolar disorder, current episode mixed, moderate: Secondary | ICD-10-CM | POA: Diagnosis not present

## 2023-06-07 NOTE — Progress Notes (Signed)
[facility] Counselor/Therapist Progress Note  Patient ID: MALINI LEHRMANN, MRN: [id number],    Date: 06/07/2023  Time Spent: 53 minutes   Treatment Type: Individual Therapy  Reported Symptoms:  anxiety, depression improved some, overthinking improved some, some loneliness but better   Mental Status Exam:  Appearance:   Casual     Behavior:  Appropriate, Sharing, and Motivated  Motor:  Normal  Speech/Language:   Clear and Coherent  Affect:  Depressed and anxious  Mood:  anxious and depressed  Thought process:  goal directed  Thought content:    Rumination  Sensory/Perceptual disturbances:    WNL  Orientation:  oriented to person, place, time/date, situation, day of week, month of year, year, and stated date of Dec. 20, 2024  Attention:  Fair  Concentration:  Good and Fair  Memory:  WNL Feels memory has improved some .  Fund of knowledge:   Good  Insight:    Fair  Judgment:   Fair  Impulse Control:  Fair   Risk Assessment: Danger to Self:  No Self-injurious Behavior: No Danger to Others: No Duty to Warn:no Physical Aggression / Violence:No  Access to Firearms a concern: No  Gang Involvement:No   Subjective: Patient today reporting anxiety, depression, overthinking, some loneliness, and easily hurt by others. Worked on these symptoms today and especially how they feed her sadness. Also working on "not making and stressing over negative assumptions which may end up not being true" and frequently impacts her mood in negative ways. Talked through her sad and negative feelings about adult kids not giving her enough time and ends up feeling hurt in ways that she finds difficult to manage or "let go of, in order to move forward." Feels ignored at times by son" which contributes to her feeling ignored and at times alone. Working on not "looking for what might go wrong."Trying to refrain from over-personalizing comments and behaviors within her family." Talked about being more  aware of positives in her life and how she can hold on to them. Living situation at her apartment seems some better now.   Interventions: Cognitive Behavioral Therapy and Ego-Supportive  Interventions: Cognitive Behavioral Therapy and Ego-Supportive 1) Long Term Goal: Develop and follow through on strategies to reduce symptoms.     Short Term Goal: Reduce anxiety and improve coping skills.     Objective: Learn two new ways of coping with daily stressors.     Objective: Develop strategies for thought distraction when fixating on the future.     Objective: Manage panic episodes. 2) Long Term Goal: Maintain stability of mood     Short Term Goal: Increase ability to manage moods.     Objective: Discuss and resolve troubling personal and interpersonal issues.   Diagnosis:   ICD-10-CM   1. Bipolar 1 disorder, mixed, moderate (HCC)  F31.62      Plan: Patient did well in session today working further on some of her painful experiences and "assumptions" within the family that contribute to her depression, anxiety, jumping to conclusions, focusing more on negatives, and looking more at what might go wrong versus right.  She did some good work today and looking at some of her negative ways of thinking and trying to change them and ways that do not always lead her in a negative direction.  Did well and confronting some of her negative self talk and assumptions and trying to reframe them in different ways that would not be as hurtful nor  limiting to patient.  Encouraged her to continue working on this between sessions and we will see her again within 2 weeks.  Also encouraged her to continue efforts to make better choices, take advantage of positive opportunities as offered by some of her extended family, and feel good about the work she is doing in therapy to try to make changes in some thoughts and ways of thinking that have been harmful to her over the years.  Supported and encouraged patient in her  continuing to work on not making negative assumptions about others nor herself, trying to have a healthier mindset, good Tree surgeon, use of more positive/encouraging self talk, remain involved in activities that nurture and help her to maintain stability in her mood, refrain from identifying herself as a failure, refrain from getting overly fixated on the future, continue working with strategies regarding thought distractions, stay in the present versus the past, emphasize her positives, practice "the pause" as needed, let her faith be a resource for emotional stability as well as spiritual, understand that her anxious thoughts are "just thoughts" and are not necessarily going to play out in reality the way she might assume, and recognize the strength she shows working with goal-directed behaviors to move in a direction of improved emotional health, hopefulness, and her overall wellbeing.  Goal review and progress/challenges noted with patient.  Next appointment within 2 weeks.   Mathis Fare, LCSW

## 2023-06-09 ENCOUNTER — Other Ambulatory Visit: Payer: Self-pay | Admitting: Psychiatry

## 2023-06-09 DIAGNOSIS — F4312 Post-traumatic stress disorder, chronic: Secondary | ICD-10-CM

## 2023-06-14 ENCOUNTER — Ambulatory Visit (INDEPENDENT_AMBULATORY_CARE_PROVIDER_SITE_OTHER): Payer: HMO | Admitting: Psychiatry

## 2023-06-14 ENCOUNTER — Encounter: Payer: Self-pay | Admitting: Psychiatry

## 2023-06-14 DIAGNOSIS — F5105 Insomnia due to other mental disorder: Secondary | ICD-10-CM | POA: Diagnosis not present

## 2023-06-14 DIAGNOSIS — F515 Nightmare disorder: Secondary | ICD-10-CM

## 2023-06-14 DIAGNOSIS — F4310 Post-traumatic stress disorder, unspecified: Secondary | ICD-10-CM | POA: Diagnosis not present

## 2023-06-14 DIAGNOSIS — F4312 Post-traumatic stress disorder, chronic: Secondary | ICD-10-CM | POA: Diagnosis not present

## 2023-06-14 DIAGNOSIS — F3162 Bipolar disorder, current episode mixed, moderate: Secondary | ICD-10-CM

## 2023-06-14 DIAGNOSIS — F4001 Agoraphobia with panic disorder: Secondary | ICD-10-CM

## 2023-06-14 LAB — LIPID PANEL

## 2023-06-14 LAB — SPECIMEN STATUS REPORT

## 2023-06-14 MED ORDER — DOXAZOSIN MESYLATE 4 MG PO TABS: 8.0000 mg | ORAL_TABLET | Freq: Every day | ORAL | 1 refills | Status: DC

## 2023-06-14 MED ORDER — MIRTAZAPINE 45 MG PO TABS: 45.0000 mg | ORAL_TABLET | Freq: Every day | ORAL | 1 refills | Status: DC

## 2023-06-14 MED ORDER — CLONAZEPAM 0.5 MG PO TABS: ORAL_TABLET | ORAL | 0 refills | Status: DC

## 2023-06-14 MED ORDER — BUSPIRONE HCL 15 MG PO TABS: 15.0000 mg | ORAL_TABLET | Freq: Three times a day (TID) | ORAL | 1 refills | Status: DC

## 2023-06-14 NOTE — Progress Notes (Signed)
Brittany [name] [id number] Sep 01, 1955 67 y.o.     Subjective:   Patient ID:  Brittany [name] is a 67 y.o. (DOB 1955-07-23) female.  Chief Complaint:  Chief Complaint  Patient presents with   Follow-up   Depression   Anxiety   Stress    health   Sleeping Problem    Anxiety Symptoms include nervous/anxious behavior and shortness of breath. Patient reports no confusion, decreased concentration, dizziness or suicidal ideas.    Depression        Associated symptoms include fatigue.  Associated symptoms include no decreased concentration and no suicidal ideas.  Past medical history includes anxiety.    Brittany [name] presents to the office today for follow-up of long-term depression and anxiety and insomnia.    At visit September 08, 2018 at which time the patient was weaned off of gabapentin and started on Trileptal up to twice daily.  She has been very med sensitive and difficult to treat because of that.  When seen September 19, 2018.  No meds were changed.  She stopped oxcarbazapine on her own DT itching.  seen July 2020.  She did not want to try any additional medications despite ongoing depression.  She wanted to stop lithium despite risk of worsening depression.  She was given instructions about how to do it in the safest manner possible.  Last seen August 12, 2019..   Still taking diazepam 5 TID, mirtazapine 15, doxazosin increased to 8 mg nightly for nightmares from 6 mg.    10/16/2019 appointment, the following noted: NM much better with increase prazosin to 8 mg HS and anxiety better that was related and tolerated.  Overall anxiety still a problem.  depression and anxiety much worse for a week after 2nd Covid vaccine recently.   Also overall more anxious and agoraphobia is bad.  Does force herself to go to [employer] weekly.   Averaging diazepam 5 mg BID-TID daily.  No drowsiness.  Avoids more at night bc of worsening NM from it. Weird dreams and occ awakens with panic.  Lost  another "fur baby" that's 2 in 1 year early 2021.  $ and health stress.  Dreams are worse than last visit.  No SE.  Mirtazapine helps sleep generally and much better with it than without it.  Post Covid November 2020. Got significantly better in depression since here.  Prayed a lot about it and thought the lithium was helping.  Feels better now off it.  Was going through a lot at the time.  But after a month more peace and joy.   Buspirone helped.  Diazepam better than Xanax which she feels triggered dissociation and irritabilty.  Lost her dog which is hard.  Prayed and that helped.  Oldest GS joined special forces and gave her his car.  Anxiety driving but does it.   Plan: no med changes  02/09/20 appt with the following noted: Went back down on doxazosin to 4 mg bc of dizziness but it's not better.  She plans to talk to her other doctors about it.. CO anxiety and wanting to take more meds for it.  Anxiety without reason generally. Increased Diazepam from 5 mg TID to QID end May 2021.   Worsening mirtazapine also.  Has done some night eating without memory the next day. Doesn't seem to be anxiety causing insomnia.  Plan: Start topiramate off label for anxiety and weight loss at 25 mg daily and increase gradually to 100 mg daily.  06/21/2020  appointment with the following noted: Had problems with night terrors again with stress around $ and pet sick.  Getting better again.   Still taking topiramate 100 mg started last visit. Lost weight DT stomach problems.  Doctors cannot explain.  Feels like poor absorption of nutrients.  Difficulty tolerating foods.  Dx bacterial overgrowth in small intestine but corrected. Stomach constantly burns.  Sx were present before topiramate? Anxiety is OK if stays home but is worse in public.  Meds are helping anxiety.  Not markedly depressed.  Meds are helping.  11/30/20 appt noted: Chronic anxiety averaging ? Amount daily of diazepam.  Anxiety is higher out of the  house.  Gets out of the house regularly to help care for 7 mos old GS.  D took her to beach last week.   Has 5 gallon fish tank with some guppies.   Occ night terrrors. Occ EMA Taking doxazosin 6 mg HS, 4 mg was less effective. Generally more trouble with anxiety than depres but some depression occ with $ stress and despise where she lives.   Ketogenic diet helped GI usually but some pain today.  Haunted with medical bills. Had Covid hosp 11 days and then home with O2 for a month.  Pulm said don't get Covid again.  Keeps her from going to church regularly but that would help. Plan: Also topiramate Continue buspirone 15 mg 3 times daily,  and mirtazapine 30 mg nightly for sleep.  Has been compliant with Valium.  She asks about an increase to increase.  OK continue Valium 5 mg  QID prn but take LED. Cont buspirone 15 mg TID, doxazosin 6-8 mg HS  03/29/2021 appointment with the following noted: Has aquarium. Doing OK except some seasonal depression and irritability noticed. Increased doxazosin between 6-8 mg HS.  Still having weird dreams but not having NM or night terrrors usually. No med changes  09/27/2021 appointment with the following noted: A lot better than normal Oct=Feb.  New GS, D married and some things to look forward to but still some anniversaries of losses.  Helping D with 78 mo old GS helps her a lot.  But did gain weight over the winter.  Dx CAD and put on Statin with cog and depressive and anxiety SE.  Current one not as bad but a little more anxiety and using a little more diazepam.  Nervous about people working in her appt. Going to [facility] on plane for first time with her family.  Excited but nervous too.   Plan no med changes  01/25/2022 appointment noted: No concerns about psych meds except Chronic $ stress.  Has to make choices about essentials.  I put myself into dept living on credit cards. Will go to sleep but EMA 40% of time despite mirtazapine.  Not usually a  napper but on occ.    Can't tolerate statins. Even with CoQ10. Chronic med sensitivity.  Brought Genesight test. Got SI about 2-4 weeks into statin and better off it after 3-4 weeks off it. Minimal depression at this time.  Some chronic anxiety which is worse than depression. Disc problematic night eating.  07/02/2022 phone call complaining of high depression and anxiety.  Appointment was moved up.  07/03/22 appointment noted: Current psych meds diazepam 5 mg every 6 hours as needed, mirtazapine 45 mg nightly, buspirone 15 mg 3 times daily, doxazosin 4 to 8 mg nightly prn. Thinks mood got worse when on statins.  Stopped it in summer.  Then took  red yeast rice and had a little with it too.   Gained wt in summer from foot surgery.  $ stress from living on credit cards. Complaining of much worse depression and anxiety.  Increased SI persistent like 10-15 years ago. Don't like crowds.  Frustrated and irritable. Thoutghts jumbled. Was at diazepam 5 mg 3 daily and now 4 daily.  At times has taken 10 mg at a time. Plans to restart therapy.  Wants Christian therapist. Chronically fearful of psych meds and doesn't want to start something extra. Still a lot of GI problems. Plan: DC mirtazapine. Caplyta 42 mg HS trial  07/31/22 TC stopped Caplyta and doesn't want to try another med.  Complained of dizziness and being cold with it.  Didn't make her sleepy.    09/04/22 appt noted: Resumed mirtazapine 45 HS, diazepam 5 , buspirone 15 TID, doxazosine 4-8 mg HS. NM occ but less severe.   Still has some EMA.  She doesn't want to change doses. Seeing therapist has helped her cope and mood and anxiety is better.  She doesn't feel she needs more meds or other meds.  No severe mood swings.  Not severely depressed.  Anxiety manageable usually.   Was more irritable when visited D and 2yo grandson.  Did miss meds more while there. No SE with current meds. Insurance will cover 100 days of meds. I think I have a  control problem.   Plan no changes  01/01/23 appt noted: Meds as above:  buspirone 15 TID, diazepam 5-10 mg TID prn, doxazosin 4-8 mg HS, mirtazapine 45 HS. Anxiety worse in the am.  Has increased diazepam use and feels it is less effective.  More $ strain DT inflation & credit card debt and trying to get out of the problem. Lost 30#.  Disc wt loss and temp sensitivity.  Stopped bread and sugar and dairy.   Tries to walk her dog daily.   Working on heart health bc genetics.   Night terrors stopped and reduced doxazosin but didn't slep as well.  So went back up and didn't solve the problems.   More EMA than before.  Sometimes naps an hour.   Mood "rollercoaster".  This weekend dep with SI without reason. New cholesterol med; not a statin.  HX statins with strong SI. Mind constantly racing on random things with some worry related to health and $. At times dep worse than anxiety.   Thinks mirtazapine may help her fall asleep faster.  Doesn't sleep any better with less than 45 mg mirtazapine.   Plan: Continue buspirone 15 mg 3 times daily,  mirtazapine per her request at 22.5-45 mg HS.  Marland Kitchen Switch clonazepam 0.5 mg BID & 1mg  hS Continue Vitamin D Doxazosin 8 mg HS  03/05/23 appt noted: Re: switch from diazepam to clonazepam "I wish you had switched me years ago". Slowed down racing thoughts.  Sleep is a little better.  Not as anxious when awaken at night.  Usually anxiety is worse in the am and not as bad.  Response to clonazepam is smoother.  Not as sensitive to a missed dose.  Family under a lot of stress right now.  D dx with aggressive breast Ca.  D married with a 56 yo son.  D more anxious and dep and pending surgery and chemo.  Pending double mastectomy and radiation.  She is first person pt knows personally with this.   Plan no changes  06/14/23 appt noted: Still in therapy with Eunice Blase  Dowd LCSW. Psych meds: as above D battling breast CA. Pretty good considering re: mental health.   Increased vitamin D for the winter. Low Motivation and impulsive eating.  Fought so hard to lose wt and realized genetics for high cholesterol.  Working to change mind set.   Better focus and less mind racing with clonazepam.  Occ night without sleep.  Last night EMA 2 AM and back to sleep at 6.  Now about once every couple weeks with clonazepam.   Doxazosin stopped NM and night terrors.  Only a couple in last 3 mos. Burgess Estelle is oldest D's 31 th birthday; she is the one who only lived 2 days.  Yesterday was a bad day.   Hasn't read much in years.  But plans to get back into it.   Wonders about hypnosis for eating problems and impulse control with spending.   Has made progress on latter. No mania.   Worries about weight gain with meds generally fearful of meds.  Chronic $ problems.  Stopped therapy about September 2020.  Multiple med failures.  Med sensitive & cost limit options. restoril, diazepam is ok. NO MORE Xanax DT dissociation and irritability,   Trazodone NR, Ambien, hyrdroxyzine, prazosin, doxazosin,  propranolol,  mirtazapine 45 sertraline, fluoxetine NR, Lexapro,   Buspar 15 TID, gabapentin,  Latuda, Olanzapine, risperidone with AE, Seroquel SE, Geodon, Saphris,  Abilify, Rexulti seemed to help for short while but $, Caplyta 42 dizzy  Trileptal itching, lithium NR, CBZ SE, Lamotrigine, VPA,  History statins strong SI  Review of Systems:  Review of Systems  Constitutional:  Positive for fatigue.  HENT:  Negative for congestion.   Respiratory:  Positive for shortness of breath and wheezing.   Gastrointestinal:  Negative for abdominal distention.  Musculoskeletal:  Positive for back pain.  Neurological:  Negative for dizziness, tremors and light-headedness.  Psychiatric/Behavioral:  Positive for dysphoric mood and sleep disturbance. Negative for agitation, behavioral problems, confusion, decreased concentration, hallucinations, self-injury and suicidal ideas. The  patient is nervous/anxious. The patient is not hyperactive.        Please refer to HPI  Stress eating.  Medications: I have reviewed the patient's current medications.  Current Outpatient Medications  Medication Sig Dispense Refill   albuterol (PROVENTIL) (2.5 MG/3ML) 0.083% nebulizer solution Take 3 mLs (2.5 mg total) by nebulization every 6 (six) hours as needed for wheezing or shortness of breath. 75 mL 12   Albuterol Sulfate (PROAIR RESPICLICK) 108 (90 Base) MCG/ACT AEPB Inhale 2 puffs into the lungs every 6 (six) hours as needed. 1 each 5   Azelastine HCl 137 MCG/SPRAY SOLN SPRAY 1 SPRAY IN EACH NOSTRIL TWICE DAILY AS NEEDED 60 mL 1   cetirizine (ZYRTEC) 10 MG tablet Take 1 tablet (10 mg total) by mouth daily. 90 tablet 1   Cholecalciferol (VITAMIN D-3 PO) Take 10,000 Units by mouth daily.      dicyclomine (BENTYL) 20 MG tablet TAKE ONE TABLET BY MOUTH FOUR TIMES A DAY AS NEEDED FOR SPASMS 360 tablet 3   Evolocumab (REPATHA SURECLICK) 140 MG/ML SOAJ Inject 140 mg into the skin every 14 (fourteen) days. 6 mL 3   famotidine (PEPCID) 20 MG tablet Take 1 tablet (20 mg total) by mouth 2 (two) times daily. (Patient taking differently: Take 20 mg by mouth as needed.) 180 tablet 3   fluticasone (FLONASE) 50 MCG/ACT nasal spray Place 2 sprays into both nostrils in the morning and at bedtime. 96 mL 2   fluticasone-salmeterol (ADVAIR) 250-50  MCG/ACT AEPB Inhale 1 puff into the lungs every 12 (twelve) hours. 180 each 1   montelukast (SINGULAIR) 10 MG tablet Take 1 tablet (10 mg total) by mouth at bedtime. 100 tablet 2   Multiple Vitamin (MULTIVITAMIN WITH MINERALS) TABS tablet Take 1 tablet by mouth daily.     pantoprazole (PROTONIX) 40 MG tablet Take 1 tablet (40 mg total) by mouth daily. 90 tablet 3   sodium chloride (OCEAN) 0.65 % SOLN nasal spray Place 1 spray into both nostrils as needed for congestion.     busPIRone (BUSPAR) 15 MG tablet Take 1 tablet (15 mg total) by mouth 3 (three) times  daily. 300 tablet 1   clonazePAM (KLONOPIN) 0.5 MG tablet TAKE 1 TABLET BY MOUTH 2 TIMES A DAY AND 2 AT BEDTIME 400 tablet 0   doxazosin (CARDURA) 4 MG tablet Take 2 tablets (8 mg total) by mouth at bedtime. 200 tablet 1   ezetimibe (ZETIA) 10 MG tablet Take 1 tablet (10 mg total) by mouth daily. 90 tablet 3   mirtazapine (REMERON) 45 MG tablet Take 1 tablet (45 mg total) by mouth at bedtime. 100 tablet 1   No current facility-administered medications for this visit.    Medication Side Effects: None  Allergies:  Allergies  Allergen Reactions   Aspirin Other (See Comments)    discomfort   Atorvastatin     Patient states she felt suicidal   Codeine Itching   Crestor [Rosuvastatin]     Dizzy, leg pain and states she felt suicidal   Gabapentin    Niacin And Related Itching   Sulfa Antibiotics     Other reaction(s): Unknown   Sulfamethoxazole-Trimethoprim Other (See Comments)    Unknown reaction per pt   Topiramate Other (See Comments)    Per patient, it causes stomach burning   Trileptal [Oxcarbazepine]     itching   Trimethoprim Other (See Comments)    Unknown, per pt   Ultram [Tramadol] Itching   Vioxx [Rofecoxib]     Unknown per pt    Past Medical History:  Diagnosis Date   Abnormal EKG    Allergy    Anxiety    Asthma    Bipolar depression (HCC), Anxiety, PTSD, Panic disorder    -managed by [facility]   Depression    Foot pain    GERD (gastroesophageal reflux disease)    Heart murmur    as a child   Insomnia    Leg swelling    Osteoporosis    osteopenia   Pneumonia due to COVID-19 virus 04/22/2019   Tachycardia     Family History  Problem Relation Age of Onset   Heart failure Mother    Hypertension Mother    Hyperlipidemia Mother    Congestive Heart Failure Mother    Other Father        killed   COPD Paternal Aunt        a lot of aunts and uncle COPD or Emphysema   Emphysema Paternal Uncle    Colon polyps Maternal Aunt    COPD Brother     Heart disease Brother    Pulmonary embolism Brother    Breast cancer Neg Hx    Colon cancer Neg Hx    Esophageal cancer Neg Hx    Pancreatic cancer Neg Hx    Stomach cancer Neg Hx    Liver disease Neg Hx    Rectal cancer Neg Hx     Social History   Socioeconomic  History   Marital status: Single    Spouse name: Not on file   Number of children: 3   Years of education: Not on file   Highest education level: Associate degree: occupational, Scientist, product/process development, or vocational program  Occupational History   Occupation: disabled  Tobacco Use   Smoking status: Former    Current packs/day: 0.00    Average packs/day: 20.0 packs/day for 1 year (20.0 ttl pk-yrs)    Types: Cigarettes    Start date: 06/18/1998    Quit date: 06/19/1999    Years since quitting: 24.0   Smokeless tobacco: Never   Tobacco comments:    20 year estimate but probably less   Vaping Use   Vaping status: Never Used  Substance and Sexual Activity   Alcohol use: Yes    Alcohol/week: 0.0 standard drinks of alcohol    Comment: occ   Drug use: No   Sexual activity: Never  Other Topics Concern   Not on file  Social History Narrative   Work or School: Disabled seconndary to psychiatric conditions      Home Situation: lives alone with 2 cats and one dog      Spiritual Beliefs: Christian      Lifestyle: no regular exercise, diet  Not great - wants to embark on healthier lifestyle   Social Drivers of Health   Financial Resource Strain: High Risk (04/24/2023)   Overall Financial Resource Strain (CARDIA)    Difficulty of Paying Living Expenses: Hard  Food Insecurity: Food Insecurity Present (04/24/2023)   Hunger Vital Sign    Worried About Running Out of Food in the Last Year: Often true    Ran Out of Food in the Last Year: Often true  Transportation Needs: Unmet Transportation Needs (04/24/2023)   PRAPARE - Administrator, Civil Service (Medical): Yes    Lack of Transportation (Non-Medical): Yes  Physical  Activity: Sufficiently Active (04/24/2023)   Exercise Vital Sign    Days of Exercise per Week: 5 days    Minutes of Exercise per Session: 30 min  Stress: Stress Concern Present (04/24/2023)   [employer] of Occupational Health - Occupational Stress Questionnaire    Feeling of Stress : To some extent  Social Connections: Moderately Isolated (04/24/2023)   Social Connection and Isolation Panel [NHANES]    Frequency of Communication with Friends and Family: Once a week    Frequency of Social Gatherings with Friends and Family: Once a week    Attends Religious Services: More than 4 times per year    Active Member of [employer] or Organizations: No    Attends Engineer, structural: More than 4 times per year    Marital Status: Divorced  Catering manager Violence: Not At Risk (05/24/2022)   Humiliation, Afraid, Rape, and Kick questionnaire    Fear of Current or Ex-Partner: No    Emotionally Abused: No    Physically Abused: No    Sexually Abused: No    Past Medical History, Surgical history, Social history, and Family history were reviewed and updated as appropriate.   Divorced twice.  Please see review of systems for further details on the patient's review from today.   Objective:   Physical Exam:  There were no vitals taken for this visit.  Physical Exam Constitutional:      General: She is not in acute distress. Musculoskeletal:        General: No deformity.  Neurological:     Mental Status: She is  alert and oriented to person, place, and time.     Cranial Nerves: No dysarthria.     Coordination: Coordination normal.  Psychiatric:        Attention and Perception: Attention and perception normal. She does not perceive auditory or visual hallucinations.        Mood and Affect: Mood is anxious and depressed. Affect is not labile, blunt, angry or inappropriate.        Speech: Speech normal. Speech is not rapid and pressured or slurred.        Behavior: Behavior normal.  Behavior is cooperative.        Thought Content: Thought content normal. Thought content is not paranoid or delusional. Thought content does not include homicidal or suicidal ideation. Thought content does not include suicidal plan.        Cognition and Memory: Cognition and memory normal.        Judgment: Judgment normal.     Comments: Insight and judgment fair.  not overtly manic.  chronic $ stress Anxiety and depression are better not gone.   Talkative Racing thoughts better.     Lab Review:     Component Value Date/Time   NA 139 04/26/2023 1125   NA 141 11/17/2021 0835   K 4.1 04/26/2023 1125   CL 105 04/26/2023 1125   CO2 28 04/26/2023 1125   GLUCOSE 92 04/26/2023 1125   BUN 19 04/26/2023 1125   BUN 16 11/17/2021 0835   CREATININE 0.73 04/26/2023 1125   CREATININE 0.94 04/06/2020 0916   CALCIUM 9.2 04/26/2023 1125   PROT 6.3 05/22/2023 1044   ALBUMIN 4.4 05/22/2023 1044   AST 13 05/22/2023 1044   ALT 16 05/22/2023 1044   ALKPHOS 61 05/22/2023 1044   BILITOT 0.3 05/22/2023 1044   GFRNONAA >60 05/01/2019 0155   GFRAA >60 05/01/2019 0155       Component Value Date/Time   WBC 6.8 04/26/2023 1125   RBC 4.89 04/26/2023 1125   HGB 15.5 (H) 04/26/2023 1125   HCT 45.8 04/26/2023 1125   PLT 338.0 04/26/2023 1125   MCV 93.8 04/26/2023 1125   MCH 31.7 04/06/2020 0916   MCHC 33.8 04/26/2023 1125   RDW 13.6 04/26/2023 1125   LYMPHSABS 0.8 05/10/2021 0934   MONOABS 0.3 05/10/2021 0934   EOSABS 0.1 05/10/2021 0934   BASOSABS 0.0 05/10/2021 0934    No results found for: "POCLITH", "LITHIUM"   Lab Results  Component Value Date   VALPROATE 25.3 (L) 10/26/2008     .res Assessment: Plan:    Brittany [name] was seen today for follow-up, depression, anxiety, stress and sleeping problem.  Diagnoses and all orders for this visit:  Bipolar 1 disorder, mixed, moderate (HCC)  PTSD (post-traumatic stress disorder) -     busPIRone (BUSPAR) 15 MG tablet; Take 1 tablet (15 mg total)  by mouth 3 (three) times daily. -     clonazePAM (KLONOPIN) 0.5 MG tablet; TAKE 1 TABLET BY MOUTH 2 TIMES A DAY AND 2 AT BEDTIME -     mirtazapine (REMERON) 45 MG tablet; Take 1 tablet (45 mg total) by mouth at bedtime.  Panic disorder with agoraphobia -     clonazePAM (KLONOPIN) 0.5 MG tablet; TAKE 1 TABLET BY MOUTH 2 TIMES A DAY AND 2 AT BEDTIME -     mirtazapine (REMERON) 45 MG tablet; Take 1 tablet (45 mg total) by mouth at bedtime.  Nightmares associated with chronic post-traumatic stress disorder -     doxazosin (CARDURA)  4 MG tablet; Take 2 tablets (8 mg total) by mouth at bedtime. -     mirtazapine (REMERON) 45 MG tablet; Take 1 tablet (45 mg total) by mouth at bedtime.  Insomnia due to mental condition -     clonazePAM (KLONOPIN) 0.5 MG tablet; TAKE 1 TABLET BY MOUTH 2 TIMES A DAY AND 2 AT BEDTIME -     mirtazapine (REMERON) 45 MG tablet; Take 1 tablet (45 mg total) by mouth at bedtime.   Chronic noncompliance complicates treatment but bettter lately Likey borderline personality disorder vs PTSD complication.  30 min face to face time with patient was spent on counseling and coordination of care. We discussed her chronic sx of depression, anxiety and insomnia combined with medication sensitivity which makes it very difficult to treat her.  Multiple med failures.  Limited insight into bipolar vs PD and has refused mood stabilizers.  This was discussed with her repeatedly. But she has not had manic episodes off mood stabilizers. Seems unusual that she's not having manic episodes apparently without mood stabilizer.  ? Racing thought related but not real prominent euphoria or irritability.  Cycles of dep.    May change dx back to major depression. Her faith helps.   At the visit July 03, 2022 she is complaining of more symptoms of depression and anxiety and irritability.  She is having additional financial stress but financial stress has been chronic.  She feels like her mood worsened  while on statins and has never recovered since the summer 2023 but it is progressively worse. She has failed to respond to multiple SSRIs and other psychiatric medications.    Option clonidine We discussed the short-term risks associated with benzodiazepines including sedation and increased fall risk among others.  She reports better anxiety control with diazepam as opposed to Xanax intially until now.  She does not want to be prescribed Xanax at any point in the future bc history of dissociation with it.  Discussed long-term side effect risk including dependence, potential withdrawal symptoms, and the potential eventual dose-related risk of dementia.  Evidently developing tolerance.  No higher BZ dose bc of this.  Disc risk.   Advantage of clonazepam which probably does have some mild mood stabilizing effects.   Reviewed Genesight results in detail previously.  Option Viibryd for anxiety.  T4 tier and she doesn't think she can afford.  Only options are generic.  Answered questions about trying hypnosis for overeating.  Continue buspirone 15 mg 3 times daily,   mirtazapine per her request at 22.5-45 mg HS.  Marland Kitchen  Better with Switch clonazepam 0.5 mg BID & 1mg  hS vs diazepam.  Continue Vitamin D  Continue doxazosin 8 mg HS for NM  No med changes  FU 3-4 mos  Meredith Staggers, MD, DFAPA   Please see After Visit Summary for patient specific instructions.  Future Appointments  Date Time Provider Department Center  06/21/2023  9:00 AM Mathis Fare, LCSW CP-CP None  07/05/2023 10:00 AM Mathis Fare, LCSW CP-CP None  07/19/2023 10:00 AM Mathis Fare, LCSW CP-CP None  08/01/2023 10:30 AM Unk Lightning, PA [facility]  08/02/2023 10:00 AM Mathis Fare, LCSW CP-CP None  08/16/2023 10:00 AM Mathis Fare, LCSW CP-CP None  08/30/2023 10:00 AM Mathis Fare, LCSW CP-CP None  10/24/2023  8:30 AM Karie Georges, MD [facility]  12/13/2023  9:30 AM GI-[facility]G DX DEXA 1 GI-BCGDG GI-BREAST  CE  12/13/2023 10:10 AM GI-[facility] MM 2 GI-BCGMM [facility] CE  No orders of the defined types were placed in this encounter.     -------------------------------

## 2023-06-21 ENCOUNTER — Ambulatory Visit: Payer: HMO | Admitting: Psychiatry

## 2023-06-28 ENCOUNTER — Ambulatory Visit: Payer: HMO | Admitting: Psychiatry

## 2023-06-28 DIAGNOSIS — F3162 Bipolar disorder, current episode mixed, moderate: Secondary | ICD-10-CM

## 2023-06-28 NOTE — Progress Notes (Signed)
 [facility] Counselor/Therapist Progress Note  Patient ID: Brittany [name], MRN: [id number],    Date: 06/28/2023  Time Spent: 55 minutes   Treatment Type: Individual Therapy  Reported Symptoms: anxiety, depression improving, overthinking, loneliness improving some   Mental Status Exam:  Appearance:   Casual     Behavior:  Appropriate, Sharing, and Motivated  Motor:  Normal  Speech/Language:   Clear and Coherent  Affect:  Anxious and some depression  Mood:  anxious and depressed  Thought process:  goal directed  Thought content:    Some rumination  Sensory/Perceptual disturbances:    WNL  Orientation:  oriented to person, place, time/date, situation, day of week, month of year, year, and stated date of Jan. 10, 2025  Attention:  Good  Concentration:  Good and Fair  Memory:  WNL  Fund of knowledge:   Good  Insight:    Good and Fair  Judgment:   Good and Fair  Impulse Control:  Good and Fair   Risk Assessment: Danger to Self:  No Self-injurious Behavior: No Danger to Others: No Duty to Warn:no Physical Aggression / Violence:No  Access to Firearms a concern: No  Gang Involvement:No   Subjective:  Patient in for session today and reporting depression, anxiety, overthinking, difficult relationships within family, easily hurt by others, and some loneliness but that has improved some.  Is quick to make assumptions often times regarding how others view her in negative ways and often sees that as being accurate, and working more on this in session. Encouraging her to not overly assume in situations with others and worked with several examples today that did reflect some growth on patient's part which she acknowledged as well. Hard to get motivated or energized to stop playing all the games on my phone when I need to be getting other things done. Admits it's taking way too much of my time and need to get household chores done and get outside and walk. Hard to break habits  that interrupt her from doing responsibilities around her house. Looking at her need to take better care of herself physically including walking more, having more contact with people. Assisting her in being able to see her positives as well as her need areas and trying to develop ways of working on those need areas and not avoid them. Working on issues regarding relationships with adult children and their families, especially when patient feels hurt by them.   Interventions: Cognitive Behavioral Therapy and Ego-Supportive  Interventions: Cognitive Behavioral Therapy and Ego-Supportive 1) Long Term Goal: Develop and follow through on strategies to reduce symptoms.     Short Term Goal: Reduce anxiety and improve coping skills.     Objective: Learn two new ways of coping with daily stressors.     Objective: Develop strategies for thought distraction when fixating on the future.     Objective: Manage panic episodes. 2) Long Term Goal: Maintain stability of mood     Short Term Goal: Increase ability to manage moods.     Objective: Discuss and resolve troubling personal and interpersonal issues.    Diagnosis:   ICD-10-CM   1. Bipolar 1 disorder, mixed, moderate (HCC)  F31.62      Plan:  Encouraged patient to continue her efforts to make better choices particularly where it relates to self-care and setting limits, take advantage of positive opportunities as offered by some of her extended family, and feel good about the work she is doing in therapy  to try to make changes in some thought patterns that have been negative and harmful to her over the years.  Continue to encourage her on not making negative assumptions about others nor herself, trying to have a healthier mindset, better money management, use of more positive/encouraging self talk, remain involved in activities that nurture and help her to maintain stability in her mood, refrain from identifying herself as a failure, refrain from getting  overly fixated on the future, continue working with strategies regarding thought distractions, stay in the present versus the past, emphasize her positives, practice the pause as needed, let her faith be a resource for emotional stability as well as spiritual, understand that her anxious thoughts are just thoughts and are not necessarily going to play out reality the way she might fear or assume, and realize the strength she shows working with goal-directed behaviors to move in a direction of improved emotional health, hopefulness, and a more positive outlook for her future.  Patient has made progress and reports a commitment to continue working with her goal-directed behaviors. I'm trying to be more positive in working on my issues and not assume so much negative. Wants to listen to more music, and start reading again as she finds that helpful and enjoyable.  Goal review and progress/challenges noted with patient.  Next appointment within 2 weeks.   Barnie Bunde, LCSW

## 2023-07-01 NOTE — Progress Notes (Signed)
 Chief Complaint: GERD Primary GI Doctor: (Previously Dr. Eda ) Dr. Federico  HPI: Patient is a 68 year old female patient, previously known to Dr. Eda, with past medical history of GERD, asthma, SIBO, and hyperlipidemia who was referred to me by Ozell Heron HERO, MD on 04/25/2023 for a complaint of GERD.  On 11/7/20204 patient seen by PCP and c/o uncontrolled GERD. Currently on Pantoprazole  40mg  po daily. GI referral placed.  GI history: On 02/12/22 seen by Dr Gean at [facility]. Patient given trial of rifaximin  for IBS-D and/or SIBO. (Per patient xifaxan  made her very ill) On 03/26/22 seen by Dr. Gean. Treated for IMO with Doxycycline  100 mg and Neomycin  500 mg x 10 days. (Worked well for her- improved diarrhea, abdominal pain)  Interval History Patient presents to establish care with our clinic for her chronic gastrointestinal issues.     Patient has history of GERD and currently taking Pantoprazole  40 mg po daily. She reports if she avoids late meals and lying down afterwards her symptoms are controlled most of the time. Intermittent issues with esophageal dysphagia eating solids,occurs 1-2 times per month. Patient denies nausea, vomiting, or weight loss. Admits weight gain.     Patient also has chronic issues with abdominal pain and altered bowels. Patient states her symptoms have increased in severity over the last 4 months with indulging in certain foods and increased stress. Patients daughter was diagnosed with breast CA and on chemotherapy. Patient is helping with her grandchildren. Patient describes the abdominal pain as a burning cramping pain generalized in upper/ lower abdomen. She describes it as constant. OTC apple cider with mother helps the discomfort. She also states the round of Doxycycline  and neomycin  prescribed for Sibo and IMO also helped. She has a lot of gas and bloating. Patient states on average she will have anywhere from 2-10 stools per day semi formed to loose. She uses  OTC imodium prn when she leaves house for appointments and travel. She has used dicyclomine  in the past and believed it did work, but she ran out of it. No blood in stool. She has also used Pepto in past, but caused dark stools so she discontinued. Socially drinks, few times a year. Former smoker, quit 20 years ago. She takes Ibuprofen alternated with Tylenol  prn for knee pain and headache. She stopped the NSAID's to see if it would improve abdominal pain, no improvement in symptoms. Patient's family history included some gastrointestinal issues but patient unsure etiology.  Wt Readings from Last 3 Encounters:  07/02/23 189 lb 2 oz (85.8 kg)  04/25/23 176 lb 9.6 oz (80.1 kg)  03/18/23 174 lb (78.9 kg)    Past Medical History:  Diagnosis Date   Abnormal EKG    Allergy    Anxiety    Arthritis    Asthma    Bipolar depression (HCC), Anxiety, PTSD, Panic disorder    -managed by [facility]   CAD (coronary artery disease)    Depression    Foot pain    Gallstones    GERD (gastroesophageal reflux disease)    Heart murmur    as a child   HLD (hyperlipidemia)    Insomnia    Leg swelling    Obesity    Osteoporosis    osteopenia   Pneumonia due to COVID-19 virus 04/22/2019   Tachycardia    Past Surgical History:  Procedure Laterality Date   BREAST BIOPSY Right    biopsy of nipple   CARPAL TUNNEL RELEASE Left  CESAREAN SECTION     x 2   CHOLECYSTECTOMY     COLONOSCOPY     ENDOMETRIAL ABLATION     EXCISION MORTON'S NEUROMA Left 01/11/2022   Procedure: SECOND WEBSPACE MORTON'S NEUROMA EXCISION;  Surgeon: Kit Rush, MD;  Location: [facility];  Service: Orthopedics;  Laterality: Left;   FOOT SURGERY Left    HAMMER TOE SURGERY Left 01/11/2022   Procedure: COLLATERAL LIGAMENT REPAIRS WITH SECOND AND THIRD HAMMER TOE CORRECTION;  Surgeon: Kit Rush, MD;  Location: [facility];  Service: Orthopedics;  Laterality: Left;   KNEE SURGERY Right     reconstruction for patellar dislocation   LAPAROSCOPY     x 2   TOENAIL EXCISION Left 01/11/2022   Procedure: HALLUX TOENAIL PERMANENT EXCISION;  Surgeon: Kit Rush, MD;  Location: [facility];  Service: Orthopedics;  Laterality: Left;   TUBAL LIGATION     ULNAR NERVE REPAIR Left    UPPER GASTROINTESTINAL ENDOSCOPY     WEIL OSTEOTOMY Left 01/11/2022   Procedure: SECOND, THIRD AND FOURTH WEIL OSTEOTOMIES;  Surgeon: Kit Rush, MD;  Location: [facility];  Service: Orthopedics;  Laterality: Left;   Current Outpatient Medications  Medication Sig Dispense Refill   albuterol  (PROVENTIL ) (2.5 MG/3ML) 0.083% nebulizer solution Take 3 mLs (2.5 mg total) by nebulization every 6 (six) hours as needed for wheezing or shortness of breath. 75 mL 12   Albuterol  Sulfate (PROAIR  RESPICLICK) 108 (90 Base) MCG/ACT AEPB Inhale 2 puffs into the lungs every 6 (six) hours as needed. 1 each 5   Ascorbic Acid  (VITA-C PO) Take 1 tablet by mouth daily.     Azelastine  HCl 137 MCG/SPRAY SOLN SPRAY 1 SPRAY IN EACH NOSTRIL TWICE DAILY AS NEEDED 60 mL 1   busPIRone  (BUSPAR ) 15 MG tablet Take 1 tablet (15 mg total) by mouth 3 (three) times daily. 300 tablet 1   cetirizine  (ZYRTEC ) 10 MG tablet Take 1 tablet (10 mg total) by mouth daily. 90 tablet 1   Cholecalciferol (VITAMIN D -3 PO) Take 10,000 Units by mouth daily.      clonazePAM  (KLONOPIN ) 0.5 MG tablet TAKE 1 TABLET BY MOUTH 2 TIMES A DAY AND 2 AT BEDTIME 400 tablet 0   dicyclomine  (BENTYL ) 10 MG capsule Take 1 capsule (10 mg total) by mouth 3 (three) times daily as needed for spasms. 90 capsule 2   doxazosin  (CARDURA ) 4 MG tablet Take 2 tablets (8 mg total) by mouth at bedtime. 200 tablet 1   Evolocumab  (REPATHA  SURECLICK) 140 MG/ML SOAJ Inject 140 mg into the skin every 14 (fourteen) days. 6 mL 3   ezetimibe  (ZETIA ) 10 MG tablet Take 1 tablet (10 mg total) by mouth daily. 90 tablet 3   fluticasone  (FLONASE ) 50 MCG/ACT nasal spray  Place 2 sprays into both nostrils in the morning and at bedtime. 96 mL 2   fluticasone -salmeterol (ADVAIR) 250-50 MCG/ACT AEPB Inhale 1 puff into the lungs every 12 (twelve) hours. 180 each 1   Menaquinone-7 (VITAMIN K2) 100 MCG CAPS Take 1 capsule by mouth daily.     mirtazapine  (REMERON ) 45 MG tablet Take 1 tablet (45 mg total) by mouth at bedtime. 100 tablet 1   montelukast  (SINGULAIR ) 10 MG tablet Take 1 tablet (10 mg total) by mouth at bedtime. 100 tablet 2   Multiple Vitamin (MULTIVITAMIN WITH MINERALS) TABS tablet Take 1 tablet by mouth daily.     Omega-3 Fatty Acids (OMEGA 3 PO) Take 1 capsule by mouth  daily.     pantoprazole  (PROTONIX ) 40 MG tablet Take 1 tablet (40 mg total) by mouth daily. 90 tablet 3   sodium chloride  (OCEAN) 0.65 % SOLN nasal spray Place 1 spray into both nostrils as needed for congestion.     Sodium Sulfate-Mag Sulfate-KCl (SUTAB ) [phone] MG TABS Take 24 tablets by mouth once for 1 dose. 24 tablet 0   dicyclomine  (BENTYL ) 20 MG tablet TAKE ONE TABLET BY MOUTH FOUR TIMES A DAY AS NEEDED FOR SPASMS (Patient not taking: Reported on 07/02/2023) 360 tablet 3   famotidine  (PEPCID ) 20 MG tablet Take 1 tablet (20 mg total) by mouth at bedtime. 90 tablet 3   No current facility-administered medications for this visit.   Allergies as of 07/02/2023 - Review Complete 07/02/2023  Allergen Reaction Noted   Aspirin  Other (See Comments)    Atorvastatin   01/04/2022   Codeine Itching 09/26/2017   Crestor  [rosuvastatin ]  08/16/2021   Gabapentin   09/04/2021   Niacin and related Itching 01/11/2021   Sulfa antibiotics  03/02/2021   Sulfamethoxazole-trimethoprim Other (See Comments)    Topiramate  Other (See Comments) 10/25/2020   Trileptal  [oxcarbazepine ]  11/28/2018   Trimethoprim Other (See Comments) 11/12/2014   Ultram [tramadol] Itching 09/26/2017   Vioxx [rofecoxib]     Family History  Problem Relation Age of Onset   Hypertension Mother    Hyperlipidemia Mother     Congestive Heart Failure Mother    Other Father        killed   COPD Brother    Heart disease Brother    Pulmonary embolism Brother    Colon polyps Maternal Aunt    COPD Paternal Aunt        a lot of aunts and uncle COPD or Emphysema   Emphysema Paternal Uncle    Breast cancer Daughter    Colon cancer Neg Hx    Esophageal cancer Neg Hx    Pancreatic cancer Neg Hx    Stomach cancer Neg Hx    Liver disease Neg Hx    Rectal cancer Neg Hx    Review of Systems:    Constitutional: No weight loss, fever, chills, weakness or fatigue HEENT: Eyes: No change in vision               Ears, Nose, Throat:  No change in hearing or congestion Skin: No rash or itching Cardiovascular: No chest pain, chest pressure or palpitations   Respiratory: No SOB or cough Gastrointestinal: See HPI and otherwise negative Genitourinary: No dysuria or change in urinary frequency Neurological: No headache, dizziness or syncope Musculoskeletal: No new muscle or joint pain Hematologic: No bleeding or bruising Psychiatric: No history of depression or anxiety   Physical Exam:  Vital signs: BP 116/72 (BP Location: Left Arm, Patient Position: Sitting, Cuff Size: Large)   Pulse 100   Ht 5' 0.75 (1.543 m) Comment: height measured without shoes  Wt 189 lb 2 oz (85.8 kg)   BMI 36.03 kg/m   Constitutional: Pleasant Caucasian female appears to be in NAD, Well developed, Well nourished, alert and cooperative Neck:  Supple Throat: Oral cavity and pharynx without inflammation, swelling or lesion.  Respiratory: Respirations even and unlabored. Lungs clear to auscultation bilaterally.   No wheezes, crackles, or rhonchi.  Cardiovascular: Normal S1, S2. Regular rate and rhythm. No peripheral edema, cyanosis or pallor.  Gastrointestinal:  Soft, nondistended, generalized abdominal tenderness with palpation. No rebound or guarding. Normal bowel sounds. No appreciable masses or hepatomegaly. Rectal:  Not performed.  Skin:    Dry and intact without significant lesions or rashes. Psychiatric: Oriented to person, place and time. Demonstrates good judgement and reason without abnormal affect or behaviors.  RELEVANT LABS AND IMAGING: CBC    Latest Ref Rng & Units 04/26/2023   11:25 AM 05/10/2021    9:34 AM 04/06/2020    9:16 AM  CBC  WBC 4.0 - 10.5 K/uL 6.8  4.4  4.7   Hemoglobin 12.0 - 15.0 g/dL 84.4  84.4  84.6   Hematocrit 36.0 - 46.0 % 45.8  45.9  45.7   Platelets 150.0 - 400.0 K/uL 338.0  318.0  273      CMP     Latest Ref Rng & Units 05/22/2023   10:44 AM 04/26/2023   11:25 AM 11/17/2021    8:35 AM  CMP  Glucose 70 - 99 mg/dL  92  94   BUN 6 - 23 mg/dL  19  16   Creatinine 9.59 - 1.20 mg/dL  9.26  9.24   Sodium 864 - 145 mEq/L  139  141   Potassium 3.5 - 5.1 mEq/L  4.1  4.4   Chloride 96 - 112 mEq/L  105  102   CO2 19 - 32 mEq/L  28  25   Calcium  8.4 - 10.5 mg/dL  9.2  9.7   Total Protein 6.0 - 8.5 g/dL 6.3  6.9  6.6   Total Bilirubin 0.0 - 1.2 mg/dL 0.3  0.3  0.4   Alkaline Phos 44 - 121 IU/L 61  58  68   AST 0 - 40 IU/L 13  16  16    ALT 0 - 32 IU/L 16  14  23      Lab Results  Component Value Date   TSH 1.70 04/23/2022   01/26/2020 echo- Left ventricular ejection fraction, by estimation, is 60 to 65%  01/25/21 EGD Impression:  - Z- line regular, 35 cm from the incisors.  - A few gastric polyps consistent with fundic gland polyps.  - Known duodenal bulb adenoma. Residual was removed today.  - The examination was otherwise normal. Path:Diagnosis Surgical [P], duodenal polyp - POLYPOID DUODENAL MUCOSA WITH PEPTIC INJURY. - NO ATYPIA, ADENOMATOUS CHANGE OR CARCINOMA. Per current guidelines, you should have a repeat endoscopy in 6-12 months to reassess for any residual polyp. Hoping the results were again negative, we would then repeat the EGD 1 year later before extending the interval between endoscopies.   08/08/20 EGD Impression:  - Normal esophagus.  - Erythematous mucosa in the gastric  body. Biopsied.  - A single duodenal polyp. Resected and retrieved. - Normal examined duodenum. Biopsied. Path:Diagnosis 1. Surgical [P], duodenal polyp, polyp (1) - DUODENAL ADENOMA. - NO HIGH GRADE DYSPLASIA OR MALIGNANCY. 2. Surgical [P], duodenal bxs - BENIGN SMALL BOWEL MUCOSA. - NO VILLOUS BLUNTING OR INCREASE IN INTRAEPITHELIAL LYMPHOCYTES. - NO DYSPLASIA OR MALIGNANCY. 3. Surgical [P], gastric - BENIGN GASTRIC MUCOSA. [place]OD PHLEGM IS NEGATIVE FOR HELICOBACTER PYLORI. - NO INTESTINAL METAPLASIA, DYSPLASIA, OR MALIGNANCY.  04/09/2018 colonoscopy, recall 10/24 (increased association of colon cancer in the setting of duodenal adenomas. Given that risk, I recommend that she had a colonoscopy 5 years after her last.) Impression:  - Diverticulosis in the entire examined colon.  - The examination was otherwise normal on direct and retroflexion views.  - No specimens collected.  04/09/2018 EGD Impression:  - Normal esophagus.  - Normal stomach. Biopsied.  - Normal examined duodenum. Biopsied. Path: Diagnosis 1. Surgical [P], random  duodenal bx - BENIGN SMALL BOWEL MUCOSA. - NO ACTIVE INFLAMMATION OR VILLOUS ATROPHY IDENTIFIED. 2. Surgical [P], random gastric - CHRONIC INACTIVE GASTRITIS, MILD. - THERE IS NO EVIDENCE OF HELICOBACTER PYLORI, DYSPLASIA OR MALIGNANCY. - SEE COMMENT. 3. Surgical [P], colon, random sites - BENIGN COLONIC MUCOSA. - NO SIGNIFICANT INFLAMMATION OR OTHER ABNORMALITIES IDENTIFIED. Microscopic Comment- A Warthin-Starry stain is performed to determine the possibility of the presence of Helicobacter pylori. The Warthin-Starry stain is negative for organisms of Helicobacter pylori.  Assessment: Encounter Diagnoses  Name Primary?   Gastroesophageal reflux disease, unspecified whether esophagitis present Yes   Benign tubular adenoma of duodenum    Small intestinal bacterial overgrowth (SIBO)    Generalized abdominal pain    Altered bowel habits     Flatulence    Bloating    Diarrhea, unspecified type    Esophageal dysphagia        68 year old female patient presents with several gastrointestinal issues that have been intermittent for several years. She does admit triggers can include eating certain foods and stress. Her GERD is managed if she adheres to strict GERD diet and no late meals in addition to taking the Pantoprazole  40 mg po daily. I will add Pepcid  20 mg at bedtime as needed. She is experiencing some dysphagia so I will go ahead and schedule endoscopy to rule out ring, web, or Barrett's. We can also evaluate for any residual duodenal polyp.     For the abdominal cramping, bloating, and diarrhea we discussed avoiding sugar/sweets. I will refill the dicyclomine  which seemed to helped some. Will consider retreatment of IMO, but I discussed with patient these individuals usually experience constipation which she denies. She does not tolerate Rifaximin . Will discuss with Dr. Federico how to proceed. Lastly, given the higher risk of colon CA in the setting of duodenal adenomas and her worsening symptoms will proceed with scheduling colonoscopy.   Plan: -Continue Pantoprazole  40 mg po daily -Start Famotidine  20 mg at bedtime prn -Reinforced GERD diet, no late meals.  -Dicyclomine  10 mg prn abdominal cramping, refill  -Schedule colonoscopy and EGD with possible dilatation with Dr.Dorsey in [facility]  Thank you for the courtesy of this consult. Please call me with any questions or concerns.   Terrion Gencarelli, FNP-C [facility] 07/02/2023, 10:07 AM  Cc: Ozell Heron HERO, MD

## 2023-07-02 ENCOUNTER — Ambulatory Visit: Payer: HMO | Admitting: Gastroenterology

## 2023-07-02 ENCOUNTER — Encounter: Payer: Self-pay | Admitting: Gastroenterology

## 2023-07-02 VITALS — BP 116/72 | HR 100 | Ht 60.75 in | Wt 189.1 lb

## 2023-07-02 DIAGNOSIS — D132 Benign neoplasm of duodenum: Secondary | ICD-10-CM | POA: Diagnosis not present

## 2023-07-02 DIAGNOSIS — R1310 Dysphagia, unspecified: Secondary | ICD-10-CM | POA: Diagnosis not present

## 2023-07-02 DIAGNOSIS — R197 Diarrhea, unspecified: Secondary | ICD-10-CM

## 2023-07-02 DIAGNOSIS — R1084 Generalized abdominal pain: Secondary | ICD-10-CM | POA: Diagnosis not present

## 2023-07-02 DIAGNOSIS — K219 Gastro-esophageal reflux disease without esophagitis: Secondary | ICD-10-CM | POA: Diagnosis not present

## 2023-07-02 DIAGNOSIS — R143 Flatulence: Secondary | ICD-10-CM | POA: Diagnosis not present

## 2023-07-02 DIAGNOSIS — R194 Change in bowel habit: Secondary | ICD-10-CM

## 2023-07-02 DIAGNOSIS — R140 Abdominal distension (gaseous): Secondary | ICD-10-CM

## 2023-07-02 DIAGNOSIS — K638219 Small intestinal bacterial overgrowth, unspecified: Secondary | ICD-10-CM | POA: Diagnosis not present

## 2023-07-02 DIAGNOSIS — R1319 Other dysphagia: Secondary | ICD-10-CM

## 2023-07-02 MED ORDER — DICYCLOMINE HCL 10 MG PO CAPS: 10.0000 mg | ORAL_CAPSULE | Freq: Three times a day (TID) | ORAL | 2 refills | Status: DC | PRN

## 2023-07-02 MED ORDER — SUTAB 1479-225-188 MG PO TABS: 24.0000 | ORAL_TABLET | Freq: Once | ORAL | 0 refills | Status: AC

## 2023-07-02 MED ORDER — FAMOTIDINE 20 MG PO TABS: 20.0000 mg | ORAL_TABLET | Freq: Every day | ORAL | 3 refills | Status: DC

## 2023-07-02 NOTE — Patient Instructions (Signed)
 We have sent the following medications to your pharmacy for you to pick up at your convenience:  Bentyl , Pepcid .  You have been scheduled for an endoscopy and colonoscopy. Please follow written instructions given to you at your visit today.  If you use inhalers (even only as needed), please bring them with you on the day of your procedure.  If you take any of the following medications, they will need to be adjusted prior to your procedure:   DO NOT TAKE 7 DAYS PRIOR TO TEST- Trulicity (dulaglutide) Ozempic, Wegovy (semaglutide) Mounjaro (tirzepatide) Bydureon Bcise (exanatide extended release)  DO NOT TAKE 1 DAY PRIOR TO YOUR TEST Rybelsus (semaglutide) Adlyxin (lixisenatide) Victoza (liraglutide) Byetta (exanatide) ___________________________________________________________  Rosine will receive your bowel preparation through [facility], which ensures the lowest copay and home delivery, with outreach via text or call from an [phone] number. Please respond promptly to avoid rescheduling. If you are interested in alternative options or have any questions please contact them at [phone]  Your Provider Has Sent Your Bowel Prep Regimen To [facility] What to expect. [facility] will contact you to verify your information and collect your copay, if applicable. Enjoy the comfort of your home while we deliver your prescription to you, free of any shipping charges. Fast, FREE delivery or shipping. [facility] accepts all major insurance benefits and applies discounts & coupons  Have additional questions? [facility]'s patient care team is always here to help.  Chat: [email or web address] Call: [phone] Email: [email or web address] NCPDP: [id number] How will we contact you? Welcome Phone call  a Welcome text and a Checkout link in a text Texts you receive from [phone] Are Not Spam.   *To set up delivery, you must complete the checkout process via link or speak to one of our  patient care representatives. If we are unable to reach you, your prescription may be delayed.

## 2023-07-02 NOTE — Progress Notes (Signed)
 I agree with the assessment and plan as outlined by Ms. May. Would be okay to see how she responds to dietary changes and dicyclomine  first before deciding on antibiotic therapy. Agree with EGD and colonoscopy for follow up of duodenal polyp, evaluation of dysphagia, and history of colon polyps.

## 2023-07-04 ENCOUNTER — Telehealth: Payer: Self-pay | Admitting: Gastroenterology

## 2023-07-04 ENCOUNTER — Other Ambulatory Visit: Payer: Self-pay | Admitting: Gastroenterology

## 2023-07-04 DIAGNOSIS — K638219 Small intestinal bacterial overgrowth, unspecified: Secondary | ICD-10-CM

## 2023-07-04 MED ORDER — DOXYCYCLINE HYCLATE 100 MG PO TABS: 100.0000 mg | ORAL_TABLET | Freq: Two times a day (BID) | ORAL | 0 refills | Status: AC

## 2023-07-04 MED ORDER — NEOMYCIN SULFATE 500 MG PO TABS: 500.0000 mg | ORAL_TABLET | Freq: Two times a day (BID) | ORAL | 0 refills | Status: AC

## 2023-07-04 NOTE — Telephone Encounter (Signed)
Called patient to inform of Ms. Brittany May, PA's recommendations, patient not available, message left via her VM per DPR.

## 2023-07-04 NOTE — Telephone Encounter (Signed)
Called patient in response to complaints of Abdominal pain. Patient states has been having abdominal pain for 2 weeks with a pain level 1-10, with a level of "11." Patient states she was seen via Ms. Deanna May,NP on 07/02/2023. Patient states she did not get the Bentyl from Pharmacy due to hoping to pick up the ABX therapy instead because she is on a fixed income. Pt also states she is already dealing with dietary needs at present would like to be able to get antibiotics. Please advise.

## 2023-07-04 NOTE — Telephone Encounter (Signed)
Inbound call from patient stating she is experiencing severe abdominal pain. Patient is requesting to know if she is able to be prescribed antibiotics. Please advise, thank you.

## 2023-07-04 NOTE — Progress Notes (Signed)
Patient has trailed bentyl without improvement in symptoms, will go ahead and give her ten days of doxycycline and neomycin for Sibo/IMO which has worked well in the past.

## 2023-07-05 ENCOUNTER — Ambulatory Visit: Payer: HMO | Admitting: Psychiatry

## 2023-07-19 ENCOUNTER — Ambulatory Visit: Payer: HMO | Admitting: Psychiatry

## 2023-07-19 DIAGNOSIS — F3162 Bipolar disorder, current episode mixed, moderate: Secondary | ICD-10-CM | POA: Diagnosis not present
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
## 2023-07-23 ENCOUNTER — Telehealth: Payer: Self-pay | Admitting: Cardiovascular Disease

## 2023-07-23 NOTE — Telephone Encounter (Addendum)
 Called patient back to let her know that we would still like her to get lipid panel done. Patient is frustrated about not having lipids done in the first place when she came in for lab work in December. Patient stated she feels like we do not care. Informed patient that we do care, and that is why we are calling to see if she is still going to get her lipid panel done. Patient has been under a lot of stress she stated, due to her daughter fighting cancer. Patient stated she is taking care of her daughter right now and trying to be strong. Informed patient that her daughter is lucky to have her and she is doing a great job. Tried to encourage patient that times are hard, but there are still people out here who care. Patient stated she would try to have lipid panel done this week.

## 2023-07-23 NOTE — Telephone Encounter (Signed)
 Patient is returning RN's call regarding her lab results. She reports she has already spoken with someone regarding her 12/04 results, but she would still like to receive the letter in the mail.   She reports she has not had the chance to go to a [facility] yet for the lipid panel to be drawn due to all that has been going on with her daughter having breast cancer, but is aware she needs it performed.

## 2023-07-24 ENCOUNTER — Encounter: Payer: Self-pay | Admitting: Internal Medicine

## 2023-07-28 ENCOUNTER — Telehealth: Payer: Self-pay | Admitting: Family Medicine

## 2023-07-28 NOTE — Telephone Encounter (Signed)
 [place] on-call  Patient connected with access nurse and due to fever of 101 as well as oxygen  saturations in the 91 to 94% range when they are typically higher than this she was recommended immediate evaluation by urgent care.  Patient declined and asked for physician consult-I agreed with recommendation.  Patient then asked to speak to me directly.  She had a negative home flu and COVID test but we discussed low specificity of home test and would want to confirm in office.  In addition she specifically requested antibiotic treatment but I explained to her we would not know what we are treating-she has had some ear and sinus pain and she wanted me to treat her for sinusitis with 1 day of symptoms but also reported today that the symptoms were improving-I once again recommended urgent care evaluation.  She continued to decline.  I asked her to try to connect with [facility] early tomorrow to get an appointment-I will forward to their office and PCP -She states she may have transportation difficulty and I asked her to work on that today

## 2023-07-29 ENCOUNTER — Telehealth: Payer: HMO | Admitting: Nurse Practitioner

## 2023-07-29 ENCOUNTER — Ambulatory Visit: Payer: Self-pay | Admitting: Family Medicine

## 2023-07-29 ENCOUNTER — Telehealth: Payer: HMO

## 2023-07-29 ENCOUNTER — Encounter: Payer: Self-pay | Admitting: Nurse Practitioner

## 2023-07-29 ENCOUNTER — Telehealth: Payer: Self-pay | Admitting: Internal Medicine

## 2023-07-29 VITALS — Temp 101.0°F | Ht 60.75 in | Wt 188.0 lb

## 2023-07-29 DIAGNOSIS — J069 Acute upper respiratory infection, unspecified: Secondary | ICD-10-CM | POA: Diagnosis not present

## 2023-07-29 MED ORDER — PREDNISONE 20 MG PO TABS: 40.0000 mg | ORAL_TABLET | Freq: Every day | ORAL | 0 refills | Status: DC

## 2023-07-29 MED ORDER — AMOXICILLIN-POT CLAVULANATE 875-125 MG PO TABS: 1.0000 | ORAL_TABLET | Freq: Two times a day (BID) | ORAL | 0 refills | Status: DC

## 2023-07-29 NOTE — Telephone Encounter (Signed)
 Called the patient to reschedule her colonoscopy in the [facility]. Phone rings then turns to a fast busy signal.

## 2023-07-29 NOTE — Patient Instructions (Signed)
 It was great to see you!  Start augmentin  twice a day for 10 days  Start prednisone  2 tablets daily with food in the morning  Keep taking your inhalers and nebulizers as prescribed  Let's follow-up if symptoms worsen or don't improve  Take care,  [name], NP

## 2023-07-29 NOTE — Telephone Encounter (Signed)
 Patient called and stated that she is running a fever and believes she has a sinus infection. Patient stated that she was prescribed antibiotics for her sinus infection. Patient is wanting to know if she is needing to reschedule her procedures. Patient is requesting a call back. Please advise.

## 2023-07-29 NOTE — Progress Notes (Signed)
 [facility] [address] [place] [address] Dept: [phone] Dept Fax: [phone]  Virtual Video Visit  I connected with [name] on 07/29/23 at  9:40 AM EST by a video enabled telemedicine application and verified that I am speaking with the correct person using two identifiers.  Location patient: Home Location provider: Clinic Persons participating in the virtual visit: Patient; Brittany Cedar, NP; [place] Churches, CMA  I discussed the limitations of evaluation and management by telemedicine and the availability of in person appointments. The patient expressed understanding and agreed to proceed.  Chief Complaint  Patient presents with   Headache    C/o having HA, ear pain, fever, light headed, chills, wheezing x 3 days.  Has taken taken Tylenol .     SUBJECTIVE:  HPI: Brittany [name] is a 68 y.o. female who presents with headache, ear pain, and wheezing for 3 days. Home covid and flu tests negative.   UPPER RESPIRATORY TRACT INFECTION  Fever: yes 100.8 Cough: yes - non-productive Shortness of breath: no Wheezing: yes Chest pain: no Chest tightness: no Chest congestion: no Nasal congestion: yes Runny nose: yes Post nasal drip: no Sneezing: no Sore throat: no Swollen glands: no Sinus pressure: yes Headache: yes Face pain: no Toothache: no Ear pain: no bilateral Ear pressure: yes bilateral Eyes red/itching:no Eye drainage/crusting: no  Vomiting: no Rash: no Fatigue: yes Sick contacts: no Strep contacts: no  Context: stable Recurrent sinusitis: no Relief with OTC cold/cough medications: no  Treatments attempted: albuterol  neb, tylenol     Patient Active Problem List   Diagnosis Date Noted   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   Healthy adult on routine physical examination 04/25/2022   Asthma exacerbation 04/09/2022   Small intestinal bacterial overgrowth (SIBO) 04/09/2022   Fatigue  01/08/2022   Morbid obesity (HCC) 01/08/2022   Pulmonary fibrosis (HCC) 04/06/2020   Dizziness 02/11/2020   Melena 04/22/2019   Suprapubic abdominal pain 04/22/2019   Osteopenia 12/04/2016   Chronic post-traumatic stress disorder (PTSD) 08/23/2016   Bipolar affective disorder, current episode depressed (HCC) 08/23/2016   Hyperlipidemia 08/23/2016   COPD / AB vs ACOS 06/22/2016   Cough variant asthma  vs UACS  06/21/2016   GERD 04/03/2007    Past Surgical History:  Procedure Laterality Date   BREAST BIOPSY Right    biopsy of nipple   CARPAL TUNNEL RELEASE Left    CESAREAN SECTION     x 2   CHOLECYSTECTOMY     COLONOSCOPY     ENDOMETRIAL ABLATION     EXCISION MORTON'S NEUROMA Left 01/11/2022   Procedure: SECOND WEBSPACE MORTON'S NEUROMA EXCISION;  Surgeon: Amada Backer, MD;  Location: [facility];  Service: Orthopedics;  Laterality: Left;   FOOT SURGERY Left    HAMMER TOE SURGERY Left 01/11/2022   Procedure: COLLATERAL LIGAMENT REPAIRS WITH SECOND AND THIRD HAMMER TOE CORRECTION;  Surgeon: Amada Backer, MD;  Location: [facility];  Service: Orthopedics;  Laterality: Left;   KNEE SURGERY Right    reconstruction for patellar dislocation   LAPAROSCOPY     x 2   TOENAIL EXCISION Left 01/11/2022   Procedure: HALLUX TOENAIL PERMANENT EXCISION;  Surgeon: Amada Backer, MD;  Location: [facility];  Service: Orthopedics;  Laterality: Left;   TUBAL LIGATION     ULNAR NERVE REPAIR Left    UPPER GASTROINTESTINAL ENDOSCOPY     WEIL OSTEOTOMY Left 01/11/2022   Procedure: SECOND, THIRD AND FOURTH WEIL OSTEOTOMIES;  Surgeon: Amada Backer, MD;  Location: [facility];  Service: Orthopedics;  Laterality: Left;    Family History  Problem Relation Age of Onset   Hypertension Mother    Hyperlipidemia Mother    Congestive Heart Failure Mother    Other Father        killed   COPD Brother    Heart disease Brother    Pulmonary embolism  Brother    Colon polyps Maternal Aunt    COPD Paternal Aunt        a lot of aunts and uncle COPD or Emphysema   Emphysema Paternal Uncle    Breast cancer Daughter    Colon cancer Neg Hx    Esophageal cancer Neg Hx    Pancreatic cancer Neg Hx    Stomach cancer Neg Hx    Liver disease Neg Hx    Rectal cancer Neg Hx     Social History   Tobacco Use   Smoking status: Former    Current packs/day: 0.00    Average packs/day: 20.0 packs/day for 1 year (20.0 ttl pk-yrs)    Types: Cigarettes    Start date: 06/18/1998    Quit date: 06/19/1999    Years since quitting: 24.1   Smokeless tobacco: Never   Tobacco comments:    20 year estimate but probably less   Vaping Use   Vaping status: Never Used  Substance Use Topics   Alcohol use: Yes    Alcohol/week: 0.0 standard drinks of alcohol    Comment: occ   Drug use: No     Current Outpatient Medications:    albuterol  (PROVENTIL ) (2.5 MG/3ML) 0.083% nebulizer solution, Take 3 mLs (2.5 mg total) by nebulization every 6 (six) hours as needed for wheezing or shortness of breath., Disp: 75 mL, Rfl: 12   Albuterol  Sulfate (PROAIR  RESPICLICK) 108 (90 Base) MCG/ACT AEPB, Inhale 2 puffs into the lungs every 6 (six) hours as needed., Disp: 1 each, Rfl: 5   amoxicillin -clavulanate (AUGMENTIN ) 875-125 MG tablet, Take 1 tablet by mouth 2 (two) times daily., Disp: 20 tablet, Rfl: 0   Ascorbic Acid  (VITA-C PO), Take 1 tablet by mouth daily., Disp: , Rfl:    Azelastine  HCl 137 MCG/SPRAY SOLN, SPRAY 1 SPRAY IN EACH NOSTRIL TWICE DAILY AS NEEDED, Disp: 60 mL, Rfl: 1   busPIRone  (BUSPAR ) 15 MG tablet, Take 1 tablet (15 mg total) by mouth 3 (three) times daily., Disp: 300 tablet, Rfl: 1   cetirizine  (ZYRTEC ) 10 MG tablet, Take 1 tablet (10 mg total) by mouth daily., Disp: 90 tablet, Rfl: 1   Cholecalciferol (VITAMIN D -3 PO), Take 10,000 Units by mouth daily. , Disp: , Rfl:    clonazePAM  (KLONOPIN ) 0.5 MG tablet, TAKE 1 TABLET BY MOUTH 2 TIMES A DAY AND 2 AT  BEDTIME, Disp: 400 tablet, Rfl: 0   dicyclomine  (BENTYL ) 10 MG capsule, Take 1 capsule (10 mg total) by mouth 3 (three) times daily as needed for spasms., Disp: 90 capsule, Rfl: 2   dicyclomine  (BENTYL ) 20 MG tablet, TAKE ONE TABLET BY MOUTH FOUR TIMES A DAY AS NEEDED FOR SPASMS, Disp: 360 tablet, Rfl: 3   doxazosin  (CARDURA ) 4 MG tablet, Take 2 tablets (8 mg total) by mouth at bedtime., Disp: 200 tablet, Rfl: 1   Evolocumab  (REPATHA  SURECLICK) 140 MG/ML SOAJ, Inject 140 mg into the skin every 14 (fourteen) days., Disp: 6 mL, Rfl: 3   ezetimibe  (ZETIA ) 10 MG tablet, Take 1 tablet (10 mg total) by mouth daily.,  Disp: 90 tablet, Rfl: 3   famotidine  (PEPCID ) 20 MG tablet, Take 1 tablet (20 mg total) by mouth at bedtime., Disp: 90 tablet, Rfl: 3   fluticasone  (FLONASE ) 50 MCG/ACT nasal spray, Place 2 sprays into both nostrils in the morning and at bedtime., Disp: 96 mL, Rfl: 2   fluticasone -salmeterol (ADVAIR) 250-50 MCG/ACT AEPB, Inhale 1 puff into the lungs every 12 (twelve) hours., Disp: 180 each, Rfl: 1   Menaquinone-7 (VITAMIN K2) 100 MCG CAPS, Take 1 capsule by mouth daily., Disp: , Rfl:    mirtazapine  (REMERON ) 45 MG tablet, Take 1 tablet (45 mg total) by mouth at bedtime., Disp: 100 tablet, Rfl: 1   montelukast  (SINGULAIR ) 10 MG tablet, Take 1 tablet (10 mg total) by mouth at bedtime., Disp: 100 tablet, Rfl: 2   Multiple Vitamin (MULTIVITAMIN WITH MINERALS) TABS tablet, Take 1 tablet by mouth daily., Disp: , Rfl:    Omega-3 Fatty Acids (OMEGA 3 PO), Take 1 capsule by mouth daily., Disp: , Rfl:    pantoprazole  (PROTONIX ) 40 MG tablet, Take 1 tablet (40 mg total) by mouth daily., Disp: 90 tablet, Rfl: 3   predniSONE  (DELTASONE ) 20 MG tablet, Take 2 tablets (40 mg total) by mouth daily with breakfast., Disp: 10 tablet, Rfl: 0   sodium chloride  (OCEAN) 0.65 % SOLN nasal spray, Place 1 spray into both nostrils as needed for congestion., Disp: , Rfl:    SUTAB  [phone] MG TABS, Take by mouth.,  Disp: , Rfl:   Allergies  Allergen Reactions   Aspirin Other (See Comments)    discomfort   Atorvastatin      Patient states she felt suicidal   Codeine Itching   Crestor  [Rosuvastatin ]     Dizzy, leg pain and states she felt suicidal   Gabapentin     Niacin And Related Itching   Sulfa Antibiotics     Other reaction(s): Unknown   Sulfamethoxazole-Trimethoprim Other (See Comments)    Unknown reaction per pt   Topiramate  Other (See Comments)    Per patient, it causes stomach burning   Trileptal  [Oxcarbazepine ]     itching   Trimethoprim Other (See Comments)    Unknown, per pt   Ultram [Tramadol] Itching   Vioxx [Rofecoxib]     Unknown per pt    ROS: See pertinent positives and negatives per HPI.  OBSERVATIONS/OBJECTIVE:  VITALS per patient if applicable: Today's Vitals   07/29/23 0924  Temp: (!) 101 F (38.3 C)  TempSrc: Temporal  Weight: 188 lb (85.3 kg)  Height: 5' 0.75" (1.543 m)   Body mass index is 35.82 kg/m.    GENERAL: Alert and oriented. Appears ill, holding head, and in no acute distress.  HEENT: Atraumatic. Conjunctiva clear. No obvious abnormalities on inspection of external nose and ears.  NECK: Normal movements of the head and neck.  LUNGS: On inspection, no signs of respiratory distress. Breathing rate appears normal. No obvious gross SOB, gasping or wheezing, and no conversational dyspnea.  CV: No obvious cyanosis.  MS: Moves all visible extremities without noticeable abnormality.  PSYCH/NEURO: Pleasant and cooperative. No obvious depression or anxiety. Speech and thought processing grossly intact.  ASSESSMENT AND PLAN:  Problem List Items Addressed This Visit   None Visit Diagnoses       Upper respiratory tract infection, unspecified type    -  Primary   With h/o COPD, will start augmentin  BID x10 days. Home Covid/flu test neg. Encourage fluids, rest. Cont. inhalers. Start prednsione 40mg  daily.  I discussed the assessment and  treatment plan with the patient. The patient was provided an opportunity to ask questions and all were answered. The patient agreed with the plan and demonstrated an understanding of the instructions.   The patient was advised to call back or seek an in-person evaluation if the symptoms worsen or if the condition fails to improve as anticipated.   Odette Benjamin, NP

## 2023-07-29 NOTE — Telephone Encounter (Signed)
 Copied from CRM [id number]. Topic: Clinical - Red Word Triage >> Jul 29, 2023  8:11 AM Brittany [name] wrote: Red Word that prompted transfer to Nurse Triage: Pt stated that she is feeling light headed, dizzy, fever, pressure, body chills and sharp pain in her ear and has tightness in her sinus area.    Chief Complaint: Lightheaded and headache Symptoms: lightheaded, fever hovering around 101 Frequency: since Saturday Pertinent Negatives: Patient denies SOB, chest pain, difficulty breathing, spinning feeling Disposition: [] ED /[] Urgent Care (no appt availability in office) / [] Appointment(In office/virtual)/ [x]  [facility]/ [] Home Care/ [] Refused Recommended Disposition /[] [facility]/ []  Follow-up with PCP Additional Notes: Patient has had a fever and lightheadedness since Saturday, with a headache. She has taken Tylenol  starting yesterday but nothing else as she has 2 proceedures coming up and was hesitant to even take the Tylenol . Trying to drink lots of fluids to remain hydrated. Wants to get well before daughter's mastectomy surgery on Thursday. Patient has been using her cane to get around, dizziness on standing up. Denies feeling like she will pass out. Patient advised to get to UC yesterday but does not feel she can drive and has no one to drive her. She is unable to afford an [employer] to cab to get to UC or office and does not want ED as it is a 'petri dish', patient given Virtual UC appt today as her symptoms have remained steady for a couple of days for 1045 and advised patient to call back if symptoms worsen.   Reason for Disposition  [1] Fever > 101 F (38.3 C) AND [2] age > 60 years  Answer Assessment - Initial Assessment Questions 1. DESCRIPTION: "Describe your dizziness."     "Trouble moving around" 2. LIGHTHEADED: "Do you feel lightheaded?" (e.g., somewhat faint, woozy, weak upon standing)     "More lightheaded-using cane around apartment" 3. VERTIGO: "Do you feel  like either you or the room is spinning or tilting?" (i.e. vertigo)     no 4. SEVERITY: "How bad is it?"  "Do you feel like you are going to faint?" "Can you stand and walk?"   - MILD: Feels slightly dizzy, but walking normally.   - MODERATE: Feels unsteady when walking, but not falling; interferes with normal activities (e.g., school, work).   - SEVERE: Unable to walk without falling, or requires assistance to walk without falling; feels like passing out now.      Moderate-using cane to get around 5. ONSET:  "When did the dizziness begin?"     Saturday 6. AGGRAVATING FACTORS: "Does anything make it worse?" (e.g., standing, change in head position)     Standing up and moving around 7. HEART RATE: "Can you tell me your heart rate?" "How many beats in 15 seconds?"  (Note: not all patients can do this)       I dont know 8. CAUSE: "What do you think is causing the dizziness?"     Maybe sinus infection 9. RECURRENT SYMPTOM: "Have you had dizziness before?" If Yes, ask: "When was the last time?" "What happened that time?"     A couple years ago 10. OTHER SYMPTOMS: "Do you have any other symptoms?" (e.g., fever, chest pain, vomiting, diarrhea, bleeding)       Fever, headace 11. PREGNANCY: "Is there any chance you are pregnant?" "When was your last menstrual period?"       N/o  Protocols used: Dizziness - Lightheadedness-A-AH

## 2023-07-29 NOTE — Telephone Encounter (Signed)
 Noted- ok to close.

## 2023-07-29 NOTE — Telephone Encounter (Signed)
 Patient rescheduled for next available procedure  Thank you

## 2023-08-01 ENCOUNTER — Encounter: Payer: HMO | Admitting: Internal Medicine

## 2023-08-01 ENCOUNTER — Ambulatory Visit: Payer: HMO | Admitting: Physician Assistant

## 2023-08-02 ENCOUNTER — Ambulatory Visit: Payer: HMO | Admitting: Psychiatry

## 2023-08-02 DIAGNOSIS — F3162 Bipolar disorder, current episode mixed, moderate: Secondary | ICD-10-CM

## 2023-08-02 NOTE — Progress Notes (Signed)
[facility] Counselor/Therapist Progress Note  Patient ID: Brittany [name], MRN: [id number],    Date: 08/02/2023  Time Spent: 53 minutes   Treatment Type: Individual Therapy  Reported Symptoms: anxiety, depression, overthinking, loneliness "ups and downs", food for comfort is some better, impulsivity improving    Mental Status Exam:  Appearance:   Casual and Neat     Behavior:  Appropriate, Sharing, and Motivated  Motor:  Normal  Speech/Language:   Clear and Coherent  Affect:  Depressed and anxious  Mood:  anxious and depressed  Thought process:  goal directed  Thought content:    Rumination  Sensory/Perceptual disturbances:    WNL  Orientation:  oriented to person, place, time/date, situation, day of week, month of year, year, and stated date of Feb. 14, 2025  Attention:  Fair  Concentration:  Good and Fair  Memory:  WNL   Fund of knowledge:   Good  Insight:    Fair  Judgment:   Good and Fair  Impulse Control:  Fair   Risk Assessment: Danger to Self:  No Self-injurious Behavior: No Danger to Others: No Duty to Warn:no Physical Aggression / Violence:No  Access to Firearms a concern: No  Gang Involvement:No   Subjective:    Patient today reporting in session continued anxiety, overthinking, depression, difficult relationships and issues within the family and sometimes in friendships. Had been "sick earlier in the week but got antibiotic and prednisone and has gotten better." Patient acknowledges that she is often quick to make assumptions that people feel a certain negative way about her without really having strong evidence to back that up.  Discussed some of this in session today and encouraged patient not to rush to judgment on what she might think others think, and look at some ways of relating to people or in group situations that might help her to feel that she "fits in" better, and to also be more accepting of herself in the process. Continued difficulty at time  with her insight and making good choices. Difficulty not rushing to judgement in situations. Encouraged to limit herself more on facebook as she has reported it ends up being "very negative" for her. Checked in with patient on some points she discussed last session. Due to being sick more recently she was not able to follow through with going to a gym she had signed up for, nor had been able to read again just for pleasure. Shared more situations where she feels badly about something and makes assumptions without knowing the full situation, which has happened a few times since last appt and created negative feelings for patient.  Worked some on this today with specific examples and seemed helpful for patient. Further work on "control" issues and "I see the same thing in my daughter and grand-daughter. Working to decrease negative thinking.Overall trying to take better "over-all" care of herself and be in more contact with people who are healthy for her.   Interventions: Cognitive Behavioral Therapy and Ego-Supportive  1) Long Term Goal: Develop and follow through on strategies to reduce symptoms.     Short Term Goal: Reduce anxiety and improve coping skills.     Objective: Learn two new ways of coping with daily stressors.     Objective: Develop strategies for thought distraction when fixating on the future.     Objective: Manage panic episodes. 2) Long Term Goal: Maintain stability of mood     Short Term Goal: Increase ability to manage  moods.     Objective: Discuss and resolve troubling personal and interpersonal issues.   Diagnosis:   ICD-10-CM   1. Bipolar 1 disorder, mixed, moderate (HCC)  F31.62      Plan:   Patient participating well in session today focusing more on trying to have a healthier mindset including her self-esteem, better management of her finances, staying involved in activities and an outside of the home and maintaining some connections with people that are healthy for her.   Encouraged to continue work on better choices, eventual letting go of hurts from the past, stopped making negative assumptions about others and herself, better money management, positive/encouraging self talk, remain involved in activities that nurture and help her to maintain stability in her mood, refrain from identifying herself as a failure, refrain from getting overly fixated on the future, continue working on strategies regarding thought distractions, stay in the present versus the past, emphasize her positives, practice "the pause" as needed, let her faith be a resource for emotional stability as well as spiritual, understand that her anxious thoughts are "just thoughts" and are not necessarily going to play out in reality the way she might fear or assume, continue efforts to make better choices especially in setting limits and in her own self-care, and realize the strength she shows when working with goal-directed behaviors to move in a direction of improved emotional health, hopefulness, and in her overall wellbeing.  This patient has made some progress, with some ups and downs, and is committed to continuing her work with goal-directed behaviors, so as to make more progress and moving towards a more hopeful and healthier direction.  Goal review and progress/challenges noted with patient.  Next appointment within 2 weeks.   Mathis Fare, LCSW

## 2023-08-16 ENCOUNTER — Ambulatory Visit: Payer: HMO | Admitting: Psychiatry

## 2023-08-16 DIAGNOSIS — F3162 Bipolar disorder, current episode mixed, moderate: Secondary | ICD-10-CM | POA: Diagnosis not present

## 2023-08-16 NOTE — Progress Notes (Signed)
 [facility] Counselor/Therapist Progress Note  Patient ID: Brittany [name], MRN: [id number],    Date: 08/16/2023  Time Spent: 53 minutes   Treatment Type: Individual Therapy  Reported Symptoms: unmotivated, anxiety, some depression/emotionally drained, some loneliness ups and downs, impulsivity improving, "food for comfort" is some better. Stated "October-March are worse months due mostly to holiday times".   Mental Status Exam:  Appearance:   Casual     Behavior:  Appropriate and Sharing  Motor:  Brace on right knee/leg  Speech/Language:   Clear and Coherent  Affect:  Depressed and anxious  Mood:  anxious and some depression  Thought process:  goal directed  Thought content:    Some ruminating  Sensory/Perceptual disturbances:    WNL  Orientation:  oriented to person, place, time/date, situation, day of week, month of year, year, and stated date of Feb. 2025.  Attention:  Fair  Concentration:  Good and Fair  Memory:  WNL  Fund of knowledge:   Good  Insight:    Fair and Poor  Judgment:   Good and Fair  Impulse Control:  "Getting better recently"   Risk Assessment: Danger to Self:  No Self-injurious Behavior: No Danger to Others: No Duty to Warn:no Physical Aggression / Violence:No  Access to Firearms a concern: No  Gang Involvement:No   Subjective:  Patient today reporting anxiety, some depression, challenges in some family and other relationships. Further work today on these symptoms and particularly within her family where she is trying to better manage hurt, some anger, feeling "missed" by others at times. Some loneliness and is working on this today and also looking at ways to connect more; plans to visit a church soon and spoke about this in session more.  More discussion today on improving her coping skills, reducing anxiety, not jumping to conclusions about what others may be thinking, looking more for positives than negatives especially when going into certain  situations involving other people, strategies for having more stability in her mood, and being able to manage her moods more effectively which she has at times made some progress on per her report.  Working to have better insight and Lawyer.  Continue to encourage her to participate in activities outside the home as she is able.  Continue to encourage patient to limit herself on Facebook mainly because she has self-reported on several occasions about how she would end up afterwards feeling more depressed or negative.  Is feeling better physically except still had some cough today.  Seems to be getting becoming more aware at least on some occasions of how easy it is for her to assume the worst and the situation or things end up not really "being the worst".  Encouraging her to hang onto some of this insight and be able to use it in helpful ways especially as she tries to get out and be around other people more.  Continue to work on her "control issues" as that impacts her relationships.  Interventions: Cognitive Behavioral Therapy and Ego-Supportive 1) Long Term Goal: Develop and follow through on strategies to reduce symptoms.     Short Term Goal: Reduce anxiety and improve coping skills.     Objective: Learn two new ways of coping with daily stressors.     Objective: Develop strategies for thought distraction when fixating on the future.     Objective: Manage panic episodes. 2) Long Term Goal: Maintain stability of mood     Short Term Goal:  Increase ability to manage moods.     Objective: Discuss and resolve troubling personal and interpersonal issues.   Diagnosis:   ICD-10-CM   1. Bipolar 1 disorder, mixed, moderate (HCC)  F31.62      Plan:   Patient today showing good participation working more on her self-esteem, focusing on some of her positives more specifically, trying to have a healthier mindset, managing her finances more effectively, and remaining involved in  activities inside and outside of the home while maintaining some connections with people that are healthy for her. Reminded and encouraged patient to continue working on making better choices, eventually letting go of hurts from the past, stopped making negative assumptions about others and herself, better money management, positive/encouraging self talk, remain involved in activities that nurture and help her to maintain stability in her mood, refrain from identifying herself as a failure, refrain from getting overly fixated on the future, continue work on strategies regarding thought distractions, stay in the present versus the past, emphasize her positives, practice "the pause" as needed, let her faith be a resource for emotional stability as well as spiritual, understand that her anxious thoughts are "just thoughts" and are not necessarily going to play out in reality the way she might fear or assume, continue efforts to make better choices especially in setting limits and in her own self-care, and recognize the strength she shows when working with goal-directed behaviors to move in a direction of improved emotional health, a sense of hopefulness, and in her overall wellbeing.  She has definitely made some progress and needs to continue her work with goal-directed behaviors to keep moving forward in a hopeful and healthier direction.  Goal review and progress/challenges noted with patient.  Next appointment within 2 weeks.   Mathis Fare, LCSW

## 2023-08-19 ENCOUNTER — Other Ambulatory Visit: Payer: Self-pay | Admitting: Pulmonary Disease

## 2023-08-30 ENCOUNTER — Ambulatory Visit: Payer: HMO | Admitting: Psychiatry

## 2023-08-30 ENCOUNTER — Ambulatory Visit: Admitting: Psychiatry

## 2023-08-30 DIAGNOSIS — F3162 Bipolar disorder, current episode mixed, moderate: Secondary | ICD-10-CM

## 2023-08-30 NOTE — Progress Notes (Signed)
 [facility] Counselor/Therapist Progress Note  Patient ID: Brittany [name], MRN: [id number],    Date: 08/30/2023  Time Spent: 53 minutes   Treatment Type: Individual Therapy  Reported Symptoms: anxiety, some motivational issues, some depression, some loneliness ups and downs, some improvement in impulsivity, feels Oct-March are worse months due to holiday times   Mental Status Exam:  Appearance:   Casual     Behavior:  Appropriate, Sharing, and Motivated  Motor:  Normal  Speech/Language:   Clear and Coherent  Affect:  Anxious, some depression  Mood:  anxious and depressed  Thought process:  goal directed  Thought content:    Some rumination at night  Sensory/Perceptual disturbances:    WNL  Orientation:  oriented to person, place, time/date, situation, day of week, month of year, year, and stated date of August 30, 2023  Attention:  Fair  Concentration:  Fair  Memory:  WNL  Fund of knowledge:   Good  Insight:    Good and Fair  Judgment:   Good and Fair  Impulse Control:  Good and Fair   Risk Assessment: Danger to Self:  No Self-injurious Behavior: No Danger to Others: No Duty to Warn:no Physical Aggression / Violence:No  Access to Firearms a concern: No  Gang Involvement:No   Subjective:  Patient in session today working further on her anxiety, depression, family relationship challenges, some anger, trying to see more positives than negatives in herself and others, and trying to improve her overall emotional and financial health and working on Pension scheme manager.  Working to not make quick assumptions regarding others within her family and beyond, and trying to better manage her own hurt and anger, and the feelings of being "missed" by others at times.  Continues to experience some "aloneness" and looked at some of her ways of interacting with others but hard for patient to realize how some of the ways that we talk and interact with others can likely affect their  level of contact with [place], based on some of the recollections of her interactions with other people and how she felt she did not get the attention she needed/wanted them.  Will continue to work on this with patient and more hopeful ways, trying to emphasize her strengths to help motivate her more.  Continue to work on improving her coping skills, not jumping to conclusions about what others may be thinking, looking for more positives versus negatives when going into certain situations involving others, trying to manage her moods more effectively  especially when things do not go as planned or hoped for, and strategies for having more mood stability.  She reports continued efforts to make better/healthier choices, and better insight is discussed (with examples) and encouraged in sessions.  Because of patient's self-report of feeling more negative and depressed "in my mood" after being on Facebook, therapist continues to encourage her to cut back on the amount of time she spends on that site. Hard to follow through on recommendations from PCP and cardiologist and process her making negative choices for herself. Worked with patient on her motivation and ways that may help her improve it, and she responded well. Acknowledges that she "wants change fast" and working with patient to appreciate even small changes which can add up to make bigger changes in her life over time. Continues to work on her "control issues" which is challenging for her.  Encouraging her to be more open to the positives and refrain from assuming the worst.  Interventions: Cognitive Behavioral Therapy and Ego-Supportive  ) Long Term Goal: Develop and follow through on strategies to reduce symptoms.     Short Term Goal: Reduce anxiety and improve coping skills.     Objective: Learn two new ways of coping with daily stressors.     Objective: Develop strategies for thought distraction when fixating on the future.     Objective: Manage panic  episodes. 2) Long Term Goal: Maintain stability of mood     Short Term Goal: Increase ability to manage moods.     Objective: Discuss and resolve troubling personal and interpersonal issues.  Diagnosis:   ICD-10-CM   1. Bipolar 1 disorder, mixed, moderate (HCC)  F31.62      Plan:   Patient participating well in session today as we focused more on her self-esteem, depression, anxiety, seeing more positives versus negatives, family relationship challenges, managing her finances more effectively as this impacts her emotional health in addition to financial health, using good judgment, and staying involved in activities inside and outside of the home and maintaining connections with people that are healthy for her.  Focus more heavily on not judging situations, people, or herself.  Encouraged patient to continue working on making better choices, eventually letting go of hurts from the past, refrain from making negative assumptions about others and herself, better money management, positive/encouraging self talk, remain involved in activities that nurture and help her to maintain stability in her mood, refrain from identifying herself as a failure, refrain from getting overly fixated on the future, continue work on strategies regarding thought distractions, stay in the present versus the past, emphasize her positives, practice "the pause" as needed, let her faith be a resource for emotional stability as well as spiritual, understand that her anxious thoughts are "just thoughts" and are not necessarily going to play out in reality the way she might fear or assume, continue her efforts to make better choices especially in limit setting and in her own self-care, and realize the strength she can show when working with goal-directed behaviors to move in a direction of improved emotional health, increase a sense of hopefulness, and in her overall wellbeing.  Patient has made some progress and needs to continue her  work with goal-directed behaviors to move and a more hopeful and healthier direction.   Goal review and progress/challenges noted with patient.  Next appointment within 2 weeks.   Mathis Fare, LCSW

## 2023-09-03 ENCOUNTER — Telehealth: Payer: Self-pay | Admitting: Cardiovascular Disease

## 2023-09-03 DIAGNOSIS — E785 Hyperlipidemia, unspecified: Secondary | ICD-10-CM | POA: Diagnosis not present

## 2023-09-03 NOTE — Telephone Encounter (Signed)
 New message   Paper Work Dropped Off:   Date:09-03-23  Location of paper:  Patient assistance forms for repatha put in Dr Fabio Bering box.

## 2023-09-04 LAB — LIPID PANEL
Chol/HDL Ratio: 2 ratio (ref 0.0–4.4)
Cholesterol, Total: 140 mg/dL (ref 100–199)
HDL: 70 mg/dL (ref >39)
LDL Chol Calc (NIH): 56 mg/dL (ref 0–99)
Triglycerides: 67 mg/dL (ref 0–149)
VLDL Cholesterol Cal: 14 mg/dL (ref 5–40)

## 2023-09-10 ENCOUNTER — Telehealth: Payer: Self-pay | Admitting: Pharmacy Technician

## 2023-09-10 ENCOUNTER — Encounter: Payer: Self-pay | Admitting: Pharmacy Technician

## 2023-09-10 ENCOUNTER — Other Ambulatory Visit (HOSPITAL_COMMUNITY): Payer: Self-pay

## 2023-09-10 NOTE — Telephone Encounter (Signed)
 Hi, I got the grant but the patient insurance is requiring a prior authorization so I submitted for a prior authorization. Can you/or someone send a prescription for repatha to her preferred pharmacy of   [facility] [id number] [place], [place] - [address] [place] [address] Phone: [phone] Fax: [phone]  I will call them and give them the grant and I will watch for the prior auth. I spoke to the patient and she is aware of all of this

## 2023-09-10 NOTE — Telephone Encounter (Signed)
 Patient Advocate Encounter   The patient was approved for a [employer] grant that will help cover the cost of repatha Total amount awarded, 2,500.00.  Effective: 08/11/23 - 08/09/24   ZOX:[id number] WUJ:[id number] WGNFA:[id number] QI:[id number] [employer] ID: [id number]   Pharmacy provided with approval and processing information. Patient informed via telephone

## 2023-09-10 NOTE — Telephone Encounter (Signed)
 Pt is calling check status of this message. She is now out of medication

## 2023-09-10 NOTE — Telephone Encounter (Signed)
 Pharmacy Patient Advocate Encounter   Received notification from Pt Calls Messages that prior authorization for repatha is required/requested.   Insurance verification completed.   The patient is insured through [facility] ADVANTAGE/RX ADVANCE .   Per test claim: PA required; PA submitted to above mentioned insurance via CoverMyMeds Key/confirmation #/[id number] Status is pending

## 2023-09-10 NOTE — Telephone Encounter (Signed)
 Called patient back about message. Informed her that our pharmacist or someone from our pharmacy department will call her back about qualifying for a grant. Patient verbalized understanding.

## 2023-09-11 ENCOUNTER — Telehealth: Payer: Self-pay | Admitting: *Deleted

## 2023-09-11 MED ORDER — REPATHA SURECLICK 140 MG/ML ~~LOC~~ SOAJ: 140.0000 mg | SUBCUTANEOUS | 3 refills | Status: DC

## 2023-09-11 NOTE — Telephone Encounter (Signed)
 Pharmacy Patient Advocate Encounter  Received notification from [facility] ADVANTAGE/RX ADVANCE that Prior Authorization for repatha has been APPROVED from 09/11/23 to 09/10/24     I gave [employer] grant information so now copay is free, patient aware

## 2023-09-11 NOTE — Telephone Encounter (Signed)
 Pt called stating she did not receive updated instructions for procedure tomorrow. New instructions sent via MyChart. Called pt to make her aware. All questions answered. Pt verbalized understanding.

## 2023-09-12 ENCOUNTER — Encounter: Payer: Self-pay | Admitting: Internal Medicine

## 2023-09-12 ENCOUNTER — Ambulatory Visit: Payer: HMO | Admitting: Internal Medicine

## 2023-09-12 VITALS — BP 128/72 | HR 87 | Temp 97.4°F | Resp 21 | Ht 60.75 in | Wt 189.2 lb

## 2023-09-12 DIAGNOSIS — K229 Disease of esophagus, unspecified: Secondary | ICD-10-CM | POA: Diagnosis not present

## 2023-09-12 DIAGNOSIS — R194 Change in bowel habit: Secondary | ICD-10-CM | POA: Diagnosis not present

## 2023-09-12 DIAGNOSIS — K648 Other hemorrhoids: Secondary | ICD-10-CM | POA: Diagnosis not present

## 2023-09-12 DIAGNOSIS — D120 Benign neoplasm of cecum: Secondary | ICD-10-CM | POA: Diagnosis not present

## 2023-09-12 DIAGNOSIS — F319 Bipolar disorder, unspecified: Secondary | ICD-10-CM | POA: Diagnosis not present

## 2023-09-12 DIAGNOSIS — F419 Anxiety disorder, unspecified: Secondary | ICD-10-CM | POA: Diagnosis not present

## 2023-09-12 DIAGNOSIS — K3189 Other diseases of stomach and duodenum: Secondary | ICD-10-CM

## 2023-09-12 DIAGNOSIS — D369 Benign neoplasm, unspecified site: Secondary | ICD-10-CM

## 2023-09-12 DIAGNOSIS — Z1211 Encounter for screening for malignant neoplasm of colon: Secondary | ICD-10-CM

## 2023-09-12 DIAGNOSIS — E669 Obesity, unspecified: Secondary | ICD-10-CM | POA: Diagnosis not present

## 2023-09-12 DIAGNOSIS — K2970 Gastritis, unspecified, without bleeding: Secondary | ICD-10-CM

## 2023-09-12 DIAGNOSIS — I2510 Atherosclerotic heart disease of native coronary artery without angina pectoris: Secondary | ICD-10-CM | POA: Diagnosis not present

## 2023-09-12 DIAGNOSIS — K5730 Diverticulosis of large intestine without perforation or abscess without bleeding: Secondary | ICD-10-CM | POA: Diagnosis not present

## 2023-09-12 DIAGNOSIS — K222 Esophageal obstruction: Secondary | ICD-10-CM | POA: Diagnosis not present

## 2023-09-12 DIAGNOSIS — R197 Diarrhea, unspecified: Secondary | ICD-10-CM

## 2023-09-12 DIAGNOSIS — D122 Benign neoplasm of ascending colon: Secondary | ICD-10-CM

## 2023-09-12 DIAGNOSIS — D124 Benign neoplasm of descending colon: Secondary | ICD-10-CM | POA: Diagnosis not present

## 2023-09-12 DIAGNOSIS — K219 Gastro-esophageal reflux disease without esophagitis: Secondary | ICD-10-CM

## 2023-09-12 DIAGNOSIS — R1310 Dysphagia, unspecified: Secondary | ICD-10-CM | POA: Diagnosis not present

## 2023-09-12 DIAGNOSIS — K319 Disease of stomach and duodenum, unspecified: Secondary | ICD-10-CM | POA: Diagnosis not present

## 2023-09-12 DIAGNOSIS — K317 Polyp of stomach and duodenum: Secondary | ICD-10-CM

## 2023-09-12 MED ORDER — SODIUM CHLORIDE 0.9 % IV SOLN
500.0000 mL | Freq: Once | INTRAVENOUS | Status: DC
Start: 2023-09-12 — End: 2023-09-12

## 2023-09-12 NOTE — Progress Notes (Signed)
 Pt's states no medical or surgical changes since previsit or office visit.

## 2023-09-12 NOTE — Progress Notes (Signed)
 Report to PACU, RN, vss, BBS= Clear.

## 2023-09-12 NOTE — Progress Notes (Signed)
 GASTROENTEROLOGY PROCEDURE H&P NOTE   Primary Care Physician: Karie Georges, MD    Reason for Procedure:   Dysphagia, GERD, history of duodenal adenoma, colon cancer screening, diarrhea  Plan:    EGD/colonoscopy  Patient is appropriate for endoscopic procedure(s) in the ambulatory ([facility]) setting.  The nature of the procedure, as well as the risks, benefits, and alternatives were carefully and thoroughly reviewed with the patient. Ample time for discussion and questions allowed. The patient understood, was satisfied, and agreed to proceed.     HPI: Brittany [name] is a 68 y.o. female who presents for EGD/colonoscopy for evaluation of dysphagia, GERD, history of duodenal adenoma, colon cancer screening, diarrhea.  Patient was most recently seen in the Gastroenterology Clinic on 07/02/23.  No interval change in medical history since that appointment. Please refer to that note for full details regarding GI history and clinical presentation.   Past Medical History:  Diagnosis Date   Abnormal EKG    Allergy    Anxiety    Arthritis    Asthma    Bipolar depression (HCC), Anxiety, PTSD, Panic disorder    -managed by [facility]   CAD (coronary artery disease)    Depression    Foot pain    Gallstones    GERD (gastroesophageal reflux disease)    Heart murmur    as a child   HLD (hyperlipidemia)    Insomnia    Leg swelling    Obesity    Osteoporosis    osteopenia   Pneumonia due to COVID-19 virus 04/22/2019   Tachycardia     Past Surgical History:  Procedure Laterality Date   BREAST BIOPSY Right    biopsy of nipple   CARPAL TUNNEL RELEASE Left    CESAREAN SECTION     x 2   CHOLECYSTECTOMY     COLONOSCOPY     ENDOMETRIAL ABLATION     EXCISION MORTON'S NEUROMA Left 01/11/2022   Procedure: SECOND WEBSPACE MORTON'S NEUROMA EXCISION;  Surgeon: Toni Arthurs, MD;  Location: [facility];  Service: Orthopedics;  Laterality: Left;   FOOT SURGERY  Left    HAMMER TOE SURGERY Left 01/11/2022   Procedure: COLLATERAL LIGAMENT REPAIRS WITH SECOND AND THIRD HAMMER TOE CORRECTION;  Surgeon: Toni Arthurs, MD;  Location: [facility];  Service: Orthopedics;  Laterality: Left;   KNEE SURGERY Right    reconstruction for patellar dislocation   LAPAROSCOPY     x 2   TOENAIL EXCISION Left 01/11/2022   Procedure: HALLUX TOENAIL PERMANENT EXCISION;  Surgeon: Toni Arthurs, MD;  Location: [facility];  Service: Orthopedics;  Laterality: Left;   TUBAL LIGATION     ULNAR NERVE REPAIR Left    UPPER GASTROINTESTINAL ENDOSCOPY     WEIL OSTEOTOMY Left 01/11/2022   Procedure: SECOND, THIRD AND FOURTH WEIL OSTEOTOMIES;  Surgeon: Toni Arthurs, MD;  Location: [facility];  Service: Orthopedics;  Laterality: Left;    Prior to Admission medications   Medication Sig Start Date End Date Taking? Authorizing Provider  albuterol (PROVENTIL) (2.5 MG/3ML) 0.083% nebulizer solution Take 3 mLs (2.5 mg total) by nebulization every 6 (six) hours as needed for wheezing or shortness of breath. 11/15/22  Yes Martina Sinner, MD  Albuterol Sulfate (PROAIR RESPICLICK) 108 (90 Base) MCG/ACT AEPB Inhale 2 puffs into the lungs every 6 (six) hours as needed. 05/27/23  Yes Martina Sinner, MD  Ascorbic Acid (VITA-C PO) Take 1 tablet by mouth daily.  Yes [provider]  Azelastine HCl 137 MCG/SPRAY SOLN SPRAY 1 SPRAY IN EACH NOSTRIL TWICE DAILY AS NEEDED 12/07/22  Yes Martina Sinner, MD  busPIRone (BUSPAR) 15 MG tablet Take 1 tablet (15 mg total) by mouth 3 (three) times daily. 06/14/23  Yes Cottle, Steva Ready., MD  cetirizine (ZYRTEC) 10 MG tablet Take 1 tablet (10 mg total) by mouth daily. 07/20/19  Yes Steffanie Dunn, DO  Cholecalciferol (VITAMIN D-3 PO) Take 10,000 Units by mouth daily.    Yes [provider]  clonazePAM (KLONOPIN) 0.5 MG tablet TAKE 1 TABLET BY MOUTH 2 TIMES A DAY AND 2 AT BEDTIME 06/14/23  Yes  Cottle, Steva Ready., MD  dicyclomine (BENTYL) 10 MG capsule Take 1 capsule (10 mg total) by mouth 3 (three) times daily as needed for spasms. 07/02/23  Yes May, Deanna J, NP  dicyclomine (BENTYL) 20 MG tablet TAKE ONE TABLET BY MOUTH FOUR TIMES A DAY AS NEEDED FOR SPASMS 11/27/21  Yes Tressia Danas, MD  doxazosin (CARDURA) 4 MG tablet Take 2 tablets (8 mg total) by mouth at bedtime. 06/14/23  Yes Cottle, Steva Ready., MD  Evolocumab (REPATHA SURECLICK) 140 MG/ML SOAJ Inject 140 mg into the skin every 14 (fourteen) days. 09/11/23  Yes Wendall Stade, MD  ezetimibe (ZETIA) 10 MG tablet Take 1 tablet (10 mg total) by mouth daily. 12/18/22 09/12/23 Yes Wendall Stade, MD  famotidine (PEPCID) 20 MG tablet Take 1 tablet (20 mg total) by mouth at bedtime. 07/02/23  Yes May, Deanna J, NP  fluticasone (FLONASE) 50 MCG/ACT nasal spray USE 2 SPRAYS INTO BOTH NOSTRILS TWICE DAILY 08/19/23  Yes Martina Sinner, MD  fluticasone-salmeterol (ADVAIR) 250-50 MCG/ACT AEPB Inhale 1 puff into the lungs every 12 (twelve) hours. 05/27/23  Yes Martina Sinner, MD  Menaquinone-7 (VITAMIN K2) 100 MCG CAPS Take 1 capsule by mouth daily. 06/19/23  Yes [provider]  mirtazapine (REMERON) 45 MG tablet Take 1 tablet (45 mg total) by mouth at bedtime. 06/14/23  Yes Cottle, Steva Ready., MD  montelukast (SINGULAIR) 10 MG tablet Take 1 tablet (10 mg total) by mouth at bedtime. 11/15/22  Yes Martina Sinner, MD  Omega-3 Fatty Acids (OMEGA 3 PO) Take 1 capsule by mouth daily. 06/19/23  Yes [provider]  pantoprazole (PROTONIX) 40 MG tablet Take 1 tablet (40 mg total) by mouth daily. 10/23/22  Yes Karie Georges, MD  sodium chloride (OCEAN) 0.65 % SOLN nasal spray Place 1 spray into both nostrils as needed for congestion.   Yes [provider]  Multiple Vitamin (MULTIVITAMIN WITH MINERALS) TABS tablet Take 1 tablet by mouth daily.    [provider]    Current Outpatient Medications   Medication Sig Dispense Refill   albuterol (PROVENTIL) (2.5 MG/3ML) 0.083% nebulizer solution Take 3 mLs (2.5 mg total) by nebulization every 6 (six) hours as needed for wheezing or shortness of breath. 75 mL 12   Albuterol Sulfate (PROAIR RESPICLICK) 108 (90 Base) MCG/ACT AEPB Inhale 2 puffs into the lungs every 6 (six) hours as needed. 1 each 5   Ascorbic Acid (VITA-C PO) Take 1 tablet by mouth daily.     Azelastine HCl 137 MCG/SPRAY SOLN SPRAY 1 SPRAY IN EACH NOSTRIL TWICE DAILY AS NEEDED 60 mL 1   busPIRone (BUSPAR) 15 MG tablet Take 1 tablet (15 mg total) by mouth 3 (three) times daily. 300 tablet 1   cetirizine (ZYRTEC) 10 MG tablet Take 1  tablet (10 mg total) by mouth daily. 90 tablet 1   Cholecalciferol (VITAMIN D-3 PO) Take 10,000 Units by mouth daily.      clonazePAM (KLONOPIN) 0.5 MG tablet TAKE 1 TABLET BY MOUTH 2 TIMES A DAY AND 2 AT BEDTIME 400 tablet 0   dicyclomine (BENTYL) 10 MG capsule Take 1 capsule (10 mg total) by mouth 3 (three) times daily as needed for spasms. 90 capsule 2   dicyclomine (BENTYL) 20 MG tablet TAKE ONE TABLET BY MOUTH FOUR TIMES A DAY AS NEEDED FOR SPASMS 360 tablet 3   doxazosin (CARDURA) 4 MG tablet Take 2 tablets (8 mg total) by mouth at bedtime. 200 tablet 1   Evolocumab (REPATHA SURECLICK) 140 MG/ML SOAJ Inject 140 mg into the skin every 14 (fourteen) days. 6 mL 3   ezetimibe (ZETIA) 10 MG tablet Take 1 tablet (10 mg total) by mouth daily. 90 tablet 3   famotidine (PEPCID) 20 MG tablet Take 1 tablet (20 mg total) by mouth at bedtime. 90 tablet 3   fluticasone (FLONASE) 50 MCG/ACT nasal spray USE 2 SPRAYS INTO BOTH NOSTRILS TWICE DAILY 96 mL 2   fluticasone-salmeterol (ADVAIR) 250-50 MCG/ACT AEPB Inhale 1 puff into the lungs every 12 (twelve) hours. 180 each 1   Menaquinone-7 (VITAMIN K2) 100 MCG CAPS Take 1 capsule by mouth daily.     mirtazapine (REMERON) 45 MG tablet Take 1 tablet (45 mg total) by mouth at bedtime. 100 tablet 1   montelukast  (SINGULAIR) 10 MG tablet Take 1 tablet (10 mg total) by mouth at bedtime. 100 tablet 2   Omega-3 Fatty Acids (OMEGA 3 PO) Take 1 capsule by mouth daily.     pantoprazole (PROTONIX) 40 MG tablet Take 1 tablet (40 mg total) by mouth daily. 90 tablet 3   sodium chloride (OCEAN) 0.65 % SOLN nasal spray Place 1 spray into both nostrils as needed for congestion.     Multiple Vitamin (MULTIVITAMIN WITH MINERALS) TABS tablet Take 1 tablet by mouth daily.     Current Facility-Administered Medications  Medication Dose Route Frequency Provider Last Rate Last Admin   0.9 %  sodium chloride infusion  500 mL Intravenous Once Imogene Burn, MD        Allergies as of 09/12/2023 - Review Complete 09/12/2023  Allergen Reaction Noted   Aspirin Other (See Comments)    Atorvastatin Other (See Comments) 01/04/2022   Codeine Itching 09/26/2017   Crestor [rosuvastatin] Other (See Comments) 08/16/2021   Gabapentin Other (See Comments) 09/04/2021   Niacin and related Itching 01/11/2021   Sulfa antibiotics Other (See Comments) 03/02/2021   Sulfamethoxazole-trimethoprim Other (See Comments)    Topiramate Other (See Comments) 10/25/2020   Trileptal [oxcarbazepine] Itching 11/28/2018   Trimethoprim Other (See Comments) 11/12/2014   Ultram [tramadol] Itching 09/26/2017   Vioxx [rofecoxib] Other (See Comments)     Family History  Problem Relation Age of Onset   Hypertension Mother    Hyperlipidemia Mother    Congestive Heart Failure Mother    Other Father        killed   COPD Brother    Heart disease Brother    Pulmonary embolism Brother    Colon polyps Maternal Aunt    COPD Paternal Aunt        a lot of aunts and uncle COPD or Emphysema   Emphysema Paternal Uncle    Breast cancer Daughter    Colon cancer Neg Hx    Esophageal cancer Neg Hx  Pancreatic cancer Neg Hx    Stomach cancer Neg Hx    Liver disease Neg Hx    Rectal cancer Neg Hx     Social History   Socioeconomic History   Marital  status: Single    Spouse name: Not on file   Number of children: 3   Years of education: Not on file   Highest education level: Associate degree: occupational, Scientist, product/process development, or vocational program  Occupational History   Occupation: disabled  Tobacco Use   Smoking status: Former    Current packs/day: 0.00    Average packs/day: 20.0 packs/day for 1 year (20.0 ttl pk-yrs)    Types: Cigarettes    Start date: 06/18/1998    Quit date: 06/19/1999    Years since quitting: 24.2   Smokeless tobacco: Never   Tobacco comments:    20 year estimate but probably less   Vaping Use   Vaping status: Never Used  Substance and Sexual Activity   Alcohol use: Yes    Alcohol/week: 0.0 standard drinks of alcohol    Comment: occ   Drug use: No   Sexual activity: Never  Other Topics Concern   Not on file  Social History Narrative   Work or School: Disabled seconndary to psychiatric conditions      Home Situation: lives alone with 2 cats and one dog      Spiritual Beliefs: Christian      Lifestyle: no regular exercise, diet  Not great - wants to embark on healthier lifestyle   Social Drivers of Health   Financial Resource Strain: High Risk (04/24/2023)   Overall Financial Resource Strain (CARDIA)    Difficulty of Paying Living Expenses: Hard  Food Insecurity: Food Insecurity Present (04/24/2023)   Hunger Vital Sign    Worried About Running Out of Food in the Last Year: Often true    Ran Out of Food in the Last Year: Often true  Transportation Needs: Unmet Transportation Needs (04/24/2023)   PRAPARE - Administrator, Civil Service (Medical): Yes    Lack of Transportation (Non-Medical): Yes  Physical Activity: Sufficiently Active (04/24/2023)   Exercise Vital Sign    Days of Exercise per Week: 5 days    Minutes of Exercise per Session: 30 min  Stress: Stress Concern Present (04/24/2023)   [employer] of Occupational Health - Occupational Stress Questionnaire    Feeling of Stress : To  some extent  Social Connections: Moderately Isolated (04/24/2023)   Social Connection and Isolation Panel [NHANES]    Frequency of Communication with Friends and Family: Once a week    Frequency of Social Gatherings with Friends and Family: Once a week    Attends Religious Services: More than 4 times per year    Active Member of [employer] or Organizations: No    Attends Engineer, structural: More than 4 times per year    Marital Status: Divorced  Catering manager Violence: Not At Risk (05/24/2022)   Humiliation, Afraid, Rape, and Kick questionnaire    Fear of Current or Ex-Partner: No    Emotionally Abused: No    Physically Abused: No    Sexually Abused: No    Physical Exam: Vital signs in last 24 hours: BP 132/83   Pulse 86   Temp (!) 97.4 F (36.3 C) (Skin)   Ht 5' 0.75" (1.543 m)   Wt 189 lb 3.2 oz (85.8 kg)   SpO2 94%   BMI 36.04 kg/m  GEN: NAD EYE: Sclerae  anicteric ENT: MMM CV: Non-tachycardic Pulm: No increased WOB GI: Soft NEURO:  Alert & Oriented   Eulah Pont, MD [facility]   09/12/2023 11:03 AM

## 2023-09-12 NOTE — Op Note (Signed)
  [facility] Patient Name: Brittany [name] Procedure Date: 09/12/2023 11:10 AM MRN: [id number] Endoscopist: Particia Lather , , [id number] Age: 68 Referring MD:  Date of Birth: 03-25-1956 Gender: Female Account #: [id number] Procedure:                Upper GI endoscopy Indications:              Dysphagia, Heartburn, Follow-up of polyps in the                            duodenum Medicines:                Monitored Anesthesia Care Procedure:                Pre-Anesthesia Assessment:                           - Prior to the procedure, a History and Physical                            was performed, and patient medications and                            allergies were reviewed. The patient's tolerance of                            previous anesthesia was also reviewed. The risks                            and benefits of the procedure and the sedation                            options and risks were discussed with the patient.                            All questions were answered, and informed consent                            was obtained. Prior Anticoagulants: The patient has                            taken no anticoagulant or antiplatelet agents. ASA                            Grade Assessment: II - A patient with mild systemic                            disease. After reviewing the risks and benefits,                            the patient was deemed in satisfactory condition to                            undergo the procedure.  After obtaining informed consent, the endoscope was                            passed under direct vision. Throughout the                            procedure, the patient's blood pressure, pulse, and                            oxygen saturations were monitored continuously. The                            GIF [id number] #[id number] was introduced through the                            mouth, and advanced to the second part of  duodenum.                            The upper GI endoscopy was accomplished without                            difficulty. The patient tolerated the procedure                            well. Scope In: Scope Out: Findings:                 One area of narrowing was found at the                            cricopharyngeus. The narrowing was traversed. A                            guidewire was placed and the scope was withdrawn.                            Dilation was performed with a Savary dilator with                            mild resistance at 19 mm. The dilation site was                            examined following endoscope reinsertion and showed                            mild mucosal disruption.                           Biopsies were taken with a cold forceps in the                            proximal esophagus and in the distal esophagus for  histology.                           Localized inflammation characterized by congestion                            (edema), erythema and granularity was found in the                            gastric antrum. Biopsies were taken with a cold                            forceps for histology.                           A single 4 mm sessile polyp with no bleeding was                            found in the second portion of the duodenum. The                            polyp was removed with a cold snare. Resection and                            retrieval were complete. Complications:            No immediate complications. Estimated Blood Loss:     Estimated blood loss was minimal. Impression:               - Esophageal narrowing at cricopharyngeus. Dilated.                           - Gastritis. Biopsied.                           - A single duodenal polyp. Resected and retrieved.                           - Biopsies were taken with a cold forceps for                            histology in the proximal esophagus  and in the                            distal esophagus. Recommendation:           - Await pathology results.                           - Perform a colonoscopy today. Dr Particia Lather "Alan Ripper" Leonides Schanz,  09/12/2023 12:07:01 PM

## 2023-09-12 NOTE — Op Note (Signed)
 [facility] Patient Name: Brittany [name] Procedure Date: 09/12/2023 11:01 AM MRN: [id number] Endoscopist: Particia Lather , , [id number] Age: 68 Referring MD:  Date of Birth: [birth year] Gender: Female Account #: [id number] Procedure:                Colonoscopy Indications:              Screening for colorectal malignant neoplasm,                            Incidental - Diarrhea Medicines:                Monitored Anesthesia Care Procedure:                Pre-Anesthesia Assessment:                           - Prior to the procedure, a History and Physical                            was performed, and patient medications and                            allergies were reviewed. The patient's tolerance of                            previous anesthesia was also reviewed. The risks                            and benefits of the procedure and the sedation                            options and risks were discussed with the patient.                            All questions were answered, and informed consent                            was obtained. Prior Anticoagulants: The patient has                            taken no anticoagulant or antiplatelet agents. ASA                            Grade Assessment: II - A patient with mild systemic                            disease. After reviewing the risks and benefits,                            the patient was deemed in satisfactory condition to                            undergo the procedure.  After obtaining informed consent, the colonoscope                            was passed under direct vision. Throughout the                            procedure, the patient's blood pressure, pulse, and                            oxygen saturations were monitored continuously. The                            Olympus Scope [id number] was introduced through the                            anus and advanced to the the  terminal ileum. The                            colonoscopy was performed without difficulty. The                            patient tolerated the procedure well. The quality                            of the bowel preparation was good. The terminal                            ileum, ileocecal valve, appendiceal orifice, and                            rectum were photographed. Scope In: 11:26:37 AM Scope Out: 11:58:03 AM Scope Withdrawal Time: 0 hours 19 minutes 18 seconds  Total Procedure Duration: 0 hours 31 minutes 26 seconds  Findings:                 The terminal ileum appeared normal.                           Multiple diverticula were found in the sigmoid                            colon, descending colon, transverse colon,                            ascending colon and cecum.                           Three sessile polyps were found in the ascending                            colon and cecum. The polyps were 3 to 6 mm in size.                            These polyps were removed with a cold snare.  Resection and retrieval were complete.                           Four sessile polyps were found in the descending                            colon. The polyps were 3 to 10 mm in size. These                            polyps were removed with a cold snare. Resection                            and retrieval were complete.                           Biopsies for histology were taken with a cold                            forceps from the entire colon for evaluation of                            microscopic colitis.                           Non-bleeding internal hemorrhoids were found during                            retroflexion. Complications:            No immediate complications. Estimated Blood Loss:     Estimated blood loss was minimal. Impression:               - The examined portion of the ileum was normal.                           - Diverticulosis in the  sigmoid colon, in the                            descending colon, in the transverse colon, in the                            ascending colon and in the cecum.                           - Three 3 to 6 mm polyps in the ascending colon and                            in the cecum, removed with a cold snare. Resected                            and retrieved.                           - Four 3 to 10 mm polyps in the descending colon,  removed with a cold snare. Resected and retrieved.                           - Non-bleeding internal hemorrhoids.                           - Biopsies were taken with a cold forceps from the                            entire colon for evaluation of microscopic colitis. Recommendation:           - Discharge patient to home (with escort).                           - Await pathology results.                           - The findings and recommendations were discussed                            with the patient. Dr Particia Lather "Alan Ripper" Leonides Schanz,  09/12/2023 12:11:53 PM

## 2023-09-12 NOTE — Patient Instructions (Signed)
 Resume previous diet and medications. Awaiting pathology results.  Handouts provided on Gastritis, Colon polyps, Diverticulosis and Hemorrhoids  YOU HAD AN ENDOSCOPIC PROCEDURE TODAY AT THE [facility]:   Refer to the procedure report that was given to you for any specific questions about what was found during the examination.  If the procedure report does not answer your questions, please call your gastroenterologist to clarify.  If you requested that your care partner not be given the details of your procedure findings, then the procedure report has been included in a sealed envelope for you to review at your convenience later.  YOU SHOULD EXPECT: Some feelings of bloating in the abdomen. Passage of more gas than usual.  Walking can help get rid of the air that was put into your GI tract during the procedure and reduce the bloating. If you had a lower endoscopy (such as a colonoscopy or flexible sigmoidoscopy) you may notice spotting of blood in your stool or on the toilet paper. If you underwent a bowel prep for your procedure, you may not have a normal bowel movement for a few days.  Please Note:  You might notice some irritation and congestion in your nose or some drainage.  This is from the oxygen used during your procedure.  There is no need for concern and it should clear up in a day or so.  SYMPTOMS TO REPORT IMMEDIATELY:  Following lower endoscopy (colonoscopy or flexible sigmoidoscopy):  Excessive amounts of blood in the stool  Significant tenderness or worsening of abdominal pains  Swelling of the abdomen that is new, acute  Fever of 100F or higher  Following upper endoscopy (EGD)  Vomiting of blood or coffee ground material  New chest pain or pain under the shoulder blades  Painful or persistently difficult swallowing  New shortness of breath  Fever of 100F or higher  Black, tarry-looking stools  For urgent or emergent issues, a gastroenterologist can be reached  at any hour by calling [phone]. Do not use MyChart messaging for urgent concerns.    DIET:  We do recommend a small meal at first, but then you may proceed to your regular diet.  Drink plenty of fluids but you should avoid alcoholic beverages for 24 hours.  ACTIVITY:  You should plan to take it easy for the rest of today and you should NOT DRIVE or use heavy machinery until tomorrow (because of the sedation medicines used during the test).    FOLLOW UP: Our staff will call the number listed on your records the next business day following your procedure.  We will call around 7:15- 8:00 am to check on you and address any questions or concerns that you may have regarding the information given to you following your procedure. If we do not reach you, we will leave a message.     If any biopsies were taken you will be contacted by phone or by letter within the next 1-3 weeks.  Please call us at [phone] if you have not heard about the biopsies in 3 weeks.    SIGNATURES/CONFIDENTIALITY: You and/or your care partner have signed paperwork which will be entered into your electronic medical record.  These signatures attest to the fact that that the information above on your After Visit Summary has been reviewed and is understood.  Full responsibility of the confidentiality of this discharge information lies with you and/or your care-partner.

## 2023-09-13 ENCOUNTER — Telehealth: Payer: Self-pay

## 2023-09-13 ENCOUNTER — Ambulatory Visit: Admitting: Psychiatry

## 2023-09-13 ENCOUNTER — Ambulatory Visit: Payer: HMO | Admitting: Psychiatry

## 2023-09-13 DIAGNOSIS — F3162 Bipolar disorder, current episode mixed, moderate: Secondary | ICD-10-CM

## 2023-09-13 NOTE — Progress Notes (Signed)
 [facility] Counselor/Therapist Progress Note  Patient ID: Brittany [name], MRN: [id number],    Date: 09/13/2023  Time Spent:   Treatment Type: Individual Therapy  Reported Symptoms: anxiety, depression improving, overthinking "overthinking", loneliness "improving some as I'm learning to accept that right now I'm working on me and it's ok if there's not a lot of people around me right now"   Mental Status Exam:  Appearance:   Casual     Behavior:  Appropriate, Sharing, and Motivated  Motor:  Normal  Speech/Language:   Clear and Coherent  Affect:  Depressed and anxious  Mood:  anxious  Thought process:  goal directed  Thought content:    Rumination  Sensory/Perceptual disturbances:    WNL  Orientation:  oriented to person, place, time/date, situation, day of week, month of year, year, and stated date of September 13, 2023  Attention:  Fair  Concentration:  Good and Fair  Memory:  WNL  Fund of knowledge:   Good  Insight:    Good and Fair  Judgment:   Good and Fair  Impulse Control:  Good and Fair   Risk Assessment: Danger to Self:  No Self-injurious Behavior: No Danger to Others: No Duty to Warn:no Physical Aggression / Violence:No  Access to Firearms a concern: No  Gang Involvement:No   Subjective:  Patient in session today and reporting her symptoms to be anxiety, overthinking, depression, difficult relationship within the family, easily hurt by others, and some loneliness although that has improved at times per her report.  Continues to make quick assumptions about people or situations especially regarding how others may be viewing her in negative ways, and will strongly feel that her assumptions are accurate.  Continuing to work on this in sessions here, including today.  Hard to get motivated and energized to stop or limit play in 70 games on her phone when she could be getting other things done or participating in activities with other people.  She does admit at times  that her phone does "take up a lot of her time and it is excessive".  Discussed with patient ways that she might be able to pull back from her phone some and have some time for some other activities that she might find more helpful, as well as being able to have time for other people.  Shares again today that it is really hard for her to break habits even though she knows they keep her from getting household chores done and it keeps her isolated from others.  Encouraged better physical self-care including walking more, having more contact with others as she has the opportunity, looking for more opportunities to engage with other people.  Focused with patient on her "positives" as well as the areas that we are trying to develop more particularly social interaction and feeling that she fits in with other people more.  Continued issues with her adult children and their families, especially in making hurtful assumptions as to why they may not always include her in their activities.  **Working more on Not Assuming the Negative in relationships and situations. Trying to work with patient on some more proactive strategies to be more connected with both family and other people.   Interventions: Cognitive Behavioral Therapy and Ego-Supportive 1) Long Term Goal: Develop and follow through on strategies to reduce symptoms.     Short Term Goal: Reduce anxiety and improve coping skills.     Objective: Learn two new ways of coping with  daily stressors.     Objective: Develop strategies for thought distraction when fixating on the future.     Objective: Manage panic episodes. 2) Long Term Goal: Maintain stability of mood     Short Term Goal: Increase ability to manage moods.     Objective: Discuss and resolve troubling personal and interpersonal issues.   Diagnosis:   ICD-10-CM   1. Bipolar 1 disorder, mixed, moderate (HCC)  F31.62      Plan:   Patient today working further on her self-care, negative thought  patterns, noticing and considering positive opportunities that extended family offered to her, considering other ways of meeting people to feel more connected with others outside the family, and working to decrease her overthinking and making assumptions quickly as this has not worked well for patient in the past.  Encouraged patient more today on not making negative assumptions about others nor herself, trying to have a healthier mindset, better money management, use of more positive/encouraging self talk, remain involved in activities that nurture and help her to maintain stability in mood, refrain from identifying herself as a failure, refrain from getting overly fixated on the future, continue working with strategies regarding thought distractions, stay in the present versus the past, emphasize her positives, practice "the pause" as needed, let her faith be a resource for emotional stability as well as spiritual, understand that her anxious thoughts are "just thoughts" and are not necessarily going to play out in reality the way she might fear or assume, and recognize the strengths she shows working with goal-directed behaviors to move in a direction of overall improved emotional health, more hopefulness, and a more positive outlook for her future ahead. Brittany [name] has made progress and does need to continue her work with goal-directed behaviors to move and a more healthy and hopeful direction.  Goal review and progress/challenges noted with patient.  Next appointment within 2 weeks.   Mathis Fare, LCSW

## 2023-09-13 NOTE — Telephone Encounter (Signed)
  Follow up Call-     09/12/2023   10:39 AM 01/25/2021    8:45 AM  Call back number  Post procedure Call Back phone  # [phone]  Permission to leave phone message Yes Yes     Patient questions:  Do you have a fever, pain , or abdominal swelling? No. Pain Score  0 *  Have you tolerated food without any problems? Yes.    Have you been able to return to your normal activities? Yes.    Do you have any questions about your discharge instructions: Diet   No. Medications  No. Follow up visit  No.  Do you have questions or concerns about your Care? No.  Actions: * If pain score is 4 or above: No action needed, pain <4.

## 2023-09-16 ENCOUNTER — Encounter: Payer: Self-pay | Admitting: Internal Medicine

## 2023-09-16 LAB — SURGICAL PATHOLOGY

## 2023-09-26 ENCOUNTER — Ambulatory Visit: Payer: HMO | Admitting: Psychiatry

## 2023-09-26 ENCOUNTER — Other Ambulatory Visit: Payer: Self-pay | Admitting: Gastroenterology

## 2023-09-26 ENCOUNTER — Other Ambulatory Visit: Payer: Self-pay | Admitting: Pulmonary Disease

## 2023-09-26 ENCOUNTER — Other Ambulatory Visit: Payer: Self-pay | Admitting: Psychiatry

## 2023-09-26 DIAGNOSIS — F4001 Agoraphobia with panic disorder: Secondary | ICD-10-CM

## 2023-09-26 DIAGNOSIS — R197 Diarrhea, unspecified: Secondary | ICD-10-CM

## 2023-09-26 DIAGNOSIS — F5105 Insomnia due to other mental disorder: Secondary | ICD-10-CM

## 2023-09-26 DIAGNOSIS — F3162 Bipolar disorder, current episode mixed, moderate: Secondary | ICD-10-CM | POA: Diagnosis not present

## 2023-09-26 DIAGNOSIS — F4310 Post-traumatic stress disorder, unspecified: Secondary | ICD-10-CM

## 2023-09-26 DIAGNOSIS — R1084 Generalized abdominal pain: Secondary | ICD-10-CM

## 2023-09-26 NOTE — Progress Notes (Signed)
 [facility] Counselor/Therapist Progress Note  Patient ID: Brittany [name], MRN: [id number],    Date: 09/26/2023  Time Spent: 48 minutes   Treatment Type: Individual Therapy  Reported Symptoms:  anxiety, depression "decreased some", "I overthink my overthinking", loneliness not worse as I'm working on my goals    Mental Status Exam:  Appearance:   Casual     Behavior:  Appropriate, Sharing, and Motivated  Motor:  Normal  Speech/Language:   Clear and Coherent  Affect:  Depressed and anxious  Mood:  anxious and depressed  Thought process:  goal directed  Thought content:    Rumination  Sensory/Perceptual disturbances:    WNL  Orientation:  oriented to person, place, time/date, situation, day of week, month of year, year, and stated date of 09/26/2023  Attention:  Fair  Concentration:  Fair  Memory:  WNL  Fund of knowledge:   Good  Insight:    Good and Fair  Judgment:   Fair  Impulse Control:  Fair   Risk Assessment: Danger to Self:  No Self-injurious Behavior: No Danger to Others: No Duty to Warn:no Physical Aggression / Violence:No  Access to Firearms a concern: No  Gang Involvement:No   Subjective:   Patient in session today stating her symptoms more recently have included anxiety, depression, overthinking, easily hurt by others, some loneliness but that has improved at times, and challenging and "touchy" relationship issues within the family.  She adds that the prior loneliness is not quite as bad at times.  Patient still tends to make quick assumptions about people or situations especially regarding whether or not others to view her in a positive or negative way, and will often strongly feel that her assumptions are accurate even if there is not much evidence to report it. States her depression was triggered more recently by contact with her sister, where there is some friction between the two of them. Had a recent negative verbal interaction with younger sister.  Worked further in session today on her anxiety, depression and family related issues that feed her depression. "Lots has happened in extended family which patient elaborated on today. Hard to stay motivated at times but trying to stay focused. Encouraged to not spend quite as much time on her phone "playing 70 games" and have more time to get outside as she is able, and involve herself more with other people. Encouraged better physical self-care and reviewed with patient. Encouraged her having more contact with more positive people, consider following through and and visiting a church that she has mentioned wanting to visit several times previously.  Has shown more positiveness in her talking today which I commented on with patient and encouraged her continuing to try to see more of the positives than negatives as that truly can help her.  Also being aware of more opportunities to engage with healthy people and set limits with those that are unhealthy for her.  Continued encouragement regarding her on physical and emotional self-care and particularly in contact with other people and not isolating.  Commented today again on her "positives" as well as the areas where patient is trying to make changes including social interactions.  To continue working on "not assuming the negatives" in situations.  Reminded of proactive strategies to be more connected in healthier ways with both her family and other people.  Interventions: Cognitive Behavioral Therapy and Ego-Supportive 1) Long Term Goal: Develop and follow through on strategies to reduce symptoms.  Short Term Goal: Reduce anxiety and improve coping skills.     Objective: Learn two new ways of coping with daily stressors.     Objective: Develop strategies for thought distraction when fixating on the future.     Objective: Manage panic episodes. 2) Long Term Goal: Maintain stability of mood     Short Term Goal: Increase ability to manage moods.      Objective: Discuss and resolve troubling personal and interpersonal issues.   Diagnosis:   ICD-10-CM   1. Bipolar 1 disorder, mixed, moderate (HCC)  F31.62      Plan:   Patient continues to work on her goals including negative thought patterns, improved self-care, noticing and considering positive opportunities at extended family offers to her, considering other ways of meeting people to feel more connected with others outside the family, and working to decrease her overthinking and making assumptions too quickly as this has not worked well for patient in the past nor present. Encouraged patient today on her efforts to have a healthier mindset, better money management, use of more positive/encouraging self talk, remaining involved in activities that nurture and help her to maintain stability in mood, refrain from identifying herself as a failure, refrain from getting overly fixated on the future, continue working with strategies regarding thought distractions, stay in the present versus the past, emphasize her positives, practice the pause as needed, let her faith be a resource for emotional stability as well as spiritual, understand that her anxious thoughts are "just thoughts" and are not necessarily going to play out in reality the way she might fear or assume, and realize the strength she shows working with goal-directed behaviors to move in a direction of overall improved emotional health, hopefulness, and a more positive outlook for her future.  Alfredia Desanctis has made progress and she needs to continue her work with goal-directed behaviors to move in a more hopeful and healthier direction.  Goal review and progress/challenges noted with patient.  Next appointment within 2 weeks.   Mathis Fare, LCSW

## 2023-09-27 DIAGNOSIS — M1711 Unilateral primary osteoarthritis, right knee: Secondary | ICD-10-CM | POA: Diagnosis not present

## 2023-10-11 ENCOUNTER — Ambulatory Visit: Payer: HMO | Admitting: Psychiatry

## 2023-10-11 DIAGNOSIS — F3162 Bipolar disorder, current episode mixed, moderate: Secondary | ICD-10-CM

## 2023-10-11 NOTE — Progress Notes (Signed)
 [facility] Counselor/Therapist Progress Note  Patient ID: Brittany [name], MRN: [id number],    Date: 10/11/2023  Time Spent: 55 minutes   Treatment Type: Individual Therapy  Reported Symptoms:   anxiety,depression decreasing, overthinking, some loneliness but not as worse    Mental Status Exam:  Appearance:   Casual     Behavior:  Appropriate, Sharing, and Motivated  Motor:  Normal  Speech/Language:   Clear and Coherent  Affect:  Depressed and anxious  Mood:  anxious and depressed  Thought process:  goal directed  Thought content:    Rumination  Sensory/Perceptual disturbances:    WNL  Orientation:  oriented to person, place, time/date, situation, day of week, month of year, year, and stated date of October 11, 2023  Attention:  Fair  Concentration:  Good and Fair  Memory:  Some short term memory issues  Fund of knowledge:   Good  Insight:    Good and Fair  Judgment:   Good and Fair  Impulse Control:  Good   Risk Assessment: Danger to Self:  No Self-injurious Behavior: No Danger to Others: No Duty to Warn:no Physical Aggression / Violence:No  Access to Firearms a concern: No  Gang Involvement:No   Subjective:   Patient today in session working further on her anxiety, depression, being easily hurt by others, overthinking, some loneliness that is still improving gradually, and trying to better manage challenging and "touchy" relationships within the family.Talked through more of of her anxiety, depression, trying to follow up on some changes she is trying to make including exercise to strengthen her leg muscles as she is expecting to eventually need knee surgery. Loneliness has decreased some and patient does seem to have followed up on suggestions discussed in sessions re: setting healthier boundaries and learning to look at situations and relationships in different ways. Reports loneliness has improved. Some increased interaction with others in her apt complex. Worked  further on relationship issues with sister. Trying to let go of negative assumptions and understand they may not be based in fact/truth. Also trying to not spend as much time playing games on her phone and have more time in engaging with other people including proactive strategies in connection with family. Continues to work on some increased efforts in positive thinking rather than assuming the negatives.  Interventions: Cognitive Behavioral Therapy, Solution-Oriented/Positive Psychology, and Ego-Supportive  1) Long Term Goal: Develop and follow through on strategies to reduce symptoms.     Short Term Goal: Reduce anxiety and improve coping skills.     Objective: Learn two new ways of coping with daily stressors.     Objective: Develop strategies for thought distraction when fixating on the future.     Objective: Manage panic episodes. 2) Long Term Goal: Maintain stability of mood     Short Term Goal: Increase ability to manage moods.     Objective: Discuss and resolve troubling personal and interpersonal issues.    Diagnosis:   ICD-10-CM   1. Bipolar 1 disorder, mixed, moderate (HCC)  F31.62      Plan:  Patient today continuing to work with goal-directed behaviors and trying to work on healing some of past and have a more positive/hopeful outlook now. Working to decrease negative thought patterns, noticing more positives and not emphasizing the negatives as much, improved self-care, connecting with people who are healthy for her, trying to decrease overthinking and not making quick assumptions as to how things are or have to be.  Reminded  and encouraged patient today to keep working towards having a healthier mindset (emotionally and physically), better money management, use of more positive/encouraging self talk, remain involved in activities that nurture and help her to maintain stability in mood, refrain from identifying herself as a failure, refrain from getting overly fixated on the future,  continue working with strategies regarding thought distractions, stay in the present versus the past, emphasize her positives, practice the pause as needed, let her faith be a resource for emotional stability as well as spiritual, understand that her anxious thoughts are "just thoughts" and are not necessarily going to play out in reality the way she might fear or assume, and recognize the strength she shows working with goal-directed behaviors to move in a direction of improved emotional health, increased hopefulness, and an overall more positive outlook into her future.  Goal review and progress/challenges noted with patient.  Next appointment within 2 weeks.   Kelleen Patee, LCSW

## 2023-10-15 ENCOUNTER — Encounter: Payer: Self-pay | Admitting: Psychiatry

## 2023-10-15 ENCOUNTER — Ambulatory Visit (INDEPENDENT_AMBULATORY_CARE_PROVIDER_SITE_OTHER): Payer: HMO | Admitting: Psychiatry

## 2023-10-15 DIAGNOSIS — F515 Nightmare disorder: Secondary | ICD-10-CM | POA: Diagnosis not present

## 2023-10-15 DIAGNOSIS — F5105 Insomnia due to other mental disorder: Secondary | ICD-10-CM

## 2023-10-15 DIAGNOSIS — F4001 Agoraphobia with panic disorder: Secondary | ICD-10-CM

## 2023-10-15 DIAGNOSIS — F4310 Post-traumatic stress disorder, unspecified: Secondary | ICD-10-CM | POA: Diagnosis not present

## 2023-10-15 DIAGNOSIS — F4312 Post-traumatic stress disorder, chronic: Secondary | ICD-10-CM | POA: Diagnosis not present

## 2023-10-15 MED ORDER — BUSPIRONE HCL 15 MG PO TABS: 15.0000 mg | ORAL_TABLET | Freq: Three times a day (TID) | ORAL | 1 refills | Status: DC

## 2023-10-15 MED ORDER — BUPROPION HCL ER (XL) 150 MG PO TB24: ORAL_TABLET | ORAL | 0 refills | Status: DC

## 2023-10-15 MED ORDER — DOXAZOSIN MESYLATE 4 MG PO TABS: 8.0000 mg | ORAL_TABLET | Freq: Every day | ORAL | 0 refills | Status: DC

## 2023-10-15 MED ORDER — CLONAZEPAM 0.5 MG PO TABS: ORAL_TABLET | ORAL | 0 refills | Status: DC

## 2023-10-15 NOTE — Progress Notes (Signed)
 Brittany [name] [id number] [birth year] 68 y.o.     Subjective:   Patient ID:  Brittany [name] is a 68 y.o. (DOB 02-Aug-1955) female.  Chief Complaint:  Chief Complaint  Patient presents with   Follow-up   Fatigue   ADD   Other    Some anhedonia    Brittany [name] presents to the office today for follow-up of long-term depression and anxiety and insomnia.    At visit September 08, 2018 at which time the patient was weaned off of gabapentin  and started on Trileptal  up to twice daily.  She has been very med sensitive and difficult to treat because of that.  When seen September 19, 2018.  No meds were changed.  She stopped oxcarbazapine on her own DT itching.  seen July 2020.  She did not want to try any additional medications despite ongoing depression.  She wanted to stop lithium  despite risk of worsening depression.  She was given instructions about how to do it in the safest manner possible.  Last seen August 12, 2019..   Still taking diazepam  5 TID, mirtazapine  15, doxazosin  increased to 8 mg nightly for nightmares from 6 mg.    10/16/2019 appointment, the following noted: NM much better with increase prazosin to 8 mg HS and anxiety better that was related and tolerated.  Overall anxiety still a problem.  depression and anxiety much worse for a week after 2nd Covid vaccine recently.   Also overall more anxious and agoraphobia is bad.  Does force herself to go to [employer] weekly.   Averaging diazepam  5 mg BID-TID daily.  No drowsiness.  Avoids more at night bc of worsening NM from it. Weird dreams and occ awakens with panic.  Lost another "fur baby" that's 2 in 1 year early 2021.  $ and health stress.  Dreams are worse than last visit.  No SE.  Mirtazapine  helps sleep generally and much better with it than without it.  Post Covid November 2020. Got significantly better in depression since here.  Prayed a lot about it and thought the lithium  was helping.  Feels better now off it.  Was going  through a lot at the time.  But after a month more peace and joy.   Buspirone  helped.  Diazepam  better than Xanax  which she feels triggered dissociation and irritabilty.  Lost her dog which is hard.  Prayed and that helped.  Oldest GS joined special forces and gave her his car.  Anxiety driving but does it.   Plan: no med changes  02/09/20 appt with the following noted: Went back down on doxazosin  to 4 mg bc of dizziness but it's not better.  She plans to talk to her other doctors about it.. CO anxiety and wanting to take more meds for it.  Anxiety without reason generally. Increased Diazepam  from 5 mg TID to QID end May 2021.   Worsening mirtazapine  also.  Has done some night eating without memory the next day. Doesn't seem to be anxiety causing insomnia.  Plan: Start topiramate  off label for anxiety and weight loss at 25 mg daily and increase gradually to 100 mg daily.  06/21/2020 appointment with the following noted: Had problems with night terrors again with stress around $ and pet sick.  Getting better again.   Still taking topiramate  100 mg started last visit. Lost weight DT stomach problems.  Doctors cannot explain.  Feels like poor absorption of nutrients.  Difficulty tolerating foods.  Dx bacterial overgrowth in  small intestine but corrected. Stomach constantly burns.  Sx were present before topiramate ? Anxiety is OK if stays home but is worse in public.  Meds are helping anxiety.  Not markedly depressed.  Meds are helping.  11/30/20 appt noted: Chronic anxiety averaging ? Amount daily of diazepam .  Anxiety is higher out of the house.  Gets out of the house regularly to help care for 7 mos old GS.  D took her to beach last week.   Has 5 gallon fish tank with some guppies.   Occ night terrrors. Occ EMA Taking doxazosin  6 mg HS, 4 mg was less effective. Generally more trouble with anxiety than depres but some depression occ with $ stress and despise where she lives.   Ketogenic diet  helped GI usually but some pain today.  Haunted with medical bills. Had Covid hosp 11 days and then home with O2 for a month.  Pulm said don't get Covid again.  Keeps her from going to church regularly but that would help. Plan: Also topiramate  Continue buspirone  15 mg 3 times daily,  and mirtazapine  30 mg nightly for sleep.  Has been compliant with Valium .  She asks about an increase to increase.  OK continue Valium  5 mg  QID prn but take LED. Cont buspirone  15 mg TID, doxazosin  6-8 mg HS  03/29/2021 appointment with the following noted: Has aquarium. Doing OK except some seasonal depression and irritability noticed. Increased doxazosin  between 6-8 mg HS.  Still having weird dreams but not having NM or night terrrors usually. No med changes  09/27/2021 appointment with the following noted: A lot better than normal Oct=Feb.  New GS, D married and some things to look forward to but still some anniversaries of losses.  Helping D with 48 mo old GS helps her a lot.  But did gain weight over the winter.  Dx CAD and put on Statin with cog and depressive and anxiety SE.  Current one not as bad but a little more anxiety and using a little more diazepam .  Nervous about people working in her appt. Going to [facility] on plane for first time with her family.  Excited but nervous too.   Plan no med changes  01/25/2022 appointment noted: No concerns about psych meds except Chronic $ stress.  Has to make choices about essentials.  I put myself into dept living on credit cards. Will go to sleep but EMA 40% of time despite mirtazapine .  Not usually a napper but on occ.    Can't tolerate statins. Even with CoQ10. Chronic med sensitivity.  Brought Genesight test. Got SI about 2-4 weeks into statin and better off it after 3-4 weeks off it. Minimal depression at this time.  Some chronic anxiety which is worse than depression. Disc problematic night eating.  07/02/2022 phone call complaining of high depression  and anxiety.  Appointment was moved up.  07/03/22 appointment noted: Current psych meds diazepam  5 mg every 6 hours as needed, mirtazapine  45 mg nightly, buspirone  15 mg 3 times daily, doxazosin  4 to 8 mg nightly prn. Thinks mood got worse when on statins.  Stopped it in summer.  Then took red yeast rice and had a little with it too.   Gained wt in summer from foot surgery.  $ stress from living on credit cards. Complaining of much worse depression and anxiety.  Increased SI persistent like 10-15 years ago. Don't like crowds.  Frustrated and irritable. Thoutghts jumbled. Was at diazepam  5 mg 3  daily and now 4 daily.  At times has taken 10 mg at a time. Plans to restart therapy.  Wants Christian therapist. Chronically fearful of psych meds and doesn't want to start something extra. Still a lot of GI problems. Plan: DC mirtazapine . Caplyta 42 mg HS trial  07/31/22 TC stopped Caplyta and doesn't want to try another med.  Complained of dizziness and being cold with it.  Didn't make her sleepy.    09/04/22 appt noted: Resumed mirtazapine  45 HS, diazepam  5 , buspirone  15 TID, doxazosine 4-8 mg HS. NM occ but less severe.   Still has some EMA.  She doesn't want to change doses. Seeing therapist has helped her cope and mood and anxiety is better.  She doesn't feel she needs more meds or other meds.  No severe mood swings.  Not severely depressed.  Anxiety manageable usually.   Was more irritable when visited D and 2yo grandson.  Did miss meds more while there. No SE with current meds. Insurance will cover 100 days of meds. I think I have a control problem.   Plan no changes  01/01/23 appt noted: Meds as above:  buspirone  15 TID, diazepam  5-10 mg TID prn, doxazosin  4-8 mg HS, mirtazapine  45 HS. Anxiety worse in the am.  Has increased diazepam  use and feels it is less effective.  More $ strain DT inflation & credit card debt and trying to get out of the problem. Lost 30#.  Disc wt loss and temp  sensitivity.  Stopped bread and sugar and dairy.   Tries to walk her dog daily.   Working on heart health bc genetics.   Night terrors stopped and reduced doxazosin  but didn't slep as well.  So went back up and didn't solve the problems.   More EMA than before.  Sometimes naps an hour.   Mood "rollercoaster".  This weekend dep with SI without reason. New cholesterol med; not a statin.  HX statins with strong SI. Mind constantly racing on random things with some worry related to health and $. At times dep worse than anxiety.   Thinks mirtazapine  may help her fall asleep faster.  Doesn't sleep any better with less than 45 mg mirtazapine .   Plan: Continue buspirone  15 mg 3 times daily,  mirtazapine  per her request at 22.5-45 mg HS.  Brittany [name] Switch clonazepam  0.5 mg BID & 1mg  hS Continue Vitamin D  Doxazosin  8 mg HS  03/05/23 appt noted: Re: switch from diazepam  to clonazepam  "I wish you had switched me years ago". Slowed down racing thoughts.  Sleep is a little better.  Not as anxious when awaken at night.  Usually anxiety is worse in the am and not as bad.  Response to clonazepam  is smoother.  Not as sensitive to a missed dose.  Family under a lot of stress right now.  D dx with aggressive breast Ca.  D married with a 74 yo son.  D more anxious and dep and pending surgery and chemo.  Pending double mastectomy and radiation.  She is first person pt knows personally with this.   Plan no changes  06/14/23 appt noted: Still in therapy with Brittany [name]. Psych meds: as above D battling breast CA. Pretty good considering re: mental health.  Increased vitamin D  for the winter. Low Motivation and impulsive eating.  Fought so hard to lose wt and realized genetics for high cholesterol.  Working to change mind set.   Better focus and less mind racing with clonazepam .  Occ night without sleep.  Last night EMA 2 AM and back to sleep at 6.  Now about once every couple weeks with clonazepam .   Doxazosin   stopped NM and night terrors.  Only a couple in last 3 mos. Brittany [name] is oldest D's 62 th birthday; she is the one who only lived 2 days.  Yesterday was a bad day.   Hasn't read much in years.  But plans to get back into it.   Wonders about hypnosis for eating problems and impulse control with spending.   Has made progress on latter. No mania.  Plan: Continue buspirone  15 mg 3 times daily,  mirtazapine  per her request at 22.5-45 mg HS.  Brittany [name] Better with Switch clonazepam  0.5 mg BID & 1mg  hS vs diazepam . Continue Vitamin D  Continue doxazosin  8 mg HS for NM  10/15/23 appt note: Med as above. Not sad nor dep but not much motivated to do chores.  Off and on for years but steady for mos.  Seems to get worse.   Out of work since 2010.   Anxiety managed pretty well. Not sleepy with clonazepam .   Sleep pretty good.  NM none for awhile until recent when woke in panic.  Sometimes triggered by TV program or her sister.  A whole lot better. Rare daytime panic triggered.   Worries about weight gain with meds generally fearful of meds.  Chronic $ problems.  Stopped therapy about September 2020.  Multiple med failures.  Med sensitive & cost limit options. restoril, diazepam  is ok. NO MORE Xanax  DT dissociation and irritability,   Trazodone NR, Ambien, hyrdroxyzine, prazosin, doxazosin ,  propranolol,  mirtazapine  45 sertraline, fluoxetine NR, Lexapro,   Buspar  15 TID, gabapentin , Wellbutrin 25 y ago helped stop smoking. Latuda, Olanzapine, risperidone with AE, Seroquel SE, Geodon, Saphris,  Abilify, Rexulti seemed to help for short while but $, Caplyta 42 dizzy  Trileptal  itching, lithium  NR, CBZ SE, Lamotrigine, VPA,  History statins strong SI  Review of Systems:  Review of Systems  Constitutional:  Positive for fatigue.  HENT:  Negative for congestion.   Respiratory:  Positive for shortness of breath and wheezing.   Gastrointestinal:  Negative for abdominal distention.  Musculoskeletal:   Positive for back pain.  Neurological:  Negative for dizziness, tremors and light-headedness.  Psychiatric/Behavioral:  Positive for depression and sleep disturbance. Negative for agitation, behavioral problems, confusion, decreased concentration, dysphoric mood, hallucinations, self-injury and suicidal ideas. The patient is nervous/anxious. The patient is not hyperactive.        Please refer to HPI  Stress eating.  Medications: I have reviewed the patient's current medications.  Current Outpatient Medications  Medication Sig Dispense Refill   albuterol  (PROVENTIL ) (2.5 MG/3ML) 0.083% nebulizer solution Take 3 mLs (2.5 mg total) by nebulization every 6 (six) hours as needed for wheezing or shortness of breath. 75 mL 12   Albuterol  Sulfate (PROAIR  RESPICLICK) 108 (90 Base) MCG/ACT AEPB Inhale 2 puffs into the lungs every 6 (six) hours as needed. 1 each 5   Ascorbic Acid  (VITA-C PO) Take 1 tablet by mouth daily.     Azelastine  HCl 137 MCG/SPRAY SOLN SPRAY 1 SPRAY IN EACH NOSTRIL TWICE DAILY 30 mL 5   busPIRone  (BUSPAR ) 15 MG tablet Take 1 tablet (15 mg total) by mouth 3 (three) times daily. 270 tablet 1   cetirizine  (ZYRTEC ) 10 MG tablet Take 1 tablet (10 mg total) by mouth daily. 90 tablet 1   Cholecalciferol (VITAMIN D -3 PO) Take 10,000 Units by  mouth daily.      clonazePAM  (KLONOPIN ) 0.5 MG tablet TAKE 1 TABLET BY MOUTH 2 TIMES A DAY AND TAKE TWO TABLETS BY MOUTH AT BEDTIME 360 tablet 0   dicyclomine  (BENTYL ) 10 MG capsule TAKE 1 CAPSULE BY MOUTH 3 TIMES A DAY AS NEEDED FOR SPASMS 90 capsule 2   dicyclomine  (BENTYL ) 20 MG tablet TAKE ONE TABLET BY MOUTH FOUR TIMES A DAY AS NEEDED FOR SPASMS 360 tablet 3   doxazosin  (CARDURA ) 4 MG tablet Take 2 tablets (8 mg total) by mouth at bedtime. 180 tablet 0   Evolocumab  (REPATHA  SURECLICK) 140 MG/ML SOAJ Inject 140 mg into the skin every 14 (fourteen) days. 6 mL 3   famotidine  (PEPCID ) 20 MG tablet Take 1 tablet (20 mg total) by mouth at bedtime. 90  tablet 3   fluticasone  (FLONASE ) 50 MCG/ACT nasal spray USE 2 SPRAYS INTO BOTH NOSTRILS TWICE DAILY 96 mL 2   fluticasone -salmeterol (ADVAIR) 250-50 MCG/ACT AEPB Inhale 1 puff into the lungs every 12 (twelve) hours. 180 each 1   Menaquinone-7 (VITAMIN K2) 100 MCG CAPS Take 1 capsule by mouth daily.     mirtazapine  (REMERON ) 45 MG tablet Take 1 tablet (45 mg total) by mouth at bedtime. 100 tablet 1   montelukast  (SINGULAIR ) 10 MG tablet Take 1 tablet (10 mg total) by mouth at bedtime. 100 tablet 2   Multiple Vitamin (MULTIVITAMIN WITH MINERALS) TABS tablet Take 1 tablet by mouth daily.     Omega-3 Fatty Acids (OMEGA 3 PO) Take 1 capsule by mouth daily.     pantoprazole  (PROTONIX ) 40 MG tablet Take 1 tablet (40 mg total) by mouth daily. 90 tablet 3   sodium chloride  (OCEAN) 0.65 % SOLN nasal spray Place 1 spray into both nostrils as needed for congestion.     buPROPion (WELLBUTRIN XL) 150 MG 24 hr tablet Take 1 tablet (150 mg total) by mouth daily for 7 days, THEN 2 tablets (300 mg total) daily. (Patient not taking: Reported on 10/15/2023) 30 tablet 0   ezetimibe  (ZETIA ) 10 MG tablet Take 1 tablet (10 mg total) by mouth daily. 90 tablet 3   No current facility-administered medications for this visit.    Medication Side Effects: None  Allergies:  Allergies  Allergen Reactions   Aspirin Other (See Comments)    discomfort   Atorvastatin  Other (See Comments)    Patient states she felt suicidal   Codeine Itching   Crestor  [Rosuvastatin ] Other (See Comments)    Dizzy, leg pain and states she felt suicidal   Gabapentin  Other (See Comments)   Niacin And Related Itching   Sulfa Antibiotics Other (See Comments)    Other reaction(s): Unknown   Sulfamethoxazole-Trimethoprim Other (See Comments)    Unknown reaction per pt   Topiramate  Other (See Comments)    Per patient, it causes stomach burning   Trileptal  [Oxcarbazepine ] Itching    itching   Trimethoprim Other (See Comments)    Unknown, per  pt   Ultram [Tramadol] Itching   Vioxx [Rofecoxib] Other (See Comments)    Unknown per pt    Past Medical History:  Diagnosis Date   Abnormal EKG    Allergy    Anxiety    Arthritis    Asthma    Bipolar depression (HCC), Anxiety, PTSD, Panic disorder    -managed by [facility]   CAD (coronary artery disease)    Depression    Foot pain    Gallstones    GERD (gastroesophageal reflux disease)  Heart murmur    as a child   HLD (hyperlipidemia)    Insomnia    Leg swelling    Obesity    Osteoporosis    osteopenia   Pneumonia due to COVID-19 virus 04/22/2019   Tachycardia     Family History  Problem Relation Age of Onset   Hypertension Mother    Hyperlipidemia Mother    Congestive Heart Failure Mother    Other Father        killed   COPD Brother    Heart disease Brother    Pulmonary embolism Brother    Colon polyps Maternal Aunt    COPD Paternal Aunt        a lot of aunts and uncle COPD or Emphysema   Emphysema Paternal Uncle    Breast cancer Daughter    Colon cancer Neg Hx    Esophageal cancer Neg Hx    Pancreatic cancer Neg Hx    Stomach cancer Neg Hx    Liver disease Neg Hx    Rectal cancer Neg Hx     Social History   Socioeconomic History   Marital status: Single    Spouse name: Not on file   Number of children: 3   Years of education: Not on file   Highest education level: Associate degree: occupational, Scientist, product/process development, or vocational program  Occupational History   Occupation: disabled  Tobacco Use   Smoking status: Former    Current packs/day: 0.00    Average packs/day: 20.0 packs/day for 1 year (20.0 ttl pk-yrs)    Types: Cigarettes    Start date: 06/18/1998    Quit date: 06/19/1999    Years since quitting: 24.3   Smokeless tobacco: Never   Tobacco comments:    20 year estimate but probably less   Vaping Use   Vaping status: Never Used  Substance and Sexual Activity   Alcohol use: Yes    Alcohol/week: 0.0 standard drinks of alcohol     Comment: occ   Drug use: No   Sexual activity: Never  Other Topics Concern   Not on file  Social History Narrative   Work or School: Disabled seconndary to psychiatric conditions      Home Situation: lives alone with 2 cats and one dog      Spiritual Beliefs: Christian      Lifestyle: no regular exercise, diet  Not great - wants to embark on healthier lifestyle   Social Drivers of Health   Financial Resource Strain: High Risk (04/24/2023)   Overall Financial Resource Strain (CARDIA)    Difficulty of Paying Living Expenses: Hard  Food Insecurity: Food Insecurity Present (04/24/2023)   Hunger Vital Sign    Worried About Running Out of Food in the Last Year: Often true    Ran Out of Food in the Last Year: Often true  Transportation Needs: Unmet Transportation Needs (04/24/2023)   PRAPARE - Administrator, Civil Service (Medical): Yes    Lack of Transportation (Non-Medical): Yes  Physical Activity: Sufficiently Active (04/24/2023)   Exercise Vital Sign    Days of Exercise per Week: 5 days    Minutes of Exercise per Session: 30 min  Stress: Stress Concern Present (04/24/2023)   [employer] of Occupational Health - Occupational Stress Questionnaire    Feeling of Stress : To some extent  Social Connections: Moderately Isolated (04/24/2023)   Social Connection and Isolation Panel [NHANES]    Frequency of Communication with Friends and Family: Once  a week    Frequency of Social Gatherings with Friends and Family: Once a week    Attends Religious Services: More than 4 times per year    Active Member of [employer] or Organizations: No    Attends Engineer, structural: More than 4 times per year    Marital Status: Divorced  Catering manager Violence: Not At Risk (05/24/2022)   Humiliation, Afraid, Rape, and Kick questionnaire    Fear of Current or Ex-Partner: No    Emotionally Abused: No    Physically Abused: No    Sexually Abused: No    Past Medical History,  Surgical history, Social history, and Family history were reviewed and updated as appropriate.   Divorced twice.  Please see review of systems for further details on the patient's review from today.   Objective:   Physical Exam:  There were no vitals taken for this visit.  Physical Exam Constitutional:      General: She is not in acute distress. Musculoskeletal:        General: No deformity.  Neurological:     Mental Status: She is alert and oriented to person, place, and time.     Cranial Nerves: No dysarthria.     Coordination: Coordination normal.  Psychiatric:        Attention and Perception: Attention and perception normal. She does not perceive auditory or visual hallucinations.        Mood and Affect: Mood is anxious. Mood is not depressed. Affect is not labile, blunt, angry or inappropriate.        Speech: Speech normal. Speech is not rapid and pressured or slurred.        Behavior: Behavior normal. Behavior is cooperative.        Thought Content: Thought content normal. Thought content is not paranoid or delusional. Thought content does not include homicidal or suicidal ideation. Thought content does not include suicidal plan.        Cognition and Memory: Cognition and memory normal.        Judgment: Judgment normal.     Comments: Insight and judgment fair.  not overtly manic.  chronic $ stress Anxiety and depression are better not gone.  CC low motivation. Talkative Racing thoughts better.     Lab Review:     Component Value Date/Time   NA 139 04/26/2023 1125   NA 141 11/17/2021 0835   K 4.1 04/26/2023 1125   CL 105 04/26/2023 1125   CO2 28 04/26/2023 1125   GLUCOSE 92 04/26/2023 1125   BUN 19 04/26/2023 1125   BUN 16 11/17/2021 0835   CREATININE 0.73 04/26/2023 1125   CREATININE 0.94 04/06/2020 0916   CALCIUM  9.2 04/26/2023 1125   PROT 6.3 05/22/2023 1044   ALBUMIN 4.4 05/22/2023 1044   AST 13 05/22/2023 1044   ALT 16 05/22/2023 1044   ALKPHOS 61  05/22/2023 1044   BILITOT 0.3 05/22/2023 1044   GFRNONAA >60 05/01/2019 0155   GFRAA >60 05/01/2019 0155       Component Value Date/Time   WBC 6.8 04/26/2023 1125   RBC 4.89 04/26/2023 1125   HGB 15.5 (H) 04/26/2023 1125   HCT 45.8 04/26/2023 1125   PLT 338.0 04/26/2023 1125   MCV 93.8 04/26/2023 1125   MCH 31.7 04/06/2020 0916   MCHC 33.8 04/26/2023 1125   RDW 13.6 04/26/2023 1125   LYMPHSABS 0.8 05/10/2021 0934   MONOABS 0.3 05/10/2021 0934   EOSABS 0.1 05/10/2021 0934   BASOSABS  0.0 05/10/2021 0934    No results found for: "POCLITH", "LITHIUM "   Lab Results  Component Value Date   VALPROATE 25.3 (L) 10/26/2008     .res Assessment: Plan:    Brittany [name] was seen today for follow-up, fatigue, add and other.  Diagnoses and all orders for this visit:  PTSD (post-traumatic stress disorder) -     buPROPion (WELLBUTRIN XL) 150 MG 24 hr tablet; Take 1 tablet (150 mg total) by mouth daily for 7 days, THEN 2 tablets (300 mg total) daily. (Patient not taking: Reported on 10/15/2023) -     clonazePAM  (KLONOPIN ) 0.5 MG tablet; TAKE 1 TABLET BY MOUTH 2 TIMES A DAY AND TAKE TWO TABLETS BY MOUTH AT BEDTIME -     busPIRone  (BUSPAR ) 15 MG tablet; Take 1 tablet (15 mg total) by mouth 3 (three) times daily.  Panic disorder with agoraphobia -     clonazePAM  (KLONOPIN ) 0.5 MG tablet; TAKE 1 TABLET BY MOUTH 2 TIMES A DAY AND TAKE TWO TABLETS BY MOUTH AT BEDTIME  Nightmares associated with chronic post-traumatic stress disorder -     doxazosin  (CARDURA ) 4 MG tablet; Take 2 tablets (8 mg total) by mouth at bedtime.  Insomnia due to mental condition -     clonazePAM  (KLONOPIN ) 0.5 MG tablet; TAKE 1 TABLET BY MOUTH 2 TIMES A DAY AND TAKE TWO TABLETS BY MOUTH AT BEDTIME    Chronic noncompliance complicates treatment but bettter lately Likey borderline personality disorder vs PTSD complication.  30 min face to face time with patient was spent on counseling and coordination of care. We  discussed her chronic sx of depression, anxiety and insomnia combined with medication sensitivity which makes it very difficult to treat her.  Multiple med failures.  Limited insight into bipolar vs PD and has refused mood stabilizers.  This was discussed with her repeatedly. But she has not had manic episodes off mood stabilizers. Seems unusual that she's not having manic episodes apparently without mood stabilizer.  ? Racing thought related but not real prominent euphoria or irritability.  Cycles of dep.    May change dx back to major depression. Her faith helps. Still not reporting manic phases.    At the visit July 03, 2022 she is complaining of more symptoms of depression and anxiety and irritability.  She is having additional financial stress but financial stress has been chronic.  She feels like her mood worsened while on statins and has never recovered since the summer 2023 but it is progressively worse. She has failed to respond to multiple SSRIs and other psychiatric medications.    Option clonidine We discussed the short-term risks associated with benzodiazepines including sedation and increased fall risk among others.  She reports better anxiety control with diazepam  as opposed to Xanax  intially until now.  She does not want to be prescribed Xanax  at any point in the future bc history of dissociation with it.  Discussed long-term side effect risk including dependence, potential withdrawal symptoms, and the potential eventual dose-related risk of dementia.  Evidently developing tolerance.  No higher BZ dose bc of this.  Disc risk.   Advantage of clonazepam  which probably does have some mild mood stabilizing effects.   For energy motivation trial Wellbutrin.  Disc risk mania or anxiety.  Anxiety managed.   Reviewed [facility] results in detail previously.  Option Viibryd for anxiety.  T4 tier and she doesn't think she can afford.  Only options are generic.  Answered questions about  trying hypnosis  for overeating.  Continue buspirone  15 mg 3 times daily,   mirtazapine  per her request at 22.5-45 mg HS.  Brittany [name]  Better with Switch clonazepam  0.5 mg BID & 1mg  hS vs diazepam .  Continue Vitamin D   Continue doxazosin  8 mg HS for NM  No med changes  FU 3-4 mos  Nori Beat, MD, DFAPA   Please see After Visit Summary for patient specific instructions.  Future Appointments  Date Time Provider Department Center  10/24/2023  8:30 AM Aida House, MD [facility]  10/25/2023 10:00 AM Kelleen Patee, [name] None  11/07/2023 10:00 AM Kelleen Patee, [name] None  11/22/2023 10:00 AM Kelleen Patee, [name] None  12/04/2023  8:50 AM Daina Drum, MD [facility]  12/06/2023 10:00 AM Kelleen Patee, [name] None  12/13/2023  9:30 AM [facility] DX DEXA 1 [facility]  12/13/2023 10:10 AM [facility] MM 2 [facility]  12/19/2023 10:00 AM Kelleen Patee, [name] None    No orders of the defined types were placed in this encounter.     -------------------------------

## 2023-10-23 ENCOUNTER — Telehealth: Payer: Self-pay

## 2023-10-23 NOTE — Telephone Encounter (Signed)
   Pre-operative Risk Assessment    Patient Name: Brittany [name]  DOB: [birth year] MRN: [id number]   Date of last office visit: 11/22/22 Janelle Mediate, MD Date of next office visit: NONE   Request for Surgical Clearance    Procedure:   TOTAL KNEE ARTHROPLASTY  Date of Surgery:  Clearance TBD                                Surgeon:  DR Orvan Blanch Surgeon's Group or Practice Name:  [facility] Phone number:  [phone] Fax number:  [phone]   ATTN: Valinda Gault WILLS   Type of Clearance Requested:   - Medical    Type of Anesthesia:  General    Additional requests/questions:    SignedCollin Deal   10/23/2023, 6:15 PM

## 2023-10-24 ENCOUNTER — Ambulatory Visit: Payer: Self-pay

## 2023-10-24 ENCOUNTER — Telehealth: Payer: Self-pay

## 2023-10-24 ENCOUNTER — Encounter: Payer: HMO | Admitting: Family Medicine

## 2023-10-24 NOTE — Telephone Encounter (Signed)
   Name: Brittany [name]  DOB: 1956/05/24  MRN: [id number]  Primary Cardiologist: Janelle Mediate, MD   Preoperative team, please contact this patient and set up a phone call appointment for further preoperative risk assessment. Please obtain consent and complete medication review. Thank you for your help.  I confirm that guidance regarding antiplatelet and oral anticoagulation therapy has been completed and, if necessary, noted below.  None requested.   I also confirmed the patient resides in the state of [place] . As per [facility] Medical Board telemedicine laws, the patient must reside in the state in which the provider is licensed.   Morey Ar, NP 10/24/2023, 5:01 PM [facility]

## 2023-10-24 NOTE — Telephone Encounter (Signed)
 Med rec and Consent done     Patient Consent for Virtual Visit        Brittany [name] has provided verbal consent on 10/24/2023 for a virtual visit (video or telephone).   CONSENT FOR VIRTUAL VISIT FOR:  Brittany [name]  By participating in this virtual visit I agree to the following:  I hereby voluntarily request, consent and authorize [facility] and its employed or contracted physicians, physician assistants, nurse practitioners or other licensed health care professionals (the Practitioner), to provide me with telemedicine health care services (the "Services") as deemed necessary by the treating Practitioner. I acknowledge and consent to receive the Services by the Practitioner via telemedicine. I understand that the telemedicine visit will involve communicating with the Practitioner through live audiovisual communication technology and the disclosure of certain medical information by electronic transmission. I acknowledge that I have been given the opportunity to request an in-person assessment or other available alternative prior to the telemedicine visit and am voluntarily participating in the telemedicine visit.  I understand that I have the right to withhold or withdraw my consent to the use of telemedicine in the course of my care at any time, without affecting my right to future care or treatment, and that the Practitioner or I may terminate the telemedicine visit at any time. I understand that I have the right to inspect all information obtained and/or recorded in the course of the telemedicine visit and may receive copies of available information for a reasonable fee.  I understand that some of the potential risks of receiving the Services via telemedicine include:  Delay or interruption in medical evaluation due to technological equipment failure or disruption; Information transmitted may not be sufficient (e.g. poor resolution of images) to allow for appropriate medical  decision making by the Practitioner; and/or  In rare instances, security protocols could fail, causing a breach of personal health information.  Furthermore, I acknowledge that it is my responsibility to provide information about my medical history, conditions and care that is complete and accurate to the best of my ability. I acknowledge that Practitioner's advice, recommendations, and/or decision may be based on factors not within their control, such as incomplete or inaccurate data provided by me or distortions of diagnostic images or specimens that may result from electronic transmissions. I understand that the practice of medicine is not an exact science and that Practitioner makes no warranties or guarantees regarding treatment outcomes. I acknowledge that a copy of this consent can be made available to me via my patient portal [facility] MyChart), or I can request a printed copy by calling the office of [facility].    I understand that my insurance will be billed for this visit.   I have read or had this consent read to me. I understand the contents of this consent, which adequately explains the benefits and risks of the Services being provided via telemedicine.  I have been provided ample opportunity to ask questions regarding this consent and the Services and have had my questions answered to my satisfaction. I give my informed consent for the services to be provided through the use of telemedicine in my medical care

## 2023-10-24 NOTE — Telephone Encounter (Signed)
 Ok to close

## 2023-10-24 NOTE — Telephone Encounter (Signed)
 Pt scheduled for TELE Preop appt 10/30/23. Med rec and Consent done

## 2023-10-24 NOTE — Telephone Encounter (Signed)
 Chief Complaint: diarrhea Symptoms: diarrhea, abdominal pain, nausea, belching Frequency: constant since 0200 this monring Pertinent Negatives: Patient denies vomiting, too weak to stand, dizziness, fever, blood in stool, dry mouth Disposition: [] ED /[] Urgent Care (no appt availability in office) / [x] Appointment(In office/virtual)/ []  [facility]/ [] Home Care/ [x] Refused Recommended Disposition /[] [facility]/ []  Follow-up with PCP Additional Notes: Patient states she has a history of GI problems, denies eating anything that may have caused a flare up. Patient cancelled her appointment for today and refused offer to schedule acute visit today. Patient states she does not feel well enough to drive. Advised to go to urgent care or ED if symptoms persist or worsen. Patient states she would like her home phone [phone] changed to her primary contact #.  Summary: diarrhea and nausea   Copied From CRM [id number]. Reason for Triage: diarrhea and nausea         Reason for Disposition  [1] Constant abdominal pain AND [2] present > 2 hours  Answer Assessment - Initial Assessment Questions 1. DIARRHEA SEVERITY: "How bad is the diarrhea?" "How many more stools have you had in the past 24 hours than normal?"    - NO DIARRHEA (SCALE 0)   - MILD (SCALE 1-3): Few loose or mushy BMs; increase of 1-3 stools over normal daily number of stools; mild increase in ostomy output.   -  MODERATE (SCALE 4-7): Increase of 4-6 stools daily over normal; moderate increase in ostomy output.   -  SEVERE (SCALE 8-10; OR "WORST POSSIBLE"): Increase of 7 or more stools daily over normal; moderate increase in ostomy output; incontinence.     6 episodes this morning and states she usually goes 1-2 times a day. Mild to moderate.  2. ONSET: "When did the diarrhea begin?"      This morning around 0200.  3. BM CONSISTENCY: "How loose or watery is the diarrhea?"      Combination of both.  4.  VOMITING: "Are you also vomiting?" If Yes, ask: "How many times in the past 24 hours?"      Denies.  5. ABDOMEN PAIN: "Are you having any abdomen pain?" If Yes, ask: "What does it feel like?" (e.g., crampy, dull, intermittent, constant)      Yes, crampy.  6. ABDOMEN PAIN SEVERITY: If present, ask: "How bad is the pain?"  (e.g., Scale 1-10; mild, moderate, or severe)   - MILD (1-3): doesn't interfere with normal activities, abdomen soft and not tender to touch    - MODERATE (4-7): interferes with normal activities or awakens from sleep, abdomen tender to touch    - SEVERE (8-10): excruciating pain, doubled over, unable to do any normal activities       7-8/10. Upper abdomen.  7. ORAL INTAKE: If vomiting, "Have you been able to drink liquids?" "How much liquids have you had in the past 24 hours?"     Yes, she states she is trying to stay well hydrated. She states she has had a lot of water (16+ oz) and half a cup of black coffee.  8. HYDRATION: "Any signs of dehydration?" (e.g., dry mouth [not just dry lips], too weak to stand, dizziness, new weight loss) "When did you last urinate?"     Denies. States she urinated this morning.  9. EXPOSURE: "Have you traveled to a foreign country recently?" "Have you been exposed to anyone with diarrhea?" "Could you have eaten any food that was spoiled?"     Denies.  10.  ANTIBIOTIC USE: "Are you taking antibiotics now or have you taken antibiotics in the past 2 months?"       No.  11. OTHER SYMPTOMS: "Do you have any other symptoms?" (e.g., fever, blood in stool)       Nausea, belching.  12. PREGNANCY: "Is there any chance you are pregnant?" "When was your last menstrual period?"       N/A.  Protocols used: [facility]

## 2023-10-25 ENCOUNTER — Ambulatory Visit: Payer: HMO | Admitting: Psychiatry

## 2023-10-29 ENCOUNTER — Telehealth: Payer: Self-pay | Admitting: *Deleted

## 2023-10-29 NOTE — Telephone Encounter (Unsigned)
 Copied from CRM [id number]. Topic: General - Other >> Oct 29, 2023 11:41 AM Orien Bird wrote: Reason for CRM: patient called stating her ortho dr sent dr a letter for her to release her so that she can have surgery she stated that he has sent over a fax form nd she is wanting to know if she has done so it's to have knee replacement.

## 2023-10-30 ENCOUNTER — Other Ambulatory Visit: Payer: Self-pay | Admitting: Pulmonary Disease

## 2023-10-30 ENCOUNTER — Encounter: Payer: Self-pay | Admitting: Family Medicine

## 2023-10-30 ENCOUNTER — Other Ambulatory Visit: Payer: Self-pay | Admitting: Cardiovascular Disease

## 2023-10-30 ENCOUNTER — Other Ambulatory Visit: Payer: Self-pay | Admitting: Psychiatry

## 2023-10-30 ENCOUNTER — Ambulatory Visit: Attending: Cardiology | Admitting: Emergency Medicine

## 2023-10-30 DIAGNOSIS — F4310 Post-traumatic stress disorder, unspecified: Secondary | ICD-10-CM

## 2023-10-30 DIAGNOSIS — Z01810 Encounter for preprocedural cardiovascular examination: Secondary | ICD-10-CM

## 2023-10-30 NOTE — Telephone Encounter (Signed)
 See previous phone note.

## 2023-10-30 NOTE — Progress Notes (Signed)
 Virtual Visit via Telephone Note   Because of Brittany [name] co-morbid illnesses, she is at least at moderate risk for complications without adequate follow up.  This format is felt to be most appropriate for this patient at this time.  Due to technical limitations with video connection (technology), today's appointment will be conducted as an audio only telehealth visit, and Brittany [name] verbally agreed to proceed in this manner.   All issues noted in this document were discussed and addressed.  No physical exam could be performed with this format.  Evaluation Performed:  Preoperative cardiovascular risk assessment _____________   Date:  10/30/2023   Patient ID:  Brittany [name], DOB January 12, 1956, MRN [id number] Patient Location:  Home Provider location:   Office  Primary Care Provider:  Aida House, MD Primary Cardiologist:  Janelle Mediate, MD  Chief Complaint / Patient Profile   68 y.o. y/o female with a h/o coronary artery disease, RBBB, asthma, PTSD, hyperlipidemia, chronic dyspnea who is pending total knee arthroplasty with [facility] by Dr. Leighton Punches on date TBD and presents today for telephonic preoperative cardiovascular risk assessment.  History of Present Illness    Brittany [name] is a 68 y.o. female who presents via audio/video conferencing for a telehealth visit today.  Pt was last seen in cardiology clinic on 11/22/2022 by Dr. Stann Earnest.  At that time Brittany [name] was doing well.  The patient is now pending procedure as outlined above. Since her last visit, she denies chest pain, shortness of breath, lower extremity edema, fatigue, palpitations, melena, hematuria, hemoptysis, diaphoresis, weakness, presyncope, syncope, orthopnea, and PND.  Today patient is doing well overall.  She denies any acute cardiovascular concerns or complaints over the past year.  She notes that she does stay fairly active.  She used to walk her dogs 3 miles a day now due to knee pain she is not  able to walk as much.  She does stay active with her grandchildren however.  She is without any anginal symptoms.  She is able to complete greater than 4 METS. Past Medical History    Past Medical History:  Diagnosis Date   Abnormal EKG    Allergy    Anxiety    Arthritis    Asthma    Bipolar depression (HCC), Anxiety, PTSD, Panic disorder    -managed by [facility]   CAD (coronary artery disease)    Depression    Foot pain    Gallstones    GERD (gastroesophageal reflux disease)    Heart murmur    as a child   HLD (hyperlipidemia)    Insomnia    Leg swelling    Obesity    Osteoporosis    osteopenia   Pneumonia due to COVID-19 virus 04/22/2019   Tachycardia    Past Surgical History:  Procedure Laterality Date   BREAST BIOPSY Right    biopsy of nipple   CARPAL TUNNEL RELEASE Left    CESAREAN SECTION     x 2   CHOLECYSTECTOMY     COLONOSCOPY     ENDOMETRIAL ABLATION     EXCISION MORTON'S NEUROMA Left 01/11/2022   Procedure: SECOND WEBSPACE MORTON'S NEUROMA EXCISION;  Surgeon: Amada Backer, MD;  Location: [facility];  Service: Orthopedics;  Laterality: Left;   FOOT SURGERY Left    HAMMER TOE SURGERY Left 01/11/2022   Procedure: COLLATERAL LIGAMENT REPAIRS WITH SECOND AND THIRD HAMMER TOE CORRECTION;  Surgeon: Amada Backer, MD;  Location: [facility];  Service: Orthopedics;  Laterality: Left;   KNEE SURGERY Right    reconstruction for patellar dislocation   LAPAROSCOPY     x 2   TOENAIL EXCISION Left 01/11/2022   Procedure: HALLUX TOENAIL PERMANENT EXCISION;  Surgeon: Amada Backer, MD;  Location: [facility];  Service: Orthopedics;  Laterality: Left;   TUBAL LIGATION     ULNAR NERVE REPAIR Left    UPPER GASTROINTESTINAL ENDOSCOPY     WEIL OSTEOTOMY Left 01/11/2022   Procedure: SECOND, THIRD AND FOURTH WEIL OSTEOTOMIES;  Surgeon: Amada Backer, MD;  Location: [facility];  Service: Orthopedics;   Laterality: Left;    Allergies  Allergies  Allergen Reactions   Aspirin Other (See Comments)    discomfort   Atorvastatin  Other (See Comments)    Patient states she felt suicidal   Codeine Itching   Crestor  [Rosuvastatin ] Other (See Comments)    Dizzy, leg pain and states she felt suicidal   Gabapentin  Other (See Comments)   Niacin And Related Itching   Sulfa Antibiotics Other (See Comments)    Other reaction(s): Unknown   Sulfamethoxazole-Trimethoprim Other (See Comments)    Unknown reaction per pt   Topiramate  Other (See Comments)    Per patient, it causes stomach burning   Trileptal  [Oxcarbazepine ] Itching    itching   Trimethoprim Other (See Comments)    Unknown, per pt   Ultram [Tramadol] Itching   Vioxx [Rofecoxib] Other (See Comments)    Unknown per pt    Home Medications    Prior to Admission medications   Medication Sig Start Date End Date Taking? Authorizing Provider  albuterol  (PROVENTIL ) (2.5 MG/3ML) 0.083% nebulizer solution Take 3 mLs (2.5 mg total) by nebulization every 6 (six) hours as needed for wheezing or shortness of breath. 11/15/22   Wilfredo Hanly, MD  Albuterol  Sulfate (PROAIR  RESPICLICK) 108 (90 Base) MCG/ACT AEPB Inhale 2 puffs into the lungs every 6 (six) hours as needed. 05/27/23   Wilfredo Hanly, MD  Ascorbic Acid  (VITA-C PO) Take 1 tablet by mouth daily.    [provider]  Azelastine  HCl 137 MCG/SPRAY SOLN SPRAY 1 SPRAY IN EACH NOSTRIL TWICE DAILY 09/26/23   Dewald, Jonathan B, MD  buPROPion  (WELLBUTRIN  XL) 150 MG 24 hr tablet Take 1 tablet (150 mg total) by mouth daily for 7 days, THEN 2 tablets (300 mg total) daily. Patient not taking: Reported on 10/15/2023 10/15/23 11/21/23  Jonda Neighbours., MD  busPIRone  (BUSPAR ) 15 MG tablet Take 1 tablet (15 mg total) by mouth 3 (three) times daily. 10/15/23   Cottle, Kennedy Peabody., MD  cetirizine  (ZYRTEC ) 10 MG tablet Take 1 tablet (10 mg total) by mouth daily. 07/20/19   Joesph Mussel, DO   Cholecalciferol (VITAMIN D -3 PO) Take 10,000 Units by mouth daily.     [provider]  clonazePAM  (KLONOPIN ) 0.5 MG tablet TAKE 1 TABLET BY MOUTH 2 TIMES A DAY AND TAKE TWO TABLETS BY MOUTH AT BEDTIME 10/15/23   Cottle, Kennedy Peabody., MD  dicyclomine  (BENTYL ) 10 MG capsule TAKE 1 CAPSULE BY MOUTH 3 TIMES A DAY AS NEEDED FOR SPASMS 09/26/23   May, Deanna J, NP  dicyclomine  (BENTYL ) 20 MG tablet TAKE ONE TABLET BY MOUTH FOUR TIMES A DAY AS NEEDED FOR SPASMS 11/27/21   Lindle Rhea, MD  doxazosin  (CARDURA ) 4 MG tablet Take 2 tablets (8 mg total) by mouth at bedtime. 10/15/23   Cottle, Kennedy Peabody., MD  Evolocumab  (REPATHA  SURECLICK) 140 MG/ML SOAJ Inject 140 mg into the skin every 14 (fourteen) days. 09/11/23   Loyde Rule, MD  ezetimibe  (ZETIA ) 10 MG tablet Take 1 tablet (10 mg total) by mouth daily. 12/18/22 09/12/23  Loyde Rule, MD  famotidine  (PEPCID ) 20 MG tablet Take 1 tablet (20 mg total) by mouth at bedtime. 07/02/23   May, Deanna J, NP  fluticasone  (FLONASE ) 50 MCG/ACT nasal spray USE 2 SPRAYS INTO BOTH NOSTRILS TWICE DAILY 08/19/23   Wilfredo Hanly, MD  fluticasone -salmeterol (ADVAIR) 250-50 MCG/ACT AEPB Inhale 1 puff into the lungs every 12 (twelve) hours. 05/27/23   Wilfredo Hanly, MD  Menaquinone-7 (VITAMIN K2) 100 MCG CAPS Take 1 capsule by mouth daily. 06/19/23   [provider]  mirtazapine  (REMERON ) 45 MG tablet Take 1 tablet (45 mg total) by mouth at bedtime. 06/14/23   Cottle, Kennedy Peabody., MD  montelukast  (SINGULAIR ) 10 MG tablet Take 1 tablet (10 mg total) by mouth at bedtime. 11/15/22   Wilfredo Hanly, MD  Multiple Vitamin (MULTIVITAMIN WITH MINERALS) TABS tablet Take 1 tablet by mouth daily.    [provider]  Omega-3 Fatty Acids (OMEGA 3 PO) Take 1 capsule by mouth daily. 06/19/23   [provider]  pantoprazole  (PROTONIX ) 40 MG tablet Take 1 tablet (40 mg total) by mouth daily. 10/23/22   Aida House, MD  sodium chloride  (OCEAN)  0.65 % SOLN nasal spray Place 1 spray into both nostrils as needed for congestion.    [provider]    Physical Exam    Vital Signs:  Brittany [name] does not have vital signs available for review today.  Given telephonic nature of communication, physical exam is limited. AAOx3. NAD. Normal affect.  Speech and respirations are unlabored.  Accessory Clinical Findings    None  Assessment & Plan    1.  Preoperative Cardiovascular Risk Assessment: According to the Revised Cardiac Risk Index (RCRI), her Perioperative Risk of Major Cardiac Event is (%): 0.4. Her Functional Capacity in METs is: 6.05 according to the Duke Activity Status Index (DASI). Therefore, based on ACC/AHA guidelines, patient would be at acceptable risk for the planned procedure without further cardiovascular testing.  The patient was advised that if she develops new symptoms prior to surgery to contact our office to arrange for a follow-up visit, and she verbalized understanding.  A copy of this note will be routed to requesting surgeon.  Time:   Today, I have spent 8 minutes with the patient with telehealth technology discussing medical history, symptoms, and management plan.     Ava Boatman, NP  10/30/2023, 10:34 AM

## 2023-10-30 NOTE — Telephone Encounter (Signed)
 Pt missed her appointment with me on 5/8, I can fill it out but I will need to use the previous visit in November.

## 2023-11-01 ENCOUNTER — Ambulatory Visit: Admitting: Psychiatry

## 2023-11-01 DIAGNOSIS — F4310 Post-traumatic stress disorder, unspecified: Secondary | ICD-10-CM

## 2023-11-01 NOTE — Progress Notes (Signed)
 [facility] Counselor/Therapist Progress Note  Patient ID: Brittany [name], MRN: [id number],    Date: 11/01/2023  Time Spent: 55 minutes   Treatment Type: Individual Therapy  Reported Symptoms: anxiety, depression "some better after getting on Wellbutrin  and also having more motivation, overthinking, some loneliness, trust issues and sensitivity to "rejection" due to "my PTSD "which has been a part of my life for approx 42-45 years".     Mental Status Exam:  Appearance:   Casual     Behavior:  Appropriate, Sharing, and Motivated  Motor:  Some issues with her right knee and will be having knee replacement surgery  Speech/Language:   Clear and Coherent  Affect:  Depressed and anxiety  Mood:  anxious and some depression  Thought process:  goal directed  Thought content:    Ruminating some better  Sensory/Perceptual disturbances:    WNL  Orientation:  oriented to person, place, time/date, situation, day of week, month of year, year, and stated date of Nov 01, 2023  Attention:  Good  Concentration:  Good and Fair  Memory:  WNL  Fund of knowledge:   Good  Insight:    Good and Fair  Judgment:   Good and Fair  Impulse Control:  Good   Risk Assessment: Danger to Self:  No Self-injurious Behavior: No Danger to Others: No Duty to Warn:no Physical Aggression / Violence:No  Access to Firearms a concern: No  Gang Involvement:No    Subjective: Patient showing good motivation, "better than it used to be and I think it may relate to med change to Wellbutrin  and following through on strategies from therapy." Working further on her anxiety, depression "a little better", trying to not overthink as much,  and not be so easily hurt by others "and I'm still working on this." Continued work on relationship ship issues with a sister that has been a Designer, multimedia for patient, and has been more recently working on in and outside of therapy. Still easy to feel hurt by others and continued work  on this today, and trying to let go more in order to move in a forward direction. Anticipating the scheduling of her knee replacement surgery at some point soon. Shared that her loneliness has decreased some.. Working on relationship issues that arise particularly with one friend where patient has had to set some healthy boundaries. Several friends at her apt complex. States she is improving in not making negative assumptions so much and accept they may not be based in fact/truth. Continue to encourage patient to reduce time playing games on her phone and have more time to make contact with others including more positive time with family.   Interventions: Cognitive Behavioral Therapy and Ego-Supportive 1) Long Term Goal: Develop and follow through on strategies to reduce symptoms.     Short Term Goal: Reduce anxiety and improve coping skills.     Objective: Learn two new ways of coping with daily stressors.     Objective: Develop strategies for thought distraction when fixating on the future.     Objective: Manage panic episodes. 2) Long Term Goal: Maintain stability of mood     Short Term Goal: Increase ability to manage moods.     Objective: Discuss and resolve troubling personal and interpersonal issues.   Diagnosis:   ICD-10-CM   1. PTSD (post-traumatic stress disorder)  F43.10      Plan:    Patient in session today working further on goal-directed behaviors targeting her healing  from the past and having a more hopeful and positive outlook currently and into the future.  Struggled over the years with PTSD and bipolar issues and states today that she feels currently she is mostly dealing with symptoms that relate to her history of PTSD.  Due to significant difficulties in her past, progress has not always been fast nor predictable, but patient continues to work with treatment goals in sessions and outside of sessions including working to not make broad assumptions that are negative or  discouraging.  It took some time to develop some healthier positive rapport with patient as she has struggled with this in the past.  She seems to be at a better place with that which has helped her trust, and also at times her motivation and follow-through regarding treatment focused objectives.  Negative thought patterns are not quite as strong at times and she is able to identify some positives at most sessions although it can be challenging to trust the positives and the commitment that other people have and working with her and also in friendships and mutual caring.  Her past history from earlier in her life up through now has certainly been a heavy influence in some of the struggles, however patient has made and continues to work towards making more progress.  Focusing on decreasing negative thought patterns, seeing the positives as quickly as she with the negatives and emphasizing the positives more, improving her own self-care, setting limits with people who are not healthy for her and connecting more with people who are healthy for her, and working to recognize more quickly her overthinking and decrease it, while also interrupting some of her assumptions "as to how things are or how they have to be" and instead be able to look at some situations in different ways that might help her develop more hope and a stronger sense of herself. Encouraged patient in her working towards having a healthier mindset emotionally and physically, better money management, use of more positive/encouraging self talk, remain involved in activities that nurture and help her to maintain stability in mood, refrain from identifying herself as a failure, refrain from getting overly fixated on the future, continue working with strategies regarding thought distractions, stay in the present versus the past, emphasize her positives, practice the palms as needed, let her faith be a resource for emotional stability as well as spiritual,  understand that her anxious thoughts are "just thoughts" and are not necessarily going to play out in reality the way she might fear or assume, recognize the strengths she shows working with goal-directed behaviors to move in a direction of improved emotional health, working towards increased hopefulness, and her overall wellbeing.  Goal review and progress/challenges noted with patient.  Next appointment within 2 weeks.   Kelleen Patee, LCSW

## 2023-11-07 ENCOUNTER — Telehealth: Payer: Self-pay | Admitting: *Deleted

## 2023-11-07 ENCOUNTER — Ambulatory Visit: Admitting: Psychiatry

## 2023-11-07 DIAGNOSIS — F4310 Post-traumatic stress disorder, unspecified: Secondary | ICD-10-CM

## 2023-11-07 NOTE — Telephone Encounter (Signed)
 Copied from CRM [id number]. Topic: General - Other >> Nov 07, 2023  3:01 PM Abigail D wrote: Reason for CRM: Brittany [name] with [facility] called to check the status of Brittany [name]'s surgery clearance, they need this sent over so that she can be scheduled for her surgery. Phone: [phone] fax: [phone]

## 2023-11-07 NOTE — Progress Notes (Signed)
 [facility] Counselor/Therapist Progress Note  Patient ID: Brittany [name], MRN: [id number],    Date: 11/07/2023  Time Spent: 53 minutes   Treatment Type: Individual Therapy  Reported Symptoms: anxiety "some worse", depression improving, feels Wellbutin may be helping with my motivation, overthinking, some loneliness, some trust issues and sensitivity to rejection "due to my PTSD" which has been a part of my life 42-45 yrs.   Mental Status Exam:  Appearance:   Casual and Neat     Behavior:  Appropriate, Sharing, and Motivated  Motor:  Normal  Speech/Language:   Clear and Coherent  Affect:  Depressed and anxious  Mood:  anxious and depressed  Thought process:  goal directed  Thought content:    Rumination  Sensory/Perceptual disturbances:    WNL  Orientation:  oriented to person, place, time/date, situation, day of week, month of year, year, and stated date of Nov 07, 2023  Attention:  Good  Concentration:  Good and Fair  Memory:  WNL  Fund of knowledge:   Good  Insight:    Good and Fair  Judgment:   Good and Fair  Impulse Control:  Good and Fair   Risk Assessment: Danger to Self:  No Self-injurious Behavior: No Danger to Others: No Duty to Warn:no Physical Aggression / Violence:No  Access to Firearms a concern: No  Gang Involvement:No   Subjective:  Patient in session today continuing to work on improved motivation, having healthier boundaries. Feels Wellbutrin  is helping some "but wanting it to help more."  Feels that her PTSD is "mostly what has led me to have the problems I have" and explained how that is her way of viewing her past and its influence on the present.  Shares that she is trying to do more enjoyable activities as discussed previously in sessions.  Enjoying crafting and still getting together occasionally in groups of people including at her apartment complex. Continues to work on her depression and anxiety, decreasing her overthinking, and not be so  easily hurt by others. States she needs to be more motivated and wonders about her medication that should "be motivating me more". Finding it difficult to stay motivated and working today on this, including interrupting some of her negative thoughts, letting patient help name things that  help her feel more positive.Working more with her negative thoughts and trying to "re-think" some of her old ways of looking at things, realizing how they limit her and negatively impact her mood and her feelings about herself and others at times.  Loneliness overall she states has decreased.  Some decreased resistance to working on some changes that would be positive for her and commented on this with patient encouraging her to continue.  Is expected to have knee surgery sometime in the near future but has not been given a date yet.  Some increased motivation noted and responded well to coaching her more in this direction of being motivated and trying things that she may not have tried before in order to make positive changes.  Spending a little more time with some of her neighbors at her apartment complex.  Also try not to make negative assumptions as much and work more on believing in herself and her ability to make changes even when they are difficult.  Interventions: Cognitive Behavioral Therapy and Ego-Supportive 1) Long Term Goal: Develop and follow through on strategies to reduce symptoms.     Short Term Goal: Reduce anxiety and improve coping skills.  Objective: Learn two new ways of coping with daily stressors.     Objective: Develop strategies for thought distraction when fixating on the future.     Objective: Manage panic episodes. 2) Long Term Goal: Maintain stability of mood     Short Term Goal: Increase ability to manage moods.     Objective: Discuss and resolve troubling personal and interpersonal issues.    Diagnosis:   ICD-10-CM   1. PTSD (post-traumatic stress disorder)  F43.10      Plan:  Patient today actively participating in session as we worked further on goal-directed behaviors related to her PTSD and bipolar issues, as well as some self-esteem challenges and having healthy boundaries as needed with others.  Patient has been showing better perseverance and working on treatment goals as in the past she has had difficulty because progress was not as fast nor predictable as she would have liked.  Encouraging her to refrain from making or assuming broad assumptions that tend to be negative and discouraging to patient.  Does seem to be working more effectively at this point on her goals and challenges, noticing some improvement.  Her negative thought patterns are not quite as strong and she is able to, with encouragement, challenge some of them which has been more difficult for her in the past.  Wanting relationships and continuing to struggle and work with her difficulty trusting at times and how this can limit her.  Understanding better how some of her history has made trusting more difficult for patient and does feel that she has made some progress which is led to some improvement in this area.  Working to decrease negative thought patterns, emphasizing the positives more than the negatives, improving her self-care, setting limits with people who are not healthy for her and connecting more with people who are healthy for her, recognizing her overthinking at times and trying to interrupt it, interrupting some of her own assumptions as to "how things are or how they have to be" and instead be able to look at situations in different ways that might help her develop more hope and a stronger sense of herself.  Continues to try to have a healthier mindset emotionally and physically, trying to practice better money management, use of more positive/encouraging self talk, stay involved in activities that nurture and help her to maintain stability in mood and also interactions with other people, refrain  from identifying herself as a failure, refrain from getting overly fixated on the future, continue working with strategies regarding thought distractions, remain in the present versus the past, emphasize her positives, practice "the pause" as needed, let her faith be a resource for emotional stability as well as spiritual, understand that her anxious thoughts are "just thoughts" and are not necessarily going to play out in reality the way she might fear or assume, and realize the strength she shows working with goal-directed behaviors to move in a direction of improved emotional health, increased hopefulness, and her overall wellbeing now and into the future.  Goal review and progress/challenges noted with patient.  Next appointment within 2 weeks.   Kelleen Patee, LCSW

## 2023-11-07 NOTE — Telephone Encounter (Signed)
 Copied from CRM [id number]. Topic: General - Other >> Nov 07, 2023  2:50 PM [name] wrote: Reason for CRM: Patient request that Brittany [name] refax paperwork to [facility]/ Dr Rubbie Cornwall office so she can get her procedure scheduled. Please call patient when paperwork is faxed.

## 2023-11-08 NOTE — Telephone Encounter (Signed)
 See prior phone note.

## 2023-11-08 NOTE — Telephone Encounter (Signed)
 Spoke with Brittany [name] and informed her the clearance was faxed to the surgical scheduler Sherri on 5/14.  Form was sent to be scanned and is not available to refax.  Brittany [name] stated she will check with the surgery scheduler.

## 2023-11-13 ENCOUNTER — Telehealth: Payer: Self-pay | Admitting: *Deleted

## 2023-11-13 NOTE — Telephone Encounter (Signed)
 Noted the clearance form has been scanned into the chart.  This was printed, refaxed to [facility]-attn: Leron Rankins at [phone] and I left a message on Lori's voicemail with this information.

## 2023-11-13 NOTE — Telephone Encounter (Signed)
 Copied from CRM [id number]. Topic: General - Other >> Nov 13, 2023  9:38 AM Earnestine Goes B wrote: Reason for CRM: [facility] Cowing with [facility] called to follow up on surgery clearance for pt. Requesting a clearance is faxed to [phone], Phone [phone] Urgent waiting for clearance to schedule pt for surgery. Please call back .

## 2023-11-19 ENCOUNTER — Ambulatory Visit: Payer: Self-pay | Admitting: Orthopedic Surgery

## 2023-11-22 ENCOUNTER — Ambulatory Visit: Admitting: Psychiatry

## 2023-11-22 DIAGNOSIS — F3162 Bipolar disorder, current episode mixed, moderate: Secondary | ICD-10-CM

## 2023-11-22 NOTE — Progress Notes (Signed)
 [facility] Counselor/Therapist Progress Note  Patient ID: Brittany [name], MRN: [id number],    Date: 11/22/2023  Time Spent: 55 minutes   Treatment Type: Individual Therapy  Reported Symptoms: anxiety, depression improving some, overthinking, motivation improving and feels the Wellbutrin  might be helping that some, loneliness improved some, some trust issues and sensitivity to rejection "due to my PTSD which has been a part of my life 42-45 yrs"   Mental Status Exam:  Appearance:   Casual     Behavior:  Appropriate, Sharing, and Motivated  Motor:  Impacted by knee issue  Speech/Language:   Clear and Coherent  Affect:  Depressed and anxious  Mood:  anxious and depressed  Thought process:  goal directed  Thought content:    Obsessions, Rumination, and it's worse due to upcoming surgery and other things in her life going on  Sensory/Perceptual disturbances:    WNL  Orientation:  oriented to person, place, time/date, situation, day of week, month of year, year, and stated date of November 22, 2023  Attention:  Good  Concentration:  Good and Fair  Memory:  WNL  Fund of knowledge:   Good  Insight:    Good and Fair  Judgment:   Good  Impulse Control:  Fair   Risk Assessment: Danger to Self:  No Self-injurious Behavior: No Danger to Others: No Duty to Warn:no Physical Aggression / Violence:No  Access to Firearms a concern: No  Gang Involvement:No   Subjective:   Patient today actively participating in session as she works further on healthier boundaries, improving her motivation, and working to improve her self-esteem.  Doing some increase in willingness to try some strategies she has not tried before rather than assume something will not work for her.  States that her loneliness overall has decreased some.  Continues working in session and outside of session on negative thought patterns and trying to "re-think "old ways of viewing things and better understanding how those old ways  limited her and was a negative impact on her mood particularly how she would end up feeling about herself and having some trust issues with other people.  *Angry at herself "for letting myself gain weight" and hoping after recovery from upcoming surgery that she can focus more on her overall health." Getting together occasionally with other groups of people at her apartment complex to do crafting which she enjoys.  Does have some motivational challenges at times but tries to interrupt negative thoughts that get in her way.  Recognizes her need to continue working on her depression and anxiety, relationship skills and not being so easily hurt by others especially when that may not be their intention, trying not to jump to conclusions, and decreasing her overthinking.  Decreased resistance to working on some changes that could be positive for her.  Trying to decrease her negative assumptions and work on having more belief in herself and her ability to have more insight and work to make changes that can lead to her feeling better about herself and herself in relationship to others.  A significant issue for patient right now is her upcoming knee surgery, and she needed some of our time today to more fully share her concerns about her upcoming surgery, and "trying not to overthink nor overstress over it."   Interventions: Cognitive Behavioral Therapy, Solution-Oriented/Positive Psychology, and Ego-Supportive 1) Long Term Goal: Develop and follow through on strategies to reduce symptoms.     Short Term Goal: Reduce anxiety and  improve coping skills.     Objective: Learn two new ways of coping with daily stressors.     Objective: Develop strategies for thought distraction when fixating on the future.     Objective: Manage panic episodes. 2) Long Term Goal: Maintain stability of mood     Short Term Goal: Increase ability to manage moods.     Objective: Discuss and resolve troubling personal and interpersonal  issues.    Diagnosis:   ICD-10-CM   1. Bipolar 1 disorder, mixed, moderate (HCC)  F31.62      Plan:   Patient today working well in session, picking up on her work from the previous session, and staying focused on goal-directed behaviors related to bipolar issues, self-esteem challenges and the use of healthy boundaries with others.  She has definitely been putting forth more effort in sessions and outside of sessions per her report, and that effort is obvious in her sessions as a check-in about what all has happened in between sessions especially.  Patient also sensing some of her progress including: Not making as many broad assumptions that tend to be negative and discouraging to herself, staying more focused on her goals and challenges, negative thought patterns not being as strong and able to challenge them particular with some encouragement, and letting herself feel good about any progress noticed.  Continued work on her own self-esteem and and desired relationships which is a struggle for her as she has difficulty trusting but also can be challenging for her as far as interpersonal skills and expectations in a relationship, based on situations patient has shared in sessions.  Difficulty trusting although that seems to have improved some.  Continues working on some of her history that has made trust more difficult, looking at certain behaviors that do not work too well in relationships, and work towards some healthier Theatre manager.  Her negative thought patterns have decreased some as we have tried to emphasize the positives more than the negatives, and encouraged patient in setting limits with people who are not healthy for her and trying to connect more with people who are healthy for her.  Is recognizing her overthinking more quickly and at times able to interrupt it and let go of the rigid thoughts of "how things have to be" and instead look at situations in a more open and hopeful way.   Trying to practice better money management, refrain from identifying herself as a failure, refrain from jumping too far into the future or going backwards and getting stuck in the past, practicing "the pause" which has been helpful at times, letting her faith be a resource for emotional stability as well as spiritual, better understand that her anxious thoughts are "just thoughts" and are not necessarily going to play out in reality the way she might assume or fear, and recognize the strength she shows working with her goal-directed behaviors trying to move in a direction of improved emotional health, increased hopefulness, and her overall wellbeing.  Goal review and progress/challenges noted with patient.  Next appointment within 2 weeks.   Kelleen Patee, LCSW

## 2023-12-02 ENCOUNTER — Ambulatory Visit: Admitting: Family Medicine

## 2023-12-02 ENCOUNTER — Encounter: Payer: Self-pay | Admitting: Family Medicine

## 2023-12-02 VITALS — BP 102/60 | HR 87 | Temp 98.4°F | Ht 61.75 in | Wt 190.4 lb

## 2023-12-02 DIAGNOSIS — K219 Gastro-esophageal reflux disease without esophagitis: Secondary | ICD-10-CM

## 2023-12-02 DIAGNOSIS — Z0000 Encounter for general adult medical examination without abnormal findings: Secondary | ICD-10-CM | POA: Diagnosis not present

## 2023-12-02 MED ORDER — PANTOPRAZOLE SODIUM 40 MG PO TBEC: 40.0000 mg | DELAYED_RELEASE_TABLET | Freq: Every day | ORAL | 3 refills | Status: DC

## 2023-12-02 NOTE — Progress Notes (Signed)
 Complete physical exam  Patient: Brittany [name]   DOB: [birth year]   68 y.o. Female  MRN: [id number]  Subjective:    Chief Complaint  Patient presents with   Annual Exam    Brittany [name] is a 68 y.o. female who presents today for a complete physical exam. She reports consuming a low sugar diet. Exercise is limited by orthopedic condition(s): Knee OA, states that she is having a knee replacement next month. She generally feels well. She reports sleeping fairly well, takes mirtazapine  at night to help with sleep. She does not have additional problems to discuss today.    Most recent fall risk assessment:    12/02/2023    3:39 PM  Fall Risk   Falls in the past year? 0  Number falls in past yr: 0  Injury with Fall? 0  Risk for fall due to : Impaired balance/gait  Follow up Falls evaluation completed     Most recent depression screenings:    12/02/2023    3:40 PM 05/30/2023   11:11 AM  PHQ 2/9 Scores  PHQ - 2 Score 2   PHQ- 9 Score 10   Exception Documentation  Other- indicate reason in comment box  Not completed  sees psychiatry and therapist for managment, stable    Vision:Not within last year  and Dental: No current dental problems and Receives regular dental care  Patient Active Problem List   Diagnosis Date Noted   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   Healthy adult on routine physical examination 04/25/2022   Asthma exacerbation 04/09/2022   Small intestinal bacterial overgrowth (SIBO) 04/09/2022   Fatigue 01/08/2022   Morbid obesity (HCC) 01/08/2022   Pulmonary fibrosis (HCC) 04/06/2020   Dizziness 02/11/2020   Melena 04/22/2019   Suprapubic abdominal pain 04/22/2019   Osteopenia 12/04/2016   Chronic post-traumatic stress disorder (PTSD) 08/23/2016   Bipolar affective disorder, current episode depressed (HCC) 08/23/2016   Hyperlipidemia 08/23/2016   COPD / AB vs ACOS 06/22/2016   Cough variant asthma  vs UACS  06/21/2016   GERD 04/03/2007      Patient Care  Team: Ozell Heron [name], MD as PCP - General (Family Medicine) Delford Maude BROCKS, MD as PCP - Cardiology (Cardiology)   Outpatient Medications Prior to Visit  Medication Sig   albuterol  (PROVENTIL ) (2.5 MG/3ML) 0.083% nebulizer solution Take 3 mLs (2.5 mg total) by nebulization every 6 (six) hours as needed for wheezing or shortness of breath.   Albuterol  Sulfate (PROAIR  RESPICLICK) 108 (90 Base) MCG/ACT AEPB Inhale 2 puffs into the lungs every 6 (six) hours as needed.   Ascorbic Acid  (VITA-C PO) Take 1 tablet by mouth daily.   Azelastine  HCl 137 MCG/SPRAY SOLN SPRAY 1 SPRAY IN EACH NOSTRIL TWICE DAILY   buPROPion  (WELLBUTRIN  XL) 150 MG 24 hr tablet Take 2 tablets (300 mg total) by mouth daily.   busPIRone  (BUSPAR ) 15 MG tablet Take 1 tablet (15 mg total) by mouth 3 (three) times daily.   cetirizine  (ZYRTEC ) 10 MG tablet Take 1 tablet (10 mg total) by mouth daily.   Cholecalciferol (VITAMIN D -3 PO) Take 10,000 Units by mouth daily.    clonazePAM  (KLONOPIN ) 0.5 MG tablet TAKE 1 TABLET BY MOUTH 2 TIMES A DAY AND TAKE TWO TABLETS BY MOUTH AT BEDTIME   dicyclomine  (BENTYL ) 10 MG capsule TAKE 1 CAPSULE BY MOUTH 3 TIMES A DAY AS NEEDED FOR SPASMS   doxazosin  (CARDURA ) 4 MG tablet Take 2 tablets (8 mg total) by mouth at bedtime.  Evolocumab  (REPATHA  SURECLICK) 140 MG/ML SOAJ Inject 140 mg into the skin every 14 (fourteen) days.   ezetimibe  (ZETIA ) 10 MG tablet TAKE 1 TABLET BY MOUTH DAILY   famotidine  (PEPCID ) 20 MG tablet Take 1 tablet (20 mg total) by mouth at bedtime.   fluticasone  (FLONASE ) 50 MCG/ACT nasal spray USE 2 SPRAYS INTO BOTH NOSTRILS TWICE DAILY   fluticasone -salmeterol (ADVAIR) 250-50 MCG/ACT AEPB Inhale 1 puff into the lungs every 12 (twelve) hours.   Menaquinone-7 (VITAMIN K2) 100 MCG CAPS Take 1 capsule by mouth daily.   mirtazapine  (REMERON ) 45 MG tablet Take 1 tablet (45 mg total) by mouth at bedtime.   montelukast  (SINGULAIR ) 10 MG tablet TAKE 1 TABLET BY MOUTH AT BEDTIME    Multiple Vitamin (MULTIVITAMIN WITH MINERALS) TABS tablet Take 1 tablet by mouth daily.   Omega-3 Fatty Acids (OMEGA 3 PO) Take 1 capsule by mouth daily.   sodium chloride  (OCEAN) 0.65 % SOLN nasal spray Place 1 spray into both nostrils as needed for congestion.   [DISCONTINUED] dicyclomine  (BENTYL ) 20 MG tablet TAKE ONE TABLET BY MOUTH FOUR TIMES A DAY AS NEEDED FOR SPASMS   [DISCONTINUED] pantoprazole  (PROTONIX ) 40 MG tablet Take 1 tablet (40 mg total) by mouth daily.   No facility-administered medications prior to visit.    Review of Systems  HENT:  Negative for hearing loss.   Eyes:  Negative for blurred vision.  Respiratory:  Negative for shortness of breath.   Cardiovascular:  Negative for chest pain.  Gastrointestinal: Negative.   Genitourinary: Negative.   Musculoskeletal:  Negative for back pain.  Neurological:  Negative for headaches.  Psychiatric/Behavioral:  Negative for depression.   All other systems reviewed and are negative.      Objective:     BP 102/60   Pulse 87   Temp 98.4 F (36.9 C) (Oral)   Ht 5' 1.75 (1.568 m)   Wt 190 lb 6.4 oz (86.4 kg)   SpO2 97%   BMI 35.11 kg/m    Physical Exam Vitals reviewed.  Constitutional:      Appearance: Normal appearance. She is well-groomed. She is obese.  HENT:     Right Ear: Tympanic membrane and ear canal normal.     Left Ear: Tympanic membrane and ear canal normal.     Mouth/Throat:     Pharynx: No posterior oropharyngeal erythema.  Neck:     Thyroid : No thyromegaly.   Cardiovascular:     Rate and Rhythm: Normal rate and regular rhythm.     Pulses: Normal pulses.     Heart sounds: S1 normal and S2 normal.  Pulmonary:     Effort: Pulmonary effort is normal.     Breath sounds: Normal breath sounds and air entry.  Abdominal:     General: Abdomen is flat. Bowel sounds are normal.     Palpations: Abdomen is soft.   Musculoskeletal:     Right lower leg: No edema.     Left lower leg: No edema.   Lymphadenopathy:     Cervical: No cervical adenopathy.   Neurological:     Mental Status: She is alert and oriented to person, place, and time. Mental status is at baseline.     Gait: Gait is intact.   Psychiatric:        Mood and Affect: Mood and affect normal.        Speech: Speech normal.        Behavior: Behavior normal.  Judgment: Judgment normal.      No results found for any visits on 12/02/23.     Assessment & Plan:    Routine Health Maintenance and Physical Exam  Immunization History  Administered Date(s) Administered   Fluad Quad(high Dose 65+) 02/28/2021   Influenza Split 04/26/2011   Influenza Whole 03/18/2009, 03/18/2010, 03/18/2016   Influenza,inj,Quad PF,6+ Mos 04/19/2014, 02/28/2017, 03/06/2018, 02/13/2019, 03/04/2020   Influenza-Unspecified 02/22/2022, 02/28/2023   PFIZER(Purple Top)SARS-COV-2 Vaccination 09/04/2019, 09/29/2019, 04/06/2020, 04/09/2021, 02/27/2023   PNEUMOCOCCAL CONJUGATE-20 05/10/2021   Pfizer Covid Bivalent Pediatric Vaccine(61mos to <32yrs) 03/17/2022   Pneumococcal Conjugate-13 03/10/2015   Pneumococcal Polysaccharide-23 06/18/2005   Respiratory Syncytial Virus Vaccine,Recomb Aduvanted(Arexvy) 03/08/2022   Tdap 12/04/2016   Zoster Recombinant(Shingrix) 01/01/2018, 06/22/2018    Health Maintenance  Topic Date Due   COVID-19 Vaccine (7 - 2024-25 season) 04/24/2023   MAMMOGRAM  09/14/2023   DEXA SCAN  09/14/2023   INFLUENZA VACCINE  01/17/2024   Medicare Annual Wellness (AWV)  05/29/2024   Colonoscopy  09/12/2026   DTaP/Tdap/Td (2 - Td or Tdap) 12/05/2026   Pneumococcal Vaccine: 50+ Years  Completed   Hepatitis C Screening  Completed   Zoster Vaccines- Shingrix  Completed   HPV VACCINES  Aged Out   Meningococcal B Vaccine  Aged Out    Discussed health benefits of physical activity, and encouraged her to engage in regular exercise appropriate for her age and condition.  Routine general medical examination at a health care  facility  Gastroesophageal reflux disease, unspecified whether esophagitis present -     Pantoprazole  Sodium; Take 1 tablet (40 mg total) by mouth daily.  Dispense: 90 tablet; Refill: 3   Normal physical exam findings. I counseled the patient on the recommended amount of exercise per CDC recommendation. I reviewed preventative screening, immunizations, and medical history and updated in the chart, and appropriate labs and vaccinations were ordered. Handouts given on healthy eating and exercise.   Return in 1 year (on 12/01/2024).     Heron [name] Sharper, MD

## 2023-12-02 NOTE — Patient Instructions (Signed)

## 2023-12-04 ENCOUNTER — Encounter: Payer: Self-pay | Admitting: Internal Medicine

## 2023-12-04 ENCOUNTER — Ambulatory Visit: Admitting: Internal Medicine

## 2023-12-04 DIAGNOSIS — R1084 Generalized abdominal pain: Secondary | ICD-10-CM

## 2023-12-04 DIAGNOSIS — D132 Benign neoplasm of duodenum: Secondary | ICD-10-CM

## 2023-12-04 DIAGNOSIS — R197 Diarrhea, unspecified: Secondary | ICD-10-CM

## 2023-12-04 DIAGNOSIS — Z860101 Personal history of adenomatous and serrated colon polyps: Secondary | ICD-10-CM | POA: Diagnosis not present

## 2023-12-04 DIAGNOSIS — K219 Gastro-esophageal reflux disease without esophagitis: Secondary | ICD-10-CM | POA: Diagnosis not present

## 2023-12-04 MED ORDER — DICYCLOMINE HCL 10 MG PO CAPS: 10.0000 mg | ORAL_CAPSULE | ORAL | 2 refills | Status: AC | PRN

## 2023-12-04 MED ORDER — PANTOPRAZOLE SODIUM 40 MG PO TBEC: 40.0000 mg | DELAYED_RELEASE_TABLET | Freq: Two times a day (BID) | ORAL | 3 refills | Status: AC

## 2023-12-04 NOTE — Patient Instructions (Addendum)
 We have sent the following medications to your pharmacy for you to pick up at your convenience: Pantoprazole  take 1 capsule 2 times daily 30 minutes to 1 hour before morning coffee and dinner.   Dicyclomine  take as needed for abdominal pain.  Start daily fiber supplement with Sunfiber   Follow up in 3 months  _______________________________________________________  If your blood pressure at your visit was 140/90 or greater, please contact your primary care physician to follow up on this.  _______________________________________________________  If you are age 17 or older, your body mass index should be between 23-30. Your Body mass index is 35.4 kg/m. If this is out of the aforementioned range listed, please consider follow up with your Primary Care Provider.  If you are age 59 or younger, your body mass index should be between 19-25. Your Body mass index is 35.4 kg/m. If this is out of the aformentioned range listed, please consider follow up with your Primary Care Provider.   ________________________________________________________  The [facility] providers would like to encourage you to use MYCHART to communicate with providers for non-urgent requests or questions.  Due to long hold times on the telephone, sending your provider a message by [facility] may be a faster and more efficient way to get a response.  Please allow 48 business hours for a response.  Please remember that this is for non-urgent requests.  _______________________________________________________   Thank you for entrusting me with your care and for choosing [facility], Dr. Regino Caprio

## 2023-12-04 NOTE — Progress Notes (Signed)
 Chief Complaint: GERD  HPI: Patient is a 68 year old female patient with past medical history of GERD, asthma, SIBO, and hyperlipidemia who presents for follow up of GERD  GI history: On 02/12/22 seen by Dr Wynelle Heather at [facility]. Patient given trial of rifaximin  for IBS-D and/or SIBO. (Per patient xifaxan  made her very ill) On 03/26/22 seen by Dr. Wynelle Heather. Treated for IMO with Doxycycline  100 mg and Neomycin  500 mg x 10 days. (Worked well for her- improved diarrhea, abdominal pain)  Interval History Her dysphagia has improved after dilation. Endorses regurgitation. Her upper stomach feels like it is burning. She is taking pantoprazole  40 mg daily, usually late morning before eating. She continues on famotidine  at bedtime. She does have some lower abdominal pain that comes and goes. She is using Bentyl  10 mg TID. Most mornings she has one BM that is normal, diarrhea, or barely anything. She avoids sucralose and lactose because it does not agree with her. She follows a keto-friendly and more protein oriented diet. Currently her burning sensation and regurgitation are the most bothersome. Her bowel habits and bloating and not bad right now.   Wt Readings from Last 3 Encounters:  12/04/23 192 lb (87.1 kg)  12/02/23 190 lb 6.4 oz (86.4 kg)  09/12/23 189 lb 3.2 oz (85.8 kg)    Past Medical History:  Diagnosis Date   Abnormal EKG    Allergy    Anxiety    Arthritis    Asthma    Bipolar depression (HCC), Anxiety, PTSD, Panic disorder    -managed by [facility]   CAD (coronary artery disease)    Depression    Foot pain    Gallstones    GERD (gastroesophageal reflux disease)    Heart murmur    as a child   HLD (hyperlipidemia)    Insomnia    Leg swelling    Obesity    Osteoporosis    osteopenia   Pneumonia due to COVID-19 virus 04/22/2019   Tachycardia    Past Surgical History:  Procedure Laterality Date   BREAST BIOPSY Right    biopsy of nipple   CARPAL TUNNEL RELEASE Left     CESAREAN SECTION     x 2   CHOLECYSTECTOMY     COLONOSCOPY     ENDOMETRIAL ABLATION     EXCISION MORTON'S NEUROMA Left 01/11/2022   Procedure: SECOND WEBSPACE MORTON'S NEUROMA EXCISION;  Surgeon: Amada Backer, MD;  Location: [facility];  Service: Orthopedics;  Laterality: Left;   FOOT SURGERY Left    HAMMER TOE SURGERY Left 01/11/2022   Procedure: COLLATERAL LIGAMENT REPAIRS WITH SECOND AND THIRD HAMMER TOE CORRECTION;  Surgeon: Amada Backer, MD;  Location: [facility];  Service: Orthopedics;  Laterality: Left;   KNEE SURGERY Right    reconstruction for patellar dislocation   LAPAROSCOPY     x 2   TOENAIL EXCISION Left 01/11/2022   Procedure: HALLUX TOENAIL PERMANENT EXCISION;  Surgeon: Amada Backer, MD;  Location: [facility];  Service: Orthopedics;  Laterality: Left;   TUBAL LIGATION     ULNAR NERVE REPAIR Left    UPPER GASTROINTESTINAL ENDOSCOPY     WEIL OSTEOTOMY Left 01/11/2022   Procedure: SECOND, THIRD AND FOURTH WEIL OSTEOTOMIES;  Surgeon: Amada Backer, MD;  Location: [facility];  Service: Orthopedics;  Laterality: Left;   Current Outpatient Medications  Medication Sig Dispense Refill   albuterol  (PROVENTIL ) (2.5 MG/3ML) 0.083% nebulizer solution Take 3 mLs (2.5 mg total)  by nebulization every 6 (six) hours as needed for wheezing or shortness of breath. 75 mL 12   Albuterol  Sulfate (PROAIR  RESPICLICK) 108 (90 Base) MCG/ACT AEPB Inhale 2 puffs into the lungs every 6 (six) hours as needed. 1 each 5   Ascorbic Acid  (VITA-C PO) Take 1 tablet by mouth daily.     Azelastine  HCl 137 MCG/SPRAY SOLN SPRAY 1 SPRAY IN EACH NOSTRIL TWICE DAILY 30 mL 5   buPROPion  (WELLBUTRIN  XL) 150 MG 24 hr tablet Take 2 tablets (300 mg total) by mouth daily. 60 tablet 1   busPIRone  (BUSPAR ) 15 MG tablet Take 1 tablet (15 mg total) by mouth 3 (three) times daily. 270 tablet 1   cetirizine  (ZYRTEC ) 10 MG tablet Take 1 tablet (10 mg total) by mouth  daily. 90 tablet 1   Cholecalciferol (VITAMIN D -3 PO) Take 10,000 Units by mouth daily.      clonazePAM  (KLONOPIN ) 0.5 MG tablet TAKE 1 TABLET BY MOUTH 2 TIMES A DAY AND TAKE TWO TABLETS BY MOUTH AT BEDTIME 360 tablet 0   dicyclomine  (BENTYL ) 10 MG capsule TAKE 1 CAPSULE BY MOUTH 3 TIMES A DAY AS NEEDED FOR SPASMS 90 capsule 2   doxazosin  (CARDURA ) 4 MG tablet Take 2 tablets (8 mg total) by mouth at bedtime. 180 tablet 0   Evolocumab  (REPATHA  SURECLICK) 140 MG/ML SOAJ Inject 140 mg into the skin every 14 (fourteen) days. 6 mL 3   ezetimibe  (ZETIA ) 10 MG tablet TAKE 1 TABLET BY MOUTH DAILY 90 tablet 1   famotidine  (PEPCID ) 20 MG tablet Take 1 tablet (20 mg total) by mouth at bedtime. 90 tablet 3   fluticasone  (FLONASE ) 50 MCG/ACT nasal spray USE 2 SPRAYS INTO BOTH NOSTRILS TWICE DAILY 96 mL 2   fluticasone -salmeterol (ADVAIR) 250-50 MCG/ACT AEPB Inhale 1 puff into the lungs every 12 (twelve) hours. 180 each 1   Menaquinone-7 (VITAMIN K2) 100 MCG CAPS Take 1 capsule by mouth daily.     mirtazapine  (REMERON ) 45 MG tablet Take 1 tablet (45 mg total) by mouth at bedtime. 100 tablet 1   montelukast  (SINGULAIR ) 10 MG tablet TAKE 1 TABLET BY MOUTH AT BEDTIME 100 tablet 0   Multiple Vitamin (MULTIVITAMIN WITH MINERALS) TABS tablet Take 1 tablet by mouth daily.     Omega-3 Fatty Acids (OMEGA 3 PO) Take 1 capsule by mouth daily.     pantoprazole  (PROTONIX ) 40 MG tablet Take 1 tablet (40 mg total) by mouth daily. 90 tablet 3   sodium chloride  (OCEAN) 0.65 % SOLN nasal spray Place 1 spray into both nostrils as needed for congestion.     No current facility-administered medications for this visit.   Allergies as of 12/04/2023 - Review Complete 12/04/2023  Allergen Reaction Noted   Aspirin Other (See Comments)    Atorvastatin  Other (See Comments) 01/04/2022   Codeine Itching 09/26/2017   Crestor  [rosuvastatin ] Other (See Comments) 08/16/2021   Gabapentin  Other (See Comments) 09/04/2021   Niacin and  related Itching 01/11/2021   Sulfa antibiotics Other (See Comments) 03/02/2021   Sulfamethoxazole-trimethoprim Other (See Comments)    Topiramate  Other (See Comments) 10/25/2020   Trileptal  [oxcarbazepine ] Itching 11/28/2018   Trimethoprim Other (See Comments) 11/12/2014   Ultram [tramadol] Itching 09/26/2017   Vioxx [rofecoxib] Other (See Comments)    Family History  Problem Relation Age of Onset   Hypertension Mother    Hyperlipidemia Mother    Congestive Heart Failure Mother    Other Father        killed  COPD Brother    Heart disease Brother    Pulmonary embolism Brother    Colon polyps Maternal Aunt    COPD Paternal Aunt        a lot of aunts and uncle COPD or Emphysema   Emphysema Paternal Uncle    Breast cancer Daughter    Colon cancer Neg Hx    Esophageal cancer Neg Hx    Pancreatic cancer Neg Hx    Stomach cancer Neg Hx    Liver disease Neg Hx    Rectal cancer Neg Hx     Physical Exam:  Vital signs: BP 114/72   Pulse 83   Ht 5' 1.75 (1.568 m)   Wt 192 lb (87.1 kg)   SpO2 96%   BMI 35.40 kg/m   Constitutional: Pleasant Caucasian female appears to be in NAD, Well developed, Well nourished, alert and cooperative Neck:  Supple Throat: Oral cavity and pharynx without inflammation, swelling or lesion.  Respiratory: Respirations even and unlabored. Lungs clear to auscultation bilaterally.   No wheezes, crackles, or rhonchi.  Cardiovascular: Normal S1, S2. Regular rate and rhythm. No peripheral edema, cyanosis or pallor.  Gastrointestinal:  Soft, nondistended, non-tender. Rectal:  Not performed.  Skin:   Dry and intact without significant lesions or rashes. Psychiatric: Oriented to person, place and time. Demonstrates good judgement and reason without abnormal affect or behaviors.  RELEVANT LABS AND IMAGING: CBC    Latest Ref Rng & Units 04/26/2023   11:25 AM 05/10/2021    9:34 AM 04/06/2020    9:16 AM  CBC  WBC 4.0 - 10.5 K/uL 6.8  4.4  4.7   Hemoglobin  12.0 - 15.0 g/dL 09.8  11.9  14.7   Hematocrit 36.0 - 46.0 % 45.8  45.9  45.7   Platelets 150.0 - 400.0 K/uL 338.0  318.0  273      CMP     Latest Ref Rng & Units 05/22/2023   10:44 AM 04/26/2023   11:25 AM 11/17/2021    8:35 AM  CMP  Glucose 70 - 99 mg/dL  92  94   BUN 6 - 23 mg/dL  19  16   Creatinine 8.29 - 1.20 mg/dL  5.62  1.30   Sodium 865 - 145 mEq/L  139  141   Potassium 3.5 - 5.1 mEq/L  4.1  4.4   Chloride 96 - 112 mEq/L  105  102   CO2 19 - 32 mEq/L  28  25   Calcium  8.4 - 10.5 mg/dL  9.2  9.7   Total Protein 6.0 - 8.5 g/dL 6.3  6.9  6.6   Total Bilirubin 0.0 - 1.2 mg/dL 0.3  0.3  0.4   Alkaline Phos 44 - 121 IU/L 61  58  68   AST 0 - 40 IU/L 13  16  16    ALT 0 - 32 IU/L 16  14  23      Lab Results  Component Value Date   TSH 1.70 04/23/2022   01/26/2020 echo- Left ventricular ejection fraction, by estimation, is 60 to 65%  01/25/21 EGD Impression:  - Z- line regular, 35 cm from the incisors.  - A few gastric polyps consistent with fundic gland polyps.  - Known duodenal bulb adenoma. Residual was removed today.  - The examination was otherwise normal. Path:Diagnosis Surgical [P], duodenal polyp - POLYPOID DUODENAL MUCOSA WITH PEPTIC INJURY. - NO ATYPIA, ADENOMATOUS CHANGE OR CARCINOMA. Per current guidelines, you should have a repeat endoscopy  in 6-12 months to reassess for any residual polyp. Hoping the results were again negative, we would then repeat the EGD 1 year later before extending the interval between endoscopies.   08/08/20 EGD Impression:  - Normal esophagus.  - Erythematous mucosa in the gastric body. Biopsied.  - A single duodenal polyp. Resected and retrieved. - Normal examined duodenum. Biopsied. Path:Diagnosis 1. Surgical [P], duodenal polyp, polyp (1) - DUODENAL ADENOMA. - NO HIGH GRADE DYSPLASIA OR MALIGNANCY. 2. Surgical [P], duodenal bxs - BENIGN SMALL BOWEL MUCOSA. - NO VILLOUS BLUNTING OR INCREASE IN INTRAEPITHELIAL LYMPHOCYTES. - NO  DYSPLASIA OR MALIGNANCY. 3. Surgical [P], gastric - BENIGN GASTRIC MUCOSA. Jhon Moselle IS NEGATIVE FOR HELICOBACTER PYLORI. - NO INTESTINAL METAPLASIA, DYSPLASIA, OR MALIGNANCY.  04/09/2018 colonoscopy, recall 10/24 (increased association of colon cancer in the setting of duodenal adenomas. Given that risk, I recommend that she had a colonoscopy 5 years after her last.) Impression:  - Diverticulosis in the entire examined colon.  - The examination was otherwise normal on direct and retroflexion views.  - No specimens collected.  04/09/2018 EGD Impression:  - Normal esophagus.  - Normal stomach. Biopsied.  - Normal examined duodenum. Biopsied. Path: Diagnosis 1. Surgical [P], random duodenal bx - BENIGN SMALL BOWEL MUCOSA. - NO ACTIVE INFLAMMATION OR VILLOUS ATROPHY IDENTIFIED. 2. Surgical [P], random gastric - CHRONIC INACTIVE GASTRITIS, MILD. - THERE IS NO EVIDENCE OF HELICOBACTER PYLORI, DYSPLASIA OR MALIGNANCY. - SEE COMMENT. 3. Surgical [P], colon, random sites - BENIGN COLONIC MUCOSA. - NO SIGNIFICANT INFLAMMATION OR OTHER ABNORMALITIES IDENTIFIED. Microscopic Comment- A Warthin-Starry stain is performed to determine the possibility of the presence of Helicobacter pylori. The Warthin-Starry stain is negative for organisms of Helicobacter pylori.  EGD 09/12/23: - Esophageal narrowing at cricopharyngeus. Dilated. Mild mucosal disruption seen. - Gastritis. Biopsied. - A single duodenal polyp. Resected and retrieved. - Biopsies were taken with a cold forceps for histology in the proximal esophagus and in the distal esophagus. Path: 1. Surgical [P], duodenal polyp, polyp (1) :       BENIGN DUODENAL MUCOSA WITH NO DIAGNOSTIC ABNORMALITY       2. Surgical [P], gastric antrum :       REACTIVE GASTROPATHY       NEGATIVE FOR H. PYLORI, INTESTINAL METAPLASIA, DYSPLASIA CARCINOMA       3. Surgical [P], esophagus :       REACTIVE SQUAMOUS MUCOSA       NEGATIVE FOR GLANDULAR  EPITHELIUM, EOSINOPHILS, DYSPLASIA AND CARCINOMA   Colonoscopy 09/12/23: - The terminal ileum appeared normal. - Multiple diverticula were found in the sigmoid colon, descending colon, transverse colon, ascending colon and cecum. - Three sessile polyps were found in the ascending colon and cecum. The polyps were 3 to 6 mm in size. These polyps were removed with a cold snare. Resection and retrieval were complete. - Four sessile polyps were found in the descending colon. The polyps were 3 to 10 mm in size. These polyps were removed with a cold snare. Resection and retrieval were complete. - Biopsies for histology were taken with a cold forceps from the entire colon for evaluation of microscopic colitis. - Non- bleeding internal hemorrhoids were found during retroflexion. Path: 4. Surgical [P], colon, cecum and ascending, polyp (3) :      TUBULAR ADENOMA, 3 FRAGMENTS      NEGATIVE FOR HIGH-GRADE DYSPLASIA CARCINOMA      5. Surgical [P], colon nos, random sites :      BENIGN COLONIC MUCOSA WITH  NO DIAGNOSTIC ABNORMALITY      6. Surgical [P], colon, descending, polyp (4) :      TUBULAR ADENOMA, 8 FRAGMENTS      NEGATIVE FOR HIGH-GRADE DYSPLASIA AND CARCINOMA   Assessment: GERD Epigastric burning Dysphagia - resolved History of colon polyps Duodenal adenoma Patient's dysphagia has resolved after her esophageal dilation, but she has continued to have some GERD and epigastric burning. Will increase her pantoprazole  and try to optimize how she takes her PPI. For her bowel habits, I asked her to try Sunfiber and to use dicyclomine  PRN.  Her bloating and stools are not bad right now so no need for repeat course of treatment for SIBO/IMO at this time.  Plan: -Previously educated on GERD friendly diet -Increase Pantoprazole  40 mg po from every day to BID. Take 30 min to 1 hr before morning coffee and before dinner. -Cont famotidine  20 mg QHS -Start daily fiber supplement with Sunfiber -Dicyclomine  10 mg  prn for abdominal pain -Repeat EGD in 08/2024 for follow up of a duodenal adenoma. If this is normal, then next EGD can be in 5 years. -Next colonoscopy in 3 years due in 08/2026 for polyp surveillance -RTC 3 months  Regino Caprio, MD [facility] 12/04/2023, 9:11 AM  I spent 38 minutes of time, including in depth chart review, independent review of results as outlined above, communicating results with the patient directly, face-to-face time with the patient, coordinating care, ordering studies and medications as appropriate, and documentation.

## 2023-12-06 ENCOUNTER — Ambulatory Visit: Admitting: Psychiatry

## 2023-12-06 DIAGNOSIS — F3162 Bipolar disorder, current episode mixed, moderate: Secondary | ICD-10-CM | POA: Diagnosis not present

## 2023-12-06 NOTE — Progress Notes (Signed)
 [facility]or[facility]rap[facility] Progress Note  Patient ID: Brittany [name], MRN: [id number],    Date: 12/06/2023  Time Spent: 53 minutes   Treatment Type: Individual Therapy  Reported Symptoms:  anxiety, depression and noting some improvement, overthinking, motivation improved some, some loneliness but improved, sensitivity to perceived rejection, some trust issues, sensitivity to rejection due to my PTSD which I've lived with for 42-45.  Relationship concerns and some difficulty :To have knee surgery July 9th coming up.   Mental Status Exam:  Appearance:   Casual     Behavior:  Sharing, Motivated, and comparing self to others especially within the family  Motor:  Affected by her knee  Speech/Language:   Clear and Coherent  Affect:  Depressed and anxious  Mood:  anxious and depressed  Thought process:  goal directed  Thought content:    WNL  Sensory/Perceptual disturbances:    WNL  Orientation:  oriented to person, place, time/date, situation, day of week, month of year, year, and stated date of December 06, 2023  Attention:  Fair  Concentration:  Fair  Memory:  WNL  Fund of knowledge:   Good and Fair  Insight:    Fair  Judgment:   Good and Fair  Impulse Control:  Good and Fair   Risk Assessment: Danger to Self:  No Self-injurious Behavior: No Danger to Others: No Duty to Warn:no Physical Aggression / Violence:No  Access to Firearms a concern: No  Gang Involvement:No   Subjective:     Patient in for session today and continues to work on healthier boundaries, my bipolar, improving her motivation, and focusing on improving her self-esteem.  As noted last session, she is working harder to try more with strategies including strategies that she really has not followed through on and tried before rather than assuming something probably will not work for me. Difficulty letting go of negatives in past and not hold grudges, which ends up keeping her in turmoil and  second-guessing others and their intentions.  Important work today on not always assuming the negatives, trying to refrain from second-guessing people, and intentionally trying to look for more positives.  These are issues that patient has struggled with for quite a while however just recently is being more willing to actually look at some of these thoughts and behaviors and how they work against her versus protect her.  Good work in session today and has some homework around the same issues to work on in between sessions.  Trying to decrease negative assumptions and not jump to conclusions, and decrease her overthinking.  Also working to not overstress and we discussed strategies in session today that can help her with this specific behavior.  Encouraged her to be patient with herself as she has practiced some of the behaviors that we are working on changing, for a long time and her being able to change will probably not happen suddenly but the more she works on issues the more likely she can be successful in making some needed changes.   Interventions: Cognitive Behavioral Therapy, Solution-Oriented/Positive Psychology, and Ego-Supportive 1) Long Term Goal: Develop and follow through on strategies to reduce symptoms.     Short Term Goal: Reduce anxiety and improve coping skills.     Objective: Learn two new ways of coping with daily stressors.     Objective: Develop strategies for thought distraction when fixating on the future.     Objective: Manage panic episodes. 2) Long Term Goal: Maintain stability  of mood     Short Term Goal: Increase ability to manage moods.     Objective: Discuss and resolve troubling personal and interpersonal issues.    Diagnosis:   ICD-10-CM   1. Bipolar 1 disorder, mixed, moderate (HCC)  F31.62      Plan:   Patient today in session trying to be more goal focused and working further on her self-esteem, my bipolar, healthier boundaries, relationship and improving  her motivation.  Good participation today and accepted some homework for her to work on between sessions especially regarding her self-esteem, how she tends to automatically assume she is viewed negatively, being able to let go of negative assumptions, and envision more of how she would like to be and feel around other people.  Patient is sensing a little bit of progress and trying not to be as negative or make assumptions about other people and what they might be thinking of her and also is encouraged to let herself feel good.  To continue working on her own self-esteem, interpersonal skills, expectations and relationships and difficulty trusting although there has been some improvement in that area.  Continues to work on Market researcher.  Wanting to be less negative in her thought patterns.  Practicing limit setting with people who are not healthy for her.  Recognizing her overthinking more quickly and works to interrupt it.  Still struggling with frequent thoughts of how things have to be in order to be okay.  More open today in session particularly about some needed changes she seems willing to work on, which was a positive.  Encouraged her practice of the pause.  Also to let her faith be a resource of emotional support and stability as well as spiritual.  Working to better understand that her anxious thoughts are just thoughts and are not necessarily going to play out in reality the way she might assume or fear.  Encouraging her to recognize her own strengths.  Torrence Branagan has made some progress and needs to continue working with her goal-directed behaviors to move in a more hopeful and healthier direction into the future.  Goal review and progress/challenges noted with patient.  Next appointment within approximately 2 weeks, as she is able.  (Patient is having knee surgery in July.   Kelleen Patee, LCSW

## 2023-12-10 ENCOUNTER — Ambulatory Visit: Payer: Self-pay | Admitting: Orthopedic Surgery

## 2023-12-10 NOTE — H&P (View-Only) (Signed)
 Brittany [name] is an 68 y.o. female.   Chief Complaint: R knee pain HPI: [name] is here today for a history and physical. She is scheduled for a right total knee arthroplasty at [facility] on 12/25/2023.  Dr. Duwayne and the patient mutually agreed to proceed with a total knee replacement. Risks and benefits of the procedure were discussed including stiffness, suboptimal range of motion, persistent pain, infection requiring removal of prosthesis and reinsertion, need for prophylactic antibiotics in the future, for example, dental procedures, possible need for manipulation, revision in the future and also anesthetic complications including DVT, PE, etc. We discussed the perioperative course, time in the hospital, postoperative recovery and the need for elevation to control swelling. We also discussed the predicted range of motion and the probability that squatting and kneeling would be unobtainable in the future. In addition, postoperative anticoagulation was discussed. We have obtained preoperative medical clearance as necessary. Provided illustrated handout and discussed it in detail. They will enroll in the total joint replacement educational forum at the hospital.  She has had nosebleeds on Eliquis  previously, back in November 2020. We discussed putting her on the lower dose Eliquis  postop as she is unable to take aspirin. She does report GI issues with antibiotics. In the past has been constipated with narcotics, Oxy more so than hydrocodone. She is unable to take tramadol. She lives alone, does have a daughter who lives locally however will not be able to help her much given her own health issues.  Past Medical History:  Diagnosis Date   Abnormal EKG    Allergy    Anxiety    Arthritis    Asthma    Bipolar depression (HCC), Anxiety, PTSD, Panic disorder    -managed by [facility]   CAD (coronary artery disease)    Depression    Foot pain    Gallstones    GERD (gastroesophageal  reflux disease)    Heart murmur    as a child   HLD (hyperlipidemia)    Insomnia    Leg swelling    Obesity    Osteoporosis    osteopenia   Pneumonia due to COVID-19 virus 04/22/2019   Tachycardia     Past Surgical History:  Procedure Laterality Date   BREAST BIOPSY Right    biopsy of nipple   CARPAL TUNNEL RELEASE Left    CESAREAN SECTION     x 2   CHOLECYSTECTOMY     COLONOSCOPY     ENDOMETRIAL ABLATION     EXCISION MORTON'S NEUROMA Left 01/11/2022   Procedure: SECOND WEBSPACE MORTON'S NEUROMA EXCISION;  Surgeon: Kit Rush, MD;  Location: [facility];  Service: Orthopedics;  Laterality: Left;   FOOT SURGERY Left    HAMMER TOE SURGERY Left 01/11/2022   Procedure: COLLATERAL LIGAMENT REPAIRS WITH SECOND AND THIRD HAMMER TOE CORRECTION;  Surgeon: Kit Rush, MD;  Location: [facility];  Service: Orthopedics;  Laterality: Left;   KNEE SURGERY Right    reconstruction for patellar dislocation   LAPAROSCOPY     x 2   TOENAIL EXCISION Left 01/11/2022   Procedure: HALLUX TOENAIL PERMANENT EXCISION;  Surgeon: Kit Rush, MD;  Location:  SURGERY CENTER;  Service: Orthopedics;  Laterality: Left;   TUBAL LIGATION     ULNAR NERVE REPAIR Left    UPPER GASTROINTESTINAL ENDOSCOPY     WEIL OSTEOTOMY Left 01/11/2022   Procedure: SECOND, THIRD AND FOURTH WEIL OSTEOTOMIES;  Surgeon: Kit Rush, MD;  Location:  [facility];  Service: Orthopedics;  Laterality: Left;    Family History  Problem Relation Age of Onset   Hypertension Mother    Hyperlipidemia Mother    Congestive Heart Failure Mother    Other Father        killed   COPD Brother    Heart disease Brother    Pulmonary embolism Brother    Colon polyps Maternal Aunt    COPD Paternal Aunt        a lot of aunts and uncle COPD or Emphysema   Emphysema Paternal Uncle    Breast cancer Daughter    Colon cancer Neg Hx    Esophageal cancer Neg Hx    Pancreatic cancer Neg  Hx    Stomach cancer Neg Hx    Liver disease Neg Hx    Rectal cancer Neg Hx    Social History:  reports that she quit smoking about 24 years ago. Her smoking use included cigarettes. She started smoking about 25 years ago. She has a 20 pack-year smoking history. She has never used smokeless tobacco. She reports current alcohol use. She reports that she does not use drugs.  Allergies:  Allergies  Allergen Reactions   Aspirin Other (See Comments)    discomfort   Atorvastatin  Other (See Comments)    Patient states she felt suicidal   Codeine Itching   Crestor  [Rosuvastatin ] Other (See Comments)    Dizzy, leg pain and states she felt suicidal   Gabapentin  Other (See Comments)   Niacin And Related Itching   Sulfa Antibiotics Other (See Comments)    Other reaction(s): Unknown   Sulfamethoxazole-Trimethoprim Other (See Comments)    Unknown reaction per pt   Topiramate  Other (See Comments)    Per patient, it causes stomach burning   Trileptal  [Oxcarbazepine ] Itching    itching   Trimethoprim Other (See Comments)    Unknown, per pt   Ultram [Tramadol] Itching   Vioxx [Rofecoxib] Other (See Comments)    Unknown per pt   Current meds: albuterol  sulfate 2.5 mg/3 mL (0.083 %) solution for nebulization azelastine  137 mcg (0.1 %) nasal spray busPIRone  15 mg tablet cetirizine  10 mg tablet chlorhexidine gluconate 4 % topical liquid clonazePAM  dicyclomine  20 mg tablet doxazosin  4 mg tablet ezetimibe  10 mg tablet fluticasone  250 mcg-salmeteroL 50 mcg/dose blistr powdr for inhalation mirtazapine  45 mg tablet montelukast  10 mg tablet pantoprazole  40 mg tablet,delayed release ProAir  RespiClick 90 mcg/actuation breath activated Repatha  SureClick Symbicort   Review of Systems  Constitutional: Negative.   HENT: Negative.    Eyes: Negative.   Respiratory: Negative.    Cardiovascular: Negative.   Gastrointestinal: Negative.   Endocrine: Negative.   Genitourinary: Negative.    Musculoskeletal:  Positive for arthralgias, gait problem, joint swelling and myalgias.  Skin: Negative.   Psychiatric/Behavioral: Negative.      There were no vitals taken for this visit. Physical Exam Constitutional:      Appearance: Normal appearance.  HENT:     Head: Normocephalic and atraumatic.     Right Ear: External ear normal.     Left Ear: External ear normal.     Nose: Nose normal.     Mouth/Throat:     Pharynx: Oropharynx is clear.   Eyes:     Conjunctiva/sclera: Conjunctivae normal.    Cardiovascular:     Rate and Rhythm: Normal rate and regular rhythm.     Pulses: Normal pulses.  Pulmonary:     Effort: Pulmonary effort is normal.  Abdominal:     General: Abdomen is flat.   Musculoskeletal:     Cervical back: Normal range of motion.     Comments: Ranges -5-110 tender medial joint line patellofemoral pain compression trace effusion no DVT   Skin:    General: Skin is warm and dry.   Neurological:     Mental Status: She is alert.     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 Duwayne on July 9. We again discussed the procedure itself as well as risks, complications and alternatives, including but not limited to DVT, PE, infx, bleeding, failure of procedure, need for secondary procedure including manipulation, nerve injury, ongoing pain/symptoms, anesthesia risk, even stroke or death. Also discussed typical post-op protocols, activity restrictions, need for PT, flexion/extension exercises, time out of work. Discussed need for DVT ppx post-op per protocol. Discussed dental ppx and infx prevention. Also discussed limitations post-operatively such as kneeling and squatting. All questions were answered. Patient desires to proceed with surgery as scheduled.  Will hold  supplements and NSAIDs accordingly. Will remain NPO after midnight the night before surgery. Will present to [facility] for pre-op testing. Anticipate hospital stay to include at least 2 midnights given medical history and to ensure proper pain control. Plan low-dose Eliquis  for DVT ppx post-op. Plan hydrocodone, Robaxin, Colace, Miralax. Plan home with HHPT post-op with family members at home for assistance. Will follow up 10-14 days post-op for suture removal and xrays.  Plan Right total knee replacement  Daisean Brodhead M Charle Mclaurin, PA-C for Dr Duwayne 12/10/2023, 10:50 AM

## 2023-12-10 NOTE — H&P (Signed)
 Brittany [name] is an 68 y.o. female.   Chief Complaint: R knee pain HPI: Brittany [name] is here today for a history and physical. She is scheduled for a right total knee arthroplasty at [facility] on 12/25/2023.  Dr. Duwayne and the patient mutually agreed to proceed with a total knee replacement. Risks and benefits of the procedure were discussed including stiffness, suboptimal range of motion, persistent pain, infection requiring removal of prosthesis and reinsertion, need for prophylactic antibiotics in the future, for example, dental procedures, possible need for manipulation, revision in the future and also anesthetic complications including DVT, PE, etc. We discussed the perioperative course, time in the hospital, postoperative recovery and the need for elevation to control swelling. We also discussed the predicted range of motion and the probability that squatting and kneeling would be unobtainable in the future. In addition, postoperative anticoagulation was discussed. We have obtained preoperative medical clearance as necessary. Provided illustrated handout and discussed it in detail. They will enroll in the total joint replacement educational forum at the hospital.  She has had nosebleeds on Eliquis  previously, back in November 2020. We discussed putting her on the lower dose Eliquis  postop as she is unable to take aspirin. She does report GI issues with antibiotics. In the past has been constipated with narcotics, Oxy more so than hydrocodone. She is unable to take tramadol. She lives alone, does have a daughter who lives locally however will not be able to help her much given her own health issues.  Past Medical History:  Diagnosis Date   Abnormal EKG    Allergy    Anxiety    Arthritis    Asthma    Bipolar depression (HCC), Anxiety, PTSD, Panic disorder    -managed by [facility]   CAD (coronary artery disease)    Depression    Foot pain    Gallstones    GERD (gastroesophageal  reflux disease)    Heart murmur    as a child   HLD (hyperlipidemia)    Insomnia    Leg swelling    Obesity    Osteoporosis    osteopenia   Pneumonia due to COVID-19 virus 04/22/2019   Tachycardia     Past Surgical History:  Procedure Laterality Date   BREAST BIOPSY Right    biopsy of nipple   CARPAL TUNNEL RELEASE Left    CESAREAN SECTION     x 2   CHOLECYSTECTOMY     COLONOSCOPY     ENDOMETRIAL ABLATION     EXCISION MORTON'S NEUROMA Left 01/11/2022   Procedure: SECOND WEBSPACE MORTON'S NEUROMA EXCISION;  Surgeon: Kit Rush, MD;  Location: [facility];  Service: Orthopedics;  Laterality: Left;   FOOT SURGERY Left    HAMMER TOE SURGERY Left 01/11/2022   Procedure: COLLATERAL LIGAMENT REPAIRS WITH SECOND AND THIRD HAMMER TOE CORRECTION;  Surgeon: Kit Rush, MD;  Location: [facility];  Service: Orthopedics;  Laterality: Left;   KNEE SURGERY Right    reconstruction for patellar dislocation   LAPAROSCOPY     x 2   TOENAIL EXCISION Left 01/11/2022   Procedure: HALLUX TOENAIL PERMANENT EXCISION;  Surgeon: Kit Rush, MD;  Location:  SURGERY CENTER;  Service: Orthopedics;  Laterality: Left;   TUBAL LIGATION     ULNAR NERVE REPAIR Left    UPPER GASTROINTESTINAL ENDOSCOPY     WEIL OSTEOTOMY Left 01/11/2022   Procedure: SECOND, THIRD AND FOURTH WEIL OSTEOTOMIES;  Surgeon: Kit Rush, MD;  Location:  [facility];  Service: Orthopedics;  Laterality: Left;    Family History  Problem Relation Age of Onset   Hypertension Mother    Hyperlipidemia Mother    Congestive Heart Failure Mother    Other Father        killed   COPD Brother    Heart disease Brother    Pulmonary embolism Brother    Colon polyps Maternal Aunt    COPD Paternal Aunt        a lot of aunts and uncle COPD or Emphysema   Emphysema Paternal Uncle    Breast cancer Daughter    Colon cancer Neg Hx    Esophageal cancer Neg Hx    Pancreatic cancer Neg  Hx    Stomach cancer Neg Hx    Liver disease Neg Hx    Rectal cancer Neg Hx    Social History:  reports that she quit smoking about 24 years ago. Her smoking use included cigarettes. She started smoking about 25 years ago. She has a 20 pack-year smoking history. She has never used smokeless tobacco. She reports current alcohol use. She reports that she does not use drugs.  Allergies:  Allergies  Allergen Reactions   Aspirin Other (See Comments)    discomfort   Atorvastatin  Other (See Comments)    Patient states she felt suicidal   Codeine Itching   Crestor  [Rosuvastatin ] Other (See Comments)    Dizzy, leg pain and states she felt suicidal   Gabapentin  Other (See Comments)   Niacin And Related Itching   Sulfa Antibiotics Other (See Comments)    Other reaction(s): Unknown   Sulfamethoxazole-Trimethoprim Other (See Comments)    Unknown reaction per pt   Topiramate  Other (See Comments)    Per patient, it causes stomach burning   Trileptal  [Oxcarbazepine ] Itching    itching   Trimethoprim Other (See Comments)    Unknown, per pt   Ultram [Tramadol] Itching   Vioxx [Rofecoxib] Other (See Comments)    Unknown per pt   Current meds: albuterol  sulfate 2.5 mg/3 mL (0.083 %) solution for nebulization azelastine  137 mcg (0.1 %) nasal spray busPIRone  15 mg tablet cetirizine  10 mg tablet chlorhexidine gluconate 4 % topical liquid clonazePAM  dicyclomine  20 mg tablet doxazosin  4 mg tablet ezetimibe  10 mg tablet fluticasone  250 mcg-salmeteroL 50 mcg/dose blistr powdr for inhalation mirtazapine  45 mg tablet montelukast  10 mg tablet pantoprazole  40 mg tablet,delayed release ProAir  RespiClick 90 mcg/actuation breath activated Repatha  SureClick Symbicort   Review of Systems  Constitutional: Negative.   HENT: Negative.    Eyes: Negative.   Respiratory: Negative.    Cardiovascular: Negative.   Gastrointestinal: Negative.   Endocrine: Negative.   Genitourinary: Negative.    Musculoskeletal:  Positive for arthralgias, gait problem, joint swelling and myalgias.  Skin: Negative.   Psychiatric/Behavioral: Negative.      There were no vitals taken for this visit. Physical Exam Constitutional:      Appearance: Normal appearance.  HENT:     Head: Normocephalic and atraumatic.     Right Ear: External ear normal.     Left Ear: External ear normal.     Nose: Nose normal.     Mouth/Throat:     Pharynx: Oropharynx is clear.   Eyes:     Conjunctiva/sclera: Conjunctivae normal.    Cardiovascular:     Rate and Rhythm: Normal rate and regular rhythm.     Pulses: Normal pulses.  Pulmonary:     Effort: Pulmonary effort is normal.  Abdominal:     General: Abdomen is flat.   Musculoskeletal:     Cervical back: Normal range of motion.     Comments: Ranges -5-110 tender medial joint line patellofemoral pain compression trace effusion no DVT   Skin:    General: Skin is warm and dry.   Neurological:     Mental Status: She is alert.     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 Duwayne on July 9. We again discussed the procedure itself as well as risks, complications and alternatives, including but not limited to DVT, PE, infx, bleeding, failure of procedure, need for secondary procedure including manipulation, nerve injury, ongoing pain/symptoms, anesthesia risk, even stroke or death. Also discussed typical post-op protocols, activity restrictions, need for PT, flexion/extension exercises, time out of work. Discussed need for DVT ppx post-op per protocol. Discussed dental ppx and infx prevention. Also discussed limitations post-operatively such as kneeling and squatting. All questions were answered. Patient desires to proceed with surgery as scheduled.  Will hold  supplements and NSAIDs accordingly. Will remain NPO after midnight the night before surgery. Will present to [facility] for pre-op testing. Anticipate hospital stay to include at least 2 midnights given medical history and to ensure proper pain control. Plan low-dose Eliquis  for DVT ppx post-op. Plan hydrocodone, Robaxin, Colace, Miralax. Plan home with HHPT post-op with family members at home for assistance. Will follow up 10-14 days post-op for suture removal and xrays.  Plan Right total knee replacement  Daisean Brodhead M Charle Mclaurin, PA-C for Dr Duwayne 12/10/2023, 10:50 AM

## 2023-12-13 ENCOUNTER — Ambulatory Visit
Admission: RE | Admit: 2023-12-13 | Discharge: 2023-12-13 | Disposition: A | Discharge status: Home / Self Care | Source: Ambulatory Visit | Attending: Family Medicine | Admitting: Family Medicine

## 2023-12-13 ENCOUNTER — Other Ambulatory Visit: Payer: HMO

## 2023-12-13 ENCOUNTER — Ambulatory Visit: Payer: HMO

## 2023-12-13 DIAGNOSIS — Z1231 Encounter for screening mammogram for malignant neoplasm of breast: Secondary | ICD-10-CM

## 2023-12-16 NOTE — Patient Instructions (Signed)
 SURGICAL WAITING ROOM VISITATION Patients having surgery or a procedure may have no more than 2 support people in the waiting area - these visitors may rotate.    Children under the age of 14 must have an adult with them who is not the patient.  If the patient needs to stay at the hospital during part of their recovery, the visitor guidelines for inpatient rooms apply. Pre-op nurse will coordinate an appropriate time for 1 support person to accompany patient in pre-op.  This support person may not rotate.    Please refer to the [facility] website for the visitor guidelines for Inpatients (after your surgery is over and you are in a regular room).       Your procedure is scheduled on: 12-25-23   Report to [facility] Main Entrance    Report to admitting at 6:15 AM   Call this number if you have problems the morning of surgery [phone]   Do not eat food :After Midnight.   After Midnight you may have the following liquids until 5:30 AM DAY OF SURGERY  Water Non-Citrus Juices (without pulp, NO RED-Apple, White grape, White cranberry) Black Coffee (NO MILK/CREAM OR CREAMERS, sugar ok)  Clear Tea (NO MILK/CREAM OR CREAMERS, sugar ok) regular and decaf                             Plain Jell-O (NO RED)                                           Fruit ices (not with fruit pulp, NO RED)                                     Popsicles (NO RED)                                                               Sports drinks like Gatorade (NO RED)                   The day of surgery:  Drink ONE (1) Pre-Surgery Clear Ensure by 5:30 AM the morning of surgery. Drink in one sitting. Do not sip.  This drink was given to you during your hospital  pre-op appointment visit. Nothing else to drink after completing the Pre-Surgery Clear Ensure           If you have questions, please contact your surgeon's office.   FOLLOW  ANY ADDITIONAL PRE OP INSTRUCTIONS YOU RECEIVED FROM YOUR SURGEON'S  OFFICE!!!     Oral Hygiene is also important to reduce your risk of infection.                                    Remember - BRUSH YOUR TEETH THE MORNING OF SURGERY WITH YOUR REGULAR TOOTHPASTE   Do NOT smoke after Midnight   Take these medicines the morning of surgery with A SIP OF WATER:    Bupropion   Buspirone    Clonazepam    Ezetimige   Pantoprazole    Zyrtec    Okay to use inhalers and nasal spray   Tylenol  if needed  Stop all vitamins and herbal supplements 7 days before surgery  Bring CPAP mask and tubing day of surgery.                              You may not have any metal on your body including hair pins, jewelry, and body piercing             Do not wear make-up, lotions, powders, perfumes or deodorant  Do not wear nail polish including gel and S&S, artificial/acrylic nails, or any other type of covering on natural nails including finger and toenails. If you have artificial nails, gel coating, etc. that needs to be removed by a nail salon please have this removed prior to surgery or surgery may need to be canceled/ delayed if the surgeon/ anesthesia feels like they are unable to be safely monitored.   Do not shave  48 hours prior to surgery.    Do not bring valuables to the hospital. [facility] IS NOT RESPONSIBLE   FOR VALUABLES.   Contacts, dentures or bridgework may not be worn into surgery.   Bring small overnight bag day of surgery.   DO NOT BRING YOUR HOME MEDICATIONS TO THE HOSPITAL. PHARMACY WILL DISPENSE MEDICATIONS LISTED ON YOUR MEDICATION LIST TO YOU DURING YOUR ADMISSION IN THE HOSPITAL!    Special Instructions: Bring a copy of your healthcare power of attorney and living will documents the day of surgery if you haven't scanned them before.              Please read over the following fact sheets you were given: IF YOU HAVE QUESTIONS ABOUT YOUR PRE-OP INSTRUCTIONS PLEASE CALL [phone] Gwen  If you received a COVID test during your pre-op visit  it  is requested that you wear a mask when out in public, stay away from anyone that may not be feeling well and notify your surgeon if you develop symptoms. If you test positive for Covid or have been in contact with anyone that has tested positive in the last 10 days please notify you surgeon.    Pre-operative 5 CHG Bath Instructions   You can play a key role in reducing the risk of infection after surgery. Your skin needs to be as free of germs as possible. You can reduce the number of germs on your skin by washing with CHG (chlorhexidine gluconate) soap before surgery. CHG is an antiseptic soap that kills germs and continues to kill germs even after washing.   DO NOT use if you have an allergy to chlorhexidine/CHG or antibacterial soaps. If your skin becomes reddened or irritated, stop using the CHG and notify one of our RNs at [phone].   Please shower with the CHG soap starting 4 days before surgery using the following schedule:     Please keep in mind the following:  DO NOT shave, including legs and underarms, starting the day of your first shower.   You may shave your face at any point before/day of surgery.  Place clean sheets on your bed the day you start using CHG soap. Use a clean washcloth (not used since being washed) for each shower. DO NOT sleep with pets once you start using the CHG.   CHG Shower Instructions:  If you choose to  wash your hair and private area, wash first with your normal shampoo/soap.  After you use shampoo/soap, rinse your hair and body thoroughly to remove shampoo/soap residue.  Turn the water OFF and apply about 3 tablespoons (45 ml) of CHG soap to a CLEAN washcloth.  Apply CHG soap ONLY FROM YOUR NECK DOWN TO YOUR TOES (washing for 3-5 minutes)  DO NOT use CHG soap on face, private areas, open wounds, or sores.  Pay special attention to the area where your surgery is being performed.  If you are having back surgery, having someone wash your back for  you may be helpful. Wait 2 minutes after CHG soap is applied, then you may rinse off the CHG soap.  Pat dry with a clean towel  Put on clean clothes/pajamas   If you choose to wear lotion, please use ONLY the CHG-compatible lotions on the back of this paper.     Additional instructions for the day of surgery: DO NOT APPLY any lotions, deodorants, cologne, or perfumes.   Put on clean/comfortable clothes.  Brush your teeth.  Ask your nurse before applying any prescription medications to the skin.      CHG Compatible Lotions   Aveeno Moisturizing lotion  Cetaphil Moisturizing Cream  Cetaphil Moisturizing Lotion  Clairol Herbal Essence Moisturizing Lotion, Dry Skin  Clairol Herbal Essence Moisturizing Lotion, Extra Dry Skin  Clairol Herbal Essence Moisturizing Lotion, Normal Skin  Curel Age Defying Therapeutic Moisturizing Lotion with Alpha Hydroxy  Curel Extreme Care Body Lotion  Curel Soothing Hands Moisturizing Hand Lotion  Curel Therapeutic Moisturizing Cream, Fragrance-Free  Curel Therapeutic Moisturizing Lotion, Fragrance-Free  Curel Therapeutic Moisturizing Lotion, Original Formula  Eucerin Daily Replenishing Lotion  Eucerin Dry Skin Therapy Plus Alpha Hydroxy Crme  Eucerin Dry Skin Therapy Plus Alpha Hydroxy Lotion  Eucerin Original Crme  Eucerin Original Lotion  Eucerin Plus Crme Eucerin Plus Lotion  Eucerin TriLipid Replenishing Lotion  Keri Anti-Bacterial Hand Lotion  Keri Deep Conditioning Original Lotion Dry Skin Formula Softly Scented  Keri Deep Conditioning Original Lotion, Fragrance Free Sensitive Skin Formula  Keri Lotion Fast Absorbing Fragrance Free Sensitive Skin Formula  Keri Lotion Fast Absorbing Softly Scented Dry Skin Formula  Keri Original Lotion  Keri Skin Renewal Lotion Keri Silky Smooth Lotion  Keri Silky Smooth Sensitive Skin Lotion  Nivea Body Creamy Conditioning Oil  Nivea Body Extra Enriched Lotion  Nivea Body Original Lotion  Nivea Body  Sheer Moisturizing Lotion Nivea Crme  Nivea Skin Firming Lotion  NutraDerm 30 Skin Lotion  NutraDerm Skin Lotion  NutraDerm Therapeutic Skin Cream  NutraDerm Therapeutic Skin Lotion  ProShield Protective Hand Cream  Provon moisturizing lotion   PATIENT SIGNATURE_________________________________  NURSE SIGNATURE__________________________________  ________________________________________________________________________    Nasario Exon  An incentive spirometer is a tool that can help keep your lungs clear and active. This tool measures how well you are filling your lungs with each breath. Taking long deep breaths may help reverse or decrease the chance of developing breathing (pulmonary) problems (especially infection) following: A long period of time when you are unable to move or be active. BEFORE THE PROCEDURE  If the spirometer includes an indicator to show your best effort, your nurse or respiratory therapist will set it to a desired goal. If possible, sit up straight or lean slightly forward. Try not to slouch. Hold the incentive spirometer in an upright position. INSTRUCTIONS FOR USE  Sit on the edge of your bed if possible, or sit up as far as you can in  bed or on a chair. Hold the incentive spirometer in an upright position. Breathe out normally. Place the mouthpiece in your mouth and seal your lips tightly around it. Breathe in slowly and as deeply as possible, raising the piston or the ball toward the top of the column. Hold your breath for 3-5 seconds or for as long as possible. Allow the piston or ball to fall to the bottom of the column. Remove the mouthpiece from your mouth and breathe out normally. Rest for a few seconds and repeat Steps 1 through 7 at least 10 times every 1-2 hours when you are awake. Take your time and take a few normal breaths between deep breaths. The spirometer may include an indicator to show your best effort. Use the indicator as a goal  to work toward during each repetition. After each set of 10 deep breaths, practice coughing to be sure your lungs are clear. If you have an incision (the cut made at the time of surgery), support your incision when coughing by placing a pillow or rolled up towels firmly against it. Once you are able to get out of bed, walk around indoors and cough well. You may stop using the incentive spirometer when instructed by your caregiver.  RISKS AND COMPLICATIONS Take your time so you do not get dizzy or light-headed. If you are in pain, you may need to take or ask for pain medication before doing incentive spirometry. It is harder to take a deep breath if you are having pain. AFTER USE Rest and breathe slowly and easily. It can be helpful to keep track of a log of your progress. Your caregiver can provide you with a simple table to help with this. If you are using the spirometer at home, follow these instructions: SEEK MEDICAL CARE IF:  You are having difficultly using the spirometer. You have trouble using the spirometer as often as instructed. Your pain medication is not giving enough relief while using the spirometer. You develop fever of 100.5 F (38.1 C) or higher. SEEK IMMEDIATE MEDICAL CARE IF:  You cough up bloody sputum that had not been present before. You develop fever of 102 F (38.9 C) or greater. You develop worsening pain at or near the incision site. MAKE SURE YOU:  Understand these instructions. Will watch your condition. Will get help right away if you are not doing well or get worse. Document Released: 10/15/2006 Document Revised: 08/27/2011 Document Reviewed: 12/16/2006 [facility] Patient Information 2014 [place], [place].   ________________________________________________________________________

## 2023-12-16 NOTE — Progress Notes (Addendum)
 COVID Vaccine Completed:  Date of COVID positive in last 90 days:  PCP - Heron Sharper, MD Cardiologist - Maude Emmer, MD Pulmonologist Dorn Chill, MD  Cardiac clearance in Epic dated 10-30-23  Medical clearance under media  Chest x-ray -  EKG -  Stress Test - 02-11-15 Epic ECHO - 01-26-20 Epic Cardiac Cath -  Pacemaker/ICD device last checked: Spinal Cord Stimulator: Cardiac CT 07-21-21 Epic  Bowel Prep -   Sleep Study -  CPAP -   Fasting Blood Sugar -  Checks Blood Sugar _____ times a day  Last dose of GLP1 agonist-  N/A GLP1 instructions:  Do not take after     Last dose of SGLT-2 inhibitors-  N/A SGLT-2 instructions:  Do not take after     Blood Thinner Instructions:  Last dose:   Time: Aspirin Instructions: Last Dose:  Activity level:  Can go up a flight of stairs and perform activities of daily living without stopping and without symptoms of chest pain or shortness of breath.  Able to exercise without symptoms  Unable to go up a flight of stairs without symptoms of     Anesthesia review: COPD, pulmonary fibroisis, CAD, murmur   Patient denies shortness of breath, fever, cough and chest pain at PAT appointment  Patient verbalized understanding of instructions that were given to them at the PAT appointment. Patient was also instructed that they will need to review over the PAT instructions again at home before surgery.

## 2023-12-17 ENCOUNTER — Encounter: Payer: Self-pay | Admitting: Psychiatry

## 2023-12-17 ENCOUNTER — Ambulatory Visit: Payer: Self-pay | Admitting: Family Medicine

## 2023-12-17 ENCOUNTER — Ambulatory Visit (INDEPENDENT_AMBULATORY_CARE_PROVIDER_SITE_OTHER): Admitting: Psychiatry

## 2023-12-17 DIAGNOSIS — F515 Nightmare disorder: Secondary | ICD-10-CM

## 2023-12-17 DIAGNOSIS — F4310 Post-traumatic stress disorder, unspecified: Secondary | ICD-10-CM

## 2023-12-17 DIAGNOSIS — F4312 Post-traumatic stress disorder, chronic: Secondary | ICD-10-CM

## 2023-12-17 DIAGNOSIS — F4001 Agoraphobia with panic disorder: Secondary | ICD-10-CM | POA: Diagnosis not present

## 2023-12-17 DIAGNOSIS — F39 Unspecified mood [affective] disorder: Secondary | ICD-10-CM | POA: Diagnosis not present

## 2023-12-17 DIAGNOSIS — F5105 Insomnia due to other mental disorder: Secondary | ICD-10-CM

## 2023-12-17 MED ORDER — MIRTAZAPINE 45 MG PO TABS: 45.0000 mg | ORAL_TABLET | Freq: Every day | ORAL | 1 refills | Status: DC

## 2023-12-17 MED ORDER — DOXAZOSIN MESYLATE 4 MG PO TABS: 8.0000 mg | ORAL_TABLET | Freq: Every day | ORAL | 0 refills | Status: AC

## 2023-12-17 NOTE — Progress Notes (Signed)
 Brittany [name] [id number] October 12, 1955 68 y.o.     Subjective:   Patient ID:  Brittany [name] is a 68 y.o. (DOB [birth year]) female.  Chief Complaint:  Chief Complaint  Patient presents with   Follow-up   Depression   Anxiety   Stress   Medication Problem    Brittany [name] presents to the office today for follow-up of long-term depression and anxiety and insomnia.    At visit September 08, 2018 at which time the patient was weaned off of gabapentin  and started on Trileptal  up to twice daily.  She has been very med sensitive and difficult to treat because of that.  When seen September 19, 2018.  No meds were changed.  She stopped oxcarbazapine on her own DT itching.  seen July 2020.  She did not want to try any additional medications despite ongoing depression.  She wanted to stop lithium  despite risk of worsening depression.  She was given instructions about how to do it in the safest manner possible.  Last seen August 12, 2019..   Still taking diazepam  5 TID, mirtazapine  15, doxazosin  increased to 8 mg nightly for nightmares from 6 mg.    10/16/2019 appointment, the following noted: NM much better with increase prazosin to 8 mg HS and anxiety better that was related and tolerated.  Overall anxiety still a problem.  depression and anxiety much worse for a week after 2nd Covid vaccine recently.   Also overall more anxious and agoraphobia is bad.  Does force herself to go to [employer] weekly.   Averaging diazepam  5 mg BID-TID daily.  No drowsiness.  Avoids more at night bc of worsening NM from it. Weird dreams and occ awakens with panic.  Lost another fur baby that's 2 in 1 year early 2021.  $ and health stress.  Dreams are worse than last visit.  No SE.  Mirtazapine  helps sleep generally and much better with it than without it.  Post Covid November 2020. Got significantly better in depression since here.  Prayed a lot about it and thought the lithium  was helping.  Feels better now off it.  Was  going through a lot at the time.  But after a month more peace and joy.   Buspirone  helped.  Diazepam  better than Xanax  which she feels triggered dissociation and irritabilty.  Lost her dog which is hard.  Prayed and that helped.  Oldest GS joined special forces and gave her his car.  Anxiety driving but does it.   Plan: no med changes  02/09/20 appt with the following noted: Went back down on doxazosin  to 4 mg bc of dizziness but it's not better.  She plans to talk to her other doctors about it.. CO anxiety and wanting to take more meds for it.  Anxiety without reason generally. Increased Diazepam  from 5 mg TID to QID end May 2021.   Worsening mirtazapine  also.  Has done some night eating without memory the next day. Doesn't seem to be anxiety causing insomnia.  Plan: Start topiramate  off label for anxiety and weight loss at 25 mg daily and increase gradually to 100 mg daily.  06/21/2020 appointment with the following noted: Had problems with night terrors again with stress around $ and pet sick.  Getting better again.   Still taking topiramate  100 mg started last visit. Lost weight DT stomach problems.  Doctors cannot explain.  Feels like poor absorption of nutrients.  Difficulty tolerating foods.  Dx bacterial overgrowth in small  intestine but corrected. Stomach constantly burns.  Sx were present before topiramate ? Anxiety is OK if stays home but is worse in public.  Meds are helping anxiety.  Not markedly depressed.  Meds are helping.  11/30/20 appt noted: Chronic anxiety averaging ? Amount daily of diazepam .  Anxiety is higher out of the house.  Gets out of the house regularly to help care for 7 mos old GS.  D took her to beach last week.   Has 5 gallon fish tank with some guppies.   Occ night terrrors. Occ EMA Taking doxazosin  6 mg HS, 4 mg was less effective. Generally more trouble with anxiety than depres but some depression occ with $ stress and despise where she lives.   Ketogenic  diet helped GI usually but some pain today.  Haunted with medical bills. Had Covid hosp 11 days and then home with O2 for a month.  Pulm said don't get Covid again.  Keeps her from going to church regularly but that would help. Plan: Also topiramate  Continue buspirone  15 mg 3 times daily,  and mirtazapine  30 mg nightly for sleep.  Has been compliant with Valium .  She asks about an increase to increase.  OK continue Valium  5 mg  QID prn but take LED. Cont buspirone  15 mg TID, doxazosin  6-8 mg HS  03/29/2021 appointment with the following noted: Has aquarium. Doing OK except some seasonal depression and irritability noticed. Increased doxazosin  between 6-8 mg HS.  Still having weird dreams but not having NM or night terrrors usually. No med changes  09/27/2021 appointment with the following noted: A lot better than normal Oct=Feb.  New GS, D married and some things to look forward to but still some anniversaries of losses.  Helping D with 42 mo old GS helps her a lot.  But did gain weight over the winter.  Dx CAD and put on Statin with cog and depressive and anxiety SE.  Current one not as bad but a little more anxiety and using a little more diazepam .  Nervous about people working in her appt. Going to [facility] on plane for first time with her family.  Excited but nervous too.   Plan no med changes  01/25/2022 appointment noted: No concerns about psych meds except Chronic $ stress.  Has to make choices about essentials.  I put myself into dept living on credit cards. Will go to sleep but EMA 40% of time despite mirtazapine .  Not usually a napper but on occ.    Can't tolerate statins. Even with CoQ10. Chronic med sensitivity.  Brought Genesight test. Got SI about 2-4 weeks into statin and better off it after 3-4 weeks off it. Minimal depression at this time.  Some chronic anxiety which is worse than depression. Disc problematic night eating.  07/02/2022 phone call complaining of high  depression and anxiety.  Appointment was moved up.  07/03/22 appointment noted: Current psych meds diazepam  5 mg every 6 hours as needed, mirtazapine  45 mg nightly, buspirone  15 mg 3 times daily, doxazosin  4 to 8 mg nightly prn. Thinks mood got worse when on statins.  Stopped it in summer.  Then took red yeast rice and had a little with it too.   Gained wt in summer from foot surgery.  $ stress from living on credit cards. Complaining of much worse depression and anxiety.  Increased SI persistent like 10-15 years ago. Don't like crowds.  Frustrated and irritable. Thoutghts jumbled. Was at diazepam  5 mg 3 daily  and now 4 daily.  At times has taken 10 mg at a time. Plans to restart therapy.  Wants [facility] therapist. Chronically fearful of psych meds and doesn't want to start something extra. Still a lot of GI problems. Plan: DC mirtazapine . Caplyta 42 mg HS trial  07/31/22 TC stopped Caplyta and doesn't want to try another med.  Complained of dizziness and being cold with it.  Didn't make her sleepy.    09/04/22 appt noted: Resumed mirtazapine  45 HS, diazepam  5 , buspirone  15 TID, doxazosine 4-8 mg HS. NM occ but less severe.   Still has some EMA.  She doesn't want to change doses. Seeing therapist has helped her cope and mood and anxiety is better.  She doesn't feel she needs more meds or other meds.  No severe mood swings.  Not severely depressed.  Anxiety manageable usually.   Was more irritable when visited D and 2yo grandson.  Did miss meds more while there. No SE with current meds. Insurance will cover 100 days of meds. I think I have a control problem.   Plan no changes  01/01/23 appt noted: Meds as above:  buspirone  15 TID, diazepam  5-10 mg TID prn, doxazosin  4-8 mg HS, mirtazapine  45 HS. Anxiety worse in the am.  Has increased diazepam  use and feels it is less effective.  More $ strain DT inflation & credit card debt and trying to get out of the problem. Lost 30#.  Disc wt loss and  temp sensitivity.  Stopped bread and sugar and dairy.   Tries to walk her dog daily.   Working on heart health bc genetics.   Night terrors stopped and reduced doxazosin  but didn't slep as well.  So went back up and didn't solve the problems.   More EMA than before.  Sometimes naps an hour.   Mood rollercoaster.  This weekend dep with SI without reason. New cholesterol med; not a statin.  HX statins with strong SI. Mind constantly racing on random things with some worry related to health and $. At times dep worse than anxiety.   Thinks mirtazapine  may help her fall asleep faster.  Doesn't sleep any better with less than 45 mg mirtazapine .   Plan: Continue buspirone  15 mg 3 times daily,  mirtazapine  per her request at 22.5-45 mg HS.  SABRA Switch clonazepam  0.5 mg BID & 1mg  hS Continue Vitamin D  Doxazosin  8 mg HS  03/05/23 appt noted: Re: switch from diazepam  to clonazepam  I wish you had switched me years ago. Slowed down racing thoughts.  Sleep is a little better.  Not as anxious when awaken at night.  Usually anxiety is worse in the am and not as bad.  Response to clonazepam  is smoother.  Not as sensitive to a missed dose.  Family under a lot of stress right now.  D dx with aggressive breast Ca.  D married with a 15 yo son.  D more anxious and dep and pending surgery and chemo.  Pending double mastectomy and radiation.  She is first person pt knows personally with this.   Plan no changes  06/14/23 appt noted: Still in therapy with Marval Bunde LCSW. Psych meds: as above D battling breast CA. Pretty good considering re: mental health.  Increased vitamin D  for the winter. Low Motivation and impulsive eating.  Fought so hard to lose wt and realized genetics for high cholesterol.  Working to change mind set.   Better focus and less mind racing with clonazepam .  Occ night without sleep.  Last night EMA 2 AM and back to sleep at 6.  Now about once every couple weeks with clonazepam .   Doxazosin   stopped NM and night terrors.  Only a couple in last 3 mos. Brittany [name] is oldest D's 76 th birthday; she is the one who only lived 2 days.  Yesterday was a bad day.   Hasn't read much in years.  But plans to get back into it.   Wonders about hypnosis for eating problems and impulse control with spending.   Has made progress on latter. No mania.  Plan: Continue buspirone  15 mg 3 times daily,  mirtazapine  per her request at 22.5-45 mg HS.  SABRA Better with Switch clonazepam  0.5 mg BID & 1mg  hS vs diazepam . Continue Vitamin D  Continue doxazosin  8 mg HS for NM  10/15/23 appt note: Med as above. Not sad nor dep but not much motivated to do chores.  Off and on for years but steady for mos.  Seems to get worse.   Out of work since 2010.   Anxiety managed pretty well. Not sleepy with clonazepam .   Sleep pretty good.  NM none for awhile until recent when woke in panic.  Sometimes triggered by TV program or her sister.  A whole lot better. Rare daytime panic triggered.  12/17/23 appt note: Med:  buspirone  15 TID, clonazepam  0.25 mg BID and 1 mg HS, doxazosin  4-8 mg HS, mirtazapine  45 HS.  Wellbutrin  XL 150 AM Took Wellbutrin  300 for a couple of weeks but SE excessive sweating.  Also didn't notice benefit. Reduced to 150 mg daily until now.  No change with it.   Sweating is better with less but not gone.  Not real down but not very motivated nor productivity.   Plays Bingo weekly and coffee time gathering where she lives weekly.  Trouble conc on phone games.   Asthma flare with heat.  Chronic GI issues.   TKR pending next week.  No current pain meds.  Disc pending PT.   Would like help with conc, inattention, low interest.   Anxiety is a lot better with clonazepam .  And sleep better Sleep variable with a little longer to sleep than with diazepam .  Rare EMA  Worries about weight gain with meds generally fearful of meds.  Chronic $ problems.  Stopped therapy about September 2020. Out of work since  2010.    Multiple med failures.  Med sensitive & cost limit options. restoril, diazepam  is ok. NO MORE Xanax  DT dissociation and irritability,   Trazodone NR, Ambien, hyrdroxyzine, prazosin, doxazosin ,  propranolol,  mirtazapine  45 sertraline, fluoxetine NR, Lexapro,   Buspar  15 TID, gabapentin , Wellbutrin  25 y ago helped stop smoking.  Retry recently with SE sweating at 300 Latuda, Olanzapine, risperidone with AE, Seroquel SE, Geodon, Saphris,  Abilify, Rexulti seemed to help for short while but $, Caplyta 42 dizzy  Trileptal  itching, lithium  NR, CBZ SE, Lamotrigine, VPA,  History statins strong SI  Review of Systems:  Review of Systems  Constitutional:  Positive for fatigue.  HENT:  Negative for congestion.   Respiratory:  Positive for shortness of breath and wheezing.   Gastrointestinal:  Negative for abdominal distention.  Musculoskeletal:  Positive for back pain.  Neurological:  Negative for dizziness, tremors and light-headedness.  Psychiatric/Behavioral:  Positive for sleep disturbance. Negative for agitation, behavioral problems, confusion, decreased concentration, dysphoric mood, hallucinations, self-injury and suicidal ideas. The patient is nervous/anxious. The patient is not hyperactive.  Please refer to HPI  Stress eating.  Medications: I have reviewed the patient's current medications.  Current Outpatient Medications  Medication Sig Dispense Refill   acetaminophen  (TYLENOL ) 500 MG tablet Take 500-1,000 mg by mouth every 6 (six) hours as needed (pain.).     albuterol  (PROVENTIL ) (2.5 MG/3ML) 0.083% nebulizer solution Take 3 mLs (2.5 mg total) by nebulization every 6 (six) hours as needed for wheezing or shortness of breath. 75 mL 12   Albuterol  Sulfate (PROAIR  RESPICLICK) 108 (90 Base) MCG/ACT AEPB Inhale 2 puffs into the lungs every 6 (six) hours as needed. 1 each 5   Ascorbic Acid  (VITA-C PO) Take 1 tablet by mouth daily.     Azelastine  HCl 137 MCG/SPRAY SOLN  SPRAY 1 SPRAY IN EACH NOSTRIL TWICE DAILY 30 mL 5   busPIRone  (BUSPAR ) 15 MG tablet Take 1 tablet (15 mg total) by mouth 3 (three) times daily. 270 tablet 1   cetirizine  (ZYRTEC ) 10 MG tablet Take 1 tablet (10 mg total) by mouth daily. 90 tablet 1   Cholecalciferol (VITAMIN D -3 PO) Take 5,000 Units by mouth daily.     clonazePAM  (KLONOPIN ) 0.5 MG tablet TAKE 1 TABLET BY MOUTH 2 TIMES A DAY AND TAKE TWO TABLETS BY MOUTH AT BEDTIME (Patient taking differently: Taking 1/2 tablet  BID and 2 tablets at night) 360 tablet 0   dicyclomine  (BENTYL ) 10 MG capsule Take 1 capsule (10 mg total) by mouth as needed for spasms. 90 capsule 2   Evolocumab  (REPATHA  SURECLICK) 140 MG/ML SOAJ Inject 140 mg into the skin every 14 (fourteen) days. 6 mL 3   ezetimibe  (ZETIA ) 10 MG tablet TAKE 1 TABLET BY MOUTH DAILY 90 tablet 1   famotidine  (PEPCID ) 20 MG tablet Take 1 tablet (20 mg total) by mouth at bedtime. 90 tablet 3   Ferrous Sulfate (IRON PO) Take 1 tablet by mouth daily.     fluticasone  (FLONASE ) 50 MCG/ACT nasal spray USE 2 SPRAYS INTO BOTH NOSTRILS TWICE DAILY (Patient taking differently: Place 2 sprays into both nostrils in the morning and at bedtime.) 96 mL 2   fluticasone -salmeterol (ADVAIR) 250-50 MCG/ACT AEPB Inhale 1 puff into the lungs every 12 (twelve) hours. 180 each 1   ibuprofen (ADVIL) 200 MG tablet Take 200-400 mg by mouth every 8 (eight) hours as needed (pain.).     MAGNESIUM PO Take 1 tablet by mouth daily.     montelukast  (SINGULAIR ) 10 MG tablet TAKE 1 TABLET BY MOUTH AT BEDTIME 100 tablet 0   Multiple Vitamin (MULTIVITAMIN WITH MINERALS) TABS tablet Take 1 tablet by mouth daily with breakfast.     Omega-3 Fatty Acids (OMEGA 3 PO) Take 1 capsule by mouth daily.     pantoprazole  (PROTONIX ) 40 MG tablet Take 1 tablet (40 mg total) by mouth 2 (two) times daily. Take 30 min to 1 hr before morning coffee and dinner. 90 tablet 3   Scar Treatment Products (SCAR EX) Apply 1 Application topically as  needed (bruises).     sodium chloride  (OCEAN) 0.65 % SOLN nasal spray Place 1 spray into both nostrils in the morning and at bedtime.     Vitamin D -Vitamin K (D3 + K2 PO) Take 1 tablet by mouth daily.     doxazosin  (CARDURA ) 4 MG tablet Take 2 tablets (8 mg total) by mouth at bedtime. 180 tablet 0   mirtazapine  (REMERON ) 45 MG tablet Take 1 tablet (45 mg total) by mouth at bedtime. 90 tablet 1   No current facility-administered  medications for this visit.    Medication Side Effects: None  Allergies:  Allergies  Allergen Reactions   Aspirin Other (See Comments)    discomfort   Atorvastatin  Other (See Comments)    Patient states she felt suicidal   Codeine Itching   Crestor  [Rosuvastatin ] Other (See Comments)    Dizzy, leg pain and states she felt suicidal   Gabapentin  Other (See Comments)   Niacin And Related Itching   Sulfa Antibiotics Other (See Comments)    Other reaction(s): Unknown   Sulfamethoxazole-Trimethoprim Other (See Comments)    Unknown reaction per pt   Topiramate  Other (See Comments)    Per patient, it causes stomach burning   Trileptal  [Oxcarbazepine ] Itching    itching   Trimethoprim Other (See Comments)    Unknown, per pt   Ultram [Tramadol] Itching   Vioxx [Rofecoxib] Other (See Comments)    Unknown per pt    Past Medical History:  Diagnosis Date   Abnormal EKG    Allergy    Anxiety    Arthritis    Asthma    Bipolar depression (HCC), Anxiety, PTSD, Panic disorder    -managed by [facility]   CAD (coronary artery disease)    Depression    Foot pain    Gallstones    GERD (gastroesophageal reflux disease)    Heart murmur    as a child   HLD (hyperlipidemia)    Insomnia    Leg swelling    Obesity    Osteoporosis    osteopenia   Pneumonia due to COVID-19 virus 04/22/2019   Tachycardia     Family History  Problem Relation Age of Onset   Hypertension Mother    Hyperlipidemia Mother    Congestive Heart Failure Mother    Other  Father        killed   COPD Brother    Heart disease Brother    Pulmonary embolism Brother    Colon polyps Maternal Aunt    COPD Paternal Aunt        a lot of aunts and uncle COPD or Emphysema   Emphysema Paternal Uncle    Breast cancer Daughter    Colon cancer Neg Hx    Esophageal cancer Neg Hx    Pancreatic cancer Neg Hx    Stomach cancer Neg Hx    Liver disease Neg Hx    Rectal cancer Neg Hx     Social History   Socioeconomic History   Marital status: Single    Spouse name: Not on file   Number of children: 3   Years of education: Not on file   Highest education level: Associate degree: occupational, Scientist, product/process development, or vocational program  Occupational History   Occupation: disabled  Tobacco Use   Smoking status: Former    Current packs/day: 0.00    Average packs/day: 20.0 packs/day for 1 year (20.0 ttl pk-yrs)    Types: Cigarettes    Start date: 06/18/1998    Quit date: 06/19/1999    Years since quitting: 24.5   Smokeless tobacco: Never   Tobacco comments:    20 year estimate but probably less   Vaping Use   Vaping status: Never Used  Substance and Sexual Activity   Alcohol use: Yes    Alcohol/week: 0.0 standard drinks of alcohol    Comment: occ   Drug use: No   Sexual activity: Never  Other Topics Concern   Not on file  Social History Narrative   Work  or School: Disabled seconndary to psychiatric conditions      Home Situation: lives alone with 2 cats and one dog      Spiritual Beliefs: Christian      Lifestyle: no regular exercise, diet  Not great - wants to embark on healthier lifestyle   Social Drivers of Health   Financial Resource Strain: Medium Risk (12/01/2023)   Overall Financial Resource Strain (CARDIA)    Difficulty of Paying Living Expenses: Somewhat hard  Food Insecurity: Food Insecurity Present (12/01/2023)   Hunger Vital Sign    Worried About Running Out of Food in the Last Year: Sometimes true    Ran Out of Food in the Last Year: Sometimes  true  Transportation Needs: No Transportation Needs (12/01/2023)   PRAPARE - Administrator, Civil Service (Medical): No    Lack of Transportation (Non-Medical): No  Physical Activity: Insufficiently Active (12/01/2023)   Exercise Vital Sign    Days of Exercise per Week: 2 days    Minutes of Exercise per Session: 30 min  Stress: Stress Concern Present (12/01/2023)   [employer] of Occupational Health - Occupational Stress Questionnaire    Feeling of Stress: To some extent  Social Connections: Moderately Isolated (12/01/2023)   Social Connection and Isolation Panel    Frequency of Communication with Friends and Family: Three times a week    Frequency of Social Gatherings with Friends and Family: Once a week    Attends Religious Services: More than 4 times per year    Active Member of [employer] or Organizations: No    Attends Banker Meetings: Not on file    Marital Status: Divorced  Intimate Partner Violence: Not At Risk (12/02/2023)   Humiliation, Afraid, Rape, and Kick questionnaire    Fear of Current or Ex-Partner: No    Emotionally Abused: No    Physically Abused: No    Sexually Abused: No    Past Medical History, Surgical history, Social history, and Family history were reviewed and updated as appropriate.   Divorced twice.  Please see review of systems for further details on the patient's review from today.   Objective:   Physical Exam:  There were no vitals taken for this visit.  Physical Exam Constitutional:      General: She is not in acute distress.  Musculoskeletal:        General: No deformity.   Neurological:     Mental Status: She is alert and oriented to person, place, and time.     Cranial Nerves: No dysarthria.     Coordination: Coordination normal.   Psychiatric:        Attention and Perception: Attention and perception normal. She does not perceive auditory or visual hallucinations.        Mood and Affect: Mood is anxious.  Mood is not depressed. Affect is not labile or inappropriate.        Speech: Speech normal. Speech is not rapid and pressured or slurred.        Behavior: Behavior normal. Behavior is cooperative.        Thought Content: Thought content normal. Thought content is not paranoid or delusional. Thought content does not include homicidal or suicidal ideation. Thought content does not include suicidal plan.        Cognition and Memory: Cognition and memory normal.        Judgment: Judgment normal.     Comments: Insight and judgment fair.  not overtly manic.  chronic $  stress Anxiety and depression are better not gone.  CC low motivation. Talkative Racing thoughts better.     Lab Review:     Component Value Date/Time   NA 139 04/26/2023 1125   NA 141 11/17/2021 0835   K 4.1 04/26/2023 1125   CL 105 04/26/2023 1125   CO2 28 04/26/2023 1125   GLUCOSE 92 04/26/2023 1125   BUN 19 04/26/2023 1125   BUN 16 11/17/2021 0835   CREATININE 0.73 04/26/2023 1125   CREATININE 0.94 04/06/2020 0916   CALCIUM  9.2 04/26/2023 1125   PROT 6.3 05/22/2023 1044   ALBUMIN 4.4 05/22/2023 1044   AST 13 05/22/2023 1044   ALT 16 05/22/2023 1044   ALKPHOS 61 05/22/2023 1044   BILITOT 0.3 05/22/2023 1044   GFRNONAA >60 05/01/2019 0155   GFRAA >60 05/01/2019 0155       Component Value Date/Time   WBC 6.8 04/26/2023 1125   RBC 4.89 04/26/2023 1125   HGB 15.5 (H) 04/26/2023 1125   HCT 45.8 04/26/2023 1125   PLT 338.0 04/26/2023 1125   MCV 93.8 04/26/2023 1125   MCH 31.7 04/06/2020 0916   MCHC 33.8 04/26/2023 1125   RDW 13.6 04/26/2023 1125   LYMPHSABS 0.8 05/10/2021 0934   MONOABS 0.3 05/10/2021 0934   EOSABS 0.1 05/10/2021 0934   BASOSABS 0.0 05/10/2021 0934    No results found for: POCLITH, LITHIUM    Lab Results  Component Value Date   VALPROATE 25.3 (L) 10/26/2008     .res Assessment: Plan:    Kasiya was seen today for follow-up, depression, anxiety, stress and medication  problem.  Diagnoses and all orders for this visit:  Episodic mood disorder (HCC)  PTSD (post-traumatic stress disorder) -     mirtazapine  (REMERON ) 45 MG tablet; Take 1 tablet (45 mg total) by mouth at bedtime.  Panic disorder with agoraphobia -     mirtazapine  (REMERON ) 45 MG tablet; Take 1 tablet (45 mg total) by mouth at bedtime.  Nightmares associated with chronic post-traumatic stress disorder -     mirtazapine  (REMERON ) 45 MG tablet; Take 1 tablet (45 mg total) by mouth at bedtime. -     doxazosin  (CARDURA ) 4 MG tablet; Take 2 tablets (8 mg total) by mouth at bedtime.  Insomnia due to mental condition -     mirtazapine  (REMERON ) 45 MG tablet; Take 1 tablet (45 mg total) by mouth at bedtime.   Chronic noncompliance complicates treatment but bettter lately Likey borderline personality disorder vs PTSD complication.  30 min face to face time with patient. We discussed her chronic sx of depression, anxiety and insomnia combined with medication sensitivity which makes it very difficult to treat her.  Multiple med failures.  Limited insight into bipolar vs PD and has refused mood stabilizers.  This was discussed with her repeatedly. But she has not had manic episodes off mood stabilizers. Seems unusual that she's not having manic episodes apparently without mood stabilizer.  ? Racing thought related but not real prominent euphoria or irritability.  Cycles of dep.    May change dx back to major depression. Her faith helps. Still not reporting manic phases.    At the visit July 03, 2022 she is complaining of more symptoms of depression and anxiety and irritability.  She is having additional financial stress but financial stress has been chronic.  She feels like her mood worsened while on statins and has never recovered since the summer 2023 but it is progressively worse. She has failed  to respond to multiple SSRIs and other psychiatric medications.    Out of work since 2010.    Option  clonidine We discussed the short-term risks associated with benzodiazepines including sedation and increased fall risk among others.  She reports better anxiety control with diazepam  as opposed to Xanax  intially until now.  She does not want to be prescribed Xanax  at any point in the future bc history of dissociation with it.  Discussed long-term side effect risk including dependence, potential withdrawal symptoms, and the potential eventual dose-related risk of dementia.  Advantage of clonazepam  which probably does have some mild mood stabilizing effects.   For energy motivation later option trial modafinil or Fetzima for focus and motivation.   Anxiety managed.  Option Viibryd for anxiety.  T4 tier and she doesn't think she can afford.  Only options are generic.  Reviewed [facility] results in detail previously.  Continue buspirone  15 mg 3 times daily,   mirtazapine  per her request at 22.5-45 mg HS.  SABRA  Better with Switch clonazepam  0.5 mg BID & 1mg  hS vs diazepam .  Continue Vitamin D   Continue doxazosin  8 mg HS for NM  No med changes  FU 3 mos  Lorene Macintosh, MD, DFAPA   Please see After Visit Summary for patient specific instructions.  Future Appointments  Date Time Provider Department Center  12/18/2023 10:00 AM [facility] PAT 2 [facility] None  12/19/2023 10:00 AM Sherlynn Sober, LCSW [facility] None  12/24/2023 10:00 AM [facility]  01/03/2024 10:00 AM Sherlynn Sober, LCSW [facility] None  01/14/2024 11:30 AM Kara Dorn NOVAK, MD [facility] None  01/17/2024 10:00 AM Sherlynn Sober, LCSW [facility] None  01/31/2024 10:00 AM Sherlynn Sober, LCSW [facility] None  02/14/2024 10:00 AM Sherlynn Sober, LCSW [facility] None  02/24/2024  8:45 AM Delford Maude BROCKS, MD [facility]    No orders of the defined types were placed in this encounter.     -------------------------------

## 2023-12-18 ENCOUNTER — Other Ambulatory Visit: Payer: Self-pay

## 2023-12-18 ENCOUNTER — Encounter (HOSPITAL_COMMUNITY)
Admission: RE | Admit: 2023-12-18 | Discharge: 2023-12-18 | Disposition: A | Discharge status: Home / Self Care | Source: Ambulatory Visit | Attending: Specialist | Admitting: Specialist

## 2023-12-18 ENCOUNTER — Encounter (HOSPITAL_COMMUNITY): Payer: Self-pay

## 2023-12-18 VITALS — BP 142/82 | HR 86 | Temp 98.2°F | Resp 16 | Ht 60.75 in | Wt 190.4 lb

## 2023-12-18 DIAGNOSIS — I2510 Atherosclerotic heart disease of native coronary artery without angina pectoris: Secondary | ICD-10-CM | POA: Diagnosis not present

## 2023-12-18 DIAGNOSIS — M1711 Unilateral primary osteoarthritis, right knee: Secondary | ICD-10-CM | POA: Diagnosis not present

## 2023-12-18 DIAGNOSIS — J4489 Other specified chronic obstructive pulmonary disease: Secondary | ICD-10-CM | POA: Diagnosis not present

## 2023-12-18 DIAGNOSIS — Z01818 Encounter for other preprocedural examination: Secondary | ICD-10-CM | POA: Diagnosis not present

## 2023-12-18 DIAGNOSIS — Z87891 Personal history of nicotine dependence: Secondary | ICD-10-CM | POA: Diagnosis not present

## 2023-12-18 DIAGNOSIS — I4510 Unspecified right bundle-branch block: Secondary | ICD-10-CM | POA: Insufficient documentation

## 2023-12-18 HISTORY — DX: Chronic obstructive pulmonary disease, unspecified: J44.9

## 2023-12-18 LAB — SURGICAL PCR SCREEN
MRSA, PCR: NEGATIVE
Staphylococcus aureus: NEGATIVE

## 2023-12-18 LAB — CBC
HCT: 47.2 % — ABNORMAL HIGH (ref 36.0–46.0)
Hemoglobin: 15.4 g/dL — ABNORMAL HIGH (ref 12.0–15.0)
MCH: 30.6 pg (ref 26.0–34.0)
MCHC: 32.6 g/dL (ref 30.0–36.0)
MCV: 93.7 fL (ref 80.0–100.0)
Platelets: 289 10*3/uL (ref 150–400)
RBC: 5.04 MIL/uL (ref 3.87–5.11)
RDW: 13.2 % (ref 11.5–15.5)
WBC: 4.8 10*3/uL (ref 4.0–10.5)
nRBC: 0 % (ref 0.0–0.2)

## 2023-12-18 LAB — BASIC METABOLIC PANEL WITH GFR
Anion gap: 10 (ref 5–15)
BUN: 12 mg/dL (ref 8–23)
CO2: 24 mmol/L (ref 22–32)
Calcium: 9.4 mg/dL (ref 8.9–10.3)
Chloride: 108 mmol/L (ref 98–111)
Creatinine, Ser: 0.74 mg/dL (ref 0.44–1.00)
GFR, Estimated: >60 mL/min (ref >60)
Glucose, Bld: 96 mg/dL (ref 70–99)
Potassium: 3.9 mmol/L (ref 3.5–5.1)
Sodium: 142 mmol/L (ref 135–145)

## 2023-12-19 ENCOUNTER — Ambulatory Visit: Admitting: Psychiatry

## 2023-12-19 DIAGNOSIS — F3162 Bipolar disorder, current episode mixed, moderate: Secondary | ICD-10-CM

## 2023-12-19 NOTE — Progress Notes (Signed)
 [facility] Counselor/Therapist Progress Note  Patient ID: Brittany [name], MRN: [id number],    Date: 12/19/2023  Time Spent: 53 minutes   Treatment Type: Individual Therapy  Reported Symptoms: anxiety, depression up some related to her upcoming surgery, motivation not quite as improved recently, some loneliness, sensitivity to perceived rejection, some trust issues, sensitivity to rejection due to my PTSD which I've lived with 42-45 yrs, relationship concerns, overthinking, anticipating knee surgery July 9th   Mental Status Exam:  Appearance:   Casual     Behavior:  Appropriate, Sharing, and Motivated  Motor:  Knee concerns and to have surgery next week.  Speech/Language:   Clear and Coherent  Affect:  Depressed and anxious  Mood:  anxious and depressed  Thought process:  goal directed  Thought content:    Rumination  Sensory/Perceptual disturbances:    WNL  Orientation:  oriented to person, place, time/date, situation, day of week, month of year, year, and stated date of December 19, 2023  Attention:  Fair  Concentration:  Fair  Memory:  WNL  Fund of knowledge:   Good  Insight:    Fair  Judgment:   Good and Fair  Impulse Control:  Good   Risk Assessment: Danger to Self:  No Self-injurious Behavior: No Danger to Others: No Duty to Warn:no Physical Aggression / Violence:No  Access to Firearms a concern: No  Gang Involvement:No   Subjective:  Patient today in session working issues including healthier boundaries, my bipolar, improving her motivation, focusing more on improving her self-esteem.  Concerned about upcoming knee surgery and needed to talk through these concerns and  worries. Letting go of negatives and doubts and grudges in the past and trying not to always be second-guessing people, looking for more positives versus negatives.  Looking at the positives including having several support people involved in helping her next week after surgery.  Patient working on  not jumping to conclusions and decreasing her overthinking, which is especially challenging right now understandably as she awaits knee replacement surgery.  States that she is trying to not overstress.  Continue to encourage patient and being patient with herself as she is working to change some behaviors that are working against her but have been going on for a long time.    Interventions: Cognitive Behavioral Therapy, Solution-Oriented/Positive Psychology, and Ego-Supportive 1) Long Term Goal: Develop and follow through on strategies to reduce symptoms.     Short Term Goal: Reduce anxiety and improve coping skills.     Objective: Learn two new ways of coping with daily stressors.     Objective: Develop strategies for thought distraction when fixating on the future.     Objective: Manage panic episodes. 2) Long Term Goal: Maintain stability of mood     Short Term Goal: Increase ability to manage moods.     Objective: Discuss and resolve troubling personal and interpersonal issues.    Diagnosis:   ICD-10-CM   1. Bipolar 1 disorder, mixed, moderate (HCC)  F31.62      Plan:    Patient working in session today on her self-esteem, bipolar, healthier boundaries and relationships, and improving motivation.  Needed session today to primarily focus on being more emotionally prepared for surgery next week.  Try not to automatically assume the negative and trying to think of her positives especially having multiple support people that will be available to her and checking in on her.  Overall she does sense some progress being made on  her therapy goals but right now it is hard for her to focus on a lot more other than her surgery next week understandably.  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 Encouraged patient in more goal-directed behaviors relating to being less negative in her thought patterns, practicing  better limit setting with people who are not healthy for her, recognize her overthinking more quickly and work to interrupt it, try reducing frequent thoughts of how things have to be in order to be okay, more regularly practice the pause, working on healthier interaction skills including boundaries, let her faith be a resource of emotional support and stability as well as spiritual, recognize her strengths and not just her challenges, understand that her anxious thoughts are just thoughts and are not necessarily going to play out in reality the way she might assume or fear.  Continues today with a greater sense of openness and ability to see some thoughts and behaviors that she is needing to work on without taking it so critically or personally.  Brittany [name] has made progress and needs to continue working with goal-directed behaviors to move in a more hopeful and healthier direction into her future.  Goal review and progress/challenges noted with patient.  Next appointment will likely be a telehealth appointment within 2 to 3 weeks due to her knee surgery next week.   Barnie Bunde, LCSW

## 2023-12-19 NOTE — Anesthesia Preprocedure Evaluation (Addendum)
 Anesthesia Evaluation  Patient identified by MRN, date of birth, ID band Patient awake    Reviewed: Allergy & Precautions, NPO status , Patient's Chart, lab work & pertinent test results  History of Anesthesia Complications Negative for: history of anesthetic complications  Airway Mallampati: III  TM Distance: >3 FB Neck ROM: Full    Dental  (+) Upper Dentures   Pulmonary COPD,  COPD inhaler, former smoker   Pulmonary exam normal        Cardiovascular + CAD  Normal cardiovascular exam  TTE 2021: EF 60-65%, mild LVH, mild RV dysfunction, valves ok     Neuro/Psych   Anxiety Depression Bipolar Disorder   negative neurological ROS     GI/Hepatic Neg liver ROS,GERD  Medicated,,  Endo/Other  negative endocrine ROS    Renal/GU negative Renal ROS     Musculoskeletal  (+) Arthritis ,    Abdominal   Peds  Hematology negative hematology ROS (+)   Anesthesia Other Findings Day of surgery medications reviewed with patient.  Reproductive/Obstetrics                              Anesthesia Physical Anesthesia Plan  ASA: 3  Anesthesia Plan: Spinal   Post-op Pain Management: Tylenol  PO (pre-op)* and Regional block*   Induction:   PONV Risk Score and Plan: 2 and Treatment may vary due to age or medical condition, Ondansetron , Propofol  infusion, Dexamethasone  and Midazolam   Airway Management Planned: Natural Airway and Simple Face Mask  Additional Equipment: None  Intra-op Plan:   Post-operative Plan:   Informed Consent: I have reviewed the patients History and Physical, chart, labs and discussed the procedure including the risks, benefits and alternatives for the proposed anesthesia with the patient or authorized representative who has indicated his/her understanding and acceptance.       Plan Discussed with: CRNA  Anesthesia Plan Comments:          Anesthesia Quick  Evaluation

## 2023-12-19 NOTE — Progress Notes (Addendum)
 Anesthesia Chart Review   Case: [id number] Date/Time: 12/25/23 0815   Procedure: ARTHROPLASTY, KNEE, TOTAL (Right: Knee)   Anesthesia type: Spinal   Diagnosis: Primary osteoarthritis of right knee [M17.11]   Pre-op diagnosis: Osteoarthrits right knee   Location: [facility] ROOM 10 / [facility]   Surgeons: Duwayne Purchase, MD       DISCUSSION:68 y.o. former smoker with h/o GERD, asthma, COPD, CAD, right knee OA scheduled for above procedure 12/25/2023 with Dr. Purchase Duwayne.   Per cardiology preoperative evaluation 10/30/2023, According to the Revised Cardiac Risk Index (RCRI), her Perioperative Risk of Major Cardiac Event is (%): 0.4. Her Functional Capacity in METs is: 6.05 according to the Duke Activity Status Index (DASI). Therefore, based on ACC/AHA guidelines, patient would be at acceptable risk for the planned procedure without further cardiovascular testing.  Clearance received from PCP, under media tab. Pt last seen by PCP 12/02/2023 for routine exam, stable at this visit. COPD, asthma stable at PAT visit, evaluate DOS.  VS: BP (!) 142/82   Pulse 86   Temp 36.8 C (Oral)   Resp 16   Ht 5' 0.75 (1.543 m)   Wt 86.4 kg   SpO2 98%   BMI 36.27 kg/m   PROVIDERS: Ozell Heron HERO, MD is PCP   Cardiologist - Maude Emmer, MD  Pulmonologist Dorn Chill, MD LABS: Labs reviewed: Acceptable for surgery. (all labs ordered are listed, but only abnormal results are displayed)  Labs Reviewed  CBC - Abnormal; Notable for the following components:      Result Value   Hemoglobin 15.4 (*)    HCT 47.2 (*)    All other components within normal limits  SURGICAL PCR SCREEN  BASIC METABOLIC PANEL WITH GFR     IMAGES:   EKG:   CV: Echo 01/26/2020  1. Left ventricular ejection fraction, by estimation, is 60 to 65%. The  left ventricle has normal function. The left ventricle has no regional  wall motion abnormalities. There is mild concentric left ventricular  hypertrophy and moderate  basal septal  hypertrophy.   2. Right ventricular systolic function is mildly reduced. The right  ventricular size is normal. There is normal pulmonary artery systolic  pressure.   3. The mitral valve is normal in structure. No evidence of mitral valve  regurgitation. No evidence of mitral stenosis.   4. The aortic valve is normal in structure. Aortic valve regurgitation is  not visualized. No aortic stenosis is present.   5. The inferior vena cava is normal in size with greater than 50%  respiratory variability, suggesting right atrial pressure of 3 mmHg.  Past Medical History:  Diagnosis Date   Abnormal EKG    Allergy    Anxiety    Arthritis    Asthma    Bipolar depression (HCC), Anxiety, PTSD, Panic disorder    -managed by [facility]   CAD (coronary artery disease)    COPD (chronic obstructive pulmonary disease) (HCC)    Depression    Foot pain    Gallstones    GERD (gastroesophageal reflux disease)    Heart murmur    as a child   HLD (hyperlipidemia)    Insomnia    Leg swelling    Obesity    Osteoporosis    osteopenia   Pneumonia due to COVID-19 virus 04/22/2019   Tachycardia     Past Surgical History:  Procedure Laterality Date   BREAST BIOPSY Right    biopsy of nipple   CARPAL TUNNEL  RELEASE Left    CESAREAN SECTION     x 2   CHOLECYSTECTOMY     COLONOSCOPY     ENDOMETRIAL ABLATION     EXCISION MORTON'S NEUROMA Left 01/11/2022   Procedure: SECOND WEBSPACE MORTON'S NEUROMA EXCISION;  Surgeon: Kit Rush, MD;  Location: [facility];  Service: Orthopedics;  Laterality: Left;   FOOT SURGERY Left    HAMMER TOE SURGERY Left 01/11/2022   Procedure: COLLATERAL LIGAMENT REPAIRS WITH SECOND AND THIRD HAMMER TOE CORRECTION;  Surgeon: Kit Rush, MD;  Location: [facility];  Service: Orthopedics;  Laterality: Left;   KNEE SURGERY Right    reconstruction for patellar dislocation   LAPAROSCOPY     x 2   TOENAIL EXCISION Left  01/11/2022   Procedure: HALLUX TOENAIL PERMANENT EXCISION;  Surgeon: Kit Rush, MD;  Location: [facility];  Service: Orthopedics;  Laterality: Left;   TUBAL LIGATION     ULNAR NERVE REPAIR Left    UPPER GASTROINTESTINAL ENDOSCOPY     WEIL OSTEOTOMY Left 01/11/2022   Procedure: SECOND, THIRD AND FOURTH WEIL OSTEOTOMIES;  Surgeon: Kit Rush, MD;  Location: [facility];  Service: Orthopedics;  Laterality: Left;    MEDICATIONS:  acetaminophen  (TYLENOL ) 500 MG tablet   albuterol  (PROVENTIL ) (2.5 MG/3ML) 0.083% nebulizer solution   Albuterol  Sulfate (PROAIR  RESPICLICK) 108 (90 Base) MCG/ACT AEPB   Ascorbic Acid  (VITA-C PO)   Azelastine  HCl 137 MCG/SPRAY SOLN   busPIRone  (BUSPAR ) 15 MG tablet   cetirizine  (ZYRTEC ) 10 MG tablet   Cholecalciferol (VITAMIN D -3 PO)   clonazePAM  (KLONOPIN ) 0.5 MG tablet   dicyclomine  (BENTYL ) 10 MG capsule   doxazosin  (CARDURA ) 4 MG tablet   Evolocumab  (REPATHA  SURECLICK) 140 MG/ML SOAJ   ezetimibe  (ZETIA ) 10 MG tablet   famotidine  (PEPCID ) 20 MG tablet   Ferrous Sulfate  (IRON PO)   fluticasone  (FLONASE ) 50 MCG/ACT nasal spray   fluticasone -salmeterol (ADVAIR) 250-50 MCG/ACT AEPB   ibuprofen (ADVIL) 200 MG tablet   MAGNESIUM  PO   mirtazapine  (REMERON ) 45 MG tablet   montelukast  (SINGULAIR ) 10 MG tablet   Multiple Vitamin (MULTIVITAMIN WITH MINERALS) TABS tablet   Omega-3 Fatty Acids (OMEGA 3 PO)   pantoprazole  (PROTONIX ) 40 MG tablet   Scar Treatment Products (SCAR EX)   sodium chloride  (OCEAN) 0.65 % SOLN nasal spray   Vitamin D -Vitamin K (D3 + K2 PO)   No current facility-administered medications for this encounter.     Harlene Hoots Ward, PA-C [facility] [phone]

## 2023-12-24 ENCOUNTER — Ambulatory Visit (HOSPITAL_BASED_OUTPATIENT_CLINIC_OR_DEPARTMENT_OTHER)
Admission: RE | Admit: 2023-12-24 | Discharge: 2023-12-24 | Disposition: A | Discharge status: Home / Self Care | Source: Ambulatory Visit | Attending: Family Medicine | Admitting: Family Medicine

## 2023-12-24 DIAGNOSIS — Z780 Asymptomatic menopausal state: Secondary | ICD-10-CM | POA: Insufficient documentation

## 2023-12-24 DIAGNOSIS — M8589 Other specified disorders of bone density and structure, multiple sites: Secondary | ICD-10-CM | POA: Diagnosis not present

## 2023-12-25 ENCOUNTER — Ambulatory Visit (HOSPITAL_BASED_OUTPATIENT_CLINIC_OR_DEPARTMENT_OTHER): Payer: Self-pay | Admitting: Certified Registered"

## 2023-12-25 ENCOUNTER — Encounter (HOSPITAL_COMMUNITY)
Admission: RE | Disposition: A | Discharge status: Home / Self Care | Payer: Self-pay | Source: Home / Self Care | Attending: Specialist

## 2023-12-25 ENCOUNTER — Other Ambulatory Visit: Payer: Self-pay

## 2023-12-25 ENCOUNTER — Ambulatory Visit (HOSPITAL_COMMUNITY): Payer: Self-pay | Admitting: Physician Assistant

## 2023-12-25 ENCOUNTER — Ambulatory Visit (HOSPITAL_COMMUNITY)

## 2023-12-25 ENCOUNTER — Encounter (HOSPITAL_COMMUNITY): Payer: Self-pay | Admitting: Specialist

## 2023-12-25 ENCOUNTER — Observation Stay (HOSPITAL_COMMUNITY)
Admission: RE | Admit: 2023-12-25 | Discharge: 2023-12-28 | Disposition: A | Discharge status: Home / Self Care | Attending: Specialist | Admitting: Specialist

## 2023-12-25 DIAGNOSIS — J45909 Unspecified asthma, uncomplicated: Secondary | ICD-10-CM | POA: Insufficient documentation

## 2023-12-25 DIAGNOSIS — G8918 Other acute postprocedural pain: Secondary | ICD-10-CM | POA: Diagnosis not present

## 2023-12-25 DIAGNOSIS — R739 Hyperglycemia, unspecified: Secondary | ICD-10-CM | POA: Diagnosis present

## 2023-12-25 DIAGNOSIS — F4312 Post-traumatic stress disorder, chronic: Secondary | ICD-10-CM | POA: Diagnosis present

## 2023-12-25 DIAGNOSIS — T84196A Other mechanical complication of internal fixation device of bone of right lower leg, initial encounter: Secondary | ICD-10-CM | POA: Diagnosis not present

## 2023-12-25 DIAGNOSIS — I2510 Atherosclerotic heart disease of native coronary artery without angina pectoris: Secondary | ICD-10-CM | POA: Diagnosis not present

## 2023-12-25 DIAGNOSIS — M1711 Unilateral primary osteoarthritis, right knee: Principal | ICD-10-CM | POA: Insufficient documentation

## 2023-12-25 DIAGNOSIS — Z87891 Personal history of nicotine dependence: Secondary | ICD-10-CM

## 2023-12-25 DIAGNOSIS — E66812 Obesity, class 2: Secondary | ICD-10-CM | POA: Diagnosis present

## 2023-12-25 DIAGNOSIS — M21161 Varus deformity, not elsewhere classified, right knee: Secondary | ICD-10-CM | POA: Diagnosis not present

## 2023-12-25 DIAGNOSIS — Z96651 Presence of right artificial knee joint: Secondary | ICD-10-CM | POA: Diagnosis not present

## 2023-12-25 DIAGNOSIS — Z79899 Other long term (current) drug therapy: Secondary | ICD-10-CM | POA: Diagnosis not present

## 2023-12-25 DIAGNOSIS — Z471 Aftercare following joint replacement surgery: Secondary | ICD-10-CM | POA: Diagnosis not present

## 2023-12-25 DIAGNOSIS — J449 Chronic obstructive pulmonary disease, unspecified: Secondary | ICD-10-CM

## 2023-12-25 DIAGNOSIS — D649 Anemia, unspecified: Secondary | ICD-10-CM | POA: Diagnosis present

## 2023-12-25 DIAGNOSIS — E785 Hyperlipidemia, unspecified: Secondary | ICD-10-CM | POA: Diagnosis present

## 2023-12-25 DIAGNOSIS — K219 Gastro-esophageal reflux disease without esophagitis: Secondary | ICD-10-CM | POA: Diagnosis present

## 2023-12-25 HISTORY — PX: TOTAL KNEE ARTHROPLASTY: SHX125

## 2023-12-25 SURGERY — ARTHROPLASTY, KNEE, TOTAL
Anesthesia: Spinal | Site: Knee | Laterality: Right

## 2023-12-25 MED ORDER — SALINE SPRAY 0.65 % NA SOLN
1.0000 | Freq: Two times a day (BID) | NASAL | Status: DC
  Administered 2023-12-25 – 2023-12-28 (×6): 1 via NASAL
  Filled 2023-12-25: qty 44

## 2023-12-25 MED ORDER — BUPIVACAINE LIPOSOME 1.3 % IJ SUSP
INTRAMUSCULAR | Status: AC
  Filled 2023-12-25: qty 20

## 2023-12-25 MED ORDER — DEXMEDETOMIDINE HCL IN NACL 80 MCG/20ML IV SOLN
INTRAVENOUS | Status: DC | PRN
  Administered 2023-12-25: 8 ug via INTRAVENOUS

## 2023-12-25 MED ORDER — KCL IN DEXTROSE-NACL 20-5-0.45 MEQ/L-%-% IV SOLN
INTRAVENOUS | Status: AC
  Filled 2023-12-25 (×2): qty 1000

## 2023-12-25 MED ORDER — LIDOCAINE HCL (PF) 2 % IJ SOLN
INTRAMUSCULAR | Status: AC
  Filled 2023-12-25: qty 5

## 2023-12-25 MED ORDER — PHENYLEPHRINE HCL-NACL 20-0.9 MG/250ML-% IV SOLN: INTRAVENOUS | Status: DC | PRN

## 2023-12-25 MED ORDER — METOCLOPRAMIDE HCL 5 MG PO TABS: 5.0000 mg | ORAL_TABLET | Freq: Three times a day (TID) | ORAL | Status: DC | PRN

## 2023-12-25 MED ORDER — CEFAZOLIN SODIUM-DEXTROSE 2-4 GM/100ML-% IV SOLN
2.0000 g | Freq: Four times a day (QID) | INTRAVENOUS | Status: AC
  Administered 2023-12-25 (×2): 2 g via INTRAVENOUS
  Filled 2023-12-25 (×2): qty 100

## 2023-12-25 MED ORDER — FERROUS SULFATE 325 (65 FE) MG PO TABS
325.0000 mg | ORAL_TABLET | Freq: Every day | ORAL | Status: DC
  Administered 2023-12-25 – 2023-12-28 (×4): 325 mg via ORAL
  Filled 2023-12-25 (×4): qty 1

## 2023-12-25 MED ORDER — DEXAMETHASONE SODIUM PHOSPHATE 10 MG/ML IJ SOLN
INTRAMUSCULAR | Status: DC | PRN
Start: 2023-12-25 — End: 2023-12-25
  Administered 2023-12-25: 10 mg via INTRAVENOUS

## 2023-12-25 MED ORDER — EPHEDRINE 5 MG/ML INJ
INTRAVENOUS | Status: AC
  Filled 2023-12-25: qty 5

## 2023-12-25 MED ORDER — BUSPIRONE HCL 5 MG PO TABS
15.0000 mg | ORAL_TABLET | Freq: Three times a day (TID) | ORAL | Status: DC
  Administered 2023-12-25 – 2023-12-28 (×9): 15 mg via ORAL
  Filled 2023-12-25 (×9): qty 1

## 2023-12-25 MED ORDER — EZETIMIBE 10 MG PO TABS
10.0000 mg | ORAL_TABLET | Freq: Every day | ORAL | Status: DC
  Administered 2023-12-26 – 2023-12-28 (×3): 10 mg via ORAL
  Filled 2023-12-25 (×3): qty 1

## 2023-12-25 MED ORDER — ONDANSETRON HCL 4 MG/2ML IJ SOLN
4.0000 mg | Freq: Four times a day (QID) | INTRAMUSCULAR | Status: DC | PRN
  Administered 2023-12-26 – 2023-12-27 (×2): 4 mg via INTRAVENOUS
  Filled 2023-12-25 (×2): qty 2

## 2023-12-25 MED ORDER — METHOCARBAMOL 1000 MG/10ML IJ SOLN: 500.0000 mg | Freq: Four times a day (QID) | INTRAMUSCULAR | Status: DC | PRN

## 2023-12-25 MED ORDER — TRANEXAMIC ACID-NACL 1000-0.7 MG/100ML-% IV SOLN
1000.0000 mg | INTRAVENOUS | Status: AC
  Administered 2023-12-25: 1000 mg via INTRAVENOUS
  Filled 2023-12-25: qty 100

## 2023-12-25 MED ORDER — HYDROMORPHONE HCL 1 MG/ML IJ SOLN
0.5000 mg | INTRAMUSCULAR | Status: DC | PRN
  Administered 2023-12-26: 1 mg via INTRAVENOUS
  Filled 2023-12-25: qty 1

## 2023-12-25 MED ORDER — PROPOFOL 500 MG/50ML IV EMUL
INTRAVENOUS | Status: DC | PRN
  Administered 2023-12-25: 75 ug/kg/min via INTRAVENOUS

## 2023-12-25 MED ORDER — FENTANYL CITRATE (PF) 100 MCG/2ML IJ SOLN
INTRAMUSCULAR | Status: AC
  Filled 2023-12-25: qty 2

## 2023-12-25 MED ORDER — SODIUM CHLORIDE 0.9 % IR SOLN
Status: DC | PRN
  Administered 2023-12-25: 1000 mL
  Administered 2023-12-25: 250 mL

## 2023-12-25 MED ORDER — ACETAMINOPHEN 500 MG PO TABS: 1000.0000 mg | ORAL_TABLET | Freq: Once | ORAL | Status: DC

## 2023-12-25 MED ORDER — MIDAZOLAM HCL 2 MG/2ML IJ SOLN
1.0000 mg | INTRAMUSCULAR | Status: AC
  Administered 2023-12-25: 2 mg via INTRAVENOUS

## 2023-12-25 MED ORDER — ONDANSETRON HCL 4 MG/2ML IJ SOLN
INTRAMUSCULAR | Status: AC
Start: 2023-12-25 — End: 2023-12-25
  Filled 2023-12-25: qty 2

## 2023-12-25 MED ORDER — LORATADINE 10 MG PO TABS
10.0000 mg | ORAL_TABLET | Freq: Every day | ORAL | Status: DC
  Administered 2023-12-26 – 2023-12-28 (×2): 10 mg via ORAL
  Filled 2023-12-25 (×2): qty 1

## 2023-12-25 MED ORDER — ALUM & MAG HYDROXIDE-SIMETH 200-200-20 MG/5ML PO SUSP: 30.0000 mL | ORAL | Status: DC | PRN

## 2023-12-25 MED ORDER — ACETAMINOPHEN 10 MG/ML IV SOLN
1000.0000 mg | INTRAVENOUS | Status: AC
  Administered 2023-12-25: 1000 mg via INTRAVENOUS
  Filled 2023-12-25: qty 100

## 2023-12-25 MED ORDER — PROPOFOL 10 MG/ML IV BOLUS
INTRAVENOUS | Status: AC
  Filled 2023-12-25: qty 20

## 2023-12-25 MED ORDER — APIXABAN 2.5 MG PO TABS
2.5000 mg | ORAL_TABLET | Freq: Two times a day (BID) | ORAL | Status: DC
  Administered 2023-12-26 – 2023-12-27 (×3): 2.5 mg via ORAL
  Filled 2023-12-25 (×3): qty 1

## 2023-12-25 MED ORDER — PHENYLEPHRINE HCL-NACL 20-0.9 MG/250ML-% IV SOLN
INTRAVENOUS | Status: DC | PRN
  Administered 2023-12-25: 10 ug/min via INTRAVENOUS

## 2023-12-25 MED ORDER — LACTATED RINGERS IV SOLN: INTRAVENOUS | Status: DC

## 2023-12-25 MED ORDER — KCL IN DEXTROSE-NACL 20-5-0.9 MEQ/L-%-% IV SOLN
INTRAVENOUS | Status: DC
  Filled 2023-12-25: qty 1000

## 2023-12-25 MED ORDER — MAGNESIUM OXIDE -MG SUPPLEMENT 400 (240 MG) MG PO TABS
400.0000 mg | ORAL_TABLET | Freq: Every day | ORAL | Status: DC
  Administered 2023-12-26 – 2023-12-28 (×3): 400 mg via ORAL
  Filled 2023-12-25 (×3): qty 1

## 2023-12-25 MED ORDER — BUPIVACAINE-EPINEPHRINE (PF) 0.25% -1:200000 IJ SOLN
INTRAMUSCULAR | Status: AC
  Filled 2023-12-25: qty 30

## 2023-12-25 MED ORDER — BUPIVACAINE-EPINEPHRINE (PF) 0.5% -1:200000 IJ SOLN
INTRAMUSCULAR | Status: DC | PRN
  Administered 2023-12-25: 15 mL via PERINEURAL

## 2023-12-25 MED ORDER — FLUTICASONE PROPIONATE 50 MCG/ACT NA SUSP
2.0000 | Freq: Two times a day (BID) | NASAL | Status: DC
  Administered 2023-12-25 – 2023-12-28 (×6): 2 via NASAL
  Filled 2023-12-25: qty 16

## 2023-12-25 MED ORDER — PRONTOSAN WOUND IRRIGATION OPTIME
TOPICAL | Status: DC | PRN
  Administered 2023-12-25: 1 via TOPICAL

## 2023-12-25 MED ORDER — METHOCARBAMOL 500 MG PO TABS
500.0000 mg | ORAL_TABLET | Freq: Four times a day (QID) | ORAL | Status: DC | PRN
  Administered 2023-12-25 – 2023-12-26 (×5): 500 mg via ORAL
  Filled 2023-12-25 (×3): qty 1

## 2023-12-25 MED ORDER — LIDOCAINE HCL (CARDIAC) PF 100 MG/5ML IV SOSY
PREFILLED_SYRINGE | INTRAVENOUS | Status: DC | PRN
  Administered 2023-12-25: 40 mg via INTRAVENOUS

## 2023-12-25 MED ORDER — METHOCARBAMOL 500 MG PO TABS
ORAL_TABLET | ORAL | Status: AC
  Filled 2023-12-25: qty 1

## 2023-12-25 MED ORDER — HYDROMORPHONE HCL 1 MG/ML IJ SOLN
INTRAMUSCULAR | Status: DC | PRN
  Administered 2023-12-25: 1 mg via INTRAVENOUS

## 2023-12-25 MED ORDER — FLUTICASONE FUROATE-VILANTEROL 200-25 MCG/ACT IN AEPB
1.0000 | INHALATION_SPRAY | Freq: Two times a day (BID) | RESPIRATORY_TRACT | Status: DC
  Administered 2023-12-25 – 2023-12-28 (×6): 1 via RESPIRATORY_TRACT
  Filled 2023-12-25: qty 28

## 2023-12-25 MED ORDER — BISACODYL 5 MG PO TBEC
5.0000 mg | DELAYED_RELEASE_TABLET | Freq: Every day | ORAL | Status: DC | PRN
Start: 2023-12-25 — End: 2023-12-28

## 2023-12-25 MED ORDER — PHENYLEPHRINE HCL (PRESSORS) 10 MG/ML IV SOLN
INTRAVENOUS | Status: AC
  Filled 2023-12-25: qty 1

## 2023-12-25 MED ORDER — GLYCOPYRROLATE 0.2 MG/ML IJ SOLN
INTRAMUSCULAR | Status: AC
  Filled 2023-12-25: qty 1

## 2023-12-25 MED ORDER — FENTANYL CITRATE PF 50 MCG/ML IJ SOSY
PREFILLED_SYRINGE | INTRAMUSCULAR | Status: AC
  Filled 2023-12-25: qty 2

## 2023-12-25 MED ORDER — OXYCODONE HCL 5 MG PO TABS
5.0000 mg | ORAL_TABLET | Freq: Once | ORAL | Status: AC | PRN
  Administered 2023-12-25: 5 mg via ORAL

## 2023-12-25 MED ORDER — RISAQUAD PO CAPS
1.0000 | ORAL_CAPSULE | Freq: Every day | ORAL | Status: DC
  Administered 2023-12-27 – 2023-12-28 (×2): 1 via ORAL
  Filled 2023-12-25 (×4): qty 1

## 2023-12-25 MED ORDER — SODIUM CHLORIDE (PF) 0.9 % IJ SOLN
INTRAMUSCULAR | Status: AC
  Filled 2023-12-25: qty 50

## 2023-12-25 MED ORDER — MIDAZOLAM HCL 2 MG/2ML IJ SOLN
INTRAMUSCULAR | Status: AC
  Filled 2023-12-25: qty 2

## 2023-12-25 MED ORDER — DOXAZOSIN MESYLATE 4 MG PO TABS
8.0000 mg | ORAL_TABLET | Freq: Every day | ORAL | Status: DC
  Administered 2023-12-25 – 2023-12-27 (×3): 8 mg via ORAL
  Filled 2023-12-25 (×3): qty 2

## 2023-12-25 MED ORDER — DROPERIDOL 2.5 MG/ML IJ SOLN: 0.6250 mg | Freq: Once | INTRAMUSCULAR | Status: DC | PRN

## 2023-12-25 MED ORDER — PANTOPRAZOLE SODIUM 40 MG PO TBEC
40.0000 mg | DELAYED_RELEASE_TABLET | Freq: Two times a day (BID) | ORAL | Status: DC
  Administered 2023-12-25 – 2023-12-26 (×2): 40 mg via ORAL
  Filled 2023-12-25 (×2): qty 1

## 2023-12-25 MED ORDER — DOCUSATE SODIUM 100 MG PO CAPS
100.0000 mg | ORAL_CAPSULE | Freq: Two times a day (BID) | ORAL | Status: DC
  Administered 2023-12-28: 100 mg via ORAL
  Filled 2023-12-25 (×5): qty 1

## 2023-12-25 MED ORDER — OXYCODONE HCL 5 MG/5ML PO SOLN: 5.0000 mg | Freq: Once | ORAL | Status: AC | PRN

## 2023-12-25 MED ORDER — CEFAZOLIN SODIUM-DEXTROSE 2-4 GM/100ML-% IV SOLN
2.0000 g | INTRAVENOUS | Status: AC
  Administered 2023-12-25: 2 g via INTRAVENOUS
  Filled 2023-12-25: qty 100

## 2023-12-25 MED ORDER — EPHEDRINE SULFATE (PRESSORS) 50 MG/ML IJ SOLN
INTRAMUSCULAR | Status: DC | PRN
  Administered 2023-12-25: 5 mg via INTRAVENOUS

## 2023-12-25 MED ORDER — DIPHENHYDRAMINE HCL 12.5 MG/5ML PO ELIX
12.5000 mg | ORAL_SOLUTION | ORAL | Status: DC | PRN
  Administered 2023-12-26: 25 mg via ORAL
  Filled 2023-12-25: qty 10

## 2023-12-25 MED ORDER — HYDROMORPHONE HCL 2 MG PO TABS
2.0000 mg | ORAL_TABLET | ORAL | Status: DC | PRN
  Administered 2023-12-25 (×2): 2 mg via ORAL
  Administered 2023-12-25 – 2023-12-26 (×2): 3 mg via ORAL
  Administered 2023-12-26: 2 mg via ORAL
  Administered 2023-12-26: 3 mg via ORAL
  Administered 2023-12-27 – 2023-12-28 (×2): 2 mg via ORAL
  Filled 2023-12-25 (×2): qty 1
  Filled 2023-12-25 (×2): qty 2
  Filled 2023-12-25: qty 1
  Filled 2023-12-25: qty 2
  Filled 2023-12-25: qty 1
  Filled 2023-12-25: qty 2

## 2023-12-25 MED ORDER — FENTANYL CITRATE PF 50 MCG/ML IJ SOSY
25.0000 ug | PREFILLED_SYRINGE | INTRAMUSCULAR | Status: DC | PRN
  Administered 2023-12-25 (×2): 50 ug via INTRAVENOUS

## 2023-12-25 MED ORDER — MAGNESIUM CITRATE PO SOLN: 1.0000 | Freq: Once | ORAL | Status: DC | PRN

## 2023-12-25 MED ORDER — HYDROMORPHONE HCL 2 MG PO TABS
1.0000 mg | ORAL_TABLET | ORAL | Status: DC | PRN
  Administered 2023-12-26 – 2023-12-27 (×2): 2 mg via ORAL
  Filled 2023-12-25 (×2): qty 1

## 2023-12-25 MED ORDER — BUPIVACAINE IN DEXTROSE 0.75-8.25 % IT SOLN
INTRATHECAL | Status: DC | PRN
  Administered 2023-12-25: 1.6 mL via INTRATHECAL

## 2023-12-25 MED ORDER — ADULT MULTIVITAMIN W/MINERALS CH
1.0000 | ORAL_TABLET | Freq: Every day | ORAL | Status: DC
  Administered 2023-12-26 – 2023-12-28 (×3): 1 via ORAL
  Filled 2023-12-25 (×3): qty 1

## 2023-12-25 MED ORDER — PROPOFOL 10 MG/ML IV BOLUS
INTRAVENOUS | Status: DC | PRN
  Administered 2023-12-25 (×2): 20 mg via INTRAVENOUS

## 2023-12-25 MED ORDER — POLYETHYLENE GLYCOL 3350 17 G PO PACK: 17.0000 g | PACK | Freq: Every day | ORAL | Status: DC | PRN

## 2023-12-25 MED ORDER — ACETAMINOPHEN 500 MG PO TABS
1000.0000 mg | ORAL_TABLET | Freq: Four times a day (QID) | ORAL | Status: AC
  Administered 2023-12-25 – 2023-12-26 (×3): 1000 mg via ORAL
  Filled 2023-12-25 (×4): qty 2

## 2023-12-25 MED ORDER — BUPIVACAINE-EPINEPHRINE (PF) 0.25% -1:200000 IJ SOLN
INTRAMUSCULAR | Status: DC | PRN
  Administered 2023-12-25: 30 mL

## 2023-12-25 MED ORDER — MENTHOL 3 MG MT LOZG: 1.0000 | LOZENGE | OROMUCOSAL | Status: DC | PRN

## 2023-12-25 MED ORDER — ONDANSETRON HCL 4 MG/2ML IJ SOLN
INTRAMUSCULAR | Status: DC | PRN
  Administered 2023-12-25: 4 mg via INTRAVENOUS

## 2023-12-25 MED ORDER — ORAL CARE MOUTH RINSE: 15.0000 mL | Freq: Once | OROMUCOSAL | Status: AC

## 2023-12-25 MED ORDER — SODIUM CHLORIDE 0.9 % IV SOLN
INTRAVENOUS | Status: DC | PRN
  Administered 2023-12-25: 60 mL

## 2023-12-25 MED ORDER — FAMOTIDINE 20 MG PO TABS
20.0000 mg | ORAL_TABLET | Freq: Every day | ORAL | Status: DC
  Administered 2023-12-25: 20 mg via ORAL
  Filled 2023-12-25: qty 1

## 2023-12-25 MED ORDER — CLONIDINE HCL (ANALGESIA) 100 MCG/ML EP SOLN
EPIDURAL | Status: DC | PRN
  Administered 2023-12-25: 100 ug

## 2023-12-25 MED ORDER — D3 + K2 125-100 MCG PO CAPS: ORAL_CAPSULE | Freq: Every day | ORAL | Status: DC

## 2023-12-25 MED ORDER — HYDROMORPHONE HCL 2 MG/ML IJ SOLN
INTRAMUSCULAR | Status: AC
  Filled 2023-12-25: qty 1

## 2023-12-25 MED ORDER — MIRTAZAPINE 15 MG PO TABS
45.0000 mg | ORAL_TABLET | Freq: Every day | ORAL | Status: DC
Start: 2023-12-25 — End: 2023-12-28
  Administered 2023-12-25 – 2023-12-27 (×3): 45 mg via ORAL
  Filled 2023-12-25 (×4): qty 3

## 2023-12-25 MED ORDER — ASCORBIC ACID PO CRYS: CRYSTALS | Freq: Every day | ORAL | Status: DC

## 2023-12-25 MED ORDER — MONTELUKAST SODIUM 10 MG PO TABS
10.0000 mg | ORAL_TABLET | Freq: Every day | ORAL | Status: DC
  Administered 2023-12-25 – 2023-12-27 (×3): 10 mg via ORAL
  Filled 2023-12-25 (×3): qty 1

## 2023-12-25 MED ORDER — ONDANSETRON HCL 4 MG PO TABS: 4.0000 mg | ORAL_TABLET | Freq: Four times a day (QID) | ORAL | Status: DC | PRN

## 2023-12-25 MED ORDER — CLONAZEPAM 0.5 MG PO TABS
0.5000 mg | ORAL_TABLET | Freq: Three times a day (TID) | ORAL | Status: DC
  Administered 2023-12-25 – 2023-12-28 (×9): 0.5 mg via ORAL
  Filled 2023-12-25 (×9): qty 1

## 2023-12-25 MED ORDER — DICYCLOMINE HCL 10 MG PO CAPS: 10.0000 mg | ORAL_CAPSULE | ORAL | Status: DC | PRN

## 2023-12-25 MED ORDER — FENTANYL CITRATE (PF) 100 MCG/2ML IJ SOLN
INTRAMUSCULAR | Status: DC | PRN
  Administered 2023-12-25: 25 ug via INTRAVENOUS
  Administered 2023-12-25 (×2): 50 ug via INTRAVENOUS

## 2023-12-25 MED ORDER — ACETAMINOPHEN 325 MG PO TABS: 325.0000 mg | ORAL_TABLET | Freq: Four times a day (QID) | ORAL | Status: DC | PRN

## 2023-12-25 MED ORDER — AZELASTINE HCL 0.1 % NA SOLN
1.0000 | Freq: Two times a day (BID) | NASAL | Status: DC
  Administered 2023-12-25 – 2023-12-28 (×6): 1 via NASAL
  Filled 2023-12-25: qty 30

## 2023-12-25 MED ORDER — ALBUTEROL SULFATE 108 (90 BASE) MCG/ACT IN AEPB: 2.0000 | INHALATION_SPRAY | Freq: Four times a day (QID) | RESPIRATORY_TRACT | Status: DC | PRN

## 2023-12-25 MED ORDER — 0.9 % SODIUM CHLORIDE (POUR BTL) OPTIME
TOPICAL | Status: DC | PRN
  Administered 2023-12-25: 1000 mL

## 2023-12-25 MED ORDER — PHENYLEPHRINE 80 MCG/ML (10ML) SYRINGE FOR IV PUSH (FOR BLOOD PRESSURE SUPPORT)
PREFILLED_SYRINGE | INTRAVENOUS | Status: AC
  Filled 2023-12-25: qty 10

## 2023-12-25 MED ORDER — PHENOL 1.4 % MT LIQD: 1.0000 | OROMUCOSAL | Status: DC | PRN

## 2023-12-25 MED ORDER — DEXAMETHASONE SODIUM PHOSPHATE 10 MG/ML IJ SOLN
INTRAMUSCULAR | Status: AC
  Filled 2023-12-25: qty 1

## 2023-12-25 MED ORDER — OXYCODONE HCL 5 MG PO TABS
ORAL_TABLET | ORAL | Status: AC
  Filled 2023-12-25: qty 1

## 2023-12-25 MED ORDER — ALBUTEROL SULFATE (2.5 MG/3ML) 0.083% IN NEBU: 2.5000 mg | INHALATION_SOLUTION | Freq: Four times a day (QID) | RESPIRATORY_TRACT | Status: DC | PRN

## 2023-12-25 MED ORDER — SODIUM CHLORIDE 0.9 % IR SOLN: Status: DC | PRN

## 2023-12-25 MED ORDER — METOCLOPRAMIDE HCL 5 MG/ML IJ SOLN: 5.0000 mg | Freq: Three times a day (TID) | INTRAMUSCULAR | Status: DC | PRN

## 2023-12-25 MED ORDER — PROPOFOL 1000 MG/100ML IV EMUL
INTRAVENOUS | Status: AC
  Filled 2023-12-25: qty 100

## 2023-12-25 MED ORDER — CHLORHEXIDINE GLUCONATE 0.12 % MT SOLN
15.0000 mL | Freq: Once | OROMUCOSAL | Status: AC
  Administered 2023-12-25: 15 mL via OROMUCOSAL

## 2023-12-25 MED ORDER — FENTANYL CITRATE PF 50 MCG/ML IJ SOSY: 50.0000 ug | PREFILLED_SYRINGE | INTRAMUSCULAR | Status: DC

## 2023-12-25 SURGICAL SUPPLY — 56 items
ATTUNE PS FEM RT SZ 5 CEM KNEE (Femur) IMPLANT
ATTUNE PSRP INSR SZ5 6 KNEE (Insert) IMPLANT
BAG COUNTER SPONGE SURGICOUNT (BAG) IMPLANT
BAG ZIPLOCK 12X15 (MISCELLANEOUS) IMPLANT
BASEPLATE TIBIAL ROTATING SZ 4 (Knees) IMPLANT
BLADE SAW SGTL 11.0X1.19X90.0M (BLADE) ×2 IMPLANT
BLADE SAW SGTL 13.0X1.19X90.0M (BLADE) ×2 IMPLANT
BLADE SURG SZ10 CARB STEEL (BLADE) ×4 IMPLANT
BNDG ELASTIC 4INX 5YD STR LF (GAUZE/BANDAGES/DRESSINGS) ×2 IMPLANT
BNDG ELASTIC 6INX 5YD STR LF (GAUZE/BANDAGES/DRESSINGS) ×2 IMPLANT
BOWL SMART MIX CTS (DISPOSABLE) ×2 IMPLANT
CEMENT HV SMART SET (Cement) ×4 IMPLANT
COVER SURGICAL LIGHT HANDLE (MISCELLANEOUS) ×2 IMPLANT
CUFF TRNQT CYL 34X4.125X (TOURNIQUET CUFF) ×2 IMPLANT
DRAPE INCISE IOBAN 66X45 STRL (DRAPES) IMPLANT
DRAPE SHEET LG 3/4 BI-LAMINATE (DRAPES) ×2 IMPLANT
DRAPE SURG ORHT 6 SPLT 77X108 (DRAPES) ×4 IMPLANT
DRAPE TOP 10253 STERILE (DRAPES) ×2 IMPLANT
DRAPE U-SHAPE 47X51 STRL (DRAPES) ×2 IMPLANT
DRSG AQUACEL AG ADV 3.5X10 (GAUZE/BANDAGES/DRESSINGS) ×2 IMPLANT
DURAPREP 26ML APPLICATOR (WOUND CARE) ×2 IMPLANT
ELECT BLADE TIP CTD 4 INCH (ELECTRODE) ×2 IMPLANT
ELECT PENCIL ROCKER SW 15FT (MISCELLANEOUS) ×2 IMPLANT
ELECT REM PT RETURN 15FT ADLT (MISCELLANEOUS) ×2 IMPLANT
GLOVE BIOGEL PI IND STRL 7.0 (GLOVE) ×2 IMPLANT
GLOVE BIOGEL PI IND STRL 8 (GLOVE) ×2 IMPLANT
GLOVE SURG SS PI 7.0 STRL IVOR (GLOVE) ×2 IMPLANT
GLOVE SURG SS PI 8.0 STRL IVOR (GLOVE) ×4 IMPLANT
GOWN STRL REUS W/ TWL XL LVL3 (GOWN DISPOSABLE) ×4 IMPLANT
HEMOSTAT SPONGE AVITENE ULTRA (HEMOSTASIS) IMPLANT
HOLDER FOLEY CATH W/STRAP (MISCELLANEOUS) ×2 IMPLANT
IMMOBILIZER KNEE 20 THIGH 36 (SOFTGOODS) ×2 IMPLANT
KIT TURNOVER KIT A (KITS) ×2 IMPLANT
MANIFOLD NEPTUNE II (INSTRUMENTS) ×2 IMPLANT
NS IRRIG 1000ML POUR BTL (IV SOLUTION) IMPLANT
PACK TOTAL KNEE CUSTOM (KITS) ×2 IMPLANT
PATELLA MEDIAL ATTUN 35MM KNEE (Knees) IMPLANT
PIN STEINMAN FIXATION KNEE (PIN) IMPLANT
PROTECTOR NERVE ULNAR (MISCELLANEOUS) ×2 IMPLANT
SEALER BIPOLAR AQUA 6.0 (INSTRUMENTS) IMPLANT
SET HNDPC FAN SPRY TIP SCT (DISPOSABLE) ×2 IMPLANT
SOLUTION PRONTOSAN WOUND 350ML (IRRIGATION / IRRIGATOR) ×2 IMPLANT
SPIKE FLUID TRANSFER (MISCELLANEOUS) ×2 IMPLANT
STAPLER VISISTAT (STAPLE) IMPLANT
STRIP CLOSURE SKIN 1/2X4 (GAUZE/BANDAGES/DRESSINGS) IMPLANT
SUT BONE WAX W31G (SUTURE) ×2 IMPLANT
SUT MNCRL AB 4-0 PS2 18 (SUTURE) IMPLANT
SUT VIC AB 1 CT1 27XBRD ANTBC (SUTURE) ×6 IMPLANT
SUT VIC AB 2-0 CT1 TAPERPNT 27 (SUTURE) ×6 IMPLANT
SUTURE STRATFX 0 PDS 27 VIOLET (SUTURE) ×2 IMPLANT
TRAY FOLEY MTR SLVR 14FR STAT (SET/KITS/TRAYS/PACK) IMPLANT
TRAY FOLEY MTR SLVR 16FR STAT (SET/KITS/TRAYS/PACK) ×2 IMPLANT
TUBE SUCTION HIGH CAP CLEAR NV (SUCTIONS) ×2 IMPLANT
WATER STERILE IRR 1000ML POUR (IV SOLUTION) ×2 IMPLANT
WIPE CHG 2% PREP (PERSONAL CARE ITEMS) ×2 IMPLANT
WRAP KNEE MAXI GEL POST OP (GAUZE/BANDAGES/DRESSINGS) ×2 IMPLANT

## 2023-12-25 NOTE — Anesthesia Procedure Notes (Signed)
 Anesthesia Regional Block: Adductor canal block   Pre-Anesthetic Checklist: , timeout performed,  Correct Patient, Correct Site, Correct Laterality,  Correct Procedure, Correct Position, site marked,  Risks and benefits discussed,  Pre-op evaluation,  At surgeon's request and post-op pain management  Laterality: Right  Prep: Maximum Sterile Barrier Precautions used, chloraprep       Needles:  Injection technique: Single-shot  Needle Type: Echogenic Stimulator Needle     Needle Length: 9cm  Needle Gauge: 22     Additional Needles:   Procedures:,,,, ultrasound used (permanent image in chart),,    Narrative:  Start time: 12/25/2023 7:50 AM End time: 12/25/2023 7:53 AM Injection made incrementally with aspirations every 5 mL.  Performed by: Personally  Anesthesiologist: Paul Lamarr BRAVO, MD  Additional Notes: Risks, benefits, and alternative discussed. Patient gave consent for procedure. Patient prepped and draped in sterile fashion. Sedation administered, patient remains easily responsive to voice. Relevant anatomy identified with ultrasound guidance. Local anesthetic given in 5cc increments with no signs or symptoms of intravascular injection. No pain or paraesthesias with injection. Patient monitored throughout procedure with signs of LAST or immediate complications. Tolerated well. Ultrasound image placed in chart.  LANEY Paul, MD

## 2023-12-25 NOTE — Care Plan (Signed)
 Ortho Bundle Case Management Note  Patient Details  Name: Brittany [name] MRN: [id number] Date of Birth: [birth year]  R TKA on 12/25/23  DCP: Home with granddaughter DME: RW, ordered through [employer] PT: [facility]                        DME Arranged:  Walker rolling DME Agency:  [employer]  HH Arranged:  PT HH Agency:  [facility]  Additional Comments: Please contact me with any questions of if this plan should need to change.  Lyle Pepper, CCM  [facility] [phone]   12/25/2023, 8:17 AM

## 2023-12-25 NOTE — Brief Op Note (Addendum)
 12/25/2023  8:16 AM  PATIENT:  Brittany [name]  68 y.o. female  PRE-OPERATIVE DIAGNOSIS:  Osteoarthrits right knee  POST-OPERATIVE DIAGNOSIS:  same  The aquamantis was utilized for this case to help facilitate better hemostasis as patient was felt to be at increased risk of bleeding because of recent anticoagulation use and complex case requiring increased OR time and/or exposure.   PROCEDURE:  Procedure(s): ARTHROPLASTY, KNEE, TOTAL (Right)  SURGEON:  Surgeons and Role:    * Duwayne Purchase, MD - Primary  PHYSICIAN ASSISTANT:   ASSISTANTS: Bissell   ANESTHESIA:   spinal  EBL:  50   BLOOD ADMINISTERED:none  DRAINS: none   LOCAL MEDICATIONS USED:  MARCAINE      SPECIMEN:  No Specimen  DISPOSITION OF SPECIMEN:  N/A  COUNTS:  YES  TOURNIQUET: 57 min  DICTATION: .Other Dictation: Dictation Number [id number]  PLAN OF CARE: Admit for overnight observation  PATIENT DISPOSITION:  PACU - hemodynamically stable.   Delay start of Pharmacological VTE agent (>24hrs) due to surgical blood loss or risk of bleeding: no

## 2023-12-25 NOTE — Discharge Instructions (Addendum)
 Elevate leg above heart 6x a day for each Use knee immobilizer while walking until can SLR x 10 Use knee immobilizer in bed to keep knee in extension Aquacel dressing may remain in place for one week post-op, they remove. May shower with aquacel dressing in place. If the dressing becomes saturated or peels off, you may remove aquacel dressing. Do not remove steri-strips if they are present. Place new dressing with gauze and tape or ACE bandage which should be kept clean and dry and changed daily.  INSTRUCTIONS AFTER JOINT REPLACEMENT   Remove items at home which could result in a fall. This includes throw rugs or furniture in walking pathways ICE to the affected joint every three hours while awake for 30 minutes at a time, for at least the first 3-5 days, and then as needed for pain and swelling.  Continue to use ice for pain and swelling. You may notice swelling that will progress down to the foot and ankle.  This is normal after surgery.  Elevate your leg when you are not up walking on it.   Continue to use the breathing machine you got in the hospital (incentive spirometer) which will help keep your temperature down.  It is common for your temperature to cycle up and down following surgery, especially at night when you are not up moving around and exerting yourself.  The breathing machine keeps your lungs expanded and your temperature down.   DIET:  As you were doing prior to hospitalization, we recommend a well-balanced diet.  DRESSING / WOUND CARE / SHOWERING  Keep the surgical dressing for one week post-op.  The dressing is water proof, so you can shower without any extra covering.  IF THE DRESSING FALLS OFF or the wound gets wet inside prior to one week post-op, or at one week post-op, change the dressing with sterile gauze.  Please use good hand washing techniques before changing the dressing.  Do not use any lotions or creams on the incision until instructed by your surgeon.     ACTIVITY  Increase activity slowly as tolerated, but follow the weight bearing instructions below.   No driving for 6 weeks or until further direction given by your physician.  You cannot drive while taking narcotics.  No lifting or carrying greater than 10 lbs. until further directed by your surgeon. Avoid periods of inactivity such as sitting longer than an hour when not asleep. This helps prevent blood clots.  You may return to work once you are authorized by your doctor.     WEIGHT BEARING   Weight bearing as tolerated with assist device (walker, cane, etc) as directed, use it as long as suggested by your surgeon or therapist, typically at least 4-6 weeks.   EXERCISES  Results after joint replacement surgery are often greatly improved when you follow the exercise, range of motion and muscle strengthening exercises prescribed by your doctor. Safety measures are also important to protect the joint from further injury. Any time any of these exercises cause you to have increased pain or swelling, decrease what you are doing until you are comfortable again and then slowly increase them. If you have problems or questions, call your caregiver or physical therapist for advice.   Rehabilitation is important following a joint replacement. After just a few days of immobilization, the muscles of the leg can become weakened and shrink (atrophy).  These exercises are designed to build up the tone and strength of the thigh and leg  muscles and to improve motion. Often times heat used for twenty to thirty minutes before working out will loosen up your tissues and help with improving the range of motion but do not use heat for the first two weeks following surgery (sometimes heat can increase post-operative swelling).   These exercises can be done on a training (exercise) mat, on the floor, on a table or on a bed. Use whatever works the best and is most comfortable for you.    Use music or television  while you are exercising so that the exercises are a pleasant break in your day. This will make your life better with the exercises acting as a break in your routine that you can look forward to.   Perform all exercises about fifteen times, three times per day or as directed.  You should exercise both the operative leg and the other leg as well.  Exercises include:   Quad Sets - Tighten up the muscle on the front of the thigh (Quad) and hold for 5-10 seconds.   Straight Leg Raises - With your knee straight (if you were given a brace, keep it on), lift the leg to 60 degrees, hold for 3 seconds, and slowly lower the leg.  Perform this exercise against resistance later as your leg gets stronger.  Leg Slides: Lying on your back, slowly slide your foot toward your buttocks, bending your knee up off the floor (only go as far as is comfortable). Then slowly slide your foot back down until your leg is flat on the floor again.  Angel Wings: Lying on your back spread your legs to the side as far apart as you can without causing discomfort.  Hamstring Strength:  Lying on your back, push your heel against the floor with your leg straight by tightening up the muscles of your buttocks.  Repeat, but this time bend your knee to a comfortable angle, and push your heel against the floor.  You may put a pillow under the heel to make it more comfortable if necessary.   A rehabilitation program following joint replacement surgery can speed recovery and prevent re-injury in the future due to weakened muscles. Contact your doctor or a physical therapist for more information on knee rehabilitation.    CONSTIPATION  Constipation is defined medically as fewer than three stools per week and severe constipation as less than one stool per week.  Even if you have a regular bowel pattern at home, your normal regimen is likely to be disrupted due to multiple reasons following surgery.  Combination of anesthesia, postoperative  narcotics, change in appetite and fluid intake all can affect your bowels.   YOU MUST use at least one of the following options; they are listed in order of increasing strength to get the job done.  They are all available over the counter, and you may need to use some, POSSIBLY even all of these options:    Drink plenty of fluids (prune juice may be helpful) and high fiber foods Colace 100 mg by mouth twice a day  Senokot for constipation as directed and as needed Dulcolax (bisacodyl ), take with full glass of water  Miralax  (polyethylene glycol) once or twice a day as needed.  If you have tried all these things and are unable to have a bowel movement in the first 3-4 days after surgery call either your surgeon or your primary doctor.    If you experience loose stools or diarrhea, hold the medications until you  stool forms back up.  If your symptoms do not get better within 1 week or if they get worse, check with your doctor.  If you experience the worst abdominal pain ever or develop nausea or vomiting, please contact the office immediately for further recommendations for treatment.   ITCHING:  If you experience itching with your medications, try taking only a single pain pill, or even half a pain pill at a time.  You can also use Benadryl  over the counter for itching or also to help with sleep.   TED HOSE STOCKINGS:  Use stockings on both legs until for at least 2 weeks or as directed by physician office. They may be removed at night for sleeping.  MEDICATIONS:  See your medication summary on the "After Visit Summary" that nursing will review with you.  You may have some home medications which will be placed on hold until you complete the course of blood thinner medication.  It is important for you to complete the blood thinner medication as prescribed.  PRECAUTIONS:  If you experience chest pain or shortness of breath - call 911 immediately for transfer to the hospital emergency department.    If you develop a fever greater that 101 F, purulent drainage from wound, increased redness or drainage from wound, foul odor from the wound/dressing, or calf pain - CONTACT YOUR SURGEON.                                                   FOLLOW-UP APPOINTMENTS:  If you do not already have a post-op appointment, please call the office for an appointment to be seen by your surgeon.  Guidelines for how soon to be seen are listed in your "After Visit Summary", but are typically between 1-4 weeks after surgery.  OTHER INSTRUCTIONS:   Knee Replacement:  Do not place pillow under knee, focus on keeping the knee straight while resting. CPM instructions: 0-90 degrees, 2 hours in the morning, 2 hours in the afternoon, and 2 hours in the evening. Place foam block, curve side up under heel at all times except when in CPM or when walking.  DO NOT modify, tear, cut, or change the foam block in any way.  POST-OPERATIVE OPIOID TAPER INSTRUCTIONS: It is important to wean off of your opioid medication as soon as possible. If you do not need pain medication after your surgery it is ok to stop day one. Opioids include: Codeine, Hydrocodone(Norco, Vicodin), Oxycodone (Percocet, oxycontin ) and hydromorphone  amongst others.  Long term and even short term use of opiods can cause: Increased pain response Dependence Constipation Depression Respiratory depression And more.  Withdrawal symptoms can include Flu like symptoms Nausea, vomiting And more Techniques to manage these symptoms Hydrate well Eat regular healthy meals Stay active Use relaxation techniques(deep breathing, meditating, yoga) Do Not substitute Alcohol to help with tapering If you have been on opioids for less than two weeks and do not have pain than it is ok to stop all together.  Plan to wean off of opioids This plan should start within one week post op of your joint replacement. Maintain the same interval or time between taking each dose  and first decrease the dose.  Cut the total daily intake of opioids by one tablet each day Next start to increase the time between doses. The last dose that should  be eliminated is the evening dose.   MAKE SURE YOU:  Understand these instructions.  Get help right away if you are not doing well or get worse.    Thank you for letting us  be a part of your medical care team.  It is a privilege we respect greatly.  We hope these instructions will help you stay on track for a fast and full recovery!    Information on my medicine - ELIQUIS  (apixaban )  Why was Eliquis  prescribed for you? Eliquis  was prescribed for you to reduce the risk of blood clots forming after orthopedic surgery.    What do You need to know about Eliquis ? Take your Eliquis  TWICE DAILY - one tablet in the morning and one tablet in the evening with or without food.  It would be best to take the dose about the same time each day.  If you have difficulty swallowing the tablet whole please discuss with your pharmacist how to take the medication safely.  Take Eliquis  exactly as prescribed by your doctor and DO NOT stop taking Eliquis  without talking to the doctor who prescribed the medication.  Stopping without other medication to take the place of Eliquis  may increase your risk of developing a clot.  After discharge, you should have regular check-up appointments with your healthcare provider that is prescribing your Eliquis .  What do you do if you miss a dose? If a dose of ELIQUIS  is not taken at the scheduled time, take it as soon as possible on the same day and twice-daily administration should be resumed.  The dose should not be doubled to make up for a missed dose.  Do not take more than one tablet of ELIQUIS  at the same time.  Important Safety Information A possible side effect of Eliquis  is bleeding. You should call your healthcare provider right away if you experience any of the following: Bleeding from an  injury or your nose that does not stop. Unusual colored urine (red or dark brown) or unusual colored stools (red or black). Unusual bruising for unknown reasons. A serious fall or if you hit your head (even if there is no bleeding).  Some medicines may interact with Eliquis  and might increase your risk of bleeding or clotting while on Eliquis . To help avoid this, consult your healthcare provider or pharmacist prior to using any new prescription or non-prescription medications, including herbals, vitamins, non-steroidal anti-inflammatory drugs (NSAIDs) and supplements.  This website has more information on Eliquis  (apixaban ): http://[email or web address] .com/eliquis dena

## 2023-12-25 NOTE — Op Note (Unsigned)
 NAME: [name], [name] MEDICAL RECORD NO: [id number] ACCOUNT NO: [id number] DATE OF BIRTH: 1956-01-25 FACILITY: [facility] LOCATION: [facility] PHYSICIAN: Reyes KYM Billing, MD  Operative Report   DATE OF PROCEDURE: 12/25/2023  PREOPERATIVE DIAGNOSES: 1.  End-stage osteoarthrosis, varus deformity, right knee. 2.  Retained hardware, tibia.  POSTOPERATIVE DIAGNOSES: 1.  End-stage osteoarthrosis, varus deformity, right knee. 2.  Retained hardware, tibia.  PROCEDURE PERFORMED: 1.  Right total knee arthroplasty utilizing Attune rotating platform, 5 femur, 4 tibia, 6 mm insert, 35 patella. 2.  Removal of hardware, left proximal tibia, large frag screw.  ANESTHESIA:  Spinal.  ASSISTANT:  Jaclyn Bissell, PA.  INDICATION:  A 68 year old with end-stage osteoarthrosis, medial compartment varus deformity of the right knee indicated for replacement of the degenerated joint.  Risks and benefits were discussed including bleeding, infection, damage to neurovascular  structures, no change in symptoms or worsening symptoms, DVT, PE, anesthetic complications, etc.  DESCRIPTION OF PROCEDURE:  The patient in supine position.  After induction of adequate spinal anesthesia, 2 g of Kefzol , the right lower extremity was prepped, draped and exsanguinated in the usual sterile fashion.  The thigh tourniquet inflated to 225  mmHg.  A midline incision was then made over the knee.  Full-thickness flaps developed.  Median parapatellar arthrotomy was then performed.  We had excised the previous surgical incision scar proximally for approximately 3 cm.  Elevated soft tissues  medially preserving the MCL.  Patella everted, gently flexed.  Tricompartmental osteoarthrosis was noted, bone-on-bone, removed osteophytes.  Debrided the fat pad, removed remnants of the medial and lateral meniscus and ACL.  I fashioned the notch above  the femoral notch as a starting hole for the femoral drill, which was drilled in line with the femur,  entering the femoral canal without difficulty.  This was irrigated, confirmed by T-handle intramedullary guide, 5-degree right with 9 off the distal  femur due to the lack of a flexion contracture.  I then performed a distal femoral cut without difficulty.  I then sized the femur off the anterior cortex to be a 5, 3 degrees of external rotation, pinned.  I performed anterior and posterior chamfer cuts  without notching.  I then subluxed the tibia.  Used the external alignment guide, bisect the tibiotalar joint parallel to the shaft, 2 mm off the defect, which was posteromedially.  This was then pinned.  I performed a tibial cut without difficulty  protecting the soft tissues at all times and then in extension, used a 6 mm insert to assess the extension gap, which was satisfactory as was the flexion gap.  Reflexed the knee, subluxed the tibia.  I sized it to a 4, maximizing the coverage just the  medial third of the tibial tubercle.  Prior to this, I harvested the bone centrally and impacted into the distal femur.  We noted the transfixing screw of the proximal tibia blocking central drilling.  I then identified the head of the screw just medial  distal part of the patellar tendon.  This was then removed without difficulty in its entirety.  I curetted the hole and irrigated it.  Then with the tray, we drilled centrally used our punch guide.  Attention back to the femur.  A box cut guide was  applied, bisecting the canals.  Box cut performed, then rasped.  Placed a trial femur, which fit flush and a 6 mm insert reduced it.  Had full extension, full flexion, good stability to varus and valgus stressing at 0 or  30 degrees.  Negative anterior  drawer.  I then everted the patella and measured 22 plane it to a 15 with the patellar jig and the shim.  This was without difficulty.  Sized to a 35 with a paddle parallel to the joint line.  Peg holes were drilled.  Trial placed reduced.  Had excellent  patellofemoral  tracking.  Just slight tightness laterally.  I performed a lateral release.  I then removed all instrumentation.  I checked posteriorly.  Capsule and popliteus was intact.  I used a lamina spreader, used Exparel  through the posteromedial  capsule, aspirating without a break in the vacuum and injecting 10 mL.  I then anesthetized the medial and lateral gutters, periosteum of the femur, quadriceps, proximal tibia, etc.  Then used pulsatile lavage cleaning all bony surfaces.  The knee was  then flexed. All surfaces thoroughly dried.  I mixed cement on the back table.  I placed cement in the proximal tibia digitally pressurizing it.  Cement was placed in the tibial tray and impacted it into place.  Redundant cement removed.  Cement on the  distal femur and the femoral component impacted into place, fit flush, placed a 6 mm insert, reduced it into full extension with an axial load throughout the curing of the cement.  Cemented and clamped the patella, placed Marcaine  with epinephrine  into  the wound and then Prontosan to allow curing of the cement with the knee covered.  After curing the cement, the tourniquet was deflated for 57 minutes.  We used Aquamantys for cautery as she has had previous surgery.  We achieved strict hemostasis.  I  removed the trial insert.  Meticulously removed all redundant cement and used pulsatile lavage to thoroughly clean the joint.  Then Prontosan was placed on the polyethylene insert.  I had a 6 mm insert and reduced it and again I had full extension, full  flexion.  Good stability to varus and valgus stress at 0 and 30 degrees.  Negative anterior drawer.  Then in mid flexion, we approximated the patellar arthrotomy with 1 Vicryl interrupted figure-of-eight sutures assisted with towel clips.  This was  oversewn with a running Stratafix.  Following this, I had excellent patellofemoral tracking and excellent stability.  Copiously irrigated once again with Prontosan subcutaneous then  with multiple 2-0 and the skin with a subcuticular Monocryl.  She had  flexion to gravity at 90 degrees.  Sterile dressing applied, placed in an immobilizer, then transported to the recovery room in satisfactory condition.  The patient tolerated the procedure well with no complications.  BLOOD LOSS:  50 mL  Assistant, Jaclyn Bissell, PA was used throughout the case for patient positioning, exposure, closure, etc.   PUS D: 12/25/2023 10:31:57 am T: 12/25/2023 11:07:00 am  JOB: [id number]/ [id number]

## 2023-12-25 NOTE — Anesthesia Procedure Notes (Signed)
 Spinal  Patient location during procedure: OR Start time: 12/25/2023 8:24 AM End time: 12/25/2023 8:28 AM Reason for block: surgical anesthesia Staffing Performed: anesthesiologist  Anesthesiologist: Paul Lamarr BRAVO, MD Performed by: Paul Lamarr BRAVO, MD Authorized by: Unice Vantassel E, MD   Preanesthetic Checklist Completed: patient identified, IV checked, risks and benefits discussed, surgical consent, monitors and equipment checked, pre-op evaluation and timeout performed Spinal Block Patient position: sitting Prep: DuraPrep and site prepped and draped Patient monitoring: continuous pulse ox, blood pressure and heart rate Approach: midline Location: L3-4 Injection technique: single-shot Needle Needle type: Whitacre  Needle gauge: 25 G Needle length: 12.7 cm Assessment Events: CSF return Additional Notes Risks, benefits, and alternative discussed. Patient gave consent to procedure. Prepped and draped in sitting position. Patient sedated but responsive to voice. One pass with Pencan 24g spinal needle with insufficient length to access intrathecal space. Whitacre 25g 12.7cm needle used through introducer with clear CSF return. Positive terminal aspiration. No pain or paraesthesias with injection. Patient tolerated procedure well. Vital signs stable. LANEY Paul, MD

## 2023-12-25 NOTE — Plan of Care (Signed)
   Problem: Health Behavior/Discharge Planning: Goal: Ability to manage health-related needs will improve Outcome: Progressing   Problem: Clinical Measurements: Goal: Will remain free from infection Outcome: Progressing   Problem: Clinical Measurements: Goal: Respiratory complications will improve Outcome: Progressing

## 2023-12-25 NOTE — Transfer of Care (Signed)
 Immediate Anesthesia Transfer of Care Note  Patient: Brittany [name]  Procedure(s) Performed: ARTHROPLASTY, KNEE, TOTAL (Right: Knee)  Patient Location: PACU  Anesthesia Type:MAC, Regional, and Spinal  Level of Consciousness: awake, alert , and oriented  Airway & Oxygen  Therapy: Patient Spontanous Breathing and Patient connected to face mask oxygen   Post-op Assessment: Report given to RN and Post -op Vital signs reviewed and stable  Post vital signs: Reviewed and stable  Last Vitals:  Vitals Value Taken Time  BP 127/89 12/25/23 11:00  Temp    Pulse 82 12/25/23 11:01  Resp 10 12/25/23 11:01  SpO2 97 % 12/25/23 11:01  Vitals shown include unfiled device data.  Last Pain:  Vitals:   12/25/23 0643  TempSrc: Oral  PainSc: 0-No pain         Complications: No notable events documented.

## 2023-12-25 NOTE — Interval H&P Note (Signed)
 History and Physical Interval Note:  12/25/2023 8:15 AM  Brittany [name]  has presented today for surgery, with the diagnosis of Osteoarthrits right knee.  The various methods of treatment have been discussed with the patient and family. After consideration of risks, benefits and other options for treatment, the patient has consented to  Procedure(s): ARTHROPLASTY, KNEE, TOTAL (Right) as a surgical intervention.  The patient's history has been reviewed, patient examined, no change in status, stable for surgery.  I have reviewed the patient's chart and labs.  Questions were answered to the patient's satisfaction.     Reyes JAYSON Billing

## 2023-12-25 NOTE — Anesthesia Postprocedure Evaluation (Signed)
 Anesthesia Post Note  Patient: Brittany [name]  Procedure(s) Performed: ARTHROPLASTY, KNEE, TOTAL (Right: Knee)     Patient location during evaluation: PACU Anesthesia Type: Spinal Level of consciousness: awake and alert Pain management: pain level controlled Vital Signs Assessment: post-procedure vital signs reviewed and stable Respiratory status: spontaneous breathing, nonlabored ventilation and respiratory function stable Cardiovascular status: blood pressure returned to baseline Postop Assessment: no apparent nausea or vomiting, no backache, no headache and spinal receding Anesthetic complications: no   No notable events documented.  Last Vitals:  Vitals:   12/25/23 1145 12/25/23 1200  BP: 127/85 130/69  Pulse: 92 76  Resp: (!) 6 16  Temp:    SpO2: 96% 98%    Last Pain:  Vitals:   12/25/23 1136  TempSrc:   PainSc: 7                  Vertell Row

## 2023-12-25 NOTE — Evaluation (Signed)
 Physical Therapy Evaluation Patient Details Name: Brittany [name] MRN: [id number] DOB: 30-Oct-1955 Today's Date: 12/25/2023  History of Present Illness  68 yo female presents to therapy s/p R TKA on 12/25/2023 due to failure of conservative measures. Pt PMH includes but is not limited to: anxiety, arthritis, asthma, bipolar and panic disorder, CAD, depression, gallstones, GERD, HLD, B LE edema, and L foot surgeries.  Clinical Impression     Brittany [name] is a 68 y.o. female POD 0 s/p R TKA. Patient reports IND with mobility at baseline. Patient is now limited by functional impairments (see PT problem list below) and requires S for bed mobility and CGA for transfers. Patient was able to ambulate 55 feet with RW and CGA level of assist. Patient instructed in exercise to facilitate ROM and circulation to manage edema. Patient will benefit from continued skilled PT interventions to address impairments and progress towards PLOF. Acute PT will follow to progress mobility and stair training in preparation for safe discharge home with family and social support and [facility] services.      If plan is discharge home, recommend the following: A little help with walking and/or transfers;A little help with bathing/dressing/bathroom;Assistance with cooking/housework;Help with stairs or ramp for entrance;Assist for transportation   Can travel by private vehicle        Equipment Recommendations Rolling walker (2 wheels) (youth)  Recommendations for Other Services       Functional Status Assessment Patient has had a recent decline in their functional status and demonstrates the ability to make significant improvements in function in a reasonable and predictable amount of time.     Precautions / Restrictions Precautions Precautions: Fall;Knee Restrictions Weight Bearing Restrictions Per Provider Order: No      Mobility  Bed Mobility Overal bed mobility: Needs Assistance Bed Mobility: Supine to Sit      Supine to sit: Supervision, HOB elevated     General bed mobility comments: min cues    Transfers Overall transfer level: Needs assistance Equipment used: Rolling walker (2 wheels) Transfers: Sit to/from Stand Sit to Stand: Contact guard assist           General transfer comment: min cues and pull to stand    Ambulation/Gait Ambulation/Gait assistance: Contact guard assist Gait Distance (Feet): 55 Feet Assistive device: Rolling walker (2 wheels) Gait Pattern/deviations: Step-to pattern, Decreased stance time - right, Antalgic Gait velocity: decreased     General Gait Details: pt may benefit from youth RW for improved B UE support to offload R LE in stance phase, step to pattern wtih min cues for RW management pt indicated feeling dizzy with gait tasks and Bp assessed upon sitting in recliner and 120/69 with pt reporting dizziness improving  Stairs            Wheelchair Mobility     Tilt Bed    Modified Rankin (Stroke Patients Only)       Balance Overall balance assessment: Needs assistance Sitting-balance support: Feet supported Sitting balance-Leahy Scale: Good     Standing balance support: Bilateral upper extremity supported, During functional activity, Reliant on assistive device for balance Standing balance-Leahy Scale: Poor                               Pertinent Vitals/Pain Pain Assessment Pain Assessment: 0-10 Pain Score: 10-Worst pain ever (12/10 throughout eval with pain medication administered just prior to PT arrival and no change in pain with  mobility, CP or repositioning. pt reported wanting to see if moving would help pain with no resolve) Pain Location: R LE and knee Pain Descriptors / Indicators: Aching, Burning, Constant, Discomfort, Grimacing, Operative site guarding, Tightness, Tender Pain Intervention(s): Limited activity within patient's tolerance, Monitored during session, Premedicated before session, Repositioned, Ice  applied    Home Living Family/patient expects to be discharged to:: Private residence Living Arrangements: Alone Available Help at Discharge: Family;Friend(s);Neighbor Type of Home: Apartment Home Access: Stairs to enter Entrance Stairs-Rails: None Entrance Stairs-Number of Steps: curb from parking lot, level entry from walkway   Home Layout: One level Home Equipment: Cane - single point;Rollator (4 wheels);Cane - quad      Prior Function Prior Level of Function : Independent/Modified Independent             Mobility Comments: IND no AD for all ADL, self care tasks and IADLs       Extremity/Trunk Assessment        Lower Extremity Assessment Lower Extremity Assessment: RLE deficits/detail RLE Deficits / Details: ankle DF/PF 5/5; SLR < 10 degree lag RLE Sensation: WNL    Cervical / Trunk Assessment Cervical / Trunk Assessment: Normal  Communication   Communication Communication: No apparent difficulties    Cognition Arousal: Alert Behavior During Therapy: WFL for tasks assessed/performed   PT - Cognitive impairments: No apparent impairments                         Following commands: Intact       Cueing       General Comments      Exercises Total Joint Exercises Ankle Circles/Pumps: AROM, Both, 10 reps   Assessment/Plan    PT Assessment Patient needs continued PT services  PT Problem List Decreased strength;Decreased range of motion;Decreased activity tolerance;Decreased balance;Decreased mobility;Decreased coordination;Pain       PT Treatment Interventions DME instruction;Gait training;Stair training;Functional mobility training;Therapeutic activities;Therapeutic exercise;Balance training;Neuromuscular re-education;Patient/family education;Modalities    PT Goals (Current goals can be found in the Care Plan section)  Acute Rehab PT Goals Patient Stated Goal: to be able to take care of my 3.5 yo grandchild PT Goal Formulation: With  patient Time For Goal Achievement: 01/08/24 Potential to Achieve Goals: Good    Frequency 7X/week     Co-evaluation               AM-PAC PT 6 Clicks Mobility  Outcome Measure Help needed turning from your back to your side while in a flat bed without using bedrails?: A Little Help needed moving from lying on your back to sitting on the side of a flat bed without using bedrails?: A Little Help needed moving to and from a bed to a chair (including a wheelchair)?: A Little Help needed standing up from a chair using your arms (e.g., wheelchair or bedside chair)?: A Little Help needed to walk in hospital room?: A Little Help needed climbing 3-5 steps with a railing? : Total 6 Click Score: 16    End of Session Equipment Utilized During Treatment: Gait belt Activity Tolerance: Patient limited by pain Patient left: in chair;with call bell/phone within reach Nurse Communication: Mobility status PT Visit Diagnosis: Unsteadiness on feet (R26.81);Other abnormalities of gait and mobility (R26.89);Muscle weakness (generalized) (M62.81);Pain;Difficulty in walking, not elsewhere classified (R26.2) Pain - Right/Left: Right Pain - part of body: Knee;Leg    Time: 8295-8267 PT Time Calculation (min) (ACUTE ONLY): 28 min   Charges:   PT Evaluation $  PT Eval Low Complexity: 1 Low PT Treatments $Gait Training: 8-22 mins PT General Charges $$ ACUTE PT VISIT: 1 Visit         [place], PT Acute Rehab   [place] VEAR Drone 12/25/2023, 5:58 PM

## 2023-12-26 ENCOUNTER — Other Ambulatory Visit (HOSPITAL_COMMUNITY): Payer: Self-pay

## 2023-12-26 ENCOUNTER — Encounter (HOSPITAL_COMMUNITY): Payer: Self-pay | Admitting: Specialist

## 2023-12-26 ENCOUNTER — Other Ambulatory Visit: Payer: Self-pay

## 2023-12-26 ENCOUNTER — Telehealth (HOSPITAL_COMMUNITY): Payer: Self-pay

## 2023-12-26 DIAGNOSIS — M1711 Unilateral primary osteoarthritis, right knee: Secondary | ICD-10-CM | POA: Diagnosis not present

## 2023-12-26 LAB — CBC WITH DIFFERENTIAL/PLATELET
Abs Immature Granulocytes: 0.02 K/uL (ref 0.00–0.07)
Basophils Absolute: 0 K/uL (ref 0.0–0.1)
Basophils Relative: 0 %
Eosinophils Absolute: 0 K/uL (ref 0.0–0.5)
Eosinophils Relative: 0 %
HCT: 37.1 % (ref 36.0–46.0)
Hemoglobin: 12.6 g/dL (ref 12.0–15.0)
Immature Granulocytes: 0 %
Lymphocytes Relative: 10 %
Lymphs Abs: 0.8 K/uL (ref 0.7–4.0)
MCH: 31.3 pg (ref 26.0–34.0)
MCHC: 34 g/dL (ref 30.0–36.0)
MCV: 92.3 fL (ref 80.0–100.0)
Monocytes Absolute: 0.9 K/uL (ref 0.1–1.0)
Monocytes Relative: 12 %
Neutro Abs: 6.1 K/uL (ref 1.7–7.7)
Neutrophils Relative %: 78 %
Platelets: 228 K/uL (ref 150–400)
RBC: 4.02 MIL/uL (ref 3.87–5.11)
RDW: 12.9 % (ref 11.5–15.5)
WBC: 7.9 K/uL (ref 4.0–10.5)
nRBC: 0 % (ref 0.0–0.2)

## 2023-12-26 LAB — BASIC METABOLIC PANEL WITH GFR
Anion gap: 8 (ref 5–15)
BUN: 14 mg/dL (ref 8–23)
CO2: 24 mmol/L (ref 22–32)
Calcium: 8.1 mg/dL — ABNORMAL LOW (ref 8.9–10.3)
Chloride: 97 mmol/L — ABNORMAL LOW (ref 98–111)
Creatinine, Ser: 0.56 mg/dL (ref 0.44–1.00)
GFR, Estimated: >60 mL/min (ref >60)
Glucose, Bld: 132 mg/dL — ABNORMAL HIGH (ref 70–99)
Potassium: 3.7 mmol/L (ref 3.5–5.1)
Sodium: 129 mmol/L — ABNORMAL LOW (ref 135–145)

## 2023-12-26 MED ORDER — HYDROMORPHONE HCL 2 MG PO TABS: 2.0000 mg | ORAL_TABLET | ORAL | 0 refills | Status: DC | PRN

## 2023-12-26 MED ORDER — PANTOPRAZOLE SODIUM 40 MG PO TBEC
40.0000 mg | DELAYED_RELEASE_TABLET | Freq: Two times a day (BID) | ORAL | Status: DC
  Administered 2023-12-26 – 2023-12-28 (×4): 40 mg via ORAL
  Filled 2023-12-26 (×4): qty 1

## 2023-12-26 MED ORDER — ASPIRIN 81 MG PO TBEC: 81.0000 mg | DELAYED_RELEASE_TABLET | Freq: Two times a day (BID) | ORAL | 0 refills | Status: DC

## 2023-12-26 MED ORDER — METHOCARBAMOL 500 MG PO TABS: 500.0000 mg | ORAL_TABLET | Freq: Three times a day (TID) | ORAL | 1 refills | Status: DC | PRN

## 2023-12-26 MED ORDER — FAMOTIDINE 20 MG PO TABS
20.0000 mg | ORAL_TABLET | Freq: Two times a day (BID) | ORAL | Status: DC
  Administered 2023-12-26 – 2023-12-28 (×4): 20 mg via ORAL
  Filled 2023-12-26 (×4): qty 1

## 2023-12-26 MED ORDER — DOCUSATE SODIUM 100 MG PO CAPS: 100.0000 mg | ORAL_CAPSULE | Freq: Two times a day (BID) | ORAL | 2 refills | Status: DC

## 2023-12-26 MED ORDER — APIXABAN 2.5 MG PO TABS: 2.5000 mg | ORAL_TABLET | Freq: Two times a day (BID) | ORAL | 1 refills | Status: DC

## 2023-12-26 MED ORDER — FAMOTIDINE 20 MG PO TABS: 20.0000 mg | ORAL_TABLET | Freq: Two times a day (BID) | ORAL | 0 refills | Status: DC

## 2023-12-26 MED ORDER — POLYETHYLENE GLYCOL 3350 17 G PO PACK: 17.0000 g | PACK | Freq: Every day | ORAL | 0 refills | Status: DC

## 2023-12-26 MED ORDER — GAVISCON 95-358 MG/15ML PO SUSP: 15.0000 mL | ORAL | 0 refills | Status: AC | PRN

## 2023-12-26 NOTE — Plan of Care (Signed)

## 2023-12-26 NOTE — TOC Transition Note (Signed)
 Transition of Care [facility]) - Discharge Note   Patient Details  Name: Brittany [name] MRN: [id number] Date of Birth: April 06, 1956  [facility] CM/SW Contact:  NORMAN ASPEN, LCSW Phone Number: 12/26/2023, 3:45 PM   Clinical Narrative:     Met with pt today to review dc needs.  Pt aware the HHPT prearranged with [facility] via ortho MD office prior to surgery.  Of note, a RW was ordered with [employer], however, pt informed [employer] that she received a rollator less than 5 yrs ago.  Private pay option discussed with pt, however, pt states that she will reach out to friend to borrow a RW.   No further TOC needs.  Final next level of care: Home w Home Health Services Barriers to Discharge: No Barriers Identified   Patient Goals and CMS Choice Patient states their goals for this hospitalization and ongoing recovery are:: return home          Discharge Placement                       Discharge Plan and Services Additional resources added to the After Visit Summary for                  DME Arranged: N/A DME Agency: NA       HH Arranged: PT HH Agency: [facility]        Social Drivers of Health (SDOH) Interventions SDOH Screenings   Food Insecurity: Patient Declined (12/25/2023)  Recent Concern: Food Insecurity - Food Insecurity Present (12/01/2023)  Housing: Unknown (12/25/2023)  Transportation Needs: No Transportation Needs (12/25/2023)  Utilities: Not At Risk (12/25/2023)  Alcohol Screen: Low Risk  (12/01/2023)  Depression (PHQ2-9): Medium Risk (12/02/2023)  Financial Resource Strain: Medium Risk (12/01/2023)  Physical Activity: Insufficiently Active (12/01/2023)  Social Connections: Moderately Integrated (12/25/2023)  Recent Concern: Social Connections - Moderately Isolated (12/01/2023)  Stress: Stress Concern Present (12/01/2023)  Tobacco Use: Medium Risk (12/25/2023)  Health Literacy: Inadequate Health Literacy (12/02/2023)     Readmission Risk  Interventions     No data to display

## 2023-12-26 NOTE — Progress Notes (Signed)
 Physical Therapy Treatment Patient Details Name: Brittany [name] MRN: [id number] DOB: 1955/11/22 Today's Date: 12/26/2023   History of Present Illness 68 yo female presents to therapy s/p R TKA on 12/25/2023 due to failure of conservative measures. Pt PMH includes but is not limited to: anxiety, arthritis, asthma, bipolar and panic disorder, CAD, depression, gallstones, GERD, HLD, B LE edema, and L foot surgeries.    PT Comments  Pt very cooperative and HEP initiated.  However, with activity pt c/o nausea and lightheadedness and progressed only to sitting up in chair.  RN aware and assessing pt.    If plan is discharge home, recommend the following: A little help with walking and/or transfers;A little help with bathing/dressing/bathroom;Assistance with cooking/housework;Help with stairs or ramp for entrance;Assist for transportation   Can travel by private vehicle        Equipment Recommendations  Rolling walker (2 wheels)    Recommendations for Other Services       Precautions / Restrictions Precautions Precautions: Fall;Knee Restrictions Weight Bearing Restrictions Per Provider Order: No     Mobility  Bed Mobility Overal bed mobility: Needs Assistance Bed Mobility: Supine to Sit     Supine to sit: Supervision, HOB elevated     General bed mobility comments: min cues    Transfers Overall transfer level: Needs assistance Equipment used: Rolling walker (2 wheels) Transfers: Sit to/from Stand Sit to Stand: Min assist           General transfer comment: cues for LE management and use of UEs to self assist    Ambulation/Gait Ambulation/Gait assistance: Contact guard assist   Assistive device: Rolling walker (2 wheels) Gait Pattern/deviations: Step-to pattern, Shuffle, Trunk flexed Gait velocity: decreased     General Gait Details: cues for sequence; distance ltd by nausea   Stairs             Wheelchair Mobility     Tilt Bed    Modified Rankin  (Stroke Patients Only)       Balance Overall balance assessment: Needs assistance Sitting-balance support: Feet supported Sitting balance-Leahy Scale: Good     Standing balance support: Bilateral upper extremity supported, During functional activity, Reliant on assistive device for balance Standing balance-Leahy Scale: Poor                              Communication Communication Communication: No apparent difficulties  Cognition Arousal: Alert Behavior During Therapy: WFL for tasks assessed/performed   PT - Cognitive impairments: No apparent impairments                         Following commands: Intact      Cueing Cueing Techniques: Verbal cues  Exercises Total Joint Exercises Ankle Circles/Pumps: AROM, Both, 10 reps Quad Sets: AROM, Both, 10 reps, Supine Heel Slides: AAROM, Right, 10 reps, Supine Straight Leg Raises: AAROM, Right, 10 reps, Supine Goniometric ROM: AAROM R knee 0- 70    General Comments        Pertinent Vitals/Pain Pain Assessment Pain Assessment: 0-10 Pain Score: 7  Pain Location: R LE and knee Pain Descriptors / Indicators: Aching, Grimacing, Sore Pain Intervention(s): Monitored during session, Limited activity within patient's tolerance, Premedicated before session, Ice applied    Home Living                          Prior Function  PT Goals (current goals can now be found in the care plan section) Acute Rehab PT Goals Patient Stated Goal: to be able to take care of my 3.5 yo grandchild PT Goal Formulation: With patient Time For Goal Achievement: 01/08/24 Potential to Achieve Goals: Good Progress towards PT goals: Not progressing toward goals - comment (nausea)    Frequency    7X/week      PT Plan      Co-evaluation              AM-PAC PT 6 Clicks Mobility   Outcome Measure  Help needed turning from your back to your side while in a flat bed without using bedrails?: A  Little Help needed moving from lying on your back to sitting on the side of a flat bed without using bedrails?: A Little Help needed moving to and from a bed to a chair (including a wheelchair)?: A Little Help needed standing up from a chair using your arms (e.g., wheelchair or bedside chair)?: A Little Help needed to walk in hospital room?: Total Help needed climbing 3-5 steps with a railing? : Total 6 Click Score: 14    End of Session Equipment Utilized During Treatment: Gait belt Activity Tolerance: Other (comment);Patient limited by pain (nausea) Patient left: in chair;with call bell/phone within reach;with nursing/sitter in room Nurse Communication: Mobility status PT Visit Diagnosis: Unsteadiness on feet (R26.81);Other abnormalities of gait and mobility (R26.89);Muscle weakness (generalized) (M62.81);Pain;Difficulty in walking, not elsewhere classified (R26.2) Pain - Right/Left: Right Pain - part of body: Knee;Leg     Time: 9182-9151 PT Time Calculation (min) (ACUTE ONLY): 31 min  Charges:    $Therapeutic Exercise: 8-22 mins $Therapeutic Activity: 8-22 mins PT General Charges $$ ACUTE PT VISIT: 1 Visit                     [facility] PT Acute Rehabilitation Services Office [phone]    Ardice Boyan 12/26/2023, 8:55 AM

## 2023-12-26 NOTE — Progress Notes (Signed)
 Physical Therapy Treatment Patient Details Name: Brittany [name] MRN: [id number] DOB: April 11, 1956 Today's Date: 12/26/2023   History of Present Illness 68 yo female presents to therapy s/p R TKA on 12/25/2023 due to failure of conservative measures. Pt PMH includes but is not limited to: anxiety, arthritis, asthma, bipolar and panic disorder, CAD, depression, gallstones, GERD, HLD, B LE edema, and L foot surgeries.    PT Comments  Pt continues cooperative and with noted improvement with sit to stand but with attempt to ambulate to bathroom, pt incontinent of urine and brought to [facility] instead.  Pt requiring significant assist of CNA for follow up hygiene and redressing.  Pt does not appear to be ready for dc home with stop by assist.    If plan is discharge home, recommend the following: A little help with walking and/or transfers;A little help with bathing/dressing/bathroom;Assistance with cooking/housework;Help with stairs or ramp for entrance;Assist for transportation   Can travel by private vehicle        Equipment Recommendations  Rolling walker (2 wheels)    Recommendations for Other Services       Precautions / Restrictions Precautions Precautions: Fall;Knee Restrictions Weight Bearing Restrictions Per Provider Order: No     Mobility  Bed Mobility Overal bed mobility: Needs Assistance Bed Mobility: Supine to Sit     Supine to sit: Supervision, HOB elevated     General bed mobility comments: up in chair and on BSC at session end    Transfers Overall transfer level: Needs assistance Equipment used: Rolling walker (2 wheels) Transfers: Sit to/from Stand Sit to Stand: Contact guard assist, Supervision           General transfer comment: cues for LE management and use of UEs to self assist    Ambulation/Gait Ambulation/Gait assistance: Contact guard assist Gait Distance (Feet): 6 Feet Assistive device: Rolling walker (2 wheels) Gait Pattern/deviations: Step-to  pattern, Shuffle, Trunk flexed Gait velocity: decreased     General Gait Details: cues for sequence, posture and position from RW; distance ltd by c/o increased pain and mild lightheadedness - BP 120/81   Stairs             Wheelchair Mobility     Tilt Bed    Modified Rankin (Stroke Patients Only)       Balance Overall balance assessment: Needs assistance Sitting-balance support: Feet supported Sitting balance-Leahy Scale: Good     Standing balance support: Bilateral upper extremity supported, During functional activity, Reliant on assistive device for balance Standing balance-Leahy Scale: Poor                              Communication Communication Communication: No apparent difficulties  Cognition Arousal: Alert Behavior During Therapy: WFL for tasks assessed/performed   PT - Cognitive impairments: No apparent impairments                         Following commands: Intact      Cueing Cueing Techniques: Verbal cues  Exercises Total Joint Exercises Ankle Circles/Pumps: AROM, Both, 10 reps Quad Sets: AROM, Both, 10 reps, Supine Heel Slides: AAROM, Right, 10 reps, Supine Straight Leg Raises: AAROM, Right, 10 reps, Supine Goniometric ROM: AAROM R knee 0- 70    General Comments        Pertinent Vitals/Pain Pain Assessment Pain Assessment: 0-10 Pain Score: 7  Pain Location: R LE and knee Pain Descriptors / Indicators:  Aching, Grimacing, Sore Pain Intervention(s): Limited activity within patient's tolerance, Monitored during session, Premedicated before session    Home Living                          Prior Function            PT Goals (current goals can now be found in the care plan section) Acute Rehab PT Goals Patient Stated Goal: to be able to take care of my 3.5 yo grandchild PT Goal Formulation: With patient Time For Goal Achievement: 01/08/24 Potential to Achieve Goals: Good Progress towards PT goals:  Progressing toward goals    Frequency    7X/week      PT Plan      Co-evaluation              AM-PAC PT 6 Clicks Mobility   Outcome Measure  Help needed turning from your back to your side while in a flat bed without using bedrails?: A Little Help needed moving from lying on your back to sitting on the side of a flat bed without using bedrails?: A Little Help needed moving to and from a bed to a chair (including a wheelchair)?: A Little Help needed standing up from a chair using your arms (e.g., wheelchair or bedside chair)?: A Little Help needed to walk in hospital room?: A Little Help needed climbing 3-5 steps with a railing? : Total 6 Click Score: 16    End of Session Equipment Utilized During Treatment: Gait belt Activity Tolerance: Patient tolerated treatment well Patient left: Other (comment) (BSC) Nurse Communication: Mobility status PT Visit Diagnosis: Unsteadiness on feet (R26.81);Other abnormalities of gait and mobility (R26.89);Muscle weakness (generalized) (M62.81);Pain;Difficulty in walking, not elsewhere classified (R26.2) Pain - Right/Left: Right Pain - part of body: Knee;Leg     Time: 1306-1330 PT Time Calculation (min) (ACUTE ONLY): 24 min  Charges:    $Therapeutic Activity: 8-22 mins PT General Charges $$ ACUTE PT VISIT: 1 Visit                     [facility] PT Acute Rehabilitation Services Office [phone]    Aliscia Clayton 12/26/2023, 3:47 PM

## 2023-12-26 NOTE — Progress Notes (Signed)
 Physical Therapy Treatment Patient Details Name: Brittany [name] MRN: [id number] DOB: [birth year] Today's Date: 12/26/2023   History of Present Illness 68 yo female presents to therapy s/p R TKA on 12/25/2023 due to failure of conservative measures. Pt PMH includes but is not limited to: anxiety, arthritis, asthma, bipolar and panic disorder, CAD, depression, gallstones, GERD, HLD, B LE edema, and L foot surgeries.    PT Comments  Pt continues motivated and with noted improvement in activity tolerance - pt denies nausea but with c/o increased pain and mild lightheadedness with activity.  BP supine 141/73; sit 126/71; stand 148/78; walking 120/81.  SaO2 on RA at rest in room 88-91%.  Rn aware.  Pt repeatedly expressing desire to dc home this date but does not have 24/7 - friends and family will be stopping by.  Will re-attempt after lunch.   If plan is discharge home, recommend the following: A little help with walking and/or transfers;A little help with bathing/dressing/bathroom;Assistance with cooking/housework;Help with stairs or ramp for entrance;Assist for transportation   Can travel by private vehicle        Equipment Recommendations  Rolling walker (2 wheels)    Recommendations for Other Services       Precautions / Restrictions Precautions Precautions: Fall;Knee Restrictions Weight Bearing Restrictions Per Provider Order: No     Mobility  Bed Mobility Overal bed mobility: Needs Assistance Bed Mobility: Supine to Sit     Supine to sit: Supervision, HOB elevated     General bed mobility comments: up in chair and returns to same for lunch    Transfers Overall transfer level: Needs assistance Equipment used: Rolling walker (2 wheels) Transfers: Sit to/from Stand Sit to Stand: Contact guard assist, Supervision           General transfer comment: cues for LE management and use of UEs to self assist    Ambulation/Gait Ambulation/Gait assistance: Contact guard  assist Gait Distance (Feet): 64 Feet Assistive device: Rolling walker (2 wheels) Gait Pattern/deviations: Step-to pattern, Shuffle, Trunk flexed Gait velocity: decreased     General Gait Details: cues for sequence, posture and position from RW; distance ltd by c/o increased pain and mild lightheadedness - BP 120/81   Stairs             Wheelchair Mobility     Tilt Bed    Modified Rankin (Stroke Patients Only)       Balance Overall balance assessment: Needs assistance Sitting-balance support: Feet supported Sitting balance-Leahy Scale: Good     Standing balance support: Bilateral upper extremity supported, During functional activity, Reliant on assistive device for balance Standing balance-Leahy Scale: Poor                              Communication Communication Communication: No apparent difficulties  Cognition Arousal: Alert Behavior During Therapy: WFL for tasks assessed/performed   PT - Cognitive impairments: No apparent impairments                         Following commands: Intact      Cueing Cueing Techniques: Verbal cues  Exercises Total Joint Exercises Ankle Circles/Pumps: AROM, Both, 10 reps Quad Sets: AROM, Both, 10 reps, Supine Heel Slides: AAROM, Right, 10 reps, Supine Straight Leg Raises: AAROM, Right, 10 reps, Supine Goniometric ROM: AAROM R knee 0- 70    General Comments        Pertinent Vitals/Pain Pain  Assessment Pain Assessment: 0-10 Pain Score: 8  Pain Location: R LE and knee Pain Descriptors / Indicators: Aching, Grimacing, Sore Pain Intervention(s): Limited activity within patient's tolerance, Monitored during session, Patient requesting pain meds-RN notified, Ice applied    Home Living                          Prior Function            PT Goals (current goals can now be found in the care plan section) Acute Rehab PT Goals Patient Stated Goal: to be able to take care of my 3.5 yo  grandchild PT Goal Formulation: With patient Time For Goal Achievement: 01/08/24 Potential to Achieve Goals: Good Progress towards PT goals: Progressing toward goals    Frequency    7X/week      PT Plan      Co-evaluation              AM-PAC PT 6 Clicks Mobility   Outcome Measure  Help needed turning from your back to your side while in a flat bed without using bedrails?: A Little Help needed moving from lying on your back to sitting on the side of a flat bed without using bedrails?: A Little Help needed moving to and from a bed to a chair (including a wheelchair)?: A Little Help needed standing up from a chair using your arms (e.g., wheelchair or bedside chair)?: A Little Help needed to walk in hospital room?: A Little Help needed climbing 3-5 steps with a railing? : Total 6 Click Score: 16    End of Session Equipment Utilized During Treatment: Gait belt Activity Tolerance: Patient tolerated treatment well Patient left: in chair;with call bell/phone within reach;with nursing/sitter in room Nurse Communication: Mobility status PT Visit Diagnosis: Unsteadiness on feet (R26.81);Other abnormalities of gait and mobility (R26.89);Muscle weakness (generalized) (M62.81);Pain;Difficulty in walking, not elsewhere classified (R26.2) Pain - Right/Left: Right Pain - part of body: Knee;Leg     Time: 1102-1130 PT Time Calculation (min) (ACUTE ONLY): 28 min  Charges:    $Gait Training: 8-22 mins $Therapeutic Activity: 8-22 mins PT General Charges $$ ACUTE PT VISIT: 1 Visit                     [facility] PT Acute Rehabilitation Services Office [phone]    Yehudit Fulginiti 12/26/2023, 12:03 PM

## 2023-12-26 NOTE — Progress Notes (Signed)
 Subjective: 1 Day Post-Op Procedure(s) (LRB): ARTHROPLASTY, KNEE, TOTAL (Right) Patient reports pain as 4 on 0-10 scale.   Denies CP or SOB.  Voiding without difficulty. Positive flatus. Objective: Vital signs in last 24 hours: Temp:  [97.2 F (36.2 C)-98.4 F (36.9 C)] 97.5 F (36.4 C) (07/10 0559) Pulse Rate:  [76-97] 89 (07/10 0559) Resp:  [6-18] 17 (07/10 0559) BP: (123-138)/(61-91) 131/70 (07/10 0559) SpO2:  [93 %-99 %] 99 % (07/10 0559) Weight:  [86.4 kg] 86.4 kg (07/09 2200)  Intake/Output from previous day: 07/09 0701 - 07/10 0700 In: 2480 [P.O.:580; I.V.:1500; IV Piggyback:400] Out: 1750 [Urine:1700; Blood:50] Intake/Output this shift: No intake/output data recorded.  No results for input(s): HGB in the last 72 hours. No results for input(s): WBC, RBC, HCT, PLT in the last 72 hours. No results for input(s): NA, K, CL, CO2, BUN, CREATININE, GLUCOSE, CALCIUM  in the last 72 hours. No results for input(s): LABPT, INR in the last 72 hours.  Neurologically intact Neurovascular intact Sensation intact distally Intact pulses distally Dorsiflexion/Plantar flexion intact Incision: dressing C/D/I Compartment soft No DVT Assessment/Plan:  1 Day Post-Op Procedure(s) (LRB): ARTHROPLASTY, KNEE, TOTAL (Right) Sodium 129.  Symptomatic.  Will check in the morning. Patient reports that she does not have the out-of-pocket funds for the Eliquis .  Spoke with the pharmacist.  The aversion to aspirin  was burning of the stomach.  That subsequently may have aggravated her asthma.  No wheezing with the asthma.  She takes occasional aspirin  at home.  Will increase her GERD medicine.  Have her take Eliquis  tonight and then begin baby aspirin  tomorrow.  Principal Problem:   Right knee DJD

## 2023-12-26 NOTE — Telephone Encounter (Addendum)
 Pharmacy Patient Advocate Encounter  Insurance verification completed.    The patient is insured through [employer]/ [employer]. Patient has Medicare and is not eligible for a copay card, but may be able to apply for patient assistance or Medicare RX Payment Plan (Patient Must reach out to their plan, if eligible for payment plan), if available.    Ran test claim for Eliquis  5mg  and the current 30 day co-pay is $47.00.   This test claim was processed through [facility]- copay amounts may vary at other pharmacies due to pharmacy/plan contracts, or as the patient moves through the different stages of their insurance plan.

## 2023-12-26 NOTE — Progress Notes (Signed)
 Physical Therapy Treatment Patient Details Name: Brittany [name] MRN: [id number] DOB: 1956-01-26 Today's Date: 12/26/2023   History of Present Illness 68 yo female presents to therapy s/p R TKA on 12/25/2023 due to failure of conservative measures. Pt PMH includes but is not limited to: anxiety, arthritis, asthma, bipolar and panic disorder, CAD, depression, gallstones, GERD, HLD, B LE edema, and L foot surgeries.    PT Comments  Pt continues very cooperative but requiring increased time for all tasks and c/o elevating pain level and significant dizziness with ambulation.  Pt up to ambulate limited distance in hall, negotiated single step and assisted to bed.  BP with ambulation 109/52 with pt c/o feeling very hot and dizzy - RN aware.    If plan is discharge home, recommend the following: A little help with walking and/or transfers;A little help with bathing/dressing/bathroom;Assistance with cooking/housework;Help with stairs or ramp for entrance;Assist for transportation   Can travel by private vehicle        Equipment Recommendations  Rolling walker (2 wheels)    Recommendations for Other Services       Precautions / Restrictions Precautions Precautions: Fall;Knee Restrictions Weight Bearing Restrictions Per Provider Order: No     Mobility  Bed Mobility Overal bed mobility: Needs Assistance Bed Mobility: Sit to Supine     Supine to sit: Supervision, HOB elevated Sit to supine: Supervision   General bed mobility comments: Increased time with cues for sequence    Transfers Overall transfer level: Needs assistance Equipment used: Rolling walker (2 wheels) Transfers: Sit to/from Stand Sit to Stand: Supervision           General transfer comment: Pt self-cues for LE management and use of UEs to self assist    Ambulation/Gait Ambulation/Gait assistance: Contact guard assist Gait Distance (Feet): 54 Feet Assistive device: Rolling walker (2 wheels) Gait  Pattern/deviations: Step-to pattern, Shuffle, Trunk flexed Gait velocity: decreased     General Gait Details: cues for sequence, posture and position from RW; distance ltd by c/o increased pain and mild lightheadedness - BP 109/52.   Stairs Stairs: Yes Stairs assistance: Min assist Stair Management: No rails, Step to pattern, Forwards, With walker Number of Stairs: 1 General stair comments: cues for sequence   Wheelchair Mobility     Tilt Bed    Modified Rankin (Stroke Patients Only)       Balance Overall balance assessment: Needs assistance Sitting-balance support: Feet supported Sitting balance-Leahy Scale: Good     Standing balance support: Bilateral upper extremity supported, During functional activity, Reliant on assistive device for balance Standing balance-Leahy Scale: Poor                              Communication Communication Communication: No apparent difficulties  Cognition Arousal: Alert Behavior During Therapy: WFL for tasks assessed/performed   PT - Cognitive impairments: No apparent impairments                         Following commands: Intact      Cueing Cueing Techniques: Verbal cues  Exercises Total Joint Exercises Ankle Circles/Pumps: AROM, Both, 10 reps Quad Sets: AROM, Both, 10 reps, Supine Heel Slides: AAROM, Right, 10 reps, Supine Straight Leg Raises: AAROM, Right, 10 reps, Supine Goniometric ROM: AAROM R knee 0- 70    General Comments        Pertinent Vitals/Pain Pain Assessment Pain Assessment: 0-10 Pain Score: 8  Pain Location: R LE and knee Pain Descriptors / Indicators: Aching, Grimacing, Sore Pain Intervention(s): Limited activity within patient's tolerance, Monitored during session, Premedicated before session, Ice applied    Home Living                          Prior Function            PT Goals (current goals can now be found in the care plan section) Acute Rehab PT  Goals Patient Stated Goal: to be able to take care of my 3.5 yo grandchild PT Goal Formulation: With patient Time For Goal Achievement: 01/08/24 Potential to Achieve Goals: Good Progress towards PT goals: Progressing toward goals    Frequency    7X/week      PT Plan      Co-evaluation              AM-PAC PT 6 Clicks Mobility   Outcome Measure  Help needed turning from your back to your side while in a flat bed without using bedrails?: A Little Help needed moving from lying on your back to sitting on the side of a flat bed without using bedrails?: A Little Help needed moving to and from a bed to a chair (including a wheelchair)?: A Little Help needed standing up from a chair using your arms (e.g., wheelchair or bedside chair)?: A Little Help needed to walk in hospital room?: A Little Help needed climbing 3-5 steps with a railing? : Total 6 Click Score: 16    End of Session Equipment Utilized During Treatment: Gait belt Activity Tolerance: Patient tolerated treatment well Patient left: in bed;with call bell/phone within reach;with bed alarm set;with family/visitor present Nurse Communication: Mobility status PT Visit Diagnosis: Unsteadiness on feet (R26.81);Other abnormalities of gait and mobility (R26.89);Muscle weakness (generalized) (M62.81);Pain;Difficulty in walking, not elsewhere classified (R26.2) Pain - Right/Left: Right Pain - part of body: Knee;Leg     Time: 1431-1456 PT Time Calculation (min) (ACUTE ONLY): 25 min  Charges:    $Gait Training: 8-22 mins $Therapeutic Activity: 8-22 mins PT General Charges $$ ACUTE PT VISIT: 1 Visit                     [facility] PT Acute Rehabilitation Services Office [phone]    Castella Lerner 12/26/2023, 3:55 PM

## 2023-12-27 ENCOUNTER — Other Ambulatory Visit (HOSPITAL_COMMUNITY): Payer: Self-pay

## 2023-12-27 DIAGNOSIS — E66812 Obesity, class 2: Secondary | ICD-10-CM | POA: Diagnosis present

## 2023-12-27 DIAGNOSIS — D649 Anemia, unspecified: Secondary | ICD-10-CM | POA: Diagnosis present

## 2023-12-27 DIAGNOSIS — M1711 Unilateral primary osteoarthritis, right knee: Secondary | ICD-10-CM | POA: Diagnosis not present

## 2023-12-27 DIAGNOSIS — R739 Hyperglycemia, unspecified: Secondary | ICD-10-CM | POA: Diagnosis present

## 2023-12-27 DIAGNOSIS — E871 Hypo-osmolality and hyponatremia: Secondary | ICD-10-CM | POA: Diagnosis not present

## 2023-12-27 LAB — CBC
HCT: 34.5 % — ABNORMAL LOW (ref 36.0–46.0)
Hemoglobin: 11.7 g/dL — ABNORMAL LOW (ref 12.0–15.0)
MCH: 30.8 pg (ref 26.0–34.0)
MCHC: 33.9 g/dL (ref 30.0–36.0)
MCV: 90.8 fL (ref 80.0–100.0)
Platelets: 237 K/uL (ref 150–400)
RBC: 3.8 MIL/uL — ABNORMAL LOW (ref 3.87–5.11)
RDW: 13 % (ref 11.5–15.5)
WBC: 7.3 K/uL (ref 4.0–10.5)
nRBC: 0 % (ref 0.0–0.2)

## 2023-12-27 LAB — BASIC METABOLIC PANEL WITH GFR
Anion gap: 8 (ref 5–15)
Anion gap: 9 (ref 5–15)
BUN: 10 mg/dL (ref 8–23)
BUN: 9 mg/dL (ref 8–23)
CO2: 26 mmol/L (ref 22–32)
CO2: 28 mmol/L (ref 22–32)
Calcium: 8.4 mg/dL — ABNORMAL LOW (ref 8.9–10.3)
Calcium: 8.7 mg/dL — ABNORMAL LOW (ref 8.9–10.3)
Chloride: 95 mmol/L — ABNORMAL LOW (ref 98–111)
Chloride: 91 mmol/L — ABNORMAL LOW (ref 98–111)
Creatinine, Ser: 0.5 mg/dL (ref 0.44–1.00)
Creatinine, Ser: 0.57 mg/dL (ref 0.44–1.00)
GFR, Estimated: >60 mL/min (ref >60)
GFR, Estimated: >60 mL/min (ref >60)
Glucose, Bld: 132 mg/dL — ABNORMAL HIGH (ref 70–99)
Glucose, Bld: 174 mg/dL — ABNORMAL HIGH (ref 70–99)
Potassium: 3.9 mmol/L (ref 3.5–5.1)
Potassium: 4.4 mmol/L (ref 3.5–5.1)
Sodium: 129 mmol/L — ABNORMAL LOW (ref 135–145)
Sodium: 128 mmol/L — ABNORMAL LOW (ref 135–145)

## 2023-12-27 LAB — URINALYSIS, ROUTINE W REFLEX MICROSCOPIC
Bacteria, UA: NONE SEEN
Bilirubin Urine: NEGATIVE
Glucose, UA: NEGATIVE mg/dL
Ketones, ur: 20 mg/dL — AB
Leukocytes,Ua: NEGATIVE
Nitrite: NEGATIVE
Protein, ur: 30 mg/dL — AB
Specific Gravity, Urine: 1.018 (ref 1.005–1.030)
pH: 5 (ref 5.0–8.0)

## 2023-12-27 LAB — OSMOLALITY, URINE: Osmolality, Ur: 540 mosm/kg (ref 300–900)

## 2023-12-27 LAB — OSMOLALITY: Osmolality: 276 mosm/kg (ref 275–295)

## 2023-12-27 LAB — SODIUM, URINE, RANDOM: Sodium, Ur: <10 mmol/L

## 2023-12-27 LAB — PHOSPHORUS: Phosphorus: 2.2 mg/dL — ABNORMAL LOW (ref 2.5–4.6)

## 2023-12-27 LAB — MAGNESIUM: Magnesium: 2 mg/dL (ref 1.7–2.4)

## 2023-12-27 MED ORDER — K PHOS MONO-SOD PHOS DI & MONO 155-852-130 MG PO TABS
500.0000 mg | ORAL_TABLET | Freq: Once | ORAL | Status: AC
  Administered 2023-12-27: 500 mg via ORAL
  Filled 2023-12-27: qty 2

## 2023-12-27 MED ORDER — K PHOS MONO-SOD PHOS DI & MONO 155-852-130 MG PO TABS
500.0000 mg | ORAL_TABLET | Freq: Four times a day (QID) | ORAL | Status: AC
  Administered 2023-12-27 (×2): 500 mg via ORAL
  Filled 2023-12-27 (×2): qty 2

## 2023-12-27 MED ORDER — HYDROMORPHONE HCL 2 MG PO TABS
2.0000 mg | ORAL_TABLET | ORAL | 0 refills | Status: DC | PRN
  Filled 2023-12-27: qty 40, 4d supply, fill #0

## 2023-12-27 MED ORDER — ASPIRIN 81 MG PO TBEC
81.0000 mg | DELAYED_RELEASE_TABLET | Freq: Every day | ORAL | Status: DC
  Administered 2023-12-27 – 2023-12-28 (×2): 81 mg via ORAL
  Filled 2023-12-27 (×2): qty 1

## 2023-12-27 NOTE — Progress Notes (Signed)
 Physical Therapy Treatment Patient Details Name: Brittany [name] MRN: [id number] DOB: [birth year] Today's Date: 12/27/2023   History of Present Illness 68 yo female presents to therapy s/p R TKA on 12/25/2023 due to failure of conservative measures. Pt PMH includes but is not limited to: anxiety, arthritis, asthma, bipolar and panic disorder, CAD, depression, gallstones, GERD, HLD, B LE edema, and L foot surgeries.    PT Comments  Pt continues very cooperative and with noted improvement in activity tolerance, stability with mobility and with no c/o dizziness with activity.  Pt performed HEP with assist, up to ambulate in hall and negotiated stairs. BP supine 144/74; sitting 137/64; standing 118/57; walking 107/73, walking further 124/82 - pt reports fatigue but denies dizziness.  Pt reports has arranged RW for home as borrowed from neighbor and eager for return home this date.    If plan is discharge home, recommend the following: A little help with walking and/or transfers;A little help with bathing/dressing/bathroom;Assistance with cooking/housework;Help with stairs or ramp for entrance;Assist for transportation   Can travel by private vehicle        Equipment Recommendations  Rolling walker (2 wheels)    Recommendations for Other Services       Precautions / Restrictions Precautions Precautions: Fall;Knee Restrictions Weight Bearing Restrictions Per Provider Order: No     Mobility  Bed Mobility               General bed mobility comments: Pt up in chair and returns to same    Transfers Overall transfer level: Needs assistance Equipment used: Rolling walker (2 wheels) Transfers: Sit to/from Stand Sit to Stand: Supervision           General transfer comment: Pt self-cues for LE management and use of UEs to self assist    Ambulation/Gait Ambulation/Gait assistance: Contact guard assist, Supervision Gait Distance (Feet): 48 Feet Assistive device: Rolling walker (2  wheels) Gait Pattern/deviations: Step-to pattern, Shuffle, Trunk flexed Gait velocity: decreased     General Gait Details: min cues for sequence, posture and position from RW; distance ltd by fatigue but with no c/o dizziness   Stairs Stairs: Yes Stairs assistance: Min assist Stair Management: No rails, Step to pattern, With walker, Backwards, Forwards Number of Stairs: 2 General stair comments: single step twice - once fwd and once bkwd - with RW and cues for sequence   Wheelchair Mobility     Tilt Bed    Modified Rankin (Stroke Patients Only)       Balance Overall balance assessment: Needs assistance Sitting-balance support: Feet supported Sitting balance-Leahy Scale: Good     Standing balance support: During functional activity, Reliant on assistive device for balance, Single extremity supported Standing balance-Leahy Scale: Poor                              Communication Communication Communication: No apparent difficulties  Cognition Arousal: Alert Behavior During Therapy: WFL for tasks assessed/performed   PT - Cognitive impairments: No apparent impairments                         Following commands: Intact      Cueing Cueing Techniques: Verbal cues  Exercises Total Joint Exercises Ankle Circles/Pumps: AROM, Both, 10 reps Quad Sets: AROM, Both, 10 reps, Supine Heel Slides: AAROM, Right, Supine, 15 reps Straight Leg Raises: AAROM, Right, Supine, 15 reps Goniometric ROM: AAROM at  R knee  0 - 65    General Comments        Pertinent Vitals/Pain Pain Assessment Pain Assessment: 0-10 Pain Score: 7  Pain Location: R LE and knee Pain Descriptors / Indicators: Aching, Grimacing, Sore Pain Intervention(s): Limited activity within patient's tolerance, Monitored during session, Premedicated before session, Ice applied    Home Living                          Prior Function            PT Goals (current goals can now  be found in the care plan section) Acute Rehab PT Goals Patient Stated Goal: to be able to take care of my 3.5 yo grandchild PT Goal Formulation: With patient Time For Goal Achievement: 01/08/24 Potential to Achieve Goals: Good Progress towards PT goals: Progressing toward goals    Frequency    7X/week      PT Plan      Co-evaluation              AM-PAC PT 6 Clicks Mobility   Outcome Measure  Help needed turning from your back to your side while in a flat bed without using bedrails?: A Little Help needed moving from lying on your back to sitting on the side of a flat bed without using bedrails?: A Little Help needed moving to and from a bed to a chair (including a wheelchair)?: A Little Help needed standing up from a chair using your arms (e.g., wheelchair or bedside chair)?: A Little Help needed to walk in hospital room?: A Little Help needed climbing 3-5 steps with a railing? : A Little 6 Click Score: 18    End of Session Equipment Utilized During Treatment: Gait belt Activity Tolerance: Patient tolerated treatment well Patient left: in chair;with call bell/phone within reach;with chair alarm set Nurse Communication: Mobility status PT Visit Diagnosis: Unsteadiness on feet (R26.81);Other abnormalities of gait and mobility (R26.89);Muscle weakness (generalized) (M62.81);Pain;Difficulty in walking, not elsewhere classified (R26.2) Pain - Right/Left: Right Pain - part of body: Knee;Leg     Time: 1013-1100 PT Time Calculation (min) (ACUTE ONLY): 47 min  Charges:    $Gait Training: 8-22 mins $Therapeutic Exercise: 8-22 mins $Therapeutic Activity: 8-22 mins PT General Charges $$ ACUTE PT VISIT: 1 Visit                     [facility] PT Acute Rehabilitation Services Office [phone]    Malyna Budney 12/27/2023, 1:02 PM

## 2023-12-27 NOTE — Progress Notes (Signed)
 Subjective: 2 Days Post-Op Procedure(s) (LRB): ARTHROPLASTY, KNEE, TOTAL (Right) Patient reports pain as moderate.   Reports pain about the knee. Hoping to go home today. Dr Duwayne discussed ASA vs Eliquis  with pharmacist yesterday. Pt was to receive test dose of ASA this AM. Already received Eliquis .  Objective: Vital signs in last 24 hours: Temp:  [98.3 F (36.8 C)-99.3 F (37.4 C)] 98.5 F (36.9 C) (07/11 0554) Pulse Rate:  [98-102] 100 (07/11 0554) Resp:  [16-18] 17 (07/11 0554) BP: (119-153)/(71-83) 153/78 (07/11 0554) SpO2:  [92 %-99 %] 99 % (07/11 0856)  Intake/Output from previous day: 07/10 0701 - 07/11 0700 In: 2080 [P.O.:1680; I.V.:400] Out: -  Intake/Output this shift: Total I/O In: 300 [P.O.:300] Out: -   Recent Labs    12/26/23 0941 12/27/23 0340  HGB 12.6 11.7*   Recent Labs    12/26/23 0941 12/27/23 0340  WBC 7.9 7.3  RBC 4.02 3.80*  HCT 37.1 34.5*  PLT 228 237   Recent Labs    12/26/23 0941 12/27/23 0340  NA 129* 129*  K 3.7 3.9  CL 97* 95*  CO2 24 26  BUN 14 10  CREATININE 0.56 0.50  GLUCOSE 132* 132*  CALCIUM  8.1* 8.4*   No results for input(s): LABPT, INR in the last 72 hours.  Neurologically intact ABD soft Neurovascular intact Sensation intact distally Intact pulses distally Dorsiflexion/Plantar flexion intact Incision: dressing C/D/I and no drainage No cellulitis present Compartment soft No calf pain or sign of DVT   Assessment/Plan: 2 Days Post-Op Procedure(s) (LRB): ARTHROPLASTY, KNEE, TOTAL (Right) Advance diet Up with therapy D/C IV fluids D/C eliquis  Test dose of ASA today If tolerates ASA will send home on ASA for DVT ppx as she cannot afford copay for Eliquis  Dilaudid  out of stock at [facility] pharmacy - sending to [facility] pharmacy to pick up prior to d/c   Brittany [name] 12/27/2023, 9:12 AM

## 2023-12-27 NOTE — Progress Notes (Signed)
 Physical Therapy Treatment Patient Details Name: Brittany [name] MRN: [id number] DOB: 10/10/1955 Today's Date: 12/27/2023   History of Present Illness 68 yo female presents to therapy s/p R TKA on 12/25/2023 due to failure of conservative measures. Pt PMH includes but is not limited to: anxiety, arthritis, asthma, bipolar and panic disorder, CAD, depression, gallstones, GERD, HLD, B LE edema, and L foot surgeries.    PT Comments  Pt continues cooperative but fatigued this pm.  Pt agreeable to perform therex program with assist.  Provided with ice packs and positioned for comfort.  Pt hopeful for dc home tomorrow..   If plan is discharge home, recommend the following: A little help with walking and/or transfers;A little help with bathing/dressing/bathroom;Assistance with cooking/housework;Help with stairs or ramp for entrance;Assist for transportation   Can travel by private vehicle        Equipment Recommendations  Rolling walker (2 wheels)    Recommendations for Other Services       Precautions / Restrictions Precautions Precautions: Fall;Knee Restrictions Weight Bearing Restrictions Per Provider Order: No     Mobility  Bed Mobility               General bed mobility comments: Pt up in chair and returns to same    Transfers Overall transfer level: Needs assistance Equipment used: Rolling walker (2 wheels) Transfers: Sit to/from Stand Sit to Stand: Supervision           General transfer comment: Pt self-cues for LE management and use of UEs to self assist    Ambulation/Gait Ambulation/Gait assistance: Contact guard assist, Supervision Gait Distance (Feet): 48 Feet Assistive device: Rolling walker (2 wheels) Gait Pattern/deviations: Step-to pattern, Shuffle, Trunk flexed Gait velocity: decreased     General Gait Details: min cues for sequence, posture and position from RW; distance ltd by fatigue but with no c/o dizziness   Stairs Stairs: Yes Stairs  assistance: Min assist Stair Management: No rails, Step to pattern, With walker, Backwards, Forwards Number of Stairs: 2 General stair comments: single step twice - once fwd and once bkwd - with RW and cues for sequence   Wheelchair Mobility     Tilt Bed    Modified Rankin (Stroke Patients Only)       Balance Overall balance assessment: Needs assistance Sitting-balance support: Feet supported Sitting balance-Leahy Scale: Good     Standing balance support: During functional activity, Reliant on assistive device for balance, Single extremity supported Standing balance-Leahy Scale: Poor                              Communication Communication Communication: No apparent difficulties  Cognition Arousal: Alert Behavior During Therapy: WFL for tasks assessed/performed   PT - Cognitive impairments: No apparent impairments                         Following commands: Intact      Cueing Cueing Techniques: Verbal cues  Exercises Total Joint Exercises Ankle Circles/Pumps: AROM, Both, 10 reps Quad Sets: AROM, Both, 10 reps, Supine Heel Slides: AAROM, Right, Supine, 15 reps Straight Leg Raises: AAROM, Right, Supine, 15 reps Long Arc Quad: AAROM, Right, 15 reps, Seated Goniometric ROM: AAROM at  R knee 0 - 65    General Comments        Pertinent Vitals/Pain Pain Assessment Pain Assessment: 0-10 Pain Score: 7  Pain Location: R LE and knee Pain Descriptors /  Indicators: Aching, Grimacing, Sore Pain Intervention(s): Limited activity within patient's tolerance, Monitored during session, Premedicated before session, Ice applied    Home Living                          Prior Function            PT Goals (current goals can now be found in the care plan section) Acute Rehab PT Goals Patient Stated Goal: to be able to take care of my 3.5 yo grandchild PT Goal Formulation: With patient Time For Goal Achievement: 01/08/24 Potential to  Achieve Goals: Good Progress towards PT goals: Progressing toward goals    Frequency    7X/week      PT Plan      Co-evaluation              AM-PAC PT 6 Clicks Mobility   Outcome Measure  Help needed turning from your back to your side while in a flat bed without using bedrails?: A Little Help needed moving from lying on your back to sitting on the side of a flat bed without using bedrails?: A Little Help needed moving to and from a bed to a chair (including a wheelchair)?: A Little Help needed standing up from a chair using your arms (e.g., wheelchair or bedside chair)?: A Little Help needed to walk in hospital room?: A Little Help needed climbing 3-5 steps with a railing? : A Little 6 Click Score: 18    End of Session Equipment Utilized During Treatment: Gait belt Activity Tolerance: Patient tolerated treatment well Patient left: in chair;with call bell/phone within reach;with chair alarm set Nurse Communication: Mobility status PT Visit Diagnosis: Unsteadiness on feet (R26.81);Other abnormalities of gait and mobility (R26.89);Muscle weakness (generalized) (M62.81);Pain;Difficulty in walking, not elsewhere classified (R26.2) Pain - Right/Left: Right Pain - part of body: Knee;Leg     Time: 1450-1525 PT Time Calculation (min) (ACUTE ONLY): 35 min  Charges:    $Therapeutic Exercise: 23-37 mins PT General Charges $$ ACUTE PT VISIT: 1 Visit                     [facility] PT Acute Rehabilitation Services Office [phone]    Nyheem Binette 12/27/2023, 4:57 PM

## 2023-12-27 NOTE — Consult Note (Signed)
 Initial Consultation Note   Patient: Brittany [name] FMW:[id number] DOB: 02-14-1956 PCP: Ozell Heron [name], MD DOA: 12/25/2023 DOS: the patient was seen and examined on 12/27/2023 Primary service: Duwayne Purchase, MD  Referring physician: Purchase Duwayne, MD. Reason for consult: Hyponatremia.  Assessment/Plan: Principal Problem:   Right knee DJD Fluid restriction to 12 mL/day. Check serum osmolality. Check urine sodium and osmolality. Follow-up sodium level in AM. Discussed decreased water intake with the patient.  Active Problems:   GERD Continue famotidine  20 mg p.o. daily. Continue pantoprazole  40 mg p.o. twice daily.    Chronic PTSD  Continue buspirone  50 mg p.o. 3 times daily. Continue clonazepam  as needed.    Hyperlipidemia Continue ezetimibe  10 mg p.o. daily.    Class 2 obesity Current BMI 36.27 kg/m. Would benefit from lifestyle modifications. Follow-up closely with PCP.SABRA    Hypophosphatemia Replacing orally.    Normocytic anemia Follow hematocrit and hemoglobin.    Hyperglycemia Check fasting glucose and hemoglobin A1c in AM.   TRH will continue to follow the patient.  HPI: Brittany [name] is a 68 y.o. female with past medical history of normal EKG, seasonal allergies, asthma, COPD, anxiety, bipolar disorder, depression, PTSD, panic disorder, CAD, gallstones, GERD, heart murmur, hyperlipidemia, insomnia, lower extremity edema, obesity, osteoporosis, history of COVID-19 pneumonia, unspecified tachycardia who 2 days ago underwent a right TKA with Dr. Baird and we are consulting due to hyponatremia.  She states she drinks a lot of water at home.  She has been drinking about 2 to 3 quarts while in the hospital.  She also stated that she does not have high sodium intake. He denied fever, chills, rhinorrhea, sore throat, wheezing or hemoptysis.  No chest pain, palpitations, diaphoresis, PND, orthopnea or pitting edema of the lower extremities.  No abdominal pain, nausea,  emesis, diarrhea, constipation, melena or hematochezia.  No flank pain, dysuria, frequency or hematuria.  No polyuria, polydipsia, polyphagia or blurred vision.   Review of Systems: As mentioned in the history of present illness. All other systems reviewed and are negative. Past Medical History:  Diagnosis Date   Abnormal EKG    Allergy    Anxiety    Arthritis    Asthma    Bipolar depression (HCC), Anxiety, PTSD, Panic disorder    -managed by [facility]   CAD (coronary artery disease)    COPD (chronic obstructive pulmonary disease) (HCC)    Depression    Foot pain    Gallstones    GERD (gastroesophageal reflux disease)    Heart murmur    as a child   HLD (hyperlipidemia)    Insomnia    Leg swelling    Obesity    Osteoporosis    osteopenia   Pneumonia due to COVID-19 virus 04/22/2019   Tachycardia    Past Surgical History:  Procedure Laterality Date   BREAST BIOPSY Right    biopsy of nipple   CARPAL TUNNEL RELEASE Left    CESAREAN SECTION     x 2   CHOLECYSTECTOMY     COLONOSCOPY     ENDOMETRIAL ABLATION     EXCISION MORTON'S NEUROMA Left 01/11/2022   Procedure: SECOND WEBSPACE MORTON'S NEUROMA EXCISION;  Surgeon: Kit Rush, MD;  Location: [facility];  Service: Orthopedics;  Laterality: Left;   FOOT SURGERY Left    HAMMER TOE SURGERY Left 01/11/2022   Procedure: COLLATERAL LIGAMENT REPAIRS WITH SECOND AND THIRD HAMMER TOE CORRECTION;  Surgeon: Kit Rush, MD;  Location: [facility];  Service: Orthopedics;  Laterality: Left;   KNEE SURGERY Right    reconstruction for patellar dislocation   LAPAROSCOPY     x 2   TOENAIL EXCISION Left 01/11/2022   Procedure: HALLUX TOENAIL PERMANENT EXCISION;  Surgeon: Kit Rush, MD;  Location: [facility];  Service: Orthopedics;  Laterality: Left;   TOTAL KNEE ARTHROPLASTY Right 12/25/2023   Procedure: ARTHROPLASTY, KNEE, TOTAL;  Surgeon: Duwayne Purchase, MD;  Location: [facility];   Service: Orthopedics;  Laterality: Right;   TUBAL LIGATION     ULNAR NERVE REPAIR Left    UPPER GASTROINTESTINAL ENDOSCOPY     WEIL OSTEOTOMY Left 01/11/2022   Procedure: SECOND, THIRD AND FOURTH WEIL OSTEOTOMIES;  Surgeon: Kit Rush, MD;  Location: [facility];  Service: Orthopedics;  Laterality: Left;   Social History:  reports that she quit smoking about 24 years ago. Her smoking use included cigarettes. She started smoking about 25 years ago. She has a 20 pack-year smoking history. She has never used smokeless tobacco. She reports current alcohol use. She reports that she does not use drugs.  Allergies  Allergen Reactions   Aspirin  Other (See Comments)    discomfort   Atorvastatin  Other (See Comments)    Patient states she felt suicidal   Codeine Itching   Crestor  [Rosuvastatin ] Other (See Comments)    Dizzy, leg pain and states she felt suicidal   Gabapentin  Other (See Comments)   Niacin And Related Itching   Sulfa Antibiotics Other (See Comments)    Other reaction(s): Unknown   Sulfamethoxazole-Trimethoprim Other (See Comments)    Unknown reaction per pt   Topiramate  Other (See Comments)    Per patient, it causes stomach burning   Trileptal  [Oxcarbazepine ] Itching    itching   Trimethoprim Other (See Comments)    Unknown, per pt   Ultram [Tramadol] Itching   Vioxx [Rofecoxib] Other (See Comments)    Unknown per pt    Family History  Problem Relation Age of Onset   Hypertension Mother    Hyperlipidemia Mother    Congestive Heart Failure Mother    Other Father        killed   COPD Brother    Heart disease Brother    Pulmonary embolism Brother    Colon polyps Maternal Aunt    COPD Paternal Aunt        a lot of aunts and uncle COPD or Emphysema   Emphysema Paternal Uncle    Breast cancer Daughter    Colon cancer Neg Hx    Esophageal cancer Neg Hx    Pancreatic cancer Neg Hx    Stomach cancer Neg Hx    Liver disease Neg Hx    Rectal cancer Neg  Hx     Prior to Admission medications   Medication Sig Start Date End Date Taking? Authorizing Provider  acetaminophen  (TYLENOL ) 500 MG tablet Take 500-1,000 mg by mouth every 6 (six) hours as needed (pain.).   Yes [provider]  albuterol  (PROVENTIL ) (2.5 MG/3ML) 0.083% nebulizer solution Take 3 mLs (2.5 mg total) by nebulization every 6 (six) hours as needed for wheezing or shortness of breath. 11/15/22  Yes Kara Dorn NOVAK, MD  Albuterol  Sulfate (PROAIR  RESPICLICK) 108 (90 Base) MCG/ACT AEPB Inhale 2 puffs into the lungs every 6 (six) hours as needed. 05/27/23  Yes Kara Dorn NOVAK, MD  aluminum hydroxide-magnesium  carbonate (GAVISCON ) 95-358 MG/15ML SUSP Take 15-30 mLs by mouth as needed. 12/26/23  Yes Duwayne Purchase,  MD  aspirin  EC 81 MG tablet Take 1 tablet (81 mg total) by mouth in the morning and at bedtime. Swallow whole. 12/26/23  Yes Duwayne Purchase, MD  Azelastine  HCl 137 MCG/SPRAY SOLN SPRAY 1 SPRAY IN EACH NOSTRIL TWICE DAILY 09/26/23  Yes Kara Dorn NOVAK, MD  busPIRone  (BUSPAR ) 15 MG tablet Take 1 tablet (15 mg total) by mouth 3 (three) times daily. 10/15/23  Yes Cottle, Lorene KANDICE Raddle., MD  cetirizine  (ZYRTEC ) 10 MG tablet Take 1 tablet (10 mg total) by mouth daily. 07/20/19  Yes Gretta Leita SQUIBB, DO  Cholecalciferol (VITAMIN D -3 PO) Take 5,000 Units by mouth daily.   Yes [provider]  clonazePAM  (KLONOPIN ) 0.5 MG tablet TAKE 1 TABLET BY MOUTH 2 TIMES A DAY AND TAKE TWO TABLETS BY MOUTH AT BEDTIME Patient taking differently: Taking 1/2 tablet  BID and 2 tablets at night 10/15/23  Yes Cottle, Lorene KANDICE Raddle., MD  dicyclomine  (BENTYL ) 10 MG capsule Take 1 capsule (10 mg total) by mouth as needed for spasms. 12/04/23  Yes Federico Rosario BROCKS, MD  docusate sodium  (COLACE) 100 MG capsule Take 1 capsule (100 mg total) by mouth 2 (two) times daily. 12/26/23 12/25/24 Yes Bissell, Jaclyn M, PA-C  doxazosin  (CARDURA ) 4 MG tablet Take 2 tablets (8 mg total) by mouth at bedtime. 12/17/23  Yes  Cottle, Lorene KANDICE Raddle., MD  Evolocumab  (REPATHA  SURECLICK) 140 MG/ML SOAJ Inject 140 mg into the skin every 14 (fourteen) days. 09/11/23  Yes Delford Maude BROCKS, MD  ezetimibe  (ZETIA ) 10 MG tablet TAKE 1 TABLET BY MOUTH DAILY 10/31/23  Yes Nishan, Peter C, MD  famotidine  (PEPCID ) 20 MG tablet Take 1 tablet (20 mg total) by mouth at bedtime. 07/02/23  Yes May, Deanna J, NP  fluticasone  (FLONASE ) 50 MCG/ACT nasal spray USE 2 SPRAYS INTO BOTH NOSTRILS TWICE DAILY Patient taking differently: Place 2 sprays into both nostrils in the morning and at bedtime. 08/19/23  Yes Kara Dorn NOVAK, MD  fluticasone -salmeterol (ADVAIR) 250-50 MCG/ACT AEPB Inhale 1 puff into the lungs every 12 (twelve) hours. 05/27/23  Yes Kara Dorn NOVAK, MD  HYDROmorphone  (DILAUDID ) 2 MG tablet Take 1-2 tablets (2-4 mg total) by mouth every 4 (four) hours as needed for severe pain (pain score 7-10). 12/26/23  Yes Bissell, Jaclyn M, PA-C  ibuprofen (ADVIL) 200 MG tablet Take 200-400 mg by mouth every 8 (eight) hours as needed (pain.).   Yes [provider]  methocarbamol  (ROBAXIN ) 500 MG tablet Take 1 tablet (500 mg total) by mouth every 8 (eight) hours as needed for muscle spasms. 12/26/23  Yes Bissell, Jaclyn M, PA-C  mirtazapine  (REMERON ) 45 MG tablet Take 1 tablet (45 mg total) by mouth at bedtime. 12/17/23  Yes Cottle, Lorene KANDICE Raddle., MD  montelukast  (SINGULAIR ) 10 MG tablet TAKE 1 TABLET BY MOUTH AT BEDTIME 10/31/23  Yes Kara Dorn NOVAK, MD  Multiple Vitamin (MULTIVITAMIN WITH MINERALS) TABS tablet Take 1 tablet by mouth daily with breakfast.   Yes [provider]  Omega-3 Fatty Acids (OMEGA 3 PO) Take 1 capsule by mouth daily. 06/19/23  Yes [provider]  pantoprazole  (PROTONIX ) 40 MG tablet Take 1 tablet (40 mg total) by mouth 2 (two) times daily. Take 30 min to 1 hr before morning coffee and dinner. 12/04/23  Yes Federico Rosario BROCKS, MD  polyethylene glycol (MIRALAX  / GLYCOLAX ) 17 g packet Take 17 g by mouth daily.  12/26/23  Yes Bissell, Jaclyn M, PA-C  Scar Treatment Products (SCAR EX) Apply 1  Application topically as needed (bruises).   Yes [provider]  sodium chloride  (OCEAN) 0.65 % SOLN nasal spray Place 1 spray into both nostrils in the morning and at bedtime.   Yes [provider]  Vitamin D -Vitamin K (D3 + K2 PO) Take 1 tablet by mouth daily.   Yes [provider]  Ascorbic Acid  (VITA-C PO) Take 1 tablet by mouth daily.    [provider]  famotidine  (PEPCID ) 20 MG tablet Take 1 tablet (20 mg total) by mouth 2 (two) times daily. 12/26/23   Duwayne Purchase, MD  Ferrous Sulfate  (IRON PO) Take 1 tablet by mouth daily.    [provider]  MAGNESIUM  PO Take 1 tablet by mouth daily.    [provider]    Physical Exam: Vitals:   12/26/23 1324 12/26/23 2008 12/26/23 2105 12/27/23 0554  BP: (!) 142/83  119/71 (!) 153/78  Pulse: (!) 101  (!) 102 100  Resp: 18  16 17   Temp: 98.3 F (36.8 C)  99.3 F (37.4 C) 98.5 F (36.9 C)  TempSrc: Oral  Oral Oral  SpO2: 94% 95% 99% 99%  Weight:      Height:       Physical Exam Vitals reviewed.  Constitutional:      General: She is awake. She is not in acute distress.    Appearance: She is obese. She is ill-appearing.  HENT:     Head: Normocephalic.     Nose: No rhinorrhea.     Mouth/Throat:     Mouth: Mucous membranes are moist.  Eyes:     General: No scleral icterus.    Pupils: Pupils are equal, round, and reactive to light.  Neck:     Vascular: No JVD.  Cardiovascular:     Rate and Rhythm: Normal rate and regular rhythm.     Heart sounds: S1 normal and S2 normal.     Comments: RLE edema is secondary to surgical procedure. Pulmonary:     Effort: Pulmonary effort is normal.     Breath sounds: Normal breath sounds. No wheezing, rhonchi or rales.  Abdominal:     General: Bowel sounds are normal. There is no distension.     Palpations: Abdomen is soft.     Tenderness: There is no abdominal  tenderness. There is no right CVA tenderness or left CVA tenderness.  Musculoskeletal:     Cervical back: Neck supple.     Right lower leg: Edema present.     Left lower leg: No edema.     Comments: Dressing in place.  Skin:    General: Skin is warm and dry.  Neurological:     General: No focal deficit present.     Mental Status: She is alert and oriented to person, place, and time.  Psychiatric:        Mood and Affect: Mood normal.        Behavior: Behavior normal. Behavior is cooperative.    Data Reviewed:   Results are pending, will review when available.   Family Communication:  Primary team communication:  Thank you very much for involving us  in the care of your patient.  Author: Alm Dorn Castor, MD 12/27/2023 8:17 AM  For on call review [email or web address].   This document was prepared using Dragon voice recognition software and may contain some unintended transcription errors.

## 2023-12-27 NOTE — Plan of Care (Signed)
 Problem: Education: Goal: Knowledge of General Education information will improve Description: Including pain rating scale, medication(s)/side effects and non-pharmacologic comfort measures Outcome: Progressing   Problem: Clinical Measurements: Goal: Ability to maintain clinical measurements within normal limits will improve Outcome: Progressing   Problem: Activity: Goal: Risk for activity intolerance will decrease Outcome: Progressing   Problem: Coping: Goal: Level of anxiety will decrease Outcome: Progressing   Problem: Pain Managment: Goal: General experience of comfort will improve and/or be controlled Outcome: Progressing   Jon LULLA Reins, RN 12/27/23 9:50 AM

## 2023-12-28 ENCOUNTER — Other Ambulatory Visit (HOSPITAL_COMMUNITY): Payer: Self-pay

## 2023-12-28 DIAGNOSIS — M1711 Unilateral primary osteoarthritis, right knee: Secondary | ICD-10-CM

## 2023-12-28 LAB — COMPREHENSIVE METABOLIC PANEL WITH GFR
ALT: 10 U/L (ref 0–44)
AST: 12 U/L — ABNORMAL LOW (ref 15–41)
Albumin: 3.1 g/dL — ABNORMAL LOW (ref 3.5–5.0)
Alkaline Phosphatase: 43 U/L (ref 38–126)
Anion gap: 7 (ref 5–15)
BUN: 11 mg/dL (ref 8–23)
CO2: 33 mmol/L — ABNORMAL HIGH (ref 22–32)
Calcium: 8.4 mg/dL — ABNORMAL LOW (ref 8.9–10.3)
Chloride: 98 mmol/L (ref 98–111)
Creatinine, Ser: 0.57 mg/dL (ref 0.44–1.00)
GFR, Estimated: >60 mL/min (ref >60)
Glucose, Bld: 128 mg/dL — ABNORMAL HIGH (ref 70–99)
Potassium: 3.6 mmol/L (ref 3.5–5.1)
Sodium: 138 mmol/L (ref 135–145)
Total Bilirubin: 0.5 mg/dL (ref 0.0–1.2)
Total Protein: 6 g/dL — ABNORMAL LOW (ref 6.5–8.1)

## 2023-12-28 LAB — CBC
HCT: 31.6 % — ABNORMAL LOW (ref 36.0–46.0)
Hemoglobin: 10.4 g/dL — ABNORMAL LOW (ref 12.0–15.0)
MCH: 30.7 pg (ref 26.0–34.0)
MCHC: 32.9 g/dL (ref 30.0–36.0)
MCV: 93.2 fL (ref 80.0–100.0)
Platelets: 234 K/uL (ref 150–400)
RBC: 3.39 MIL/uL — ABNORMAL LOW (ref 3.87–5.11)
RDW: 13.2 % (ref 11.5–15.5)
WBC: 5.7 K/uL (ref 4.0–10.5)
nRBC: 0 % (ref 0.0–0.2)

## 2023-12-28 NOTE — Progress Notes (Signed)
 Subjective: 3 Days Post-Op Procedure(s) (LRB): ARTHROPLASTY, KNEE, TOTAL (Right) Patient reports pain as controlled.   Reports pain about the knee. Hoping to go home today. On aspirin .  Objective: Vital signs in last 24 hours: Temp:  [98 F (36.7 C)-98.6 F (37 C)] 98 F (36.7 C) (07/12 0616) Pulse Rate:  [92-106] 99 (07/12 0616) Resp:  [18-19] 18 (07/12 0616) BP: (116-143)/(64-67) 137/64 (07/12 0616) SpO2:  [93 %-100 %] 100 % (07/12 0616)  Intake/Output from previous day: 07/11 0701 - 07/12 0700 In: 540 [P.O.:540] Out: 600 [Urine:600] Intake/Output this shift: No intake/output data recorded.  Recent Labs    12/26/23 0941 12/27/23 0340 12/28/23 0353  HGB 12.6 11.7* 10.4*   Recent Labs    12/27/23 0340 12/28/23 0353  WBC 7.3 5.7  RBC 3.80* 3.39*  HCT 34.5* 31.6*  PLT 237 234   Recent Labs    12/27/23 1318 12/28/23 0353  NA 128* 138  K 4.4 3.6  CL 91* 98  CO2 28 33*  BUN 9 11  CREATININE 0.57 0.57  GLUCOSE 174* 128*  CALCIUM  8.7* 8.4*   No results for input(s): LABPT, INR in the last 72 hours.  Neurologically intact ABD soft Neurovascular intact Sensation intact distally Intact pulses distally Dorsiflexion/Plantar flexion intact Incision: dressing C/D/I and no drainage No cellulitis present Compartment soft No calf pain or sign of DVT   Assessment/Plan: 3 Days Post-Op Procedure(s) (LRB): ARTHROPLASTY, KNEE, TOTAL (Right) Advance diet Up with therapy D/C IV fluids DC home today pending PT Dilaudid  out of stock at [facility] pharmacy - sending to [facility] pharmacy to pick up prior to d/c   Lillia Mountain 12/28/2023, 8:06 AM

## 2023-12-28 NOTE — Progress Notes (Signed)
 Physical Therapy Treatment Patient Details Name: Brittany [name] MRN: [id number] DOB: 08-26-1955 Today's Date: 12/28/2023   History of Present Illness 68 yo female presents to therapy s/p R TKA on 12/25/2023 due to failure of conservative measures. Pt PMH includes but is not limited to: anxiety, arthritis, asthma, bipolar and panic disorder, CAD, depression, gallstones, GERD, HLD, B LE edema, and L foot surgeries.    PT Comments  Pt continues motivated and progressing well with mobility.  Pt up to ambulate increased distance, negotiated stairs, performed HEP and with noted decreased assist level for all tasks.  Pt eager for return home this date.    If plan is discharge home, recommend the following: A little help with walking and/or transfers;A little help with bathing/dressing/bathroom;Assistance with cooking/housework;Help with stairs or ramp for entrance;Assist for transportation   Can travel by private vehicle        Equipment Recommendations  Rolling walker (2 wheels)    Recommendations for Other Services       Precautions / Restrictions Precautions Precautions: Fall;Knee Restrictions Weight Bearing Restrictions Per Provider Order: No     Mobility  Bed Mobility               General bed mobility comments: Pt up in chair and returns to same    Transfers Overall transfer level: Needs assistance Equipment used: Rolling walker (2 wheels) Transfers: Sit to/from Stand Sit to Stand: Modified independent (Device/Increase time)           General transfer comment: Pt self-cues for LE management and use of UEs to self assist    Ambulation/Gait Ambulation/Gait assistance: Supervision Gait Distance (Feet): 130 Feet Assistive device: Rolling walker (2 wheels) Gait Pattern/deviations: Step-to pattern, Shuffle, Trunk flexed Gait velocity: decreased     General Gait Details: min cues for sequence, posture and position from RW   Stairs Stairs: Yes Stairs  assistance: Min assist Stair Management: No rails, Step to pattern, With walker, Backwards, Forwards Number of Stairs: 2 General stair comments: single step twice - once fwd and once bkwd - with RW and cues for sequence   Wheelchair Mobility     Tilt Bed    Modified Rankin (Stroke Patients Only)       Balance Overall balance assessment: Needs assistance Sitting-balance support: Feet supported Sitting balance-Leahy Scale: Good     Standing balance support: No upper extremity supported Standing balance-Leahy Scale: Fair                              Hotel manager: No apparent difficulties  Cognition Arousal: Alert Behavior During Therapy: WFL for tasks assessed/performed   PT - Cognitive impairments: No apparent impairments                         Following commands: Intact      Cueing Cueing Techniques: Verbal cues  Exercises Total Joint Exercises Ankle Circles/Pumps: AROM, Both, 10 reps Quad Sets: AROM, Both, 10 reps, Supine Heel Slides: AAROM, Right, Supine, 15 reps Straight Leg Raises: AAROM, Right, Supine, 15 reps Long Arc Quad: AAROM, Right, 15 reps, Seated    General Comments        Pertinent Vitals/Pain Pain Assessment Pain Assessment: 0-10 Pain Score: 5  Pain Location: R LE and knee Pain Descriptors / Indicators: Aching, Grimacing, Sore Pain Intervention(s): Limited activity within patient's tolerance, Monitored during session, Premedicated before session, Ice applied  Home Living                          Prior Function            PT Goals (current goals can now be found in the care plan section) Acute Rehab PT Goals Patient Stated Goal: to be able to take care of my 3.5 yo grandchild PT Goal Formulation: With patient Time For Goal Achievement: 01/08/24 Potential to Achieve Goals: Good Progress towards PT goals: Progressing toward goals    Frequency    7X/week      PT  Plan      Co-evaluation              AM-PAC PT 6 Clicks Mobility   Outcome Measure  Help needed turning from your back to your side while in a flat bed without using bedrails?: A Little Help needed moving from lying on your back to sitting on the side of a flat bed without using bedrails?: A Little Help needed moving to and from a bed to a chair (including a wheelchair)?: A Little Help needed standing up from a chair using your arms (e.g., wheelchair or bedside chair)?: None Help needed to walk in hospital room?: A Little Help needed climbing 3-5 steps with a railing? : A Little 6 Click Score: 19    End of Session Equipment Utilized During Treatment: Gait belt Activity Tolerance: Patient tolerated treatment well Patient left: in chair;with call bell/phone within reach;with chair alarm set;with nursing/sitter in room Nurse Communication: Mobility status PT Visit Diagnosis: Unsteadiness on feet (R26.81);Other abnormalities of gait and mobility (R26.89);Muscle weakness (generalized) (M62.81);Pain;Difficulty in walking, not elsewhere classified (R26.2) Pain - Right/Left: Right Pain - part of body: Knee;Leg     Time: 9054-8982 PT Time Calculation (min) (ACUTE ONLY): 32 min  Charges:    $Gait Training: 8-22 mins $Therapeutic Exercise: 23-37 mins PT General Charges $$ ACUTE PT VISIT: 1 Visit                     [facility] PT Acute Rehabilitation Services Office [phone]    Shawnice Tilmon 12/28/2023, 11:41 AM

## 2023-12-28 NOTE — Plan of Care (Signed)
 Patient discharged via private vehicle with daughter. AVS and discharge instruction provided. Patient verbalizes understanding of education including but not limited to site care, pain management, DVT prevention, and bowel management. Jon LULLA Reins, RN 12/28/23 12:40 PM

## 2023-12-28 NOTE — Progress Notes (Signed)
 TOC med (hydromorphone ) in a secure bag delivered to pt in room. Pt states it causes her some itching, denies hives or SOB. No adverse reaction noted this am per primary nurse.

## 2023-12-28 NOTE — Progress Notes (Signed)
 PROGRESS NOTE  Brittany [name] FMW:[id number] DOB: 08-Nov-1955 DOA: 12/25/2023 PCP: Brittany Heron [name], MD   LOS: 0 days   Brief narrative:  Brittany [name] is a 68 y.o. female with past medical history of asthma, COPD, anxiety, bipolar disorder, depression, PTSD, panic disorder, CAD, GERD, hyperlipidemia, obesity,  unspecified tachycardia underwent right total knee arthroplasty by orthopedics and medical team was consulted for hyponatremia.  Patient stated that she had been drinking  a lot of water at home without electrolytes..     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  Further management as per primary team.  Hyponatremia.  Sodium level today at 138.  Initial sodium level at 129.    Has improved.  Urinary sodium less than 10 and osmolality serum at 276.  Advised to cut down on free water ingestion at home.     GERD Continue Pepcid  and pantoprazole .    Chronic PTSD  Continue BuSpar  and Klonopin      Hyperlipidemia Continue Zetia       Class 2 obesity Current BMI 36.27 kg/m. Would benefit from continued weight loss as outpatient.     Hypophosphatemia Replenished orally.  Check levels in AM.     Normocytic anemia Hemoglobin 10.4.  Continue to monitor.     Hyperglycemia Hemoglobin A1c pending.  Fasting blood glucose level at 128.   DVT prophylaxis: SCDs Start: 12/25/23 1226 Place TED hose Start: 12/25/23 1226   Disposition: medically stable for disposition.  Status is: Observation    Code Status:     Code Status: Full Code  Family Communication: none    Anti-infectives (From admission, onward)    Start     Dose/Rate Route Frequency Ordered Stop   12/25/23 1400  ceFAZolin  (ANCEF ) IVPB 2g/100 mL premix        2 g 200  mL/hr over 30 Minutes Intravenous Every 6 hours 12/25/23 1225 12/25/23 2131   12/25/23 0630  ceFAZolin  (ANCEF ) IVPB 2g/100 mL premix        2 g 200 mL/hr over 30 Minutes Intravenous On call to O.R. 12/25/23 0630 12/25/23 0851        Subjective: Today, patient was seen and examined at bedside.  Patient denies any dizziness lightheadedness nausea vomiting fever chills or rigor.  Objective: Vitals:   12/28/23 0616 12/28/23 0842  BP: 137/64   Pulse: 99   Resp: 18   Temp: 98 F (36.7 C)   SpO2: 100% 96%    Intake/Output Summary (Last 24 hours) at 12/28/2023 1104 Last data filed at 12/28/2023 0600 Gross per 24 hour  Intake 240 ml  Output 600 ml  Net -360 ml   Filed Weights   12/25/23 2200  Weight: 86.4 kg   Body mass index is 36.27 kg/m.   Physical Exam: GENERAL: Patient is alert awake and oriented. Not in obvious distress.  Obese built HENT: No scleral pallor or icterus. Pupils equally reactive to light. Oral mucosa is moist NECK: is supple, no gross swelling noted. CHEST: Clear to auscultation. No crackles or wheezes.   CVS: S1 and S2 heard, no murmur. Regular rate and rhythm.  ABDOMEN: Soft, non-tender, bowel sounds are present. EXTREMITIES: Right knee with dressing and edema. CNS: Cranial nerves are intact. No focal motor  deficits. SKIN: warm and dry without rashes.  Data Review: I have personally reviewed the following laboratory data and studies,  CBC: Recent Labs  Lab 12/26/23 0941 12/27/23 0340 12/28/23 0353  WBC 7.9 7.3 5.7  NEUTROABS 6.1  --   --   HGB 12.6 11.7* 10.4*  HCT 37.1 34.5* 31.6*  MCV 92.3 90.8 93.2  PLT 228 237 234   Basic Metabolic Panel: Recent Labs  Lab 12/26/23 0941 12/27/23 0340 12/27/23 1318 12/28/23 0353  NA 129* 129* 128* 138  K 3.7 3.9 4.4 3.6  CL 97* 95* 91* 98  CO2 24 26 28  33*  GLUCOSE 132* 132* 174* 128*  BUN 14 10 9 11   CREATININE 0.56 0.50 0.57 0.57  CALCIUM  8.1* 8.4* 8.7* 8.4*  MG  --  2.0  --   --   PHOS  --   2.2*  --   --    Liver Function Tests: Recent Labs  Lab 12/28/23 0353  AST 12*  ALT 10  ALKPHOS 43  BILITOT 0.5  PROT 6.0*  ALBUMIN 3.1*   No results for input(s): LIPASE, AMYLASE in the last 168 hours. No results for input(s): AMMONIA in the last 168 hours. Cardiac Enzymes: No results for input(s): CKTOTAL, CKMB, CKMBINDEX, TROPONINI in the last 168 hours. BNP (last 3 results) No results for input(s): BNP in the last 8760 hours.  ProBNP (last 3 results) No results for input(s): PROBNP in the last 8760 hours.  CBG: No results for input(s): GLUCAP in the last 168 hours. No results found for this or any previous visit (from the past 240 hours).    Studies: No results found.    Brittain Hosie, MD  [facility] 12/28/2023  If 7PM-7AM, please contact night-coverage

## 2023-12-28 NOTE — Hospital Course (Signed)
 Brittany [name] is a 68 y.o. female with past medical history of asthma, COPD, anxiety, bipolar disorder, depression, PTSD, panic disorder, CAD, GERD, hyperlipidemia, obesity,  unspecified tachycardia underwent right total knee arthroplasty by orthopedics and medical team was consulted for hyponatremia.  Patient stated that she had been drinking  a lot of water at home.   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  Further management as per primary team.  Hyponatremia.  Sodium level today at 138.  Initial sodium level at 129.  On fluid restriction.  Has improved.  Urinary sodium less than 10 and osmolality serum at 276.     GERD Continue Pepcid  and pantoprazole .    Chronic PTSD  Continue BuSpar  and Klonopin      Hyperlipidemia Continue Zetia       Class 2 obesity Current BMI 36.27 kg/m. Would benefit from continued weight loss as outpatient.     Hypophosphatemia Replenished orally.  Check levels in AM.     Normocytic anemia Hemoglobin 10.4.  Continue to monitor.     Hyperglycemia Hemoglobin A1c pending.  Fasting blood glucose level at 128.

## 2023-12-29 DIAGNOSIS — Z79899 Other long term (current) drug therapy: Secondary | ICD-10-CM | POA: Diagnosis not present

## 2023-12-29 DIAGNOSIS — F410 Panic disorder [episodic paroxysmal anxiety] without agoraphobia: Secondary | ICD-10-CM | POA: Diagnosis not present

## 2023-12-29 DIAGNOSIS — K8020 Calculus of gallbladder without cholecystitis without obstruction: Secondary | ICD-10-CM | POA: Diagnosis not present

## 2023-12-29 DIAGNOSIS — K219 Gastro-esophageal reflux disease without esophagitis: Secondary | ICD-10-CM | POA: Diagnosis not present

## 2023-12-29 DIAGNOSIS — M810 Age-related osteoporosis without current pathological fracture: Secondary | ICD-10-CM | POA: Diagnosis not present

## 2023-12-29 DIAGNOSIS — Z8616 Personal history of COVID-19: Secondary | ICD-10-CM | POA: Diagnosis not present

## 2023-12-29 DIAGNOSIS — I2510 Atherosclerotic heart disease of native coronary artery without angina pectoris: Secondary | ICD-10-CM | POA: Diagnosis not present

## 2023-12-29 DIAGNOSIS — F3130 Bipolar disorder, current episode depressed, mild or moderate severity, unspecified: Secondary | ICD-10-CM | POA: Diagnosis not present

## 2023-12-29 DIAGNOSIS — E785 Hyperlipidemia, unspecified: Secondary | ICD-10-CM | POA: Diagnosis not present

## 2023-12-29 DIAGNOSIS — Z87891 Personal history of nicotine dependence: Secondary | ICD-10-CM | POA: Diagnosis not present

## 2023-12-29 DIAGNOSIS — Z6837 Body mass index (BMI) 37.0-37.9, adult: Secondary | ICD-10-CM | POA: Diagnosis not present

## 2023-12-29 DIAGNOSIS — Z7951 Long term (current) use of inhaled steroids: Secondary | ICD-10-CM | POA: Diagnosis not present

## 2023-12-29 DIAGNOSIS — Z7982 Long term (current) use of aspirin: Secondary | ICD-10-CM | POA: Diagnosis not present

## 2023-12-29 DIAGNOSIS — G4700 Insomnia, unspecified: Secondary | ICD-10-CM | POA: Diagnosis not present

## 2023-12-29 DIAGNOSIS — F4312 Post-traumatic stress disorder, chronic: Secondary | ICD-10-CM | POA: Diagnosis not present

## 2023-12-29 DIAGNOSIS — J4489 Other specified chronic obstructive pulmonary disease: Secondary | ICD-10-CM | POA: Diagnosis not present

## 2023-12-29 DIAGNOSIS — Z8701 Personal history of pneumonia (recurrent): Secondary | ICD-10-CM | POA: Diagnosis not present

## 2023-12-29 DIAGNOSIS — Z96651 Presence of right artificial knee joint: Secondary | ICD-10-CM | POA: Diagnosis not present

## 2023-12-29 DIAGNOSIS — Z471 Aftercare following joint replacement surgery: Secondary | ICD-10-CM | POA: Diagnosis not present

## 2023-12-29 DIAGNOSIS — Z556 Problems related to health literacy: Secondary | ICD-10-CM | POA: Diagnosis not present

## 2023-12-29 DIAGNOSIS — R32 Unspecified urinary incontinence: Secondary | ICD-10-CM | POA: Diagnosis not present

## 2023-12-30 ENCOUNTER — Ambulatory Visit: Payer: Self-pay | Admitting: Family Medicine

## 2023-12-30 LAB — HEMOGLOBIN A1C
Hgb A1c MFr Bld: 4.9 % (ref 4.8–5.6)
Mean Plasma Glucose: 94 mg/dL

## 2023-12-31 ENCOUNTER — Other Ambulatory Visit (HOSPITAL_COMMUNITY): Payer: Self-pay

## 2024-01-03 ENCOUNTER — Encounter: Payer: Self-pay | Admitting: Advanced Practice Midwife

## 2024-01-03 ENCOUNTER — Ambulatory Visit: Admitting: Psychiatry

## 2024-01-03 DIAGNOSIS — F3162 Bipolar disorder, current episode mixed, moderate: Secondary | ICD-10-CM

## 2024-01-03 NOTE — Progress Notes (Signed)
 [facility] Counselor/Therapist Progress Note  Patient ID: Brittany [name], MRN: [id number],    Date: 01/03/2024  Time Spent: 53 minutes (Telehealth Telephone visit as patient not able to do the Video this soon after surgery)  Treatment Type: Individual Therapy  Virtual Visit via Telehealth Note: Telephone Visit, due to recent surgery is unable to do video visit today Connected with patient by a telemedicine/telehealth application, with their informed consent, and verified patient privacy and that I am speaking with the correct person using two identifiers. I discussed the limitations, risks, security and privacy concerns of performing psychotherapy and the availability of in person appointments. I also discussed with the patient that there may be a patient responsible charge related to this service. The patient expressed understanding and agreed to proceed. I discussed the treatment planning with the patient. The patient was provided an opportunity to ask questions and all were answered. The patient agreed with the plan and demonstrated an understanding of the instructions. The patient was advised to call  our office if  symptoms worsen or feel they are in a crisis state and need immediate contact.   Therapist Location: office Patient Location: home  Reported Symptoms:   anxiety, irritable off the charts , recovering from knee replacement surgery, some loneliness, trust issues, sensitivity to rejection due to my PTSD from years ago and is better, perceived rejection, overthinking, relationship concerns   Mental Status Exam:  Appearance:   N/A  session is by telehealth (phone)     Behavior:  Appropriate, Sharing, and Motivated  Motor:  Affected by recent surgery  Speech/Language:   Clear and Coherent  Affect:  N/a ; session is telehealth (phone)  Mood:  anxious and irritable  Thought process:  goal directed  Thought content:    Rumination  Sensory/Perceptual disturbances:     WNL  Orientation:  oriented to person, place, time/date, situation, day of week, month of year, year, and stated date of January 03, 2024  Attention:  Good  Concentration:  Good  Memory:  WNL  Fund of knowledge:   Good  Insight:    Good and Fair  Judgment:   Good  Impulse Control:  Good   Risk Assessment: Danger to Self:  No Self-injurious Behavior: No Danger to Others: No Duty to Warn:no Physical Aggression / Violence:No  Access to Firearms a concern: No  Gang Involvement:No   Subjective:  Patient today having session virtually as she has.  Recently had knee replacement surgery. Getting PT at home and making some progress in her stretching and range of motion. Some issues with a friend re: being more responsive to patient. Working further today on some interpersonal issues, self-esteem, worrying, negatives, doubts about others, trying not to second-guess people, letting go of negative assumptions, and trying to see the positives vs negatives. Encouraged to not jump to conclusions and trying to decrease her overthinking. Continues to work on not overstressing, while trying to be more patient with herself and other people.  He is feeling very encouraged with her progress thus far after recent knee replacement surgery.  Interventions: Cognitive Behavioral Therapy, Solution-Oriented/Positive Psychology, and Ego-Supportive  1) Long Term Goal: Develop and follow through on strategies to reduce symptoms.     Short Term Goal: Reduce anxiety and improve coping skills.     Objective: Learn two new ways of coping with daily stressors.     Objective: Develop strategies for thought distraction when fixating on the future.  Objective: Manage panic episodes. 2) Long Term Goal: Maintain stability of mood     Short Term Goal: Increase ability to manage moods.     Objective: Discuss and resolve troubling personal and interpersonal issues.     Diagnosis:   ICD-10-CM   1. Bipolar 1 disorder,  mixed, moderate (HCC)  F31.62      Plan:    Patient today working well in session despite having had recent knee replacement surgery.  She is doing a little better than imagined postsurgically and that is also helping her mood.  Has people that are regularly in contact with her in a supportive manner.  Some continued work today on her self-esteem, bipolar, healthy boundaries in relationships, try not to assume the negatives, worrying, and doubts.  Continues working on trying not to assume negatives and instead trying to be more mindful of positives.  Patient does feel that she is making progress on her therapy goals and I agree.  This past week has been a little more challenging due to having her knee replacement surgery and she is grateful to be doing as well as she is thus far.  She continues to work on Engineer, agricultural, self-esteem, difficulty trusting others, and expectations of relationships, and noting some progress.  Reminded and encouraged patient in her goal-directed behaviors relating to being less negative in her thought patterns, particularly for now be more self understanding as she has just recently had knee replacement surgery and that presents some challenges for her, practice better limit setting with other people who are not healthy for her, recognize her overthinking more quickly and work to interrupt it, try reducing frequent thoughts of how things have to be in order to be okay, more regularly practice the pause, working on healthier interaction skills including boundaries, let her faith be a resource of emotional support and stability as well as spiritual, recognize her strengths and not just her challenges, understand that her anxious thoughts are just thoughts and are not necessarily going to play out in reality the way she might assume or fear.  Patient continues today with being more open and having the ability to see some thoughts and behaviors that she is needing to work on  without taking it so personally or critically.  Brittany [name] is making progress and she needs to continue her work with goal-directed behaviors to help move her in a more hopeful and healthier direction going forward.  Goal review and progress/challenges noted with patient.  Next appointment within 2 weeks.   Barnie Bunde, LCSW

## 2024-01-09 DIAGNOSIS — Z96651 Presence of right artificial knee joint: Secondary | ICD-10-CM | POA: Diagnosis not present

## 2024-01-09 DIAGNOSIS — Z471 Aftercare following joint replacement surgery: Secondary | ICD-10-CM | POA: Diagnosis not present

## 2024-01-14 ENCOUNTER — Ambulatory Visit: Admitting: Pulmonary Disease

## 2024-01-14 ENCOUNTER — Encounter: Payer: Self-pay | Admitting: Pulmonary Disease

## 2024-01-14 VITALS — BP 103/67 | HR 100 | Temp 97.8°F | Ht 63.0 in | Wt 188.8 lb

## 2024-01-14 DIAGNOSIS — J4540 Moderate persistent asthma, uncomplicated: Secondary | ICD-10-CM

## 2024-01-14 MED ORDER — AZITHROMYCIN 250 MG PO TABS: ORAL_TABLET | ORAL | 0 refills | Status: DC

## 2024-01-14 MED ORDER — PREDNISONE 10 MG PO TABS: 40.0000 mg | ORAL_TABLET | Freq: Every day | ORAL | 0 refills | Status: DC

## 2024-01-14 NOTE — Patient Instructions (Addendum)
 Continue advair inhaler 1 puff twice daily - rinse mouth out after each use  Continue albuterol  inhaler or nebulizer treatments every 4-6 hours as needed  Start prednisone  40mg  daily for 5 days  Start Zpak daily for 5 days  Hopefully this allows you to stop using the albuterol  nebulizer twice daily and go back to as needed  Follow up in 1 year, call sooner if needed

## 2024-01-14 NOTE — Progress Notes (Signed)
 Synopsis: Referred in 2018 for asthma by Ozell Heron HERO, MD.  Previously patient of Dr. Darlean and Dr. Corrie.  Subjective:   PATIENT ID: Brittany [name] GENDER: female DOB: 1955/10/04, MRN: [id number]  No chief complaint on file.  HPI Brittany [name] is a 68 year old woman, former smoker with covid 19 pneumonia in 2020 and asthma who returns to pulmonary clinic for follow up.   Three weeks post-knee replacement surgery, she experiences increased respiratory symptoms. She uses her nebulizer twice daily for throat congestion and occasional loss of voice. Albuterol  inhaler use has increased, particularly when active outside. She continues Advair, one puff twice daily, and was switched to Breo in the hospital. She uses Flonase  and azelastine  for sinus issues and takes Benadryl  for medication-related itching. Sinus drainage and congestion persist, affecting her lungs, with occasional wheezing. She is concerned about the increased need for her nebulizer and inhaler compared to her pre-surgery routine.  OV 10/19/22 Her breathing has been doing ok, she reports increased nebulizer use this spring but no need for prednisone  taper.   She complains of 3-4 days of right sided pressure headache and facial pressure concerning for sinus infection  OV 05/23/22 She was treated for asthma exacerbation in 04/06/22 with azithromycin  and depomedrol injection.  She has been doing ok since her exacerbation.  [employer] is not covering symbicort  this coming year but still qualifies for patient assistance.   OV 11/15/21 She has done well since last visit. She is using symbicort  160-4.16mcg 2 puffs twice daily and as needed albuterol .   No issues with allergies currently. She remains on zyrtec , singulair , flonase  and astelin .   Constant post nasal drainage and congestion in her throat  Lab Results  Component Value Date   NITRICOXIDE 14 07/16/2016    Past Medical History:  Diagnosis Date   Abnormal EKG     Allergy    Anxiety    Arthritis    Asthma    Bipolar depression (HCC), Anxiety, PTSD, Panic disorder    -managed by [facility]   CAD (coronary artery disease)    COPD (chronic obstructive pulmonary disease) (HCC)    Depression    Foot pain    Gallstones    GERD (gastroesophageal reflux disease)    Heart murmur    as a child   HLD (hyperlipidemia)    Insomnia    Leg swelling    Obesity    Osteoporosis    osteopenia   Pneumonia due to COVID-19 virus 04/22/2019   Tachycardia      Family History  Problem Relation Age of Onset   Hypertension Mother    Hyperlipidemia Mother    Congestive Heart Failure Mother    Other Father        killed   COPD Brother    Heart disease Brother    Pulmonary embolism Brother    Colon polyps Maternal Aunt    COPD Paternal Aunt        a lot of aunts and uncle COPD or Emphysema   Emphysema Paternal Uncle    Breast cancer Daughter    Colon cancer Neg Hx    Esophageal cancer Neg Hx    Pancreatic cancer Neg Hx    Stomach cancer Neg Hx    Liver disease Neg Hx    Rectal cancer Neg Hx      Past Surgical History:  Procedure Laterality Date   BREAST BIOPSY Right    biopsy of nipple   CARPAL  TUNNEL RELEASE Left    CESAREAN SECTION     x 2   CHOLECYSTECTOMY     COLONOSCOPY     ENDOMETRIAL ABLATION     EXCISION MORTON'S NEUROMA Left 01/11/2022   Procedure: SECOND WEBSPACE MORTON'S NEUROMA EXCISION;  Surgeon: Kit Rush, MD;  Location: [facility];  Service: Orthopedics;  Laterality: Left;   FOOT SURGERY Left    HAMMER TOE SURGERY Left 01/11/2022   Procedure: COLLATERAL LIGAMENT REPAIRS WITH SECOND AND THIRD HAMMER TOE CORRECTION;  Surgeon: Kit Rush, MD;  Location: [facility];  Service: Orthopedics;  Laterality: Left;   KNEE SURGERY Right    reconstruction for patellar dislocation   LAPAROSCOPY     x 2   TOENAIL EXCISION Left 01/11/2022   Procedure: HALLUX TOENAIL PERMANENT EXCISION;  Surgeon:  Kit Rush, MD;  Location: [facility];  Service: Orthopedics;  Laterality: Left;   TOTAL KNEE ARTHROPLASTY Right 12/25/2023   Procedure: ARTHROPLASTY, KNEE, TOTAL;  Surgeon: Duwayne Purchase, MD;  Location: [facility];  Service: Orthopedics;  Laterality: Right;   TUBAL LIGATION     ULNAR NERVE REPAIR Left    UPPER GASTROINTESTINAL ENDOSCOPY     WEIL OSTEOTOMY Left 01/11/2022   Procedure: SECOND, THIRD AND FOURTH WEIL OSTEOTOMIES;  Surgeon: Kit Rush, MD;  Location: [facility];  Service: Orthopedics;  Laterality: Left;    Social History   Socioeconomic History   Marital status: Single    Spouse name: Not on file   Number of children: 3   Years of education: Not on file   Highest education level: Associate degree: occupational, Scientist, product/process development, or vocational program  Occupational History   Occupation: disabled  Tobacco Use   Smoking status: Former    Current packs/day: 0.00    Average packs/day: 20.0 packs/day for 1 year (20.0 ttl pk-yrs)    Types: Cigarettes    Start date: 06/18/1998    Quit date: 06/19/1999    Years since quitting: 24.5   Smokeless tobacco: Never   Tobacco comments:    20 year estimate but probably less   Vaping Use   Vaping status: Never Used  Substance and Sexual Activity   Alcohol use: Yes    Comment: Rare   Drug use: No   Sexual activity: Never  Other Topics Concern   Not on file  Social History Narrative   Work or School: Disabled seconndary to psychiatric conditions      Home Situation: lives alone with 2 cats and one dog      Spiritual Beliefs: Christian      Lifestyle: no regular exercise, diet  Not great - wants to embark on healthier lifestyle   Social Drivers of Health   Financial Resource Strain: Medium Risk (12/01/2023)   Overall Financial Resource Strain (CARDIA)    Difficulty of Paying Living Expenses: Somewhat hard  Food Insecurity: Patient Declined (12/25/2023)   Hunger Vital Sign    Worried About Running Out of  Food in the Last Year: Patient declined    Ran Out of Food in the Last Year: Patient declined  Recent Concern: Food Insecurity - Food Insecurity Present (12/01/2023)   Hunger Vital Sign    Worried About Running Out of Food in the Last Year: Sometimes true    Ran Out of Food in the Last Year: Sometimes true  Transportation Needs: No Transportation Needs (12/25/2023)   PRAPARE - Administrator, Civil Service (Medical): No    Lack  of Transportation (Non-Medical): No  Physical Activity: Insufficiently Active (12/01/2023)   Exercise Vital Sign    Days of Exercise per Week: 2 days    Minutes of Exercise per Session: 30 min  Stress: Stress Concern Present (12/01/2023)   [employer] of Occupational Health - Occupational Stress Questionnaire    Feeling of Stress: To some extent  Social Connections: Moderately Integrated (12/25/2023)   Social Connection and Isolation Panel    Frequency of Communication with Friends and Family: Three times a week    Frequency of Social Gatherings with Friends and Family: Once a week    Attends Religious Services: More than 4 times per year    Active Member of [employer] or Organizations: No    Attends Banker Meetings: 1 to 4 times per year    Marital Status: Divorced  Recent Concern: Social Connections - Moderately Isolated (12/01/2023)   Social Connection and Isolation Panel    Frequency of Communication with Friends and Family: Three times a week    Frequency of Social Gatherings with Friends and Family: Once a week    Attends Religious Services: More than 4 times per year    Active Member of Clubs or Organizations: No    Attends Banker Meetings: Not on file    Marital Status: Divorced  Intimate Partner Violence: Not At Risk (12/25/2023)   Humiliation, Afraid, Rape, and Kick questionnaire    Fear of Current or Ex-Partner: No    Emotionally Abused: No    Physically Abused: No    Sexually Abused: No     Allergies  Allergen  Reactions   Aspirin  Other (See Comments)    discomfort   Atorvastatin  Other (See Comments)    Patient states she felt suicidal   Codeine Itching   Crestor  [Rosuvastatin ] Other (See Comments)    Dizzy, leg pain and states she felt suicidal   Gabapentin  Other (See Comments)   Niacin And Related Itching   Sulfa Antibiotics Other (See Comments)    Other reaction(s): Unknown   Sulfamethoxazole-Trimethoprim Other (See Comments)    Unknown reaction per pt   Topiramate  Other (See Comments)    Per patient, it causes stomach burning   Trileptal  [Oxcarbazepine ] Itching    itching   Trimethoprim Other (See Comments)    Unknown, per pt   Ultram [Tramadol] Itching   Vioxx [Rofecoxib] Other (See Comments)    Unknown per pt     Immunization History  Administered Date(s) Administered   Fluad Quad(high Dose 65+) 02/28/2021   Influenza Split 04/26/2011   Influenza Whole 03/18/2009, 03/18/2010, 03/18/2016   Influenza,inj,Quad PF,6+ Mos 04/19/2014, 02/28/2017, 03/06/2018, 02/13/2019, 03/04/2020   Influenza-Unspecified 02/22/2022, 02/28/2023   PFIZER(Purple Top)SARS-COV-2 Vaccination 09/04/2019, 09/29/2019, 04/06/2020, 04/09/2021, 02/27/2023   PNEUMOCOCCAL CONJUGATE-20 05/10/2021   Pfizer Covid Bivalent Pediatric Vaccine(53mos to <66yrs) 03/17/2022   Pneumococcal Conjugate-13 03/10/2015   Pneumococcal Polysaccharide-23 06/18/2005   Respiratory Syncytial Virus Vaccine,Recomb Aduvanted(Arexvy) 03/08/2022   Tdap 12/04/2016   Zoster Recombinant(Shingrix) 01/01/2018, 06/22/2018    Outpatient Medications Prior to Visit  Medication Sig Dispense Refill   acetaminophen  (TYLENOL ) 500 MG tablet Take 500-1,000 mg by mouth every 6 (six) hours as needed (pain.).     albuterol  (PROVENTIL ) (2.5 MG/3ML) 0.083% nebulizer solution Take 3 mLs (2.5 mg total) by nebulization every 6 (six) hours as needed for wheezing or shortness of breath. 75 mL 12   Albuterol  Sulfate (PROAIR  RESPICLICK) 108 (90 Base) MCG/ACT AEPB  Inhale 2 puffs into the lungs every 6 (  six) hours as needed. 1 each 5   aluminum hydroxide-magnesium  carbonate (GAVISCON ) 95-358 MG/15ML SUSP Take 15-30 mLs by mouth as needed. 355 mL 0   Ascorbic Acid  (VITA-C PO) Take 1 tablet by mouth daily.     aspirin  EC 81 MG tablet Take 1 tablet (81 mg total) by mouth in the morning and at bedtime. Swallow whole. 60 tablet 0   Azelastine  HCl 137 MCG/SPRAY SOLN SPRAY 1 SPRAY IN EACH NOSTRIL TWICE DAILY 30 mL 5   busPIRone  (BUSPAR ) 15 MG tablet Take 1 tablet (15 mg total) by mouth 3 (three) times daily. 270 tablet 1   cetirizine  (ZYRTEC ) 10 MG tablet Take 1 tablet (10 mg total) by mouth daily. 90 tablet 1   Cholecalciferol (VITAMIN D -3 PO) Take 5,000 Units by mouth daily.     clonazePAM  (KLONOPIN ) 0.5 MG tablet TAKE 1 TABLET BY MOUTH 2 TIMES A DAY AND TAKE TWO TABLETS BY MOUTH AT BEDTIME (Patient taking differently: Taking 1/2 tablet  BID and 2 tablets at night) 360 tablet 0   dicyclomine  (BENTYL ) 10 MG capsule Take 1 capsule (10 mg total) by mouth as needed for spasms. 90 capsule 2   docusate sodium  (COLACE) 100 MG capsule Take 1 capsule (100 mg total) by mouth 2 (two) times daily. 60 capsule 2   doxazosin  (CARDURA ) 4 MG tablet Take 2 tablets (8 mg total) by mouth at bedtime. 180 tablet 0   Evolocumab  (REPATHA  SURECLICK) 140 MG/ML SOAJ Inject 140 mg into the skin every 14 (fourteen) days. 6 mL 3   ezetimibe  (ZETIA ) 10 MG tablet TAKE 1 TABLET BY MOUTH DAILY 90 tablet 1   famotidine  (PEPCID ) 20 MG tablet Take 1 tablet (20 mg total) by mouth 2 (two) times daily. 60 tablet 0   Ferrous Sulfate  (IRON PO) Take 1 tablet by mouth daily.     fluticasone  (FLONASE ) 50 MCG/ACT nasal spray USE 2 SPRAYS INTO BOTH NOSTRILS TWICE DAILY (Patient taking differently: Place 2 sprays into both nostrils in the morning and at bedtime.) 96 mL 2   fluticasone -salmeterol (ADVAIR) 250-50 MCG/ACT AEPB Inhale 1 puff into the lungs every 12 (twelve) hours. 180 each 1    HYDROcodone-acetaminophen  (NORCO) 10-325 MG tablet Take 1 tablet every 4 hours by oral route as needed for 7 days.     HYDROmorphone  (DILAUDID ) 2 MG tablet Take 1-2 tablets (2-4 mg total) by mouth every 4 (four) hours as needed for severe pain (pain score 7-10). 40 tablet 0   ibuprofen (ADVIL) 200 MG tablet Take 200-400 mg by mouth every 8 (eight) hours as needed (pain.).     MAGNESIUM  PO Take 1 tablet by mouth daily.     methocarbamol  (ROBAXIN ) 500 MG tablet Take 1 tablet (500 mg total) by mouth every 8 (eight) hours as needed for muscle spasms. 30 tablet 1   mirtazapine  (REMERON ) 45 MG tablet Take 1 tablet (45 mg total) by mouth at bedtime. 90 tablet 1   montelukast  (SINGULAIR ) 10 MG tablet TAKE 1 TABLET BY MOUTH AT BEDTIME 100 tablet 0   Multiple Vitamin (MULTIVITAMIN WITH MINERALS) TABS tablet Take 1 tablet by mouth daily with breakfast.     Omega-3 Fatty Acids (OMEGA 3 PO) Take 1 capsule by mouth daily.     pantoprazole  (PROTONIX ) 40 MG tablet Take 1 tablet (40 mg total) by mouth 2 (two) times daily. Take 30 min to 1 hr before morning coffee and dinner. 90 tablet 3   polyethylene glycol (MIRALAX  / GLYCOLAX ) 17 g packet  Take 17 g by mouth daily. 14 each 0   Scar Treatment Products (SCAR EX) Apply 1 Application topically as needed (bruises).     sodium chloride  (OCEAN) 0.65 % SOLN nasal spray Place 1 spray into both nostrils in the morning and at bedtime.     Vitamin D -Vitamin K (D3 + K2 PO) Take 1 tablet by mouth daily.     No facility-administered medications prior to visit.    Review of Systems  Constitutional:  Negative for chills, diaphoresis, fever, malaise/fatigue and weight loss.  HENT:  Positive for congestion. Negative for nosebleeds and sinus pain.   Eyes: Negative.   Respiratory:  Positive for cough, shortness of breath and wheezing. Negative for hemoptysis and sputum production.   Cardiovascular:  Negative for chest pain, palpitations, orthopnea, claudication and leg swelling.   Gastrointestinal:  Negative for heartburn and nausea.  Genitourinary: Negative.   Musculoskeletal:  Negative for joint pain and myalgias.  Neurological:  Negative for dizziness, weakness and headaches.  Psychiatric/Behavioral: Negative.      Objective:   Vitals:   01/14/24 1135  BP: 103/67  Pulse: 100  Temp: 97.8 F (36.6 C)  TempSrc: Temporal  SpO2: 94%  Weight: 188 lb 12.8 oz (85.6 kg)  Height: 5' 3 (1.6 m)   94% on  RA BMI Readings from Last 3 Encounters:  01/14/24 33.44 kg/m  12/25/23 36.27 kg/m  12/18/23 36.27 kg/m   Wt Readings from Last 3 Encounters:  01/14/24 188 lb 12.8 oz (85.6 kg)  12/25/23 190 lb 6.4 oz (86.4 kg)  12/18/23 190 lb 6.4 oz (86.4 kg)    Physical Exam Vitals reviewed.  Constitutional:      General: She is not in acute distress.    Appearance: She is obese. She is not ill-appearing.  HENT:     Head: Normocephalic and atraumatic.  Eyes:     General: No scleral icterus. Cardiovascular:     Rate and Rhythm: Normal rate and regular rhythm.     Heart sounds: No murmur heard. Pulmonary:     Effort: Pulmonary effort is normal.     Breath sounds: Normal breath sounds. No wheezing, rhonchi or rales.  Musculoskeletal:     Cervical back: Neck supple.     Right lower leg: No edema.     Left lower leg: No edema.  Skin:    General: Skin is warm and dry.     Findings: No rash.  Neurological:     Mental Status: She is alert.    CBC    Component Value Date/Time   WBC 5.7 12/28/2023 0353   RBC 3.39 (L) 12/28/2023 0353   HGB 10.4 (L) 12/28/2023 0353   HCT 31.6 (L) 12/28/2023 0353   PLT 234 12/28/2023 0353   MCV 93.2 12/28/2023 0353   MCH 30.7 12/28/2023 0353   MCHC 32.9 12/28/2023 0353   RDW 13.2 12/28/2023 0353   LYMPHSABS 0.8 12/26/2023 0941   MONOABS 0.9 12/26/2023 0941   EOSABS 0.0 12/26/2023 0941   BASOSABS 0.0 12/26/2023 0941   Chest Imaging- films reviewed: HRCT Chest 03/2020  Moderate patchy ground glass opacity,  reticulation and septal thickening throughout both lungs with associated mild traction bronchiectasis and arcitectural distortion, with an upper lobe predominance, asymmetrically prominent on the left. No frank honeycombing. Previously noted acute opacities on 04/22/2019 chest CTA have improved/resolved while the fibrotic components are new.   CXR, 2 view 05/12/2019-left greater than right opacities and reticulation.  Possible posterior pleural effusion silhouetting hemidiaphragm  CTA chest 04/22/2019-left greater than right peripheral groundglass opacities.  No PE.  CXR 02/11/2020-increased interstitial markings throughout  Echocardiogram 01/26/20: LVEF 60 to 65%, no regional wall motion abnormalities.  Mild concentric LVH, moderate basal septal hypertrophy.  Normal LA.  RV with mildly reduced function, normal size.  Normal RA.  Normal valves.  Pulmonary Functions Testing Results:    Latest Ref Rng & Units 10/25/2020    9:43 AM 09/03/2019    8:41 AM  PFT Results  FVC-Pre L 2.74  2.55   FVC-Predicted Pre % 93  86   FVC-Post L 2.90  2.71   FVC-Predicted Post % 99  91   Pre FEV1/FVC % % 70  70   Post FEV1/FCV % % 69  67   FEV1-Pre L 1.92  1.78   FEV1-Predicted Pre % 85  78   FEV1-Post L 2.00  1.83   DLCO uncorrected ml/min/mmHg 19.95  16.97   DLCO UNC% % 107  91   DLCO corrected ml/min/mmHg 19.95  16.97   DLCO COR %Predicted % 107  91   DLVA Predicted % 105  98   TLC L 5.00  4.48   TLC % Predicted % 105  94   RV % Predicted % 105  99    2021- mild obstruction, no bronchodilator reversibility.  No restriction, hyperinflation, or air trapping.  Normal diffusion.  2022 - mild obstruction present.   Assessment & Plan:     ICD-10-CM   1. 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  - PFTs without restrictive or diffsuion defects in 08/2019 and remain stable in 2022  Allergic rhinosinusitis -Continue Zyrtec  - flonase  daily  - continue azelastine  nasal spray daily - try saline nasal rinses daily - take Zpak for 5 days and 5 days of prednisone   Follow up in 1 year, call sooner if needed  Dorn Chill, MD [facility] Office: [phone]     Current Outpatient Medications:    acetaminophen  (TYLENOL ) 500 MG tablet, Take 500-1,000 mg by mouth every 6 (six) hours as needed (pain.)., Disp: , Rfl:    albuterol  (PROVENTIL ) (2.5 MG/3ML) 0.083% nebulizer solution, Take 3 mLs (2.5 mg total) by nebulization every 6 (six) hours as needed for wheezing or shortness of breath., Disp: 75 mL, Rfl: 12   Albuterol  Sulfate (PROAIR  RESPICLICK) 108 (90 Base) MCG/ACT AEPB, Inhale 2 puffs into the lungs every 6 (six) hours as needed., Disp: 1 each, Rfl: 5   aluminum hydroxide-magnesium  carbonate (GAVISCON ) 95-358 MG/15ML SUSP, Take 15-30 mLs by mouth as needed., Disp: 355 mL, Rfl: 0   Ascorbic Acid  (VITA-C PO), Take 1 tablet by mouth daily., Disp: , Rfl:    aspirin  EC 81 MG tablet, Take 1 tablet (81 mg total) by mouth in the morning and at bedtime. Swallow whole., Disp: 60 tablet, Rfl: 0   Azelastine  HCl 137 MCG/SPRAY SOLN, SPRAY 1 SPRAY IN EACH NOSTRIL TWICE DAILY, Disp: 30 mL, Rfl: 5   azithromycin  (ZITHROMAX ) 250 MG tablet, Take as directed, Disp: 6 tablet, Rfl: 0   busPIRone  (BUSPAR ) 15 MG tablet, Take 1 tablet (15 mg total) by mouth 3 (three) times daily., Disp: 270  tablet, Rfl: 1   cetirizine  (ZYRTEC ) 10 MG tablet, Take 1 tablet (10 mg total) by mouth daily., Disp: 90 tablet, Rfl: 1   Cholecalciferol (VITAMIN D -3 PO), Take 5,000 Units by mouth daily., Disp: , Rfl:    clonazePAM  (KLONOPIN ) 0.5 MG tablet, TAKE 1 TABLET BY MOUTH 2 TIMES A DAY AND TAKE TWO TABLETS BY MOUTH  AT BEDTIME (Patient taking differently: Taking 1/2 tablet  BID and 2 tablets at night), Disp: 360 tablet, Rfl: 0   dicyclomine  (BENTYL ) 10 MG capsule, Take 1 capsule (10 mg total) by mouth as needed for spasms., Disp: 90 capsule, Rfl: 2   docusate sodium  (COLACE) 100 MG capsule, Take 1 capsule (100 mg total) by mouth 2 (two) times daily., Disp: 60 capsule, Rfl: 2   doxazosin  (CARDURA ) 4 MG tablet, Take 2 tablets (8 mg total) by mouth at bedtime., Disp: 180 tablet, Rfl: 0   Evolocumab  (REPATHA  SURECLICK) 140 MG/ML SOAJ, Inject 140 mg into the skin every 14 (fourteen) days., Disp: 6 mL, Rfl: 3   ezetimibe  (ZETIA ) 10 MG tablet, TAKE 1 TABLET BY MOUTH DAILY, Disp: 90 tablet, Rfl: 1   famotidine  (PEPCID ) 20 MG tablet, Take 1 tablet (20 mg total) by mouth 2 (two) times daily., Disp: 60 tablet, Rfl: 0   Ferrous Sulfate  (IRON PO), Take 1 tablet by mouth daily., Disp: , Rfl:    fluticasone  (FLONASE ) 50 MCG/ACT nasal spray, USE 2 SPRAYS INTO BOTH NOSTRILS TWICE DAILY (Patient taking differently: Place 2 sprays into both nostrils in the morning and at bedtime.), Disp: 96 mL, Rfl: 2   fluticasone -salmeterol (ADVAIR) 250-50 MCG/ACT AEPB, Inhale 1 puff into the lungs every 12 (twelve) hours., Disp: 180 each, Rfl: 1   HYDROcodone-acetaminophen  (NORCO) 10-325 MG tablet, Take 1 tablet every 4 hours by oral route as needed for 7 days., Disp: , Rfl:    HYDROmorphone  (DILAUDID ) 2 MG tablet, Take 1-2 tablets (2-4 mg total) by mouth every 4 (four) hours as needed for severe pain (pain score 7-10)., Disp: 40 tablet, Rfl: 0   ibuprofen (ADVIL) 200 MG tablet, Take 200-400 mg by mouth every 8 (eight) hours as needed (pain.)., Disp: , Rfl:    MAGNESIUM  PO, Take 1 tablet by mouth daily., Disp: , Rfl:    methocarbamol  (ROBAXIN ) 500 MG tablet, Take 1 tablet (500 mg total) by mouth every 8 (eight) hours as needed for muscle spasms., Disp: 30 tablet, Rfl: 1   mirtazapine  (REMERON ) 45 MG tablet, Take 1 tablet (45 mg total) by mouth  at bedtime., Disp: 90 tablet, Rfl: 1   montelukast  (SINGULAIR ) 10 MG tablet, TAKE 1 TABLET BY MOUTH AT BEDTIME, Disp: 100 tablet, Rfl: 0   Multiple Vitamin (MULTIVITAMIN WITH MINERALS) TABS tablet, Take 1 tablet by mouth daily with breakfast., Disp: , Rfl:    Omega-3 Fatty Acids (OMEGA 3 PO), Take 1 capsule by mouth daily., Disp: , Rfl:    pantoprazole  (PROTONIX ) 40 MG tablet, Take 1 tablet (40 mg total) by mouth 2 (two) times daily. Take 30 min to 1 hr before morning coffee and dinner., Disp: 90 tablet, Rfl: 3   polyethylene glycol (MIRALAX  / GLYCOLAX ) 17 g packet, Take 17 g by mouth daily., Disp: 14 each, Rfl: 0   predniSONE  (DELTASONE ) 10 MG tablet, Take 4 tablets (40 mg total) by mouth daily with breakfast., Disp: 20 tablet, Rfl: 0   Scar Treatment Products (SCAR EX), Apply 1 Application topically as needed (bruises)., Disp: , Rfl:    sodium chloride  (OCEAN) 0.65 %  SOLN nasal spray, Place 1 spray into both nostrils in the morning and at bedtime., Disp: , Rfl:    Vitamin D -Vitamin K (D3 + K2 PO), Take 1 tablet by mouth daily., Disp: , Rfl:

## 2024-01-14 NOTE — Discharge Summary (Signed)
 Physician Discharge Summary   Patient ID: Brittany [name] MRN: [id number] DOB/AGE: January 27, 1956 69 y.o.  Admit date: 12/25/2023 Discharge date: 12/28/2023  Primary Diagnosis: right knee primary osteoarthritis  Admission Diagnoses:  Past Medical History:  Diagnosis Date   Abnormal EKG    Allergy    Anxiety    Arthritis    Asthma    Bipolar depression (HCC), Anxiety, PTSD, Panic disorder    -managed by [facility]   CAD (coronary artery disease)    COPD (chronic obstructive pulmonary disease) (HCC)    Depression    Foot pain    Gallstones    GERD (gastroesophageal reflux disease)    Heart murmur    as a child   HLD (hyperlipidemia)    Insomnia    Leg swelling    Obesity    Osteoporosis    osteopenia   Pneumonia due to COVID-19 virus 04/22/2019   Tachycardia    Discharge Diagnoses:   Principal Problem:   Right knee DJD Active Problems:   GERD   Chronic post-traumatic stress disorder (PTSD)   Hyperlipidemia   Class 2 obesity   Hypophosphatemia   Normocytic anemia   Hyperglycemia  Estimated body mass index is 36.27 kg/m as calculated from the following:   Height as of this encounter: 5' 0.75 (1.543 m).   Weight as of this encounter: 86.4 kg.  Procedure:  Procedure(s) (LRB): ARTHROPLASTY, KNEE, TOTAL (Right)   Consults: hospitalist  HPI: see pre-op H&P Laboratory Data: Admission on 12/25/2023, Discharged on 12/28/2023  Component Date Value Ref Range Status   WBC 12/26/2023 7.9  4.0 - 10.5 K/uL Final   RBC 12/26/2023 4.02  3.87 - 5.11 MIL/uL Final   Hemoglobin 12/26/2023 12.6  12.0 - 15.0 g/dL Final   HCT 92/89/7974 37.1  36.0 - 46.0 % Final   MCV 12/26/2023 92.3  80.0 - 100.0 fL Final   MCH 12/26/2023 31.3  26.0 - 34.0 pg Final   MCHC 12/26/2023 34.0  30.0 - 36.0 g/dL Final   RDW 92/89/7974 12.9  11.5 - 15.5 % Final   Platelets 12/26/2023 228  150 - 400 K/uL Final   nRBC 12/26/2023 0.0  0.0 - 0.2 % Final   Neutrophils Relative % 12/26/2023 78   % Final   Neutro Abs 12/26/2023 6.1  1.7 - 7.7 K/uL Final   Lymphocytes Relative 12/26/2023 10  % Final   Lymphs Abs 12/26/2023 0.8  0.7 - 4.0 K/uL Final   Monocytes Relative 12/26/2023 12  % Final   Monocytes Absolute 12/26/2023 0.9  0.1 - 1.0 K/uL Final   Eosinophils Relative 12/26/2023 0  % Final   Eosinophils Absolute 12/26/2023 0.0  0.0 - 0.5 K/uL Final   Basophils Relative 12/26/2023 0  % Final   Basophils Absolute 12/26/2023 0.0  0.0 - 0.1 K/uL Final   Immature Granulocytes 12/26/2023 0  % Final   Abs Immature Granulocytes 12/26/2023 0.02  0.00 - 0.07 K/uL Final   Performed at [facility], [address], [place], [place] [address]   Sodium 12/26/2023 129 (L)  135 - 145 mmol/L Final   Potassium 12/26/2023 3.7  3.5 - 5.1 mmol/L Final   Chloride 12/26/2023 97 (L)  98 - 111 mmol/L Final   CO2 12/26/2023 24  22 - 32 mmol/L Final   Glucose, Bld 12/26/2023 132 (H)  70 - 99 mg/dL Final   Glucose reference range applies only to samples taken after fasting for at least 8 hours.  BUN 12/26/2023 14  8 - 23 mg/dL Final   Creatinine, Ser 12/26/2023 0.56  0.44 - 1.00 mg/dL Final   Calcium  12/26/2023 8.1 (L)  8.9 - 10.3 mg/dL Final   GFR, Estimated 12/26/2023 >60  >60 mL/min Final   Comment: (NOTE) Calculated using the CKD-EPI Creatinine Equation (2021)    Anion gap 12/26/2023 8  5 - 15 Final   Performed at [facility], [address], [place], [place] [address]   WBC 12/27/2023 7.3  4.0 - 10.5 K/uL Final   RBC 12/27/2023 3.80 (L)  3.87 - 5.11 MIL/uL Final   Hemoglobin 12/27/2023 11.7 (L)  12.0 - 15.0 g/dL Final   HCT 92/88/7974 34.5 (L)  36.0 - 46.0 % Final   MCV 12/27/2023 90.8  80.0 - 100.0 fL Final   MCH 12/27/2023 30.8  26.0 - 34.0 pg Final   MCHC 12/27/2023 33.9  30.0 - 36.0 g/dL Final   RDW 92/88/7974 13.0  11.5 - 15.5 % Final   Platelets 12/27/2023 237  150 - 400 K/uL Final   nRBC 12/27/2023 0.0  0.0 - 0.2 % Final   Performed at [facility], [address], [place], [place] [address]   Sodium 12/27/2023 129 (L)  135 - 145 mmol/L Final   Potassium 12/27/2023 3.9  3.5 - 5.1 mmol/L Final   Chloride 12/27/2023 95 (L)  98 - 111 mmol/L Final   CO2 12/27/2023 26  22 - 32 mmol/L Final   Glucose, Bld 12/27/2023 132 (H)  70 - 99 mg/dL Final   Glucose reference range applies only to samples taken after fasting for at least 8 hours.   BUN 12/27/2023 10  8 - 23 mg/dL Final   Creatinine, Ser 12/27/2023 0.50  0.44 - 1.00 mg/dL Final   Calcium  12/27/2023 8.4 (L)  8.9 - 10.3 mg/dL Final   GFR, Estimated 12/27/2023 >60  >60 mL/min Final   Comment: (NOTE) Calculated using the CKD-EPI Creatinine Equation (2021)    Anion gap 12/27/2023 8  5 - 15 Final   Performed at [facility], [address], [place], [place] [address]   Osmolality, Ur 12/27/2023 540  300 - 900 mOsm/kg Final   Comment: REPEATED TO VERIFY Performed at [facility], [address], [place], [place] [address]    Osmolality 12/27/2023 276  275 - 295 mOsm/kg Final   Performed at [facility], [address], [place], [place] [address]   Sodium, Ur 12/27/2023 <10  mmol/L Final   Performed at [facility], [address] [place], [place] [address]   Magnesium  12/27/2023 2.0  1.7 - 2.4 mg/dL Final   Performed at [facility], [address], [place], [place] [address]   Phosphorus 12/27/2023 2.2 (L)  2.5 - 4.6 mg/dL Final   Performed at [facility], [address] [place], [place] [address]   Color, Urine 12/27/2023 YELLOW  YELLOW Final   APPearance 12/27/2023 HAZY (A)  CLEAR Final   Specific Gravity, Urine 12/27/2023 1.018  1.005 - 1.030 Final   pH 12/27/2023 5.0  5.0 - 8.0 Final   Glucose, UA 12/27/2023 NEGATIVE  NEGATIVE mg/dL Final   Hgb urine dipstick 12/27/2023 MODERATE (A)  NEGATIVE Final   Bilirubin Urine 12/27/2023 NEGATIVE  NEGATIVE Final   Ketones, ur  12/27/2023 20 (A)  NEGATIVE mg/dL Final   Protein, ur 92/88/7974 30 (A)  NEGATIVE mg/dL Final   Nitrite  12/27/2023 NEGATIVE  NEGATIVE Final   Leukocytes,Ua 12/27/2023 NEGATIVE  NEGATIVE Final   RBC / HPF 12/27/2023 6-10  0 - 5 RBC/hpf Final   WBC, UA 12/27/2023 0-5  0 - 5 WBC/hpf Final   Bacteria, UA 12/27/2023 NONE SEEN  NONE SEEN Final   Squamous Epithelial / HPF 12/27/2023 0-5  0 - 5 /HPF Final   Mucus 12/27/2023 PRESENT   Final   Performed at [facility], [address], [place], [place] [address]   Sodium 12/27/2023 128 (L)  135 - 145 mmol/L Final   Potassium 12/27/2023 4.4  3.5 - 5.1 mmol/L Final   Chloride 12/27/2023 91 (L)  98 - 111 mmol/L Final   CO2 12/27/2023 28  22 - 32 mmol/L Final   Glucose, Bld 12/27/2023 174 (H)  70 - 99 mg/dL Final   Glucose reference range applies only to samples taken after fasting for at least 8 hours.   BUN 12/27/2023 9  8 - 23 mg/dL Final   Creatinine, Ser 12/27/2023 0.57  0.44 - 1.00 mg/dL Final   Calcium  12/27/2023 8.7 (L)  8.9 - 10.3 mg/dL Final   GFR, Estimated 12/27/2023 >60  >60 mL/min Final   Comment: (NOTE) Calculated using the CKD-EPI Creatinine Equation (2021)    Anion gap 12/27/2023 9  5 - 15 Final   Performed at [facility], [address] [place], [place] [address]   WBC 12/28/2023 5.7  4.0 - 10.5 K/uL Final   RBC 12/28/2023 3.39 (L)  3.87 - 5.11 MIL/uL Final   Hemoglobin 12/28/2023 10.4 (L)  12.0 - 15.0 g/dL Final   HCT 92/87/7974 31.6 (L)  36.0 - 46.0 % Final   MCV 12/28/2023 93.2  80.0 - 100.0 fL Final   MCH 12/28/2023 30.7  26.0 - 34.0 pg Final   MCHC 12/28/2023 32.9  30.0 - 36.0 g/dL Final   RDW 92/87/7974 13.2  11.5 - 15.5 % Final   Platelets 12/28/2023 234  150 - 400 K/uL Final   nRBC 12/28/2023 0.0  0.0 - 0.2 % Final   Performed at [facility], [address], [place], [place] [address]   Sodium 12/28/2023 138  135 - 145 mmol/L Final   DELTA CHECK NOTED    Potassium 12/28/2023 3.6  3.5 - 5.1 mmol/L Final   Chloride 12/28/2023 98  98 - 111 mmol/L Final   CO2 12/28/2023 33 (H)  22 - 32 mmol/L Final   Glucose, Bld 12/28/2023 128 (H)  70 - 99 mg/dL Final   Glucose reference range applies only to samples taken after fasting for at least 8 hours.   BUN 12/28/2023 11  8 - 23 mg/dL Final   Creatinine, Ser 12/28/2023 0.57  0.44 - 1.00 mg/dL Final   Calcium  12/28/2023 8.4 (L)  8.9 - 10.3 mg/dL Final   Total Protein 92/87/7974 6.0 (L)  6.5 - 8.1 g/dL Final   Albumin 92/87/7974 3.1 (L)  3.5 - 5.0 g/dL Final   AST 92/87/7974 12 (L)  15 - 41 U/L Final   ALT 12/28/2023 10  0 - 44 U/L Final   Alkaline Phosphatase 12/28/2023 43  38 - 126 U/L Final   Total Bilirubin 12/28/2023 0.5  0.0 - 1.2 mg/dL Final   GFR, Estimated 12/28/2023 >60  >60 mL/min Final   Comment: (NOTE) Calculated using the CKD-EPI Creatinine Equation (2021)    Anion gap 12/28/2023 7  5 - 15 Final   Performed at [facility], [address] [place], [place] [address]   Hgb A1c MFr Bld 12/28/2023 4.9  4.8 - 5.6 % Final   Comment: (NOTE)         Prediabetes: 5.7 - 6.4         Diabetes: >6.4         Glycemic control for adults with diabetes: <7.0    Mean Plasma Glucose 12/28/2023 94  mg/dL Final   Comment: (NOTE) Performed At: [facility] [address] [place], [place] [id number] Jennette Shorter MD Ey:[id number]   Hospital Outpatient Visit on 12/18/2023  Component Date Value Ref Range Status   MRSA, PCR 12/18/2023 NEGATIVE  NEGATIVE Final   Staphylococcus aureus 12/18/2023 NEGATIVE  NEGATIVE Final   Comment: (NOTE) The Xpert SA Assay (FDA approved for NASAL specimens in patients 38 years of age and older), is one component of a comprehensive surveillance program. It is not intended to diagnose infection nor to guide or monitor treatment. Performed at [facility], [address], [place], [place] [address]    Sodium 12/18/2023 142   135 - 145 mmol/L Final   Potassium 12/18/2023 3.9  3.5 - 5.1 mmol/L Final   Chloride 12/18/2023 108  98 - 111 mmol/L Final   CO2 12/18/2023 24  22 - 32 mmol/L Final   Glucose, Bld 12/18/2023 96  70 - 99 mg/dL Final   Glucose reference range applies only to samples taken after fasting for at least 8 hours.   BUN 12/18/2023 12  8 - 23 mg/dL Final   Creatinine, Ser 12/18/2023 0.74  0.44 - 1.00 mg/dL Final   Calcium  12/18/2023 9.4  8.9 - 10.3 mg/dL Final   GFR, Estimated 12/18/2023 >60  >60 mL/min Final   Comment: (NOTE) Calculated using the CKD-EPI Creatinine Equation (2021)    Anion gap 12/18/2023 10  5 - 15 Final   Performed at [facility], [address], [place], [place] [address]   WBC 12/18/2023 4.8  4.0 - 10.5 K/uL Final   RBC 12/18/2023 5.04  3.87 - 5.11 MIL/uL Final   Hemoglobin 12/18/2023 15.4 (H)  12.0 - 15.0 g/dL Final   HCT 92/97/7974 47.2 (H)  36.0 - 46.0 % Final   MCV 12/18/2023 93.7  80.0 - 100.0 fL Final   MCH 12/18/2023 30.6  26.0 - 34.0 pg Final   MCHC 12/18/2023 32.6  30.0 - 36.0 g/dL Final   RDW 92/97/7974 13.2  11.5 - 15.5 % Final   Platelets 12/18/2023 289  150 - 400 K/uL Final   nRBC 12/18/2023 0.0  0.0 - 0.2 % Final   Performed at [facility], [address], [place], [place] [address]     X-Rays:DG Knee 1-2 Views Right Result Date: 12/25/2023 CLINICAL DATA:  Status post right total knee replacement. EXAM: RIGHT KNEE - 1-2 VIEW COMPARISON:  September 21, 2004. FINDINGS: Right femoral and tibial components are well situated. Expected postoperative changes are noted in the soft tissues anteriorly. IMPRESSION: Status post right total knee arthroplasty. Electronically Signed   By: Lynwood Landy Raddle M.D.   On: 12/25/2023 11:56   DG Bone Density Result Date: 12/24/2023 EXAM: DUAL X-RAY ABSORPTIOMETRY (DXA) FOR BONE MINERAL DENSITY 12/24/2023 10:07 am CLINICAL DATA:  69 year old Female Postmenopausal. Screening for osteoporosis TECHNIQUE: An  axial (e.g., hips, spine) and/or appendicular (e.g., radius) exam was performed, as appropriate, using [employer] Secretary/administrator at [employer]. Images are obtained for bone  mineral density measurement and are not obtained for diagnostic purposes. [id number] Exclusions: L1 due to degenerative changes COMPARISON:  None. New baseline. FINDINGS: Scan quality: Good. LUMBAR SPINE (L2-L4): BMD (in g/cm2): 0.954 T-score: -2.1 Z-score: -1.2 LEFT FEMORAL NECK: BMD (in g/cm2): 0.750 T-score: -2.1 Z-score: -0.9 LEFT TOTAL HIP: BMD (in g/cm2): 0.876 T-score: -1.0 Z-score: -0.2 RIGHT FEMORAL NECK: BMD (in g/cm2): 0.758 T-score: -2.0 Z-score: -0.9 RIGHT TOTAL HIP: BMD (in g/cm2): 0.817 T-score: -1.5 Z-score: -0.7 FRAX 10-YEAR PROBABILITY OF FRACTURE: 10-year fracture risk is performed using the [employer] FRAX calculator based on patient-reported risk factors. Major osteoporotic fracture: 17.5% Hip fracture: 3.0% Other situations known to alter the reliability of the FRAX score should be considered when making treatment decisions, including chronic glucocorticoid use and past treatments. Further guidance on treatment can be found at the [facility] Osteoporosis Foundation's website https://www.patton.com/. IMPRESSION: Osteopenia based on BMD. Fracture risk is increased. Increased risk is based on low BMD, FRAX calculation. RECOMMENDATIONS: 1. All patients should optimize calcium  and vitamin D  intake. 2. Consider FDA-approved medical therapies in postmenopausal women and men aged 6 years and older, based on the following: - A hip or vertebral (clinical or morphometric) fracture - T-score less than or equal to -2.5 and secondary causes have been excluded. - Low bone mass (T-score between -1.0 and -2.5) and a 10-year probability of a hip fracture greater than or equal to 3% or a 10-year probability of a major osteoporosis-related fracture greater than or equal to 20% based on the [place] -adapted WHO algorithm.  - Clinician judgment and/or patient preferences may indicate treatment for people with 10-year fracture probabilities above or below these levels 3. Patients with diagnosis of osteoporosis or at high risk for fracture should have regular bone mineral density tests. For patients eligible for Medicare, routine testing is allowed once every 2 years. The testing frequency can be increased to one year for patients who have rapidly progressing disease, those who are receiving or discontinuing medical therapy to restore bone mass, or have additional risk factors. Electronically Signed   By: Reyes Phi M.D.   On: 12/24/2023 12:13    EKG: Orders placed or performed during the hospital encounter of 12/18/23   EKG 12 lead per protocol   EKG 12 lead per protocol     Hospital Course: Brittany [name] is a 68 y.o. who was admitted to [facility]. They were brought to the operating room on 12/25/2023 and underwent Procedure(s): ARTHROPLASTY, KNEE, TOTAL.  Patient tolerated the procedure well and was later transferred to the recovery room and then to the orthopaedic floor for postoperative care.  They were given PO and IV analgesics for pain control following their surgery.  They were given 24 hours of postoperative antibiotics of  Anti-infectives (From admission, onward)    Start     Dose/Rate Route Frequency Ordered Stop   12/25/23 1400  ceFAZolin  (ANCEF ) IVPB 2g/100 mL premix        2 g 200 mL/hr over 30 Minutes Intravenous Every 6 hours 12/25/23 1225 12/25/23 2131   12/25/23 0630  ceFAZolin  (ANCEF ) IVPB 2g/100 mL premix        2 g 200 mL/hr over 30 Minutes Intravenous On call to O.R. 12/25/23 0630 12/25/23 0851      and started on DVT prophylaxis in the form of Aspirin  and SCDs and TEDs.   PT and OT were ordered for total joint protocol.  Discharge planning consulted to help with postop disposition and equipment  needs.  Patient had a fair night on the evening of surgery.  They started to get up OOB  with therapy on day one. Continued to work with therapy into day two.   By day three, the patient had progressed with therapy and meeting their goals.  Incision was healing well.  Patient was seen in rounds and was ready to go home.   Diet: Regular diet Activity:WBAT Follow-up:in 10-14 days Disposition - Home Discharged Condition: good    Allergies as of 12/28/2023       Reactions   Aspirin  Other (See Comments)   discomfort   Atorvastatin  Other (See Comments)   Patient states she felt suicidal   Codeine Itching   Crestor  [rosuvastatin ] Other (See Comments)   Dizzy, leg pain and states she felt suicidal   Gabapentin  Other (See Comments)   Niacin And Related Itching   Sulfa Antibiotics Other (See Comments)   Other reaction(s): Unknown   Sulfamethoxazole-trimethoprim Other (See Comments)   Unknown reaction per pt   Topiramate  Other (See Comments)   Per patient, it causes stomach burning   Trileptal  [oxcarbazepine ] Itching   itching   Trimethoprim Other (See Comments)   Unknown, per pt   Ultram [tramadol] Itching   Vioxx [rofecoxib] Other (See Comments)   Unknown per pt        Medication List     TAKE these medications    acetaminophen  500 MG tablet Commonly known as: TYLENOL  Take 500-1,000 mg by mouth every 6 (six) hours as needed (pain.).   albuterol  (2.5 MG/3ML) 0.083% nebulizer solution Commonly known as: PROVENTIL  Take 3 mLs (2.5 mg total) by nebulization every 6 (six) hours as needed for wheezing or shortness of breath.   ProAir  RespiClick 108 (90 Base) MCG/ACT Aepb Generic drug: Albuterol  Sulfate Inhale 2 puffs into the lungs every 6 (six) hours as needed.   aspirin  EC 81 MG tablet Take 1 tablet (81 mg total) by mouth in the morning and at bedtime. Swallow whole.   Azelastine  HCl 137 MCG/SPRAY Soln SPRAY 1 SPRAY IN EACH NOSTRIL TWICE DAILY   busPIRone  15 MG tablet Commonly known as: BUSPAR  Take 1 tablet (15 mg total) by mouth 3 (three) times daily.    cetirizine  10 MG tablet Commonly known as: ZYRTEC  Take 1 tablet (10 mg total) by mouth daily.   clonazePAM  0.5 MG tablet Commonly known as: KLONOPIN  TAKE 1 TABLET BY MOUTH 2 TIMES A DAY AND TAKE TWO TABLETS BY MOUTH AT BEDTIME What changed: additional instructions   D3 + K2 PO Take 1 tablet by mouth daily.   dicyclomine  10 MG capsule Commonly known as: BENTYL  Take 1 capsule (10 mg total) by mouth as needed for spasms.   docusate sodium  100 MG capsule Commonly known as: Colace Take 1 capsule (100 mg total) by mouth 2 (two) times daily.   doxazosin  4 MG tablet Commonly known as: CARDURA  Take 2 tablets (8 mg total) by mouth at bedtime.   ezetimibe  10 MG tablet Commonly known as: ZETIA  TAKE 1 TABLET BY MOUTH DAILY   famotidine  20 MG tablet Commonly known as: PEPCID  Take 1 tablet (20 mg total) by mouth 2 (two) times daily. What changed: when to take this   fluticasone  50 MCG/ACT nasal spray Commonly known as: FLONASE  USE 2 SPRAYS INTO BOTH NOSTRILS TWICE DAILY What changed: See the new instructions.   fluticasone -salmeterol 250-50 MCG/ACT Aepb Commonly known as: ADVAIR Inhale 1 puff into the lungs every 12 (twelve) hours.   Gaviscon  95-358  MG/15ML Susp Generic drug: aluminum hydroxide-magnesium  carbonate Take 15-30 mLs by mouth as needed.   HYDROmorphone  2 MG tablet Commonly known as: Dilaudid  Take 1-2 tablets (2-4 mg total) by mouth every 4 (four) hours as needed for severe pain (pain score 7-10).   ibuprofen 200 MG tablet Commonly known as: ADVIL Take 200-400 mg by mouth every 8 (eight) hours as needed (pain.).   IRON PO Take 1 tablet by mouth daily.   MAGNESIUM  PO Take 1 tablet by mouth daily.   methocarbamol  500 MG tablet Commonly known as: ROBAXIN  Take 1 tablet (500 mg total) by mouth every 8 (eight) hours as needed for muscle spasms.   mirtazapine  45 MG tablet Commonly known as: REMERON  Take 1 tablet (45 mg total) by mouth at bedtime.    montelukast  10 MG tablet Commonly known as: SINGULAIR  TAKE 1 TABLET BY MOUTH AT BEDTIME   multivitamin with minerals Tabs tablet Take 1 tablet by mouth daily with breakfast.   OMEGA 3 PO Take 1 capsule by mouth daily.   pantoprazole  40 MG tablet Commonly known as: PROTONIX  Take 1 tablet (40 mg total) by mouth 2 (two) times daily. Take 30 min to 1 hr before morning coffee and dinner.   polyethylene glycol 17 g packet Commonly known as: MIRALAX  / GLYCOLAX  Take 17 g by mouth daily.   Repatha  SureClick 140 MG/ML Soaj Generic drug: Evolocumab  Inject 140 mg into the skin every 14 (fourteen) days.   SCAR EX Apply 1 Application topically as needed (bruises).   sodium chloride  0.65 % Soln nasal spray Commonly known as: OCEAN Place 1 spray into both nostrils in the morning and at bedtime.   VITA-C PO Take 1 tablet by mouth daily.   VITAMIN D -3 PO Take 5,000 Units by mouth daily.        Follow-up Information     Katerin Negrete M, PA-C. Go on 01/09/2024.   Specialty: Orthopedic Surgery Why: You are scheduled for a post op appointment on Thursday 01/09/24 at 1:30pm Contact information: [address] [place] [address] [place] [address] [phone]                 Signed: Ara Mano, PA-C Orthopaedic Surgery 01/14/2024, 11:20 AM

## 2024-01-17 ENCOUNTER — Ambulatory Visit: Admitting: Psychiatry

## 2024-01-17 DIAGNOSIS — F411 Generalized anxiety disorder: Secondary | ICD-10-CM | POA: Diagnosis not present

## 2024-01-17 NOTE — Progress Notes (Signed)
 [facility] Counselor/Therapist Progress Note  Patient ID: Brittany [name], MRN: [id number],    Date: 01/17/2024  Time Spent: 53 minutes   Treatment Type: Individual Therapy  Reported Symptoms:  anxiety, irritability decreased some, recovering from knee replacement surgery, loneliness decreasing, trust issues, sensitivity to perceived rejection and related some to PTSD in the past, overthinking decreased some, relationship concerns    Mental Status Exam:  Appearance:   Casual and Neat     Behavior:  Appropriate, Sharing, and Motivated  Motor:  Just had knee replacement surgery  Speech/Language:   Clear and Coherent  Affect:  Depressed and anxiety  Mood:  anxious and depressed  Thought process:  goal directed  Thought content:    WNL  Sensory/Perceptual disturbances:    WNL  Orientation:  oriented to person, place, time/date, situation, day of week, month of year, year, and stated date of Aug. 1, 2025  Attention:  Good  Concentration:  Good and Fair  Memory:  WNL  Fund of knowledge:   Good  Insight:    Good and Fair  Judgment:   Good  Impulse Control:  Good   Risk Assessment: Danger to Self:  No Self-injurious Behavior: No Danger to Others: No Duty to Warn:no Physical Aggression / Violence:No  Access to Firearms a concern: No  Gang Involvement:No   Subjective:   Patient today in session after having had recent knee replacement surgery, and is involved in PT which is helping. Rejection issues and after surgery experiences made it worse. Issues in relationships with friends and neighbors, not so much within family. Talked through multiple situations sharing hurt and anger, lack of understanding, and some projection. Frustrations expressed openly which seemed to help patient. Feels good about her surgery and the progress she has made and is making. Continues to work on some self-esteem issues, her worrying, doubting others, letting go of negative assumptions, and trying  to focus more on what goes well or right versus the negatives.   Trying not to overstress as much. Feeling encouraged with her progress as noted by patient.   Interventions: Cognitive Behavioral Therapy, Solution-Oriented/Positive Psychology, and Ego-Supportive 1) Long Term Goal: Develop and follow through on strategies to reduce symptoms.     Short Term Goal: Reduce anxiety and improve coping skills.     Objective: Learn two new ways of coping with daily stressors.     Objective: Develop strategies for thought distraction when fixating on the future.     Objective: Manage panic episodes. 2) Long Term Goal: Maintain stability of mood     Short Term Goal: Increase ability to manage moods.     Objective: Discuss and resolve troubling personal and interpersonal issues.     Diagnosis:   ICD-10-CM   1. Anxiety state  F41.1      Plan: Patient today working further while she is recovering from knee replacement surgery.  Not quite as stressed today and reports that she is doing fairly well in her recovery and certainly seems to be mobilizing better with the help of a rollator.  Attitude seems more positive today.  Continued work on her self-esteem, bipolar, healthy boundaries, trying to worry less, trying to doubt others less, letting go of negative assumptions, and being able to focus more on what goes well or in a positive direction versus what does not.  She is feeling positive about her recovery thus far from surgery which currently is helping boost her mood some as well.  These past 2-3 weeks have continued to be a little more challenging after surgery but due to positive responses from surgery, this has helped patient to be more positive about her health and wellbeing continuing to move in a better direction going forward.  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  Good involvement in session today. Reminded and encouraged patient to be continuing her goal-directed behaviors relating to being less negative in her thought patterns, particularly for now being more self understanding as she just recently had knee replacement surgery and that presents some extra challenges for her, practice better limit setting with other people who are not healthy for her, recognize her overthinking more quickly and work to interrupt it, try reducing frequent thoughts of how things have to be in order to be okay, more regularly practice the pause, working on healthier interaction skills including boundaries, let her faith be a resource of emotional support and stability as well as spiritual, recognize her strengths and not just her challenges, understand that her anxious thoughts are just thoughts and are not necessarily going to play out in reality the way she might assume or fear, patient continues today with being more open and having the ability to see some thoughts and behaviors that she is needing to work on without taking it so personally or critically.  Brittany [name] has made some progress and needs to continue her work with goal-directed behaviors to help her move in a more hopeful and healthier direction going forward into her future.  Goal review and progress/challenges noted with patient.  Next appointment within 2 weeks.   Barnie Bunde, LCSW

## 2024-01-29 DIAGNOSIS — M25561 Pain in right knee: Secondary | ICD-10-CM | POA: Diagnosis not present

## 2024-01-31 ENCOUNTER — Ambulatory Visit: Admitting: Psychiatry

## 2024-01-31 DIAGNOSIS — F3162 Bipolar disorder, current episode mixed, moderate: Secondary | ICD-10-CM | POA: Diagnosis not present

## 2024-01-31 NOTE — Progress Notes (Signed)
 [facility] Counselor/Therapist Progress Note  Patient ID: KEIR FOLAND, MRN: [id number],    Date: 01/31/2024  Time Spent: 55 minutes   Treatment Type: Individual Therapy  Reported Symptoms: anxiety, irritability increased some, recovering  from knee replacement surgery, trust issues, loneliness decreasing, sensitivity to perceived rejection, overthinking, relationship concerns   Mental Status Exam:  Appearance:   Casual and Neat     Behavior:  Appropriate, Sharing, and Motivated  Motor:  Impacted by her knee surgery  Speech/Language:   Clear and Coherent  Affect:  Depressed and anxious  Mood:  anxious and depressed  Thought process:  goal directed  Thought content:    Rumination  Sensory/Perceptual disturbances:    WNL  Orientation:  oriented to person, place, time/date, situation, day of week, month of year, year, and stated date of Aug. 15, 2025  Attention:  Good  Concentration:  Good and Fair  Memory:  WNL  Fund of knowledge:   Good  Insight:    Good and Fair  Judgment:   Fair  Impulse Control:  Good and Fair   Risk Assessment: Danger to Self:  No Self-injurious Behavior: No Danger to Others: No Duty to Warn:no Physical Aggression / Violence:No  Access to Firearms a concern: No  Gang Involvement:No   Subjective:  Patient in for session today motivated and working further on her relationship concerns and frustrations. Shared recent situations with a neighbor that she already struggles with interpersonally. Patient shared openly frustrations in relationships with several people including a couple neighbors. Got involved in a confrontation with a neighbor exchanging insults and still holding onto some resentment today. Discussed this further with patient, acknowledging her frustration and anger, but also encouraging her to not get stuck in the strong negative feelings she is holding onto after confrontation with neighbor. Processed some other ways of managing her  anger without emphasizing the need for revenge, and the end result of holding onto anger/resentment for long periods of time. Doing better with the post-surgery PT. Needing to continue working on relationship issues, projection issues, communication, letting go of the negatives, and managing stressful situations with better coping skills.   Interventions: Cognitive Behavioral Therapy, Solution-Oriented/Positive Psychology, and Ego-Supportive 1) Long Term Goal: Develop and follow through on strategies to reduce symptoms.     Short Term Goal: Reduce anxiety and improve coping skills.     Objective: Learn two new ways of coping with daily stressors.     Objective: Develop strategies for thought distraction when fixating on the future.     Objective: Manage panic episodes. 2) Long Term Goal: Maintain stability of mood     Short Term Goal: Increase ability to manage moods.     Objective: Discuss and resolve troubling personal and interpersonal issues.   Diagnosis:   ICD-10-CM   1. Bipolar 1 disorder, mixed, moderate (HCC)  F31.62      Plan: Patient today working further on her treatment goals, recovering from knee replacement surgery, and today especially working through some anger and resentment regarding herself and to other people that live in her apartment complex.  Was quite angry initially but was able to talk through the situation in session today and finally able to see that she really does need to work on some letting go so that the situations do not carry so much power in her life, and also there is room for some possible misunderstanding as some of her sharing of information involved what she knew the  other person really meant but did not necessarily say.  It was an example again of how certain situations can happen and patient become angry quite quick and shut down totally from another person versus seeing herself being able to work through certain situations.  Also was difficult for her to  to understand limits and boundaries and how she can learn to set healthy boundaries without being so negatively impacted by certain people or situations, as it is hard for her to work on letting go of anger and really letting it go without referring back to it occasionally.  Good work in session today and therapist pointed out to patient some of her strong points in addition to the need areas.  Patient is continuing to heal from her recent knee surgery and feeling encouraged about it.  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  Encouraged patient to continue working with goal-directed behaviors related to being less negative in her thought patterns, particularly for now being more self understanding as she just recently had knee replacement surgery and that presents some extra challenges for patient, practice better limit setting with other people who are not healthy for her, recognize her overthinking more quickly and work to interrupt it, try reducing frequent thoughts of how things have to be in order to be okay, more regularly practice the Pauls, work on healthier interaction skills including boundaries, let her faith be a resource of emotional support and stability as well as spiritual, recognize her strengths and not just her challenges, understand that her anxious thoughts are just thoughts and are not necessarily going to play out in reality the way she might assume or fear, patient continues today with being more open and having the ability to see some thoughts and behaviors that she is needing to work on without taking medicine personally or critically.  Neena Beecham has made progress and that she needs to continue her  work with goal-directed behaviors to help her going forward in a more hopeful and healthier direction.  Goal review and progress/challenges noted with patient.  Next appt within 2 weeks.   Barnie Bunde, LCSW

## 2024-02-14 ENCOUNTER — Ambulatory Visit: Admitting: Psychiatry

## 2024-02-14 DIAGNOSIS — F411 Generalized anxiety disorder: Secondary | ICD-10-CM

## 2024-02-14 DIAGNOSIS — M25561 Pain in right knee: Secondary | ICD-10-CM | POA: Diagnosis not present

## 2024-02-14 NOTE — Progress Notes (Signed)
 [facility] Counselor/Therapist Progress Note  Patient ID: Brittany [name], MRN: [id number],    Date: 02/14/2024  Time Spent: 53 minutes   Treatment Type: Individual Therapy  Reported Symptoms:  anxiety, irritability, recovering from knee replacement surgery, trust issues, sensitivity to perceived rejection, overthinking, relationship/friendship concerns   Mental Status Exam:  Appearance:   Casual and Neat     Behavior:  Appropriate, Sharing, and Motivated  Motor:  Affected by knee surgery  Speech/Language:   Clear and Coherent  Affect:  Anxious, some irritability  Mood:  anxious  Thought process:  goal directed  Thought content:    Rumination  Sensory/Perceptual disturbances:    WNL  Orientation:  oriented to person, place, time/date, situation, day of week, month of year, year, and stated date of Aug. 29, 2025  Attention:  Fair  Concentration:  Fair  Memory:  WNL  Fund of knowledge:   Good  Insight:    Good and Fair  Judgment:   Fair  Impulse Control:  Fair   Risk Assessment: Danger to Self:  No Self-injurious Behavior: No Danger to Others: No Duty to Warn:no Physical Aggression / Violence:No  Access to Firearms a concern: No  Gang Involvement:No   Subjective:  Patient today working in session on her anxiety, relationship concerns, frustrations, irritability, overthinking, making frequent assumptions that may not be accurate, and sensitive to perceived rejection.  Has had some issues recently, in conflict with another relative at her apartments which has been ongoing for a while, and patient setting boundaries with a neighbor with which she has had some conflict more recently. Patient making broad assumptions which may not all be accurate. Hard not to be in control and processed some of her control issues in session today. Patient struggling some with interpersonal issues and working in therapy today on setting better boundaries, communication of those boundaries  in healthy and clear ways but without being hurtful. Looking at some more positive interaction skills. Also working towards letting go of negativity. Continues to make progress in her PT following knee surgery. Review with patient here in session encouraging her to follow through working more often on her goals between sessions.  Continue to encourage her to resist getting stuck and strong negative feelings that she gets sometimes when things do not go well with another person or things do not go as planned nor hoped for.  Also specifically pointing out some of her progress and positive behaviors/thoughts.  Patient is working on her goals and has made some progress, and needs to continue her work on relationship issues, projection issues, communication, letting go of the negatives, and managing stressful situations more effectively with healthy coping skills.  Interventions: Cognitive Behavioral Therapy, Solution-Oriented/Positive Psychology, and Ego-Supportive 1) Long Term Goal: Develop and follow through on strategies to reduce symptoms.     Short Term Goal: Reduce anxiety and improve coping skills.     Objective: Learn two new ways of coping with daily stressors.     Objective: Develop strategies for thought distraction when fixating on the future.     Objective: Manage panic episodes. 2) Long Term Goal: Maintain stability of mood     Short Term Goal: Increase ability to manage moods.     Objective: Discuss and resolve troubling personal and interpersonal issues.    Diagnosis:   ICD-10-CM   1. Anxiety state  F41.1      Plan:  Patient today showing good effort and motivation (which she has worked  on some in sessions) and seemed to be feeling better about herself today.  She does continue to recover from knee replacement surgery and actually making some good gains.  Did not seem to have as many issues of anger since last appointment, and we spoke about a couple of situations with a neighbor where  the relationship is still volatile at times and patient continues to work on better management when she needs or wants to set healthy boundaries without going overboard, and leaving doors open for healthier friendships.  Trying to see the positives and others more at times.  Continues her work on trying to not jump to conclusions that are negative, her own self-esteem, interpersonal skills where she lives and beyond, trying not to hold onto anger, and being able to manage resentment/frustrations/aggravations without hanging onto them for long periods of time.  Reminded and encouraged patient and her work with goal-directed behaviors related to being less negative in her thought patterns, being more self understanding as she recently had knee replacement surgery and that presents some extra challenges for patient, practice better limit setting with other people who are not healthy for her, refrain from jumping to quick conclusions about situations understanding that she may not have all the facts involved, recognize her overthinking more quickly and work to interrupt it, try reducing frequent thoughts of how things have to be in order to be okay, more regularly practice the Pause, work on healthier interaction skills including boundaries, let her faith be a resource of emotional support and stability as well as spiritual, recognize her strengths in addition to her challenges, understand that her anxious thoughts are just thoughts and are not necessarily going to play out in reality the way she might assume or fear.  Patient continues to work with more openness and trying to see her abilities versus her limitations, recognizing thoughts that are not helpful to her and trying to move forward in trying to replace those thoughts with ones that are more positive and hopeful.  Brittany [name] is making progress and needs to continue her work with goal-directed behaviors to support her and going forward in a more hopeful  and healthier direction into the future.  Goal review and progress/challenges noted with patient.  Next appointment within 2 weeks.   Barnie Bunde, LCSW

## 2024-02-18 NOTE — Progress Notes (Signed)
 CARDIOLOGY CONSULT NOTE       Patient ID: Brittany [name] MRN: [id number] DOB/AGE: Sep 27, 1955 68 y.o.  Primary Physician: Brittany Brittany [name] [name], MD Primary Cardiologist: Brittany [name]     HPI:  68 y.o. previously seen for dyspnea. History of COPD, abnormal ECG RBBB/LAFB, PTSD, anxiety agoraphobia sees psych, chronic dyspnea and asthma. Normal myovue in 2016 with EF 74% Calcium  score done 07/21/21 was 215 which is 89 th percentile for age/sex She is on lipitor for HLD. She had normal EF on TTE August 2021 Sees Dr Brittany [name] pulmonary for her asthma Former smoker and had COVID in 2020 Uses Symbicort  and albuterol  as needed  Some fibrotic changes on CT 03/2020 but PFTls without restrictive/diffusion deficits 2022  LDL high and has not tolerated crestor  /lipitor muscle pains suicidal ideaation. Red yeast rice not adequate LDL 187 10/23/22 Discussed need for Rx with family history and high calcium  score for age. Started on Repatha  09/11/23 LDL markedly improved 170-> 56 09/03/23   Had right TKR 12/25/23 with Dr Brittany [name].    ROS All other systems reviewed and negative except as noted above  Past Medical History:  Diagnosis Date   Abnormal EKG    Allergy    Anxiety    Arthritis    Asthma    Bipolar depression (HCC), Anxiety, PTSD, Panic disorder    -managed by [facility]   CAD (coronary artery disease)    COPD (chronic obstructive pulmonary disease) (HCC)    Depression    Foot pain    Gallstones    GERD (gastroesophageal reflux disease)    Heart murmur    as a child   HLD (hyperlipidemia)    Insomnia    Leg swelling    Obesity    Osteoporosis    osteopenia   Pneumonia due to COVID-19 virus 04/22/2019   Tachycardia     Family History  Problem Relation Age of Onset   Hypertension Mother    Hyperlipidemia Mother    Congestive Heart Failure Mother    Other Father        killed   COPD Brother    Heart disease Brother    Pulmonary embolism Brother    Colon polyps Maternal Aunt     COPD Paternal Aunt        a lot of aunts and uncle COPD or Emphysema   Emphysema Paternal Uncle    Breast cancer Daughter    Colon cancer Neg Hx    Esophageal cancer Neg Hx    Pancreatic cancer Neg Hx    Stomach cancer Neg Hx    Liver disease Neg Hx    Rectal cancer Neg Hx     Social History   Socioeconomic History   Marital status: Single    Spouse name: Not on file   Number of children: 3   Years of education: Not on file   Highest education level: Associate degree: occupational, Scientist, product/process development, or vocational program  Occupational History   Occupation: disabled  Tobacco Use   Smoking status: Former    Current packs/day: 0.00    Average packs/day: 20.0 packs/day for 1 year (20.0 ttl pk-yrs)    Types: Cigarettes    Start date: 06/18/1998    Quit date: 06/19/1999    Years since quitting: 24.7   Smokeless tobacco: Never   Tobacco comments:    20 year estimate but probably less   Vaping Use   Vaping status: Never Used  Substance and Sexual Activity   Alcohol  use: Yes    Comment: Rare   Drug use: No   Sexual activity: Never  Other Topics Concern   Not on file  Social History Narrative   Work or School: Disabled seconndary to psychiatric conditions      Home Situation: lives alone with 2 cats and one dog      Spiritual Beliefs: Christian      Lifestyle: no regular exercise, diet  Not great - wants to embark on healthier lifestyle   Social Drivers of Health   Financial Resource Strain: Medium Risk (12/01/2023)   Overall Financial Resource Strain (CARDIA)    Difficulty of Paying Living Expenses: Somewhat hard  Food Insecurity: Patient Declined (12/25/2023)   Hunger Vital Sign    Worried About Running Out of Food in the Last Year: Patient declined    Ran Out of Food in the Last Year: Patient declined  Recent Concern: Food Insecurity - Food Insecurity Present (12/01/2023)   Hunger Vital Sign    Worried About Running Out of Food in the Last Year: Sometimes true    Ran Out of  Food in the Last Year: Sometimes true  Transportation Needs: No Transportation Needs (12/25/2023)   PRAPARE - Administrator, Civil Service (Medical): No    Lack of Transportation (Non-Medical): No  Physical Activity: Insufficiently Active (12/01/2023)   Exercise Vital Sign    Days of Exercise per Week: 2 days    Minutes of Exercise per Session: 30 min  Stress: Stress Concern Present (12/01/2023)   [employer] of Occupational Health - Occupational Stress Questionnaire    Feeling of Stress: To some extent  Social Connections: Moderately Integrated (12/25/2023)   Social Connection and Isolation Panel    Frequency of Communication with Friends and Family: Three times a week    Frequency of Social Gatherings with Friends and Family: Once a week    Attends Religious Services: More than 4 times per year    Active Member of [employer] or Organizations: No    Attends Banker Meetings: 1 to 4 times per year    Marital Status: Divorced  Recent Concern: Social Connections - Moderately Isolated (12/01/2023)   Social Connection and Isolation Panel    Frequency of Communication with Friends and Family: Three times a week    Frequency of Social Gatherings with Friends and Family: Once a week    Attends Religious Services: More than 4 times per year    Active Member of [employer] or Organizations: No    Attends Engineer, structural: Not on file    Marital Status: Divorced  Intimate Partner Violence: Not At Risk (12/25/2023)   Humiliation, Afraid, Rape, and Kick questionnaire    Fear of Current or Ex-Partner: No    Emotionally Abused: No    Physically Abused: No    Sexually Abused: No    Past Surgical History:  Procedure Laterality Date   BREAST BIOPSY Right    biopsy of nipple   CARPAL TUNNEL RELEASE Left    CESAREAN SECTION     x 2   CHOLECYSTECTOMY     COLONOSCOPY     ENDOMETRIAL ABLATION     EXCISION MORTON'S NEUROMA Left 01/11/2022   Procedure: SECOND WEBSPACE  MORTON'S NEUROMA EXCISION;  Surgeon: Kit Rush, MD;  Location: [facility];  Service: Orthopedics;  Laterality: Left;   FOOT SURGERY Left    HAMMER TOE SURGERY Left 01/11/2022   Procedure: COLLATERAL LIGAMENT REPAIRS WITH SECOND AND  THIRD HAMMER TOE CORRECTION;  Surgeon: Kit Rush, MD;  Location: [facility];  Service: Orthopedics;  Laterality: Left;   KNEE SURGERY Right    reconstruction for patellar dislocation   LAPAROSCOPY     x 2   TOENAIL EXCISION Left 01/11/2022   Procedure: HALLUX TOENAIL PERMANENT EXCISION;  Surgeon: Kit Rush, MD;  Location: [facility];  Service: Orthopedics;  Laterality: Left;   TOTAL KNEE ARTHROPLASTY Right 12/25/2023   Procedure: ARTHROPLASTY, KNEE, TOTAL;  Surgeon: Brittany [name] Purchase, MD;  Location: [facility];  Service: Orthopedics;  Laterality: Right;   TUBAL LIGATION     ULNAR NERVE REPAIR Left    UPPER GASTROINTESTINAL ENDOSCOPY     WEIL OSTEOTOMY Left 01/11/2022   Procedure: SECOND, THIRD AND FOURTH WEIL OSTEOTOMIES;  Surgeon: Kit Rush, MD;  Location: [facility];  Service: Orthopedics;  Laterality: Left;      Current Outpatient Medications:    acetaminophen  (TYLENOL ) 500 MG tablet, Take 500-1,000 mg by mouth every 6 (six) hours as needed (pain.)., Disp: , Rfl:    albuterol  (PROVENTIL ) (2.5 MG/3ML) 0.083% nebulizer solution, Take 3 mLs (2.5 mg total) by nebulization every 6 (six) hours as needed for wheezing or shortness of breath., Disp: 75 mL, Rfl: 12   Albuterol  Sulfate (PROAIR  RESPICLICK) 108 (90 Base) MCG/ACT AEPB, Inhale 2 puffs into the lungs every 6 (six) hours as needed., Disp: 1 each, Rfl: 5   aluminum hydroxide-magnesium  carbonate (GAVISCON ) 95-358 MG/15ML SUSP, Take 15-30 mLs by mouth as needed., Disp: 355 mL, Rfl: 0   Ascorbic Acid  (VITA-C PO), Take 1 tablet by mouth daily., Disp: , Rfl:    Azelastine  HCl 137 MCG/SPRAY SOLN, SPRAY 1 SPRAY IN EACH NOSTRIL TWICE DAILY, Disp: 30 mL,  Rfl: 5   azithromycin  (ZITHROMAX ) 250 MG tablet, Take as directed, Disp: 6 tablet, Rfl: 0   busPIRone  (BUSPAR ) 15 MG tablet, Take 1 tablet (15 mg total) by mouth 3 (three) times daily., Disp: 270 tablet, Rfl: 1   cetirizine  (ZYRTEC ) 10 MG tablet, Take 1 tablet (10 mg total) by mouth daily., Disp: 90 tablet, Rfl: 1   Cholecalciferol (VITAMIN D -3 PO), Take 5,000 Units by mouth daily., Disp: , Rfl:    clonazePAM  (KLONOPIN ) 0.5 MG tablet, TAKE 1 TABLET BY MOUTH 2 TIMES A DAY AND TAKE TWO TABLETS BY MOUTH AT BEDTIME (Patient taking differently: Taking 1/2 tablet  BID and 2 tablets at night), Disp: 360 tablet, Rfl: 0   dicyclomine  (BENTYL ) 10 MG capsule, Take 1 capsule (10 mg total) by mouth as needed for spasms., Disp: 90 capsule, Rfl: 2   docusate sodium  (COLACE) 100 MG capsule, Take 1 capsule (100 mg total) by mouth 2 (two) times daily., Disp: 60 capsule, Rfl: 2   doxazosin  (CARDURA ) 4 MG tablet, Take 2 tablets (8 mg total) by mouth at bedtime., Disp: 180 tablet, Rfl: 0   Evolocumab  (REPATHA  SURECLICK) 140 MG/ML SOAJ, Inject 140 mg into the skin every 14 (fourteen) days., Disp: 6 mL, Rfl: 3   ezetimibe  (ZETIA ) 10 MG tablet, TAKE 1 TABLET BY MOUTH DAILY, Disp: 90 tablet, Rfl: 1   famotidine  (PEPCID ) 20 MG tablet, Take 1 tablet (20 mg total) by mouth 2 (two) times daily., Disp: 60 tablet, Rfl: 0   Ferrous Sulfate  (IRON PO), Take 1 tablet by mouth daily., Disp: , Rfl:    fluticasone  (FLONASE ) 50 MCG/ACT nasal spray, USE 2 SPRAYS INTO BOTH NOSTRILS TWICE DAILY (Patient taking differently: Place 2 sprays into both nostrils in  the morning and at bedtime.), Disp: 96 mL, Rfl: 2   fluticasone -salmeterol (ADVAIR) 250-50 MCG/ACT AEPB, Inhale 1 puff into the lungs every 12 (twelve) hours., Disp: 180 each, Rfl: 1   HYDROcodone-acetaminophen  (NORCO) 10-325 MG tablet, Take 1 tablet every 4 hours by oral route as needed for 7 days., Disp: , Rfl:    HYDROmorphone  (DILAUDID ) 2 MG tablet, Take 1-2 tablets (2-4 mg total)  by mouth every 4 (four) hours as needed for severe pain (pain score 7-10)., Disp: 40 tablet, Rfl: 0   ibuprofen (ADVIL) 200 MG tablet, Take 200-400 mg by mouth every 8 (eight) hours as needed (pain.)., Disp: , Rfl:    MAGNESIUM  PO, Take 1 tablet by mouth daily., Disp: , Rfl:    methocarbamol  (ROBAXIN ) 500 MG tablet, Take 1 tablet (500 mg total) by mouth every 8 (eight) hours as needed for muscle spasms., Disp: 30 tablet, Rfl: 1   mirtazapine  (REMERON ) 45 MG tablet, Take 1 tablet (45 mg total) by mouth at bedtime., Disp: 90 tablet, Rfl: 1   montelukast  (SINGULAIR ) 10 MG tablet, TAKE 1 TABLET BY MOUTH AT BEDTIME, Disp: 100 tablet, Rfl: 0   Multiple Vitamin (MULTIVITAMIN WITH MINERALS) TABS tablet, Take 1 tablet by mouth daily with breakfast., Disp: , Rfl:    Omega-3 Fatty Acids (OMEGA 3 PO), Take 1 capsule by mouth daily., Disp: , Rfl:    pantoprazole  (PROTONIX ) 40 MG tablet, Take 1 tablet (40 mg total) by mouth 2 (two) times daily. Take 30 min to 1 hr before morning coffee and dinner., Disp: 90 tablet, Rfl: 3   polyethylene glycol (MIRALAX  / GLYCOLAX ) 17 g packet, Take 17 g by mouth daily., Disp: 14 each, Rfl: 0   predniSONE  (DELTASONE ) 10 MG tablet, Take 4 tablets (40 mg total) by mouth daily with breakfast., Disp: 20 tablet, Rfl: 0   Scar Treatment Products (SCAR EX), Apply 1 Application topically as needed (bruises)., Disp: , Rfl:    sodium chloride  (OCEAN) 0.65 % SOLN nasal spray, Place 1 spray into both nostrils in the morning and at bedtime., Disp: , Rfl:    Vitamin D -Vitamin K (D3 + K2 PO), Take 1 tablet by mouth daily., Disp: , Rfl:      Physical Exam: Blood pressure 118/72, pulse 72, height 5' 1 (1.549 m), weight 181 lb 9.6 oz (82.4 kg), SpO2 97%.    Affect appropriate Obese HEENT: normal Neck supple with no adenopathy JVP normal no bruits no thyromegaly Lungs clear with no wheezing and good diaphragmatic motion Heart:  S1/S2 no murmur, no rub, gallop or click PMI  normal Abdomen: benighn, BS positve, no tenderness, no AAA no bruit.  No HSM or HJR Distal pulses intact with no bruits Post right TKR   Labs:   Lab Results  Component Value Date   WBC 5.7 12/28/2023   HGB 10.4 (L) 12/28/2023   HCT 31.6 (L) 12/28/2023   MCV 93.2 12/28/2023   PLT 234 12/28/2023   No results for input(s): NA, K, CL, CO2, BUN, CREATININE, CALCIUM , PROT, BILITOT, ALKPHOS, ALT, AST, GLUCOSE in the last 168 hours.  Invalid input(s): LABALBU No results found for: CKTOTAL, CKMB, CKMBINDEX, TROPONINI  Lab Results  Component Value Date   CHOL 140 09/03/2023   CHOL CANCELED 05/22/2023   CHOL 256 (H) 12/13/2022   Lab Results  Component Value Date   HDL 70 09/03/2023   HDL CANCELED 05/22/2023   HDL 65 12/13/2022   Lab Results  Component Value Date   LDLCALC 56  09/03/2023   LDLCALC CANCELED 05/22/2023   LDLCALC 179 (H) 12/13/2022   Lab Results  Component Value Date   TRIG 67 09/03/2023   TRIG CANCELED 05/22/2023   TRIG 72 12/13/2022   Lab Results  Component Value Date   CHOLHDL 2.0 09/03/2023   CHOLHDL CANCELED 05/22/2023   CHOLHDL 3.9 12/13/2022   No results found for: LDLDIRECT    Radiology: No results found.  EKG: 02/24/2024  SR rate 68  RBBB/LAD   ASSESSMENT AND PLAN:   CAD:  subclinical calcium  noted on CT Elevated score for age continue lifestyle changes and PSK9 No chest pain or indication for stress testing Abnormal ECG:  RBBB/LAD chronic ? Related to lung dx Yearly ECG Dyspnea related to prior smoking, asthma obesity and prior COVID F/U Dr Brittany [name] pulmonary Continue inhalers No active wheezing on exam EF normal on myovue 2016 and TTE August 2021 PTSD: with anxiety/depression f/u psych on Buspar  and valium  HLD:  Now on Repatha  with LDL at goal much improved    Lipid/Liver March 2026 with Calcium  score  F/U cardiology in a year    Signed: Maude Emmer 02/24/2024, 8:41 AM

## 2024-02-20 ENCOUNTER — Other Ambulatory Visit: Payer: Self-pay | Admitting: Pulmonary Disease

## 2024-02-24 ENCOUNTER — Encounter: Payer: Self-pay | Admitting: Cardiovascular Disease

## 2024-02-24 ENCOUNTER — Ambulatory Visit: Attending: Cardiovascular Disease | Admitting: Cardiovascular Disease

## 2024-02-24 VITALS — BP 118/72 | HR 72 | Ht 61.0 in | Wt 181.6 lb

## 2024-02-24 DIAGNOSIS — E785 Hyperlipidemia, unspecified: Secondary | ICD-10-CM | POA: Diagnosis not present

## 2024-02-24 DIAGNOSIS — I2510 Atherosclerotic heart disease of native coronary artery without angina pectoris: Secondary | ICD-10-CM | POA: Diagnosis not present

## 2024-02-24 DIAGNOSIS — I452 Bifascicular block: Secondary | ICD-10-CM

## 2024-02-24 NOTE — Patient Instructions (Signed)
 Medication Instructions:  Your physician recommends that you continue on your current medications as directed. Please refer to the Current Medication list given to you today.  *If you need a refill on your cardiac medications before your next appointment, please call your pharmacy*  Follow-Up: At [facility], you and your health needs are our priority.  As part of our continuing mission to provide you with exceptional heart care, our providers are all part of one team.  This team includes your primary Cardiologist (physician) and Advanced Practice Providers or APPs (Physician Assistants and Nurse Practitioners) who all work together to provide you with the care you need, when you need it.  Your next appointment:   1 year  Provider:   Maude Emmer, MD

## 2024-02-27 ENCOUNTER — Ambulatory Visit: Admitting: Psychiatry

## 2024-02-27 DIAGNOSIS — F3162 Bipolar disorder, current episode mixed, moderate: Secondary | ICD-10-CM

## 2024-02-27 NOTE — Progress Notes (Signed)
 [facility]or/Therapist Progress Note  Patient ID: Brittany [name], MRN: [id number],    Date: 02/27/2024  Time Spent: 52 minutes   Treatment Type: Individual Therapy  Reported Symptoms:   anxiety, irritability, recovering from knee surgery, sensitivity to perceived rejection, overthinking, relationship/friendship issues    Mental Status Exam:  Appearance:   Casual     Behavior:  Appropriate, Sharing, and Motivated  Motor:  Recovering from knee surgery  Speech/Language:   Clear and Coherent  Affect:  Anxiety, some depressed  Mood:  anxious and depressed  Thought process:  goal directed  Thought content:    Rumination  Sensory/Perceptual disturbances:    WNL  Orientation:  oriented to person, place, time/date, situation, day of week, month of year, year, and stated date of Sept. 11, 2025  Attention:  Good  Concentration:  Good  Memory:  WNL  Fund of knowledge:   Good  Insight:    Good and Fair  Judgment:   Good and Fair  Impulse Control:  Good   Risk Assessment: Danger to Self:  No Self-injurious Behavior: No Danger to Others: No Duty to Warn:no Physical Aggression / Violence:No  Access to Firearms a concern: No  Gang Involvement:No   Subjective:   Patient working further today in session on her anxiety, frustrations, irritability, overthinking, relationship concerns, making frequent assumptions that may not be accurate, and very sensitive to any perceived rejection. Working further today on her negative assumptions and recognizing them/catching them/and choosing how to best manage them. Some continued interpersonal issues amongst primarily 1 other resident at her senior adult apartments.Talking through some of her personal challenges especially at her apt building. Continued work on some control issues. Working ot have better boundaries and Manufacturing systems engineer. Encouraged her in developing more positive interaction skills and letting go of often assuming the  negatives. Getting better with her PT after knee surgery. Intentionally pointing out some of her positive thoughts and behaviors. Encouraged also her getting outside more . Is making progress on her goals and needs to continue working on letting go of the negatives, work further on relationship issues, projection issues, communication, coping skills, and letting to of the negatives.   Interventions: Cognitive Behavioral Therapy, Solution-Oriented/Positive Psychology, and Ego-Supportive 1) Long Term Goal: Develop and follow through on strategies to reduce symptoms.     Short Term Goal: Reduce anxiety and improve coping skills.     Objective: Learn two new ways of coping with daily stressors.     Objective: Develop strategies for thought distraction when fixating on the future.     Objective: Manage panic episodes. 2) Long Term Goal: Maintain stability of mood     Short Term Goal: Increase ability to manage moods.     Objective: Discuss and resolve troubling personal and interpersonal issues.     Diagnosis:   ICD-10-CM   1. Bipolar 1 disorder, mixed, moderate (HCC)  F31.62      Plan:   Patient continues to work on her treatment goals in session today and particularly her symptoms of anxiety, overthinking, irritability, frustrations, and some interpersonal issues especially with a neighbor in her apartment complex.  Recovering well from her knee replacement surgery.  Not quite as angry as last appointment which is positive.  Continue to work with patient on trying to see more positives in herself and others.  Also reviewed and continues to work on refraining from jumping to conclusions that are always negative.  Practicing healthy interpersonal skills particularly  in her apartment area, working to let go of anger, and better managing frustrations/aggravations/resentments and working to let them go eventually versus hanging onto them.  Encouraged patient in her work with goal-directed behaviors  related to being less negative in her thought patterns, being more self understanding as she recently had knee replacement surgery and that has presented some extra challenges for her, practice better limit setting with other people who are not healthy for her, refrain from jumping to quick conclusions about situations understanding that she may not have all the facts involved, recognize her overthinking more quickly and work to interrupt it, try reducing frequent thoughts of how things have to be in order to be okay, more regular practice of the Pause, work on healthier interaction skills including boundaries, let her faith be a resource of emotional support and stability as well as spiritual, recognize her strengths in addition to her challenges, understand that her anxious thoughts are just thoughts and are not necessarily going to play out in reality the way she might assume or fear.  Patient has continued to work with more openness and trying to see her abilities versus her limitations, recognizing some thoughts that are not helpful to her and trying to move forward to replace those thoughts with ones that are more positive and hopeful and this is a challenge for her.  Brittany [name] is making progress and needs to continue working with goal-directed behaviors that support her moving forward in a more hopeful and healthier direction into her future.  Goal review and progress/challenges noted with patient.  Next appointment within 2 weeks.   Barnie Bunde, LCSW

## 2024-03-09 NOTE — Progress Notes (Signed)
 This encounter was created in error - please disregard.

## 2024-03-10 ENCOUNTER — Encounter: Payer: Self-pay | Admitting: Family Medicine

## 2024-03-10 ENCOUNTER — Ambulatory Visit (INDEPENDENT_AMBULATORY_CARE_PROVIDER_SITE_OTHER): Admitting: Psychiatry

## 2024-03-10 ENCOUNTER — Encounter: Payer: Self-pay | Admitting: Psychiatry

## 2024-03-10 DIAGNOSIS — F5105 Insomnia due to other mental disorder: Secondary | ICD-10-CM

## 2024-03-10 DIAGNOSIS — F3162 Bipolar disorder, current episode mixed, moderate: Secondary | ICD-10-CM | POA: Diagnosis not present

## 2024-03-10 DIAGNOSIS — F4312 Post-traumatic stress disorder, chronic: Secondary | ICD-10-CM | POA: Diagnosis not present

## 2024-03-10 DIAGNOSIS — F4310 Post-traumatic stress disorder, unspecified: Secondary | ICD-10-CM

## 2024-03-10 DIAGNOSIS — F4001 Agoraphobia with panic disorder: Secondary | ICD-10-CM | POA: Diagnosis not present

## 2024-03-10 DIAGNOSIS — F515 Nightmare disorder: Secondary | ICD-10-CM | POA: Diagnosis not present

## 2024-03-10 DIAGNOSIS — F39 Unspecified mood [affective] disorder: Secondary | ICD-10-CM

## 2024-03-10 NOTE — Progress Notes (Signed)
 Brittany [name] [id number] 09-Sep-1955 68 y.o.     Subjective:   Patient ID:  Brittany [name] is a 68 y.o. (DOB [birth year]) female.  Chief Complaint:  Chief Complaint  Patient presents with   Follow-up   Depression   Anxiety   Fatigue    Brittany [name] presents to the office today for follow-up of long-term depression and anxiety and insomnia.    At visit September 08, 2018 at which time the patient was weaned off of gabapentin  and started on Trileptal  up to twice daily.  She has been very med sensitive and difficult to treat because of that.  When seen September 19, 2018.  No meds were changed.  She stopped oxcarbazapine on her own DT itching.  seen July 2020.  She did not want to try any additional medications despite ongoing depression.  She wanted to stop lithium  despite risk of worsening depression.  She was given instructions about how to do it in the safest manner possible.  Last seen August 12, 2019..   Still taking diazepam  5 TID, mirtazapine  15, doxazosin  increased to 8 mg nightly for nightmares from 6 mg.    10/16/2019 appointment, the following noted: NM much better with increase prazosin to 8 mg HS and anxiety better that was related and tolerated.  Overall anxiety still a problem.  depression and anxiety much worse for a week after 2nd Covid vaccine recently.   Also overall more anxious and agoraphobia is bad.  Does force herself to go to [employer] weekly.   Averaging diazepam  5 mg BID-TID daily.  No drowsiness.  Avoids more at night bc of worsening NM from it. Weird dreams and occ awakens with panic.  Lost another fur baby that's 2 in 1 year early 2021.  $ and health stress.  Dreams are worse than last visit.  No SE.  Mirtazapine  helps sleep generally and much better with it than without it.  Post Covid November 2020. Got significantly better in depression since here.  Prayed a lot about it and thought the lithium  was helping.  Feels better now off it.  Was going through a lot  at the time.  But after a month more peace and joy.   Buspirone  helped.  Diazepam  better than Xanax  which she feels triggered dissociation and irritabilty.  Lost her dog which is hard.  Prayed and that helped.  Oldest GS joined special forces and gave her his car.  Anxiety driving but does it.   Plan: no med changes  02/09/20 appt with the following noted: Went back down on doxazosin  to 4 mg bc of dizziness but it's not better.  She plans to talk to her other doctors about it.. CO anxiety and wanting to take more meds for it.  Anxiety without reason generally. Increased Diazepam  from 5 mg TID to QID end May 2021.   Worsening mirtazapine  also.  Has done some night eating without memory the next day. Doesn't seem to be anxiety causing insomnia.  Plan: Start topiramate  off label for anxiety and weight loss at 25 mg daily and increase gradually to 100 mg daily.  06/21/2020 appointment with the following noted: Had problems with night terrors again with stress around $ and pet sick.  Getting better again.   Still taking topiramate  100 mg started last visit. Lost weight DT stomach problems.  Doctors cannot explain.  Feels like poor absorption of nutrients.  Difficulty tolerating foods.  Dx bacterial overgrowth in small intestine but corrected. Stomach  constantly burns.  Sx were present before topiramate ? Anxiety is OK if stays home but is worse in public.  Meds are helping anxiety.  Not markedly depressed.  Meds are helping.  11/30/20 appt noted: Chronic anxiety averaging ? Amount daily of diazepam .  Anxiety is higher out of the house.  Gets out of the house regularly to help care for 7 mos old GS.  D took her to beach last week.   Has 5 gallon fish tank with some guppies.   Occ night terrrors. Occ EMA Taking doxazosin  6 mg HS, 4 mg was less effective. Generally more trouble with anxiety than depres but some depression occ with $ stress and despise where she lives.   Ketogenic diet helped GI usually  but some pain today.  Haunted with medical bills. Had Covid hosp 11 days and then home with O2 for a month.  Pulm said don't get Covid again.  Keeps her from going to church regularly but that would help. Plan: Also topiramate  Continue buspirone  15 mg 3 times daily,  and mirtazapine  30 mg nightly for sleep.  Has been compliant with Valium .  She asks about an increase to increase.  OK continue Valium  5 mg  QID prn but take LED. Cont buspirone  15 mg TID, doxazosin  6-8 mg HS  03/29/2021 appointment with the following noted: Has aquarium. Doing OK except some seasonal depression and irritability noticed. Increased doxazosin  between 6-8 mg HS.  Still having weird dreams but not having NM or night terrrors usually. No med changes  09/27/2021 appointment with the following noted: A lot better than normal Oct=Feb.  New GS, D married and some things to look forward to but still some anniversaries of losses.  Helping D with 61 mo old GS helps her a lot.  But did gain weight over the winter.  Dx CAD and put on Statin with cog and depressive and anxiety SE.  Current one not as bad but a little more anxiety and using a little more diazepam .  Nervous about people working in her appt. Going to [facility] on plane for first time with her family.  Excited but nervous too.   Plan no med changes  01/25/2022 appointment noted: No concerns about psych meds except Chronic $ stress.  Has to make choices about essentials.  I put myself into dept living on credit cards. Will go to sleep but EMA 40% of time despite mirtazapine .  Not usually a napper but on occ.    Can't tolerate statins. Even with CoQ10. Chronic med sensitivity.  Brought Genesight test. Got SI about 2-4 weeks into statin and better off it after 3-4 weeks off it. Minimal depression at this time.  Some chronic anxiety which is worse than depression. Disc problematic night eating.  07/02/2022 phone call complaining of high depression and anxiety.   Appointment was moved up.  07/03/22 appointment noted: Current psych meds diazepam  5 mg every 6 hours as needed, mirtazapine  45 mg nightly, buspirone  15 mg 3 times daily, doxazosin  4 to 8 mg nightly prn. Thinks mood got worse when on statins.  Stopped it in summer.  Then took red yeast rice and had a little with it too.   Gained wt in summer from foot surgery.  $ stress from living on credit cards. Complaining of much worse depression and anxiety.  Increased SI persistent like 10-15 years ago. Don't like crowds.  Frustrated and irritable. Thoutghts jumbled. Was at diazepam  5 mg 3 daily and now 4 daily.  At times has taken 10 mg at a time. Plans to restart therapy.  Wants Christian therapist. Chronically fearful of psych meds and doesn't want to start something extra. Still a lot of GI problems. Plan: DC mirtazapine . Caplyta 42 mg HS trial  07/31/22 TC stopped Caplyta and doesn't want to try another med.  Complained of dizziness and being cold with it.  Didn't make her sleepy.    09/04/22 appt noted: Resumed mirtazapine  45 HS, diazepam  5 , buspirone  15 TID, doxazosine 4-8 mg HS. NM occ but less severe.   Still has some EMA.  She doesn't want to change doses. Seeing therapist has helped her cope and mood and anxiety is better.  She doesn't feel she needs more meds or other meds.  No severe mood swings.  Not severely depressed.  Anxiety manageable usually.   Was more irritable when visited D and 2yo grandson.  Did miss meds more while there. No SE with current meds. Insurance will cover 100 days of meds. I think I have a control problem.   Plan no changes  01/01/23 appt noted: Meds as above:  buspirone  15 TID, diazepam  5-10 mg TID prn, doxazosin  4-8 mg HS, mirtazapine  45 HS. Anxiety worse in the am.  Has increased diazepam  use and feels it is less effective.  More $ strain DT inflation & credit card debt and trying to get out of the problem. Lost 30#.  Disc wt loss and temp sensitivity.   Stopped bread and sugar and dairy.   Tries to walk her dog daily.   Working on heart health bc genetics.   Night terrors stopped and reduced doxazosin  but didn't slep as well.  So went back up and didn't solve the problems.   More EMA than before.  Sometimes naps an hour.   Mood rollercoaster.  This weekend dep with SI without reason. New cholesterol med; not a statin.  HX statins with strong SI. Mind constantly racing on random things with some worry related to health and $. At times dep worse than anxiety.   Thinks mirtazapine  may help her fall asleep faster.  Doesn't sleep any better with less than 45 mg mirtazapine .   Plan: Continue buspirone  15 mg 3 times daily,  mirtazapine  per her request at 22.5-45 mg HS.  SABRA Switch clonazepam  0.5 mg BID & 1mg  hS Continue Vitamin D  Doxazosin  8 mg HS  03/05/23 appt noted: Re: switch from diazepam  to clonazepam  I wish you had switched me years ago. Slowed down racing thoughts.  Sleep is a little better.  Not as anxious when awaken at night.  Usually anxiety is worse in the am and not as bad.  Response to clonazepam  is smoother.  Not as sensitive to a missed dose.  Family under a lot of stress right now.  D dx with aggressive breast Ca.  D married with a 21 yo son.  D more anxious and dep and pending surgery and chemo.  Pending double mastectomy and radiation.  She is first person pt knows personally with this.   Plan no changes  06/14/23 appt noted: Still in therapy with Marval Bunde LCSW. Psych meds: as above D battling breast CA. Pretty good considering re: mental health.  Increased vitamin D  for the winter. Low Motivation and impulsive eating.  Fought so hard to lose wt and realized genetics for high cholesterol.  Working to change mind set.   Better focus and less mind racing with clonazepam .  Occ night without sleep.  Last night EMA 2 AM and back to sleep at 6.  Now about once every couple weeks with clonazepam .   Doxazosin  stopped NM and  night terrors.  Only a couple in last 3 mos. Nilsa is oldest D's 88 th birthday; she is the one who only lived 2 days.  Yesterday was a bad day.   Hasn't read much in years.  But plans to get back into it.   Wonders about hypnosis for eating problems and impulse control with spending.   Has made progress on latter. No mania.  Plan: Continue buspirone  15 mg 3 times daily,  mirtazapine  per her request at 22.5-45 mg HS.  SABRA Better with Switch clonazepam  0.5 mg BID & 1mg  hS vs diazepam . Continue Vitamin D  Continue doxazosin  8 mg HS for NM  10/15/23 appt note: Med as above. Not sad nor dep but not much motivated to do chores.  Off and on for years but steady for mos.  Seems to get worse.   Out of work since 2010.   Anxiety managed pretty well. Not sleepy with clonazepam .   Sleep pretty good.  NM none for awhile until recent when woke in panic.  Sometimes triggered by TV program or her sister.  A whole lot better. Rare daytime panic triggered.  12/17/23 appt note: Med:  buspirone  15 TID, clonazepam  0.25 mg BID and 1 mg HS, doxazosin  4-8 mg HS, mirtazapine  45 HS.  Wellbutrin  XL 150 AM Took Wellbutrin  300 for a couple of weeks but SE excessive sweating.  Also didn't notice benefit. Reduced to 150 mg daily until now.  No change with it.   Sweating is better with less but not gone.  Not real down but not very motivated nor productivity.   Plays Bingo weekly and coffee time gathering where she lives weekly.  Trouble conc on phone games.   Asthma flare with heat.  Chronic GI issues.   TKR pending next week.  No current pain meds.  Disc pending PT.   Would like help with conc, inattention, low interest.   Anxiety is a lot better with clonazepam .  And sleep better Sleep variable with a little longer to sleep than with diazepam .  Rare EMA  03/10/24 appt noted: Med; buspirone  15 TID, clonazepam  0.25 mg BID and 1 mg HS, doxazosin  4-8 mg HS, mirtazapine  45 HS.  Wellbutrin  XL 150 AM TKR going ok.   Been so irritable and emotionally sensitive since surgery.   So exhausted all the time.  But a lot going on in the family too. D declared CA free.  Her F CA stomach.   Still care about him.   I'm very empathetic and take on emotions of others.  Working on this with therapist Marval Bunde.   Some inattention issues.  All over the place now.  Her furbaby injured leg and vet bills.  Can't walk with him.   Trying to move around and doing things for about 15-30 min at time. Not sleeping good bc hard to turn her brain off.  No full NM but some strange dreams that are stressful.  Sometimes of people that have traumatized me.   Used nebulizer this morning makes her shakey. $ stress and shops at food banks.  Would like more energy and better focus. Has 68 yo GS.     Worries about weight gain with meds generally fearful of meds.  Chronic $ problems.  Stopped therapy about September 2020. Out of work since 2010.  Multiple med failures.  Med sensitive & cost limit options. restoril, diazepam  is ok. NO MORE Xanax  DT dissociation and irritability,   Trazodone NR, Ambien, hyrdroxyzine, prazosin, doxazosin ,  propranolol,  mirtazapine  45 sertraline, fluoxetine NR, Lexapro,   Buspar  15 TID, gabapentin , Wellbutrin  25 y ago helped stop smoking.  Retry SE sweating at 300 Latuda, Olanzapine, risperidone with AE, Seroquel SE, Geodon, Saphris,  Abilify, Rexulti seemed to help for short while but $, Caplyta 42 dizzy  Trileptal  itching, lithium  NR, CBZ SE, Lamotrigine, VPA,  History statins strong SI  Review of Systems:  Review of Systems  Constitutional:  Positive for fatigue.  HENT:  Negative for congestion.   Respiratory:  Positive for shortness of breath and wheezing.   Gastrointestinal:  Negative for abdominal distention.  Musculoskeletal:  Positive for arthralgias and back pain.  Neurological:  Negative for dizziness, tremors and light-headedness.  Psychiatric/Behavioral:  Positive for sleep  disturbance. Negative for agitation, behavioral problems, confusion, decreased concentration, dysphoric mood, hallucinations, self-injury and suicidal ideas. The patient is nervous/anxious. The patient is not hyperactive.        Please refer to HPI  Stress eating.  Medications: I have reviewed the patient's current medications.  Current Outpatient Medications  Medication Sig Dispense Refill   acetaminophen  (TYLENOL ) 500 MG tablet Take 500-1,000 mg by mouth every 6 (six) hours as needed (pain.).     albuterol  (PROVENTIL ) (2.5 MG/3ML) 0.083% nebulizer solution Take 3 mLs (2.5 mg total) by nebulization every 6 (six) hours as needed for wheezing or shortness of breath. 75 mL 12   Albuterol  Sulfate (PROAIR  RESPICLICK) 108 (90 Base) MCG/ACT AEPB Inhale 2 puffs into the lungs every 6 (six) hours as needed. 1 each 5   aluminum hydroxide-magnesium  carbonate (GAVISCON ) 95-358 MG/15ML SUSP Take 15-30 mLs by mouth as needed. 355 mL 0   Ascorbic Acid  (VITA-C PO) Take 1 tablet by mouth daily.     Azelastine  HCl 137 MCG/SPRAY SOLN SPRAY 1 SPRAY IN EACH NOSTRIL TWICE DAILY 30 mL 5   azithromycin  (ZITHROMAX ) 250 MG tablet Take as directed 6 tablet 0   busPIRone  (BUSPAR ) 15 MG tablet Take 1 tablet (15 mg total) by mouth 3 (three) times daily. 270 tablet 1   cetirizine  (ZYRTEC ) 10 MG tablet Take 1 tablet (10 mg total) by mouth daily. 90 tablet 1   Cholecalciferol (VITAMIN D -3 PO) Take 5,000 Units by mouth daily.     clonazePAM  (KLONOPIN ) 0.5 MG tablet TAKE 1 TABLET BY MOUTH 2 TIMES A DAY AND TAKE TWO TABLETS BY MOUTH AT BEDTIME (Patient taking differently: Taking 1/2 tablet  BID and 2 tablets at night) 360 tablet 0   dicyclomine  (BENTYL ) 10 MG capsule Take 1 capsule (10 mg total) by mouth as needed for spasms. 90 capsule 2   docusate sodium  (COLACE) 100 MG capsule Take 1 capsule (100 mg total) by mouth 2 (two) times daily. 60 capsule 2   doxazosin  (CARDURA ) 4 MG tablet Take 2 tablets (8 mg total) by mouth at  bedtime. 180 tablet 0   Evolocumab  (REPATHA  SURECLICK) 140 MG/ML SOAJ Inject 140 mg into the skin every 14 (fourteen) days. 6 mL 3   ezetimibe  (ZETIA ) 10 MG tablet TAKE 1 TABLET BY MOUTH DAILY 90 tablet 1   famotidine  (PEPCID ) 20 MG tablet Take 1 tablet (20 mg total) by mouth 2 (two) times daily. 60 tablet 0   Ferrous Sulfate  (IRON PO) Take 1 tablet by mouth daily.     fluticasone  (FLONASE ) 50 MCG/ACT nasal spray  USE 2 SPRAYS INTO BOTH NOSTRILS TWICE DAILY (Patient taking differently: Place 2 sprays into both nostrils in the morning and at bedtime.) 96 mL 2   fluticasone -salmeterol (ADVAIR) 250-50 MCG/ACT AEPB Inhale 1 puff into the lungs every 12 (twelve) hours. 180 each 1   HYDROcodone-acetaminophen  (NORCO) 10-325 MG tablet Take 1 tablet every 4 hours by oral route as needed for 7 days.     ibuprofen (ADVIL) 200 MG tablet Take 200-400 mg by mouth every 8 (eight) hours as needed (pain.).     MAGNESIUM  PO Take 1 tablet by mouth daily.     methocarbamol  (ROBAXIN ) 500 MG tablet Take 1 tablet (500 mg total) by mouth every 8 (eight) hours as needed for muscle spasms. 30 tablet 1   mirtazapine  (REMERON ) 45 MG tablet Take 1 tablet (45 mg total) by mouth at bedtime. 90 tablet 1   montelukast  (SINGULAIR ) 10 MG tablet TAKE 1 TABLET BY MOUTH AT BEDTIME 100 tablet 0   Multiple Vitamin (MULTIVITAMIN WITH MINERALS) TABS tablet Take 1 tablet by mouth daily with breakfast.     Omega-3 Fatty Acids (OMEGA 3 PO) Take 1 capsule by mouth daily.     pantoprazole  (PROTONIX ) 40 MG tablet Take 1 tablet (40 mg total) by mouth 2 (two) times daily. Take 30 min to 1 hr before morning coffee and dinner. 90 tablet 3   polyethylene glycol (MIRALAX  / GLYCOLAX ) 17 g packet Take 17 g by mouth daily. 14 each 0   Scar Treatment Products (SCAR EX) Apply 1 Application topically as needed (bruises).     sodium chloride  (OCEAN) 0.65 % SOLN nasal spray Place 1 spray into both nostrils in the morning and at bedtime.     Vitamin D -Vitamin  K (D3 + K2 PO) Take 1 tablet by mouth daily.     HYDROmorphone  (DILAUDID ) 2 MG tablet Take 1-2 tablets (2-4 mg total) by mouth every 4 (four) hours as needed for severe pain (pain score 7-10). (Patient not taking: Reported on 03/10/2024) 40 tablet 0   predniSONE  (DELTASONE ) 10 MG tablet Take 4 tablets (40 mg total) by mouth daily with breakfast. (Patient not taking: Reported on 03/10/2024) 20 tablet 0   No current facility-administered medications for this visit.    Medication Side Effects: None  Allergies:  Allergies  Allergen Reactions   Aspirin  Other (See Comments)    discomfort   Atorvastatin  Other (See Comments)    Patient states she felt suicidal   Codeine Itching   Crestor  [Rosuvastatin ] Other (See Comments)    Dizzy, leg pain and states she felt suicidal   Gabapentin  Other (See Comments)   Niacin And Related Itching   Sulfa Antibiotics Other (See Comments)    Other reaction(s): Unknown   Sulfamethoxazole-Trimethoprim Other (See Comments)    Unknown reaction per pt   Topiramate  Other (See Comments)    Per patient, it causes stomach burning   Trileptal  [Oxcarbazepine ] Itching    itching   Trimethoprim Other (See Comments)    Unknown, per pt   Ultram [Tramadol] Itching   Vioxx [Rofecoxib] Other (See Comments)    Unknown per pt    Past Medical History:  Diagnosis Date   Abnormal EKG    Allergy    Anxiety    Arthritis    Asthma    Bipolar depression (HCC), Anxiety, PTSD, Panic disorder    -managed by [facility]   CAD (coronary artery disease)    COPD (chronic obstructive pulmonary disease) (HCC)    Depression  Foot pain    Gallstones    GERD (gastroesophageal reflux disease)    Heart murmur    as a child   HLD (hyperlipidemia)    Insomnia    Leg swelling    Obesity    Osteoporosis    osteopenia   Pneumonia due to COVID-19 virus 04/22/2019   Tachycardia     Family History  Problem Relation Age of Onset   Hypertension Mother     Hyperlipidemia Mother    Congestive Heart Failure Mother    Other Father        killed   COPD Brother    Heart disease Brother    Pulmonary embolism Brother    Colon polyps Maternal Aunt    COPD Paternal Aunt        a lot of aunts and uncle COPD or Emphysema   Emphysema Paternal Uncle    Breast cancer Daughter    Colon cancer Neg Hx    Esophageal cancer Neg Hx    Pancreatic cancer Neg Hx    Stomach cancer Neg Hx    Liver disease Neg Hx    Rectal cancer Neg Hx     Social History   Socioeconomic History   Marital status: Single    Spouse name: Not on file   Number of children: 3   Years of education: Not on file   Highest education level: Associate degree: occupational, Scientist, product/process development, or vocational program  Occupational History   Occupation: disabled  Tobacco Use   Smoking status: Former    Current packs/day: 0.00    Average packs/day: 20.0 packs/day for 1 year (20.0 ttl pk-yrs)    Types: Cigarettes    Start date: 06/18/1998    Quit date: 06/19/1999    Years since quitting: 24.7   Smokeless tobacco: Never   Tobacco comments:    20 year estimate but probably less   Vaping Use   Vaping status: Never Used  Substance and Sexual Activity   Alcohol use: Yes    Comment: Rare   Drug use: No   Sexual activity: Never  Other Topics Concern   Not on file  Social History Narrative   Work or School: Disabled seconndary to psychiatric conditions      Home Situation: lives alone with 2 cats and one dog      Spiritual Beliefs: Christian      Lifestyle: no regular exercise, diet  Not great - wants to embark on healthier lifestyle   Social Drivers of Health   Financial Resource Strain: Medium Risk (12/01/2023)   Overall Financial Resource Strain (CARDIA)    Difficulty of Paying Living Expenses: Somewhat hard  Food Insecurity: Patient Declined (12/25/2023)   Hunger Vital Sign    Worried About Running Out of Food in the Last Year: Patient declined    Ran Out of Food in the Last Year:  Patient declined  Recent Concern: Food Insecurity - Food Insecurity Present (12/01/2023)   Hunger Vital Sign    Worried About Running Out of Food in the Last Year: Sometimes true    Ran Out of Food in the Last Year: Sometimes true  Transportation Needs: No Transportation Needs (12/25/2023)   PRAPARE - Administrator, Civil Service (Medical): No    Lack of Transportation (Non-Medical): No  Physical Activity: Insufficiently Active (12/01/2023)   Exercise Vital Sign    Days of Exercise per Week: 2 days    Minutes of Exercise per Session: 30 min  Stress: Stress Concern Present (12/01/2023)   [employer] of Occupational Health - Occupational Stress Questionnaire    Feeling of Stress: To some extent  Social Connections: Moderately Integrated (12/25/2023)   Social Connection and Isolation Panel    Frequency of Communication with Friends and Family: Three times a week    Frequency of Social Gatherings with Friends and Family: Once a week    Attends Religious Services: More than 4 times per year    Active Member of [employer] or Organizations: No    Attends Banker Meetings: 1 to 4 times per year    Marital Status: Divorced  Recent Concern: Social Connections - Moderately Isolated (12/01/2023)   Social Connection and Isolation Panel    Frequency of Communication with Friends and Family: Three times a week    Frequency of Social Gatherings with Friends and Family: Once a week    Attends Religious Services: More than 4 times per year    Active Member of [employer] or Organizations: No    Attends Engineer, structural: Not on file    Marital Status: Divorced  Intimate Partner Violence: Not At Risk (12/25/2023)   Humiliation, Afraid, Rape, and Kick questionnaire    Fear of Current or Ex-Partner: No    Emotionally Abused: No    Physically Abused: No    Sexually Abused: No    Past Medical History, Surgical history, Social history, and Family history were reviewed and  updated as appropriate.   Divorced twice.  Please see review of systems for further details on the patient's review from today.   Objective:   Physical Exam:  There were no vitals taken for this visit.  Physical Exam Constitutional:      General: She is not in acute distress. Musculoskeletal:        General: No deformity.  Neurological:     Mental Status: She is alert and oriented to person, place, and time.     Cranial Nerves: No dysarthria.     Coordination: Coordination normal.  Psychiatric:        Attention and Perception: Attention and perception normal. She does not perceive auditory or visual hallucinations.        Mood and Affect: Mood is anxious. Mood is not depressed. Affect is not labile or inappropriate.        Speech: Speech normal. Speech is not rapid and pressured or slurred.        Behavior: Behavior normal. Behavior is cooperative.        Thought Content: Thought content normal. Thought content is not paranoid or delusional. Thought content does not include homicidal or suicidal ideation. Thought content does not include suicidal plan.        Cognition and Memory: Cognition and memory normal.        Judgment: Judgment normal.     Comments: Insight and judgment fair.  not overtly manic.  chronic $ stress Anxiety and depression are better not gone.  CC low motivation. Talkative Racing thoughts mainly at night.     Lab Review:     Component Value Date/Time   NA 138 12/28/2023 0353   NA 141 11/17/2021 0835   K 3.6 12/28/2023 0353   CL 98 12/28/2023 0353   CO2 33 (H) 12/28/2023 0353   GLUCOSE 128 (H) 12/28/2023 0353   BUN 11 12/28/2023 0353   BUN 16 11/17/2021 0835   CREATININE 0.57 12/28/2023 0353   CREATININE 0.94 04/06/2020 0916   CALCIUM  8.4 (L)  12/28/2023 0353   PROT 6.0 (L) 12/28/2023 0353   PROT 6.3 05/22/2023 1044   ALBUMIN 3.1 (L) 12/28/2023 0353   ALBUMIN 4.4 05/22/2023 1044   AST 12 (L) 12/28/2023 0353   ALT 10 12/28/2023 0353   ALKPHOS  43 12/28/2023 0353   BILITOT 0.5 12/28/2023 0353   BILITOT 0.3 05/22/2023 1044   GFRNONAA >60 12/28/2023 0353   GFRAA >60 05/01/2019 0155       Component Value Date/Time   WBC 5.7 12/28/2023 0353   RBC 3.39 (L) 12/28/2023 0353   HGB 10.4 (L) 12/28/2023 0353   HCT 31.6 (L) 12/28/2023 0353   PLT 234 12/28/2023 0353   MCV 93.2 12/28/2023 0353   MCH 30.7 12/28/2023 0353   MCHC 32.9 12/28/2023 0353   RDW 13.2 12/28/2023 0353   LYMPHSABS 0.8 12/26/2023 0941   MONOABS 0.9 12/26/2023 0941   EOSABS 0.0 12/26/2023 0941   BASOSABS 0.0 12/26/2023 0941    No results found for: POCLITH, LITHIUM    Lab Results  Component Value Date   VALPROATE 25.3 (L) 10/26/2008     .res Assessment: Plan:    Hephzibah was seen today for follow-up, depression, anxiety and fatigue.  Diagnoses and all orders for this visit:  Bipolar 1 disorder, mixed, moderate (HCC)  PTSD (post-traumatic stress disorder)  Episodic mood disorder  Panic disorder with agoraphobia  Nightmares associated with chronic post-traumatic stress disorder  Insomnia due to mental condition    Chronic noncompliance complicates treatment but bettter lately Likey borderline personality disorder vs PTSD complication.  30 min face to face time with patient. We discussed her chronic sx of depression, anxiety and insomnia combined with medication sensitivity which makes it very difficult to treat her.  Multiple med failures.  Limited insight into bipolar vs PD and has refused mood stabilizers.  This was discussed with her repeatedly. But she has not had manic episodes off mood stabilizers. Seems unusual that she's not having manic episodes apparently without mood stabilizer.  ? Racing thought related but not real prominent euphoria or irritability.  Cycles of dep.    May change dx back to major depression. Her faith helps. Still not reporting manic phases.    She has failed to respond to multiple SSRIs and other psychiatric  medications.    Out of work since 2010.    Option clonidine  We discussed the short-term risks associated with benzodiazepines including sedation and increased fall risk among others.  She reports better anxiety control with diazepam  as opposed to Xanax  intially until now.   She does not want to be prescribed Xanax  at any point in the future bc history of dissociation with it.   Discussed long-term side effect risk including dependence, potential withdrawal symptoms, and the potential eventual dose-related risk of dementia but 2020 studies  Advantage of clonazepam  which probably does have some mild mood stabilizing effects.   For energy motivation later option trial modafinil or Fetzima for focus and motivation.   Anxiety managed.  Option Viibryd for anxiety.  T4 tier and she doesn't think she can afford.  Only options are generic.  Encourage reasonable exercise to help improve her energy and cognition.  Maximal activity would help mental health.   Reviewed [facility] results in detail previously.  Continue buspirone  15 mg 3 times daily,   mirtazapine  per her request at 22.5-45 mg HS.  .  clonazepam  0.5 mg BID & 1mg  hS   Continue Vitamin D   Continue doxazosin  4-8 mg HS for NM  No med changes are obvious  FU 3-4 mos  Lorene Macintosh, MD, DFAPA   Please see After Visit Summary for patient specific instructions.  Future Appointments  Date Time Provider Department Center  03/13/2024 10:00 AM Sherlynn Sober, LCSW [facility] None  03/27/2024 10:00 AM Sherlynn Sober, LCSW [facility] None  04/10/2024 10:00 AM Sherlynn Sober, LCSW [facility] None  04/24/2024 10:00 AM Sherlynn Sober, LCSW [facility] None  05/08/2024 10:00 AM Sherlynn Sober, LCSW [facility] None  05/21/2024 10:50 AM [facility]-ANNUAL WELLNESS VISIT [facility] [address]  05/22/2024 10:00 AM Sherlynn Sober, LCSW [facility] None    No orders of the defined types were placed in this encounter.     -------------------------------

## 2024-03-11 DIAGNOSIS — M25561 Pain in right knee: Secondary | ICD-10-CM | POA: Diagnosis not present

## 2024-03-13 ENCOUNTER — Ambulatory Visit: Admitting: Psychiatry

## 2024-03-13 DIAGNOSIS — M25561 Pain in right knee: Secondary | ICD-10-CM | POA: Diagnosis not present

## 2024-03-13 DIAGNOSIS — F3162 Bipolar disorder, current episode mixed, moderate: Secondary | ICD-10-CM | POA: Diagnosis not present

## 2024-03-13 NOTE — Progress Notes (Signed)
 [facility] Counselor/Therapist Progress Note  Patient ID: Brittany [name], MRN: [id number],    Date: 03/13/2024  Time Spent: 55 minutes  Treatment Type: Individual Therapy  Reported Symptoms: Anxiety, continues to recover from the surgery, sensitive to perceived rejection, overthinking, irritability, relationship/friendship issues, some depression     Mental Status Exam:  Appearance:   Casual and Neat     Behavior:  Appropriate, Sharing, and Motivated  Motor:  Recovering from knee surgery  Speech/Language:   Clear and Coherent  Affect:  Some anxiety and depression  Mood:  anxious and irritable  Thought process:  goal directed  Thought content:    Rumination  Sensory/Perceptual disturbances:    WNL  Orientation:  oriented to person, place, time/date, situation, day of week, month of year, year, and stated date of Sept. 26, 2025  Attention:  Good  Concentration:  Good and Fair  Memory:  WNL  Fund of knowledge:   Good  Insight:    Good and Fair  Judgment:   Good  Impulse Control:  Good/Fair   Risk Assessment: Danger to Self:  No Self-injurious Behavior: No Danger to Others: No Duty to Warn:no Physical Aggression / Violence:No  Access to Firearms a concern: No  Gang Involvement:No   Subjective:   Patient in session today focusing more on her anxiety, overthinking, irritability, frustrations, relationship concerns, making assumptions that may not be accurate, and very sensitive to any perceived rejection. Further processing today some family and neighbor concerns/issues. Motivated, talking openly, still gradually working on her PT in recovering from knee surgery. Is sensing some healing physically and very gradual some emotional healing, which we discussed in session some today. Continued work on making quick negative assumptions and redirect her thoughts in a more positive direction. Working to feel better about herself and be more aware of her words and actions when  interacting with others. Daughter is now cancer-free and rang the bell which she shared a brief video on her phone of daughter ringing the cancer bell surrounded by family and friends. Still some challenges at her apartment building. Showing some increased motivation today and being able to recognize some positives along with her challenges. Working on improved interaction skills with others and not being too quick in her assumptions. Reviewed some progress with her related to communication, coping skills, tendency to sometimes have difficulty in active listening, letting go of negatives, and challenges in relationships.   Interventions: Cognitive Behavioral Therapy, Solution-Oriented/Positive Psychology, and Ego-Supportive 1) Long Term Goal: Develop and follow through on strategies to reduce symptoms.     Short Term Goal: Reduce anxiety and improve coping skills.     Objective: Learn two new ways of coping with daily stressors.     Objective: Develop strategies for thought distraction when fixating on the future.     Objective: Manage panic episodes. 2) Long Term Goal: Maintain stability of mood     Short Term Goal: Increase ability to manage moods.     Objective: Discuss and resolve troubling personal and interpersonal issues.    Diagnosis:   ICD-10-CM   1. Bipolar 1 disorder, mixed, moderate (HCC)  F31.62      Plan:    Patient today working more consistently in session today, sorting through some challenges as well as recognizing some progress as well. Good focus on treatment goals related to her overthinking, irritability, anxiety, interpersonal issues, and frustrations.  Does continue to recover from her recent knee replacement surgery.  Less anger  and resentment today.  Continue to work with her on not overly dwelling on the negatives and trying to keep her eyes open more for the positives in herself and and others in her life.  Continued work on not jumping to conclusions that are always  negative and often inaccurate.  Encouraged her in the practice of more healthy interpersonal skills within the family, outside the family and within her apartment complex area.  Trying to better manage frustration and aggravations rather than holding onto them.  Did seem to respond better to therapist efforts to have her more engaged and motivated today. Reminded and encouraged patient and working with goal-directed behaviors related to being less negative in her thought patterns, being more self understanding as she recently had knee replacement surgery and that has presented some extra challenges for her, practice better limit setting with other people who are not healthy for her, refrain from jumping too quick to conclusions about situations understanding that she may not have all the facts involved, recognize her overthinking more quickly and work to interrupt it, try reducing frequent thoughts of how things have to be in order to be okay, more regular practice of the pause, work on healthier interaction skills including boundaries, let her faith be a resource of emotional support and stability as well as spiritual, recognize her strengths in addition to her challenges, understand that her anxious thoughts are just thoughts and are not necessarily going to play out in reality the way she might assume or fear.  Continue to encourage patient to work with more openness and be able to see her abilities versus limitations, recognizing some thoughts that are not helpful for her and trying to move forward and replace those thoughts with ones that are more positive and hopeful, which is a challenge for her.  Tynisha Ogan has shown progress and needs to continue her work with goal-directed behaviors as she tries to keep moving forward in a more hopeful and healthier direction heading into her future.  Goal review and progress/challenges noted with patient.  Next appointment within 2 weeks.   Barnie Bunde,  LCSW

## 2024-03-16 ENCOUNTER — Telehealth: Payer: Self-pay | Admitting: Internal Medicine

## 2024-03-16 NOTE — Telephone Encounter (Signed)
 Received a call from patient requesting refills for her 2 anti-biotics, neomycin  500 mg and doxycycline  100 mg. Please review and advise  Thank you

## 2024-03-17 MED ORDER — NEOMYCIN SULFATE 500 MG PO TABS: 500.0000 mg | ORAL_TABLET | Freq: Two times a day (BID) | ORAL | 0 refills | Status: AC

## 2024-03-17 MED ORDER — DOXYCYCLINE HYCLATE 100 MG PO TABS: 100.0000 mg | ORAL_TABLET | Freq: Two times a day (BID) | ORAL | 0 refills | Status: DC

## 2024-03-17 NOTE — Telephone Encounter (Signed)
Done and patient made aware  

## 2024-03-17 NOTE — Telephone Encounter (Signed)
 Please advise patient is having symptoms she says that her sibo imo act up about 1 every 6 months are so and she would like this. It was last prescribed like back in March she said before her procedure

## 2024-03-25 DIAGNOSIS — M25561 Pain in right knee: Secondary | ICD-10-CM | POA: Diagnosis not present

## 2024-03-27 ENCOUNTER — Ambulatory Visit: Admitting: Psychiatry

## 2024-03-27 DIAGNOSIS — F3162 Bipolar disorder, current episode mixed, moderate: Secondary | ICD-10-CM | POA: Diagnosis not present

## 2024-03-27 NOTE — Progress Notes (Signed)
 [facility] Counselor/Therapist Progress Note  Patient ID: Brittany [name], MRN: [id number],    Date: 03/27/2024  Time Spent: 53 minutes   Treatment Type: Individual Therapy  Reported Symptoms: anxiety, continuing to recover from knee surgery, very sensitive to perceived rejection, overthinking, irritability, relationship/friendship issue, some depression    Mental Status Exam:  Appearance:   Casual     Behavior:  Appropriate, Sharing, and Motivated  Motor:  Normal  Speech/Language:   Clear and Coherent  Affect:  Depressed and anxious  Mood:  anxious and depressed  Thought process:  goal directed  Thought content:    Rumination  Sensory/Perceptual disturbances:    WNL  Orientation:  oriented to person, place, time/date, situation, day of week, month of year, year, and stated date of Oct. 10, 2025  Attention:  Good  Concentration:  Good and Fair  Memory:  WNL  Fund of knowledge:   Good  Insight:    Good and Fair  Judgment:   Fair  Impulse Control:  Fair   Risk Assessment: Danger to Self:  No Self-injurious Behavior: No Danger to Others: No Duty to Warn:no Physical Aggression / Violence:No  Access to Firearms a concern: No  Gang Involvement:No   Subjective:  Patient working further today in session on her overthinking, anxiety, irritability, making assumptions that may not be accurate, relationship issues, frustrations, and remains very sensitive to any perceived rejection.  Looking at our perceptions and how they may not always be accurate, which is challenging for patient. Intentionally spent some time also acknowledging her positives including: I have gotten better about not expecting everybody to be my friend (and discussed this more in session today using chear examples), working on her own self-confidence, trying to not judge others, working to look more for the positives.  Shares that she is spending too much on candy and things I shouldn't be buying and  did discuss strategies for patient to consider in helping her with this. Working further on her interruption of negative thoughts and unhealthy behaviors considering her medical issues and reactions. GOOD Work in session today!  Continues her PT work as she recovers more from her knee surgery. Some moments of feeling better about herself and trying to help patient develop some more positive ways of caring for herself.  Some progress noted in her coping skills, communication, tendency to have difficulty in active listening to others, letting go of negatives, challenges in relationships and trying to have healthy friendships, and working harder on not choosing to do things that she knows can be harmful to her health especially in terms of certain foods as she shared today that doctors have encouraged her not to use because of her gastrointestinal issues.  Focused on some positives with patient and pointing out some of her positives that I see in her and her efforts especially more recently including today in session.  Interventions: Cognitive Behavioral Therapy, Solution-Oriented/Positive Psychology, and Ego-Supportive 1) Long Term Goal: Develop and follow through on strategies to reduce symptoms.     Short Term Goal: Reduce anxiety and improve coping skills.     Objective: Learn two new ways of coping with daily stressors.     Objective: Develop strategies for thought distraction when fixating on the future.     Objective: Manage panic episodes. 2) Long Term Goal: Maintain stability of mood     Short Term Goal: Increase ability to manage moods.     Objective: Discuss and resolve troubling personal  and interpersonal issues.    Diagnosis:   ICD-10-CM   1. Bipolar 1 disorder, mixed, moderate (HCC)  F31.62      Plan: Patient today in session working well and focusing more consistently on some of the tough challenges that she works along with her goals, including healthier decision making, not putting  herself down, and recognizing some of her progress.  Today, patient worked better than she has in a while and she worked until the very last minute of our session.  She did seem to feel better about herself and knowing that she really stayed focused and worked hard the entire session including being able to laugh at herself a couple times appropriately.  Does have some family members that care about her but does not feel like she has a lot of support nor healthy friendships and is willing to work on that.  Encouraged in her efforts to better manage frustration and being able to say no when she needs to say no.  Did well today in terms of being more motivated and more engaged in the session.  I continue to encourage patient in her work with goal-directed behaviors that are related to her being less negative in her thought patterns, being more self understanding as she recently had knee replacement surgery and that has presented some extra challenges for her although is making progress, practice better limit setting with other people who are not healthy for her, refrain from jumping too quick to conclusions about situations and understand that she may not have all the facts involved, recognize her overthinking more quickly and work to interrupt it, try reducing frequent thoughts of how things have to be in order to be okay, more regular practice of the Pauls, work on healthier interaction skills including boundaries, let her faith be a resource of emotional support and stability as well as spiritual, recognize her strengths in addition to her challenges, understand that her anxious thoughts are just thoughts and are not necessarily going to play out in reality the way she might assume or fear.  Will continue to encourage patient and her work to be more open and be able to see her abilities versus limitations, recognizing some thoughts that are not helpful for her and trying to move forward and replace those  thoughts with more positive and hopeful thoughts which is a challenge for patient.  Oyuki Hogan has shown progress and is committed to continuing her work with goal-directed behaviors trying to keep moving in a more hopeful and healthier direction into the future.  Goal review and progress/challenges noted with patient.  Next appointment within 2 weeks.   Barnie Bunde, LCSW

## 2024-04-06 DIAGNOSIS — M25561 Pain in right knee: Secondary | ICD-10-CM | POA: Diagnosis not present

## 2024-04-09 ENCOUNTER — Ambulatory Visit: Admitting: Psychiatry

## 2024-04-10 ENCOUNTER — Ambulatory Visit: Admitting: Psychiatry

## 2024-04-17 ENCOUNTER — Other Ambulatory Visit: Payer: Self-pay | Admitting: Psychiatry

## 2024-04-17 DIAGNOSIS — F5105 Insomnia due to other mental disorder: Secondary | ICD-10-CM

## 2024-04-17 DIAGNOSIS — F4001 Agoraphobia with panic disorder: Secondary | ICD-10-CM

## 2024-04-17 DIAGNOSIS — F4310 Post-traumatic stress disorder, unspecified: Secondary | ICD-10-CM

## 2024-04-20 DIAGNOSIS — M25561 Pain in right knee: Secondary | ICD-10-CM | POA: Diagnosis not present

## 2024-04-23 DIAGNOSIS — M25561 Pain in right knee: Secondary | ICD-10-CM | POA: Diagnosis not present

## 2024-04-24 ENCOUNTER — Ambulatory Visit: Admitting: Psychiatry

## 2024-04-24 DIAGNOSIS — F3162 Bipolar disorder, current episode mixed, moderate: Secondary | ICD-10-CM | POA: Diagnosis not present

## 2024-04-24 NOTE — Progress Notes (Signed)
 [facility] Counselor/Therapist Progress Note  Patient ID: Brittany [name], MRN: [id number],    Date: 04/24/2024  Time Spent: 53 minutes   Treatment Type: Individual Therapy  Reported Symptoms: anxiety, very sensitive to perceived rejection, overthinking, some depression increased recently, some increase in financial stressors   Mental Status Exam:  Appearance:   Casual     Behavior:  Appropriate and Sharing  Motor:  Better since knee surgery  Speech/Language:   Normal Rate  Affect:  Depressed and anxious  Mood:  anxious, depressed  Thought process:  goal directed  Thought content:    Rumination  Sensory/Perceptual disturbances:    WNL  Orientation:  oriented to person, place, time/date, situation, day of week, month of year, year, and stated date of Nov. 7, 2025.  Attention:  Good  Concentration:  Good and Fair  Memory:  WNL  Fund of knowledge:   Good  Insight:    Good  Judgment:   Good  Impulse Control:  Good and Fair   Risk Assessment: Danger to Self:  No Self-injurious Behavior: No Danger to Others: No Duty to Warn:no Physical Aggression / Violence:No  Access to Firearms a concern: No  Gang Involvement:No   Subjective: Patient today working in session trying to reduce her anxiety, irritability, overthinking, relationship issues, making assumptions that may be inaccurate, frustrations, and overly sensitive at times. Is overly sensitive to any perceived rejection which we worked on further today, and remains a challenge for patient. Working with patient on some of her perceptions and assumption and how they are not always accurate. Hard for patient to accept this.  Trying to help patient notice her progress and stay motivated to continue moving in a more positive direction with her goals. States she is trying not to judge others as much and look more for what might go right versus wrong, and continue work on her self-confidence.  Continues to work on stopping negative  thoughts and unhealthy behaviors especially because of the potential impact on her medical issues and reactions.  Still seeing progress as she is healing from her knee replacement surgery and seems to be doing much better at this point.  Progress continues in her challenges to relationships and trying to have healthy friendships, her coping skills, communication, trying to be a more active listener and conversations with others, and trying to choose healthy versus unhealthy activities as well as foods within her diet as doctors have encouraged her because of gastrointestinal issues.  I pointed out some of her positives today that I see in her as she is trying to cope and make better decisions and I feel she needs to hear this from other people as well.  She does have some family members that are supportive also.  Interventions: Cognitive Behavioral Therapy, Solution-Oriented/Positive Psychology, and Ego-Supportive 1) Long Term Goal: Develop and follow through on strategies to reduce symptoms.     Short Term Goal: Reduce anxiety and improve coping skills.     Objective: Learn two new ways of coping with daily stressors.     Objective: Develop strategies for thought distraction when fixating on the future.     Objective: Manage panic episodes. 2) Long Term Goal: Maintain stability of mood     Short Term Goal: Increase ability to manage moods.     Objective: Discuss and resolve troubling personal and interpersonal issues.    Diagnosis:   ICD-10-CM   1. Bipolar 1 disorder, mixed, moderate (HCC)  F31.62  Plan:  Patient in session and showing good effort today as she worked further on some personal issues related to healthier choice making, and saying no to things that she knows she needs to say no to for her own better health.  Working still on tendency to not judge others and not be so quick to arrive at negative conclusions versus positive, and also working on some positives within herself and  especially related to decision making. Encouraged patient also and being more motivated and engaged as she works with goal-directed behaviors that are related to her being less negative and thought patterns, being more self understanding as she recently had knee replacement surgery which has presented some extra challenges for her, practicing better limit setting with other people who are not healthy for her, refrain from jumping too quick to conclusions about situations and understand that she may not have all the facts involved, recognize her overthinking more quickly and work to interrupt it, try reducing frequent thoughts of how things have to be in order to be okay , more regular practice of the Pause, work on healthier interaction skills including boundaries, let her faith be a resource of emotional support and stability as well as spiritual, recognize her strengths in addition to her challenges, understand that her anxious thoughts are just thoughts and are not necessarily going to play out in reality the way she might assume or fear.  Continue to encourage patient in her work to be more open and be able to see her abilities versus just her limitations, recognizing that some thoughts are not helpful for her and trying to move forward and replace those thoughts with more positive and hopeful thoughts which is a challenge for patient.  Brittany [name] has shown some progress and is trying to remain committed to continuing her work with goal-directed behaviors to keep moving in a more hopeful and healthier direction into the future.  Goal review and progress/challenges noted with patient.  Next appointment within 2 weeks.   Barnie Bunde, LCSW

## 2024-04-29 ENCOUNTER — Telehealth: Payer: Self-pay | Admitting: Pulmonary Disease

## 2024-04-29 ENCOUNTER — Other Ambulatory Visit: Payer: Self-pay

## 2024-04-29 ENCOUNTER — Other Ambulatory Visit: Payer: Self-pay | Admitting: Pulmonary Disease

## 2024-04-29 DIAGNOSIS — J4540 Moderate persistent asthma, uncomplicated: Secondary | ICD-10-CM

## 2024-04-29 MED ORDER — FLUTICASONE-SALMETEROL 250-50 MCG/ACT IN AEPB: 1.0000 | INHALATION_SPRAY | Freq: Two times a day (BID) | RESPIRATORY_TRACT | 3 refills | Status: AC

## 2024-04-29 NOTE — Telephone Encounter (Unsigned)
 Copied from CRM [id number]. Topic: Clinical - Medication Refill >> Apr 29, 2024  3:39 PM Joesph PARAS wrote: Medication: fluticasone -salmeterol (ADVAIR) 250-50 MCG/ACT AEPB  Has the patient contacted their pharmacy? Yes - States it was denied but no request appears to be in the system.   This is the patient's preferred pharmacy:  [facility] [id number] - [place], [place] - [address] [place] [address] Phone: [phone] Fax: [phone]  Is this the correct pharmacy for this prescription? Yes If no, delete pharmacy and type the correct one.   Has the prescription been filled recently? No  Is the patient out of the medication? No - Not out yet but getting close.   Has the patient been seen for an appointment in the last year OR does the patient have an upcoming appointment? Yes  Can we respond through MyChart? Yes  Agent: Please be advised that Rx refills may take up to 3 business days. We ask that you follow-up with your pharmacy.

## 2024-04-29 NOTE — Telephone Encounter (Signed)
 Rx sent to pharmacy

## 2024-05-01 MED ORDER — EZETIMIBE 10 MG PO TABS
10.0000 mg | ORAL_TABLET | Freq: Every day | ORAL | 3 refills | Status: AC
Start: 2024-05-01 — End: ?

## 2024-05-01 NOTE — Telephone Encounter (Signed)
 Pt LF 8/15 for 400 tablets, 100 day supply. Per pharmacy today she is day 91 of 100. Pt notified.

## 2024-05-01 NOTE — Telephone Encounter (Signed)
 Patient called in for refill on Clonazepam  0.5mg . States that pharmacy needs approval. Ph: [phone] Appt 12/17 Pharmacy [facility] [address] [place]

## 2024-05-05 NOTE — Telephone Encounter (Signed)
 Pt called back today at 3:57p  stating she thought medication was going to be sent on today.  I read her the message from 11/14 that she had 9 days left 4 days ago and that this medication does not get sent in early.  She said she has enough until Sat and she said she doesn't want to run out.    Next appt 12/17

## 2024-05-07 ENCOUNTER — Other Ambulatory Visit: Payer: Self-pay

## 2024-05-07 NOTE — Telephone Encounter (Signed)
 Addressed thru pharmacy interface.

## 2024-05-08 ENCOUNTER — Ambulatory Visit: Admitting: Psychiatry

## 2024-05-08 DIAGNOSIS — F3162 Bipolar disorder, current episode mixed, moderate: Secondary | ICD-10-CM | POA: Diagnosis not present

## 2024-05-08 NOTE — Progress Notes (Signed)
 [facility] Counselor/Therapist Progress Note  Patient ID: Brittany [name], MRN: [id number],    Date: 05/08/2024  Time Spent: 53 minutes   Treatment Type: Individual Therapy  Reported Symptoms: anxiety, depression, overthinking, very sensitive to perceived rejection, some depression, increase in financial stressors   Mental Status Exam:  Appearance:   Casual     Behavior:  Appropriate, Sharing, and Motivated  Motor:  Normal  Speech/Language:   Clear and Coherent  Affect:  Depressed and anxious  Mood:  anxious and depressed  Thought process:  goal directed  Thought content:    Rumination  Sensory/Perceptual disturbances:    WNL  Orientation:  oriented to person, place, time/date, situation, day of week, month of year, year, and stated date of Nov. 21, 2025  Attention:  Fair  Concentration:  Fair  Memory:  WNL  Fund of knowledge:   Good  Insight:    Good and Fair  Judgment:   Good  Impulse Control:  Good and Fair   Risk Assessment: Danger to Self:  No Self-injurious Behavior: No Danger to Others: No Duty to Warn:no Physical Aggression / Violence:No  Access to Firearms a concern: No  Gang Involvement:No   Subjective:   Patient working further today in reducing her anxiety, overthinking, relationship issues, making assumptions that may be inaccurate, frustration, and can be overly sensitive/defensive at times.  Perceived rejection can tip off several of these symptoms as discussed in session today. Feels this time of year is always bad due to my memories of a bad past, including her growing up being in and out of foster care homes. Doesn't struggle with this ongoing throughout the year and states it's mostly at holiday time.  Worked with patient more on this today, and how some of the events from her past has definitely impacted her in hurtful ways and let to certain coping skills that haven't been very helpful, and led to her often being judgmental and assuming things  that may not be accurate. Is concerned about her aging dog's health problems and talked further about this in session today as she has a lot of sadness and fears about her dog and hoping he can stabilize a while. Trying to work on some self-care, more positive self-talk, not assuming the worst. Also working to not be as over-sensitive and not jumping to negative conclusions. Did work more diligently in session today and commented on that with patient.   Interventions: Cognitive Behavioral Therapy, Solution-Oriented/Positive Psychology, and Ego-Supportive 1) Long Term Goal: Develop and follow through on strategies to reduce symptoms.     Short Term Goal: Reduce anxiety and improve coping skills.     Objective: Learn two new ways of coping with daily stressors.     Objective: Develop strategies for thought distraction when fixating on the future.     Objective: Manage panic episodes. 2) Long Term Goal: Maintain stability of mood     Short Term Goal: Increase ability to manage moods.     Objective: Discuss and resolve troubling personal and interpersonal issues.    Diagnosis:   ICD-10-CM   1. Bipolar 1 disorder, mixed, moderate (HCC)  F31.62      Plan:  Patient working in session today further on self-esteem, personal challenges related to trying to make healthier choices, and saying no to things that she knows are counterproductive and working on her goals. Much more open about certain hurtful issues/happenings in her past.  Continues working on trying not to  judge others in a negative way and to not hold onto negative conclusions versus considering some positives, and is making some progress with this more recently. Continue to encourage patient in increasing her motivation and sense of being engaged as she continues to work with goal-directed behaviors that are related to her being less negative, having more productive thought patterns, being more self understanding of herself at times,  practicing better limit setting with other people who are not healthy for her, refrain from jumping too quickly to conclusions about situations and understand that she may not have all the facts involved, recognize her overthinking more quickly and work to interrupt it, try reducing frequent thoughts of how things have to be in order to be okay, more regular practice of the Pause, work on healthier interaction skills including boundaries, let her faith be a resource of emotional support and stability as well as spiritual, recognize her strengths in addition to her challenges, understand that her anxious thoughts are just thoughts and are not necessarily going to play out in reality the way she might assume or fear.  Therapist continues to encourage patient in her work to be able to see her abilities and not just limitations, recognizing that some of her thoughts are not helpful for her as she is needing to move forward and replace those thoughts with more positive and hopeful thoughts which continues to be a challenge for patient.  Para Cossey is working on having more structure and follow-through and trying to stay committed to her treatment goals and notice improvement, while also noticing more of the positives than the negatives as she continues trying to move in a more hopeful and healthier direction into her future.  Goal review and progress/challenges noted with patient.  Next appointment within 2 weeks.   Barnie Bunde, LCSW

## 2024-05-21 ENCOUNTER — Ambulatory Visit

## 2024-05-21 ENCOUNTER — Other Ambulatory Visit: Payer: Self-pay | Admitting: Pulmonary Disease

## 2024-05-22 ENCOUNTER — Ambulatory Visit: Admitting: Psychiatry

## 2024-05-22 DIAGNOSIS — F3162 Bipolar disorder, current episode mixed, moderate: Secondary | ICD-10-CM

## 2024-05-22 NOTE — Progress Notes (Signed)
 [facility] Counselor/Therapist Progress Note  Patient ID: Brittany [name], MRN: [id number],    Date: 05/22/2024  Time Spent: 53 minutes   Treatment Type: Individual Therapy  Reported Symptoms:  Grief, bipolar, anxiety, depression, overthinking, very sensitive to perceived rejection   Mental Status Exam:  Appearance:   Casual     Behavior:  Sharing and not as motivated  Motor:  Normal  Speech/Language:   Clear and Coherent  Affect:  Anxious, some depression  Mood:  anxious and depressed  Thought process:  goal directed  Thought content:    Rumination  Sensory/Perceptual disturbances:    WNL  Orientation:  oriented to person, place, time/date, situation, day of week, month of year, year, and stated date of Dec. 5, 2025  Attention:  Fair  Concentration:  Good and Fair  Memory:  WNL  Fund of knowledge:   Good and Fair  Insight:    Good and Fair  Judgment:   Good and Fair  Impulse Control:  Fair   Risk Assessment: Danger to Self:  No Self-injurious Behavior: No Danger to Others: No Duty to Warn:no Physical Aggression / Violence:No  Access to Firearms a concern: No  Gang Involvement:No   Subjective:  Patient today wanting to focus particularly on the loss of her dog this past week. Patient was extremely close to her dog, and it was just him and patient living together at home, so this has been a difficult loss for her. Talking through this loss and expressing her sadness in session today. Looking at ways of better coping with her grief over her dog and this seemed helpful for patient. Denies any SI. Is beginning to gradually let the joy of having had her dog, outweigh the pain of having lost him.  Not easy, but is making progress, getting good support, and being more caring with herself as she works towards eventual healing from her loss. Worked on specific strategies she can use at home to help support her as she heals through her grief.     Interventions: Cognitive  Behavioral Therapy, Solution-Oriented/Positive Psychology, and Ego-Supportive 1) Long Term Goal: Develop and follow through on strategies to reduce symptoms.     Short Term Goal: Reduce anxiety and improve coping skills.     Objective: Learn two new ways of coping with daily stressors.     Objective: Develop strategies for thought distraction when fixating on the future.     Objective: Manage panic episodes. 2) Long Term Goal: Maintain stability of mood     Short Term Goal: Increase ability to manage moods.     Objective: Discuss and resolve troubling personal and interpersonal issues.     Diagnosis:   ICD-10-CM   1. Bipolar 1 disorder, mixed, moderate (HCC)  F31.62      Plan: Patient today needing most of session to share and process the grief of her long-time dog's death this past week.  She really needed appt today to  share her grief, be supported, and discuss strategies for continuing to move through her grief and gradually feel some healing and eventual moving forward. Therapist encouraging patient to keep working on her motivation and sense of being engaged as she works with goal-directed behaviors that are related to her being less negative, having more productive thought patterns, being more self understanding at times, practicing better limit setting with other people who are not healthy for her, refrain from jumping too quickly to conclusions about situations and understand that  she may not have all the facts involved, recognize her overthinking more quickly and work to interrupt it, try reducing frequent thoughts of how things have to be in order to be okay, more regular practice of the Pause, work on healthier interaction skills including boundaries, let her faith be a resource of emotional support and stability as well as spiritual, recognize the strengths and she has in addition to her challenges, understand that her anxious thoughts are just thoughts and are not necessarily  going to play out in reality the way she might assume or fear.  Therapist continues to emphasize some strengths that patient has rather than just focusing heavily on her limitations, recognizing that some of her thoughts are not helpful for her as she is needing to move forward and replace such thoughts with more positive and hopeful thoughts even though this is challenging for patient.  Brittany [name] continues to work on having more structure and follow-through and trying to stay committed to her treatment goals, noticing some improvement while also noticing some of the negatives as she continues to try to move in a more hopeful and healthier direction going forward.  Goal review and progress/challenges noted with patient.  Next appointment within 2 weeks.  Barnie Bunde, LCSW

## 2024-06-01 ENCOUNTER — Encounter: Payer: Self-pay | Admitting: Family Medicine

## 2024-06-01 ENCOUNTER — Encounter: Admitting: Family Medicine

## 2024-06-01 ENCOUNTER — Ambulatory Visit: Admitting: Family Medicine

## 2024-06-01 NOTE — Progress Notes (Signed)
 Established Patient Office Visit  Subjective   Patient ID: Brittany [name], female    DOB: 10/26/1955  Age: 68 y.o. MRN: [id number]  Chief Complaint  Patient presents with   Obesity    Patient requests medication to assist with losing weight    HPI Discussed the use of AI scribe software for clinical note transcription with the patient, who gave verbal consent to proceed.  History of Present Illness   Brittany [name] is a 68 year old female with coronary artery disease who presents for a follow-up visit to discuss weight loss.  She reports difficulty losing weight despite a Mediterranean low-carb diet and an exercise program adapted to recovery from knee replacement surgery five months ago. She feels her weight worsens her joint pain and mood.  Five months ago she had knee replacement surgery, which limited her ability to walk and exercise and reduced her overall physical activity.  Her diet emphasizes lean proteins and vegetables such as green beans and spinach. Many other vegetables cause stomach upset. She avoids dairy due to intolerance and uses more plant-based foods but notes many options are high in saturated fat and carbohydrates.  She recalls low iron levels after surgery, with last known hemoglobin 10.4 and ferritin 36, and feels she improved this with diet.  She has no diabetes and normal past A1c. A sleep study was recommended but not completed. She feels tired all the time, which she attributes more to mood than to sleep.  She has coronary artery disease and high cholesterol with a CT coronary calcium  score of 215. She takes Repatha  for genetically elevated cholesterol and is concerned about the effect of her weight on her cardiovascular health.  She is worried about the cost of weight-loss medications because her insurance does not cover them for weight loss, which may limit treatment options.       Current Outpatient Medications  Medication Instructions    acetaminophen  (TYLENOL ) 500-1,000 mg, Every 6 hours PRN   albuterol  (PROVENTIL ) 2.5 mg, Nebulization, Every 6 hours PRN   Albuterol  Sulfate (PROAIR  RESPICLICK) 108 (90 Base) MCG/ACT AEPB 2 puffs, Inhalation, Every 6 hours PRN   aluminum hydroxide-magnesium  carbonate (GAVISCON ) 95-358 MG/15ML SUSP 15-30 mLs, Oral, As needed   Ascorbic Acid  (VITA-C PO) 1 tablet, Daily   Azelastine  HCl 137 MCG/SPRAY SOLN SPRAY 1 SPRAY IN EACH NOSTRIL TWICE DAILY   busPIRone  (BUSPAR ) 15 mg, Oral, 3 times daily   cetirizine  (ZYRTEC ) 10 mg, Oral, Daily   Cholecalciferol (VITAMIN D -3 PO) 5,000 Units, Daily   clonazePAM  (KLONOPIN ) 0.5 MG tablet TAKE 1 TABLET BY MOUTH 2 TIMES A DAY AND TAKE 2 TABLETS BY MOUTH AT BEDTIME   dicyclomine  (BENTYL ) 10 mg, Oral, As needed   docusate sodium  (COLACE) 100 mg, Oral, 2 times daily   doxazosin  (CARDURA ) 8 mg, Oral, Daily at bedtime   ezetimibe  (ZETIA ) 10 mg, Oral, Daily   famotidine  (PEPCID ) 20 mg, Oral, 2 times daily   Ferrous Sulfate  (IRON PO) 1 tablet, Daily   fluticasone  (FLONASE ) 50 MCG/ACT nasal spray SPRAY 2 SPRAYS IN EACH NOSTRIL 2 TIMES A DAY   fluticasone -salmeterol (ADVAIR) 250-50 MCG/ACT AEPB 1 puff, Inhalation, Every 12 hours   ibuprofen (ADVIL) 200-400 mg, Every 8 hours PRN   MAGNESIUM  PO 1 tablet, Daily   methocarbamol  (ROBAXIN ) 500 mg, Oral, Every 8 hours PRN   mirtazapine  (REMERON ) 45 mg, Oral, Daily at bedtime   montelukast  (SINGULAIR ) 10 mg, Oral, Daily at bedtime   Multiple Vitamin (MULTIVITAMIN  WITH MINERALS) TABS tablet 1 tablet, Daily with breakfast   neomycin  (MYCIFRADIN ) 500 mg, Oral, 2 times daily   Omega-3 Fatty Acids (OMEGA 3 PO) 1 capsule, Daily   pantoprazole  (PROTONIX ) 40 mg, Oral, 2 times daily, Take 30 min to 1 hr before morning coffee and dinner.   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   Healthy adult on routine physical examination 04/25/2022   Asthma exacerbation 04/09/2022   Small intestinal bacterial overgrowth (SIBO) 04/09/2022   Fatigue 01/08/2022   Morbid obesity (HCC) 01/08/2022   Pulmonary fibrosis (HCC) 04/06/2020   Dizziness 02/11/2020   Melena 04/22/2019   Suprapubic abdominal pain 04/22/2019   Osteopenia 12/04/2016   Chronic post-traumatic stress disorder (PTSD) 08/23/2016   Bipolar affective disorder, current episode depressed (HCC) 08/23/2016   Hyperlipidemia 08/23/2016   COPD / AB vs ACOS 06/22/2016   Cough variant asthma  vs UACS  06/21/2016   GERD 04/03/2007     Review of Systems  All other systems reviewed and are negative.     Objective:     BP 100/64   Pulse 80   Temp 98.2 F (36.8 C) (Oral)   Ht 5' 1 (1.549 m)   Wt 188 lb 3.2 oz (85.4 kg)   SpO2 98%   BMI 35.56 kg/m    Physical Exam Vitals reviewed.  Constitutional:      Appearance: Normal appearance. She is well-groomed. She is obese.  Cardiovascular:     Rate and Rhythm: Normal rate and regular rhythm.     Heart sounds: S1 normal and S2 normal.  Pulmonary:     Effort: Pulmonary effort is normal.     Breath sounds: Normal air entry.  Neurological:     Mental Status: She is alert and oriented to person, place, and time. Mental status is at baseline.     Gait: Gait is intact.  Psychiatric:        Mood and Affect: Mood and affect normal.        Speech: Speech normal.        Behavior: Behavior normal.      No results found for any visits on 06/01/24.    The 10-year ASCVD risk score (Arnett DK, et al., 2019) is: 3.9%     Assessment & Plan:  Morbid obesity (HCC)  Assessment and Plan    Morbid obesity Challenges in weight loss due to knee replacement surgery and dietary restrictions. Current low-carbohydrate diet and exercise regimen are insufficient. Medications like Wegovy and Zepbound are not covered by Medicare for weight loss. Coronary calcium  score does not qualify for [facility] coverage. Obstructive sleep apnea diagnosis could potentially justify Zepbound coverage. Muscle building exercises may aid metabolism and weight loss. - Consider muscle building exercises to increase metabolism. - Discuss with pulmonologist about completing a sleep study to evaluate for obstructive sleep apnea. - If obstructive sleep apnea is diagnosed, will consider Zepbound for weight loss.  Atherosclerotic heart disease of  native coronary artery Coronary artery disease with a CT calcium  score of 215, higher than age-matched peers. No prior myocardial infarction, cerebrovascular accident, or peripheral arterial disease. Current management includes Repatha  for hyperlipidemia. - Continue Repatha  for hyperlipidemia management. - Will schedule coronary artery scan in February.  Hyperlipidemia Managed with Repatha  due to genetic predisposition (LPA gene). Previous lifestyle modifications were insufficient to control cholesterol levels. - Continue Repatha  for hyperlipidemia management.  General health maintenance Annual wellness visit was scheduled but not completed. Rescheduling is necessary. - Reschedule annual wellness visit as a phone call.     I spent 20 minutes with the patient today discussing weight loss medications, diet, strategies, etc.    Return for reschedule AWV, ok to be video/phone call.    Heron [name] Sharper, MD

## 2024-06-03 ENCOUNTER — Ambulatory Visit: Admitting: Psychiatry

## 2024-06-03 ENCOUNTER — Encounter: Payer: Self-pay | Admitting: Psychiatry

## 2024-06-03 DIAGNOSIS — F4001 Agoraphobia with panic disorder: Secondary | ICD-10-CM

## 2024-06-03 DIAGNOSIS — F3162 Bipolar disorder, current episode mixed, moderate: Secondary | ICD-10-CM | POA: Diagnosis not present

## 2024-06-03 DIAGNOSIS — F4312 Post-traumatic stress disorder, chronic: Secondary | ICD-10-CM

## 2024-06-03 DIAGNOSIS — F39 Unspecified mood [affective] disorder: Secondary | ICD-10-CM

## 2024-06-03 DIAGNOSIS — F411 Generalized anxiety disorder: Secondary | ICD-10-CM

## 2024-06-03 DIAGNOSIS — F5105 Insomnia due to other mental disorder: Secondary | ICD-10-CM | POA: Diagnosis not present

## 2024-06-03 DIAGNOSIS — F4310 Post-traumatic stress disorder, unspecified: Secondary | ICD-10-CM | POA: Diagnosis not present

## 2024-06-03 DIAGNOSIS — F515 Nightmare disorder: Secondary | ICD-10-CM | POA: Diagnosis not present

## 2024-06-03 MED ORDER — AUVELITY 45-105 MG PO TBCR: 1.0000 | EXTENDED_RELEASE_TABLET | Freq: Two times a day (BID) | ORAL | 0 refills | Status: AC

## 2024-06-03 NOTE — Progress Notes (Signed)
 LANITRA BATTAGLINI [id number] [birth year] 68 y.o.     Subjective:   Patient ID:  Brittany [name] is a 68 y.o. (DOB [birth year]) female.  Chief Complaint:  Chief Complaint  Patient presents with   Follow-up   Depression   Anxiety   Sleeping Problem    Brittany [name] presents to the office today for follow-up of long-term depression and anxiety and insomnia.    At visit September 08, 2018 at which time the patient was weaned off of gabapentin  and started on Trileptal  up to twice daily.  She has been very med sensitive and difficult to treat because of that.  When seen September 19, 2018.  No meds were changed.  She stopped oxcarbazapine on her own DT itching.  seen July 2020.  She did not want to try any additional medications despite ongoing depression.  She wanted to stop lithium  despite risk of worsening depression.  She was given instructions about how to do it in the safest manner possible.  Last seen August 12, 2019..   Still taking diazepam  5 TID, mirtazapine  15, doxazosin  increased to 8 mg nightly for nightmares from 6 mg.    10/16/2019 appointment, the following noted: NM much better with increase prazosin to 8 mg HS and anxiety better that was related and tolerated.  Overall anxiety still a problem.  depression and anxiety much worse for a week after 2nd Covid vaccine recently.   Also overall more anxious and agoraphobia is bad.  Does force herself to go to [employer] weekly.   Averaging diazepam  5 mg BID-TID daily.  No drowsiness.  Avoids more at night bc of worsening NM from it. Weird dreams and occ awakens with panic.  Lost another fur baby that's 2 in 1 year early 2021.  $ and health stress.  Dreams are worse than last visit.  No SE.  Mirtazapine  helps sleep generally and much better with it than without it.  Post Covid November 2020. Got significantly better in depression since here.  Prayed a lot about it and thought the lithium  was helping.  Feels better now off it.  Was going  through a lot at the time.  But after a month more peace and joy.   Buspirone  helped.  Diazepam  better than Xanax  which she feels triggered dissociation and irritabilty.  Lost her dog which is hard.  Prayed and that helped.  Oldest GS joined special forces and gave her his car.  Anxiety driving but does it.   Plan: no med changes  02/09/20 appt with the following noted: Went back down on doxazosin  to 4 mg bc of dizziness but it's not better.  She plans to talk to her other doctors about it.. CO anxiety and wanting to take more meds for it.  Anxiety without reason generally. Increased Diazepam  from 5 mg TID to QID end May 2021.   Worsening mirtazapine  also.  Has done some night eating without memory the next day. Doesn't seem to be anxiety causing insomnia.  Plan: Start topiramate  off label for anxiety and weight loss at 25 mg daily and increase gradually to 100 mg daily.  06/21/2020 appointment with the following noted: Had problems with night terrors again with stress around $ and pet sick.  Getting better again.   Still taking topiramate  100 mg started last visit. Lost weight DT stomach problems.  Doctors cannot explain.  Feels like poor absorption of nutrients.  Difficulty tolerating foods.  Dx bacterial overgrowth in small intestine but corrected.  Stomach constantly burns.  Sx were present before topiramate ? Anxiety is OK if stays home but is worse in public.  Meds are helping anxiety.  Not markedly depressed.  Meds are helping.  11/30/20 appt noted: Chronic anxiety averaging ? Amount daily of diazepam .  Anxiety is higher out of the house.  Gets out of the house regularly to help care for 7 mos old GS.  D took her to beach last week.   Has 5 gallon fish tank with some guppies.   Occ night terrrors. Occ EMA Taking doxazosin  6 mg HS, 4 mg was less effective. Generally more trouble with anxiety than depres but some depression occ with $ stress and despise where she lives.   Ketogenic diet  helped GI usually but some pain today.  Haunted with medical bills. Had Covid hosp 11 days and then home with O2 for a month.  Pulm said don't get Covid again.  Keeps her from going to church regularly but that would help. Plan: Also topiramate  Continue buspirone  15 mg 3 times daily,  and mirtazapine  30 mg nightly for sleep.  Has been compliant with Valium .  She asks about an increase to increase.  OK continue Valium  5 mg  QID prn but take LED. Cont buspirone  15 mg TID, doxazosin  6-8 mg HS  03/29/2021 appointment with the following noted: Has aquarium. Doing OK except some seasonal depression and irritability noticed. Increased doxazosin  between 6-8 mg HS.  Still having weird dreams but not having NM or night terrrors usually. No med changes  09/27/2021 appointment with the following noted: A lot better than normal Oct=Feb.  New GS, D married and some things to look forward to but still some anniversaries of losses.  Helping D with 82 mo old GS helps her a lot.  But did gain weight over the winter.  Dx CAD and put on Statin with cog and depressive and anxiety SE.  Current one not as bad but a little more anxiety and using a little more diazepam .  Nervous about people working in her appt. Going to [facility] on plane for first time with her family.  Excited but nervous too.   Plan no med changes  01/25/2022 appointment noted: No concerns about psych meds except Chronic $ stress.  Has to make choices about essentials.  I put myself into dept living on credit cards. Will go to sleep but EMA 40% of time despite mirtazapine .  Not usually a napper but on occ.    Can't tolerate statins. Even with CoQ10. Chronic med sensitivity.  Brought Genesight test. Got SI about 2-4 weeks into statin and better off it after 3-4 weeks off it. Minimal depression at this time.  Some chronic anxiety which is worse than depression. Disc problematic night eating.  07/02/2022 phone call complaining of high depression  and anxiety.  Appointment was moved up.  07/03/22 appointment noted: Current psych meds diazepam  5 mg every 6 hours as needed, mirtazapine  45 mg nightly, buspirone  15 mg 3 times daily, doxazosin  4 to 8 mg nightly prn. Thinks mood got worse when on statins.  Stopped it in summer.  Then took red yeast rice and had a little with it too.   Gained wt in summer from foot surgery.  $ stress from living on credit cards. Complaining of much worse depression and anxiety.  Increased SI persistent like 10-15 years ago. Don't like crowds.  Frustrated and irritable. Thoutghts jumbled. Was at diazepam  5 mg 3 daily and now 4  daily.  At times has taken 10 mg at a time. Plans to restart therapy.  Wants Christian therapist. Chronically fearful of psych meds and doesn't want to start something extra. Still a lot of GI problems. Plan: DC mirtazapine . Caplyta 42 mg HS trial  07/31/22 TC stopped Caplyta and doesn't want to try another med.  Complained of dizziness and being cold with it.  Didn't make her sleepy.    09/04/22 appt noted: Resumed mirtazapine  45 HS, diazepam  5 , buspirone  15 TID, doxazosine 4-8 mg HS. NM occ but less severe.   Still has some EMA.  She doesn't want to change doses. Seeing therapist has helped her cope and mood and anxiety is better.  She doesn't feel she needs more meds or other meds.  No severe mood swings.  Not severely depressed.  Anxiety manageable usually.   Was more irritable when visited D and 2yo grandson.  Did miss meds more while there. No SE with current meds. Insurance will cover 100 days of meds. I think I have a control problem.   Plan no changes  01/01/23 appt noted: Meds as above:  buspirone  15 TID, diazepam  5-10 mg TID prn, doxazosin  4-8 mg HS, mirtazapine  45 HS. Anxiety worse in the am.  Has increased diazepam  use and feels it is less effective.  More $ strain DT inflation & credit card debt and trying to get out of the problem. Lost 30#.  Disc wt loss and temp  sensitivity.  Stopped bread and sugar and dairy.   Tries to walk her dog daily.   Working on heart health bc genetics.   Night terrors stopped and reduced doxazosin  but didn't slep as well.  So went back up and didn't solve the problems.   More EMA than before.  Sometimes naps an hour.   Mood rollercoaster.  This weekend dep with SI without reason. New cholesterol med; not a statin.  HX statins with strong SI. Mind constantly racing on random things with some worry related to health and $. At times dep worse than anxiety.   Thinks mirtazapine  may help her fall asleep faster.  Doesn't sleep any better with less than 45 mg mirtazapine .   Plan: Continue buspirone  15 mg 3 times daily,  mirtazapine  per her request at 22.5-45 mg HS.  SABRA Switch clonazepam  0.5 mg BID & 1mg  hS Continue Vitamin D  Doxazosin  8 mg HS  03/05/23 appt noted: Re: switch from diazepam  to clonazepam  I wish you had switched me years ago. Slowed down racing thoughts.  Sleep is a little better.  Not as anxious when awaken at night.  Usually anxiety is worse in the am and not as bad.  Response to clonazepam  is smoother.  Not as sensitive to a missed dose.  Family under a lot of stress right now.  D dx with aggressive breast Ca.  D married with a 31 yo son.  D more anxious and dep and pending surgery and chemo.  Pending double mastectomy and radiation.  She is first person pt knows personally with this.   Plan no changes  06/14/23 appt noted: Still in therapy with Marval Bunde LCSW. Psych meds: as above D battling breast CA. Pretty good considering re: mental health.  Increased vitamin D  for the winter. Low Motivation and impulsive eating.  Fought so hard to lose wt and realized genetics for high cholesterol.  Working to change mind set.   Better focus and less mind racing with clonazepam .  Occ night without  sleep.  Last night EMA 2 AM and back to sleep at 6.  Now about once every couple weeks with clonazepam .   Doxazosin   stopped NM and night terrors.  Only a couple in last 3 mos. Nilsa is oldest D's 79 th birthday; she is the one who only lived 2 days.  Yesterday was a bad day.   Hasn't read much in years.  But plans to get back into it.   Wonders about hypnosis for eating problems and impulse control with spending.   Has made progress on latter. No mania.  Plan: Continue buspirone  15 mg 3 times daily,  mirtazapine  per her request at 22.5-45 mg HS.  SABRA Better with Switch clonazepam  0.5 mg BID & 1mg  hS vs diazepam . Continue Vitamin D  Continue doxazosin  8 mg HS for NM  10/15/23 appt note: Med as above. Not sad nor dep but not much motivated to do chores.  Off and on for years but steady for mos.  Seems to get worse.   Out of work since 2010.   Anxiety managed pretty well. Not sleepy with clonazepam .   Sleep pretty good.  NM none for awhile until recent when woke in panic.  Sometimes triggered by TV program or her sister.  A whole lot better. Rare daytime panic triggered.  12/17/23 appt note: Med:  buspirone  15 TID, clonazepam  0.25 mg BID and 1 mg HS, doxazosin  4-8 mg HS, mirtazapine  45 HS.  Wellbutrin  XL 150 AM Took Wellbutrin  300 for a couple of weeks but SE excessive sweating.  Also didn't notice benefit. Reduced to 150 mg daily until now.  No change with it.   Sweating is better with less but not gone.  Not real down but not very motivated nor productivity.   Plays Bingo weekly and coffee time gathering where she lives weekly.  Trouble conc on phone games.   Asthma flare with heat.  Chronic GI issues.   TKR pending next week.  No current pain meds.  Disc pending PT.   Would like help with conc, inattention, low interest.   Anxiety is a lot better with clonazepam .  And sleep better Sleep variable with a little longer to sleep than with diazepam .  Rare EMA  03/10/24 appt noted: Med; buspirone  15 TID, clonazepam  0.25 mg BID and 1 mg HS, doxazosin  4-8 mg HS, mirtazapine  45 HS.  Wellbutrin  XL 150 AM TKR  going ok.  Been so irritable and emotionally sensitive since surgery.   So exhausted all the time.  But a lot going on in the family too. D declared CA free.  Her F CA stomach.   Still care about him.   I'm very empathetic and take on emotions of others.  Working on this with therapist Marval Bunde.   Some inattention issues.  All over the place now.  Her furbaby injured leg and vet bills.  Can't walk with him.   Trying to move around and doing things for about 15-30 min at time. Not sleeping good bc hard to turn her brain off.  No full NM but some strange dreams that are stressful.  Sometimes of people that have traumatized me.   Used nebulizer this morning makes her shakey. $ stress and shops at food banks.  Would like more energy and better focus. Has 68 yo GS.   Plan: No med changes are obvious  06/03/24 appt noted:  Med:  buspirone  15 TID, clonazepam  0.25 mg BID and 1 mg HS, doxazosin  4-8 mg  HS, mirtazapine  45 HS.  Stopped Wellbutrin  XL 150 AM Gets nervous about running out of BZ and cut back clonazepam  but feeling more anxious about it and want sto go back up.  This is not a good time of year.  Kids at odds  with each other now.  Son  Feels more dep and hard to get OOB I the AM.  All of it crashing in on her.  Losing her dog.  Trying to have positive people in her inner circle.   Continues therapy with D Dowd. Recovering from knee surgery.   Worries about weight gain with meds generally fearful of meds.  Chronic $ problems.  Stopped therapy about September 2020. Out of work since 2010.    Multiple med failures.  Med sensitive & cost limit options. restoril, diazepam  is ok. NO MORE Xanax  DT dissociation and irritability,   Trazodone NR, Ambien, hyrdroxyzine, prazosin, doxazosin ,  propranolol,  mirtazapine  45 sertraline, fluoxetine NR, Lexapro,   Buspar  15 TID, gabapentin , Wellbutrin  25 y ago helped stop smoking.  Retry SE sweating at 300.   Latuda, Olanzapine, risperidone with  AE, Seroquel SE, Geodon, Saphris,  Abilify, Rexulti seemed to help for short while but $, Caplyta 42 dizzy  Trileptal  itching, lithium  NR, CBZ SE, Lamotrigine, VPA,  History statins strong SI  Son started therapy for anxiety.  M also had psych px.    Review of Systems:  Review of Systems  Constitutional:  Positive for fatigue.  HENT:  Negative for congestion.   Respiratory:  Positive for shortness of breath and wheezing.   Gastrointestinal:  Negative for abdominal distention.  Musculoskeletal:  Positive for arthralgias and back pain.  Neurological:  Negative for dizziness, tremors and light-headedness.  Psychiatric/Behavioral:  Positive for sleep disturbance. Negative for agitation, behavioral problems, confusion, decreased concentration, dysphoric mood, hallucinations, self-injury and suicidal ideas. The patient is nervous/anxious. The patient is not hyperactive.        Please refer to HPI  Stress eating.  Medications: I have reviewed the patient's current medications.  Current Outpatient Medications  Medication Sig Dispense Refill   acetaminophen  (TYLENOL ) 500 MG tablet Take 500-1,000 mg by mouth every 6 (six) hours as needed (pain.).     albuterol  (PROVENTIL ) (2.5 MG/3ML) 0.083% nebulizer solution Take 3 mLs (2.5 mg total) by nebulization every 6 (six) hours as needed for wheezing or shortness of breath. 75 mL 12   Albuterol  Sulfate (PROAIR  RESPICLICK) 108 (90 Base) MCG/ACT AEPB Inhale 2 puffs into the lungs every 6 (six) hours as needed. 1 each 5   aluminum hydroxide-magnesium  carbonate (GAVISCON ) 95-358 MG/15ML SUSP Take 15-30 mLs by mouth as needed. 355 mL 0   Ascorbic Acid  (VITA-C PO) Take 1 tablet by mouth daily.     Azelastine  HCl 137 MCG/SPRAY SOLN SPRAY 1 SPRAY IN EACH NOSTRIL TWICE DAILY 30 mL 5   busPIRone  (BUSPAR ) 15 MG tablet Take 1 tablet (15 mg total) by mouth 3 (three) times daily. 270 tablet 1   cetirizine  (ZYRTEC ) 10 MG tablet Take 1 tablet (10 mg total) by mouth  daily. 90 tablet 1   Cholecalciferol (VITAMIN D -3 PO) Take 5,000 Units by mouth daily.     clonazePAM  (KLONOPIN ) 0.5 MG tablet TAKE 1 TABLET BY MOUTH 2 TIMES A DAY AND TAKE 2 TABLETS BY MOUTH AT BEDTIME (Patient taking differently: 1 in the Am and 1/2  in the afternoon and 1 and 1/2 tablets at night) 400 tablet 0   Dextromethorphan -buPROPion  ER (AUVELITY ) 45-105 MG TBCR  Take 1 tablet by mouth 2 (two) times daily. 180 tablet 0   dicyclomine  (BENTYL ) 10 MG capsule Take 1 capsule (10 mg total) by mouth as needed for spasms. 90 capsule 2   docusate sodium  (COLACE) 100 MG capsule Take 1 capsule (100 mg total) by mouth 2 (two) times daily. 60 capsule 2   doxazosin  (CARDURA ) 4 MG tablet Take 2 tablets (8 mg total) by mouth at bedtime. 180 tablet 0   Evolocumab  (REPATHA  SURECLICK) 140 MG/ML SOAJ Inject 140 mg into the skin every 14 (fourteen) days. 6 mL 3   ezetimibe  (ZETIA ) 10 MG tablet Take 1 tablet (10 mg total) by mouth daily. 90 tablet 3   famotidine  (PEPCID ) 20 MG tablet Take 1 tablet (20 mg total) by mouth 2 (two) times daily. 60 tablet 0   Ferrous Sulfate  (IRON PO) Take 1 tablet by mouth daily.     fluticasone  (FLONASE ) 50 MCG/ACT nasal spray SPRAY 2 SPRAYS IN EACH NOSTRIL 2 TIMES A DAY 96 mL 2   fluticasone -salmeterol (ADVAIR) 250-50 MCG/ACT AEPB Inhale 1 puff into the lungs every 12 (twelve) hours. 180 each 3   ibuprofen (ADVIL) 200 MG tablet Take 200-400 mg by mouth every 8 (eight) hours as needed (pain.).     MAGNESIUM  PO Take 1 tablet by mouth daily.     methocarbamol  (ROBAXIN ) 500 MG tablet Take 1 tablet (500 mg total) by mouth every 8 (eight) hours as needed for muscle spasms. 30 tablet 1   mirtazapine  (REMERON ) 45 MG tablet Take 1 tablet (45 mg total) by mouth at bedtime. 90 tablet 1   montelukast  (SINGULAIR ) 10 MG tablet TAKE 1 TABLET BY MOUTH AT BEDTIME 100 tablet 0   Multiple Vitamin (MULTIVITAMIN WITH MINERALS) TABS tablet Take 1 tablet by mouth daily with breakfast.     neomycin   (MYCIFRADIN ) 500 MG tablet Take 1 tablet (500 mg total) by mouth 2 (two) times daily. 20 tablet 0   Omega-3 Fatty Acids (OMEGA 3 PO) Take 1 capsule by mouth daily.     pantoprazole  (PROTONIX ) 40 MG tablet Take 1 tablet (40 mg total) by mouth 2 (two) times daily. Take 30 min to 1 hr before morning coffee and dinner. 90 tablet 3   polyethylene glycol (MIRALAX  / GLYCOLAX ) 17 g packet Take 17 g by mouth daily. 14 each 0   Scar Treatment Products (SCAR EX) Apply 1 Application topically as needed (bruises).     sodium chloride  (OCEAN) 0.65 % SOLN nasal spray Place 1 spray into both nostrils in the morning and at bedtime.     Vitamin D -Vitamin K (D3 + K2 PO) Take 1 tablet by mouth daily.     No current facility-administered medications for this visit.    Medication Side Effects: None  Allergies:  Allergies  Allergen Reactions   Aspirin  Other (See Comments)    discomfort   Atorvastatin  Other (See Comments)    Patient states she felt suicidal   Codeine Itching   Crestor  [Rosuvastatin ] Other (See Comments)    Dizzy, leg pain and states she felt suicidal   Gabapentin  Other (See Comments)   Niacin And Related Itching   Sulfa Antibiotics Other (See Comments)    Other reaction(s): Unknown   Sulfamethoxazole-Trimethoprim Other (See Comments)    Unknown reaction per pt   Topiramate  Other (See Comments)    Per patient, it causes stomach burning   Trileptal  [Oxcarbazepine ] Itching    itching   Trimethoprim Other (See Comments)    Unknown, per  pt   Ultram [Tramadol] Itching   Vioxx [Rofecoxib] Other (See Comments)    Unknown per pt    Past Medical History:  Diagnosis Date   Abnormal EKG    Allergy    Anxiety    Arthritis    Asthma    Bipolar depression (HCC), Anxiety, PTSD, Panic disorder    -managed by [facility]   CAD (coronary artery disease)    COPD (chronic obstructive pulmonary disease) (HCC)    Depression    Foot pain    Gallstones    GERD (gastroesophageal reflux  disease)    Heart murmur    as a child   HLD (hyperlipidemia)    Insomnia    Leg swelling    Obesity    Osteoporosis    osteopenia   Pneumonia due to COVID-19 virus 04/22/2019   Tachycardia     Family History  Problem Relation Age of Onset   Hypertension Mother    Hyperlipidemia Mother    Congestive Heart Failure Mother    Other Father        killed   COPD Brother    Heart disease Brother    Pulmonary embolism Brother    Colon polyps Maternal Aunt    COPD Paternal Aunt        a lot of aunts and uncle COPD or Emphysema   Emphysema Paternal Uncle    Breast cancer Daughter    Colon cancer Neg Hx    Esophageal cancer Neg Hx    Pancreatic cancer Neg Hx    Stomach cancer Neg Hx    Liver disease Neg Hx    Rectal cancer Neg Hx     Social History   Socioeconomic History   Marital status: Single    Spouse name: Not on file   Number of children: 3   Years of education: Not on file   Highest education level: Some college, no degree  Occupational History   Occupation: disabled  Tobacco Use   Smoking status: Former    Current packs/day: 0.00    Average packs/day: 20.0 packs/day for 1 year (20.0 ttl pk-yrs)    Types: Cigarettes    Start date: 06/18/1998    Quit date: 06/19/1999    Years since quitting: 24.9   Smokeless tobacco: Never   Tobacco comments:    20 year estimate but probably less   Vaping Use   Vaping status: Never Used  Substance and Sexual Activity   Alcohol use: Yes    Comment: Rare   Drug use: No   Sexual activity: Never  Other Topics Concern   Not on file  Social History Narrative   Work or School: Disabled seconndary to psychiatric conditions      Home Situation: lives alone with 2 cats and one dog      Spiritual Beliefs: Christian      Lifestyle: no regular exercise, diet  Not great - wants to embark on healthier lifestyle   Social Drivers of Health   Tobacco Use: Medium Risk (06/03/2024)   Patient History    Smoking Tobacco Use: Former     Smokeless Tobacco Use: Never    Passive Exposure: Not on file  Financial Resource Strain: High Risk (05/29/2024)   Overall Financial Resource Strain (CARDIA)    Difficulty of Paying Living Expenses: Hard  Food Insecurity: Food Insecurity Present (05/29/2024)   Epic    Worried About Radiation Protection Practitioner of Food in the Last Year: Sometimes true  Ran Out of Food in the Last Year: Sometimes true  Transportation Needs: Unknown (05/29/2024)   Epic    Lack of Transportation (Medical): No    Lack of Transportation (Non-Medical): Patient declined  Physical Activity: Unknown (05/29/2024)   Exercise Vital Sign    Days of Exercise per Week: Patient declined    Minutes of Exercise per Session: Not on file  Stress: Stress Concern Present (05/29/2024)   [employer] of Occupational Health - Occupational Stress Questionnaire    Feeling of Stress: To some extent  Social Connections: Moderately Isolated (05/29/2024)   Social Connection and Isolation Panel    Frequency of Communication with Friends and Family: Twice a week    Frequency of Social Gatherings with Friends and Family: Once a week    Attends Religious Services: More than 4 times per year    Active Member of [employer] or Organizations: No    Attends Banker Meetings: Not on file    Marital Status: Divorced  Intimate Partner Violence: Not At Risk (12/25/2023)   Epic    Fear of Current or Ex-Partner: No    Emotionally Abused: No    Physically Abused: No    Sexually Abused: No  Depression (PHQ2-9): Medium Risk (12/02/2023)   Depression (PHQ2-9)    PHQ-2 Score: 10  Alcohol Screen: Low Risk (05/29/2024)   Alcohol Screen    Last Alcohol Screening Score (AUDIT): 1  Housing: Low Risk (05/29/2024)   Epic    Unable to Pay for Housing in the Last Year: No    Number of Times Moved in the Last Year: 0    Homeless in the Last Year: No  Utilities: Not At Risk (12/25/2023)   Epic    Threatened with loss of utilities: No  Health Literacy:  Inadequate Health Literacy (12/02/2023)   B1300 Health Literacy    Frequency of need for help with medical instructions: Sometimes    Past Medical History, Surgical history, Social history, and Family history were reviewed and updated as appropriate.   Divorced twice.  Please see review of systems for further details on the patient's review from today.   Objective:   Physical Exam:  There were no vitals taken for this visit.  Physical Exam Constitutional:      General: She is not in acute distress. Musculoskeletal:        General: No deformity.  Neurological:     Mental Status: She is alert and oriented to person, place, and time.     Cranial Nerves: No dysarthria.     Coordination: Coordination normal.  Psychiatric:        Attention and Perception: Attention and perception normal. She does not perceive auditory or visual hallucinations.        Mood and Affect: Mood is anxious and depressed. Affect is not labile or inappropriate.        Speech: Speech normal. Speech is not rapid and pressured or slurred.        Behavior: Behavior normal. Behavior is cooperative.        Thought Content: Thought content normal. Thought content is not paranoid or delusional. Thought content does not include homicidal or suicidal ideation. Thought content does not include suicidal plan.        Cognition and Memory: Cognition and memory normal.        Judgment: Judgment normal.     Comments: Insight and judgment fair.  not overtly manic.  chronic $ stress Dep worse with some situational  Talkative Racing thoughts mainly at night.     Lab Review:     Component Value Date/Time   NA 138 12/28/2023 0353   NA 141 11/17/2021 0835   K 3.6 12/28/2023 0353   CL 98 12/28/2023 0353   CO2 33 (H) 12/28/2023 0353   GLUCOSE 128 (H) 12/28/2023 0353   BUN 11 12/28/2023 0353   BUN 16 11/17/2021 0835   CREATININE 0.57 12/28/2023 0353   CREATININE 0.94 04/06/2020 0916   CALCIUM  8.4 (L) 12/28/2023 0353    PROT 6.0 (L) 12/28/2023 0353   PROT 6.3 05/22/2023 1044   ALBUMIN 3.1 (L) 12/28/2023 0353   ALBUMIN 4.4 05/22/2023 1044   AST 12 (L) 12/28/2023 0353   ALT 10 12/28/2023 0353   ALKPHOS 43 12/28/2023 0353   BILITOT 0.5 12/28/2023 0353   BILITOT 0.3 05/22/2023 1044   GFRNONAA >60 12/28/2023 0353   GFRAA >60 05/01/2019 0155       Component Value Date/Time   WBC 5.7 12/28/2023 0353   RBC 3.39 (L) 12/28/2023 0353   HGB 10.4 (L) 12/28/2023 0353   HCT 31.6 (L) 12/28/2023 0353   PLT 234 12/28/2023 0353   MCV 93.2 12/28/2023 0353   MCH 30.7 12/28/2023 0353   MCHC 32.9 12/28/2023 0353   RDW 13.2 12/28/2023 0353   LYMPHSABS 0.8 12/26/2023 0941   MONOABS 0.9 12/26/2023 0941   EOSABS 0.0 12/26/2023 0941   BASOSABS 0.0 12/26/2023 0941    No results found for: POCLITH, LITHIUM    Lab Results  Component Value Date   VALPROATE 25.3 (L) 10/26/2008     .res Assessment: Plan:    Roena was seen today for follow-up, depression, anxiety and sleeping problem.  Diagnoses and all orders for this visit:  Bipolar 1 disorder, mixed, moderate (HCC) -     Dextromethorphan -buPROPion  ER (AUVELITY ) 45-105 MG TBCR; Take 1 tablet by mouth 2 (two) times daily.  PTSD (post-traumatic stress disorder)  Panic disorder with agoraphobia  Insomnia due to mental condition  Episodic mood disorder  Nightmares associated with chronic post-traumatic stress disorder  Anxiety state   Chronic noncompliance complicates treatment but bettter lately Likey borderline personality disorder vs PTSD complication.  30 min face to face time with patient. We discussed her chronic sx of depression, anxiety and insomnia combined with medication sensitivity which makes it very difficult to treat her.  Multiple med failures.  Limited insight into bipolar vs PD and has refused mood stabilizers.  This was discussed with her repeatedly. But she has not had manic episodes off mood stabilizers. Seems unusual that she's  not having manic episodes apparently without mood stabilizer.  ? Racing thought related but not real prominent euphoria or irritability.  Cycles of dep.    May change dx back to major depression. Her faith helps. Still not reporting manic phases.    She has failed to respond to multiple SSRIs and other psychiatric medications.    Out of work since 2010.    Option clonidine  We discussed the short-term risks associated with benzodiazepines including sedation and increased fall risk among others.  She reports better anxiety control with diazepam  as opposed to Xanax  intially until now.   She does not want to be prescribed Xanax  at any point in the future bc history of dissociation with it.   Discussed long-term side effect risk including dependence, potential withdrawal symptoms, and the potential eventual dose-related risk of dementia but 2020 studies  Advantage of clonazepam  which probably does have some mild mood  stabilizing effects.   For energy motivation later option trial modafinil or Fetzima for focus and motivation.   Anxiety managed.  Option Viibryd for anxiety.  T4 tier and she doesn't think she can afford.  Only options are generic.  Encourage reasonable exercise to help improve her energy and cognition.  Maximal activity would help mental health.   Reviewed [facility] results in detail previously.  Continue buspirone  15 mg 3 times daily,   mirtazapine  per her request at 22.5-45 mg HS.  .  clonazepam  0.5 mg BID & 1mg  hS   Continue Vitamin D   Continue doxazosin  4-8 mg HS for NM  Bc med tolerance trial Auvelity  1 HS.  Is med sensitive.  May need to increase.  For depression  FU 2 mos  Lorene Macintosh, MD, DFAPA   Please see After Visit Summary for patient specific instructions.  Future Appointments  Date Time Provider Department Center  06/05/2024 10:00 AM Sherlynn Sober, LCSW [facility] None  06/12/2024 10:00 AM Ozell Heron HERO, MD [facility] [address]  06/19/2024 10:00 AM  Sherlynn Sober, LCSW [facility] None  07/03/2024 10:00 AM Sherlynn Sober, LCSW [facility] None  12/01/2024  8:00 AM Ozell Heron HERO, MD [facility] [address]    No orders of the defined types were placed in this encounter.     -------------------------------

## 2024-06-05 ENCOUNTER — Ambulatory Visit: Admitting: Psychiatry

## 2024-06-05 DIAGNOSIS — F3162 Bipolar disorder, current episode mixed, moderate: Secondary | ICD-10-CM | POA: Diagnosis not present

## 2024-06-05 NOTE — Progress Notes (Signed)
 "       [facility] Counselor/Therapist Progress Note  Patient ID: EUFELIA VENO, MRN: [id number],    Date: 06/05/2024  Time Spent: 53 minutes   Treatment Type: Individual Therapy  Reported Symptoms:   Grief, bipolar, anxiety, depression some worse and trying new med., overthinking, very sensitive to perceived rejection   Mental Status Exam:  Appearance:   Casual     Behavior:  Appropriate, Sharing, and Motivated  Motor:  Normal  Speech/Language:   Clear and Coherent  Affect:  Depressed and anxious  Mood:  anxious and depressed  Thought process:  goal directed  Thought content:    WNL  Sensory/Perceptual disturbances:    WNL  Orientation:  oriented to person, place, time/date, situation, day of week, month of year, year, and stated date of Dec. 19, 2025  Attention:  Good  Concentration:  Good and Fair  Memory:  WNL  Fund of knowledge:   Good and Fair  Insight:    Good  Judgment:   Good  Impulse Control:  Good   Risk Assessment: Danger to Self:  No Self-injurious Behavior: No Danger to Others: No Duty to Warn:no Physical Aggression / Violence:No  Access to Firearms a concern: No  Gang Involvement:No   Subjective:   Patient working in session today further on her grief regarding the loss of her dog recently.  Her dog was like the child to her and this has been a difficult loss although she is showing some good strength through all of this.  Not easy but is definitely showing strength and working on her sadness and grief but also acknowledging her gratitude for having had him and her hopes eventually to adopt another dog. Continues processing some of her grief and recognizing what a gift she had in her dog over past few years prior to his recent death. Shared that she finds now that she can focus more on issues including some family challenges as she talked through some of those today.  More consistently open in talking today in session re: family differences and current  challenges and how to express her support of others but not get in the middle.  Patient noticeably being more caring of herself and her talking today and noting strategies that she can be working on to help support her through the loss of her dog and also in her wanting to move forward.  Interventions: Cognitive Behavioral Therapy, Solution-Oriented/Positive Psychology, and Ego-Supportive 1) Long Term Goal: Develop and follow through on strategies to reduce symptoms.     Short Term Goal: Reduce anxiety and improve coping skills.     Objective: Learn two new ways of coping with daily stressors.     Objective: Develop strategies for thought distraction when fixating on the future.     Objective: Manage panic episodes. 2) Long Term Goal: Maintain stability of mood     Short Term Goal: Increase ability to manage moods.     Objective: Discuss and resolve troubling personal and interpersonal issues.     Diagnosis:   ICD-10-CM   1. Bipolar 1 disorder, mixed, moderate (HCC)  F31.62      Plan:    Patient in session today further processing some of the grief regarding her dog's death recently but also noting the blessings she has had and having him with her.  Less self-critical today and less critical of others.  This was a positive and was very noticeable and we actually talked about it some in session  today.  Wants to look more for the positives and not overly focused on negatives, as she works more intentionally on her goals and being able to manage challenging situations and relationships and move forward. Therapist continues to encourage patient and working on her motivation and sense of being engaged with others as she works with goal-directed behaviors that are related to her being less negative, having more productive thought patterns, being more self understanding, practicing better limit setting with other people who are not healthy for her, refrain from jumping too quickly to conclusions about  situations and understand that she may not have all the facts involved, recognize her overthinking more quickly and work to interrupt it, try reducing frequent thoughts of how things have to be in order for her to be okay, more regular practice of the Pause, work on healthier interaction skills including good boundaries, let her faith be a resource of emotional support and stability as well as spiritual, recognize the strengths that she has in addition to her challenges, understand that her anxious thoughts are just thoughts and are not necessarily going to play out in reality the way she might assume or fear.  Work in therapy continues to include emphasizing some strengths that patient has rather than just focusing heavily on her limitations, recognizing that some of her thoughts are not helpful for her and she is needing to move forward and replace such thoughts with more positive and hopeful thoughts even though this is a challenge for patient.  Ishita Mcnerney does continue to work on having more structure and follow-through as she works on treatment goals, noticing some improvement while also seeing what she feels are negatives as she tries to move forward in a more hopeful and healthier direction into the future.  Goal review and progress/challenges noted with patient.  Next appointment within 2 weeks.   Barnie Bunde, LCSW                   "

## 2024-06-12 ENCOUNTER — Ambulatory Visit: Admitting: Family Medicine

## 2024-06-12 ENCOUNTER — Encounter: Payer: Self-pay | Admitting: Family Medicine

## 2024-06-12 VITALS — Wt 185.0 lb

## 2024-06-12 DIAGNOSIS — Z0000 Encounter for general adult medical examination without abnormal findings: Secondary | ICD-10-CM

## 2024-06-12 NOTE — Progress Notes (Signed)
 "  Chief Complaint  Patient presents with   Medicare Wellness     Subjective:   Brittany [name] is a 68 y.o. female who presents for a Medicare Annual Wellness Visit.  Visit info / Clinical Intake: Medicare Wellness Visit Type:: Subsequent Annual Wellness Visit Persons participating in visit and providing information:: patient Medicare Wellness Visit Mode:: Video Since this visit was completed virtually, some vitals may be partially provided or unavailable. Missing vitals are due to the limitations of the virtual format.: Unable to obtain vitals - no equipment If Telephone or Video please confirm:: I connected with patient using audio/video enable telemedicine. I verified patient identity with two identifiers, discussed telehealth limitations, and patient agreed to proceed. Patient Location:: home Provider Location:: office Interpreter Needed?: No Pre-visit prep was completed: yes AWV questionnaire completed by patient prior to visit?: yes Date:: 06/12/24 Living arrangements:: (!) (Patient-Rptd) lives alone Patient's Overall Health Status Rating: (Patient-Rptd) good Typical amount of pain: (!) (Patient-Rptd) a lot Does pain affect daily life?: (!) (Patient-Rptd) yes Are you currently prescribed opioids?: no  Dietary Habits and Nutritional Risks How many meals a day?: (Patient-Rptd) 2 Eats fruit and vegetables daily?: (!) (Patient-Rptd) no Most meals are obtained by: (Patient-Rptd) preparing own meals In the last 2 weeks, have you had any of the following?: none Diabetic:: no  Functional Status Activities of Daily Living (to include ambulation/medication): (Patient-Rptd) Independent Ambulation: (Patient-Rptd) Independent Medication Administration: (Patient-Rptd) Independent Home Management (perform basic housework or laundry): (Patient-Rptd) Independent Manage your own finances?: (Patient-Rptd) yes Primary transportation is: (Patient-Rptd) driving Concerns about vision?: no  *vision screening is required for WTM* Concerns about hearing?: no  Fall Screening Falls in the past year?: 0 Number of falls in past year: 0 Was there an injury with Fall?: 0 Fall Risk Category Calculator: 0 Patient Fall Risk Level: Low Fall Risk  Fall Risk Patient at Risk for Falls Due to: No Fall Risks Fall risk Follow up: Falls evaluation completed  Home and Transportation Safety: All rugs have non-skid backing?: (Patient-Rptd) yes All stairs or steps have railings?: (Patient-Rptd) N/A, no stairs Grab bars in the bathtub or shower?: (Patient-Rptd) yes Have non-skid surface in bathtub or shower?: (Patient-Rptd) yes Good home lighting?: (Patient-Rptd) yes Regular seat belt use?: (Patient-Rptd) yes Hospital stays in the last year:: (!) (Patient-Rptd) yes How many hospital stays:: (Patient-Rptd) 1  Cognitive Assessment Difficulty concentrating, remembering, or making decisions? : (Patient-Rptd) yes Will 6CIT or Mini Cog be Completed: no 6CIT or Mini Cog Declined: patient alert, oriented, able to answer questions appropriately and recall recent events  Advance Directives (For Healthcare) Does Patient Have a Medical Advance Directive?: Yes Does patient want to make changes to medical advance directive?: No - Patient declined Type of Advance Directive: Healthcare Power of Attorney Copy of Healthcare Power of Attorney in Chart?: Yes - validated most recent copy scanned in chart (See row information)  Reviewed/Updated  Reviewed/Updated: Reviewed All (Medical, Surgical, Family, Medications, Allergies, Care Teams, Patient Goals)    Allergies (verified) Aspirin , Atorvastatin , Codeine, Crestor  [rosuvastatin ], Gabapentin , Niacin and related, Sulfa antibiotics, Sulfamethoxazole-trimethoprim, Topiramate , Trileptal  [oxcarbazepine ], Trimethoprim, Ultram [tramadol], and Vioxx [rofecoxib]   Current Medications (verified) Outpatient Encounter Medications as of 06/12/2024  Medication Sig    acetaminophen  (TYLENOL ) 500 MG tablet Take 500-1,000 mg by mouth every 6 (six) hours as needed (pain.).   albuterol  (PROVENTIL ) (2.5 MG/3ML) 0.083% nebulizer solution Take 3 mLs (2.5 mg total) by nebulization every 6 (six) hours as needed for wheezing or shortness of breath.  Albuterol  Sulfate (PROAIR  RESPICLICK) 108 (90 Base) MCG/ACT AEPB Inhale 2 puffs into the lungs every 6 (six) hours as needed.   aluminum hydroxide-magnesium  carbonate (GAVISCON ) 95-358 MG/15ML SUSP Take 15-30 mLs by mouth as needed.   Azelastine  HCl 137 MCG/SPRAY SOLN SPRAY 1 SPRAY IN EACH NOSTRIL TWICE DAILY   busPIRone  (BUSPAR ) 15 MG tablet Take 1 tablet (15 mg total) by mouth 3 (three) times daily.   cetirizine  (ZYRTEC ) 10 MG tablet Take 1 tablet (10 mg total) by mouth daily.   Cholecalciferol (VITAMIN D -3 PO) Take 5,000 Units by mouth daily.   clonazePAM  (KLONOPIN ) 0.5 MG tablet TAKE 1 TABLET BY MOUTH 2 TIMES A DAY AND TAKE 2 TABLETS BY MOUTH AT BEDTIME (Patient taking differently: 1 in the Am and 1/2  in the afternoon and 1 and 1/2 tablets at night)   Dextromethorphan -buPROPion  ER (AUVELITY ) 45-105 MG TBCR Take 1 tablet by mouth 2 (two) times daily.   dicyclomine  (BENTYL ) 10 MG capsule Take 1 capsule (10 mg total) by mouth as needed for spasms.   doxazosin  (CARDURA ) 4 MG tablet Take 2 tablets (8 mg total) by mouth at bedtime.   Evolocumab  (REPATHA  SURECLICK) 140 MG/ML SOAJ Inject 140 mg into the skin every 14 (fourteen) days.   ezetimibe  (ZETIA ) 10 MG tablet Take 1 tablet (10 mg total) by mouth daily.   famotidine  (PEPCID ) 20 MG tablet Take 1 tablet (20 mg total) by mouth 2 (two) times daily.   fluticasone  (FLONASE ) 50 MCG/ACT nasal spray SPRAY 2 SPRAYS IN EACH NOSTRIL 2 TIMES A DAY   fluticasone -salmeterol (ADVAIR) 250-50 MCG/ACT AEPB Inhale 1 puff into the lungs every 12 (twelve) hours.   ibuprofen (ADVIL) 200 MG tablet Take 200-400 mg by mouth every 8 (eight) hours as needed (pain.).   MAGNESIUM  PO Take 1 tablet by  mouth daily.   mirtazapine  (REMERON ) 45 MG tablet Take 1 tablet (45 mg total) by mouth at bedtime.   montelukast  (SINGULAIR ) 10 MG tablet TAKE 1 TABLET BY MOUTH AT BEDTIME   Multiple Vitamin (MULTIVITAMIN WITH MINERALS) TABS tablet Take 1 tablet by mouth daily with breakfast.   neomycin  (MYCIFRADIN ) 500 MG tablet Take 1 tablet (500 mg total) by mouth 2 (two) times daily.   pantoprazole  (PROTONIX ) 40 MG tablet Take 1 tablet (40 mg total) by mouth 2 (two) times daily. Take 30 min to 1 hr before morning coffee and dinner.   Scar Treatment Products (SCAR EX) Apply 1 Application topically as needed (bruises).   sodium chloride  (OCEAN) 0.65 % SOLN nasal spray Place 1 spray into both nostrils in the morning and at bedtime.   [DISCONTINUED] Ascorbic Acid  (VITA-C PO) Take 1 tablet by mouth daily.   [DISCONTINUED] docusate sodium  (COLACE) 100 MG capsule Take 1 capsule (100 mg total) by mouth 2 (two) times daily.   [DISCONTINUED] Ferrous Sulfate  (IRON PO) Take 1 tablet by mouth daily.   [DISCONTINUED] methocarbamol  (ROBAXIN ) 500 MG tablet Take 1 tablet (500 mg total) by mouth every 8 (eight) hours as needed for muscle spasms.   [DISCONTINUED] Omega-3 Fatty Acids (OMEGA 3 PO) Take 1 capsule by mouth daily.   [DISCONTINUED] polyethylene glycol (MIRALAX  / GLYCOLAX ) 17 g packet Take 17 g by mouth daily.   [DISCONTINUED] Vitamin D -Vitamin K (D3 + K2 PO) Take 1 tablet by mouth daily.   No facility-administered encounter medications on file as of 06/12/2024.    History: Past Medical History:  Diagnosis Date   Abnormal EKG    Allergy    Anxiety  Arthritis    Asthma    Bipolar depression (HCC), Anxiety, PTSD, Panic disorder    -managed by [facility]   CAD (coronary artery disease)    COPD (chronic obstructive pulmonary disease) (HCC)    Depression    Foot pain    Gallstones    GERD (gastroesophageal reflux disease)    Heart murmur    as a child   HLD (hyperlipidemia)    Insomnia     Leg swelling    Obesity    Osteoporosis    osteopenia   Pneumonia due to COVID-19 virus 04/22/2019   Tachycardia    Past Surgical History:  Procedure Laterality Date   BREAST BIOPSY Right    biopsy of nipple   CARPAL TUNNEL RELEASE Left    CESAREAN SECTION     x 2   CHOLECYSTECTOMY     COLONOSCOPY     ENDOMETRIAL ABLATION     EXCISION MORTON'S NEUROMA Left 01/11/2022   Procedure: SECOND WEBSPACE MORTON'S NEUROMA EXCISION;  Surgeon: Kit Rush, MD;  Location: [facility];  Service: Orthopedics;  Laterality: Left;   FOOT SURGERY Left    HAMMER TOE SURGERY Left 01/11/2022   Procedure: COLLATERAL LIGAMENT REPAIRS WITH SECOND AND THIRD HAMMER TOE CORRECTION;  Surgeon: Kit Rush, MD;  Location: [facility];  Service: Orthopedics;  Laterality: Left;   KNEE SURGERY Right    reconstruction for patellar dislocation   LAPAROSCOPY     x 2   TOENAIL EXCISION Left 01/11/2022   Procedure: HALLUX TOENAIL PERMANENT EXCISION;  Surgeon: Kit Rush, MD;  Location: [facility];  Service: Orthopedics;  Laterality: Left;   TOTAL KNEE ARTHROPLASTY Right 12/25/2023   Procedure: ARTHROPLASTY, KNEE, TOTAL;  Surgeon: Duwayne Purchase, MD;  Location: [facility];  Service: Orthopedics;  Laterality: Right;   TUBAL LIGATION     ULNAR NERVE REPAIR Left    UPPER GASTROINTESTINAL ENDOSCOPY     WEIL OSTEOTOMY Left 01/11/2022   Procedure: SECOND, THIRD AND FOURTH WEIL OSTEOTOMIES;  Surgeon: Kit Rush, MD;  Location: [facility];  Service: Orthopedics;  Laterality: Left;   Family History  Problem Relation Age of Onset   Hypertension Mother    Hyperlipidemia Mother    Congestive Heart Failure Mother    Other Father        killed   COPD Brother    Heart disease Brother    Pulmonary embolism Brother    Colon polyps Maternal Aunt    COPD Paternal Aunt        a lot of aunts and uncle COPD or Emphysema   Emphysema Paternal Uncle    Breast cancer  Daughter    Colon cancer Neg Hx    Esophageal cancer Neg Hx    Pancreatic cancer Neg Hx    Stomach cancer Neg Hx    Liver disease Neg Hx    Rectal cancer Neg Hx    Social History   Occupational History   Occupation: disabled  Tobacco Use   Smoking status: Former    Current packs/day: 0.00    Average packs/day: 20.0 packs/day for 1 year (20.0 ttl pk-yrs)    Types: Cigarettes    Start date: 06/18/1998    Quit date: 06/19/1999    Years since quitting: 25.0   Smokeless tobacco: Never   Tobacco comments:    20 year estimate but probably less   Vaping Use   Vaping status: Never Used  Substance and Sexual Activity  Alcohol use: Yes    Comment: Rare   Drug use: No   Sexual activity: Never   Tobacco Counseling Counseling given: Not Answered Tobacco comments: 20 year estimate but probably less   SDOH Screenings   Food Insecurity: Food Insecurity Present (05/29/2024)  Housing: Low Risk (05/29/2024)  Transportation Needs: Unknown (05/29/2024)  Utilities: At Risk (06/12/2024)  Alcohol Screen: Low Risk (05/29/2024)  Depression (PHQ2-9): High Risk (06/12/2024)  Financial Resource Strain: High Risk (05/29/2024)  Physical Activity: Unknown (06/12/2024)  Social Connections: Moderately Integrated (06/12/2024)  Recent Concern: Social Connections - Moderately Isolated (05/29/2024)  Stress: Stress Concern Present (06/12/2024)  Tobacco Use: Medium Risk (06/12/2024)  Health Literacy: Adequate Health Literacy (06/12/2024)   See flowsheets for full screening details  Depression Screen PHQ 2 & 9 Depression Scale- Over the past 2 weeks, how often have you been bothered by any of the following problems? Little interest or pleasure in doing things: 3 Feeling down, depressed, or hopeless (PHQ Adolescent also includes...irritable): 3 PHQ-2 Total Score: 6 Trouble falling or staying asleep, or sleeping too much: 1 Feeling tired or having little energy: 3 Poor appetite or overeating (PHQ  Adolescent also includes...weight loss): 1 Feeling bad about yourself - or that you are a failure or have let yourself or your family down: 1 Trouble concentrating on things, such as reading the newspaper or watching television (PHQ Adolescent also includes...like school work): 3 Moving or speaking so slowly that other people could have noticed. Or the opposite - being so fidgety or restless that you have been moving around a lot more than usual: 0 Thoughts that you would be better off dead, or of hurting yourself in some way: 0 PHQ-9 Total Score: 15     Goals Addressed   None          Objective:    Today's Vitals   06/12/24 0928  Weight: 185 lb (83.9 kg)   Body mass index is 34.96 kg/m.  Hearing/Vision screen No results found. Immunizations and Health Maintenance Health Maintenance  Topic Date Due   COVID-19 Vaccine (9 - Pfizer risk 2025-26 season) 09/06/2024   Medicare Annual Wellness (AWV)  06/12/2025   Mammogram  12/12/2025   Bone Density Scan  12/23/2025   Colonoscopy  09/12/2026   DTaP/Tdap/Td (2 - Td or Tdap) 12/05/2026   Pneumococcal Vaccine: 50+ Years  Completed   Influenza Vaccine  Completed   Hepatitis C Screening  Completed   Zoster Vaccines- Shingrix  Completed   Meningococcal B Vaccine  Aged Out        Assessment/Plan:  This is a routine wellness examination for Brittany [name].  Patient Care Team: Ozell Heron HERO, MD as PCP - General (Family Medicine) Delford Maude BROCKS, MD as PCP - Cardiology (Cardiology)  I have personally reviewed and noted the following in the patients chart:   Medical and social history Use of alcohol, tobacco or illicit drugs  Current medications and supplements including opioid prescriptions. Functional ability and status Nutritional status Physical activity Advanced directives List of other physicians Hospitalizations, surgeries, and ER visits in previous 12 months Vitals Screenings to include cognitive, depression, and  falls Referrals and appointments  No orders of the defined types were placed in this encounter.  In addition, I have reviewed and discussed with patient certain preventive protocols, quality metrics, and best practice recommendations. A written personalized care plan for preventive services as well as general preventive health recommendations were provided to patient.   Heron HERO Ozell, MD  06/12/2024   No follow-ups on file.  After Visit Summary: (MyChart) Due to this being a telephonic visit, the after visit summary with patients personalized plan was offered to patient via MyChart   Nurse Notes: N/A "

## 2024-06-16 ENCOUNTER — Other Ambulatory Visit: Payer: Self-pay | Admitting: Gastroenterology

## 2024-06-16 ENCOUNTER — Other Ambulatory Visit: Payer: Self-pay | Admitting: Psychiatry

## 2024-06-16 DIAGNOSIS — F4310 Post-traumatic stress disorder, unspecified: Secondary | ICD-10-CM

## 2024-06-19 ENCOUNTER — Ambulatory Visit: Admitting: Psychiatry

## 2024-06-19 NOTE — Progress Notes (Deleted)
 "       [facility] Counselor/Therapist Progress Note  Patient ID: Brittany [name], MRN: [id number],    Date: 06/19/2024  Time Spent: ***   Treatment Type: {CHL AMB THERAPY [phone]}  Reported Symptoms: ***   Grief, bipolar, anxiety, depression some worse and trying new med., overthinking, very sensitive to perceived rejection       Mental Status Exam:  Appearance:   {PSY:22683}     Behavior:  {PSY:21022743}  Motor:  {PSY:22302}  Speech/Language:   {PSY:22685}  Affect:  {PSY:22687}  Mood:  {PSY:31886}  Thought process:  {PSY:31888}  Thought content:    {[phone]}  Sensory/Perceptual disturbances:    {PSY:[phone]}  Orientation:  {PSY:30297}  Attention:  {PSY:22877}  Concentration:  {[phone]}  Memory:  {[phone]}  Fund of knowledge:   {[phone]}  Insight:    {[phone]}  Judgment:   {[phone]}  Impulse Control:  {[phone]}   Risk Assessment: Danger to Self:  {PSY:22692} Self-injurious Behavior: {PSY:22692} Danger to Others: {PSY:22692} Duty to Warn:{PSY:311194} Physical Aggression / Violence:{PSY:21197} Access to Firearms a concern: {PSY:21197} Gang Involvement:{PSY:21197}  Subjective: ***      Patient working in session today further on her grief regarding the loss of her dog recently. Her dog was like the child to her and this has been a difficult loss although she is showing some good strength through all of this. Not easy but is definitely showing strength and working on her sadness and grief but also acknowledging her gratitude for having had him and her hopes eventually to adopt another dog. Continues processing some of her grief and recognizing what a gift she had in her dog over past few years prior to his recent death. Shared that she finds now that she can focus more on issues including some family challenges as she talked through some of those today. More consistently open in talking today in session re:  family differences and current challenges and how to express her support of others but not get in the middle. Patient noticeably being more caring of herself and her talking today and noting strategies that she can be working on to help support her through the loss of her dog and also in her wanting to move forward.     Interventions: {PSY:[phone]} 1) Long Term Goal: Develop and follow through on strategies to reduce symptoms.     Short Term Goal: Reduce anxiety and improve coping skills.     Objective: Learn two new ways of coping with daily stressors.     Objective: Develop strategies for thought distraction when fixating on the future.     Objective: Manage panic episodes. 2) Long Term Goal: Maintain stability of mood     Short Term Goal: Increase ability to manage moods.     Objective: Discuss and resolve troubling personal and interpersonal issues.     Diagnosis:No diagnosis found.       Plan: ***       Patient in session today further processing some of the grief regarding her dog's death recently but also noting the blessings she has had and having him with her.  Less self-critical today and less critical of others.  This was a positive and was very noticeable and we actually talked about it some in session today.  Wants to look more for the positives and not overly focused on negatives, as she works more intentionally on her goals and being able to manage challenging situations and relationships and move forward. Therapist continues to encourage patient  and working on her motivation and sense of being engaged with others as she works with goal-directed behaviors that are related to her being less negative, having more productive thought patterns, being more self understanding, practicing better limit setting with other people who are not healthy for her, refrain from jumping too quickly to conclusions about situations and understand that she may not have all the facts  involved, recognize her overthinking more quickly and work to interrupt it, try reducing frequent thoughts of how things have to be in order for her to be okay, more regular practice of the Pause, work on healthier interaction skills including good boundaries, let her faith be a resource of emotional support and stability as well as spiritual, recognize the strengths that she has in addition to her challenges, understand that her anxious thoughts are just thoughts and are not necessarily going to play out in reality the way she might assume or fear.  Work in therapy continues to include emphasizing some strengths that patient has rather than just focusing heavily on her limitations, recognizing that some of her thoughts are not helpful for her and she is needing to move forward and replace such thoughts with more positive and hopeful thoughts even though this is a challenge for patient.  Brittany [name] does continue to work on having more structure and follow-through as she works on treatment goals, noticing some improvement while also seeing what she feels are negatives as she tries to move forward in a more hopeful and healthier direction into the future.    //////////////////////////////////////////////////////////////////////////////////////////////////     Goal review and progress/challenges noted with patient.  Next appointment within 2 weeks.   Barnie Bunde, LCSW                   "

## 2024-06-19 NOTE — Progress Notes (Signed)
"   °     °  Patient had to cancel appointment due to illness.  No charge to patient. "

## 2024-06-22 ENCOUNTER — Other Ambulatory Visit (HOSPITAL_COMMUNITY): Payer: Self-pay

## 2024-07-03 ENCOUNTER — Ambulatory Visit: Admitting: Psychiatry

## 2024-07-03 DIAGNOSIS — F3162 Bipolar disorder, current episode mixed, moderate: Secondary | ICD-10-CM

## 2024-07-03 NOTE — Progress Notes (Signed)
"   °      [facility] Counselor/Therap[facility] Progress Note  Patient ID: Brittany [name], MRN: [id number],    Date: 07/03/2024  Time Spent: 55 minutes  Treatment Type: Individual Therapy  Reported Symptoms: Bipolar, grief, anxiety, some depression, overthinking, and very sensitive to perceived rejection; Some decrease in depression and anxiety and also feeling her recent med change is helping. Following up on thinking in more helpful and positive ways.   Mental Status Exam:  Appearance:   Casual and Neat     Behavior:  Appropriate, Sharing, and Motivated  Motor:  Normal  Speech/Language:   Clear and Coherent  Affect:  Depressed and anxious  Mood:  anxious and depressed  Thought process:  goal directed  Thought content:    WNL  Sensory/Perceptual disturbances:    WNL  Orientation:  oriented to person, place, time/date, situation, day of week, month of year, year, and stated date of Jan. 16, 2026  Attention:  Good  Concentration:  Good and Fair  Memory:  WNL  Fund of knowledge:   Good and Fair  Insight:    Good and Fair  Judgment:   Good  Impulse Control:  Good and Fair   Risk Assessment: Danger to Self:  No Self-injurious Behavior: No Danger to Others: No Duty to Warn:no Physical Aggression / Violence:No  Access to Firearms a concern: No  Gang Involvement:No   Subjective: Patient today in session feeling some better about herself and is showing more motivation in her own self-care which she is feeling good about. Some increased interaction with others. Healing more from the grief over loss of dog a few weeks ago, and did process this more today. Also talking through some of her progress and new goal areas in which she wants to work in 2026, particularly improved self-care. Wanting to start losing some weight in the new year. To get new dentures in February and happy about that. Working on saving and spending money with more forethought, as she tries to budget her money. Trying to stay  in more contact with some family members and be more involved. Mood is noticeably better.  Feels the med addition of Auvelity  is helping her.   Interventions: Cognitive Behavioral Therapy, Solution-Oriented/Positive Psychology, and Ego-Supportive 1. Develop and follow through on strategies to reduce symptoms of anxiety, coping more effectively with daily stressors. 2. Work with strategies to better manage some thought distraction when fixating on the future.  3. Better manage anxiety/panic episodes. 4. Aim to have more stability of mood. Increase ability to manage moods.   Diagnosis:   ICD-10-CM   1. Bipolar 1 disorder, mixed, moderate (HCC)  F31.62      Plan:   Patient working in session today and showing better and more-sustained motivation in session today. Not focusing just on the negatives as she also is noticing the positives, and having some more patience with herself. She states she is also more patient with other people at times, still frustrated often but trying to deal with it more effectively.  Goal review and progress/challenges noted with patient.  Return appt within 2 weeks.   Barnie Bunde, LCSW                   "

## 2024-07-17 ENCOUNTER — Ambulatory Visit: Admitting: Psychiatry

## 2024-07-22 ENCOUNTER — Other Ambulatory Visit: Payer: Self-pay | Admitting: Pulmonary Disease

## 2024-07-22 ENCOUNTER — Other Ambulatory Visit: Payer: Self-pay | Admitting: Cardiovascular Disease

## 2024-07-22 ENCOUNTER — Other Ambulatory Visit: Payer: Self-pay | Admitting: Psychiatry

## 2024-07-22 DIAGNOSIS — F5105 Insomnia due to other mental disorder: Secondary | ICD-10-CM

## 2024-07-22 DIAGNOSIS — F4001 Agoraphobia with panic disorder: Secondary | ICD-10-CM

## 2024-07-22 DIAGNOSIS — F4312 Post-traumatic stress disorder, chronic: Secondary | ICD-10-CM

## 2024-07-22 DIAGNOSIS — F4310 Post-traumatic stress disorder, unspecified: Secondary | ICD-10-CM

## 2024-07-22 NOTE — Telephone Encounter (Signed)
 Too early

## 2024-08-12 ENCOUNTER — Ambulatory Visit: Admitting: Psychiatry

## 2024-08-14 ENCOUNTER — Ambulatory Visit: Admitting: Psychiatry

## 2024-12-01 ENCOUNTER — Encounter: Admitting: Family Medicine
